# Patient Record
Sex: Male | Born: 1935
Health system: Southern US, Community
[De-identification: ages and names within clinical notes are randomized; demographics above are authoritative.]

## PROBLEM LIST (undated history)

## (undated) DIAGNOSIS — I509 Heart failure, unspecified: Secondary | ICD-10-CM

## (undated) DIAGNOSIS — J189 Pneumonia, unspecified organism: Secondary | ICD-10-CM

## (undated) DIAGNOSIS — I2510 Atherosclerotic heart disease of native coronary artery without angina pectoris: Secondary | ICD-10-CM

## (undated) DIAGNOSIS — J449 Chronic obstructive pulmonary disease, unspecified: Secondary | ICD-10-CM

## (undated) DIAGNOSIS — Z87891 Personal history of nicotine dependence: Secondary | ICD-10-CM

## (undated) DIAGNOSIS — I5032 Chronic diastolic (congestive) heart failure: Secondary | ICD-10-CM

## (undated) DIAGNOSIS — N400 Enlarged prostate without lower urinary tract symptoms: Secondary | ICD-10-CM

## (undated) DIAGNOSIS — J9611 Chronic respiratory failure with hypoxia: Secondary | ICD-10-CM

## (undated) DIAGNOSIS — R609 Edema, unspecified: Secondary | ICD-10-CM

## (undated) DIAGNOSIS — M1990 Unspecified osteoarthritis, unspecified site: Secondary | ICD-10-CM

## (undated) DIAGNOSIS — N189 Chronic kidney disease, unspecified: Secondary | ICD-10-CM

## (undated) DIAGNOSIS — F0390 Unspecified dementia without behavioral disturbance: Secondary | ICD-10-CM

## (undated) DIAGNOSIS — E782 Mixed hyperlipidemia: Secondary | ICD-10-CM

## (undated) DIAGNOSIS — G4730 Sleep apnea, unspecified: Secondary | ICD-10-CM

## (undated) DIAGNOSIS — I10 Essential (primary) hypertension: Secondary | ICD-10-CM

## (undated) HISTORY — DX: Mixed hyperlipidemia: E78.2

## (undated) HISTORY — DX: Edema, unspecified: R60.9

## (undated) HISTORY — DX: Chronic obstructive pulmonary disease, unspecified: J44.9

## (undated) HISTORY — DX: Benign prostatic hyperplasia without lower urinary tract symptoms: N40.0

## (undated) HISTORY — DX: Essential (primary) hypertension: I10

## (undated) HISTORY — DX: Personal history of nicotine dependence: Z87.891

## (undated) HISTORY — DX: Unspecified osteoarthritis, unspecified site: M19.90

## (undated) HISTORY — DX: Chronic kidney disease, unspecified: N18.9

## (undated) HISTORY — PX: ANGIOPLASTY: SHX39

## (undated) HISTORY — PX: CARDIAC CATHETERIZATION: SHX172

## (undated) HISTORY — DX: Chronic respiratory failure with hypoxia: J96.11

## (undated) HISTORY — DX: Chronic diastolic (congestive) heart failure: I50.32

## (undated) HISTORY — DX: Unspecified dementia, unspecified severity, without behavioral disturbance, psychotic disturbance, mood disturbance, and anxiety: F03.90

## (undated) HISTORY — DX: Sleep apnea, unspecified: G47.30

## (undated) HISTORY — DX: Atherosclerotic heart disease of native coronary artery without angina pectoris: I25.10

---

## 1999-11-17 ENCOUNTER — Other Ambulatory Visit: Admission: RE | Admit: 1999-11-17 | Discharge: 1999-11-17 | Payer: Self-pay | Admitting: Gastroenterology

## 1999-11-17 ENCOUNTER — Encounter (INDEPENDENT_AMBULATORY_CARE_PROVIDER_SITE_OTHER): Payer: Self-pay | Admitting: Specialist

## 2003-04-11 HISTORY — PX: ROTATOR CUFF REPAIR: SHX139

## 2003-09-22 ENCOUNTER — Observation Stay (HOSPITAL_COMMUNITY): Admission: RE | Admit: 2003-09-22 | Discharge: 2003-09-23 | Payer: Self-pay | Admitting: Orthopedic Surgery

## 2004-03-15 ENCOUNTER — Ambulatory Visit: Payer: Self-pay | Admitting: Internal Medicine

## 2005-01-30 ENCOUNTER — Ambulatory Visit: Payer: Self-pay | Admitting: Internal Medicine

## 2005-02-08 HISTORY — PX: CORONARY ARTERY BYPASS GRAFT: SHX141

## 2005-03-01 ENCOUNTER — Ambulatory Visit: Payer: Self-pay | Admitting: Internal Medicine

## 2005-03-03 ENCOUNTER — Inpatient Hospital Stay (HOSPITAL_COMMUNITY): Admission: EM | Admit: 2005-03-03 | Discharge: 2005-03-13 | Payer: Self-pay | Admitting: Emergency Medicine

## 2005-03-06 ENCOUNTER — Ambulatory Visit: Payer: Self-pay | Admitting: Cardiology

## 2005-03-29 ENCOUNTER — Ambulatory Visit: Payer: Self-pay | Admitting: Internal Medicine

## 2005-04-11 ENCOUNTER — Encounter: Payer: Self-pay | Admitting: Internal Medicine

## 2005-04-19 ENCOUNTER — Emergency Department (HOSPITAL_COMMUNITY): Admission: EM | Admit: 2005-04-19 | Discharge: 2005-04-20 | Payer: Self-pay | Admitting: Emergency Medicine

## 2005-05-04 ENCOUNTER — Encounter (HOSPITAL_COMMUNITY): Admission: RE | Admit: 2005-05-04 | Discharge: 2005-08-02 | Payer: Self-pay | Admitting: Cardiology

## 2005-05-24 ENCOUNTER — Ambulatory Visit: Payer: Self-pay | Admitting: Internal Medicine

## 2005-05-26 ENCOUNTER — Ambulatory Visit: Payer: Self-pay | Admitting: Internal Medicine

## 2005-08-08 DIAGNOSIS — J432 Centrilobular emphysema: Secondary | ICD-10-CM

## 2005-08-14 ENCOUNTER — Ambulatory Visit: Payer: Self-pay | Admitting: Internal Medicine

## 2005-08-15 ENCOUNTER — Ambulatory Visit: Payer: Self-pay | Admitting: Internal Medicine

## 2005-08-24 ENCOUNTER — Ambulatory Visit: Payer: Self-pay | Admitting: Internal Medicine

## 2005-09-05 ENCOUNTER — Ambulatory Visit: Payer: Self-pay | Admitting: Internal Medicine

## 2005-09-06 ENCOUNTER — Ambulatory Visit: Payer: Self-pay | Admitting: Emergency Medicine

## 2005-09-08 ENCOUNTER — Ambulatory Visit: Payer: Self-pay | Admitting: Cardiology

## 2005-09-15 ENCOUNTER — Ambulatory Visit: Payer: Self-pay | Admitting: Emergency Medicine

## 2005-09-26 ENCOUNTER — Encounter: Payer: Self-pay | Admitting: Internal Medicine

## 2005-10-03 ENCOUNTER — Ambulatory Visit: Payer: Self-pay | Admitting: Emergency Medicine

## 2005-10-05 ENCOUNTER — Ambulatory Visit: Payer: Self-pay | Admitting: Internal Medicine

## 2006-01-03 ENCOUNTER — Ambulatory Visit: Payer: Self-pay | Admitting: Internal Medicine

## 2006-01-08 ENCOUNTER — Ambulatory Visit: Payer: Self-pay | Admitting: Internal Medicine

## 2006-02-06 ENCOUNTER — Ambulatory Visit: Payer: Self-pay | Admitting: Internal Medicine

## 2006-05-16 ENCOUNTER — Ambulatory Visit: Payer: Self-pay | Admitting: Internal Medicine

## 2006-06-04 ENCOUNTER — Ambulatory Visit: Payer: Self-pay | Admitting: Internal Medicine

## 2006-06-12 ENCOUNTER — Ambulatory Visit: Payer: Self-pay | Admitting: Cardiology

## 2006-06-18 ENCOUNTER — Ambulatory Visit: Payer: Self-pay | Admitting: Internal Medicine

## 2006-07-06 DIAGNOSIS — E7800 Pure hypercholesterolemia, unspecified: Secondary | ICD-10-CM | POA: Insufficient documentation

## 2006-07-06 DIAGNOSIS — I10 Essential (primary) hypertension: Secondary | ICD-10-CM

## 2006-07-06 DIAGNOSIS — F068 Other specified mental disorders due to known physiological condition: Secondary | ICD-10-CM | POA: Insufficient documentation

## 2006-07-06 DIAGNOSIS — N400 Enlarged prostate without lower urinary tract symptoms: Secondary | ICD-10-CM | POA: Insufficient documentation

## 2006-07-06 DIAGNOSIS — H9190 Unspecified hearing loss, unspecified ear: Secondary | ICD-10-CM | POA: Insufficient documentation

## 2006-07-31 ENCOUNTER — Ambulatory Visit: Payer: Self-pay | Admitting: Internal Medicine

## 2006-07-31 LAB — CONVERTED CEMR LAB
ALT: 20 units/L (ref 0–40)
AST: 16 units/L (ref 0–37)
Albumin: 3.4 g/dL — ABNORMAL LOW (ref 3.5–5.2)
Alkaline Phosphatase: 75 units/L (ref 39–117)
BUN: 25 mg/dL — ABNORMAL HIGH (ref 6–23)
Basophils Absolute: 0 10*3/uL (ref 0.0–0.1)
Basophils Relative: 0.5 % (ref 0.0–1.0)
Bilirubin, Direct: 0.1 mg/dL (ref 0.0–0.3)
CO2: 32 meq/L (ref 19–32)
Calcium: 9.7 mg/dL (ref 8.4–10.5)
Chloride: 107 meq/L (ref 96–112)
Cholesterol: 167 mg/dL (ref 0–200)
Creatinine, Ser: 1.1 mg/dL (ref 0.4–1.5)
Eosinophils Absolute: 0.4 10*3/uL (ref 0.0–0.6)
Eosinophils Relative: 5.4 % — ABNORMAL HIGH (ref 0.0–5.0)
GFR calc Af Amer: 84 mL/min
GFR calc non Af Amer: 69 mL/min
Glucose, Bld: 129 mg/dL — ABNORMAL HIGH (ref 70–99)
HCT: 37 % — ABNORMAL LOW (ref 39.0–52.0)
HDL: 33.5 mg/dL — ABNORMAL LOW (ref >39.0)
Hemoglobin: 12.4 g/dL — ABNORMAL LOW (ref 13.0–17.0)
Hgb A1c MFr Bld: 8.5 % — ABNORMAL HIGH (ref 4.6–6.0)
LDL Cholesterol: 104 mg/dL — ABNORMAL HIGH (ref 0–99)
Lymphocytes Relative: 22.5 % (ref 12.0–46.0)
MCHC: 33.6 g/dL (ref 30.0–36.0)
MCV: 89.1 fL (ref 78.0–100.0)
Monocytes Absolute: 0.6 10*3/uL (ref 0.2–0.7)
Monocytes Relative: 9.1 % (ref 3.0–11.0)
Neutro Abs: 4.4 10*3/uL (ref 1.4–7.7)
Neutrophils Relative %: 62.5 % (ref 43.0–77.0)
Phosphorus: 3 mg/dL (ref 2.3–4.6)
Platelets: 235 10*3/uL (ref 150–400)
Potassium: 4.9 meq/L (ref 3.5–5.1)
RBC: 4.15 M/uL — ABNORMAL LOW (ref 4.22–5.81)
RDW: 13.2 % (ref 11.5–14.6)
Sed Rate: 87 mm/hr — ABNORMAL HIGH (ref 0–20)
Sodium: 142 meq/L (ref 135–145)
TSH: 1.17 microintl units/mL (ref 0.35–5.50)
Total Bilirubin: 0.4 mg/dL (ref 0.3–1.2)
Total CHOL/HDL Ratio: 5
Total CK: 51 units/L (ref 7–195)
Total Protein: 6.8 g/dL (ref 6.0–8.3)
Triglycerides: 148 mg/dL (ref 0–149)
VLDL: 30 mg/dL (ref 0–40)
WBC: 7 10*3/uL (ref 4.5–10.5)

## 2006-08-03 ENCOUNTER — Ambulatory Visit: Payer: Self-pay | Admitting: Internal Medicine

## 2006-08-03 LAB — CONVERTED CEMR LAB
Basophils Absolute: 0 10*3/uL (ref 0.0–0.1)
Basophils Relative: 0.5 % (ref 0.0–1.0)
Eosinophils Absolute: 0.4 10*3/uL (ref 0.0–0.6)
Eosinophils Relative: 5.1 % — ABNORMAL HIGH (ref 0.0–5.0)
Ferritin: 294.7 ng/mL (ref 22.0–322.0)
Folate: 9.6 ng/mL
HCT: 38.4 % — ABNORMAL LOW (ref 39.0–52.0)
Hemoglobin: 13 g/dL (ref 13.0–17.0)
Iron: 71 ug/dL (ref 42–165)
Lymphocytes Relative: 24.6 % (ref 12.0–46.0)
MCHC: 33.9 g/dL (ref 30.0–36.0)
MCV: 88.1 fL (ref 78.0–100.0)
Monocytes Absolute: 0.7 10*3/uL (ref 0.2–0.7)
Monocytes Relative: 9.7 % (ref 3.0–11.0)
Neutro Abs: 4.6 10*3/uL (ref 1.4–7.7)
Neutrophils Relative %: 60.1 % (ref 43.0–77.0)
Platelets: 246 10*3/uL (ref 150–400)
RBC: 4.36 M/uL (ref 4.22–5.81)
RDW: 13.8 % (ref 11.5–14.6)
Saturation Ratios: 26.1 % (ref 20.0–50.0)
Transferrin: 194.3 mg/dL — ABNORMAL LOW (ref >=212.0)
Vitamin B-12: 230 pg/mL (ref 211–911)
WBC: 7.5 10*3/uL (ref 4.5–10.5)

## 2006-08-06 ENCOUNTER — Encounter: Payer: Self-pay | Admitting: Cardiology

## 2006-08-06 ENCOUNTER — Ambulatory Visit: Payer: Self-pay

## 2006-08-30 ENCOUNTER — Ambulatory Visit: Payer: Self-pay | Admitting: Internal Medicine

## 2006-09-07 ENCOUNTER — Ambulatory Visit: Payer: Self-pay | Admitting: Emergency Medicine

## 2006-09-18 ENCOUNTER — Ambulatory Visit: Payer: Self-pay | Admitting: Pulmonary Disease

## 2006-09-25 ENCOUNTER — Ambulatory Visit: Payer: Self-pay | Admitting: Pulmonary Disease

## 2006-10-03 ENCOUNTER — Ambulatory Visit: Payer: Self-pay | Admitting: Emergency Medicine

## 2006-10-18 ENCOUNTER — Encounter (INDEPENDENT_AMBULATORY_CARE_PROVIDER_SITE_OTHER): Payer: Self-pay | Admitting: *Deleted

## 2006-11-23 ENCOUNTER — Telehealth (INDEPENDENT_AMBULATORY_CARE_PROVIDER_SITE_OTHER): Payer: Self-pay | Admitting: *Deleted

## 2006-12-24 ENCOUNTER — Ambulatory Visit: Payer: Self-pay | Admitting: Internal Medicine

## 2007-01-30 DIAGNOSIS — Z87891 Personal history of nicotine dependence: Secondary | ICD-10-CM

## 2007-01-30 DIAGNOSIS — J309 Allergic rhinitis, unspecified: Secondary | ICD-10-CM | POA: Insufficient documentation

## 2007-05-10 ENCOUNTER — Ambulatory Visit: Payer: Self-pay | Admitting: Internal Medicine

## 2007-05-10 DIAGNOSIS — I2510 Atherosclerotic heart disease of native coronary artery without angina pectoris: Secondary | ICD-10-CM | POA: Insufficient documentation

## 2007-05-14 ENCOUNTER — Telehealth: Payer: Self-pay | Admitting: Internal Medicine

## 2007-05-23 ENCOUNTER — Ambulatory Visit: Payer: Self-pay | Admitting: Emergency Medicine

## 2007-05-23 ENCOUNTER — Encounter: Payer: Self-pay | Admitting: Adult Health

## 2007-05-23 ENCOUNTER — Ambulatory Visit: Payer: Self-pay | Admitting: Pulmonary Disease

## 2007-07-02 ENCOUNTER — Ambulatory Visit: Payer: Self-pay | Admitting: Cardiology

## 2007-07-05 ENCOUNTER — Ambulatory Visit: Payer: Self-pay | Admitting: Emergency Medicine

## 2007-07-10 ENCOUNTER — Ambulatory Visit: Payer: Self-pay | Admitting: Internal Medicine

## 2007-07-10 ENCOUNTER — Ambulatory Visit: Payer: Self-pay | Admitting: Cardiology

## 2007-07-10 LAB — CONVERTED CEMR LAB
BUN: 21 mg/dL (ref 6–23)
CO2: 28 meq/L (ref 19–32)
Calcium: 9.2 mg/dL (ref 8.4–10.5)
Chloride: 106 meq/L (ref 96–112)
Creatinine, Ser: 1.5 mg/dL (ref 0.4–1.5)
GFR calc Af Amer: 58 mL/min
GFR calc non Af Amer: 48 mL/min
Glucose, Bld: 173 mg/dL — ABNORMAL HIGH (ref 70–99)
Hgb A1c MFr Bld: 7.3 % — ABNORMAL HIGH (ref 4.6–6.0)
Potassium: 4.4 meq/L (ref 3.5–5.1)
Sodium: 139 meq/L (ref 135–145)

## 2007-09-04 ENCOUNTER — Ambulatory Visit: Payer: Self-pay | Admitting: Internal Medicine

## 2007-09-04 DIAGNOSIS — R42 Dizziness and giddiness: Secondary | ICD-10-CM

## 2007-09-04 DIAGNOSIS — E1121 Type 2 diabetes mellitus with diabetic nephropathy: Secondary | ICD-10-CM | POA: Insufficient documentation

## 2007-12-13 ENCOUNTER — Telehealth (INDEPENDENT_AMBULATORY_CARE_PROVIDER_SITE_OTHER): Payer: Self-pay | Admitting: *Deleted

## 2007-12-17 ENCOUNTER — Ambulatory Visit: Payer: Self-pay | Admitting: Family Medicine

## 2007-12-17 DIAGNOSIS — R5383 Other fatigue: Secondary | ICD-10-CM

## 2007-12-17 DIAGNOSIS — R5381 Other malaise: Secondary | ICD-10-CM

## 2007-12-25 ENCOUNTER — Ambulatory Visit: Payer: Self-pay | Admitting: Family Medicine

## 2007-12-25 LAB — CONVERTED CEMR LAB
ALT: 17 units/L (ref 0–53)
AST: 15 units/L (ref 0–37)
Albumin: 3.8 g/dL (ref 3.5–5.2)
Alkaline Phosphatase: 49 units/L (ref 39–117)
BUN: 34 mg/dL — ABNORMAL HIGH (ref 6–23)
Basophils Absolute: 0.2 10*3/uL — ABNORMAL HIGH (ref 0.0–0.1)
Basophils Relative: 2.2 % (ref 0.0–3.0)
Bilirubin, Direct: 0.1 mg/dL (ref 0.0–0.3)
CO2: 29 meq/L (ref 19–32)
Calcium: 9.4 mg/dL (ref 8.4–10.5)
Chloride: 115 meq/L — ABNORMAL HIGH (ref 96–112)
Creatinine, Ser: 1.5 mg/dL (ref 0.4–1.5)
Eosinophils Absolute: 0.2 10*3/uL (ref 0.0–0.7)
Eosinophils Relative: 2.1 % (ref 0.0–5.0)
GFR calc Af Amer: 58 mL/min
GFR calc non Af Amer: 48 mL/min
Glucose, Bld: 108 mg/dL — ABNORMAL HIGH (ref 70–99)
HCT: 36.2 % — ABNORMAL LOW (ref 39.0–52.0)
Hemoglobin: 12.3 g/dL — ABNORMAL LOW (ref 13.0–17.0)
Hgb A1c MFr Bld: 7.1 % — ABNORMAL HIGH (ref 4.6–6.0)
Lymphocytes Relative: 15.1 % (ref 12.0–46.0)
MCHC: 33.9 g/dL (ref 30.0–36.0)
MCV: 93.5 fL (ref 78.0–100.0)
Monocytes Absolute: 0.4 10*3/uL (ref 0.1–1.0)
Monocytes Relative: 5.3 % (ref 3.0–12.0)
Neutro Abs: 5.7 10*3/uL (ref 1.4–7.7)
Neutrophils Relative %: 75.3 % (ref 43.0–77.0)
PSA: 3.33 ng/mL (ref 0.10–4.00)
Platelets: 215 10*3/uL (ref 150–400)
Potassium: 5.7 meq/L — ABNORMAL HIGH (ref 3.5–5.1)
RBC: 3.87 M/uL — ABNORMAL LOW (ref 4.22–5.81)
RDW: 13.4 % (ref 11.5–14.6)
Sodium: 143 meq/L (ref 135–145)
Total Bilirubin: 0.5 mg/dL (ref 0.3–1.2)
Total Protein: 6.5 g/dL (ref 6.0–8.3)
WBC: 7.7 10*3/uL (ref 4.5–10.5)

## 2007-12-26 LAB — CONVERTED CEMR LAB
BUN: 31 mg/dL — ABNORMAL HIGH (ref 6–23)
CO2: 26 meq/L (ref 19–32)
Calcium: 8.9 mg/dL (ref 8.4–10.5)
Chloride: 112 meq/L (ref 96–112)
Creatinine, Ser: 1.4 mg/dL (ref 0.4–1.5)
GFR calc Af Amer: 63 mL/min
GFR calc non Af Amer: 52 mL/min
Glucose, Bld: 101 mg/dL — ABNORMAL HIGH (ref 70–99)
Potassium: 4.7 meq/L (ref 3.5–5.1)
Sodium: 141 meq/L (ref 135–145)

## 2008-02-07 ENCOUNTER — Telehealth (INDEPENDENT_AMBULATORY_CARE_PROVIDER_SITE_OTHER): Payer: Self-pay | Admitting: *Deleted

## 2008-02-12 ENCOUNTER — Telehealth: Payer: Self-pay | Admitting: Emergency Medicine

## 2008-03-09 ENCOUNTER — Telehealth (INDEPENDENT_AMBULATORY_CARE_PROVIDER_SITE_OTHER): Payer: Self-pay | Admitting: *Deleted

## 2008-03-31 ENCOUNTER — Encounter: Payer: Self-pay | Admitting: Family Medicine

## 2008-04-09 ENCOUNTER — Encounter
Admission: RE | Admit: 2008-04-09 | Discharge: 2008-04-09 | Payer: Self-pay | Admitting: Physical Medicine and Rehabilitation

## 2008-04-17 ENCOUNTER — Telehealth (INDEPENDENT_AMBULATORY_CARE_PROVIDER_SITE_OTHER): Payer: Self-pay | Admitting: *Deleted

## 2008-04-23 ENCOUNTER — Encounter: Payer: Self-pay | Admitting: Family Medicine

## 2008-05-01 ENCOUNTER — Ambulatory Visit: Payer: Self-pay | Admitting: Cardiovascular Disease

## 2008-05-01 LAB — CONVERTED CEMR LAB
BUN: 30 mg/dL — ABNORMAL HIGH (ref 6–23)
Basophils Absolute: 0 10*3/uL (ref 0.0–0.1)
Basophils Relative: 0.2 % (ref 0.0–3.0)
CO2: 25 meq/L (ref 19–32)
Calcium: 9.2 mg/dL (ref 8.4–10.5)
Chloride: 105 meq/L (ref 96–112)
Creatinine, Ser: 1.3 mg/dL (ref 0.4–1.5)
Eosinophils Absolute: 0.2 10*3/uL (ref 0.0–0.7)
Eosinophils Relative: 3.8 % (ref 0.0–5.0)
GFR calc Af Amer: 69 mL/min
GFR calc non Af Amer: 57 mL/min
Glucose, Bld: 148 mg/dL — ABNORMAL HIGH (ref 70–99)
HCT: 40.2 % (ref 39.0–52.0)
Hemoglobin: 13.4 g/dL (ref 13.0–17.0)
INR: 1 (ref 0.8–1.0)
Lymphocytes Relative: 13.2 % (ref 12.0–46.0)
MCHC: 33.3 g/dL (ref 30.0–36.0)
MCV: 93.9 fL (ref 78.0–100.0)
Monocytes Absolute: 0.7 10*3/uL (ref 0.1–1.0)
Monocytes Relative: 10.9 % (ref 3.0–12.0)
Neutro Abs: 4.3 10*3/uL (ref 1.4–7.7)
Neutrophils Relative %: 71.9 % (ref 43.0–77.0)
Platelets: 176 10*3/uL (ref 150–400)
Potassium: 5.4 meq/L — ABNORMAL HIGH (ref 3.5–5.1)
Pro B Natriuretic peptide (BNP): 103 pg/mL — ABNORMAL HIGH (ref 0.0–100.0)
Prothrombin Time: 11.3 s (ref 10.9–13.3)
RBC: 4.29 M/uL (ref 4.22–5.81)
RDW: 12.6 % (ref 11.5–14.6)
Sodium: 136 meq/L (ref 135–145)
WBC: 6 10*3/uL (ref 4.5–10.5)

## 2008-05-07 ENCOUNTER — Ambulatory Visit: Payer: Self-pay | Admitting: Family Medicine

## 2008-05-15 ENCOUNTER — Ambulatory Visit: Payer: Self-pay | Admitting: Cardiology

## 2008-05-15 ENCOUNTER — Inpatient Hospital Stay (HOSPITAL_BASED_OUTPATIENT_CLINIC_OR_DEPARTMENT_OTHER): Admission: RE | Admit: 2008-05-15 | Discharge: 2008-05-15 | Payer: Self-pay | Admitting: Cardiology

## 2008-05-20 ENCOUNTER — Ambulatory Visit: Payer: Self-pay | Admitting: Cardiology

## 2008-05-20 LAB — CONVERTED CEMR LAB
BUN: 26 mg/dL — ABNORMAL HIGH (ref 6–23)
CO2: 27 meq/L (ref 19–32)
Calcium: 9.4 mg/dL (ref 8.4–10.5)
Chloride: 109 meq/L (ref 96–112)
Creatinine, Ser: 1.2 mg/dL (ref 0.4–1.5)
GFR calc Af Amer: 75 mL/min
GFR calc non Af Amer: 62 mL/min
Glucose, Bld: 211 mg/dL — ABNORMAL HIGH (ref 70–99)
Potassium: 4.8 meq/L (ref 3.5–5.1)
Sodium: 139 meq/L (ref 135–145)

## 2008-06-07 DIAGNOSIS — I25810 Atherosclerosis of coronary artery bypass graft(s) without angina pectoris: Secondary | ICD-10-CM

## 2008-06-08 ENCOUNTER — Encounter: Payer: Self-pay | Admitting: Cardiology

## 2008-06-08 ENCOUNTER — Ambulatory Visit: Payer: Self-pay | Admitting: Cardiology

## 2008-06-08 DIAGNOSIS — R0602 Shortness of breath: Secondary | ICD-10-CM | POA: Insufficient documentation

## 2008-06-09 ENCOUNTER — Ambulatory Visit: Payer: Self-pay | Admitting: Emergency Medicine

## 2008-06-11 ENCOUNTER — Ambulatory Visit: Payer: Self-pay | Admitting: Family Medicine

## 2008-06-11 DIAGNOSIS — E782 Mixed hyperlipidemia: Secondary | ICD-10-CM

## 2008-06-11 LAB — CONVERTED CEMR LAB
ALT: 40 units/L (ref 0–53)
AST: 20 units/L (ref 0–37)
Albumin: 3 g/dL — ABNORMAL LOW (ref 3.5–5.2)
Alkaline Phosphatase: 56 units/L (ref 39–117)
Bilirubin, Direct: 0.1 mg/dL (ref 0.0–0.3)
Cholesterol: 124 mg/dL (ref 0–200)
HDL: 25 mg/dL — ABNORMAL LOW (ref >39.0)
LDL Cholesterol: 77 mg/dL (ref 0–99)
Total Bilirubin: 0.5 mg/dL (ref 0.3–1.2)
Total CHOL/HDL Ratio: 5
Total Protein: 7 g/dL (ref 6.0–8.3)
Triglycerides: 110 mg/dL (ref 0–149)
VLDL: 22 mg/dL (ref 0–40)

## 2008-06-22 ENCOUNTER — Telehealth (INDEPENDENT_AMBULATORY_CARE_PROVIDER_SITE_OTHER): Payer: Self-pay | Admitting: *Deleted

## 2008-06-24 ENCOUNTER — Telehealth: Payer: Self-pay | Admitting: Family Medicine

## 2008-08-18 ENCOUNTER — Telehealth: Payer: Self-pay | Admitting: Internal Medicine

## 2008-08-25 ENCOUNTER — Ambulatory Visit: Payer: Self-pay | Admitting: Family Medicine

## 2008-08-25 DIAGNOSIS — R609 Edema, unspecified: Secondary | ICD-10-CM

## 2008-08-26 LAB — CONVERTED CEMR LAB
BUN: 29 mg/dL — ABNORMAL HIGH (ref 6–23)
CO2: 31 meq/L (ref 19–32)
Calcium: 9.7 mg/dL (ref 8.4–10.5)
Chloride: 109 meq/L (ref 96–112)
Creatinine, Ser: 1.3 mg/dL (ref 0.4–1.5)
GFR calc non Af Amer: 56.7 mL/min (ref >60)
Glucose, Bld: 166 mg/dL — ABNORMAL HIGH (ref 70–99)
Hgb A1c MFr Bld: 7.4 % — ABNORMAL HIGH (ref 4.6–6.5)
Potassium: 5.4 meq/L — ABNORMAL HIGH (ref 3.5–5.1)
Sodium: 142 meq/L (ref 135–145)

## 2008-08-28 ENCOUNTER — Telehealth: Payer: Self-pay | Admitting: Family Medicine

## 2008-09-14 ENCOUNTER — Telehealth (INDEPENDENT_AMBULATORY_CARE_PROVIDER_SITE_OTHER): Payer: Self-pay | Admitting: *Deleted

## 2008-11-10 ENCOUNTER — Telehealth: Payer: Self-pay | Admitting: Cardiology

## 2008-11-11 ENCOUNTER — Ambulatory Visit: Payer: Self-pay | Admitting: Family Medicine

## 2008-11-12 LAB — CONVERTED CEMR LAB
ALT: 16 units/L (ref 0–53)
AST: 16 units/L (ref 0–37)
Albumin: 3.8 g/dL (ref 3.5–5.2)
Alkaline Phosphatase: 57 units/L (ref 39–117)
BUN: 23 mg/dL (ref 6–23)
Basophils Absolute: 0.1 10*3/uL (ref 0.0–0.1)
Basophils Relative: 0.8 % (ref 0.0–3.0)
Bilirubin, Direct: 0.1 mg/dL (ref 0.0–0.3)
CO2: 31 meq/L (ref 19–32)
Calcium: 9.3 mg/dL (ref 8.4–10.5)
Chloride: 106 meq/L (ref 96–112)
Creatinine, Ser: 1.4 mg/dL (ref 0.4–1.5)
Eosinophils Absolute: 0.2 10*3/uL (ref 0.0–0.7)
Eosinophils Relative: 3.1 % (ref 0.0–5.0)
GFR calc non Af Amer: 52.02 mL/min (ref >60)
Glucose, Bld: 94 mg/dL (ref 70–99)
HCT: 35.8 % — ABNORMAL LOW (ref 39.0–52.0)
Hemoglobin: 12.1 g/dL — ABNORMAL LOW (ref 13.0–17.0)
Hgb A1c MFr Bld: 7.9 % — ABNORMAL HIGH (ref 4.6–6.5)
Lymphocytes Relative: 20.4 % (ref 12.0–46.0)
Lymphs Abs: 1.4 10*3/uL (ref 0.7–4.0)
MCHC: 33.9 g/dL (ref 30.0–36.0)
MCV: 91.8 fL (ref 78.0–100.0)
Monocytes Absolute: 0.7 10*3/uL (ref 0.1–1.0)
Monocytes Relative: 10.1 % (ref 3.0–12.0)
Neutro Abs: 4.6 10*3/uL (ref 1.4–7.7)
Neutrophils Relative %: 65.6 % (ref 43.0–77.0)
Platelets: 171 10*3/uL (ref 150.0–400.0)
Potassium: 4.2 meq/L (ref 3.5–5.1)
Pro B Natriuretic peptide (BNP): 74 pg/mL (ref 0.0–100.0)
RBC: 3.9 M/uL — ABNORMAL LOW (ref 4.22–5.81)
RDW: 12.8 % (ref 11.5–14.6)
Sodium: 142 meq/L (ref 135–145)
Total Bilirubin: 0.7 mg/dL (ref 0.3–1.2)
Total Protein: 6.9 g/dL (ref 6.0–8.3)
WBC: 7 10*3/uL (ref 4.5–10.5)

## 2008-11-17 ENCOUNTER — Encounter: Payer: Self-pay | Admitting: Cardiovascular Disease

## 2008-11-17 ENCOUNTER — Ambulatory Visit: Payer: Self-pay

## 2008-11-18 ENCOUNTER — Ambulatory Visit: Payer: Self-pay

## 2008-11-18 ENCOUNTER — Encounter: Payer: Self-pay | Admitting: Family Medicine

## 2008-12-10 ENCOUNTER — Telehealth: Payer: Self-pay | Admitting: Emergency Medicine

## 2008-12-10 ENCOUNTER — Telehealth: Payer: Self-pay | Admitting: Family Medicine

## 2008-12-21 ENCOUNTER — Ambulatory Visit: Payer: Self-pay | Admitting: Family Medicine

## 2008-12-22 ENCOUNTER — Telehealth: Payer: Self-pay | Admitting: Family Medicine

## 2009-01-12 ENCOUNTER — Telehealth: Payer: Self-pay | Admitting: Emergency Medicine

## 2009-02-16 ENCOUNTER — Telehealth: Payer: Self-pay | Admitting: Emergency Medicine

## 2009-03-09 ENCOUNTER — Telehealth: Payer: Self-pay | Admitting: Adult Health

## 2009-04-19 ENCOUNTER — Ambulatory Visit: Payer: Self-pay | Admitting: Emergency Medicine

## 2009-08-12 ENCOUNTER — Ambulatory Visit: Payer: Self-pay | Admitting: Family Medicine

## 2009-08-12 ENCOUNTER — Encounter: Payer: Self-pay | Admitting: Emergency Medicine

## 2009-08-16 ENCOUNTER — Encounter: Payer: Self-pay | Admitting: Emergency Medicine

## 2009-08-16 LAB — CONVERTED CEMR LAB
ALT: 17 units/L (ref 0–53)
AST: 15 units/L (ref 0–37)
Albumin: 4 g/dL (ref 3.5–5.2)
Alkaline Phosphatase: 57 units/L (ref 39–117)
BUN: 49 mg/dL — ABNORMAL HIGH (ref 6–23)
Basophils Absolute: 0 10*3/uL (ref 0.0–0.1)
Basophils Relative: 0.1 % (ref 0.0–3.0)
Bilirubin, Direct: 0 mg/dL (ref 0.0–0.3)
CO2: 29 meq/L (ref 19–32)
Calcium: 9.7 mg/dL (ref 8.4–10.5)
Chloride: 102 meq/L (ref 96–112)
Creatinine, Ser: 2 mg/dL — ABNORMAL HIGH (ref 0.4–1.5)
Creatinine,U: 64.1 mg/dL
Eosinophils Absolute: 0.2 10*3/uL (ref 0.0–0.7)
Eosinophils Relative: 2.2 % (ref 0.0–5.0)
GFR calc non Af Amer: 34.01 mL/min (ref >60)
Glucose, Bld: 108 mg/dL — ABNORMAL HIGH (ref 70–99)
HCT: 35.9 % — ABNORMAL LOW (ref 39.0–52.0)
Hemoglobin: 12.1 g/dL — ABNORMAL LOW (ref 13.0–17.0)
Hgb A1c MFr Bld: 7.3 % — ABNORMAL HIGH (ref 4.6–6.5)
Lymphocytes Relative: 16.3 % (ref 12.0–46.0)
Lymphs Abs: 1.3 10*3/uL (ref 0.7–4.0)
MCHC: 33.8 g/dL (ref 30.0–36.0)
MCV: 92.3 fL (ref 78.0–100.0)
Microalb Creat Ratio: 0.6 mg/g (ref 0.0–30.0)
Microalb, Ur: 0.4 mg/dL (ref 0.0–1.9)
Monocytes Absolute: 0.7 10*3/uL (ref 0.1–1.0)
Monocytes Relative: 8.6 % (ref 3.0–12.0)
Neutro Abs: 5.8 10*3/uL (ref 1.4–7.7)
Neutrophils Relative %: 72.8 % (ref 43.0–77.0)
Platelets: 193 10*3/uL (ref 150.0–400.0)
Potassium: 5.2 meq/L — ABNORMAL HIGH (ref 3.5–5.1)
RBC: 3.89 M/uL — ABNORMAL LOW (ref 4.22–5.81)
RDW: 13.5 % (ref 11.5–14.6)
Sodium: 140 meq/L (ref 135–145)
Total Bilirubin: 0.5 mg/dL (ref 0.3–1.2)
Total Protein: 6.8 g/dL (ref 6.0–8.3)
WBC: 7.9 10*3/uL (ref 4.5–10.5)

## 2009-08-18 ENCOUNTER — Ambulatory Visit: Payer: Self-pay | Admitting: Cardiology

## 2009-08-18 DIAGNOSIS — I5032 Chronic diastolic (congestive) heart failure: Secondary | ICD-10-CM

## 2009-08-19 ENCOUNTER — Ambulatory Visit: Payer: Self-pay | Admitting: Family Medicine

## 2009-08-20 ENCOUNTER — Telehealth (INDEPENDENT_AMBULATORY_CARE_PROVIDER_SITE_OTHER): Payer: Self-pay | Admitting: *Deleted

## 2009-08-20 LAB — CONVERTED CEMR LAB
BUN: 29 mg/dL — ABNORMAL HIGH (ref 6–23)
CO2: 27 meq/L (ref 19–32)
Calcium: 9.5 mg/dL (ref 8.4–10.5)
Chloride: 107 meq/L (ref 96–112)
Cholesterol: 130 mg/dL (ref 0–200)
Creatinine, Ser: 1.4 mg/dL (ref 0.4–1.5)
GFR calc non Af Amer: 53.23 mL/min (ref >60)
Glucose, Bld: 88 mg/dL (ref 70–99)
HDL: 35.8 mg/dL — ABNORMAL LOW (ref >39.00)
LDL Cholesterol: 73 mg/dL (ref 0–99)
Potassium: 5 meq/L (ref 3.5–5.1)
Sodium: 142 meq/L (ref 135–145)
Total CHOL/HDL Ratio: 4
Triglycerides: 108 mg/dL (ref 0.0–149.0)
VLDL: 21.6 mg/dL (ref 0.0–40.0)

## 2009-12-01 ENCOUNTER — Telehealth: Payer: Self-pay | Admitting: Cardiology

## 2009-12-06 ENCOUNTER — Ambulatory Visit: Payer: Self-pay | Admitting: Emergency Medicine

## 2009-12-06 DIAGNOSIS — M1990 Unspecified osteoarthritis, unspecified site: Secondary | ICD-10-CM | POA: Insufficient documentation

## 2009-12-07 ENCOUNTER — Ambulatory Visit: Payer: Self-pay | Admitting: Family Medicine

## 2009-12-10 ENCOUNTER — Ambulatory Visit: Payer: Self-pay | Admitting: Emergency Medicine

## 2010-01-06 ENCOUNTER — Telehealth: Payer: Self-pay | Admitting: Emergency Medicine

## 2010-05-01 ENCOUNTER — Encounter: Payer: Self-pay | Admitting: Thoracic Surgery (Cardiothoracic Vascular Surgery)

## 2010-05-04 ENCOUNTER — Telehealth (INDEPENDENT_AMBULATORY_CARE_PROVIDER_SITE_OTHER): Payer: Self-pay | Admitting: *Deleted

## 2010-05-04 ENCOUNTER — Ambulatory Visit
Admission: RE | Admit: 2010-05-04 | Discharge: 2010-05-04 | Payer: Self-pay | Source: Home / Self Care | Attending: Family Medicine | Admitting: Family Medicine

## 2010-05-04 DIAGNOSIS — J441 Chronic obstructive pulmonary disease with (acute) exacerbation: Secondary | ICD-10-CM | POA: Insufficient documentation

## 2010-05-05 ENCOUNTER — Encounter: Payer: Self-pay | Admitting: Family Medicine

## 2010-05-06 ENCOUNTER — Ambulatory Visit
Admission: RE | Admit: 2010-05-06 | Discharge: 2010-05-06 | Payer: Self-pay | Source: Home / Self Care | Attending: Cardiology | Admitting: Cardiology

## 2010-05-06 ENCOUNTER — Encounter: Payer: Self-pay | Admitting: Cardiology

## 2010-05-08 LAB — CONVERTED CEMR LAB
ALT: 13 units/L (ref 0–53)
AST: 10 units/L (ref 0–37)
Albumin: 3 g/dL — ABNORMAL LOW (ref 3.5–5.2)
Alkaline Phosphatase: 50 units/L (ref 39–117)
BUN: 20 mg/dL (ref 6–23)
Basophils Absolute: 0 10*3/uL (ref 0.0–0.1)
Basophils Relative: 0.1 % (ref 0.0–1.0)
Bilirubin, Direct: 0.1 mg/dL (ref 0.0–0.3)
CO2: 27 meq/L (ref 19–32)
Calcium: 9 mg/dL (ref 8.4–10.5)
Chloride: 102 meq/L (ref 96–112)
Cholesterol: 121 mg/dL (ref 0–200)
Creatinine, Ser: 1.4 mg/dL (ref 0.4–1.5)
Eosinophils Absolute: 0.2 10*3/uL (ref 0.0–0.6)
Eosinophils Relative: 3.1 % (ref 0.0–5.0)
GFR calc Af Amer: 63 mL/min
GFR calc non Af Amer: 52 mL/min
Glucose, Bld: 254 mg/dL — ABNORMAL HIGH (ref 70–99)
HCT: 34.3 % — ABNORMAL LOW (ref 39.0–52.0)
HDL: 28.4 mg/dL — ABNORMAL LOW (ref >39.0)
Hemoglobin: 11.3 g/dL — ABNORMAL LOW (ref 13.0–17.0)
Hgb A1c MFr Bld: 9.7 % — ABNORMAL HIGH (ref 4.6–6.0)
LDL Cholesterol: 78 mg/dL (ref 0–99)
Lymphocytes Relative: 12.7 % (ref 12.0–46.0)
MCHC: 33 g/dL (ref 30.0–36.0)
MCV: 93.1 fL (ref 78.0–100.0)
Monocytes Absolute: 1 10*3/uL — ABNORMAL HIGH (ref 0.2–0.7)
Monocytes Relative: 12.9 % — ABNORMAL HIGH (ref 3.0–11.0)
Neutro Abs: 5.3 10*3/uL (ref 1.4–7.7)
Neutrophils Relative %: 71.2 % (ref 43.0–77.0)
Phosphorus: 2.2 mg/dL — ABNORMAL LOW (ref 2.3–4.6)
Platelets: 243 10*3/uL (ref 150–400)
Potassium: 5 meq/L (ref 3.5–5.1)
RBC: 3.69 M/uL — ABNORMAL LOW (ref 4.22–5.81)
RDW: 11.9 % (ref 11.5–14.6)
Sodium: 136 meq/L (ref 135–145)
TSH: 1.1 microintl units/mL (ref 0.35–5.50)
Total Bilirubin: 0.6 mg/dL (ref 0.3–1.2)
Total CHOL/HDL Ratio: 4.3
Total Protein: 6.8 g/dL (ref 6.0–8.3)
Triglycerides: 74 mg/dL (ref 0–149)
VLDL: 15 mg/dL (ref 0–40)
WBC: 7.4 10*3/uL (ref 4.5–10.5)

## 2010-05-09 LAB — CONVERTED CEMR LAB
BUN: 35 mg/dL — ABNORMAL HIGH (ref 6–23)
CO2: 24 meq/L (ref 19–32)
Calcium: 9.7 mg/dL (ref 8.4–10.5)
Chloride: 106 meq/L (ref 96–112)
Creatinine, Ser: 1.34 mg/dL (ref 0.40–1.50)
Glucose, Bld: 164 mg/dL — ABNORMAL HIGH (ref 70–99)
Potassium: 5.4 meq/L — ABNORMAL HIGH (ref 3.5–5.3)
Pro B Natriuretic peptide (BNP): 134.9 pg/mL — ABNORMAL HIGH (ref 0.0–100.0)
Sodium: 140 meq/L (ref 135–145)

## 2010-05-12 NOTE — Progress Notes (Signed)
Summary: clarification needed on prednisone  Phone Note From Pharmacy   Caller: [facility]* Summary of Call: Pharmacist is calling to clarify the script for prednisone, regarding daily dosing and quantity.  Please advise on whether 2 twice daily is correct and adjust quantity. Initial call taken by: Lowella Petties CMA, AAMA,  May 04, 2010 4:13 PM  Follow-up for Phone Call        as below  thank them for catching that error. call  Hannah Beat MD  May 04, 2010 4:21 PM   Additional Follow-up for Phone Call Additional follow up Details #1::        Pharmacist called Additional Follow-up by: Benny Lennert CMA Duncan Dull),  May 04, 2010 4:44 PM    New/Updated Medications: PREDNISONE 20 MG TABS (PREDNISONE) 2 tabs by mouth one times a day for 5 days, then 1 tab by mouth daily for 5 days

## 2010-05-12 NOTE — Medication Information (Signed)
Summary: [facility] Symbicort[facility]  [facility] Symb[facility]   Imported By: Sherian Rein 08/16/2009 14:25:36  _____________________________________________________________________  External Attachment:    Type:   Image     Comment:   External Document

## 2010-05-12 NOTE — Assessment & Plan Note (Signed)
Summary: KNOT ON LEG/DLO   Vital Signs:  Patient profile:   77 year old male Height:      67.5 inches Weight:      197.6 pounds BMI:     30.60 Temp:     97.3 degrees F oral Pulse rate:   76 / minute Pulse rhythm:   regular BP sitting:   110 / 60  (left arm) Cuff size:   regular  Vitals Entered By: Benny Lennert CMA Duncan Dull) (Aug 12, 2009 3:18 PM)  History of Present Illness: Chief complaint knot on leg   77 year old:  DM: sugar running around 90- 114. Tol meds well  tired  having some dizzy spells. usually happens about mid-day -- after he is taking his lasix.  Swelling is dramatically improved  BP OK then, compliant  04/2009 eye exam  Allergies: 1)  ! Coumadin 2)  ! Codeine 3)  ! Ace Inhibitors 4)  Lipitor (Atorvastatin Calcium) 5)  Ticlid 6)  Morphine Sulfate (Morphine Sulfate)  Past History:  Past medical, surgical, family and social histories (including risk factors) reviewed, and no changes noted (except as noted below).  Past Medical History: Reviewed history from 06/07/2008 and no changes required.  Benign prostatic hypertrophy  Hypertension Dementia  Allergic rhinitis  Hyperlipidemia  Coronary artery disease----------------------------------[name]         S/P CABG 2006 with patent grafts 04/2008  COPD--------------------------------------------------------Dr Byrum  Dementia--3/08  Hearing loss L>>R Diabetes mellitus, type II  Ortho= [facility], Dr. Darrelyn Hillock  Past Surgical History: Reviewed history from 05/10/2007 and no changes required. Angioplasty  1991 & 1996 Rotator cuff (L) Gioffre 2005 CABG 11/06 Echo 4/08---EF-55%, mild LAE  Family History: Reviewed history from 07/06/2006 and no changes required. Dad died@46 --accident Mom died @54 --MI 3 brothers--1 suicide 6 sisters--1 died lung cancer No HTN,DM No prostate or colon cancer  Social History: Reviewed history from 07/06/2006 and no changes required. Retired--rubber  fabrication Married--1 son Former Smoker--long time then quit 2006 Alcohol use-no Enjoys golf and yard work  Review of Systems  The patient denies fever and syncope.         dizzy, fatigued, no polyuria.   Physical Exam  Additional Exam:  GEN: WDWN, NAD, Non-toxic, A & O x 3 HEENT: Atraumatic, Normocephalic. Neck supple. No masses, No LAD. Ears and Nose: No external deformity. CV: RRR, No M/G/R. No JVD. No thrill. No extra heart sounds. PULM: CTA B, scattered wheezes and decreased BS. No resp. distress. No accessory muscle use. ABD: S, NT, ND, +BS. No rebound tenderness. No HSM.  EXTR: No c/c/e NEURO: Normal gait.  PSYCH: Normally interactive. Conversant. Not depressed or anxious appearing.  Calm demeanor.     Impression & Recommendations:  Problem # 1:  DIZZINESS (ICD-780.4) Assessment New Suspicious for overdiuresis. Decrease Lasix dosing to 20 mg a day  Problem # 2:  EDEMA- LOCALIZED (ICD-782.3) Assessment: Improved  His updated medication list for this problem includes:    Furosemide 40 Mg Tabs (Furosemide) .[name]... 1 a day  Problem # 3:  DIABETES MELLITUS, TYPE II (ICD-250.00)  His updated medication list for this problem includes:    Glipizide 10 Mg Tb24 (Glipizide) .[name]... Take 1 tablet by mouth once a day    Adult Aspirin Low Strength 81 Mg Tbdp (Aspirin) .[name]... Take 1 tablet by mouth once a day    Metformin Hcl 1000 Mg Tabs (Metformin hcl) .[name]... 1 two times a day    Cozaar 50 Mg Tabs (Losartan potassium) .Marland KitchenMarland KitchenMarland KitchenMarland Kitchen  Take one tablet daily  Orders: Venipuncture (16109) TLB-BMP (Basic Metabolic Panel-BMET) (80048-METABOL) TLB-CBC Platelet - w/Differential (85025-CBCD) TLB-Hepatic/Liver Function Pnl (80076-HEPATIC) TLB-A1C / Hgb A1C (Glycohemoglobin) (83036-A1C) TLB-Microalbumin/Creat Ratio, Urine (82043-MALB)  Problem # 4:  HYPERTENSION (ICD-401.9)  His updated medication list for this problem includes:    Metoprolol Tartrate 25 Mg Tabs (Metoprolol tartrate) .[name]...  Take 1 tablet by mouth twice a day    Furosemide 40 Mg Tabs (Furosemide) .[name]... 1 a day    Doxazosin Mesylate 4 Mg Tabs (Doxazosin mesylate) .[name]... 1-2 tabs daily as directed    Cozaar 50 Mg Tabs (Losartan potassium) .[name]... Take one tablet daily  Complete Medication List: 1)  Glipizide 10 Mg Tb24 (Glipizide) .... Take 1 tablet by mouth once a day 2)  Metoprolol Tartrate 25 Mg Tabs (Metoprolol tartrate) .... Take 1 tablet by mouth twice a day 3)  Adult Aspirin Low Strength 81 Mg Tbdp (Aspirin) .... Take 1 tablet by mouth once a day 4)  Simvastatin 40 Mg Tabs (Simvastatin) .... Take 1 tablet by mouth once a day 5)  Symbicort 160-4.5 Mcg/act Aero (Budesonide-formoterol fumarate) .... Inhale 2 puffs two times a day 6)  Metformin Hcl 1000 Mg Tabs (Metformin hcl) .[name].. 1 two times a day 7)  Spiriva Handihaler 18 Mcg Caps (Tiotropium bromide monohydrate) .... Two puffs in handihaler daily 8)  Hydrocodone-acetaminophen 10-325 Mg Tabs (Hydrocodone-acetaminophen) .... Take as directed for pain needed 9)  Furosemide 40 Mg Tabs (Furosemide) .[name].. 1 a day 10)  Doxazosin Mesylate 4 Mg Tabs (Doxazosin mesylate) .[name].. 1-2 tabs daily as directed 11)  Cozaar 50 Mg Tabs (Losartan potassium) .... Take one tablet daily  Patient Instructions: 1)  CUT LASIX IN HALF TO 20 MG A DAY 2)  IT IS OK IF HAVING A LOT OF SWELLING ONE DAY TO TAKE 40 MG THEN TOTAL, BUT STICK TO 20 MG A DAY AS A RULE. 3)  f/u 6 months  Current Allergies (reviewed today): ! COUMADIN ! CODEINE ! ACE INHIBITORS LIPITOR (ATORVASTATIN CALCIUM) TICLID MORPHINE SULFATE (MORPHINE SULFATE)

## 2010-05-12 NOTE — Assessment & Plan Note (Signed)
Summary: dyspnea, COPD   Visit Type:  Follow-up Primary Provider/Referring Provider:  Hannah Beat MD  CC:  COPD.  Dyspnea.  No breathing changes per patient...here to discuss cxr at last office visit.  History of Present Illness: Timothy Cordova is a 77 year old man with a history of moderate COPD, allergic rhinitis, coronary artery disease (patent grafts on last cath, [name])  [name] 04/19/09 -- returns for regular f/u. Now on cozaar and tolerating well. Breathing well, no flares or hospitalizations. Remains active, does get some sob with prolonged activity. Doing stationary bike. Hasn't restarted tobacco.   ROV 12/06/09 -- follows up for acute-on-chronic dyspnea in setting COPD, CAD. Timothy Cordova saw [name] in 04/2009 for less severe symptoms, vein grafts were patent and PAOP was 18. Timothy Cordova now is having SOB with simply walking to mailbox. Was previously doing exercise bike. Timothy Cordova has had improvement in breathing in past with lasix (Timothy Cordova uses as needed for LE edema). Remains on Spiriva and Symbicort + Proventil. No wheeze, no cough, no mucous, no CP. Timothy Cordova has had panting and pre-syncope.  ROV 12/10/09 -- returns for f/u of his progressive SOB. His cards eval has been reassuring. CXR shows emphysema, some kyphosis, no infiltrates or pulm edema. Of note, Timothy Cordova has gained 30 lbs since beginning of the yr!.   Preventive Screening-Counseling & Management  Alcohol-Tobacco     Smoking Status: quit     Packs/Day: 3.0     Year Started: 1944     Year Quit: 2007     Pack years: 183  Current Medications (verified): 1)  Glipizide 10 Mg Tb24 (Glipizide) .... Take 1 Tablet By Mouth Once A Day 2)  Metoprolol Tartrate 25 Mg Tabs (Metoprolol Tartrate) .... Take 1 Tablet By Mouth Twice A Day 3)  Adult Aspirin Low Strength 81 Mg  Tbdp (Aspirin) .... Take 1 Tablet By Mouth Once A Day 4)  Simvastatin 40 Mg  Tabs (Simvastatin) .... Take 1 Tablet By Mouth Once A Day 5)  Symbicort 160-4.5 Mcg/act  Aero (Budesonide-Formoterol  Fumarate) .... Inhale 2 Puffs Two Times A Day 6)  Metformin Hcl 1000 Mg  Tabs (Metformin Hcl) .[name].. 1 Two Times A Day 7)  Spiriva Handihaler 18 Mcg  Caps (Tiotropium Bromide Monohydrate) .... Two Puffs in Handihaler Daily 8)  Hydrocodone-Acetaminophen 10-325 Mg Tabs (Hydrocodone-Acetaminophen) .... Take As Directed For Pain Needed 9)  Furosemide 40 Mg Tabs (Furosemide) .... 1/2 To 1 By Mouth Daily 10)  Doxazosin Mesylate 4 Mg  Tabs (Doxazosin Mesylate) .[name].. 1-2 Tabs Daily As Directed 11)  Cozaar 50 Mg Tabs (Losartan Potassium) .... Take One Tablet Daily  Allergies (verified): 1)  ! Coumadin 2)  ! Codeine 3)  ! Ace Inhibitors 4)  Lipitor (Atorvastatin Calcium) 5)  Ticlid 6)  Morphine Sulfate (Morphine Sulfate)  Social History: Packs/Day:  3.0 Pack years:  183  Vital Signs:  Patient profile:   77 year old male Height:      67 inches (170.18 cm) Weight:      204 pounds (92.73 kg) BMI:     32.07 O2 Sat:      96 % on Room air Temp:     97.7 degrees F (36.50 degrees C) oral Pulse rate:   81 / minute BP sitting:   114 / 70  (left arm) Cuff size:   regular  Vitals Entered By: Michel Bickers CMA (December 10, 2009 4:26 PM)  O2 Sat at Rest %:  96 O2 Flow:  Room air CC: COPD.  Dyspnea.  No breathing changes per patient...here to discuss cxr at last office visit Comments Medications reviewed with the patient. Michel Bickers [facility]  December 10, 2009 4:27 PM   Physical Exam  General:  well developed, well nourished, in no acute distress Head:  normocephalic and atraumatic.  no abnormalities observed.   Ears:  no external deformities.   Nose:  no external deformity.   Mouth:  no deformity or lesions Neck:  No deformities, masses, or tenderness noted. Lungs:  distant, clear Heart:  normal rate, regular rhythm, no murmur, and no gallop.   Abdomen:  not examined Msk:  no deformity or scoliosis noted with normal posture Extremities:  no clubbing, cyanosis, edema, or deformity  noted Neurologic:  non-focal Skin:  no rashes, no suspicious lesions, and no ulcerations.   Psych:  alert and cooperative; normal mood and affect; normal attention span and concentration   Impression & Recommendations:  Problem # 1:  DYSPNEA (ICD-786.05) CXR reassuring, without evidence ILD. I continue to suspect that wt gain and deconditioning are makor contributors as well as his underlying COPD. Coronary disease has been stable, last cath with clean grafts in 1/11.  Problem # 2:  COPD (ICD-496)  Other Orders: Est. Patient Level IV (04540)  Patient Instructions: 1)  Please stay on your inhaled medications as you are taking them.  2)  We need to work on exercise and weight loss. This will significantly help your breathing. 3)  Continue your workouts with Silver Sneakers. We could refer you to Pulmonary Rehab at some point if you believe this would be helpful.  4)  Follow up in 1 year or as needed.

## 2010-05-12 NOTE — Assessment & Plan Note (Signed)
Summary: f1y   Medications Added FUROSEMIDE 40 MG TABS (FUROSEMIDE) pt has it on hold at this time(05.11.2010)      Allergies Added:   Visit Type:  Follow-up Primary Provider:  Hannah Beat MD  CC:  pt lasix was placed on hold by his primary care Dr. Patsy Lager.  Pt has had increased sob.-weakness-and fatigue.  History of Present Illness: Patient is 77 years old and return for followup management of CABG and shortness of breath. He had bypass surgery in 2006. He was evaluated for shortness of breath with an angiogram in January 2010. All his grafts were patent. His pulmonary wedge pressure was 18.  He also has COPD. He is on inhalers for this.  He has been on Lasix for peripheral edema which is probably related to venous insufficiency with possibly some mild diastolic heart failure. Recently his creatinine rose to 2.0 and his potassium rose to 5.0 and his Lasix is being held with plans for followup laboratory studies at Dr. Durel Salts office tomorrow.  His other problems include hypertension and hyperlipidemia.  Current Medications (verified): 1)  Glipizide 10 Mg Tb24 (Glipizide) .... Take 1 Tablet By Mouth Once A Day 2)  Metoprolol Tartrate 25 Mg Tabs (Metoprolol Tartrate) .... Take 1 Tablet By Mouth Twice A Day 3)  Adult Aspirin Low Strength 81 Mg  Tbdp (Aspirin) .... Take 1 Tablet By Mouth Once A Day 4)  Simvastatin 40 Mg  Tabs (Simvastatin) .... Take 1 Tablet By Mouth Once A Day 5)  Symbicort 160-4.5 Mcg/act  Aero (Budesonide-Formoterol Fumarate) .... Inhale 2 Puffs Two Times A Day 6)  Metformin Hcl 1000 Mg  Tabs (Metformin Hcl) .[name].. 1 Two Times A Day 7)  Spiriva Handihaler 18 Mcg  Caps (Tiotropium Bromide Monohydrate) .... Two Puffs in Handihaler Daily 8)  Hydrocodone-Acetaminophen 10-325 Mg Tabs (Hydrocodone-Acetaminophen) .... Take As Directed For Pain Needed 9)  Furosemide 40 Mg Tabs (Furosemide) .... Pt Has It On Hold At This Time(05.11.2010) 10)  Doxazosin Mesylate 4 Mg   Tabs (Doxazosin Mesylate) .[name].. 1-2 Tabs Daily As Directed 11)  Cozaar 50 Mg Tabs (Losartan Potassium) .... Take One Tablet Daily  Allergies (verified): 1)  ! Coumadin 2)  ! Codeine 3)  ! Ace Inhibitors 4)  Lipitor (Atorvastatin Calcium) 5)  Ticlid 6)  Morphine Sulfate (Morphine Sulfate)  Past History:  Past Medical History: Reviewed history from 06/07/2008 and no changes required.  Benign prostatic hypertrophy  Hypertension Dementia  Allergic rhinitis  Hyperlipidemia  Coronary artery disease----------------------------------[name]         S/P CABG 2006 with patent grafts 04/2008  COPD--------------------------------------------------------Dr Byrum  Dementia--3/08  Hearing loss L>>R Diabetes mellitus, type II  Ortho= [facility], Dr. Darrelyn Hillock  Review of Systems       ROS is negative except as outlined in HPI.   Vital Signs:  Patient profile:   77 year old male Height:      67 inches Weight:      200 pounds Pulse rate:   69 / minute BP sitting:   108 / 65  (left arm) Cuff size:   regular  Vitals Entered By: Burnett Kanaris, CNA (Aug 18, 2009 3:48 PM)  Physical Exam  Additional Exam:  Gen. Well-nourished, in no distress   Neck: No JVD, thyroid not enlarged, no carotid bruits Lungs: No tachypnea, clear without rales, rhonchi or wheezes Cardiovascular: Rhythm regular, PMI not displaced,  heart sounds  normal, no murmurs or gallops, 1-2+ bilateral peripheral edema, pulses normal in all 4  extremities. Abdomen: BS normal, abdomen soft and non-tender without masses or organomegaly, no hepatosplenomegaly. MS: No deformities, no cyanosis or clubbing   Neuro:  No focal sns   Skin:  no lesions    Impression & Recommendations:  Problem # 1:  DYSPNEA (ICD-786.05) I think his dyspnea is probably multifactorial related to his COPD, probably some element of diastolic CHF, and deconditioning. I've encouraged him to get into some regular exercise to help with the  latter. His updated medication list for this problem includes:    Metoprolol Tartrate 25 Mg Tabs (Metoprolol tartrate) .[name]... Take 1 tablet by mouth twice a day    Adult Aspirin Low Strength 81 Mg Tbdp (Aspirin) .[name]... Take 1 tablet by mouth once a day    Furosemide 40 Mg Tabs (Furosemide) .[name]... Pt has it on hold at this time(05.11.2010)    Cozaar 50 Mg Tabs (Losartan potassium) .[name]... Take one tablet daily  Problem # 2:  CORONARY ARTERY DISEASE (ICD-414.00) He had bypass surgery in 2006 and patent grafts in January 2010. He's had no chest pain and this prompted stable. His updated medication list for this problem includes:    Metoprolol Tartrate 25 Mg Tabs (Metoprolol tartrate) .[name]... Take 1 tablet by mouth twice a day    Adult Aspirin Low Strength 81 Mg Tbdp (Aspirin) .[name]... Take 1 tablet by mouth once a day  Problem # 3:  DIASTOLIC HEART FAILURE, CHRONIC (ICD-428.32) He probably has some element of diastolic heart failure judging from the fact that his pulmonary wedge was 18 at catheterization in January 2010. He has no JVD today and I think most of his lower extremity edema is related to venous insufficiency. We will defer his diuretic management to Dr. Dallas Schimke. I suggested that he get support hose for his venous insufficiency. His updated medication list for this problem includes:    Metoprolol Tartrate 25 Mg Tabs (Metoprolol tartrate) .[name]... Take 1 tablet by mouth twice a day    Adult Aspirin Low Strength 81 Mg Tbdp (Aspirin) .[name]... Take 1 tablet by mouth once a day    Furosemide 40 Mg Tabs (Furosemide) .[name]... Pt has it on hold at this time(05.11.2010)    Cozaar 50 Mg Tabs (Losartan potassium) .[name]... Take one tablet daily  Problem # 4:  MIXED HYPERLIPIDEMIA (ICD-272.2) He has hyperlipidemia with a low HDL. His last lipid profile was a year ago so we'll plan to get a lipid profile with his labs tomorrow. With his low HDL and encourage him to lose weight, try to exercise more, and avoid the back  carbohydrates. His updated medication list for this problem includes:    Simvastatin 40 Mg Tabs (Simvastatin) .[name]... Take 1 tablet by mouth once a day  Other Orders: EKG w/ Interpretation (93000)  Patient Instructions: 1)  Your physician recommends that you have a FASTING lipid profile on 5/12 when you go to Dr. Durel Salts office: 272.2  2)  Your physician recommends that you continue on your current medications as directed. Please refer to the Current Medication list given to you today. 3)  Your physician wants you to follow-up in: 1 year with Dr. Daleen Squibb.  You will receive a reminder letter in the mail two months in advance. If you don't receive a letter, please call our office to schedule the follow-up appointment. 4)  You have been given an order for support stockings.

## 2010-05-12 NOTE — Progress Notes (Signed)
Summary: c/o breathing problem   Phone Note Call from Patient Call back at Home Phone [phone] Call back at [phone]   Caller: Spouse Reason for Call: Talk to Nurse Complaint: Breathing Problems Details of Complaint: c/o no chestpain. pt feel like he not getting any oxygen.  Initial call taken by: Lorne Skeens,  December 01, 2009 8:28 AM  Follow-up for Phone Call        I spoke with [name] ( who is a patient of Dr. [name]) He will  check his blood pressure and take his furosemide.   He says he has had a 30lb weight gain in the past 6 months. He will make sure to drink fluids and eat today.  He has called his Pulmonary doctor and states " he could not get an appt. until September"  I will call him back this afternoon.  I encouraged him to call his Pulmonary back and tell them about his breathing again. Mylo Red RN     Appended Document: c/o breathing problem I called [name] back today.  His b/p is 118/60.  His wife states he is still dyspneic on exertion.  Short distance, carrying groceries in from the car, "wear him out".  He is taking the furosemide and urinating.  He does not have any chest discomfort.  "He just seems frustrated". He does not smoke.  He refuses EMS/ED.  His wife is very concerned about him. I have made an appt. with DOD 12/07/09 at 1:45pm Dr. Eden Emms per Dr. [name] nurse.  Wife is aware of appt. and agreeable.   Mylo Red  RN  Appended Document: c/o breathing problem Dulcy Fanny   This patient just had a cath and his SOB is not cardiac.  He has COPD and sees Byrum.  Have him see the DOD in PULMONARY Monday or Tuesday.  He does not need to see me He can F/U with Bruce when appt available.  His complaint is SOB!!!!!  Have him see pulmonary  Appended Document: c/o breathing problem See my previous note.  Needs to see pulmonary not cards.  His complaint is SOB and he just had a cath with normal grafts and PCWP 18  Appended Document: c/o breathing  problem spoke with pt, he will see pulmonary today at 4:30pm

## 2010-05-12 NOTE — Assessment & Plan Note (Signed)
Summary: COPD   Visit Type:  Follow-up  CC:  COPD.  Patient states his breathing is doing well.[name]  History of Present Illness: Timothy Cordova is a 77 year old man with a history of moderate COPD, allergic rhinitis, coronary artery disease (patent grafts on last cath, [name])  Timothy Cordova 04/19/09 -- returns for regular f/u. Now on cozaar and tolerating well. Breathing well, no flares or hospitalizations. Remains active, does get some sob with prolonged activity. Doing stationary bike. Hasn't restarted tobacco.   Current Medications (verified): 1)  Glipizide 10 Mg Tb24 (Glipizide) .... Take 1 Tablet By Mouth Once A Day 2)  Metoprolol Tartrate 25 Mg Tabs (Metoprolol Tartrate) .... Take 1 Tablet By Mouth Twice A Day 3)  Adult Aspirin Low Strength 81 Mg  Tbdp (Aspirin) .... Take 1 Tablet By Mouth Once A Day 4)  Simvastatin 40 Mg  Tabs (Simvastatin) .... Take 1 Tablet By Mouth Once A Day 5)  Symbicort 160-4.5 Mcg/act  Aero (Budesonide-Formoterol Fumarate) .... Inhale 2 Puffs Two Times A Day 6)  Metformin Hcl 1000 Mg  Tabs (Metformin Hcl) .[name].. 1 Two Times A Day 7)  Spiriva Handihaler 18 Mcg  Caps (Tiotropium Bromide Monohydrate) .... Two Puffs in Handihaler Daily 8)  Hydrocodone-Acetaminophen 10-325 Mg Tabs (Hydrocodone-Acetaminophen) .... Take As Directed For Pain Needed 9)  Furosemide 40 Mg Tabs (Furosemide) .[name].. 1 A Day 10)  Doxazosin Mesylate 4 Mg  Tabs (Doxazosin Mesylate) .[name].. 1-2 Tabs Daily As Directed 11)  Cozaar 50 Mg Tabs (Losartan Potassium) .... Take One Tablet Daily  Allergies (verified): 1)  ! Coumadin 2)  ! Codeine 3)  ! Ace Inhibitors 4)  Lipitor (Atorvastatin Calcium) 5)  Ticlid 6)  Morphine Sulfate (Morphine Sulfate)  Vital Signs:  Patient profile:   77 year old male Height:      67.5 inches (171.45 cm) Weight:      203 pounds (92.27 kg) BMI:     31.44 O2 Sat:      93 % on Room air Temp:     97.8 degrees F (36.56 degrees C) oral Pulse rate:   76 / minute BP sitting:    118 / 72  (left arm) Cuff size:   regular  Vitals Entered By: Timothy Cordova CMA (April 19, 2009 10:21 AM)  O2 Flow:  Room air  Physical Exam  General:  well developed, well nourished, in no acute distress Head:  normocephalic and atraumatic.  no abnormalities observed.   Ears:  no external deformities.   Nose:  no external deformity.   Mouth:  no deformity or lesions Neck:  No deformities, masses, or tenderness noted. Lungs:  some coarse low-pitched insp sounds B Heart:  normal rate, regular rhythm, no murmur, and no gallop.   Abdomen:  not examined Msk:  no deformity or scoliosis noted with normal posture Pulses:  decreased DP and PT pulses B Extremities:  No clubbing, cyanosis, edema, or deformity noted with normal full range of motion of all joints.   Neurologic:  alert & oriented X3 and gait normal.   Skin:  no rashes, no suspicious lesions, and no ulcerations.   Cervical Nodes:  No lymphadenopathy noted Psych:  Cognition and judgment appear intact. Alert and cooperative with normal attention span and concentration. No apparent delusions, illusions, hallucinations   Impression & Recommendations:  Problem # 1:  COPD (ICD-496) Continue your Symbicort and Spiriva as ordered for now. Call Lawson Fiscal in our office with the insurance information regarding generic medications.  Follow up with  Dr Delton Coombes in 6 -9 months or if you have any problems.   Other Orders: Est. Patient Level III (91478)  Patient Instructions: 1)  Continue your Symbicort and Spiriva as ordered for now. Call Lawson Fiscal in our office with the insurance information regarding generic medications.  2)  Follow up with Dr Delton Coombes in 6 -9 months or if you have any problems.

## 2010-05-12 NOTE — Assessment & Plan Note (Signed)
Summary: dyspnea, COPD, CAD   Visit Type:  Follow-up Copy to:  Dr. Charlton Haws Primary Timothy Cordova/Referring Timothy Cordova:  Timothy Beat MD  CC:  COPD.  the patient c/o increased sob with exertion for 6 months or longer.[name]  History of Present Illness: Timothy Cordova is a 77 year old man with a history of moderate COPD, allergic rhinitis, coronary artery disease (patent grafts on last cath, [name])  Timothy Cordova 04/19/09 -- returns for regular f/u. Now on cozaar and tolerating well. Breathing well, no flares or hospitalizations. Remains active, does get some sob with prolonged activity. Doing stationary bike. Hasn't restarted tobacco.   ROV 12/06/09 -- follows up for acute-on-chronic dyspnea in setting COPD, CAD. He saw [name] in 04/2009 for less severe symptoms, vein grafts were patent and PAOP was 18. He now is having SOB with simply walking to mailbox. Was previously doing exercise bike. He has had improvement in breathing in past with lasix (he uses as needed for LE edema). Remains on Spiriva and Symbicort + Proventil. No wheeze, no cough, no mucous, no CP. He has had panting and pre-syncope.   Current Medications (verified): 1)  Glipizide 10 Mg Tb24 (Glipizide) .... Take 1 Tablet By Mouth Once A Day 2)  Metoprolol Tartrate 25 Mg Tabs (Metoprolol Tartrate) .... Take 1 Tablet By Mouth Twice A Day 3)  Adult Aspirin Low Strength 81 Mg  Tbdp (Aspirin) .... Take 1 Tablet By Mouth Once A Day 4)  Simvastatin 40 Mg  Tabs (Simvastatin) .... Take 1 Tablet By Mouth Once A Day 5)  Symbicort 160-4.5 Mcg/act  Aero (Budesonide-Formoterol Fumarate) .... Inhale 2 Puffs Two Times A Day 6)  Metformin Hcl 1000 Mg  Tabs (Metformin Hcl) .[name].. 1 Two Times A Day 7)  Spiriva Handihaler 18 Mcg  Caps (Tiotropium Bromide Monohydrate) .... Two Puffs in Handihaler Daily 8)  Hydrocodone-Acetaminophen 10-325 Mg Tabs (Hydrocodone-Acetaminophen) .... Take As Directed For Pain Needed 9)  Furosemide 40 Mg Tabs (Furosemide) .... 1/2 To  1 By Mouth Daily 10)  Doxazosin Mesylate 4 Mg  Tabs (Doxazosin Mesylate) .[name].. 1-2 Tabs Daily As Directed 11)  Cozaar 50 Mg Tabs (Losartan Potassium) .... Take One Tablet Daily  Allergies (verified): 1)  ! Coumadin 2)  ! Codeine 3)  ! Ace Inhibitors 4)  Lipitor (Atorvastatin Calcium) 5)  Ticlid 6)  Morphine Sulfate (Morphine Sulfate)  Vital Signs:  Patient profile:   77 year old male Height:      67 inches (170.18 cm) Weight:      203.50 pounds (92.50 kg) BMI:     31.99 O2 Sat:      96 % on Room air Temp:     97.9 degrees F (36.61 degrees C) oral Pulse rate:   67 / minute BP sitting:   140 / 60  (left arm) Cuff size:   regular  Vitals Entered By: Michel Bickers CMA (December 06, 2009 4:15 PM)  O2 Sat at Rest %:  96 O2 Flow:  Room air  Serial Vital Signs/Assessments:  Comments: 5:40 PM Ambulatory Pulse Oximetry  Resting; HR__69___    02 Sat__96% on room air___  Lap1 (185 feet)   HR__80___   02 Sat___94% on room air__ Lap2 (185 feet)   HR__84___   02 Sat__93% on room air___    Lap3 (185 feet)   HR__82___   02 Sat__91% on room air___  _x__Test Completed without Difficulty ___Test Stopped due to:  By: Michel Bickers CMA   CC: COPD.  the patient c/o increased  sob with exertion for 6 months or longer. Is Patient Diabetic? Yes Comments Medications reviewed with the patient. Daytime phone verified. Michel Bickers CMA  December 06, 2009 4:24 PM   Physical Exam  General:  well developed, well nourished, in no acute distress Head:  normocephalic and atraumatic Mouth:  no deformity or lesions Neck:  No deformities, masses, or tenderness noted. Lungs:  mild bibasilar crackles. No wheeze on normal resp but he does wheeze at end of forced exp Heart:  normal rate, regular rhythm, no murmur, and no gallop.   Abdomen:  not examined Msk:  no deformity or scoliosis noted with normal posture Extremities:  trace R pretibial edema Neurologic:  alert & oriented X3 and gait normal.     Skin:  intact without lesions or rashes Psych:  alert and cooperative; normal mood and affect; normal attention span and concentration   Impression & Recommendations:  Problem # 1:  DYSPNEA (ICD-786.05) I think this is multifactorial, but suspect his progressive symptoms are due to his COPD and deconditioning. Cath in 1/11 showed patent vein grafts. Will check CXR to r/o pulm edema component, ILD. He is on good BD's. r/o hypoxemia and consider pulm rehab.   Problem # 2:  COPD (ICD-496)  Medications Added to Medication List This Visit: 1)  Furosemide 40 Mg Tabs (Furosemide) .... 1/2 to 1 by mouth daily  Other Orders: Est. Patient Level IV (09811) T-2 View CXR (71020TC)  Patient Instructions: 1)  Continue your Spiriva and Symbicort as ordered.  2)  Exertional oximetry today showed that you do not need oxygen while walking.  3)  We will perform a CXR 4)  Follow up with Dr Delton Coombes next available appointment to review your symptoms and studies.

## 2010-05-12 NOTE — Progress Notes (Signed)
Summary: regarding lasix  Phone Note Call from Patient Call back at [facility] Phone [phone]   Caller: Patient Summary of Call: Advised pt of lab results.  He is asking if he should re start lasix. He's retaining fluids. Initial call taken by: Lowella Petties CMA,  Aug 20, 2009 9:22 AM  Follow-up for Phone Call        He never was supposed to have stopped lasix (see my last note) - I had wanted it decreased.  Make sure he is taking 20 mg a day. His kidney function got better.   Like we discussed 20 mg a day, with an occaisional extra if retaining a lot more fluid.  Follow-up by: Hannah Beat MD,  Aug 22, 2009 10:17 AM  Additional Follow-up for Phone Call Additional follow up Details #1::        [facility] for pt to call back.          Lowella Petties CMA  Aug 23, 2009 10:21 AM     Additional Follow-up for Phone Call Additional follow up Details #2::    patient advised.Consuello Masse CMA  Follow-up by: Benny Lennert CMA Duncan Dull),  Aug 24, 2009 8:32 AM

## 2010-05-12 NOTE — Assessment & Plan Note (Signed)
Summary: CHEST CONGESTION,RUNNY NOSE,COUGH/   Vital Signs:  Patient profile:   77 year old male Height:      67 inches Weight:      200.50 pounds BMI:     31.52 Temp:     97.5 degrees F oral Pulse rate:   84 / minute Pulse rhythm:   regular BP sitting:   120 / 74  (left arm) Cuff size:   large  Vitals Entered By: Benny Lennert CMA Duncan Dull) (May 04, 2010 8:39 AM)  Primary Provider:  Hannah Beat MD   History of Present Illness: . 1. COPD: exacerbation, bronchitis / pneumonia  2. Bronchitis:  pleasant 77 year old old medical problems and I know well including coronary artery disease and COPD, who had been relatively well controlled on Spiriva and Symbicort who presents now with a lingering illness since middle of December.  He is short of breath, does have some sputum production, though he has remained afebrile. He does have some more dyspnea on exertion compared to previously. He feels if he is subjectively wheezing and not moving air well.  This is a first-time he has sought medical care or medical attention.  3. dizziness: The patient also feels occasionally dizzy and lightheaded, and sometimes been feeling weak and nauseous. He wonders if this could go along with his metoprolol.  Actually is well overdue for a cardiology checkup as well, and he does know that Dr. [name] has retired.  Allergies: 1)  ! Coumadin 2)  ! Codeine 3)  ! Ace Inhibitors 4)  Lipitor (Atorvastatin Calcium) 5)  Ticlid 6)  Morphine Sulfate (Morphine Sulfate)  Past History:  Past medical, surgical, family and social histories (including risk factors) reviewed, and no changes noted (except as noted below).  Past Medical History: CORONARY ARTERY DISEASE---------------------------------[name] S/P CABG 2006 with patent grafts 04/2008 CAD, AUTOLOGOUS BYPASS GRAFT  HYPERTENSION  COPD--------------------------------------------------------Dr Byrum DIASTOLIC HEART FAILURE, CHRONIC EDEMA-  LOCALIZED MIXED HYPERLIPIDEMIA ? of SLEEP APNEA DIABETES MELLITUS, TYPE II  TOBACCO ABUSE, HX OF  ALLERGIC RHINITIS  HYPERCHOLESTEROLEMIA  BENIGN PROSTATIC HYPERTROPHY  OSTEOARTHRITIS  Dementia--3/08 Hearing loss L>>R Ortho= [facility], Dr. Darrelyn Hillock  Past Surgical History: Reviewed history from 05/10/2007 and no changes required. Angioplasty  1991 & 1996 Rotator cuff (L) Gioffre 2005 CABG 11/06 Echo 4/08---EF-55%, mild LAE  Family History: Reviewed history from 07/06/2006 and no changes required. Dad died@46 --accident Mom died @54 --MI 3 brothers--1 suicide 6 sisters--1 died lung cancer No HTN,DM No prostate or colon cancer  Social History: Reviewed history from 07/06/2006 and no changes required. Retired--rub[employer] Married--1 son Former Smoker--long time then quit 2006 Alcohol use-no Enjoys golf and yard work  Review of Systems General:  Complains of fatigue; denies chills and fever. Neuro:  Complains of shortness of breath with exertion; denies chest pain or discomfort, swelling of feet, and swelling of hands. Endo:  Complains of cough, shortness of breath, sputum productive, and wheezing; denies coughing up blood.  Physical Exam  General:  Well-developed,well-nourished,in no acute distress; alert,appropriate and cooperative throughout examination Head:  Normocephalic and atraumatic without obvious abnormalities. No apparent alopecia or balding. Ears:  no external deformities.   Nose:  no external deformity.   Mouth:  Oral mucosa and oropharynx without lesions or exudates.  Teeth in good repair. Lungs:  normal respiratory effort.  does have some decreased bs and occ. scattered wheezes very rarely - worse than baseline. no focal crackles. Heart:  normal rate, regular rhythm, no murmur, and no gallop.   Extremities:  No clubbing, cyanosis, edema, or deformity noted with normal full range of motion of all joints.   Cervical Nodes:  No lymphadenopathy  noted Psych:  Cognition and judgment appear intact. Alert and cooperative with normal attention span and concentration. No apparent delusions, illusions, hallucinations   Impression & Recommendations:  Problem # 1:  CHRONIC OBSTRUCTIVE PULMONARY DISEASE, ACUTE EXACERBATION (ICD-491.21) Assessment New  decompensated COPD, place on prednisone and cover with antibiotics for now.  I think we need to clear him up from a pulmonary standpoint prior to altering any BP meds.  I also think in this patient it is very important to help him get established with a Cardiologist, since Dr. [name] has retired, so I have referred him to Dr. Shirlee Latch.  His updated medication list for this problem includes:    Symbicort 160-4.5 Mcg/act Aero (Budesonide-formoterol fumarate) ..... Inhale 2 puffs two times a day    Spiriva Handihaler 18 Mcg Caps (Tiotropium bromide monohydrate) .[name]..[name] Two puffs in handihaler daily    Prednisone 20 Mg Tabs (Prednisone) .[name]... 2 tabs by mouth two times a day for 5 days, then 1 tab by mouth daily for 5 days    Doxycycline Hyclate 100 Mg Caps (Doxycycline hyclate) .[name]... Take 1 tab twice a day  Problem # 2:  BRONCHITIS- ACUTE (ICD-466.0) Assessment: New  His updated medication list for this problem includes:    Symbicort 160-4.5 Mcg/act Aero (Budesonide-formoterol fumarate) ..... Inhale 2 puffs two times a day    Spiriva Handihaler 18 Mcg Caps (Tiotropium bromide monohydrate) .[name]..[name] Two puffs in handihaler daily    Prednisone 20 Mg Tabs (Prednisone) .[name]... 2 tabs by mouth two times a day for 5 days, then 1 tab by mouth daily for 5 days    Doxycycline Hyclate 100 Mg Caps (Doxycycline hyclate) .[name]... Take 1 tab twice a day  Problem # 3:  CAD, AUTOLOGOUS BYPASS GRAFT (ICD-414.02)  His updated medication list for this problem includes:    Metoprolol Tartrate 25 Mg Tabs (Metoprolol tartrate) .[name]... Take 1 tablet by mouth twice a day    Adult Aspirin Low Strength 81 Mg Tbdp (Aspirin) .[name]...  Take 1 tablet by mouth once a day  Orders: Cardiology Referral (Cardiology)  Problem # 4:  DIASTOLIC HEART FAILURE, CHRONIC (ICD-428.32)  His updated medication list for this problem includes:    Metoprolol Tartrate 25 Mg Tabs (Metoprolol tartrate) .[name]... Take 1 tablet by mouth twice a day    Adult Aspirin Low Strength 81 Mg Tbdp (Aspirin) .[name]... Take 1 tablet by mouth once a day    Furosemide 40 Mg Tabs (Furosemide) .[name]... 1/2 to 1 by mouth daily    Cozaar 50 Mg Tabs (Losartan potassium) .[name]... Take one tablet daily  Complete Medication List: 1)  Glipizide 10 Mg Tb24 (Glipizide) .... Take 1 tablet by mouth once a day 2)  Metoprolol Tartrate 25 Mg Tabs (Metoprolol tartrate) .... Take 1 tablet by mouth twice a day 3)  Adult Aspirin Low Strength 81 Mg Tbdp (Aspirin) .... Take 1 tablet by mouth once a day 4)  Simvastatin 40 Mg Tabs (Simvastatin) .... Take 1 tablet by mouth once a day 5)  Symbicort 160-4.5 Mcg/act Aero (Budesonide-formoterol fumarate) .... Inhale 2 puffs two times a day 6)  Metformin Hcl 1000 Mg Tabs (Metformin hcl) .[name].. 1 two times a day 7)  Spiriva Handihaler 18 Mcg Caps (Tiotropium bromide monohydrate) .... Two puffs in handihaler daily 8)  Hydrocodone-acetaminophen 10-325 Mg Tabs (Hydrocodone-acetaminophen) .... Take as directed for pain  needed 9)  Furosemide 40 Mg Tabs (Furosemide) .... 1/2 to 1 by mouth daily 10)  Doxazosin Mesylate 4 Mg Tabs (Doxazosin mesylate) .[name].. 1-2 tabs daily as directed 11)  Cozaar 50 Mg Tabs (Losartan potassium) .... Take one tablet daily 12)  Prednisone 20 Mg Tabs (Prednisone) .... 2 tabs by mouth two times a day for 5 days, then 1 tab by mouth daily for 5 days 13)  Doxycycline Hyclate 100 Mg Caps (Doxycycline hyclate) .... Take 1 tab twice a day  Other Orders: Est. Patient Level IV (14782)  Patient Instructions: 1)  Referral Appointment Information 2)  Day/Date: 3)  Time: 4)  Place/MD: 5)  Address: 6)  Phone/Fax: 7)  Patient given  appointment information. Information/Orders faxed/mailed.  Prescriptions: DOXYCYCLINE HYCLATE 100 MG CAPS (DOXYCYCLINE HYCLATE) Take 1 tab twice a day  #20 x 0   Entered and Authorized by:   Hannah Beat MD   Signed by:   Hannah Beat MD on 05/04/2010   Method used:   Electronically to        [facility]* (retail)       [address]       [place], [place]  [address]       Ph: [phone]       Fax: [phone]   RxID:   [id number] PREDNISONE 20 MG TABS (PREDNISONE) 2 tabs by mouth two times a day for 5 days, then 1 tab by mouth daily for 5 days  #15 x 0   Entered and Authorized by:   Hannah Beat MD   Signed by:   Hannah Beat MD on 05/04/2010   Method used:   Electronically to        [facility]* (retail)       [address]       [place], [place]  [address]       Ph: [phone]       Fax: [phone]   RxID:   [id number]    Orders Added: 1)  Cardiology Referral [Cardiology] 2)  Est. Patient Level IV [73220]

## 2010-05-12 NOTE — Progress Notes (Signed)
Summary: donut hole-needs samples  Phone Note Call from Patient Call back at Home Phone [phone]   Caller: [facility] Call For: byrum Reason for Call: Talk to Nurse Summary of Call: Wife calling for husband.  She says he has fallen into the donut hole and is calling to see if we could do anything to help.  Maybe his meds could be switched to something cheaper. Initial call taken by: Lehman Prom,  January 06, 2010 12:52 PM  Follow-up for Phone Call        Spoke with pt wife and she states that pt is in the donut hole and cannot afford inhalers.  They have applied for patient assistance for both inhalers and were denied because their combined income exceeded the limit by $25. The pt is doing well on spiriva and symbicort and doesn't want to change meds. I have provided a month of samples for the pt to pickup at the front desk. Carron Curie CMA  January 06, 2010 4:42 PM

## 2010-05-12 NOTE — Medication Information (Signed)
Summary: Tax adviser   Imported By: Valinda Hoar 08/16/2009 10:05:23  _____________________________________________________________________  External Attachment:    Type:   Image     Comment:   External Document

## 2010-05-18 NOTE — Assessment & Plan Note (Signed)
Summary: ec6/sob  Medications Added BISOPROLOL FUMARATE 5 MG TABS (BISOPROLOL FUMARATE) Take one tablet by mouth daily FUROSEMIDE 40 MG TABS (FUROSEMIDE) 1 tablet by mouth daily      Allergies Added:   Visit Type:  Initial Consult Primary Provider:  Hannah Beat MD  CC:  Former Dr. [name] patient. c/o muscle weakness in legs, right leg swelling, tiredness, and shortness of breath and dizziness.Timothy Cordova  History of Present Illness: 77 yo former patient of Dr. [name] with history of CAD s/p CABG, diastolic CHF, COPD and CKD presents for cardiology evaluation. Patient has been congested and coughing recently.  He saw Dr. Patsy Lager and was given antibiotics and a steroid course.  He is feeling better with meds, cough has diminished.  No chest pain.  Chronic significant exertional dyspnea after walking 200-300 feet.  He is short of breath with steps.  He is not taking any Lasix (has Lasix for as needed use but not using).  He has significant chronic lower extremity edema.  He also wants to know if he can stop metoprolol as he feels like starting this also started his increased shortness of breath.   ECG: NSR, RBBB  Labs (5/11): K 5, creatinine 1.4, LDL 73, HDL 36  Current Medications (verified): 1)  Glipizide 10 Mg Tb24 (Glipizide) .... Take 1 Tablet By Mouth Once A Day 2)  Metoprolol Tartrate 25 Mg Tabs (Metoprolol Tartrate) .... Take 1 Tablet By Mouth Twice A Day 3)  Adult Aspirin Low Strength 81 Mg  Tbdp (Aspirin) .... Take 1 Tablet By Mouth Once A Day 4)  Simvastatin 40 Mg  Tabs (Simvastatin) .... Take 1 Tablet By Mouth Once A Day 5)  Symbicort 160-4.5 Mcg/act  Aero (Budesonide-Formoterol Fumarate) .... Inhale 2 Puffs Two Times A Day 6)  Metformin Hcl 1000 Mg  Tabs (Metformin Hcl) .Timothy Cordova.. 1 Two Times A Day 7)  Spiriva Handihaler 18 Mcg  Caps (Tiotropium Bromide Monohydrate) .... Two Puffs in Handihaler Daily 8)  Hydrocodone-Acetaminophen 10-325 Mg Tabs (Hydrocodone-Acetaminophen) .... Take As  Directed For Pain Needed 9)  Furosemide 40 Mg Tabs (Furosemide) .... 1/2 To 1 By Mouth Daily 10)  Doxazosin Mesylate 4 Mg  Tabs (Doxazosin Mesylate) .Timothy Cordova.. 1-2 Tabs Daily As Directed 11)  Cozaar 50 Mg Tabs (Losartan Potassium) .... Take One Tablet Daily 12)  Prednisone 20 Mg Tabs (Prednisone) .... 2 Tabs By Mouth One Times A Day For 5 Days, Then 1 Tab By Mouth Daily For 5 Days 13)  Doxycycline Hyclate 100 Mg Caps (Doxycycline Hyclate) .... Take 1 Tab Twice A Day  Allergies (verified): 1)  ! Coumadin 2)  ! Codeine 3)  ! 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 CORONARY ARTERY DISEASE: s/p CABG 2006.  LHC (1/10): SVG-RCA, SVG-OM and PLOM, SVG-D, and LIMA-LAD were all patent.  2. HYPERTENSION  3. COPD: Moderate.  Followed by Dr. Delton Coombes.  4. DIASTOLIC HEART FAILURE, CHRONIC: 8/08  echo with EF 55%.  5. EDEMA- LOCALIZED. Suspect diastolic CHF + venous insufficiency.  6. MIXED HYPERLIPIDEMIA 7. ? of SLEEP APNEA 8. DIABETES MELLITUS, TYPE II  9. TOBACCO ABUSE, HX OF  10. ALLERGIC RHINITIS  11. HYPERCHOLESTEROLEMIA  12. BENIGN PROSTATIC HYPERTROPHY  13. OSTEOARTHRITIS  14. Dementia (mild)--3/08 15. Hearing loss L>>R 16. CKD Ortho= [facility], Dr. Darrelyn Hillock  Family History: Reviewed history from  07/06/2006 and no changes required. Dad died@46 --accident Mom died @54 --MI 3 brothers--1 suicide 6 sisters--1 died lung cancer No HTN,DM No prostate or colon cancer  Social History: Retired--rubber Data processing manager Lives in [place] son Former Smoker--long time then quit 2006 Alcohol use-no Enjoys golf and yard work  Review of Systems       All systems reviewed and negative except as per HPI.   Vital Signs:  Patient profile:   77 year old male Height:      67 inches Weight:      201 pounds BMI:     31.59 Pulse rate:   74 / minute BP sitting:   129 / 72  (left arm) Cuff size:   large  Vitals Entered By: Bishop Dublin, CMA (May 06, 2010 11:43 AM)  Physical Exam  General:  Elderly man in no distress.  Neck:  Neck supple, JVP 8 cm. No masses, thyromegaly or abnormal cervical nodes. Lungs:  Slightly diminished breath sounds bilaterally.   Heart:  Non-displaced PMI, chest non-tender; regular rate and rhythm, S1, S2 without murmurs, rubs or gallops. Carotid upstroke normal, no bruit. Pedals normal pulses. 1+ edema to knees bilaterally.   Abdomen:  Bowel sounds positive; abdomen soft and non-tender without masses, organomegaly, or hernias noted. No hepatosplenomegaly. Extremities:  No clubbing or cyanosis. Neurologic:  Alert and oriented x 3. Psych:  Normal affect.   Impression & Recommendations:  Problem # 1:  CAD, AUTOLOGOUS BYPASS GRAFT (ICD-414.02) Stable with no ischemic symptoms.  Continue ASA, beta blocker, statin, ARB.   Problem # 2:  DIASTOLIC HEART FAILURE, CHRONIC (ICD-428.32) Dyspnea is multifactorial with COPD certainly playing a role.  However, patient does appear a bit volume overloaded to me.  I think that it would be reasonable to start back on Lasix 40 mg daily with BMET/BNP in 2 wks.   Problem # 3:  CHRONIC OBSTRUCTIVE PULMONARY DISEASE, ACUTE EXACERBATION (ICD-491.21) Patient is being treated with steroids and antibiotics for possible COPD  flare.  He does feel like metoprolol makes his breathing worse.  This is certainly possible.  I am going to have him stop metoprolol and go on the more beta-1 selective beta blocker bisoprolol.    Other Orders: T-Basic Metabolic Panel [id number]) T-BNP  (B Natriuretic Peptide) [phone])  Patient Instructions: 1)  Your physician recommends that you schedule a follow-up appointment in: 2 months 2)  TO BE SCHEDULED IN 2 WEEKS:  Your physician recommends that you return for lab work in:(BMP, BNP) 3)  Your physician has recommended you make the following change in your medication: INCREASE Lasix 40mg  once daily. STOP Metoprolol. START Bisprolol 5mg . Prescriptions: BISOPROLOL FUMARATE 5 MG TABS (BISOPROLOL FUMARATE) Take one tablet by mouth daily  #30 x 6   Entered by:   Lanny Hurst RN   Authorized by:   Marca Ancona, MD   Signed by:   Lanny Hurst RN on 05/06/2010   Method used:   Electronically to        [facility]* (retail)       [address]  [place], [place]  [address]       Ph: [phone]       Fax: [phone]   RxID:   [id number] FUROSEMIDE 40 MG TABS (FUROSEMIDE) 1 tablet by mouth daily  #30 x 6   Entered by:   Lanny Hurst RN   Authorized by:   Marca Ancona, MD   Signed by:   Lanny Hurst RN on 05/06/2010   Method used:   Electronically to        [facility]* (retail)       [address]       [place], [place]  [address]       Ph: [phone]       Fax: [phone]   RxID:   [id number]

## 2010-05-20 ENCOUNTER — Telehealth: Payer: Self-pay | Admitting: Family Medicine

## 2010-05-20 ENCOUNTER — Other Ambulatory Visit: Payer: Self-pay | Admitting: Family Medicine

## 2010-05-20 ENCOUNTER — Other Ambulatory Visit (INDEPENDENT_AMBULATORY_CARE_PROVIDER_SITE_OTHER): Payer: MEDICARE

## 2010-05-20 ENCOUNTER — Ambulatory Visit (INDEPENDENT_AMBULATORY_CARE_PROVIDER_SITE_OTHER)
Admission: RE | Admit: 2010-05-20 | Discharge: 2010-05-20 | Disposition: A | Discharge status: Home / Self Care | Payer: MEDICARE | Source: Ambulatory Visit | Attending: Family Medicine | Admitting: Family Medicine

## 2010-05-20 ENCOUNTER — Ambulatory Visit (INDEPENDENT_AMBULATORY_CARE_PROVIDER_SITE_OTHER): Payer: MEDICARE | Admitting: Family Medicine

## 2010-05-20 ENCOUNTER — Encounter: Payer: Self-pay | Admitting: Cardiology

## 2010-05-20 ENCOUNTER — Encounter: Payer: Self-pay | Admitting: Family Medicine

## 2010-05-20 DIAGNOSIS — R05 Cough: Secondary | ICD-10-CM

## 2010-05-20 DIAGNOSIS — R059 Cough, unspecified: Secondary | ICD-10-CM

## 2010-05-20 DIAGNOSIS — Z79899 Other long term (current) drug therapy: Secondary | ICD-10-CM

## 2010-05-20 DIAGNOSIS — R0602 Shortness of breath: Secondary | ICD-10-CM

## 2010-05-24 LAB — CONVERTED CEMR LAB
BUN: 33 mg/dL — ABNORMAL HIGH (ref 6–23)
Creatinine, Ser: 1.34 mg/dL (ref 0.40–1.50)
Pro B Natriuretic peptide (BNP): 120.8 pg/mL — ABNORMAL HIGH (ref 0.0–100.0)

## 2010-05-26 NOTE — Assessment & Plan Note (Addendum)
Summary: ? sinus infection   Vital Signs:  Patient profile:   77 year old male Weight:      201 pounds O2 Sat:      99 % on Room air Temp:     97.5 degrees F oral Pulse rate:   66 / minute Pulse rhythm:   regular BP sitting:   116 / 70  (left arm) Cuff size:   large  Vitals Entered By: Selena Batten Dance CMA Duncan Dull) (May 20, 2010 2:01 PM)  O2 Flow:  Room air CC: ? Sinus infection/PNA Comments Has been sick since before Christmas. He gets better when on meds and then gets sick again as soon as he completes Rx course.     History of Present Illness: CC: cold sxs  h/o COPD on spiriva, symbicort.  h/o dCHF, DM, CAD  >1 mo h/o cold sxs.  treated and improves, then returns.  latest treatment included prednisone and doxycycline for presumed COPD exac.  + weight changes (trying to lose weight).    sxs include productive cough of yellow flegm, hoarseness, ST, NS.  weakness, some dizziness at times.  more cough, phlegm production, SOB than baseline.  No fever.  No nasal congestion.  No abd pain, n/v/d, rashes.  No smoking, quit 6 years ago.  Current Medications (verified): 1)  Glipizide 10 Mg Tb24 (Glipizide) .... Take 1 Tablet By Mouth Once A Day 2)  Bisoprolol Fumarate 5 Mg Tabs (Bisoprolol Fumarate) .... Take One Tablet By Mouth Daily 3)  Adult Aspirin Low Strength 81 Mg  Tbdp (Aspirin) .... Take 1 Tablet By Mouth Once A Day 4)  Simvastatin 40 Mg  Tabs (Simvastatin) .... Take 1 Tablet By Mouth Once A Day 5)  Symbicort 160-4.5 Mcg/act  Aero (Budesonide-Formoterol Fumarate) .... Inhale 2 Puffs Two Times A Day 6)  Metformin Hcl 1000 Mg  Tabs (Metformin Hcl) .[name].. 1 Two Times A Day 7)  Spiriva Handihaler 18 Mcg  Caps (Tiotropium Bromide Monohydrate) .... Two Puffs in Handihaler Daily 8)  Hydrocodone-Acetaminophen 10-325 Mg Tabs (Hydrocodone-Acetaminophen) .... Take As Directed For Pain Needed 9)  Furosemide 40 Mg Tabs (Furosemide) .[name].. 1 Tablet By Mouth Daily 10)  Doxazosin Mesylate 4 Mg   Tabs (Doxazosin Mesylate) .[name].. 1-2 Tabs Daily As Directed 11)  Cozaar 50 Mg Tabs (Losartan Potassium) .... Take One Tablet Daily  Allergies: 1)  ! Coumadin 2)  ! Codeine 3)  ! Ace Inhibitors 4)  Lipitor (Atorvastatin Calcium) 5)  Ticlid 6)  Morphine Sulfate (Morphine Sulfate)  Past History:  Past Medical History: Last updated: 05/06/2010 1. CORONARY ARTERY DISEASE: s/p CABG 2006.  LHC (1/10): SVG-RCA, SVG-OM and PLOM, SVG-D, and LIMA-LAD were all patent.  2. HYPERTENSION  3. COPD: Moderate.  Followed by Dr. Delton Coombes.  4. DIASTOLIC HEART FAILURE, CHRONIC: 8/08 echo with EF 55%.  5. EDEMA- LOCALIZED. Suspect diastolic CHF + venous insufficiency.  6. MIXED HYPERLIPIDEMIA 7. ? of SLEEP APNEA 8. DIABETES MELLITUS, TYPE II  9. TOBACCO ABUSE, HX OF  10. ALLERGIC RHINITIS  11. HYPERCHOLESTEROLEMIA  12. BENIGN PROSTATIC HYPERTROPHY  13. OSTEOARTHRITIS  14. Dementia (mild)--3/08 15. Hearing loss L>>R 16. CKD Ortho=  [facility], Dr. 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  tired appearing Head:  Normocephalic and atraumatic without obvious abnormalities. No apparent alopecia or balding.  no sinus tenderness Eyes:  PERRLA, EOMI Ears:  TMs pearly grey bilaterally Nose:  no external deformity.   Mouth:  Oral mucosa and oropharynx without lesions or exudates.  MMM Neck:  No deformities, masses, or tenderness noted.  no LAD, no bruits, no JVD Lungs:  does have some decreased bs and occ. scattered wheezes no focal crackles. coarse Heart:  normal rate, regular rhythm, no murmur, and no gallop.   Pulses:  2+ rad  pulses Extremities:  No clubbing, cyanosis, edema, or deformity noted with normal full range of motion of all joints.   Skin:  Intact without suspicious lesions or rashes   Impression & Recommendations:  Problem # 1:  COUGH (ICD-786.2) prolonged with NS in 77 yo with h/o COPD, recently treated 05/04/2010 with steroids, doxy course.  weight stable.  has continued increased cough, dyspnea and sputum production.  place on another course of steroids as well as avelox.  if not improved, may consider referral back to pulm for further management.  no evidence of CHF exac today.  for COPD, already on ICS, LABA, spiriva.    cxr today - nothing acute to my read.  advised if change in plan based on rad read, will call and update  Orders: T-2 View CXR ([id number])  Complete Medication List: 1)  Glipizide 10 Mg Tb24 (Glipizide) .... Take 1 tablet by mouth once a day 2)  Bisoprolol Fumarate 5 Mg Tabs (Bisoprolol fumarate) .... Take one tablet by mouth daily 3)  Adult Aspirin Low Strength 81 Mg Tbdp (Aspirin) .... Take 1 tablet by mouth once a day 4)  Simvastatin 40 Mg Tabs (Simvastatin) .... Take 1 tablet by mouth once a day 5)  Symbicort 160-4.5 Mcg/act Aero (Budesonide-formoterol fumarate) .... Inhale 2 puffs two times a day 6)  Metformin Hcl 1000 Mg Tabs (Metformin hcl) .[name].. 1 two times a day 7)  Spiriva Handihaler 18 Mcg Caps (Tiotropium bromide monohydrate) .... Two puffs in handihaler daily 8)  Hydrocodone-acetaminophen 10-325 Mg Tabs (Hydrocodone-acetaminophen) .... Take as directed for pain needed 9)  Furosemide 40 Mg Tabs (Furosemide) .[name].. 1 tablet by mouth daily 10)  Doxazosin Mesylate 4 Mg Tabs (Doxazosin mesylate) .[name].. 1-2 tabs daily as directed 11)  Cozaar 50 Mg Tabs (Losartan potassium) .... Take one tablet daily 12)  Avelox 400 Mg Tabs (Moxifloxacin hcl) .... One daily x 5 days 13)  Prednisone 20 Mg Tabs (Prednisone) .... 2 daily x 10 days  Patient Instructions: 1)  Sounds like continued  COPD flare in setting of bronchitis.   2)  Treat with avelox for 5 days as well as prednisone. 3)  If worsening, let us know. 4)  If fevers, or trouble with more cough or breathing, return to be evaluated again. 5)  Good to see you today, I hope you feel better. Prescriptions: PREDNISONE 20 MG TABS (PREDNISONE) 2 daily x 10 days  #20 x 0   Entered and Authorized by:   Eustaquio Boyden  MD   Signed by:   Eustaquio Boyden  MD on 05/20/2010   Method used:   Electronically to        [facility]* (retail)       [address]       [place], [place]  [address]       Ph: [phone]       Fax: [phone]   RxID:   [id number] AVELOX 400 MG  TABS (MOXIFLOXACIN HCL) one daily x 5 days  #5 x 0   Entered and Authorized by:   Eustaquio Boyden  MD   Signed by:   Eustaquio Boyden  MD on 05/20/2010   Method used:   Electronically to        [facility]* (retail)       [address]       [place], [place]  [address]       Ph: [phone]       Fax: [phone]   RxID:   [id number]    Orders Added: 1)  T-2 View CXR [[id number]] 2)  Est. Patient Level IV [32951]    Current Allergies (reviewed today): ! COUMADIN ! CODEINE ! ACE INHIBITORS LIPITOR (ATORVASTATIN CALCIUM) TICLID MORPHINE SULFATE (MORPHINE SULFATE)  Appended Document: ? sinus infection  please call with change in dosing instructions for prednsione (was 2 daily x 10 days).  now I'd like to taper, have him take 2 daily x 7 days then 1 daily x 4 days then 1/2 daily x 4 days.  let me know if questions.  Eustaquio Boyden  MD  May 23, 2010 11:04 PM  Patient notified and wrote down new instructions. Kim Dance CMA Duncan Dull)  May 24, 2010 9:58 AM   Clinical Lists Changes

## 2010-05-26 NOTE — Progress Notes (Signed)
Summary: feeling sick again   Phone Note Call from Patient Call back at Home Phone [phone]   Caller: Patient Call For: Hannah Beat MD Summary of Call: Patient saw Dr. Patsy Lager on 1-27. He says that he is still feeling sick, symptoms were starting to clear up until he finished the med. He says he now feels exactly the way he did before, has alot of congestion, coughing. He is asking if he could get an antibiotic called in to [facility].  Initial call taken by: Melody Comas,  May 20, 2010 8:51 AM  Follow-up for Phone Call        I think given age and COPD and heart history..needs to be seen before simply reeating antibiotics.  Follow-up by: Kerby Nora MD,  May 20, 2010 9:12 AM  Additional Follow-up for Phone Call Additional follow up Details #1::        appt made with dr g for 200pm today Additional Follow-up by: Benny Lennert CMA Duncan Dull),  May 20, 2010 9:26 AM

## 2010-06-01 ENCOUNTER — Encounter: Payer: Self-pay | Admitting: Cardiovascular Disease

## 2010-06-01 ENCOUNTER — Other Ambulatory Visit (INDEPENDENT_AMBULATORY_CARE_PROVIDER_SITE_OTHER): Payer: MEDICARE

## 2010-06-01 DIAGNOSIS — Z79899 Other long term (current) drug therapy: Secondary | ICD-10-CM

## 2010-06-01 DIAGNOSIS — I2510 Atherosclerotic heart disease of native coronary artery without angina pectoris: Secondary | ICD-10-CM

## 2010-06-06 ENCOUNTER — Telehealth: Payer: Self-pay | Admitting: Cardiology

## 2010-06-06 LAB — CONVERTED CEMR LAB
Calcium: 9.7 mg/dL (ref 8.4–10.5)
Sodium: 136 meq/L (ref 135–145)

## 2010-06-07 ENCOUNTER — Encounter: Payer: Self-pay | Admitting: Family Medicine

## 2010-06-07 ENCOUNTER — Ambulatory Visit (INDEPENDENT_AMBULATORY_CARE_PROVIDER_SITE_OTHER): Payer: MEDICARE | Admitting: Family Medicine

## 2010-06-07 DIAGNOSIS — K1370 Unspecified lesions of oral mucosa: Secondary | ICD-10-CM

## 2010-06-16 NOTE — Assessment & Plan Note (Signed)
Summary: MOUTH PAIN/CLE   AARP MEDICARE COMPLETE   Vital Signs:  Patient profile:   77 year old male Weight:      196 pounds Temp:     98.1 degrees F oral Pulse rate:   80 / minute Pulse rhythm:   regular BP sitting:   110 / 70  (left arm) Cuff size:   large  Vitals Entered By: Selena Batten Dance CMA (AAMA) (June 07, 2010 11:15 AM) CC: Mouth pain x1 week (lower gum)   History of Present Illness: CC: mouth pain  1wk h/o lower gum with ulcers on it, thinks maybe reaction to prednisone (recently started for bronchitis in smoker concern for continued COPD exac - actually better).  today felt actually worse, not going away.    No fevers/chills, oral swelling or drooling.  Does wear dentures, now taking them out at night 2/2 discomfort.  Has had chicken pox, no shingles.  has had shingles shot.  Has had fever blisters in past  Pain with chewing.  Never had anything like this before.  Current Medications (verified): 1)  Glipizide 10 Mg Tb24 (Glipizide) .... Take 1 Tablet By Mouth Once A Day 2)  Bisoprolol Fumarate 5 Mg Tabs (Bisoprolol Fumarate) .... Take One Tablet By Mouth Daily 3)  Adult Aspirin Low Strength 81 Mg  Tbdp (Aspirin) .... Take 1 Tablet By Mouth Once A Day 4)  Simvastatin 40 Mg  Tabs (Simvastatin) .... Take 1 Tablet By Mouth Once A Day 5)  Symbicort 160-4.5 Mcg/act  Aero (Budesonide-Formoterol Fumarate) .... Inhale 2 Puffs Two Times A Day 6)  Metformin Hcl 1000 Mg  Tabs (Metformin Hcl) .[name].. 1 Two Times A Day 7)  Spiriva Handihaler 18 Mcg  Caps (Tiotropium Bromide Monohydrate) .... Two Puffs in Handihaler Daily 8)  Hydrocodone-Acetaminophen 10-325 Mg Tabs (Hydrocodone-Acetaminophen) .... Take As Directed For Pain Needed 9)  Furosemide 40 Mg Tabs (Furosemide) .[name].. 1 Tablet By Mouth Daily 10)  Doxazosin Mesylate 4 Mg  Tabs (Doxazosin Mesylate) .[name].. 1-2 Tabs Daily As Directed 11)  Cozaar 50 Mg Tabs (Losartan Potassium) .... Take One Tablet Daily  Allergies: 1)  ! Coumadin 2)   ! Codeine 3)  ! Ace Inhibitors 4)  Lipitor (Atorvastatin Calcium) 5)  Ticlid 6)  Morphine Sulfate (Morphine Sulfate)  Past History:  Past Medical History: Last updated: 05/06/2010 1. CORONARY ARTERY DISEASE: s/p CABG 2006.  LHC (1/10): SVG-RCA, SVG-OM and PLOM, SVG-D, and LIMA-LAD were all patent.  2. HYPERTENSION  3. COPD: Moderate.  Followed by Dr. Delton Coombes.  4. DIASTOLIC HEART FAILURE, CHRONIC: 8/08 echo with EF 55%.  5. EDEMA- LOCALIZED. Suspect diastolic CHF + venous insufficiency.  6. MIXED HYPERLIPIDEMIA 7. ? of SLEEP APNEA 8. DIABETES MELLITUS, TYPE II  9. TOBACCO ABUSE, HX OF  10. ALLERGIC RHINITIS  11. HYPERCHOLESTEROLEMIA  12. BENIGN PROSTATIC HYPERTROPHY  13. OSTEOARTHRITIS  14. Dementia (mild)--3/08 15. Hearing loss L>>R 16. CKD Ortho= [facility], Dr. Darrelyn Hillock  Review of Systems       per HPI  Physical Exam  General:  Well-developed,well-nourished,in no acute distress; alert,appropriate and cooperative throughout examination.  tired appearing Head:  Normocephalic and atraumatic without obvious abnormalities. No apparent alopecia or balding.  no sinus tenderness Eyes:  PERRLA, EOMI Mouth:  MMM, dentures upper and lower.  with lower removed, evident shallow ulcer midline anterior to dental ridge   Impression & Recommendations:  Problem # 1:  ORAL ULCER (ICD-528.9) right at denture ridge, likely chronically irritated.  recommend minimize denture use over next  1-2 wks, orajel for discomfort.  return if not resolved or if enalrging, bleeding, worsening.  pt agrees.  Complete Medication List: 1)  Glipizide 10 Mg Tb24 (Glipizide) .... Take 1 tablet by mouth once a day 2)  Bisoprolol Fumarate 5 Mg Tabs (Bisoprolol fumarate) .... Take one tablet by mouth daily 3)  Adult Aspirin Low Strength 81 Mg Tbdp (Aspirin) .... Take 1 tablet by mouth once a day 4)  Simvastatin 40 Mg Tabs (Simvastatin) .... Take 1 tablet by mouth once a day 5)  Symbicort 160-4.5 Mcg/act  Aero (Budesonide-formoterol fumarate) .... Inhale 2 puffs two times a day 6)  Metformin Hcl 1000 Mg Tabs (Metformin hcl) .[name].. 1 two times a day 7)  Spiriva Handihaler 18 Mcg Caps (Tiotropium bromide monohydrate) .... Two puffs in handihaler daily 8)  Hydrocodone-acetaminophen 10-325 Mg Tabs (Hydrocodone-acetaminophen) .... Take as directed for pain needed 9)  Furosemide 40 Mg Tabs (Furosemide) .[name].. 1 tablet by mouth daily 10)  Doxazosin Mesylate 4 Mg Tabs (Doxazosin mesylate) .[name].. 1-2 tabs daily as directed 11)  Cozaar 50 Mg Tabs (Losartan potassium) .... Take one tablet daily 12)  Orajel Mouth-aid 0.02-20-0.1 % Gel (Benzocaine) .... Apply to affected area twice daily  Patient Instructions: 1)  you have a small ulcer right where your lower dentures go in, likely irritating it and not letting it heal well. 2)  try to back off dentures as much as you can 3)  orajel for discomfort as needed. 4)  Return if getting larger, or worsening or other concerns. Prescriptions: ORAJEL MOUTH-AID 0.02-20-0.1 % GEL (BENZOCAINE) apply to affected area twice daily  #1 x 0   Entered and Authorized by:   Eustaquio Boyden  MD   Signed by:   Eustaquio Boyden  MD on 06/07/2010   Method used:   Electronically to        [facility]* (retail)       [address]       [place], [place]  [address]       Ph: [phone]       Fax: [phone]   RxID:   [id number]    Orders Added: 1)  Est. Patient Level III [35009]    Current Allergies (reviewed today): ! COUMADIN ! CODEINE ! ACE INHIBITORS LIPITOR (ATORVASTATIN CALCIUM) TICLID MORPHINE SULFATE (MORPHINE SULFATE)  Appended Document: MOUTH PAIN/CLE   AARP MEDICARE COMPLETE correction to HPI - quit smoking 2006.

## 2010-06-16 NOTE — Letter (Signed)
Summary: Bosie Clos Spaeth,O.D.,[facility] Avel Peace Spaeth,O.D.,[facility],Note   Imported By: Beau Fanny 06/07/2010 08:37:31  _____________________________________________________________________  External Attachment:    Type:   Image     Comment:   External Document

## 2010-06-16 NOTE — Progress Notes (Signed)
Summary: Drug Rxn?   Phone Note Call from Patient   Caller: Patient Summary of Call: Pt called c/o "soreness" in mouth/gums/tongue and states it is difficult to eat/swallow. Pt denies any swelling around mouth/tongue. He states its difficult to eat because it is painful and just sore and it is worse in the AM. Pt is wondering if this could be a result of any of his recent med changes. Pt d/c'd Avelox 2 weeks ago. Pt d/c'd Prednisone 4 days ago. Pt changed metoprolol to bisoprolol 5mg  once daily the end of January 2012. Pt states it has not gotten any better after stopping Prednisone. Please advise.     Appended Document: Drug Rxn? Unlikely to be related to bisoprolol.  May be a steroid-related problem such as candidal infection.  Needs to see PCP soon for evaluation and exam.   Appended Document: Drug Rxn? notified patient unlikely the bisoprolol causing the above problem.  Told may be a steroid-related problem such as candidal infection and told him should contact his PCP for an appt.  The patient will contact his PCP.

## 2010-06-27 ENCOUNTER — Ambulatory Visit: Payer: Self-pay | Admitting: Cardiology

## 2010-07-22 ENCOUNTER — Ambulatory Visit: Payer: Self-pay | Admitting: Cardiology

## 2010-07-26 LAB — POCT I-STAT 3, VENOUS BLOOD GAS (G3P V)
Bicarbonate: 21 mEq/L (ref 20.0–24.0)
pCO2, Ven: 40.6 mmHg — ABNORMAL LOW (ref 45.0–50.0)
pH, Ven: 7.323 — ABNORMAL HIGH (ref 7.250–7.300)
pO2, Ven: 37 mmHg (ref 30.0–45.0)

## 2010-07-26 LAB — POCT I-STAT 3, ART BLOOD GAS (G3+)
Bicarbonate: 23.1 mEq/L (ref 20.0–24.0)
O2 Saturation: 94 %
TCO2: 24 mmol/L (ref 0–100)
pCO2 arterial: 38.5 mmHg (ref 35.0–45.0)
pO2, Arterial: 71 mmHg — ABNORMAL LOW (ref 80.0–100.0)

## 2010-07-26 LAB — POCT I-STAT GLUCOSE: Glucose, Bld: 82 mg/dL (ref 70–99)

## 2010-08-18 ENCOUNTER — Ambulatory Visit (INDEPENDENT_AMBULATORY_CARE_PROVIDER_SITE_OTHER): Payer: MEDICARE | Admitting: Cardiology

## 2010-08-18 ENCOUNTER — Encounter: Payer: Self-pay | Admitting: Cardiology

## 2010-08-18 DIAGNOSIS — I2510 Atherosclerotic heart disease of native coronary artery without angina pectoris: Secondary | ICD-10-CM

## 2010-08-18 DIAGNOSIS — J441 Chronic obstructive pulmonary disease with (acute) exacerbation: Secondary | ICD-10-CM

## 2010-08-18 DIAGNOSIS — I509 Heart failure, unspecified: Secondary | ICD-10-CM

## 2010-08-18 DIAGNOSIS — N189 Chronic kidney disease, unspecified: Secondary | ICD-10-CM

## 2010-08-18 DIAGNOSIS — E785 Hyperlipidemia, unspecified: Secondary | ICD-10-CM

## 2010-08-18 DIAGNOSIS — I25810 Atherosclerosis of coronary artery bypass graft(s) without angina pectoris: Secondary | ICD-10-CM

## 2010-08-18 DIAGNOSIS — Z79899 Other long term (current) drug therapy: Secondary | ICD-10-CM

## 2010-08-18 DIAGNOSIS — I5032 Chronic diastolic (congestive) heart failure: Secondary | ICD-10-CM

## 2010-08-18 DIAGNOSIS — E7800 Pure hypercholesterolemia, unspecified: Secondary | ICD-10-CM

## 2010-08-18 NOTE — Assessment & Plan Note (Signed)
Patient appears less volume overloaded today.  Lower extremity edema essentially resolved and weight is down.  He is breathing better.  He has multifactorial dyspnea, likely due to a combination of diastolic CHF and COPD.  Continue current Lasix dose.  Will get BMET and BNP.

## 2010-08-18 NOTE — Progress Notes (Signed)
PCP: Dr. Sharen Hones  77 yo with history of CAD s/p CABG, diastolic CHF, COPD and CKD presents for cardiology evaluation.  At last appointment, patient reported increased dyspnea.  I restarted his Lasix and changed metoprolol to bisoprolol for more beta-1 selective beta blocker.  Since then, he thinks his breathing has improved.  He is able to walk 1000 feet to the mailbox and back without dyspnea.  He is doing more yardwork (was cutting bushes yesterday). No chest pain.  Back pain much improved since steroid injection about 1 month ago.  No claudication-type pain in his legs. He has lost about 14 lbs since I last saw him.  He has been working a lot on his diet.   ECG: NSR, RBBB, LAFB  Labs (5/11): K 5, creatinine 1.4, LDL 73, HDL 36 Labs (1/12): BNP 135 Labs (2/12): BNP 121, K 5.6 => 5.2, creatinine 1.34 => 1.38  Allergies (verified):  1)  ! Coumadin 2)  ! Codeine 3)  ! Ace Inhibitors 4)  Lipitor (Atorvastatin Calcium) 5)  Ticlid 6)  Morphine Sulfate (Morphine Sulfate)  Family History: Dad died@46 --accident Mom died @54 --MI 3 brothers--1 suicide 6 sisters--1 died lung cancer No HTN,DM No prostate or colon cancer  Social History: Retired--rubber Data processing manager Lives in [place] son Former Smoker--long time then quit 2006 Alcohol use-no Enjoys golf and yard work  Past Medical History: 1. CORONARY ARTERY DISEASE: s/p CABG 2006.  LHC (1/10): SVG-RCA, SVG-OM and PLOM, SVG-D, and LIMA-LAD were all patent.  2. HYPERTENSION  3. COPD: Moderate.  Followed by Dr. Delton Coombes.  4. DIASTOLIC HEART FAILURE, CHRONIC: 8/08 echo with EF 55%.  5. EDEMA- LOCALIZED. Suspect diastolic CHF + venous insufficiency.  6. MIXED HYPERLIPIDEMIA 7. ? of SLEEP APNEA 8. DIABETES MELLITUS, TYPE II  9. TOBACCO ABUSE, HX OF  10. ALLERGIC RHINITIS  11. HYPERCHOLESTEROLEMIA  12. BENIGN PROSTATIC HYPERTROPHY  13. OSTEOARTHRITIS  14. Dementia (mild)--3/08 15. Hearing loss L>>R 16. CKD with mild  hyperkalemia 17. Ruptured disc L-spine with low back pain.   Current Outpatient Prescriptions  Medication Sig Dispense Refill  . aspirin 81 MG EC tablet Take 81 mg by mouth daily.        . benzocaine (ORAJEL) 10 % mucosal gel Use as directed 1 application in the mouth or throat as needed.        . bisoprolol (ZEBETA) 5 MG tablet Take 5 mg by mouth daily.        . budesonide-formoterol (SYMBICORT) 160-4.5 MCG/ACT inhaler Inhale 2 puffs into the lungs 2 (two) times daily.        [name] doxazosin (CARDURA) 4 MG tablet Take 4 mg by mouth at bedtime. 1-2 tablets daily as directed       . furosemide (LASIX) 40 MG tablet Take 40 mg by mouth daily.        [name] glipiZIDE (GLUCOTROL) 10 MG 24 hr tablet Take 10 mg by mouth daily.        [name] HYDROcodone-acetaminophen (NORCO) 10-325 MG per tablet Take 1 tablet by mouth every 6 (six) hours as needed.        [name] losartan (COZAAR) 50 MG tablet Take 25 mg by mouth daily.       . metFORMIN (GLUCOPHAGE) 1000 MG tablet Take 1,000 mg by mouth 2 (two) times daily with a meal.        . simvastatin (ZOCOR) 40 MG tablet Take 40 mg by mouth at bedtime.        [name] tiotropium (SPIRIVA) 18  MCG inhalation capsule Place 18 mcg into inhaler and inhale daily.          BP 124/74  Pulse 65  Ht 5' 7.5" (1.715 m)  Wt 187 lb 12.8 oz (85.186 kg)  BMI 28.98 kg/m2 General:  Elderly man in no distress.  Neck:  Neck supple, JVP 7-8 cm. No masses, thyromegaly or abnormal cervical nodes. Lungs:  Slightly diminished breath sounds bilaterally.   Heart:  Non-displaced PMI, chest non-tender; regular rate and rhythm, S1, S2 without murmurs, rubs or gallops. Carotid upstroke normal, no bruit. 1+ left PT pulse, trace right PT pulse. Trace ankle edema on right.  Abdomen:  Bowel sounds positive; abdomen soft and non-tender without masses, organomegaly, or hernias noted. No hepatosplenomegaly. Extremities:  No clubbing or cyanosis. Neurologic:  Alert and oriented x 3. Psych:  Normal affect.

## 2010-08-18 NOTE — Assessment & Plan Note (Signed)
Stable CKD.  K was elevated but better with decreased losartan.  Repeating BMET.

## 2010-08-18 NOTE — Patient Instructions (Signed)
Continue with you current medications. Follow up in 6 months. Fasting Lipid/LFT & B MET next week.

## 2010-08-18 NOTE — Assessment & Plan Note (Signed)
Stable with no ischemic symptoms.  Continue ASA, beta blocker, statin, ARB.

## 2010-08-18 NOTE — Assessment & Plan Note (Signed)
Now on more beta-1 selective beta blocker.

## 2010-08-18 NOTE — Assessment & Plan Note (Signed)
Check lipids/LFTs.  Goal LDL < 70.  

## 2010-08-23 NOTE — Assessment & Plan Note (Signed)
[facility]                             PULMONARY OFFICE NOTE   Timothy Timothy Cordova, Timothy Cordova                   MRN:          [id number]  DATE:09/25/2006                            DOB:          04-Oct-1928    HISTORY OF PRESENT ILLNESS:  The patient is a 77 year old white male  patient of Dr. Kavin Leech who has a history of COPD, who returns today for  an acute office visit.  The patient was seen 1 week ago for a followup  for a recent COPD exacerbation that had resolved.  The patient reports  that he tapered off of his prednisone as recommended after finishing a 7-  day course of Avelox.  The patient was returning back to baseline.  However, over the last 2 days, he feels his symptoms are starting to  return, and has had some increased cough and congestion with kind of  thick whitish sputum, nasal congestion, and some fever and chills at  night.  A recent chest x-ray without any acute changes.  The patient  complains that he has been wheezing quite a bit this morning.  He denies  any chest pain, orthopnea, PND, or increased leg swelling.   PAST MEDICAL HISTORY:  Reviewed.   CURRENT MEDICATIONS:  Reviewed.   PHYSICAL EXAM:  The patient is an elderly male in no acute distress.  He is afebrile with stable vital signs.  O2 saturation is 97% on room  air.  HEENT:  Unremarkable.  NECK:  Supple without cervical adenopathy.  No JVD.  LUNGS:  Sounds reveal some coarse breath sounds bilaterally with a few  expiratory wheezes.  CARDIAC:  Regular rate and rhythm.  ABDOMEN:  Soft and nontender.  EXTREMITIES:  Warm without any calf tenderness, cyanosis, or clubbing.  There is trace edema.   IMPRESSION AND PLAN:  Recurrent chronic obstructive pulmonary disease  exacerbation.  The patient is recommended to add in Mucinex DM twice  daily.  We will re-challenge with a steroid taper over the next week.  The patient is to monitor blood sugars closely because he has a  history  of diabetes.  He is to call the office if blood sugars are above 200.  At this time, we will  hold on antibiotics.  The patient was given a Xopenex nebulizer  treatment in the office.  We will recheck here in 1 week with Dr. Delton Coombes  or sooner if needed.     Rubye Oaks, NP  Electronically Signed      [name], MD  Electronically Signed   TP/MedQ  DD: 09/26/2006  DT: 09/26/2006  Job #: [id number]

## 2010-08-23 NOTE — Assessment & Plan Note (Signed)
[facility]                             PULMONARY OFFICE NOTE   JAMASON, PECKHAM                   MRN:          [id number]  DATE:09/18/2006                            DOB:          June 05, 1928    HISTORY OF PRESENT ILLNESS:  Patient is a 77 year old white male patient  of Dr. Delton Coombes, who has a history of COPD, presents today for a two-week  followup.  Last visit, patient had a COPD exacerbation, was treated with  a seven-day course of Avelox and a prednisone taper.  Patient was also  started on Symbicort 160/4.5 two puffs twice daily.  Patient returns  today reporting that he is much improved with decreased cough,  congestion and shortness of breath.  He has four additional days of  prednisone left.  He has now finished his antibiotic course.  The  patient denies any purulent sputum, fever, chest pain, orthopnea, PND or  leg-swelling.   PAST MEDICAL HISTORY:  Reviewed.   CURRENT MEDICATIONS:  Reviewed.   PHYSICAL EXAM:  Patient is an elderly male, in no acute distress.  He is  afebrile with stable vital signs.  His O2 saturation is 96% on room air.  HEENT:  Unremarkable.  NECK:  Supple without adenopathy.  No JVD.  LUNG SOUNDS:  Diminished breath sounds in bases, otherwise clear.  CARDIAC:  Regular rate.  ABDOMEN:  Soft and nontender.  EXTREMITIES:  Warm without any edema.   IMPRESSION AND PLAN:  Recent COPD exacerbation, now resolving.  Patient  is to continue on Symbicort 160/4.5 two puffs twice daily, and taper off  prednisone as recommended.  Patient will follow back up with Dr. Delton Coombes  in four to six weeks, or sooner, if needed.      Rubye Oaks, NP  Electronically Signed      [name], MD  Electronically Signed   TP/MedQ  DD: 09/19/2006  DT: 09/19/2006  Job #: [id number]

## 2010-08-23 NOTE — Assessment & Plan Note (Signed)
[facility]                             PULMONARY OFFICE NOTE   IZAK, ANDING                   MRN:          [id number]  DATE:10/03/2006                            DOB:          Feb 16, 1929    PULMONARY FOLLOW-UP VISIT:   SUBJECTIVE:  Mr. Shinsato is a 77 year old man with a history of  moderate COPD, allergic rhinitis, coronary artery disease.  He has been  treated on three distinct occasions since the end of May for possible  COPD exacerbation and acute bronchitis with steroids and antibiotics.  I  have also changed him from Spiriva, which he used briefly but from which  he has not derived any benefit, over to Symbicort 160/4.5 mcg two puffs  b.i.d.  He returns today having just finished a prednisone taper.  He  tells me that his breathing is better.  He is still having severe  postnasal drip.  He is coughing every day but the sputum is not as  yellow as it was before he was treated.  Unfortunately, he has started  to experience some worsening urinary hesitancy and also a painful area  on his foreskin on his penis that sounds consistent with a yeast  infection, probably due to the prednisone.   Medications were reviewed and are correct on his clinic chart.   PHYSICAL EXAMINATION:  GENERAL:  This is a pleasant, elderly man in no  distress.  VITAL SIGNS:  His weight is 187 pounds, temperature 97.8, blood pressure  120/66, heart rate 66, SPO2 95% on room air.  HEENT:  His posterior pharynx is erythematous.  He has some evidence of  postnasal drip.  NECK:  Supple without lymphadenopathy or stridor.  LUNGS:  Distant but clear to auscultation bilaterally.  He has no  wheezing.  HEART:  Regular without murmur.  ABDOMEN:  Benign.  GENITOURINARY:  He does have evidence of significant erythema on his  penis and on his foreskin with some white areas consistent with Candida.  EXTREMITIES:  No cyanosis, clubbing or edema.   IMPRESSION:  1.  Chronic obstructive pulmonary disease with recent exacerbations      that may be more driven by his allergic rhinitis than by true      airflow limitation as he has not responded significantly to      antibiotics or steroids.  I will keep him on his Symbicort and we      will decide at a later date whether he may benefit from addition of      Spiriva.  2. Allergic rhinitis with poor control.  I would like to start him on      nasal saline washes once daily, a nasal steroid, which will be      Nasacort two sprays each nostril daily, and also loratadine once      daily.  3. Yeast infection.  I will treat him with fluconazole for 3 days.  If      he does not improve, then I      have asked him to follow up with Dr. Alphonsus Sias for further evaluation.  He may need to go to see urology if this does not resolve.     [name], MD  Electronically Signed    RSB/MedQ  DD: 10/03/2006  DT: 10/04/2006  Job #: [id number]   cc:   Karie Schwalbe, MD

## 2010-08-23 NOTE — Assessment & Plan Note (Signed)
[facility]                            CARDIOLOGY OFFICE NOTE   Timothy Cordova, Timothy Cordova                   MRN:          [id number]  DATE:07/02/2007                            DOB:          09/05/1928    PRIMARY CARE PHYSICIAN:  Tillman Abide, MD.   CLINICAL HISTORY:  [name] is 77 years old and returns for follow-  up management of his coronary heart disease.  He had bypass surgery in  2006 following a non-ST-elevation myocardial infarction.  He also had a  left subclavian stenosis stented following bypass surgery by Dr. Samule Ohm.  He has done quite well from a cardiac standpoint since that time. He has  had no recent chest pain or palpitations.  He does have shortness of  breath which he relates to his chronic obstructive pulmonary disease for  which he is seeing Dr. Solon Augusta.   PAST MEDICAL HISTORY:  Significant for diabetes, hypertension and  hyperlipidemia.  He also was a previous smoker prior to his bypass  surgery.  His last hemoglobin A1c was 9.7 but he was not on his current  medications which now consist of metformin and glipizide.   CURRENT MEDICATIONS:  1. Spiriva.  2. Glipizide.  3. Lopressor.  4. Zocor.  5. Lopressor 25 mg b.i.d.  6. Zocor 40 mg daily.  7. Glucotrol XL 10 mg daily.  8. Aspirin.  9. Doxazosin.  10.Metformin.  11.Symbicort.   PHYSICAL EXAMINATION:  On examination today, the blood pressure was  142/64 and the pulse 66 and regular.  He says his blood pressure at home  runs in the 140 to 145 range. There was no venous tension.  The carotid  pulses were full without bruits.  CHEST:  Clear.  CARDIAC:  Rhythm was regular.  I could hear no murmurs or gallops.  ABDOMEN:  Soft without organomegaly. Peripheral pulses were full and  there was no peripheral edema.   Electrocardiogram showed right bundle branch block and PVCs   IMPRESSION:  1. Coronary artery disease status post coronary bypass graft surgery  2006 now stable.  2. Good left ventricular function.  3. Hypertension not quite under optimal control.  4. Hyperlipidemia with consistent low HDL.  5. Diabetes with elevated hemoglobin A1c.  6. Status post left subclavian stenting following bypass surgery.   RECOMMENDATIONS:  I think [name] is doing well from the  standpoint of his heart, but his secondary prevention is not ideal.  His  blood pressure is still not at target and since he has diabetes, I think  the best course would be to add an ACE inhibitor and we started him on  Altace 5 mg a day.  We will get a BMP and a repeat blood pressure in a  month. Since his hemoglobin A1c was quite elevated before, we will get  that when he comes back and refer him sooner rather than later to Dr.  Alphonsus Sias if he is markedly abnormal.  I will plan to see him back in  follow-up in a year.     [name] [name], MD, [facility]  Electronically Signed  BRB/MedQ  DD: 07/02/2007  DT: 07/02/2007  Job #: [id number]

## 2010-08-23 NOTE — Assessment & Plan Note (Signed)
[facility]                            CARDIOLOGY OFFICE NOTE   ATLEE, KLUTH                   MRN:          [id number]  DATE:05/01/2008                            DOB:          1928-05-27    Timothy Cordova was added onto my schedule today.  He is a patient of Dr.  [name].  He was sent by Dr. Dallas Schimke for increasing shortness of breath  and chest pressure.  The patient has a history of coronary artery  disease.  He had distant history of atherectomy of the LAD and stenting  of the right coronary artery done by Dr. [name] subsequently in November  2006.  He was cathed by Dr. [name] and found have severe 3-vessel  disease.  He was referred for coronary artery bypass surgery by Dr.  [name].   Prior to this, he had stenting of the left subclavian by Dr. Samule Ohm, so  that the LIMA graft could be used.  The patient had left internal  mammary artery graft to the LAD, vein graft to first diagonal,  sequential vein graft to the obtuse marginal 1 and 2, and a vein graft  to the distal right coronary artery with endoscopic vein harvesting.   His LV function has been normal.  He is a previous smoker who carries a  diagnosis of moderate COPD and is followed by Dr. Delton Coombes.   The patient has had increasing shortness of breath over the last 3-4  months.  It is quite debilitating.  He has been treated with steroids  and antibiotics on 3 different occasions last year without much relief.  He is convinced there is a problem with his heart.  Lately over the last  3 months, he has also been having some pressure in his chest.  He has  not taken nitroglycerin for it.  It is a squeezing sensation.  It does  remind him of his coronary artery disease prior to his bypass and  symptoms are progressive.   In regards to his dyspnea, he has no history of pulmonary hypertension,  no PE, no DVT, no increased lower extremity edema.  He has not had a  cough or sputum  production recently.   However, he now says he can barely walk few blocks without getting very  dyspneic.   He has not had any significant palpitations or syncope.   His review of systems otherwise remarkable for significant lower back  pain.  He has been seeing Dr. Ethelene Hal for possible injections and is now  on generic Darvocet.   His review of systems otherwise negative.   PAST MEDICAL HISTORY:  Remarkable for coronary artery disease, coronary  artery bypass surgery in 2006, lumbar osteoarthritis, COPD, moderate  followed by Dr. Delton Coombes, diabetes, borderline hypertension with ramipril  recently started.   ALLERGIES:  To TICLID, MORPHINE, and COUMADIN.   The patient is married.  His wife is sick.  She is a cancer survivor, I  believe, with Hodgkin disease.  The patient quit smoking with his bypass  surgery.  He otherwise gets around fairly well and is only limited by  his back pain.  His son and daughter-in-law are with him today.   CURRENT MEDICATIONS:  1. Spiriva.  2. Glipizide 10 mg a day.  3. Metformin 1 g b.i.d.  4. Ramipril 2.5 a day.  5. Nasacort.   PHYSICAL EXAMINATION:  GENERAL:  Remarkable for an overweight male in no  distress.  VITAL SIGNS:  His weight is 200, blood pressure is 147/64, pulse 77 and  regular, respiratory 14, afebrile.  HEENT:  Unremarkable.  NECK:  Carotids are normal without bruit.  No lymphadenopathy,  thyromegaly, or JVP elevation.  LUNGS:  Clear with good diaphragmatic motion.  No wheezing.  CARDIAC:  S1 and S2.  Normal heart sounds.  PMI normal status post  tenotomy.  ABDOMEN:  Benign.  Bowel sounds positive.  No AAA, no tenderness, no  bruit, no hepatosplenomegaly, no hepatojugular reflux, or tenderness.  EXTREMITIES:  Distal pulses are intact with no edema.  He has some  decreased range of motion in the left shoulder and appears to have had a  previous left rotator cuff surgery.  NEURO:  Nonfocal.  SKIN:  Warm and dry.   IMPRESSION:   1. Chest pressure in the setting of previous bypass surgery.  Continue      aspirin therapy.  The patient will be referred for a heart      catheterization to assess graft patency.  Risk of catheterization      was discussed with the patient and he is willing to proceed.  He      will have preop labs and a chest x-ray  2. Dyspnea.  I do not think that this is related to his heart;      however, the patient is convinced that it is.  He carries a      diagnosis of moderate chronic obstructive pulmonary disease.  We      will check his chest x-ray today.  There are no signs of decreased      left ventricular function or elevated jugular venous pressure on      exam.  He will have a right heart catheterization at the time of      his cath to rule out pulmonary hypertension and assess his filling      pressures.  He will need closer followup with Dr. Delton Coombes should his      cath not show any significant coronary disease or elevated left      heart pressures.  3. Diabetes.  The patient checks his sugar probably once or twice a      week.  He says his fasting sugars have been under 110.  Encouraged      him to follow up with Dr. Dallas Schimke for hemoglobin A1c's quarterly.      We will have to hold his Glucophage prior to the cath.  4. Hypertension, borderline continue ramipril.  This was just started.      I suspect his dose can be escalated for renal protective effects as      well.  5. L4-L5 lumbar disk problem.  Follow up with Dr. Ethelene Hal.  Continue      p.r.n. Darvocet.  The patient is cleared to have injections if      needed.   We will try to arrange the patient's heart cath with Dr. [name] since  this is his primary cardiologist.  However, Dr. [name] is on vacation  next week and we may need to have a different physician performed the  catheterization.  Noralyn Pick. Eden Emms, MD, [facility]  Electronically Signed    PCN/MedQ  DD: 05/01/2008  DT: 05/01/2008  Job #: [id number]

## 2010-08-23 NOTE — Cardiovascular Report (Signed)
Timothy Cordova, Timothy Cordova            ACCOUNT NO.:  [id number]   MEDICAL RECORD NO.:  [id number]          PATIENT TYPE:  OIB   LOCATION:  1963                         FACILITY:  [facility]   PHYSICIAN:  Everardo Beals. [name], MD, FACCDATE OF BIRTH:  05-Aug-1928   DATE OF PROCEDURE:  DATE OF DISCHARGE:  05/15/2008                            CARDIAC CATHETERIZATION   CLINICAL HISTORY:  Mr. Pattison is 77 years old and had a coronary  bypass surgery in 2006.  Recently, he has had increased symptoms of  shortness of breath and chest pain and was seen by Dr. Eden Emms in the  office and scheduled for evaluation with right and left heart  catheterization.  He also has chronic obstructive pulmonary disease and  is followed by Dr. Delton Coombes for this.  He said he was told to stop his  ramipril prior to the catheterization, and he said his symptoms of  shortness breath improved rather dramatically when he did this.   PROCEDURE:  Left heart catheterization was performed percutaneously via  the right femoral artery using an arterial sheath and a 4-French  preformed coronary catheters.  A front wall arterial puncture was  performed, and Omnipaque contrast was used.  All the coronary injections  were able to be done with 2 catheters.  The [facility] was used for injection  of the LIMA catheter, as well as all the vein graft.  Right heart  catheterization was performed percutaneously via right femoral vein  using venous sheath and Swan-Ganz thermodilution catheter.  The patient  tolerated the procedure well and left the laboratory in satisfactory  condition.   RESULTS:  The left main coronary artery:  The left main coronary artery  is free of significant disease.   Left anterior descending artery:  Left anterior descending artery gave  rise to 2 diagonal branches, a septal perforator and a third diagonal  branch and then with competing flow distally.  There was a 90% stenosis  in the proximal LAD after the first diagonal  branch.  There was  competing flow in the second diagonal branch, which was grafted.   The circumflex artery:  The circumflex artery gave rise to a marginal  branch and was completely occluded.  There was 80% narrowing in the  proximal circumflex artery and 80% narrowing in the marginal branch.   The right coronary artery:  The right coronary artery was completely  occluded at its midportion.   The saphenous vein graft to the marginal and posterolateral branch of  the circumflex artery was patent and functioned well.  However, there  was only faint filling of the marginal branch.   The saphenous vein graft to the diagonal branch of the LAD was patent  and functioned normally.   The saphenous vein graft to the distal right coronary artery was patent  and functioned normally.   The LIMA graft to the LAD was patent and functioned normally.  This LAD  supplied a collateral to a inferior wall vessel, which may have been  distal portion of a posterior descending and posterolateral branch.   The subclavian stent was widely patent with less than  10% narrowing.  We  went through the stent for accessing the LIMA.   The left ventriculogram:  The left ventriculogram was performed in the  RAO projection showed good wall motion with no areas of hypokinesis.  The estimated ejection fraction was 60%.   HEMODYNAMIC DATA:  The right pressure was 12 mean.  Pulmonary artery  pressure was 47/19 with a mean of 32.  Pulmonary wedge pressure was 18  mean.  Left ventricular pressure was 169/27.  The aortic pressure was  169/62 with a mean of 108.  Cardiac output/cardiac index was 4.9/2.5  liters/minute/meter squared by Fick.   CONCLUSION:  1. Coronary artery disease status post coronary artery bypass graft      surgery in 2006.  2. Severe native vessel disease with 90% stenosis in the proximal left      anterior descending, 80% stenosis in the proximal circumflex artery      with total occlusion of  the mid circumflex artery, and 80%      narrowing in the first marginal branch, and total occlusion of the      mid right coronary artery.  Patent vein graft to the distal right coronary artery, patent sequential  vein graft to marginal and posterolateral branch of the circumflex  artery, patent vein graft to diagonal branch of the left anterior  descending, and patent left internal mammary artery graft to the left  anterior descending.  1. Patent subclavian stent with less than 10% narrowing.  2. Normal left ventricular function with an ejection fraction of 60%.  3. Mildly elevated pulmonary wedge pressure of 18 and moderately      elevated pulmonary artery pressure (47/19 with a mean of 32) and      moderately elevated pulmonary vascular resistance.   RECOMMENDATIONS:  There does not appear to be any source of ischemia.  The patient does have mild elevation in his filling pressures, and he  was hypertensive during the procedure, so we may consider a diuretic and  adjusting his blood pressure medicines.  His shortness of breath is much  better after going off the ramipril, so we will plan to leave him off  that for now.  If his symptoms of shortness of breath persist, we will  have him follow up begin with Dr. Delton Coombes.  I plan to see him back in 2-3  weeks.      [name] [name], MD, [facility]  Electronically Signed     BRB/MEDQ  D:  05/15/2008  T:  05/16/2008  Job:  [id number]   cc:   Noralyn Pick. Eden Emms, MD, [facility]  Ulyses Amor, MD

## 2010-08-26 NOTE — Discharge Summary (Signed)
Timothy Cordova, Timothy Cordova            ACCOUNT NO.:  [id number]   MEDICAL RECORD NO.:  [id number]          PATIENT TYPE:  INP   LOCATION:  2021                         FACILITY:  [facility]   PHYSICIAN:  Salvatore Decent. [name], M.D.DATE OF BIRTH:  08-06-1928   DATE OF ADMISSION:  03/03/2005  DATE OF DISCHARGE:  03/13/2005                                 DISCHARGE SUMMARY   HISTORY OF PRESENT ILLNESS:  This is a 77 year old male with a past medical  history of hypertension, diabetes, hyperlipidemia, tobacco abuse, and  coronary artery disease, who was admitted with chest pain.  He is a patient  of Dr. Regino Schultze.  He has a history of multiple previous PCIs but none in the  past ten years.  Over the past several days prior to this admission, he  described substernal chest pain as a tightness type sensation which radiated  to the left upper extremity.  It was not associated with shortness of  breath, nausea and vomiting, or diaphoresis.  It was not pleuritic or  positional and there was no associated water brash.  The pain became with  both exertion and at rest.  It was similar to his symptoms prior to a  previous PCI.  He had an episode for approximately one hour on the day of  admission at which time he presented to the emergency room.  He was felt to  require admission for further cardiology evaluation and treatment.   FAMILY HISTORY:  Positive for coronary artery disease.   SOCIAL HISTORY:  The patient does smoke but does not consume alcohol.   PAST MEDICAL HISTORY:  Significant for hyperlipidemia, diabetes mellitus,  hypertension, coronary artery disease, previous left shoulder surgery.   For review of systems and physical exam, please see the history and physical  done at the time of admission.   HOSPITAL COURSE:  The patient was admitted to the hospital.  EKG showed  normal sinus rhythm with right bundle branch block and no ischemic changes.  The patient was placed on a course of  medications including aspirin,  Lovenox, Lopressor, nitroglycerin, and statins and his Plavix was held due  to the likelihood of three vessel disease.  He was scheduled for cyclic  enzyme check to rule out myocardial infarction and scheduled for probable  cardiac catheterization.  The patient did rule in for non-ST segment  myocardial infarction.  The patient's peak troponin I was 1.83 and his peak  CK MB was 12.4.  Catheterization was scheduled and performed on March 06, 2005, by Dr. [name].  Findings included significant multivessel coronary  artery disease including 90% proximal LAD, 40% proximal and 80% stenosis in  the second diagonal, the circumflex had a 90% proximal, 90% obtuse marginal,  and 50% distal lesion, the right coronary artery had an 80% proximal lesion,  40% lesion at the stent, and a 95% distal lesion, the subclavian artery had  an 80% stenosis and subsequently he was scheduled for a stenting of this  which was done successfully by Dr. Randa Evens on March 07, 2005.  Due  to the significant findings on catheterization, surgical  consultation was  obtained with Dr. Charlett Lango who evaluated the patient, studies, and  agreed with recommentions to proceed with surgical revascularization.  On  March 08, 2005, the patient was taken to the operating room where he  underwent coronary artery bypass grafting x 5 with the following grafts  placed:  1.  Left internal mammary artery to the LAD.  2.  Saphenous vein graft sequentially to the first obtuse marginal and      second obtuse marginal.  3.  Saphenous vein graft to the diagonal.  4.  Saphenous vein graft to the distal right coronary artery.  Findings interoperatively showed diffusely diseased targets and an  intramyocardial LAD.  The patient tolerated the procedure well and was taken  to the surgical intensive care unit in stable condition.   Postoperative hospital course, the patient did well.  He was  extubated from  the ventilator without difficulty.  He did have a postoperative anemia that  required transfusion. His hemoglobin and hematocrit have stabilized and he  has been placed on both iron and folic acid.  He additionally had a  postoperative thrombocytopenia that also improved with time.  He did not  receive heparin in his flushes.  All routine lines, drains, monitors, were  discontinued in the standard fashion.  He has had no significant cardiac  ectopy or dysrhythmias.  He has required aggressive pulmonary toilet but  responded well and oxygen has been weaned.  He additionally has required  moderate diuresis for volume overload for which he is responding.  He will  require further postoperatively.  His diabetes has been under adequate  control and he has been started on an oral sulfonylurea medication.  His  glycosylated hemoglobin A1C on March 04, 2005, was measured at 7.8.  All  incisions were healing well without evidence of infection.  He is tolerating  routine activities commensurate to the level of postoperative convalescence  using post cardiac rehabilitation phase I modalities.  His overall status is  felt to be improving and stable for discharge today, March 13, 2005.   CONDITION ON DISCHARGE:  Stable and improving.   MEDICATIONS ON DISCHARGE:  1.  Aspirin 325 mg daily.  2.  Lopressor 25 mg b.i.d.  3.  Zocor 40 mg daily.  4.  Tylox 1-2 q.4-6h. p.r.n. pain.  5.  Glucotrol XL 10 mg daily.  6.  Folic acid 1 mg daily.  7.  Niferex 150 mg daily.  8.  Lasix 40 mg daily for seven days.  9.  K-Dur 20 mEq daily for seven days.   DISCHARGE INSTRUCTIONS:  The patient received written instructions regarding  medications, activity, diet, wound care, and follow up.  Follow up included  Dr. [name] in two weeks, Dr. [name] with the appointment to be called  to the patient.   FINAL DIAGNOSIS: 1.  Severe multivessel coronary artery disease with non-ST segment  elevation      myocardial infarction on admission now status post surgical      revascularization as well as left subclavian artery stenting.  2.  Postoperative anemia, stable.  3.  Postoperative thrombocytopenia, improved.  4.  Diabetes mellitus type 2.  5.  Hypertension.  6.  Hyperlipidemia.      Rowe Clack, P.A.-C.    ______________________________  Salvatore Decent [name], M.D.    Sherryll Burger  D:  03/13/2005  T:  03/13/2005  Job:  [id number]   cc:   Charlies Constable, M.D. [facility]  [address]  [address]  [address]  [place]  [place] [address]

## 2010-08-26 NOTE — Cardiovascular Report (Signed)
NAME:  Timothy Cordova, Timothy Cordova NO.:  [id number]   MEDICAL RECORD NO.:  [id number]          PATIENT TYPE:  INP   LOCATION:  4709                         FACILITY:  [facility]   PHYSICIAN:  Charlies Constable, M.D. [facility] DATE OF BIRTH:  08-15-1928   DATE OF PROCEDURE:  03/06/2005  DATE OF DISCHARGE:                              CARDIAC CATHETERIZATION   CLINICAL HISTORY:  Timothy Cordova is 77 years old and has had remote  percutaneous coronary interventions greater than 10 years ago including  stenting of the mid right coronary artery and rotational atherectomy of the  LAD.  We have not seen him in a number of years.  He was admitted to the  hospital with prolonged chest pain and positive enzymes consistent with a  non-ST segment elevation myocardial infarction.   PROCEDURE:  The procedure was performed via the right femoral arteries and  arterial sheath and 6 French preformed coronary catheters.  A frontal wall  arterial puncture was performed and Omnipaque contrast was used.  A  subclavian injection was performed to assess the internal mammary artery for  its suitable for bypass grafting.  A distal aortogram was performed to rule  out an abdominal aortic aneurysm.  The right femoral artery was closed with  AngioSeal at the end of the procedure.  The patient tolerated the procedure  well and left the laboratory in satisfactory condition.   RESULTS:  Aortic pressure was 146/68 with mean of 99.  Left ventricular pressure was 146/13.   Left main coronary artery:  The left main coronary artery was free of  significant disease.   Left anterior descending:  The left anterior descending coronary artery gave  rise to four diagonal branches and three septal perforators.  There was  moderately heavy calcification in the proximal and mid LAD.  There was a 90%  proximal stenosis and a 40% stenosis in the proximal to mid vessel.  Second  diagonal branch, which was the largest diagonal branch had  an 80% mid  stenosis.   Circumflex coronary artery:  The circumflex artery gave rise to a large  marginal branch and a posterolateral branch.  There was 90% proximal  stenosis in the circumflex artery.  There was 90% stenosis in the large  marginal branch which is a long segmental lesion.  There were multiple 50%  stenoses in the mid and distal circumflex artery.   Right coronary artery:  The right coronary artery is a moderate size vessel  that gave rise to two right ventricular branches, a posterior descending  branch and three small posterolateral branches.  There was a stent in the  mid vessel which had a diffuse 40% stenosis.  There was 80% stenosis just at  the proximal edge of the stent.  There was 95% stenosis in the distal  vessel.  The distal vessels were somewhat small.   Subclavian injection:  A subclavian injection showed an 80% stenosis at the  ostium of the subclavian artery with a patent internal mammary artery.  There was a 20 mm gradient across this lesion.   Left ventriculogram:  The left ventriculogram performed  in the RAO  projection showed good wall motion with no areas of hypokinesis.   Distal aortogram:  Distal aortogram was performed which showed patent renal  arteries bilaterally with no significant stenosis.  There were  irregularities in the aorta and iliacs but no major obstruction.   CONCLUSION:  1.  Severe three vessel coronary artery disease with 90% stenosis in the      proximal left anterior descending and 80% stenosis in the second large      diagonal branch, 90% stenosis in the proximal circumflex artery with 90%      stenosis in the first large marginal branch, 80% stenosis in the      proximal right coronary artery with 95% stenosis in the distal right      coronary artery and normal left ventricular function.  2.  An 80% left subclavian artery stenosis.   RECOMMENDATIONS:  Patient has severe three vessel disease which I think can  best be  treated by coronary bypass surgery.  He also has an 80% lesion in  the subclavian artery which threatens the internal mammary artery.  I  discussed the situation with Dr. Samule Ohm and we plan to obtain surgical  consultation with plans to stent the subclavian artery followed by bypass  surgery.           ______________________________  Charlies Constable, M.D. [facility]     BB/MEDQ  D:  03/06/2005  T:  03/06/2005  Job:  [id number]   cc:   Dr. Orlie Dakin   Cardiopulmonary Lab

## 2010-08-26 NOTE — H&P (Signed)
NAME:  Timothy Cordova, Timothy Cordova NO.:  [id number]   MEDICAL RECORD NO.:  [id number]          PATIENT TYPE:  INP   LOCATION:  4703                         FACILITY:  [facility]   PHYSICIAN:  Olga Millers, M.D. LHCDATE OF BIRTH:  01-14-1929   DATE OF ADMISSION:  03/03/2005  DATE OF DISCHARGE:                                HISTORY & PHYSICAL   IDENTIFYING DATA AND CHIEF COMPLAINT:  The patient is a 77 year old male  with a past medical history of hypertension, diabetes mellitus,  hyperlipidemia, tobacco abuse, and coronary artery disease  who is admitted  with chest pain.   HISTORY OF THE PRESENT ILLNESS:  The patient is a patient of Dr. Regino Schultze.  He has a history of multiple previous PCIs, but none in the past 10 years.  I do not have records available concerning this previous cardiac history.  Over the past several days he has noticed substernal chest pain.  It is  described as a tightness.  The pain radiates to his left upper extremity.  It is not associated with shortness of breath,  nausea, vomiting or  diaphoresis.  It is not pleuritic or positional, nor is there an associated  water brash.  It can occur with exertion and at rest.  It is similar to his  symptoms prior to his previous PCI.  He has had this episode for  approximately one hour today and presented to the emergency room.  He is  presently pain-free.   ALLERGIES:  The patient has an allergy to COUMADIN, TICLID and CODEINE.   MEDICATIONS:  The patient's medications at home include aspirin and Zocor 40  mg p.o. at bedtime.  According to his son does have a history of  hypertension and diabetes mellitus, and was on medications for these; but,  he discontinued these on his own.   FAMILY HISTORY:  The patient's family history is positive for coronary  artery disease.   SOCIAL HISTORY:  The patient does smoke, but does not consume alcohol.   PAST MEDICAL HISTORY:  The patient's past medical history is  significant for  hyperlipidemia.  There is also a history of diabetes mellitus and  hypertension per his son's report, but the patient denies this.  He has a  history of coronary artery disease as outlined in the HPI.  He has had prior  left shoulder surgery.  There is no other significant past medical history  noted.   REVIEW OF SYSTEMS:  The patient denies any headaches, fevers or chills.  There is no productive cough or hemoptysis.  There is no dysphagia,  odynophagia, melena or hematochezia.  There is no dysuria or hematuria.  There is no history of amaurosis fugax.  There is no orthopnea, PND or pedal  edema.  There is no claudication noted.  The remainder of the systems are  negative.   PHYSICAL EXAMINATION:  VITAL SIGNS:  The patient's physical exam today shows  a blood pressure of 173/76 an his pulse is 71.  He is afebrile.  He is 98%  on room air.  GENERAL APPEARANCE:  The patient is well-developed and  well-nourished, and  in no acute distress.  SKIN:  The skin is warm and dry.  PSYCHIATRIC:  The patient is not _________ depressed.  HEENT:  The patient's head, eyes, ears, nose and throat is unremarkable with  no __________ .  NECK:  The patient's neck is supple with normal motion bilaterally.  I  cannot appreciate bruits.  There is no jugular venous distention and I  cannot appreciate thyromegaly.  CHEST:  The patient's chest is clear to auscultation other than a mild  expiratory wheeze.  Expansion is normal.  HEART:  The patient's cardiovascular exam reveals regular rate and rhythm.  Normal S1 and S2. There are no murmurs, rubs or gallops noted.  ABDOMEN:  The abdominal exam is nontender and nondistended.  Positive bowel  sounds.  No hepatosplenomegaly.  No masses appreciated.  There is no  abdominal bruit.  VASCULAR: The patient has 2+ femoral pulses bilaterally and no bruits.  EXTREMITIES:  The patient's extremities show no edema and I can palpate no  cords.  There are  mild varicosities noted.  He has 2+ posterior tibial  pulses bilaterally.  NEUROLOGIC:  The patient's neurological exam is grossly intact.   LABORATORY DATA:  The patient's electrocardiogram showed a normal sinus  rhythm with a rate of 69.  The axis is normal. There is a right bundle  branch block.  There are no acute ST-T changes.   DIAGNOSES:  1.  Unstable angina.  2.  History of coronary artery disease status post status post previous      percutaneous coronary intervention (no records available).  3.  Tobacco abuse.  4.  Hypertension.  5.  Hyperlipidemia.  6.  History of diabetes mellitus.   PLAN:  Mr. Oriordan presents with chest pain that is extremely concerning  given his cardiac history and multiple risk factors.  We will plan to admit  and rule out myocardial infarction with serial enzymes.  He will be laced on  telemetry.  We will treat him with aspirin, Lovenox, Lopressor,  nitroglycerin, and a Statin.  I will hold Plavix as I feel there is a high  likelihood of three-vessel disease.  We will plan to proceed with cardiac  catheterization (the risks and benefits have been discussed with the patient  and he agrees to proceed).  We will check a hemoglobin A-1-C as the  patient's son does state that he h.s. a history of diabetes mellitus, but  the patient stopped both his medications __________  and his blood pressure  n his own.  He also discontinued his __________ .           ______________________________  Olga Millers, M.D. [facility]     BC/MEDQ  D:  03/03/2005  T:  03/04/2005  Job:  [id number]

## 2010-08-26 NOTE — Consult Note (Signed)
NAMEDANNER, PAULDING            ACCOUNT NO.:  [id number]   MEDICAL RECORD NO.:  [id number]          PATIENT TYPE:  INP   LOCATION:  4709                         FACILITY:  [facility]   PHYSICIAN:  Salvatore Decent. [name], M.D.DATE OF BIRTH:  1928-05-13   DATE OF CONSULTATION:  03/07/2005  DATE OF DISCHARGE:                                   CONSULTATION   REASON FOR CONSULTATION:  Three vessel coronary disease status post non-Q  wave myocardial infarction.   HISTORY OF PRESENT ILLNESS:  Mr. Breit is a 77 year old gentleman with a  history of coronary disease with multiple previous percutaneous  interventions, the last one approximately 10 years ago. He presented on  March 03, 2005 with a 3 to 4 day history of chest tightness. He said he  thought this initially was indigestion as it was in the center of his chest  and radiated up into his neck. There was a slight burning sensation and  feeling of tightness, but no true shortness of breath, nausea, vomiting, or  diaphoresis. He then began having some of the pain radiate into his left  arm. It was exacerbated by exertion but he also had several episodes at  rest. He had an episode that lasted approximately 1 hour on the day of  admission and presented to the emergency room. He was admitted for rule out  MI and did rule in with positive enzymes. He remained stable and had no  further pain over the weekend and then underwent cardiac catheterization  yesterday, which showed severe 3 vessel coronary disease but preserved left  ventricular function. He did also have an approximately 80% proximal left  subclavian stenosis. The patient is currently pain free.   PAST MEDICAL HISTORY:  Significant for coronary artery disease with previous  percutaneous interventions, adult onset type 2 diabetes, for which he had  been on medications which he stopped prior to admission, hypertension,  dyslipidemia, and previous left shoulder surgery.   ADMISSION MEDICATIONS:  1.  Aspirin 325 mg p.o. daily.  2.  Zocor 40 mg p.o. q.h.s.  3.  He had been on Actos but discontinued it secondary to cost.   ALLERGIES:  COUMADIN, TICLID, MORPHINE, which causes an upset stomach.   FAMILY HISTORY:  Remarkable for being strongly positive for coronary  disease. He has a younger brother who has had bypass surgery as well as a  sister who has had multiple cardiac operations.   SOCIAL HISTORY:  He is married to his wife for greater than 50 years. He is  retired. He smoked up until last Tuesday, 2 to 3 packs per day for many  years.   REVIEW OF SYSTEMS:  Occasional wheezing for which he uses an inhaler at  home. He has not had any recent severe episodes of COPD exacerbation. He has  a chronic cough but no hemoptysis. Cough has been non-productive. He has not  had any recent fevers or chills. He denies any subclavian steal type  symptoms. He does have hearing loss bilaterally and arthritis. No history of  any excessive bleeding or bruising. No claudication. All other systems are  negative.   PHYSICAL EXAMINATION:  GENERAL:  Mr. Vanpatten is a 77 year old white male  in no acute distress. On general appearance, he is well developed and well  nourished.  VITAL SIGNS:  Blood pressure 140/80, pulse 82 and regular, respiratory rate  16, 90% sat on room air. He is 5 feet 8 inches tall and 172 pounds.  HEENT:  Unremarkable.  NECK:  Supple. No thyromegaly, adenopathy, or bruits.  LUNGS:  Clear with equal breath sounds bilaterally. No wheezing at the  present time.  CARDIAC:  Regular rate and rhythm. Normal S2 and S2. No murmur, rub, or  gallop.  ABDOMEN:  Soft, nontender.  EXTREMITIES:  He has equal 2+ radial pulses bilaterally. The blood pressure  previously mentioned was measured in his left arm. He has 2+ posterior  tibial pulses bilaterally. There are no significant varicosities.  NEUROLOGIC:  Alert and oriented times three with no obvious focal  deficits.   LABORATORY DATA:  White count 7.1, hematocrit 40, platelets 185.  PT 11.7.  Cholesterol 158, HDL 39, LDL 102, triglycerides 87, sodium 136, potassium  4.2, BUN and creatinine 21 and 1.2. Glucose was 169. TSH 1.9.   Cardiac enzymes:  Peak CK was 121 with an MB of 12.8. His peak troponin was  1.83.   IMPRESSION:  Mr. Henard is a 77 year old gentleman with multiple cardiac  risk factors and a known history of coronary disease. He now presents with  non-Q wave myocardial infarction and at catheterization has severe 3 vessel  coronary disease. He also had a left subclavian artery stenosis, which was  angioplastied and stented today and has had a good result from that  procedure. Coronary artery bypass grafting is indicated for survival benefit  and relief of symptoms and is the optimal treatment for his coronary artery  disease.   PLAN:  I discussed in detail with the patient and his wife the nature and  extent of the surgery including incisions to be used, the expected length of  operation and expected hospital stay and overall recovery. They understand  that the risks of surgery include but are not limited to death, stroke,  myocardial infarction, deep vein thrombosis, pulmonary embolism, bleeding,  possible need for transfusions, infections, as well as other organ system  dysfunction, including respiratory, renal, hepatic or gastrointestinal  complications. He understands and accepts these risks and agrees to proceed.  Will plan on proceeding with surgery tomorrow morning with the first case.  All the patient's and wife's questions were answered.           ______________________________  Salvatore Decent [name], M.D.     SCH/MEDQ  D:  03/07/2005  T:  03/08/2005  Job:  [id number]   cc:   Charlies Constable, M.D. [facility]  [address]  [address]  [place]  [place] [address]

## 2010-08-26 NOTE — Op Note (Signed)
NAME:  Timothy Cordova, Timothy Cordova                      ACCOUNT NO.:  [id number]   MEDICAL RECORD NO.:  [id number]                   PATIENT TYPE:  AMB   LOCATION:  DAY                                  FACILITY:  [facility]   PHYSICIAN:  Georges Lynch. Gioffre, M.D.             DATE OF BIRTH:  [birth year]   DATE OF PROCEDURE:  09/22/2003  DATE OF DISCHARGE:                                 OPERATIVE REPORT   SURGEON:  Dr. Darrelyn Hillock.   ASSISTANT:  Terie Purser, PA   PREOPERATIVE DIAGNOSES:  Complete tear of the rotator cuff tendon on the  left secondary to severe impingement syndrome.   POSTOPERATIVE DIAGNOSIS:  Complete tear of the rotator cuff tendon on the  left secondary to severe impingement syndrome.   OPERATION:  1. Partial acromionectomy and acromioplasty on the left.  2. GraftJacket tendon graft to the rotator cuff tendon on the left.   DESCRIPTION OF PROCEDURE:  Under general anesthesia, the patient had a  routine orthopedic prep and draping of the left shoulder.  Prior to his  general anesthetic, he had an interscalene nerve block done in the holding  area.  He was given 1 g of IV Ancef.  Anterior approach to the shoulder was  carried out.  Bleeders identified and cauterized.  I stripped the deltoid  tendon from the acromion in the usual fashion.  I then went on and  identified a severe impingement syndrome from overgrowth of the acromion.  In fact, it literally shredded the rotator cuff.  The cuff appeared as  though it went through a shredder.  At this particular time, we protected  the remaining cuff with the Bennett retractor and did a partial  acromionectomy, then utilized the bur and did a acromioplasty to even out  the undersurface of the acromion.  We thoroughly irrigated out the area and  then utilized a small GraftJacket and reinforced the shredded tear.  We  utilized 3 multi-tack sutures in the proximal humerus to anchor the distal  part of the tendon down in place.  Following  this,, we reapproximated the  deltoid tendon and muscle in the usual fashion.  The subcu was closed with 0  Vicryl, skin with metal staples, and a sterile Neosporin dressing was  applied.  The patient was placed in a shoulder immobilizer.                                               Ronald A. Darrelyn Hillock, M.D.    RAG/MEDQ  D:  09/22/2003  T:  09/22/2003  Job:  [id number]

## 2010-08-26 NOTE — Op Note (Signed)
NAMEYARDLEY, LEKAS NO.:  [id number]   MEDICAL RECORD NO.:  [id number]          PATIENT TYPE:  INP   LOCATION:  4709                         FACILITY:  [facility]   PHYSICIAN:  Salvadore Farber, M.D. LHCDATE OF BIRTH:  03/03/1929   DATE OF PROCEDURE:  03/07/2005  DATE OF DISCHARGE:                                 OPERATIVE REPORT   PROCEDURE:  Stent to the left subclavian artery, Starclose closure of the  right common femoral arteriotomy site.   INDICATION:  Mr. Mozer is a 77 year old gentleman who presents with  small non-ST-segment elevation myocardial infarction. He underwent cardiac  catheterization by Dr. [name] yesterday. This demonstrated severe 3-vessel  coronary disease. Drs. [name] and Crenshaw referred him for coronary artery  bypass grafting. Noted at the time of catheterization was a 70% stenosis of  the proximal portion of the left subclavian artery. There was a 20 mmHg  translational gradient as assessed by catheter pullback. I was asked to  perform a percutaneous revascularization of this left subclavian artery to  optimize inflow to the LIMA.   PROCEDURAL TECHNIQUE:  Informed consent was obtained. Under 1% lidocaine  local anesthesia, a 6-French shell sheath was placed in the left common  femoral arteries using the modified Seldinger technique. A pigtail catheter  was advanced to the ascending aorta. Arch aortography was performed by power  injection. The JR-4 catheter was then used to selectively engage the left  subclavian. A Wholey wire was advanced beyond the stenosis without  difficulty. Catheter was then advanced over the wire, beyond the stenosis,  and used to exchange for a long J wire. The patient had received Lovenox  subcutaneously 7-1/2 hours prior to the procedure. Initially no additional  anticoagulation was given. However, during the procedure, I noticed some  clot formation on the wires. I, therefore, gave an additional 0.3  mg/kg of  IV enoxaparin. I then directly stented the lesion using a 7 x 39 genesis  stent deployed at 8 atmospheres. I then postdilated all but the most distal  3 mm of the stent using a 9 x 30 mm POWERSAIL at 10 atmospheres. Final  angiography demonstrated no residual stenosis, no dissection, and normal  flow to the distal vasculature.   The arteriotomy was then closed using a size StarClose device. Complete  hemostasis was obtained. He was then transferred to the holding room in  stable condition having tolerated the procedure well.   COMPLICATIONS:  None.   IMPRESSION/PLAN:  Successful stenting of the left subclavian artery.  Will  plan on beginning Plavix, when safe, after his coronary artery bypass  grafting operation which is currently scheduled for tomorrow. Lovenox will  be continued in the interim.      Salvadore Farber, M.D. [facility]  Electronically Signed     WED/MEDQ  D:  03/07/2005  T:  03/07/2005  Job:  [id number]   cc:   Charlies Constable, M.D. [facility]  [address]  [address]  [place]  [place] [address]

## 2010-08-26 NOTE — Assessment & Plan Note (Signed)
[facility]                            CARDIOLOGY OFFICE NOTE   EWAN, GRAU                   MRN:          [id number]  DATE:06/12/2006                            DOB:          June 04, 1928    PRIMARY CARE PHYSICIAN:  Karie Schwalbe, M.D.   CLINICAL HISTORY:  Mr. Jent is 77 years old and returned for a  follow up cardiac check.  He had been lost to follow up from cardiology  since his bypass surgery in 2006, but I did a recent intervention on his  son and we got him back in the system.  He had a non-ST elevation  infarction in November of 2006 and was found to have 3 vessel disease  and underwent bypass surgery.  He had a left subclavian stenosis, and we  stented the subclavian artery following bypass surgery.  Dr. Samule Ohm did  this.   He has done well from the standpoint of his heart.  There were no recent  chest pains or palpitations.  He had a recent viral infection that has  been treated by Dr. Alphonsus Sias, and he has had shortness of breath  associated with this.   PAST MEDICAL HISTORY:  1. Hypertension.  2. Hyperlipidemia.  3. Diabetes.  4. He is a previous smoker, but quit just before his bypass surgery.   CURRENT MEDICATIONS:  1. Lopressor.  2. Zocor.  3. Glucotrol.  4. Aspirin.  5. He has recently been on amoxicillin.  6. He has recently been on prednisone.   PHYSICAL EXAMINATION:  VITAL SIGNS:  Blood pressure is 110/46, pulse 65  and regular.  NECK:  There was no venous distention.  The carotid pulses were full  without bruits.  CHEST:  Clear.  HEART:  Rhythm was regular.  I could hear no murmurs or gallops.  ABDOMEN:  Soft with normal bowel sounds.  Peripheral pulses were full.  There was no peripheral edema.   ELECTROCARDIOGRAM:  Right bundle branch block, and was otherwise normal.   IMPRESSION:  1. Coronary artery disease, status post non-ST elevation infarction      and coronary bypass graft surgery in  November of 2006.  2. Good left ventricular function.  3. Hypertension.  4. Hyperlipidemia.  5. Diabetes.  6. Status post left subclavian stenting following bypass surgery.  7. Recent viral infection.   RECOMMENDATIONS:  I think Mr. Djordjevic is doing well from the  standpoint of his heart.  We gave him a prescription for nitroglycerin  spray to carry with him.  Dr. Alphonsus Sias has been managing his lipids.  He  was not in target for either his HDL or LDL last September, but he has  had some problem with tolerating Lipitor, and he is currently on  simvastatin.  I will defer that to Dr. Alphonsus Sias.  I encouraged him as soon  as he gets through this flu-like illness to work on regular exercise and  watch his weight.  Will plan to see him back for cardiac follow up in a  year.     [name] [name], MD, [facility]  Electronically Signed  BRB/MedQ  DD: 06/12/2006  DT: 06/13/2006  Job #: [id number]

## 2010-08-26 NOTE — Op Note (Signed)
NAMEKEMONTE, ULLMAN            ACCOUNT NO.:  [id number]   MEDICAL RECORD NO.:  [id number]          PATIENT TYPE:  INP   LOCATION:  2301                         FACILITY:  [facility]   PHYSICIAN:  Salvatore Decent. [name], M.D.DATE OF BIRTH:  13-Apr-1928   DATE OF PROCEDURE:  03/08/2005  DATE OF DISCHARGE:                                 OPERATIVE REPORT   PREOPERATIVE DIAGNOSIS:  Three-vessel coronary disease status post non-Q-  wave myocardial infarction.   POSTOPERATIVE DIAGNOSIS:  Three-vessel coronary disease status post non-Q-  wave myocardial infarction.   PROCEDURE:  Median sternotomy, , extracorporeal circulation, coronary bypass  grafting x5 (left internal mammary artery to LAD, saphenous vein graft to  first diagonal, sequential saphenous vein graft to obtuse marginal 1 and 2,  saphenous vein graft to distal right coronary), endoscopic saphenous vein  harvest from right leg.   SURGEON:  Salvatore Decent. [name], M.D.   ASSISTANT:  Stephanie Acre Dominick, PA.   ANESTHESIA:  General.   FINDINGS:  Diffusely diseased fair to poor quality target vessels, LAD  intramyocardial and poor candidate for redo grafting.   CLINICAL NOTE:  Mr. Bocock is a 77 year old diabetic gentleman with  multiple cardiac risk factors. He presented with a non-Q-wave myocardial  infarction and at catheterization had severe three-vessel coronary disease  and also had a left subclavian artery stenosis. He underwent angioplasty and  stenting of the left subclavian artery the day prior to surgery.  The  patient was advised to undergo coronary bypass grafting for survival benefit  and relief of symptoms.  The indications, risks, benefits and alternatives  were discussed in detail with the patient.  He  understood, accepted risks  and agreed to proceed.   OPERATIVE NOTE:  Mr. Conigliaro was brought to the preop holding area on  March 08, 2005.  There, anesthesia placed lines to monitor arterial  central  venous and pulmonary arterial pressure. EKG leads were placed for  continuous telemetry. Intravenous antibiotics were administered.  He was  taken to the operating room, anesthetized and intubated. A Foley catheter  was placed. The chest, abdomen and legs were prepped and draped in the usual  fashion.   A median sternotomy was performed and the left internal mammary artery was  harvested using standard technique.  The patient was fully heparinized prior  to dividing the distal end of the mammary artery. There was good flow  through the cut end of the vessel.   Simultaneously, incision was made in the medial aspect of the right leg and  the greater saphenous vein was harvested from the groin to the mid calf  endoscopically. The vein was of good quality.   The pericardium was opened. The ascending aorta was inspect and palpated.  There was no palpable atherosclerotic disease. The aorta was cannulated via  concentric 2-0 Ethibond pledgeted pursestring sutures. A dual stage venous  cannula placed via pursestring suture in the rectal appendage.  Cardiopulmonary bypass was instituted and the patient's cooled to 32 degrees  Celsius. The coronary arteries were inspected and anastomotic sites were  chosen.  Of note, the coronaries were diffusely diseased  poor quality  vessels. The LAD was intramyocardial. The conduits were inspected and cut to  length.  The foam pad was placed in the pericardium to stabilize phrenic  nerve. Temperature probe was placed in the myocardial septum and a  cardioplegic cannula placed in the ascending aorta.   The aorta was crossclamped, the left ventricle was emptied via the aortic  root bank and cardiac arrest then was achieved with a combination of cold  antegrade blood cardioplegia and topical iced saline.  The following distal  anastomoses were performed.   First, a reverse saphenous vein graft was placed end-to-side to the distal  right coronary artery. The  distal right coronary gave rise to four to five  small branches, none of which were large enough to graft individually,  therefore the graft was placed into the distal right coronary. This was a  large caliber, but only fair quality, vessel as it had significant plaque.  One probe did pass into the posterior descending branch without resistance.  The vein graft was anastomosed end-to-side with running 7-0 Prolene suture.  There was good flow through the graft. Cardioplegia was administered. There  was good hemostasis.   Next, a reverse saphenous vein graft was placed sequentially to obtuse  marginals one and two.  These were both diffusely diseased, small quality,  poor quality targets.  The vessels with only accept a 1 mm probe even though  they were both approximately 1.5 mm in diameter. There were both diffusely  diseased.  Side-to-side anastomosis was performed to OM1 and end-to-side to  OM2.  Both were performed with running 7-0 Prolene sutures. Both anastomoses  were probed proximally and distally to ensure patency.  The graft flushed  easily. Cardioplegia was administered. There was good hemostasis.   Next, a reverse saphenous vein graft was placed end-to-side to the first  diagonal branch of the LAD. This was a small 1 mm poor quality target. The  anastomosis was performed with a running 7-0 Prolene suture.  There was  satisfactory flow and good hemostasis with cardioplegia administration.   Next, the left internal mammary artery was brought through a window in the  pericardium. The distal end was spatulated and prepared for grafting. The  LAD was dissected out.  As noted, it was an intramyocardial vessel. A end-to-  side anastomosis was performed with a running 8-0 Prolene suture.  At the  completion of the anastomosis the bulldog clamp was removed from the mammary  artery. Septal rewarming was noted. The bulldog clamp was replaced and the mammary pedicle was tacked to the  epicardial surface of the heart with 6-0  Prolene sutures.   Additional cardioplegia was administered. The vein grafts were cut to  length. The proximal vein graft anastomoses were performed to 4.5 mm punch  aortotomies with running 6-0 Prolene sutures.  After completion of the final  proximal anastomosis, lidocaine was administered. The patient was placed in  Trendelenburg position. The aortic root was de-aired.  The bulldog clamp was  again removed from the left mammary artery and the aortic crossclamp was  removed. Total crossclamp time was 90 minutes.   All proximal and distal anastomoses were inspected for hemostasis.  Epicardial pacing wires were placed on the right ventricle and right atrium.  When the patient had rewarmed to a core temperature 37 degrees Celsius, he  was weaned from cardiopulmonary bypass without difficulty. The total bypass  time was 127 minutes. Initial cardiac index was greater than 2 liters per  minute per sq m and the patient remained hemodynamically stable throughout  post bypass period.   A test dose protamine was administered and was well tolerated.  The atrial  and aortic cannulae were removed. The remainder of the protamine was  administered without incident. The chest was irrigated with 1 liter of warm  normal saline containing 1 g of vancomycin. Hemostasis was achieved.  The  left pleural and two mediastinal chest tubes were placed through separate  subcostal incisions. The pericardium was reapproximated with interrupted 3-0  silk sutures that came easily without tension.  The sternum then was closed  with a combination of simple and figure-of-eight heavy gauge stainless steel  wires. The pectoralis fascia was closed with running #1 Vicryl suture.  The  subcutaneous tissue was closed running 2-0 Vicryl suture and the skin was  closed with 3-0 Vicryl subcuticular suture. All sponge, needle and  instrument counts were correct at the end of the  procedure. The patient was  taken from the operating room to the surgical intensive care unit in  critical but stable condition. The patient is a poor candidate for redo  grafting.           ______________________________  Salvatore Decent. [name], M.D.     SCH/MEDQ  D:  03/08/2005  T:  03/09/2005  Job:  [id number]   cc:   Charlies Constable, M.D. [facility]  [address]  [address]  [place]  [place] [address]

## 2010-09-08 ENCOUNTER — Telehealth: Payer: Self-pay | Admitting: Emergency Medicine

## 2010-09-08 MED ORDER — TIOTROPIUM BROMIDE MONOHYDRATE 18 MCG IN CAPS: 18.0000 ug | ORAL_CAPSULE | Freq: Every day | RESPIRATORY_TRACT | Status: DC

## 2010-09-08 NOTE — Telephone Encounter (Signed)
Called 547 number provided and this is the wrong number. Called pt's home and spoke with pt and notified that we refilled his spiriva.

## 2010-10-14 ENCOUNTER — Other Ambulatory Visit: Payer: Self-pay | Admitting: *Deleted

## 2010-10-14 MED ORDER — BUDESONIDE-FORMOTEROL FUMARATE 160-4.5 MCG/ACT IN AERO: 2.0000 | INHALATION_SPRAY | Freq: Two times a day (BID) | RESPIRATORY_TRACT | Status: DC

## 2010-11-14 ENCOUNTER — Telehealth: Payer: Self-pay | Admitting: *Deleted

## 2010-11-14 MED ORDER — LEVALBUTEROL TARTRATE 45 MCG/ACT IN AERO: 2.0000 | INHALATION_SPRAY | RESPIRATORY_TRACT | Status: DC | PRN

## 2010-11-14 NOTE — Telephone Encounter (Signed)
Yes, #1

## 2010-11-14 NOTE — Telephone Encounter (Signed)
Pt requesting refill for Xopenex HFA, I didn't see record of it in chart so I called pharmacy. It was last filled on 05/07/08 , written by Dr Patsy Lager. Directions were 2 puffs q 4 hours prn wheeze and sob.  Is it ok for pt to have this rx again?

## 2010-11-14 NOTE — Telephone Encounter (Signed)
rx sent to [facility] as directed

## 2010-11-17 ENCOUNTER — Telehealth: Payer: Self-pay | Admitting: *Deleted

## 2010-11-17 NOTE — Telephone Encounter (Signed)
completed

## 2010-11-17 NOTE — Telephone Encounter (Signed)
Prior auth needed for xopenex, form is on your desk.

## 2010-11-18 NOTE — Telephone Encounter (Signed)
Prior auth given for xopenex, advised pharmacy.  Approval letter placed on doctor's desk for signature and scanning. 

## 2010-11-18 NOTE — Telephone Encounter (Signed)
noted 

## 2010-12-07 ENCOUNTER — Other Ambulatory Visit: Payer: Self-pay | Admitting: *Deleted

## 2010-12-07 MED ORDER — BISOPROLOL FUMARATE 5 MG PO TABS: 5.0000 mg | ORAL_TABLET | Freq: Every day | ORAL | Status: DC

## 2010-12-21 ENCOUNTER — Encounter: Payer: Self-pay | Admitting: Emergency Medicine

## 2010-12-21 ENCOUNTER — Encounter: Payer: Self-pay | Admitting: Family Medicine

## 2010-12-21 ENCOUNTER — Ambulatory Visit (INDEPENDENT_AMBULATORY_CARE_PROVIDER_SITE_OTHER): Payer: Medicare Other | Admitting: Family Medicine

## 2010-12-21 VITALS — BP 120/72 | HR 89 | Temp 97.7°F | Ht 67.5 in | Wt 192.4 lb

## 2010-12-21 DIAGNOSIS — M5416 Radiculopathy, lumbar region: Secondary | ICD-10-CM

## 2010-12-21 DIAGNOSIS — IMO0002 Reserved for concepts with insufficient information to code with codable children: Secondary | ICD-10-CM

## 2010-12-21 DIAGNOSIS — L255 Unspecified contact dermatitis due to plants, except food: Secondary | ICD-10-CM

## 2010-12-21 DIAGNOSIS — I25810 Atherosclerosis of coronary artery bypass graft(s) without angina pectoris: Secondary | ICD-10-CM

## 2010-12-21 DIAGNOSIS — L237 Allergic contact dermatitis due to plants, except food: Secondary | ICD-10-CM

## 2010-12-21 MED ORDER — NEBIVOLOL HCL 2.5 MG PO TABS: 2.5000 mg | ORAL_TABLET | Freq: Every day | ORAL | Status: DC

## 2010-12-21 MED ORDER — CLOBETASOL PROPIONATE 0.05 % EX OINT: TOPICAL_OINTMENT | CUTANEOUS | Status: DC

## 2010-12-21 NOTE — Progress Notes (Addendum)
  Subjective:    Patient ID: Timothy Cordova, male    DOB: 09-23-1928, 77 y.o.   MRN: [id number]  HPI  [name], a 77 y.o. male presents today in the office for the following:    Dermatitis: Patient complains of a rash. Symptoms began 10 days ago. Patient describes the rash as red. Characteristics of rash and associated history: Is rash pruritic?  yes, Skin exposure to potential irritants at home or work or from hobbies?yes. Patient's previous dermatologic history includes neg. Family history of derm problems: n/a. Medications currently using: otc itch medication. Environmental exposures or allergies: none Known poison ivy exposure  CAD , s/p CABG. Pt self d/c bisoprolol. ? If making him feel bad and if making COPD worse.   Review of Systems As above No recent significant SOB No signficant edema    Objective:   Physical Exam   Physical Exam  Blood pressure 120/72, pulse 89, temperature 97.7 F (36.5 C), temperature source Oral, height 5' 7.5" (1.715 m), weight 192 lb 6.4 oz (87.272 kg), SpO2 98.00%.  GEN: WDWN, NAD, Non-toxic, A & O x 3 HEENT: Atraumatic, Normocephalic. Neck supple. No masses, No LAD. Ears and Nose: No external deformity. PULM: CTA B, no wheezes, crackles, rhonchi. No retractions. No resp. distress. No accessory muscle use. ABD: S, NT, ND, +BS. No rebound tenderness. No HSM.  EXTR: No c/c/e Skin: R arm with diffuse vesicles, extensive excoriation  NEURO Normal gait.  PSYCH: Normally interactive. Conversant. Not depressed or anxious appearing.  Calm demeanor.        Assessment & Plan:   1. Poison ivy   2. CAD, AUTOLOGOUS BYPASS GRAFT     Clobetasol topical  CAD, will need to be on b-blocker. D/c bisoprolol and trial of low dose Bystolic, which should have minimal COPD effect  Low back pain with radiculopathy, R: severe R back pain and lumbar radiculopathy radiating down leg. Failure to improve despite multiple years of conservative care,  steroids, epidural steroid injections, rehab. Obtain an MRI of the lumbar spine without contrast to evaluate for potential cord involvement or foraminal encroachment prior to neurosurgical consultation.

## 2010-12-22 ENCOUNTER — Ambulatory Visit (INDEPENDENT_AMBULATORY_CARE_PROVIDER_SITE_OTHER): Payer: Medicare Other | Admitting: Emergency Medicine

## 2010-12-22 ENCOUNTER — Encounter: Payer: Self-pay | Admitting: Emergency Medicine

## 2010-12-22 DIAGNOSIS — J449 Chronic obstructive pulmonary disease, unspecified: Secondary | ICD-10-CM

## 2010-12-22 NOTE — Progress Notes (Signed)
[name] is a 77 year old man with a history of moderate COPD, allergic rhinitis, coronary artery disease (patent grafts on last cath, [name])   [name] 04/19/09 -- returns for regular f/u. Now on cozaar and tolerating well. Breathing well, no flares or hospitalizations. Remains active, does get some sob with prolonged activity. Doing stationary bike. Hasn't restarted tobacco.   ROV 12/06/09 -- follows up for acute-on-chronic dyspnea in setting COPD, CAD. He saw [name] in 04/2009 for less severe symptoms, vein grafts were patent and PAOP was 18. He now is having SOB with simply walking to mailbox. Was previously doing exercise bike. He has had improvement in breathing in past with lasix (he uses as needed for LE edema). Remains on Spiriva and Symbicort + Proventil. No wheeze, no cough, no mucous, no CP. He has had panting and pre-syncope.   ROV 12/10/09 -- returns for f/u of his progressive SOB. His cards eval has been reassuring. CXR shows emphysema, some kyphosis, no infiltrates or pulm edema. Of note, he has gained 30 lbs since beginning of the yr![name]   ROV 12/22/10 -- COPD, Hx CAD, exertional dyspnea. Returns for annual f/u. He has been doing fairly well, no exacerbations since last visit. Still with some exertional SOB. He has been having problems with voiding at night, made better by stopping his night dose of Symbicort. He uses albuterol rarely, about 1x a week.   Gen: Pleasant, well-nourished, in no distress,  normal affect  ENT: No lesions,  mouth clear,  oropharynx clear, no postnasal drip  Neck: No JVD, no TMG, no carotid bruits  Lungs: Distant, no wheezing  Cardiovascular: RRR, heart sounds normal, no murmur or gallops, no peripheral edema  Musculoskeletal: No deformities, no cyanosis or clubbing  Neuro: alert, non focal  Skin: Red rash on RUE (poison ivy)  COPD No exacerbations since last visit. He isn't sure that the Symbicort is helping his breathing. Will stop symbicort  altogether to see if he misses and see if urinary retention resolves. (Spiriva would be more likely to cause these sx than symbicort). He needs  SABA sample, only one we have is Xopenex. I will give him this with understanding that he would do fine on normal albuterol. ROV 1 yr or prn

## 2010-12-22 NOTE — Patient Instructions (Signed)
Continue Spiriva daily Stop Symbicort altogether. Call our office if you feel you miss this medication Use your rescue inhaler 2 puffs as needed (sample of Xopenex given today; you would do fine with regular albuterol) Follow with Dr Delton Coombes in 1 year or sooner if you have any problems.

## 2010-12-22 NOTE — Assessment & Plan Note (Signed)
No exacerbations since last visit. He isn't sure that the Symbicort is helping his breathing. Will stop symbicort altogether to see if he misses and see if urinary retention resolves. (Spiriva would be more likely to cause these sx than symbicort). He needs  SABA sample, only one we have is Xopenex. I will give him this with understanding that he would do fine on normal albuterol. ROV 1 yr or prn

## 2011-01-24 ENCOUNTER — Other Ambulatory Visit: Payer: Self-pay | Admitting: *Deleted

## 2011-01-24 MED ORDER — LOSARTAN POTASSIUM 50 MG PO TABS: 25.0000 mg | ORAL_TABLET | Freq: Every day | ORAL | Status: DC

## 2011-01-30 ENCOUNTER — Other Ambulatory Visit: Payer: Self-pay | Admitting: *Deleted

## 2011-01-30 MED ORDER — METOPROLOL TARTRATE 25 MG PO TABS: 25.0000 mg | ORAL_TABLET | Freq: Two times a day (BID) | ORAL | Status: DC

## 2011-02-08 ENCOUNTER — Telehealth: Payer: Self-pay | Admitting: Emergency Medicine

## 2011-02-08 NOTE — Telephone Encounter (Signed)
Ok for him to stop the Spiriva to see if he misses it - have him call us in a couple weeks to report how he is doing off of it. If he declines off of it then we will look for a substitute. He has had trouble tolerating Symbicort in the past as well. OK to call in an albuterol HFA, but will still likely be expensive.

## 2011-02-08 NOTE — Telephone Encounter (Signed)
I spoke with pt and he states he needs a low cost albuterol inhaler. Pt states Dr. Delton Coombes gave him a xopenex inhaler at last ov but he states this is to expensive. Pt also states he stopped taking his spiriva bc it caused him to have low bladder pressure. Pt states his urologists advised him the spiriva is what caused that for him. Please advise Dr. Delton Coombes, thanks  Allergies  Allergen Reactions  . Ace Inhibitors     REACTION: cough  . Atorvastatin     REACTION: weakness  . Codeine   . Morphine Sulfate     REACTION: unspecified  . Ticlopidine Hcl     REACTION: unspecified  . Warfarin Sodium      Carver Fila, CMA

## 2011-02-09 MED ORDER — ALBUTEROL SULFATE HFA 108 (90 BASE) MCG/ACT IN AERS: 2.0000 | INHALATION_SPRAY | Freq: Four times a day (QID) | RESPIRATORY_TRACT | Status: DC | PRN

## 2011-02-09 NOTE — Telephone Encounter (Signed)
The patient says that the information relayed to RB was incorrect. He has stopped Symbicort but has continued on the Spiriva. His trouble with urination has improved since stopping the Symbicort. He is aware we will send a new rx for Proair HFA to [facility]. I will forward this msg to RB so he is aware pt stopped Symbicort, not Spiriva.

## 2011-02-09 NOTE — Telephone Encounter (Signed)
Continue the Spiriva and Stop Symbicort.

## 2011-02-10 NOTE — Telephone Encounter (Signed)
Spoke with pt and notified of recs per RB. Pt verbalized understanding.

## 2011-02-20 ENCOUNTER — Other Ambulatory Visit: Payer: Self-pay | Admitting: *Deleted

## 2011-02-20 NOTE — Telephone Encounter (Signed)
30 min medicare CPX in the next few months

## 2011-02-20 NOTE — Telephone Encounter (Signed)
Received faxed refill request from pharmacy. Form shows Glipizide 10mg , take one by mouth daily. This does not match the med sheet which shows the 24 hr tab. Called and spoke to Goodwin and she pulled the hard copy and realized that this was a mistake on their part and that she will make sure that patient gets the correct medication when this refill comes thru. Is it okay to refill medication and advise when patient is due to be seen?

## 2011-02-20 NOTE — Telephone Encounter (Signed)
Please advise when patient needs to be seen for follow-up or physical?

## 2011-02-20 NOTE — Telephone Encounter (Signed)
Yes. And thank-you - great pick-up.

## 2011-02-21 NOTE — Telephone Encounter (Signed)
Help make sure this is done and complete encounter please

## 2011-02-21 NOTE — Telephone Encounter (Signed)
Tried to reach patient but, only number we have is cell and it is cut off

## 2011-02-22 ENCOUNTER — Encounter: Payer: Self-pay | Admitting: *Deleted

## 2011-02-22 MED ORDER — GLIPIZIDE ER 10 MG PO TB24: 10.0000 mg | ORAL_TABLET | Freq: Every day | ORAL | Status: DC

## 2011-02-22 MED ORDER — METFORMIN HCL 1000 MG PO TABS: 1000.0000 mg | ORAL_TABLET | Freq: Two times a day (BID) | ORAL | Status: DC

## 2011-02-22 NOTE — Telephone Encounter (Signed)
Mailed letter to patient asking him to call our office and schedule appt

## 2011-02-28 ENCOUNTER — Telehealth: Payer: Self-pay

## 2011-02-28 MED ORDER — FUROSEMIDE 40 MG PO TABS: 40.0000 mg | ORAL_TABLET | Freq: Every day | ORAL | Status: DC

## 2011-02-28 NOTE — Telephone Encounter (Signed)
Needs a refill sent for furosemide 40 mg take one tablet daily.

## 2011-03-03 ENCOUNTER — Telehealth: Payer: Self-pay | Admitting: *Deleted

## 2011-03-03 NOTE — Telephone Encounter (Signed)
Patient calling says he is switching from an orthopaedic doctor to neurosurgeon b/c of reptured disc . He is asking for hydrocodone to be called in b/c he is in pain and their office is not open. He says if you cant do that can you call something in for pain    Patient uses [facility]

## 2011-03-03 NOTE — Telephone Encounter (Signed)
RX CALLED TO PHARMACY AND PATIENT ADVISED

## 2011-03-03 NOTE — Telephone Encounter (Signed)
i know him well, and i am ok with that for now  vicodin 5-500, 1 po q 4-6 hours prn pain, #40, 0 refills  Please have them ask the neurosurgeon to send me his consult note.

## 2011-03-07 ENCOUNTER — Other Ambulatory Visit: Payer: Self-pay | Admitting: *Deleted

## 2011-03-07 MED ORDER — SIMVASTATIN 40 MG PO TABS: 40.0000 mg | ORAL_TABLET | Freq: Every day | ORAL | Status: DC

## 2011-03-15 ENCOUNTER — Other Ambulatory Visit (INDEPENDENT_AMBULATORY_CARE_PROVIDER_SITE_OTHER): Payer: Medicare Other

## 2011-03-15 ENCOUNTER — Other Ambulatory Visit: Payer: Self-pay | Admitting: *Deleted

## 2011-03-15 ENCOUNTER — Other Ambulatory Visit: Payer: Self-pay | Admitting: Family Medicine

## 2011-03-15 DIAGNOSIS — I509 Heart failure, unspecified: Secondary | ICD-10-CM

## 2011-03-15 DIAGNOSIS — E785 Hyperlipidemia, unspecified: Secondary | ICD-10-CM

## 2011-03-15 DIAGNOSIS — I25810 Atherosclerosis of coronary artery bypass graft(s) without angina pectoris: Secondary | ICD-10-CM

## 2011-03-15 DIAGNOSIS — I5032 Chronic diastolic (congestive) heart failure: Secondary | ICD-10-CM

## 2011-03-15 DIAGNOSIS — Z79899 Other long term (current) drug therapy: Secondary | ICD-10-CM

## 2011-03-15 LAB — LIPID PANEL
HDL: 36.7 mg/dL — ABNORMAL LOW (ref >39.00)
LDL Cholesterol: 42 mg/dL (ref 0–99)
Total CHOL/HDL Ratio: 3
VLDL: 28.6 mg/dL (ref 0.0–40.0)

## 2011-03-15 LAB — HEPATIC FUNCTION PANEL
ALT: 20 U/L (ref 0–53)
Total Bilirubin: 0.3 mg/dL (ref 0.3–1.2)

## 2011-03-15 LAB — BASIC METABOLIC PANEL
BUN: 44 mg/dL — ABNORMAL HIGH (ref 6–23)
CO2: 28 mEq/L (ref 19–32)
Calcium: 9.4 mg/dL (ref 8.4–10.5)
Chloride: 103 mEq/L (ref 96–112)
Creatinine, Ser: 1.7 mg/dL — ABNORMAL HIGH (ref 0.4–1.5)
GFR: 40.51 mL/min — ABNORMAL LOW (ref >60.00)
Glucose, Bld: 72 mg/dL (ref 70–99)
Potassium: 4.6 mEq/L (ref 3.5–5.1)
Sodium: 139 mEq/L (ref 135–145)

## 2011-03-15 MED ORDER — DOXAZOSIN MESYLATE 4 MG PO TABS: 4.0000 mg | ORAL_TABLET | Freq: Every day | ORAL | Status: DC

## 2011-03-15 MED ORDER — HYDROCODONE-ACETAMINOPHEN 5-500 MG PO TABS: 1.0000 | ORAL_TABLET | Freq: Four times a day (QID) | ORAL | Status: AC | PRN

## 2011-03-15 NOTE — Progress Notes (Signed)
Addended by: Alvina Chou on: 03/15/2011 10:52 AM   Modules accepted: Orders

## 2011-03-15 NOTE — Progress Notes (Signed)
Addended by: Hannah Beat on: 03/15/2011 03:20 PM   Modules accepted: Orders

## 2011-03-21 ENCOUNTER — Encounter: Payer: Self-pay | Admitting: Family Medicine

## 2011-03-21 ENCOUNTER — Ambulatory Visit (INDEPENDENT_AMBULATORY_CARE_PROVIDER_SITE_OTHER): Payer: Medicare Other | Admitting: Family Medicine

## 2011-03-21 DIAGNOSIS — IMO0002 Reserved for concepts with insufficient information to code with codable children: Secondary | ICD-10-CM

## 2011-03-21 DIAGNOSIS — I25810 Atherosclerosis of coronary artery bypass graft(s) without angina pectoris: Secondary | ICD-10-CM

## 2011-03-21 DIAGNOSIS — J4489 Other specified chronic obstructive pulmonary disease: Secondary | ICD-10-CM

## 2011-03-21 DIAGNOSIS — M5416 Radiculopathy, lumbar region: Secondary | ICD-10-CM

## 2011-03-21 DIAGNOSIS — N189 Chronic kidney disease, unspecified: Secondary | ICD-10-CM

## 2011-03-21 DIAGNOSIS — E782 Mixed hyperlipidemia: Secondary | ICD-10-CM

## 2011-03-21 DIAGNOSIS — I10 Essential (primary) hypertension: Secondary | ICD-10-CM

## 2011-03-21 DIAGNOSIS — I509 Heart failure, unspecified: Secondary | ICD-10-CM

## 2011-03-21 DIAGNOSIS — Z23 Encounter for immunization: Secondary | ICD-10-CM

## 2011-03-21 DIAGNOSIS — J449 Chronic obstructive pulmonary disease, unspecified: Secondary | ICD-10-CM

## 2011-03-21 DIAGNOSIS — I5032 Chronic diastolic (congestive) heart failure: Secondary | ICD-10-CM

## 2011-03-21 MED ORDER — TRAMADOL HCL 50 MG PO TABS: 50.0000 mg | ORAL_TABLET | Freq: Four times a day (QID) | ORAL | Status: AC | PRN

## 2011-03-21 NOTE — Progress Notes (Signed)
Patient Name: Timothy Cordova Date of Birth: 1928/07/28 Age: 77 y.o. Medical Record Number: [id number] Gender: male  History of Present Illness:  Timothy Cordova is a 77 y.o. very pleasant male patient who presents with the following:  Right-sided lumbar radiculopathy. The patient has known disc pathology, and he is seen at [facility] orthopedics in the past. He previously had an MRI done in their office. Several years ago, about four years ago, fell and hurt his back. Had some bulging disks, and he has been seeing [facility], and has had some ESI. Pain is much more severe than it. He has constant right-sided radiculopathy down his right leg and pain in his buttocks. He is requiring narcotics at this time. He has had multiple years of this history. Epidural steroid injections have been tried with decreasing efficacy.  No change --- wife can hear wheezing more per report. Patient does not think that new. The patient recently stopped his Spiriva, and he and his wife have some differences of opinion about whether or not his breathing has been altered. His wife believes that he is wheezing more, but he denies this subjectively.  COPD as above. He does have some shortness of breath with relatively mild exertion.  No chest pain. Minimal lower extremity edema. History is significant for coronary artery bypass grafting.  Chronic kidney disease: Creatinine is slightly up from most recent, with a mildly impaired GFR.  Comprehensive Metabolic Panel:    Component Value Date/Time   NA 139 03/15/2011 1051   K 4.6 03/15/2011 1051   CL 103 03/15/2011 1051   CO2 28 03/15/2011 1051   BUN 44* 03/15/2011 1051   CREATININE 1.7* 03/15/2011 1051   GLUCOSE 72 03/15/2011 1051   CALCIUM 9.4 03/15/2011 1051   AST 18 03/15/2011 1051   ALT 20 03/15/2011 1051   ALKPHOS 51 03/15/2011 1051   BILITOT 0.3 03/15/2011 1051   PROT 6.9 03/15/2011 1051   ALBUMIN 3.8 03/15/2011 1051   Lipids: Doing well, stable. Tolerating  meds fine with no SE. Panel reviewed with patient.  Lipids:    Component Value Date/Time   CHOL 107 03/15/2011 1051   TRIG 143.0 03/15/2011 1051   HDL 36.70* 03/15/2011 1051   VLDL 28.6 03/15/2011 1051   CHOLHDL 3 03/15/2011 1051    Lab Results  Component Value Date   ALT 20 03/15/2011   AST 18 03/15/2011   ALKPHOS 51 03/15/2011   BILITOT 0.3 03/15/2011     Past Medical History, Surgical History, Social History, Family History, and Problem List have been reviewed in EHR and updated if relevant.  Review of Systems: As above. Significant back pain. Radiculopathy. Shortness of breath with exercise. No chest pain. No nausea or vomiting. Otherwise, the pertinent positives and negatives are listed above and in the HPI, otherwise a full review of systems has been reviewed and is negative unless noted positive.   Physical Examination: Filed Vitals:   03/21/11 0802  BP: 110/64  Pulse: 67  Temp: 97.6 F (36.4 C)  TempSrc: Oral  Height: 5' 7.5" (1.715 m)  Weight: 190 lb 6.4 oz (86.365 kg)  SpO2: 97%    Body mass index is 29.38 kg/(m^2).   GEN: WDWN, NAD, Non-toxic, A & O x 3 HEENT: Atraumatic, Normocephalic. Neck supple. No masses, No LAD. Ears and Nose: No external deformity. CV: RRR, No M/G/R. No JVD. No thrill. No extra heart sounds. PULM: CTA B, no wheezes, crackles, rhonchi. No retractions. No resp. distress. No accessory  muscle use. EXTR: No c/c/e NEURO Normal gait.  PSYCH: Normally interactive. Conversant. Not depressed or anxious appearing.  Calm demeanor.  MSK: No appreciated strength deficit in the lower extremities. Full range of motion and no pain at the hips. Deep tendon reflexes are 2+ and symmetric. No clonus. Sensation is intact. Seated straight leg raise is positive on the right. Negative on the left.  Assessment and Plan:  1. CKD (chronic kidney disease)    2. COPD    3. CAD, AUTOLOGOUS BYPASS GRAFT    4. HYPERTENSION    5. Mixed hyperlipidemia    6. Lumbar  radiculopathy  Ambulatory referral to Neurosurgery, traMADol (ULTRAM) 50 MG tablet  7. Flu vaccine need  Flu vaccine greater than or equal to 3yo preservative free IM  8. DIASTOLIC HEART FAILURE, CHRONIC     Obtain an MRI of the lumbar spine without contrast, open MRI, to evaluate for foraminal impingement, spinal cord impingement, spinal stenosis. The patient has recalcitrant pain, decreased functionality, and he has already discussed this case per his report with Dr. Channing Mutters from neurosurgery, and would like to get his input.  Consult neurosurgery. Prior to any intervention, cardiac clearance and pulmonary clearance would need to be obtained given the significant history of coronary artery disease, status post bypass grafting, and COPD.  Blood pressure and lipids are stable.  Orders Placed This Encounter  Procedures  . Flu vaccine greater than or equal to 3yo preservative free IM  . Ambulatory referral to Neurosurgery    Referral Priority:  Routine    Referral Type:  Surgical    Referral Reason:  Specialty Services Required    Requested Specialty:  Neurosurgery    Number of Visits Requested:  1    Medications Discontinued During This Encounter  Medication Reason  . metoprolol tartrate (LOPRESSOR) 25 MG tablet   . budesonide-formoterol (SYMBICORT) 160-4.5 MCG/ACT inhaler

## 2011-03-21 NOTE — Patient Instructions (Signed)
REFERRAL: GO THE THE FRONT ROOM AT THE ENTRANCE OF OUR CLINIC, NEAR CHECK IN. ASK FOR MARION. SHE WILL HELP YOU SET UP YOUR REFERRAL. DATE: TIME:   F/u 6 months 

## 2011-03-23 ENCOUNTER — Ambulatory Visit
Admission: RE | Admit: 2011-03-23 | Discharge: 2011-03-23 | Disposition: A | Discharge status: Home / Self Care | Payer: Medicare Other | Source: Ambulatory Visit | Attending: Family Medicine | Admitting: Family Medicine

## 2011-03-23 ENCOUNTER — Other Ambulatory Visit: Payer: Medicare Other

## 2011-03-23 DIAGNOSIS — M5416 Radiculopathy, lumbar region: Secondary | ICD-10-CM

## 2011-03-25 ENCOUNTER — Other Ambulatory Visit: Payer: Medicare Other

## 2011-03-27 ENCOUNTER — Encounter: Payer: Self-pay | Admitting: Family Medicine

## 2011-03-27 ENCOUNTER — Telehealth: Payer: Self-pay | Admitting: *Deleted

## 2011-03-27 ENCOUNTER — Other Ambulatory Visit: Payer: Self-pay | Admitting: Family Medicine

## 2011-03-27 DIAGNOSIS — N289 Disorder of kidney and ureter, unspecified: Secondary | ICD-10-CM | POA: Insufficient documentation

## 2011-03-27 DIAGNOSIS — N189 Chronic kidney disease, unspecified: Secondary | ICD-10-CM

## 2011-03-27 NOTE — Telephone Encounter (Signed)
i just spoke to him about it.

## 2011-03-27 NOTE — Telephone Encounter (Signed)
Spouse requesting results of MRI.

## 2011-03-27 NOTE — Telephone Encounter (Signed)
Patient says that he has already spoken to you

## 2011-04-14 ENCOUNTER — Telehealth: Payer: Self-pay | Admitting: Family Medicine

## 2011-04-14 NOTE — Telephone Encounter (Signed)
Wife calling stating that pt has been falling for the last couple of days and want to know what's going on with him, per wife pt was seen by Dr.Roy neurosurgeon for spinal and nerve problems, pt has a MRI which was discussed with Dr. Channing Mutters. Per wife everything was normal except from the waist down, he's having problems with his nerves. I advised wife to call Dr.Roy while I ask another physician here for advice and she stated she did and he told her to take pt to the ER, per wife she doesn't want to go to the ER because she doesn't know those Dr's. Wife also states that Dr.Roy changed some medications, gave pt gabapentin 300 mg tid and hydromorphone 2ml 1tab q6h, she's asking if that would have anything to do with him falling? I stated that she would probably need to see Dr.Copland and that I would ask another physician if they could add him on.

## 2011-04-14 NOTE — Telephone Encounter (Signed)
Spoke with Dewayne Hatch, patients spouse via telephone and scheduled [facility] to see Dr. Ermalene Searing on 04/18/2011 at 11:45.

## 2011-04-14 NOTE — Telephone Encounter (Signed)
Attempting call now

## 2011-04-14 NOTE — Telephone Encounter (Signed)
Dee: Can you help set up an early follow-up Monday or Tuesday for this patient? I am gone all next week, so ideally with Dr. B or other MD  I spoke to both the patient and his wife at length. He was taking tramadol up until Wednesday evening. At the same time he started taking hydromorphone 2 mg. Additionally, he started taking gabapentin 300 mg 3 times daily. He took one gabapentin on Wednesday and then t.i.d. Dosing starting on Thursday.  His cognitive completely clear and normal per his wife. He had 2 episodes where he gave out and felt as if his legs gave out from underneath him, and he had some extra movements when he was taking the tramadol notably at night. They also noticed a tremor this morning.  Given potential risks vs benefits, and the patient's multiple comorbidities and risk of influenza, I think it is okay for him to have early outpatient followup next week. Pt and wife agree.  Discontinue tramadol. I am placing this on his allergy list. It can cause some degree of seizure activity, and he is elderly with decreased GFR. He may have been on a higher than desired gabapentin initial titration, and I am decreasing this to BID dosing for 5 days, then go up to TID dosing.  Use a walker for now. Strict precautions, and if he worsens in any way go to the emergency room for evaluation including any change in neurological signs. Per the patient and his wife, he has actually had a very good day, and has been up and fully functional and even assisted in cleaning the house.

## 2011-04-18 ENCOUNTER — Ambulatory Visit (INDEPENDENT_AMBULATORY_CARE_PROVIDER_SITE_OTHER): Payer: Medicare Other | Admitting: Family Medicine

## 2011-04-18 ENCOUNTER — Encounter: Payer: Self-pay | Admitting: Family Medicine

## 2011-04-18 DIAGNOSIS — W19XXXA Unspecified fall, initial encounter: Secondary | ICD-10-CM

## 2011-04-18 DIAGNOSIS — M545 Low back pain, unspecified: Secondary | ICD-10-CM

## 2011-04-18 DIAGNOSIS — G8929 Other chronic pain: Secondary | ICD-10-CM | POA: Insufficient documentation

## 2011-04-18 MED ORDER — HYDROMORPHONE HCL 2 MG PO TABS: 2.0000 mg | ORAL_TABLET | Freq: Four times a day (QID) | ORAL | Status: DC | PRN

## 2011-04-18 MED ORDER — MELOXICAM 15 MG PO TABS: 15.0000 mg | ORAL_TABLET | Freq: Every day | ORAL | Status: DC

## 2011-04-18 NOTE — Assessment & Plan Note (Signed)
Acute ? Small annular tear and post. ligament injury  ontop of chronic  Sciatic nerve injury from distant fall. Appears per Dr. Channing Mutters no surgical indications. Strongly recommend referral to PT for treatment eval.. Sounds like Dr. Channing Mutters may be setting this up, but pt to call if he does not hear from them. Will add trial of meloxicam as well (temporary given CAD issues) for inflammation to try to reduce frequency of hydromorphone use. Pt to call to set follow up with Dr. Patsy Lager if pain not improving.

## 2011-04-18 NOTE — Progress Notes (Signed)
Subjective:    Patient ID: Timothy Cordova, male    DOB: January 05, 1929, 78 y.o.   MRN: [id number]  HPI  78 year old male pt of Dr. Brayton Layman with complicated medical history presents after recent increase in falling. Started after pain med change to hydromorphone ? Dose (2 mg daily every 4 hours) and decrease gabapentin for chronic back pain   The pt was referred to Dr. Channing Mutters for fairly severe and constant right buttock pain radiating to right foot. MRI was reviewed. At consult with Dr. Channing Mutters per note reviewed in detail: Dr. Channing Mutters felt MRI demonstrated small annual tear at L5-S1 and some involvement of posterior longitudinal ligament. [place] felt falling on sciatic nerve several years ago may have injured it.  He saw no indication for surgical intervention. He decrease gabapentin and , started hydromorphone as pt ha SE to vicodin and tramadol in past.  Pt and wifecalled last week, spoke to Dr. Salena Saner as below: Had episode where legs weak, and fell to floor. No LOC. Occurred after first dose of hydromorphone. No proceeding symptoms, no CP, no SOB. His cognitive completely clear and normal per his wife.   He had 2 previous  episodes where he gave out and felt as if his legs gave out from underneath him, and he had some extra movements when he was taking the tramadol notably at night.   TODAY: Since then he has not had any more falls, no further issues with hydromorphone at same. Feeling well overall other than pain issues which remain severe for him. Needs refill of hydromorphone  Taking every 4 hours EVEN THOUGH IT WAS WRITTEN FOR EVERY  6 HOURS as needed) . Dr. Channing Mutters gave him 20 pills.. Almost up. He is continuing to have severe pain in right buttock, occ radiates to right foot. Pt's wife does say when he takes the hydromoprphone, it causes him to sleep.  Per Dr. Renaye Rakers last note: The patient has known disc pathology, and he is seen at [facility] orthopedics in the past. He previously had an MRI done in  their office.  Several years ago, about four years ago, fell and hurt his back. Had some bulging disks, and he has been seeing [facility], and has had some ESI. Pain is much more severe than it. He has constant right-sided radiculopathy down his right leg and pain in his buttocks. He is requiring narcotics at this time. He has had multiple years of this history. Epidural steroid injections have been tried with decreasing efficacy.   Review of Systems  Constitutional: Negative for fever and fatigue.  HENT: Negative for ear pain.   Eyes: Negative for pain.  Respiratory: Negative for cough, shortness of breath and wheezing.   Cardiovascular: Negative for chest pain and palpitations.  Gastrointestinal: Negative for abdominal pain and blood in stool.       Objective:   Physical Exam  Constitutional: He is oriented to person, place, and time. Vital signs are normal. He appears well-developed and well-nourished.  HENT:  Head: Normocephalic.  Right Ear: Hearing normal.  Left Ear: Hearing normal.  Nose: Nose normal.  Mouth/Throat: Oropharynx is clear and moist and mucous membranes are normal.  Neck: Trachea normal. Carotid bruit is not present. No mass and no thyromegaly present.  Cardiovascular: Normal rate, regular rhythm and normal pulses.  Exam reveals no gallop, no distant heart sounds and no friction rub.   No murmur heard.      No peripheral edema  Pulmonary/Chest: Effort normal  and breath sounds normal. No respiratory distress.  Musculoskeletal:       Right hip: He exhibits decreased range of motion.       Left hip: He exhibits decreased range of motion.       Lumbar back: He exhibits decreased range of motion and tenderness. He exhibits no bony tenderness, no swelling and no deformity.       ttp in right sciatic notch, positive faber's on right and decreased  ROM of B hips Neg SLR. No focal vertebral pain  Neurological: He is alert and oriented to person, place, and time. He has  normal reflexes. He displays normal reflexes. No cranial nerve deficit. He exhibits normal muscle tone. Coordination normal.  Skin: Skin is warm, dry and intact. No rash noted.  Psychiatric: He has a normal mood and affect. His speech is normal and behavior is normal. Thought content normal.          Assessment & Plan:

## 2011-04-18 NOTE — Patient Instructions (Signed)
Start meloxicam for pain . Try to limit hydromorphone. We will call you with further recommendations in next few days.

## 2011-04-18 NOTE — Assessment & Plan Note (Signed)
No proceeding symptoms, no cadicac symptoms, no LOC, no change in mental status.. Just balance off after first dose of hydromorphone. No further issues. I feel this was medicaiton induced. I am concerned because this elderly male is take hydromorphone and is taking now more frequently than recommended. Counsled pt to use only prn and to limit to severe pain.

## 2011-05-17 ENCOUNTER — Ambulatory Visit (INDEPENDENT_AMBULATORY_CARE_PROVIDER_SITE_OTHER): Payer: Medicare Other | Admitting: Family Medicine

## 2011-05-17 ENCOUNTER — Encounter: Payer: Self-pay | Admitting: Family Medicine

## 2011-05-17 VITALS — BP 102/64 | HR 75 | Temp 97.5°F | Ht 67.5 in | Wt 186.0 lb

## 2011-05-17 DIAGNOSIS — G8929 Other chronic pain: Secondary | ICD-10-CM

## 2011-05-17 DIAGNOSIS — M545 Low back pain: Secondary | ICD-10-CM

## 2011-05-17 DIAGNOSIS — N289 Disorder of kidney and ureter, unspecified: Secondary | ICD-10-CM

## 2011-05-17 DIAGNOSIS — M549 Dorsalgia, unspecified: Secondary | ICD-10-CM

## 2011-05-17 MED ORDER — FENTANYL 12 MCG/HR TD PT72: 1.0000 | MEDICATED_PATCH | TRANSDERMAL | Status: DC

## 2011-05-17 MED ORDER — HYDROCODONE-ACETAMINOPHEN 5-500 MG PO TABS: 1.0000 | ORAL_TABLET | Freq: Four times a day (QID) | ORAL | Status: AC | PRN

## 2011-05-19 NOTE — Progress Notes (Signed)
Patient Name: Timothy Cordova Date of Birth: 31-May-1928 Medical Record Number: [id number] Gender: male Date of Encounter: 05/17/2011  History of Present Illness:  THUNDER BRIDGEWATER is a 78 y.o. very pleasant male patient who presents with the following:  Pleasant patient who I know well is here in followup regarding his severe low back pain. He recently saw Dr. Channing Mutters, the neurosurgeon. Dr. Channing Mutters felt as if the patient had no pathology that was significant from a surgical standpoint, and that he had nothing to offer him from an operative standpoint.  He has been taking some Dilauded, which did make him relatively drowsy. Today, they're here today borders discuss long-term management. In the past, he has done relatively well and has had multiple epidural steroid injections by Dr. Terese Door. It is no longer are affected.  Also, incidentally while he was obtaining an MRI, the patient did and was found to have a renal lesion. Since that time, he has seen neurology, and they are going to be following up with serial examinations and serial imaging.  MRI LUMBAR SPINE WITHOUT CONTRAST   Technique:  Multiplanar and multiecho pulse sequences of the lumbar spine were obtained without intravenous contrast.   Comparison: 04/09/2008   Findings: As on the prior exam, the lowest full intervertebral disk space is labeled L5-S1.  If procedural intervention is to be performed, careful correlation with this numbering strategy is recommended.   The conus medullaris appears unremarkable.  Conus level:  L1.   There are multiple bilateral renal cysts.  However, a left mid kidney lesion measuring 2.0 x 2.1 cm on image 12 of series 7 has high T1 signal characteristics and previously measured 1.4 x 1.2 cm.  Given the increase in size and complexity of this lesion, further workup is likely warranted to exclude a small renal cell carcinoma.   There is 3 mm of degenerative anterior subluxation of L5 on S1.  No pars  defects are observed. Inversion recovery weighted images demonstrate no significant abnormal vertebral or periligamentous edema.   Intervertebral disc desiccation is noted at L5-S1.   Additional findings at individual levels are as follows:   L1-2:  Unremarkable.   L2-3:  Mild facet arthropathy.  No impingement.   L3-4:  Mild facet arthropathy.  Mild diffuse disc bulge.  No significant impingement.   L4-5:  Bilateral facet arthropathy and diffuse disc bulge noted with borderline left foraminal stenosis and mild bilateral subarticular lateral recess stenosis.   L5-S1:  Left greater than right facet arthropathy noted with disc uncovering and mild disc bulge.  There is moderate left and mild right foraminal stenosis along with mild left subarticular lateral recess stenosis.   IMPRESSION:   1.  Lower lumbar spondylosis and degenerative disc disease with mild to moderate levels of impingement at L4-5 and L5-S1. 2.  A complex left mid kidney lesion has enlarged compared the prior exam from 2009.  Given its homogeneity this is probably simply an enlarging complex cyst, but due to the underlying complexity the possibility of a small renal mass cannot be excluded.  I note that Timothy Cordova has renal insufficiency with a GFR 40 on recent labs.  Options for imaging followup include sonographic assessment with Doppler characterization of this lesion, or renal protocol MRI with half dose of contrast medium.   Original Report Authenticated By: Dellia Cloud, M.D.   Patient Active Problem List  Diagnoses  . DIABETES MELLITUS, TYPE II  . MIXED HYPERLIPIDEMIA  . DEMENTIA  . HEARING  LOSS  . HYPERTENSION  . CAD, AUTOLOGOUS BYPASS GRAFT  . DIASTOLIC HEART FAILURE, CHRONIC  . ALLERGIC RHINITIS  . COPD  . BENIGN PROSTATIC HYPERTROPHY  . OSTEOARTHRITIS  . DIZZINESS  . FATIGUE  . EDEMA- LOCALIZED  . TOBACCO ABUSE, HX OF  . CKD (chronic kidney disease)  . Renal lesion  .  Chronic low back pain   Past Medical History  Diagnosis Date  . Coronary artery disease     s/p CABG 2006. LHC (1/10): SVG-OM and PLOM, SVG-D, and LIMA-LAD were all patent  . Hypertension   . COPD (chronic obstructive pulmonary disease)     moderate. followed by Dr.Byrum  . Chronic diastolic heart failure     8/08 ECHO with EF 55%  . Edema     localized. suspect diastolic CHF + venous insufficiency  . Mixed hyperlipidemia   . Sleep apnea     ?  . Diabetes mellitus   . History of tobacco abuse   . Allergic rhinitis   . Hypercholesterolemia   . Benign prostatic hypertrophy   . Osteoarthritis   . Dementia     (mild)--06/2006  . Hearing loss     left >>right  . CKD (chronic kidney disease)   . Echocardiogram abnormal 07/2006    EF 55%, mild LAE   Past Surgical History  Procedure Date  . Angioplasty [id number]  . Rotator cuff repair 2005    left, Gioffre  . Coronary artery bypass graft 02/2005   History  Substance Use Topics  . Smoking status: Former Smoker -- 3.0 packs/day for 60 years    Types: Cigarettes    Quit date: 07/12/2005  . Smokeless tobacco: Never Used  . Alcohol Use: No   Family History  Problem Relation Age of Onset  . Heart attack Mother   . Lung cancer Sister    Allergies  Allergen Reactions  . Tramadol Hcl     Tremor vs seizure activity  . Ace Inhibitors     REACTION: cough  . Atorvastatin     REACTION: weakness  . Codeine   . Morphine Sulfate     REACTION: unspecified  . Ticlopidine Hcl     REACTION: unspecified  . Warfarin Sodium     Medication list has been reviewed and updated.  Review of Systems: Significant pain. Feeling pretty well otherwise  Physical Examination: Filed Vitals:   05/17/11 1019  BP: 102/64  Pulse: 75  Temp: 97.5 F (36.4 C)  TempSrc: Oral  Height: 5' 7.5" (1.715 m)  Weight: 186 lb (84.369 kg)  SpO2: 98%    Body mass index is 28.70 kg/(m^2).   GEN: WDWN, NAD, Non-toxic, Alert & Oriented x 3 HEENT:  Atraumatic, Normocephalic.  Ears and Nose: No external deformity. EXTR: No clubbing/cyanosis/edema NEURO: Normal gait.  PSYCH: Normally interactive. Conversant. Not depressed or anxious appearing.  Calm demeanor.    Assessment and Plan: 1. Chronic back pain  fentaNYL (DURAGESIC - DOSED MCG/HR) 12 MCG/HR, HYDROcodone-acetaminophen (VICODIN) 5-500 MG per tablet  2. Chronic low back pain    3. Renal lesion      Reviewed our pain policy and pain contract.  Start the patient on low dose Duragesic patch, and then with some p.r.n. Vicodin on an as-needed basis. Followup in one month, and titrate up to affect her functionality.  Renal lesion: Discussed and reviewed findings, the patient will continue to have long-term followup with urology regarding this.

## 2011-05-30 ENCOUNTER — Other Ambulatory Visit: Payer: Self-pay | Admitting: *Deleted

## 2011-05-30 MED ORDER — GLIPIZIDE ER 10 MG PO TB24: 10.0000 mg | ORAL_TABLET | Freq: Every day | ORAL | Status: DC

## 2011-06-08 ENCOUNTER — Other Ambulatory Visit: Payer: Self-pay | Admitting: *Deleted

## 2011-06-08 MED ORDER — METFORMIN HCL 1000 MG PO TABS: 1000.0000 mg | ORAL_TABLET | Freq: Two times a day (BID) | ORAL | Status: DC

## 2011-06-15 ENCOUNTER — Encounter: Payer: Self-pay | Admitting: Family Medicine

## 2011-06-15 ENCOUNTER — Ambulatory Visit (INDEPENDENT_AMBULATORY_CARE_PROVIDER_SITE_OTHER): Payer: Medicare Other | Admitting: Family Medicine

## 2011-06-15 DIAGNOSIS — M545 Low back pain: Secondary | ICD-10-CM

## 2011-06-15 DIAGNOSIS — G8929 Other chronic pain: Secondary | ICD-10-CM

## 2011-06-15 DIAGNOSIS — B029 Zoster without complications: Secondary | ICD-10-CM

## 2011-06-15 DIAGNOSIS — M549 Dorsalgia, unspecified: Secondary | ICD-10-CM

## 2011-06-15 MED ORDER — VALACYCLOVIR HCL 1 G PO TABS: 1000.0000 mg | ORAL_TABLET | Freq: Three times a day (TID) | ORAL | Status: DC

## 2011-06-15 MED ORDER — FENTANYL 12 MCG/HR TD PT72: 1.0000 | MEDICATED_PATCH | TRANSDERMAL | Status: DC

## 2011-06-15 MED ORDER — OXYCODONE-ACETAMINOPHEN 5-325 MG PO TABS: 1.0000 | ORAL_TABLET | ORAL | Status: AC | PRN

## 2011-06-15 NOTE — Progress Notes (Signed)
  Patient Name: Timothy Cordova Date of Birth: June 09, 1928 Age: 78 y.o. Medical Record Number: [id number] Gender: male Date of Encounter: 06/15/2011  History of Present Illness:  Timothy Cordova is a 78 y.o. very pleasant male patient who presents with the following:  When taking hydrocodone -- when waking up and will have trouble seeing.  Back pain: He is doing much better and is much more stable for 48 hours while he is taking his low dose Fentanyl patch. In the third day towards the second half of the day, he will have some significant pain. At nighttime he is having trouble sleeping on the third day, wakeup and pain.  Rash and left-sided anterior thorax pain. This began 48 hours ago. He does have a vesicular purplish rash  Past Medical History, Surgical History, Social History, Family History, Problem List, Medications, and Allergies have been reviewed and updated if relevant.  Review of Systems: ROS: GEN: Acute illness details above GI: Tolerating PO intake GU: maintaining adequate hydration and urination Pulm: No SOB Interactive and getting along well at home.  Otherwise, ROS is as per the HPI.   Physical Examination: Filed Vitals:   06/15/11 1023  BP: 130/72  Pulse: 82  Temp: 97.9 F (36.6 C)  TempSrc: Oral  Height: 5' 7.5" (1.715 m)  Weight: 183 lb 12.8 oz (83.371 kg)  SpO2: 98%    Body mass index is 28.36 kg/(m^2).   GEN: WDWN, NAD, Non-toxic, A & O x 3 HEENT: Atraumatic, Normocephalic. Neck supple. No masses, No LAD. Ears and Nose: No external deformity. CV: RRR, No M/G/R. No JVD. No thrill. No extra heart sounds. SKIN: Left anterior thorax, 4-5 vesicles that are purplish in coloration EXTR: No c/c/e NEURO Normal gait.  PSYCH: Normally interactive. Conversant. Not depressed or anxious appearing.  Calm demeanor.    Assessment and Plan: 1. Chronic low back pain  oxyCODONE-acetaminophen (PERCOCET) 5-325 MG per tablet  2. Chronic back pain  fentaNYL  (DURAGESIC - DOSED MCG/HR) 12 MCG/HR, oxyCODONE-acetaminophen (PERCOCET) 5-325 MG per tablet  3. Shingles  valACYclovir (VALTREX) 1000 MG tablet    Classic shingles. Within window to use Valtrex.  Back pain. Improving. Change fentanyl dosing to every 48 hours. Change from hydrocodone to Percocet when necessary pain

## 2011-07-10 ENCOUNTER — Other Ambulatory Visit: Payer: Self-pay | Admitting: Family Medicine

## 2011-07-10 DIAGNOSIS — G8929 Other chronic pain: Secondary | ICD-10-CM

## 2011-07-10 MED ORDER — FENTANYL 12 MCG/HR TD PT72: 1.0000 | MEDICATED_PATCH | TRANSDERMAL | Status: DC

## 2011-08-14 ENCOUNTER — Other Ambulatory Visit: Payer: Self-pay | Admitting: Family Medicine

## 2011-08-14 ENCOUNTER — Other Ambulatory Visit: Payer: Self-pay | Admitting: *Deleted

## 2011-08-14 DIAGNOSIS — M549 Dorsalgia, unspecified: Secondary | ICD-10-CM

## 2011-08-14 DIAGNOSIS — G8929 Other chronic pain: Secondary | ICD-10-CM

## 2011-08-14 MED ORDER — FENTANYL 12 MCG/HR TD PT72: 1.0000 | MEDICATED_PATCH | TRANSDERMAL | Status: DC

## 2011-08-14 NOTE — Progress Notes (Signed)
Filled by hand.

## 2011-08-14 NOTE — Telephone Encounter (Signed)
Patient request refill on fentanyl patch. Patient will need this on Friday

## 2011-08-15 MED ORDER — FENTANYL 12 MCG/HR TD PT72: 1.0000 | MEDICATED_PATCH | TRANSDERMAL | Status: AC

## 2011-08-15 NOTE — Telephone Encounter (Signed)
[facility] WILL SHREAD THIS RX

## 2011-08-15 NOTE — Telephone Encounter (Signed)
Printed multiple times b/c printer not working. 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

## 2011-08-15 NOTE — Telephone Encounter (Signed)
I handwrote a presciption and gave to patient yesterday.

## 2011-09-11 ENCOUNTER — Other Ambulatory Visit: Payer: Self-pay

## 2011-09-11 MED ORDER — LEVALBUTEROL TARTRATE 45 MCG/ACT IN AERO: 1.0000 | INHALATION_SPRAY | RESPIRATORY_TRACT | Status: DC | PRN

## 2011-09-11 NOTE — Telephone Encounter (Signed)
[facility] faxed request xopenex HFA 45 mcg/inh #15. Last filled 04/07/11. Not on med list.Please advise.

## 2011-09-11 NOTE — Telephone Encounter (Signed)
Ok to refill #30, 2 refills  Add to list thanks

## 2011-09-11 NOTE — Telephone Encounter (Signed)
rx sent to pharmacy

## 2011-09-20 ENCOUNTER — Encounter: Payer: Self-pay | Admitting: *Deleted

## 2011-09-20 ENCOUNTER — Ambulatory Visit (INDEPENDENT_AMBULATORY_CARE_PROVIDER_SITE_OTHER): Payer: Medicare Other | Admitting: Family Medicine

## 2011-09-20 ENCOUNTER — Encounter: Payer: Self-pay | Admitting: Family Medicine

## 2011-09-20 VITALS — BP 130/70 | HR 60 | Temp 97.5°F | Ht 67.5 in | Wt 181.2 lb

## 2011-09-20 DIAGNOSIS — M545 Low back pain, unspecified: Secondary | ICD-10-CM

## 2011-09-20 DIAGNOSIS — G8929 Other chronic pain: Secondary | ICD-10-CM

## 2011-09-20 DIAGNOSIS — E785 Hyperlipidemia, unspecified: Secondary | ICD-10-CM

## 2011-09-20 DIAGNOSIS — J4489 Other specified chronic obstructive pulmonary disease: Secondary | ICD-10-CM

## 2011-09-20 DIAGNOSIS — N189 Chronic kidney disease, unspecified: Secondary | ICD-10-CM

## 2011-09-20 DIAGNOSIS — Z79899 Other long term (current) drug therapy: Secondary | ICD-10-CM

## 2011-09-20 DIAGNOSIS — E119 Type 2 diabetes mellitus without complications: Secondary | ICD-10-CM

## 2011-09-20 DIAGNOSIS — J449 Chronic obstructive pulmonary disease, unspecified: Secondary | ICD-10-CM

## 2011-09-20 LAB — HEPATIC FUNCTION PANEL
ALT: 13 U/L (ref 0–53)
AST: 13 U/L (ref 0–37)
Alkaline Phosphatase: 60 U/L (ref 39–117)
Total Bilirubin: 0.2 mg/dL — ABNORMAL LOW (ref 0.3–1.2)

## 2011-09-20 LAB — HEMOGLOBIN A1C: Hgb A1c MFr Bld: 6.6 % — ABNORMAL HIGH (ref 4.6–6.5)

## 2011-09-20 LAB — BASIC METABOLIC PANEL
BUN: 38 mg/dL — ABNORMAL HIGH (ref 6–23)
Chloride: 102 mEq/L (ref 96–112)
Glucose, Bld: 65 mg/dL — ABNORMAL LOW (ref 70–99)
Potassium: 4.7 mEq/L (ref 3.5–5.1)
Sodium: 138 mEq/L (ref 135–145)

## 2011-09-20 MED ORDER — FENTANYL 12 MCG/HR TD PT72: 1.0000 | MEDICATED_PATCH | TRANSDERMAL | Status: DC

## 2011-09-20 MED ORDER — METFORMIN HCL 850 MG PO TABS: 850.0000 mg | ORAL_TABLET | Freq: Two times a day (BID) | ORAL | Status: DC

## 2011-09-20 NOTE — Progress Notes (Signed)
Nature conservation officer at [facility] [address] [place] [address] Phone: [phone] Fax: [phone]   Patient Name: Timothy Cordova Date of Birth: [birth year] Age: 78 y.o. Medical Record Number: [id number] Gender: male Date of Encounter: 09/20/2011  History of Present Illness:  Timothy Cordova is a 78 y.o. very pleasant male patient who presents with the following:  Doing well with the pain patch - doing much, much better control of back pain with q 48 changes  Diabetes Mellitus: Tolerating Medications: some nausea Compliance with diet: good Exercise: none Avg blood sugars at home: sometimes a little low Foot problems: none Hypoglycemia: none + nausea, no vomitting, blurred vision, polyuria.  Lab Results  Component Value Date   HGBA1C 7.3* 08/12/2009    Wt Readings from Last 3 Encounters:  09/20/11 181 lb 4 oz (82.214 kg)  06/15/11 183 lb 12.8 oz (83.371 kg)  05/17/11 186 lb (84.369 kg)    Body mass index is 27.97 kg/(m^2).   Lipids: Doing well, stable. Tolerating meds fine with no SE. Panel reviewed with patient.  Lipids:    Component Value Date/Time   CHOL 107 03/15/2011 1051   TRIG 143.0 03/15/2011 1051   HDL 36.70* 03/15/2011 1051   VLDL 28.6 03/15/2011 1051   CHOLHDL 3 03/15/2011 1051    Lab Results  Component Value Date   ALT 20 03/15/2011   AST 18 03/15/2011   ALKPHOS 51 03/15/2011   BILITOT 0.3 03/15/2011    CKD:  Basic Metabolic Panel:    Component Value Date/Time   NA 139 03/15/2011 1051   K 4.6 03/15/2011 1051   CL 103 03/15/2011 1051   CO2 28 03/15/2011 1051   BUN 44* 03/15/2011 1051   CREATININE 1.7* 03/15/2011 1051   GLUCOSE 72 03/15/2011 1051   CALCIUM 9.4 03/15/2011 1051   Has been stable  No SOB  Past Medical History, Surgical History, Social History, Family History, Problem List, Medications, and Allergies have been reviewed and updated if relevant.  Prior to Admission medications   Medication Sig Start Date End Date  Taking? Authorizing Provider  albuterol (PROAIR HFA) 108 (90 BASE) MCG/ACT inhaler Inhale 2 puffs into the lungs every 6 (six) hours as needed for wheezing. 02/09/11 02/09/12 Yes [name], MD  aspirin 81 MG EC tablet Take 81 mg by mouth daily.     Yes Historical Provider, MD  doxazosin (CARDURA) 4 MG tablet Take 1 tablet (4 mg total) by mouth at bedtime. 1-2 tablets daily as directed 03/15/11  Yes Hannah Beat, MD  fentaNYL (DURAGESIC - DOSED MCG/HR) 12 MCG/HR  08/14/11  Yes Historical Provider, MD  furosemide (LASIX) 40 MG tablet Take 1 tablet (40 mg total) by mouth daily. 02/28/11  Yes Laurey Morale, MD  glipiZIDE (GLUCOTROL XL) 10 MG 24 hr tablet Take 1 tablet (10 mg total) by mouth daily. 05/30/11  Yes Hannah Beat, MD  levalbuterol (XOPENEX HFA) 45 MCG/ACT inhaler Inhale 1-2 puffs into the lungs every 4 (four) hours as needed. 09/11/11  Yes Naseem Adler, MD  losartan (COZAAR) 50 MG tablet Take 0.5 tablets (25 mg total) by mouth daily. 01/24/11  Yes Amy Michelle Nasuti, MD  metFORMIN (GLUCOPHAGE) 1000 MG tablet Take 1 tablet (1,000 mg total) by mouth 2 (two) times daily with a meal. 06/08/11  Yes Hannah Beat, MD  Multiple Vitamin (MULTIVITAMIN) tablet Take 1 tablet by mouth daily.     Yes Historical Provider, MD  nebivolol (BYSTOLIC) 2.5 MG tablet Take  1 tablet (2.5 mg total) by mouth daily. 12/21/10 12/21/11 Yes Hannah Beat, MD  oxyCODONE-acetaminophen (PERCOCET) 5-325 MG per tablet  06/15/11  Yes Historical Provider, MD  simvastatin (ZOCOR) 40 MG tablet Take 1 tablet (40 mg total) by mouth at bedtime. 03/07/11  Yes Jasminne Mealy, MD  valACYclovir (VALTREX) 1000 MG tablet Take 1 tablet (1,000 mg total) by mouth 3 (three) times daily. 06/15/11 06/14/12 Yes Hannah Beat, MD    Review of Systems:  GEN: No acute illnesses, no fevers, chills. GI: No n/v/d, eating normally Pulm: No SOB Interactive and getting along well at home.  Otherwise, ROS is as per the HPI.   Physical  Examination: Filed Vitals:   09/20/11 0749  BP: 130/70  Pulse: 60  Temp: 97.5 F (36.4 C)   Filed Vitals:   09/20/11 0749  Height: 5' 7.5" (1.715 m)  Weight: 181 lb 4 oz (82.214 kg)   Body mass index is 27.97 kg/(m^2).   GEN: WDWN, NAD, Non-toxic, A & O x 3 HEENT: Atraumatic, Normocephalic. Neck supple. No masses, No LAD. Ears and Nose: No external deformity. CV: RRR, No M/G/R. No JVD. No thrill. No extra heart sounds. PULM: CTA B, no wheezes, crackles, rhonchi. No retractions. No resp. distress. No accessory muscle use. EXTR: No c/c/e NEURO Normal gait.  PSYCH: Normally interactive. Conversant. Not depressed or anxious appearing.  Calm demeanor.    Assessment and Plan: 1. Other and unspecified hyperlipidemia    2. Type II or unspecified type diabetes mellitus without mention of complication, not stated as uncontrolled  Hemoglobin A1c, Microalbumin / creatinine urine ratio  3. Encounter for long-term (current) use of other medications  Basic metabolic panel, Hepatic function panel  4. COPD    5. CKD (chronic kidney disease)    6. Chronic low back pain      Decrease metformin to 850 mg bid, call if still getting nausea  Refill fentanyl  Hannah Beat, MD

## 2011-10-09 ENCOUNTER — Other Ambulatory Visit: Payer: Self-pay | Admitting: *Deleted

## 2011-10-09 MED ORDER — SIMVASTATIN 40 MG PO TABS: 40.0000 mg | ORAL_TABLET | Freq: Every day | ORAL | Status: DC

## 2011-10-23 ENCOUNTER — Other Ambulatory Visit: Payer: Self-pay | Admitting: *Deleted

## 2011-10-23 NOTE — Telephone Encounter (Signed)
Please call  Dr. Delton Coombes stopped this last year when he was having some urinary issues  Why is he requesting a refill now? Any trouble breathing or worsened problems?

## 2011-10-23 NOTE — Telephone Encounter (Signed)
Patient request refill on symbicort 160 not on medication list okay to refill?

## 2011-10-24 NOTE — Telephone Encounter (Signed)
Patient couldn't remember why he stopped. Once i advised him why de decided he didn't want it.

## 2011-10-25 ENCOUNTER — Other Ambulatory Visit: Payer: Self-pay | Admitting: *Deleted

## 2011-10-25 MED ORDER — FUROSEMIDE 40 MG PO TABS: 40.0000 mg | ORAL_TABLET | Freq: Every day | ORAL | Status: DC

## 2011-10-25 NOTE — Telephone Encounter (Signed)
Refilled furosemide

## 2011-11-15 ENCOUNTER — Ambulatory Visit (INDEPENDENT_AMBULATORY_CARE_PROVIDER_SITE_OTHER): Payer: Medicare Other | Admitting: Family Medicine

## 2011-11-15 ENCOUNTER — Encounter: Payer: Self-pay | Admitting: Family Medicine

## 2011-11-15 VITALS — BP 120/62 | HR 84 | Temp 97.5°F | Ht 67.5 in | Wt 184.0 lb

## 2011-11-15 DIAGNOSIS — R0989 Other specified symptoms and signs involving the circulatory and respiratory systems: Secondary | ICD-10-CM

## 2011-11-15 DIAGNOSIS — R0600 Dyspnea, unspecified: Secondary | ICD-10-CM

## 2011-11-15 DIAGNOSIS — R21 Rash and other nonspecific skin eruption: Secondary | ICD-10-CM

## 2011-11-15 MED ORDER — PREDNISONE 20 MG PO TABS: ORAL_TABLET | ORAL | Status: AC

## 2011-11-15 MED ORDER — METHYLPREDNISOLONE ACETATE 40 MG/ML IJ SUSP
80.0000 mg | Freq: Once | INTRAMUSCULAR | Status: AC
  Administered 2011-11-15: 80 mg via INTRAMUSCULAR

## 2011-11-15 NOTE — Progress Notes (Signed)
Nature conservation officer at [facility] [address] [place] [address] Phone: [phone] Fax: [phone]  Date:  11/15/2011   Name:  BLAIR LUNDEEN   DOB:  10-08-1928   MRN:  [id number]  PCP:  Hannah Beat, MD    Chief Complaint: Allergic Reaction   History of Present Illness:  Timothy Cordova is a 78 y.o. very pleasant male patient who presents with the following:  Acute systemic rash throughout body, back, arms, legs, thorax, without any known ingestion or new meds. No herbals. No new topicals, soap, detergents. Yesterday he did do a lot of weed eating. Some SOB, COPD at baseline without any facial or lip swelling.  Past Medical History, Surgical History, Social History, Family History, Problem List, Medications, and Allergies have been reviewed and updated if relevant.  Current Outpatient Prescriptions on File Prior to Visit  Medication Sig Dispense Refill  . albuterol (PROAIR HFA) 108 (90 BASE) MCG/ACT inhaler Inhale 2 puffs into the lungs every 6 (six) hours as needed for wheezing.  1 Inhaler  5  . aspirin 81 MG EC tablet Take 81 mg by mouth daily.        [name] doxazosin (CARDURA) 4 MG tablet Take 1 tablet (4 mg total) by mouth at bedtime. 1-2 tablets daily as directed  180 tablet  2  . fentaNYL (DURAGESIC - DOSED MCG/HR) 12 MCG/HR Place 1 patch (12.5 mcg total) onto the skin every other day.  15 patch  0  . furosemide (LASIX) 40 MG tablet Take 1 tablet (40 mg total) by mouth daily.  30 tablet  2  . glipiZIDE (GLUCOTROL XL) 10 MG 24 hr tablet Take 1 tablet (10 mg total) by mouth daily.  90 tablet  3  . levalbuterol (XOPENEX HFA) 45 MCG/ACT inhaler Inhale 1-2 puffs into the lungs every 4 (four) hours as needed.  1 Inhaler  2  . losartan (COZAAR) 50 MG tablet Take 0.5 tablets (25 mg total) by mouth daily.  30 tablet  6  . metFORMIN (GLUCOPHAGE) 850 MG tablet Take 1 tablet (850 mg total) by mouth 2 (two) times daily with a meal.  60 tablet  11  . Multiple Vitamin  (MULTIVITAMIN) tablet Take 1 tablet by mouth daily.        . nebivolol (BYSTOLIC) 2.5 MG tablet Take 1 tablet (2.5 mg total) by mouth daily.  30 tablet  11  . oxyCODONE-acetaminophen (PERCOCET) 5-325 MG per tablet       . simvastatin (ZOCOR) 40 MG tablet Take 1 tablet (40 mg total) by mouth at bedtime.  90 tablet  3  . valACYclovir (VALTREX) 1000 MG tablet Take 1 tablet (1,000 mg total) by mouth 3 (three) times daily.  21 tablet  0  . DISCONTD: gabapentin (NEURONTIN) 300 MG capsule Take 300 mg by mouth 2 (two) times daily.        [name] DISCONTD: tiotropium (SPIRIVA) 18 MCG inhalation capsule Place 1 capsule (18 mcg total) into inhaler and inhale daily.  30 capsule  5    Review of Systems: ROS: GEN: Acute illness details above GI: Tolerating PO intake GU: maintaining adequate hydration and urination Pulm: No SOB Interactive and getting along well at home.  Otherwise, ROS is as per the HPI.   Physical Examination: Filed Vitals:   11/15/11 0816  BP: 120/62  Pulse: 84  Temp: 97.5 F (36.4 C)   Filed Vitals:   11/15/11 0816  Height: 5' 7.5" (1.715 m)  Weight: 184  lb (83.462 kg)   Body mass index is 28.39 kg/(m^2). Ideal Body Weight: Weight in (lb) to have BMI = 25: 161.7    GEN: WDWN, NAD, Non-toxic, Alert & Oriented x 3 HEENT: Atraumatic, Normocephalic.  Ears and Nose: No external deformity. EXTR: No clubbing/cyanosis/edema NEURO: Normal gait.  PSYCH: Normally interactive. Conversant. Not depressed or anxious appearing.  Calm demeanor.  SKIN: scattered hives, arms, legs, chest, back, legs  Assessment and Plan: 1. Rash  methylPREDNISolone acetate (DEPO-MEDROL) injection 80 mg  2. Dyspnea  methylPREDNISolone acetate (DEPO-MEDROL) injection 80 mg    Diffuse urticaria --- unclear etiology  Depo-Medrol, pred, benadryl  Reviewed red flags, to ED if facial swelling  Hannah Beat, MD

## 2011-11-22 ENCOUNTER — Telehealth: Payer: Self-pay

## 2011-11-22 MED ORDER — METFORMIN HCL 500 MG PO TABS: 500.0000 mg | ORAL_TABLET | Freq: Two times a day (BID) | ORAL | Status: DC

## 2011-11-22 NOTE — Telephone Encounter (Signed)
Pt said Dr Patsy Lager advised pt due to metallic taste in pts mouth could cut Metformin tab and take Metformin 850 mg taking 1/2 tab twice a day.  Pt said pharmacist advised not to cut Metformin in half due to being slow release tab. Pt request lower dosage of Metformin sent to [facility].Please advise.

## 2011-11-22 NOTE — Telephone Encounter (Signed)
Decrease to metformin 500 mg po bid  Electronically prescribe to the pharmacy of their choice. (May call in if pharmacy does not participate in electronic prescriptions) Call in #30, 11 refills. OR if they prefer a 90 day supply, #90 with 3 refills is OK, too Prescription instructions above

## 2011-11-22 NOTE — Telephone Encounter (Signed)
rx sent to pharmacy

## 2011-12-25 ENCOUNTER — Encounter: Payer: Self-pay | Admitting: Emergency Medicine

## 2011-12-25 ENCOUNTER — Ambulatory Visit (INDEPENDENT_AMBULATORY_CARE_PROVIDER_SITE_OTHER): Payer: Medicare Other | Admitting: Emergency Medicine

## 2011-12-25 VITALS — BP 128/50 | HR 70 | Temp 98.2°F | Ht 67.5 in | Wt 185.2 lb

## 2011-12-25 DIAGNOSIS — J449 Chronic obstructive pulmonary disease, unspecified: Secondary | ICD-10-CM

## 2011-12-25 NOTE — Assessment & Plan Note (Signed)
Doing well - no changes in regimen

## 2011-12-25 NOTE — Progress Notes (Signed)
Timothy Cordova is a 78 year old man with a history of moderate COPD, allergic rhinitis, coronary artery disease (patent grafts on last cath, [name])   [name] 04/19/09 -- returns for regular f/u. Now on cozaar and tolerating well. Breathing well, no flares or hospitalizations. Remains active, does get some sob with prolonged activity. Doing stationary bike. Hasn't restarted tobacco.   ROV 12/06/09 -- follows up for acute-on-chronic dyspnea in setting COPD, CAD. He saw [name] in 04/2009 for less severe symptoms, vein grafts were patent and PAOP was 18. He now is having SOB with simply walking to mailbox. Was previously doing exercise bike. He has had improvement in breathing in past with lasix (he uses as needed for LE edema). Remains on Spiriva and Symbicort + Proventil. No wheeze, no cough, no mucous, no CP. He has had panting and pre-syncope.   ROV 12/10/09 -- returns for f/u of his progressive SOB. His cards eval has been reassuring. CXR shows emphysema, some kyphosis, no infiltrates or pulm edema. Of note, he has gained 30 lbs since beginning of the yr![name]   ROV 12/22/10 -- COPD, Hx CAD, exertional dyspnea. Returns for annual f/u. He has been doing fairly well, no exacerbations since last visit. Still with some exertional SOB. He has been having problems with voiding at night, made better by stopping his night dose of Symbicort. He uses albuterol rarely, about 1x a week.   ROV 12/25/11 -- COPD, Hx CAD, exertional dyspnea. Returns for annual f/u. He has lost some wt, about 20 lbs.  He is off Spiriva and Symbicort and does not miss them. He uses albuterol almost every day. He was on pred once in last week, it was for a rxn to metformin.   Filed Vitals:   12/25/11 1512  BP: 128/50  Pulse: 70  Temp: 98.2 F (36.8 C)   Gen: Pleasant, well-nourished, in no distress,  normal affect  ENT: No lesions,  mouth clear,  oropharynx clear, no postnasal drip  Neck: No JVD, no TMG, no carotid bruits  Lungs:  Distant, very slight wheeze  Cardiovascular: RRR, heart sounds normal, no murmur or gallops, no peripheral edema  Musculoskeletal: No deformities, no cyanosis or clubbing  Neuro: alert, non focal   COPD Doing well - no changes in regimen

## 2011-12-25 NOTE — Patient Instructions (Addendum)
Please continue to use your albuterol as needed Follow with Dr Delton Coombes in 1 year or sooner if needed.

## 2012-01-05 ENCOUNTER — Other Ambulatory Visit: Payer: Self-pay | Admitting: *Deleted

## 2012-01-05 NOTE — Telephone Encounter (Signed)
Bystolic 

## 2012-01-05 NOTE — Telephone Encounter (Signed)
Received faxed rx request for Bystolic 5 mg take one tablet by mouth every day, Our records show last rx was for 2.5 mg daily. I called pt and he verified that he has been taking 5 mg one tab daily. Is it ok to change rx and refill the Bystolic 5 mg qd?

## 2012-01-05 NOTE — Telephone Encounter (Signed)
Okay to change prescription and refill.

## 2012-01-11 ENCOUNTER — Other Ambulatory Visit: Payer: Self-pay

## 2012-01-11 MED ORDER — NEBIVOLOL HCL 5 MG PO TABS: 5.0000 mg | ORAL_TABLET | Freq: Every day | ORAL | Status: DC

## 2012-01-11 NOTE — Telephone Encounter (Signed)
Spoke to Rob at [employer] [facility], Bystolic was called in to 5 mg with 11 R. Will change in patient medication list.

## 2012-02-19 ENCOUNTER — Ambulatory Visit (INDEPENDENT_AMBULATORY_CARE_PROVIDER_SITE_OTHER): Payer: Medicare Other | Admitting: Family Medicine

## 2012-02-19 ENCOUNTER — Telehealth: Payer: Self-pay | Admitting: Family Medicine

## 2012-02-19 ENCOUNTER — Encounter: Payer: Self-pay | Admitting: Family Medicine

## 2012-02-19 VITALS — BP 100/60 | HR 105 | Temp 97.6°F | Ht 67.5 in | Wt 184.0 lb

## 2012-02-19 DIAGNOSIS — J441 Chronic obstructive pulmonary disease with (acute) exacerbation: Secondary | ICD-10-CM

## 2012-02-19 DIAGNOSIS — J209 Acute bronchitis, unspecified: Secondary | ICD-10-CM

## 2012-02-19 MED ORDER — PREDNISONE 20 MG PO TABS: ORAL_TABLET | ORAL | Status: DC

## 2012-02-19 MED ORDER — DOXYCYCLINE HYCLATE 100 MG PO TABS: 100.0000 mg | ORAL_TABLET | Freq: Two times a day (BID) | ORAL | Status: DC

## 2012-02-19 NOTE — Progress Notes (Signed)
Nature conservation officer at [facility] [address] [place] [address] Phone: [phone] Fax: [phone]  Date:  02/19/2012   Name:  Timothy Cordova   DOB:  September 13, 1928   MRN:  [id number] Gender: male Age: 78 y.o.  PCP:  Hannah Beat, MD  Evaluating MD: Hannah Beat, MD   Chief Complaint: Cough   History of Present Illness:  Timothy Cordova is a 78 y.o. pleasant patient who presents with the following:  96% on recheck -- started off with a sore throat on Thursday. Has been coughing up some clear and bubbling stuff -- hart to get up. Some URI sx. Some SOB, no CP. Fever about 99-100 over the weekend. Decreased PO intake. Urinating normally.     Patient Active Problem List  Diagnosis  . DIABETES MELLITUS, TYPE II  . MIXED HYPERLIPIDEMIA  . DEMENTIA  . HEARING LOSS  . HYPERTENSION  . CAD, AUTOLOGOUS BYPASS GRAFT  . DIASTOLIC HEART FAILURE, CHRONIC  . ALLERGIC RHINITIS  . COPD  . BENIGN PROSTATIC HYPERTROPHY  . OSTEOARTHRITIS  . DIZZINESS  . FATIGUE  . TOBACCO ABUSE, HX OF  . CKD (chronic kidney disease)  . Renal lesion  . Chronic low back pain    Past Medical History  Diagnosis Date  . Coronary artery disease     s/p CABG 2006. LHC (1/10): SVG-OM and PLOM, SVG-D, and LIMA-LAD were all patent  . Hypertension   . COPD (chronic obstructive pulmonary disease)     moderate. followed by Dr.Byrum  . Chronic diastolic heart failure     8/08 ECHO with EF 55%  . Edema     localized. suspect diastolic CHF + venous insufficiency  . Mixed hyperlipidemia   . Sleep apnea     ?  . Diabetes mellitus   . History of tobacco abuse   . Allergic rhinitis   . Hypercholesterolemia   . Benign prostatic hypertrophy   . Osteoarthritis   . Dementia     (mild)--06/2006  . Hearing loss     left >>right  . CKD (chronic kidney disease)   . Echocardiogram abnormal 07/2006    EF 55%, mild LAE    Past Surgical History  Procedure Date  . Angioplasty [id number]    . Rotator cuff repair 2005    left, Gioffre  . Coronary artery bypass graft 02/2005    History  Substance Use Topics  . Smoking status: Former Smoker -- 3.0 packs/day for 60 years    Types: Cigarettes    Quit date: 07/12/2005  . Smokeless tobacco: Never Used  . Alcohol Use: No    Family History  Problem Relation Age of Onset  . Heart attack Mother   . Lung cancer Sister     Allergies  Allergen Reactions  . Tramadol Hcl     Tremor vs seizure activity  . Ace Inhibitors     REACTION: cough  . Atorvastatin     REACTION: weakness  . Codeine   . Morphine Sulfate     REACTION: unspecified  . Ticlopidine Hcl     REACTION: unspecified  . Warfarin Sodium     Medication list has been reviewed and updated.  Outpatient Prescriptions Prior to Visit  Medication Sig Dispense Refill  . aspirin 81 MG EC tablet Take 81 mg by mouth daily.        [name] doxazosin (CARDURA) 4 MG tablet Take 1 tablet (4 mg total) by mouth at bedtime. 1-2 tablets daily as  directed  180 tablet  2  . furosemide (LASIX) 40 MG tablet Take 1 tablet (40 mg total) by mouth daily.  30 tablet  2  . glipiZIDE (GLUCOTROL XL) 10 MG 24 hr tablet Take 1 tablet (10 mg total) by mouth daily.  90 tablet  3  . losartan (COZAAR) 50 MG tablet Take 0.5 tablets (25 mg total) by mouth daily.  30 tablet  6  . metFORMIN (GLUCOPHAGE) 500 MG tablet Take 1 tablet (500 mg total) by mouth 2 (two) times daily with a meal.  60 tablet  11  . Multiple Vitamin (MULTIVITAMIN) tablet Take 1 tablet by mouth daily.        . nebivolol (BYSTOLIC) 5 MG tablet Take 1 tablet (5 mg total) by mouth daily.  30 tablet  11  . simvastatin (ZOCOR) 40 MG tablet Take 1 tablet (40 mg total) by mouth at bedtime.  90 tablet  3  . albuterol (PROAIR HFA) 108 (90 BASE) MCG/ACT inhaler Inhale 2 puffs into the lungs every 6 (six) hours as needed for wheezing.  1 Inhaler  5  . fentaNYL (DURAGESIC - DOSED MCG/HR) 12 MCG/HR Place 1 patch (12.5 mcg total) onto the skin  every other day.  15 patch  0  . levalbuterol (XOPENEX HFA) 45 MCG/ACT inhaler Inhale 1-2 puffs into the lungs every 4 (four) hours as needed.  1 Inhaler  2  . oxyCODONE-acetaminophen (PERCOCET) 5-325 MG per tablet       . valACYclovir (VALTREX) 1000 MG tablet Take 1 tablet (1,000 mg total) by mouth 3 (three) times daily.  21 tablet  0   Last reviewed on 02/19/2012 10:09 AM by Consuello Masse, CMA  Review of Systems:  ROS: GEN: Acute illness details above GI: Tolerating PO intake GU: maintaining adequate hydration and urination Pulm: No SOB Interactive and getting along well at home.  Otherwise, ROS is as per the HPI.   Physical Examination: Filed Vitals:   02/19/12 1006 02/19/12 1039  BP: 100/60   Pulse: 105   Temp: 97.6 F (36.4 C)   TempSrc: Oral   Height: 5' 7.5" (1.715 m)   Weight: 184 lb (83.462 kg)   SpO2: 92% 96%     Body mass index is 28.39 kg/(m^2). Ideal Body Weight: Weight in (lb) to have BMI = 25: 161.7    GEN: A and O x 3. WDWN. NAD.    ENT: Nose clear, ext NML.  No LAD.  No JVD.  TM's clear. Oropharynx clear.  PULM: Normal WOB, no distress. Diffuse intermittent wheezing with some rhonchi, no crackles CV: RRR, no M/G/R, No rubs, No JVD.   EXT: warm and well-perfused, No c/c/e. PSYCH: Pleasant and conversant.   Assessment and Plan:  1. COPD exacerbation   2. Bronchitis, acute, with bronchospasm    Steroids, ABX  F/u if not improving or worsens  Orders Today:  No orders of the defined types were placed in this encounter.    Updated Medication List: (Includes new medications, updates to list, dose adjustments) Meds ordered this encounter  Medications  . doxycycline (VIBRA-TABS) 100 MG tablet    Sig: Take 1 tablet (100 mg total) by mouth 2 (two) times daily.    Dispense:  20 tablet    Refill:  0  . predniSONE (DELTASONE) 20 MG tablet    Sig: 2 tabs po daily for 5 days, then 1 po for 3 days    Dispense:  13 tablet    Refill:  0     Medications Discontinued: There are no discontinued medications.   Hannah Beat, MD

## 2012-02-19 NOTE — Telephone Encounter (Signed)
Caller: [name]/Patient; Patient Name: Timothy Cordova; PCP: Hannah Beat [facility]); Best Callback Phone Number: [phone] Raspy breathing and persistant cough.  Taking OTC Alka-Seltzer Cold formula and using Albuterol Inhaler 1 puff every 4 hours. Sx started on 02/15/12 and low grade fever on 02/17/12. Afebrile today.  Coughing is worse at night and he is having to sleep sitting up in recyliner. He has some facial pain, chest feels full and he is coughing up frothy pink tinged sputum. Triage per Diabetes: Respiratory Problems Protocol and appointment advised within 4 hours for "Productive cough with colored sputum". Appointment scheduled with Dr. Patsy Lager at 1015- 02/19/12.

## 2012-03-04 ENCOUNTER — Other Ambulatory Visit: Payer: Self-pay

## 2012-03-04 MED ORDER — LEVALBUTEROL TARTRATE 45 MCG/ACT IN AERO: 1.0000 | INHALATION_SPRAY | RESPIRATORY_TRACT | Status: DC | PRN

## 2012-03-04 NOTE — Telephone Encounter (Signed)
[facility] faxed refill Xopenex. Last filled 03/04/12.Please advise.

## 2012-03-04 NOTE — Telephone Encounter (Signed)
Confusing message, please call pharmacy and see if they need a refill if they do, refill it with 5 refills

## 2012-03-05 NOTE — Telephone Encounter (Signed)
It appears this was done?

## 2012-03-22 ENCOUNTER — Other Ambulatory Visit: Payer: Self-pay | Admitting: Urology

## 2012-03-22 DIAGNOSIS — N281 Cyst of kidney, acquired: Secondary | ICD-10-CM

## 2012-03-29 ENCOUNTER — Ambulatory Visit
Admission: RE | Admit: 2012-03-29 | Discharge: 2012-03-29 | Disposition: A | Discharge status: Home / Self Care | Payer: Medicare Other | Source: Ambulatory Visit | Attending: Urology | Admitting: Urology

## 2012-03-29 DIAGNOSIS — N281 Cyst of kidney, acquired: Secondary | ICD-10-CM

## 2012-05-14 ENCOUNTER — Ambulatory Visit (INDEPENDENT_AMBULATORY_CARE_PROVIDER_SITE_OTHER): Payer: Medicare Other | Admitting: Family Medicine

## 2012-05-14 ENCOUNTER — Other Ambulatory Visit: Payer: Self-pay | Admitting: *Deleted

## 2012-05-14 ENCOUNTER — Encounter: Payer: Self-pay | Admitting: Family Medicine

## 2012-05-14 ENCOUNTER — Telehealth: Payer: Self-pay | Admitting: Family Medicine

## 2012-05-14 VITALS — BP 120/74 | HR 66 | Temp 97.5°F | Ht 67.5 in | Wt 189.8 lb

## 2012-05-14 DIAGNOSIS — E119 Type 2 diabetes mellitus without complications: Secondary | ICD-10-CM

## 2012-05-14 DIAGNOSIS — J441 Chronic obstructive pulmonary disease with (acute) exacerbation: Secondary | ICD-10-CM

## 2012-05-14 MED ORDER — AZITHROMYCIN 250 MG PO TABS: ORAL_TABLET | ORAL | Status: DC

## 2012-05-14 MED ORDER — PREDNISONE 20 MG PO TABS: ORAL_TABLET | ORAL | Status: DC

## 2012-05-14 MED ORDER — DOXAZOSIN MESYLATE 4 MG PO TABS: 4.0000 mg | ORAL_TABLET | Freq: Every day | ORAL | Status: DC

## 2012-05-14 NOTE — Progress Notes (Signed)
  Subjective:    Patient ID: Timothy Cordova, male    DOB: 01-10-1929, 79 y.o.   MRN: [id number]  Cough This is a new problem. The current episode started 1 to 4 weeks ago (2 weeks). The problem has been gradually worsening. The problem occurs constantly. The cough is productive of sputum (change in sputum). Associated symptoms include chills, a fever, nasal congestion, a sore throat, shortness of breath and wheezing. Pertinent negatives include no chest pain, ear congestion, ear pain, headaches or postnasal drip. Associated symptoms comments: Sinus pressure. Exacerbated by: worse later in day. Risk factors for lung disease include smoking/tobacco exposure (former smoker quit 7 years). Treatments tried: using alkaseltzer plus. using 2 times a day. His past medical history is significant for COPD and environmental allergies. got flu shot in 2013.  Has DM. Has had blood sugar increase in past with prednsione.   Lab Results  Component Value Date   HGBA1C 6.6* 09/20/2011       Review of Systems  Constitutional: Positive for fever and chills.  HENT: Positive for sore throat. Negative for ear pain and postnasal drip.   Respiratory: Positive for cough, shortness of breath and wheezing.   Cardiovascular: Negative for chest pain.  Neurological: Negative for headaches.  Hematological: Positive for environmental allergies.       Objective:   Physical Exam  Constitutional: Vital signs are normal. He appears well-developed and well-nourished.  Non-toxic appearance. He does not appear ill. No distress.  HENT:  Head: Normocephalic and atraumatic.  Right Ear: Hearing, tympanic membrane, external ear and ear canal normal. No tenderness. No foreign bodies. Tympanic membrane is not retracted and not bulging.  Left Ear: Hearing, tympanic membrane, external ear and ear canal normal. No tenderness. No foreign bodies. Tympanic membrane is not retracted and not bulging.  Nose: Nose normal. No mucosal  edema or rhinorrhea. Right sinus exhibits no maxillary sinus tenderness and no frontal sinus tenderness. Left sinus exhibits no maxillary sinus tenderness and no frontal sinus tenderness.  Mouth/Throat: Uvula is midline, oropharynx is clear and moist and mucous membranes are normal. Normal dentition. No dental caries. No oropharyngeal exudate or tonsillar abscesses.  Eyes: Conjunctivae normal, EOM and lids are normal. Pupils are equal, round, and reactive to light. No foreign bodies found.  Neck: Trachea normal, normal range of motion and phonation normal. Neck supple. Carotid bruit is not present. No mass and no thyromegaly present.  Cardiovascular: Normal rate, regular rhythm, S1 normal, S2 normal, normal heart sounds, intact distal pulses and normal pulses.  Exam reveals no gallop.   No murmur heard. Pulmonary/Chest: Effort normal. No respiratory distress. He has decreased breath sounds. He has wheezes. He has no rhonchi. He has no rales.  Abdominal: Soft. Normal appearance and bowel sounds are normal. There is no hepatosplenomegaly. There is no tenderness. There is no rebound, no guarding and no CVA tenderness. No hernia.  Neurological: He is alert. He has normal reflexes.  Skin: Skin is warm, dry and intact. No rash noted.  Psychiatric: He has a normal mood and affect. His speech is normal and behavior is normal. Judgment normal.          Assessment & Plan:

## 2012-05-14 NOTE — Assessment & Plan Note (Signed)
Treat with prednisone, antibiotics given sputum change. Close follow up if not improving in 48-72 hours.

## 2012-05-14 NOTE — Assessment & Plan Note (Signed)
Follow blood sugar at home closely on prednisone. Call if FBS >200 for med adjustment.

## 2012-05-14 NOTE — Patient Instructions (Addendum)
Start azithromycin for COPD exacerbation. Complete a steroid taper x 6 days. Use xopenex for wheezing as needed. Follow blood sugar closely at home... Call if blood sugars running over 200 fasting. If it does run high we will likely increase metformin temporarily. Follow up if not improving before weekend.

## 2012-05-14 NOTE — Telephone Encounter (Signed)
Patient Information:  Caller Name: [name]  Phone: [phone]  Patient: Timothy Cordova, Timothy Cordova  Gender: Male  DOB: Jul 23, 1928  Age: 79 Years  PCP: Hannah Beat (Family Practice)  Office Follow Up:  Does the office need to follow up with this patient?: No  Instructions For The Office: N/A   Symptoms  Reason For Call & Symptoms: Pt has had a cough x 1 1/2 weeks. Spouse states the pt is hard-headed and wont' come at the beginning of his illness which he needs to do. He waits until it gets bad and then has her call for appt. . Pt has C.O.P.D. Pt has temp of 101 x 2 days. Cough is worsening in severity and frequency.   Reviewed Health History In EMR: Yes  Reviewed Medications In EMR: Yes  Reviewed Allergies In EMR: Yes  Reviewed Surgeries / Procedures: Yes  Date of Onset of Symptoms: 05/05/2012  Treatments Tried: alka seltzer plus  Treatments Tried Worked: No  Any Fever: Yes  Fever Taken: Oral  Fever Time Of Reading: 00:00:00  Fever Last Reading: 101  Guideline(s) Used:  Cough  Disposition Per Guideline:   Go to ED Now  Reason For Disposition Reached:   Difficulty breathing  Advice Given:  N/A  Patient Refused Recommendation:  Patient Refused Care Advice  wants to come to office. Has C.O.P.D and so is used to "having difficutly breathing " per his spouse. Pt scheduled at next available with Dr. Ermalene Searing at 3:30 (Dr. Patsy Lager not available). Spouse will call back if pt worsens in the meantime.

## 2012-05-20 ENCOUNTER — Other Ambulatory Visit: Payer: Self-pay | Admitting: *Deleted

## 2012-05-27 ENCOUNTER — Ambulatory Visit (INDEPENDENT_AMBULATORY_CARE_PROVIDER_SITE_OTHER): Payer: Medicare Other | Admitting: Family Medicine

## 2012-05-27 ENCOUNTER — Telehealth: Payer: Self-pay | Admitting: Family Medicine

## 2012-05-27 ENCOUNTER — Ambulatory Visit (INDEPENDENT_AMBULATORY_CARE_PROVIDER_SITE_OTHER)
Admission: RE | Admit: 2012-05-27 | Discharge: 2012-05-27 | Disposition: A | Discharge status: Home / Self Care | Payer: Medicare Other | Source: Ambulatory Visit | Attending: Family Medicine | Admitting: Family Medicine

## 2012-05-27 ENCOUNTER — Encounter: Payer: Self-pay | Admitting: Family Medicine

## 2012-05-27 VITALS — BP 120/70 | HR 105 | Temp 98.2°F | Ht 67.5 in | Wt 183.5 lb

## 2012-05-27 DIAGNOSIS — R05 Cough: Secondary | ICD-10-CM

## 2012-05-27 DIAGNOSIS — J441 Chronic obstructive pulmonary disease with (acute) exacerbation: Secondary | ICD-10-CM

## 2012-05-27 DIAGNOSIS — J189 Pneumonia, unspecified organism: Secondary | ICD-10-CM

## 2012-05-27 MED ORDER — PREDNISONE 20 MG PO TABS: ORAL_TABLET | ORAL | Status: DC

## 2012-05-27 MED ORDER — LEVOFLOXACIN 500 MG PO TABS: 500.0000 mg | ORAL_TABLET | Freq: Every day | ORAL | Status: DC

## 2012-05-27 NOTE — Telephone Encounter (Signed)
Patient has appt later today

## 2012-05-27 NOTE — Telephone Encounter (Signed)
No answer when RN did the call back.  Caller had spoken with CSR and call was dispatched for cough./congestion.  RN did not get to speak with pt or caller (no answer).  RN left message on voicemail

## 2012-05-27 NOTE — Progress Notes (Signed)
Nature conservation officer at [facility] [address] [place] [address] Phone: [phone] Fax: [phone]  Date:  05/27/2012   Name:  Timothy Cordova   DOB:  [birth year]   MRN:  [id number] Gender: male Age: 79 y.o.  Primary Physician:  Hannah Beat, MD  Evaluating MD: Hannah Beat, MD   Chief Complaint: Cough, Nasal Congestion and Fever   History of Present Illness:  Timothy Cordova is a 79 y.o. pleasant patient who presents with the following:  Elderly gentleman with multiple medical problems significant chronic obstructive pulmonary disease, history of coronary disease, status post bypass graft:  Having a lot of cough, tightening in his chest, and laying on side. Took Vicodin, got started taking 2 weeks ago to sleep. Yellow phlegn. Some brown, no blood. Pulse ox is 93% on RA today. Resp 16 by my count.   He has been doing very poorly and worsening over the last week. He has been sick for approximately 2 weeks, and 2 weeks ago he got some steroids as well as a azithromycin. He doesn't think he improved at all from this. His wife thinks that he did improve somewhat, but he certainly is not better.  Patient Active Problem List  Diagnosis  . DIABETES MELLITUS, TYPE II  . MIXED HYPERLIPIDEMIA  . DEMENTIA  . HEARING LOSS  . HYPERTENSION  . CAD, AUTOLOGOUS BYPASS GRAFT  . DIASTOLIC HEART FAILURE, CHRONIC  . ALLERGIC RHINITIS  . COPD  . BENIGN PROSTATIC HYPERTROPHY  . OSTEOARTHRITIS  . DIZZINESS  . FATIGUE  . TOBACCO ABUSE, HX OF  . CKD (chronic kidney disease)  . Renal lesion  . Chronic low back pain  . COPD with acute exacerbation    Past Medical History  Diagnosis Date  . Coronary artery disease     s/p CABG 2006. LHC (1/10): SVG-OM and PLOM, SVG-D, and LIMA-LAD were all patent  . Hypertension   . COPD (chronic obstructive pulmonary disease)     moderate. followed by Dr.Byrum  . Chronic diastolic heart failure     8/08 ECHO with EF 55%  . Edema      localized. suspect diastolic CHF + venous insufficiency  . Mixed hyperlipidemia   . Sleep apnea     ?  . Diabetes mellitus   . History of tobacco abuse   . Allergic rhinitis   . Hypercholesterolemia   . Benign prostatic hypertrophy   . Osteoarthritis   . Dementia     (mild)--06/2006  . Hearing loss     left >>right  . CKD (chronic kidney disease)   . Echocardiogram abnormal 07/2006    EF 55%, mild LAE    Past Surgical History  Procedure Laterality Date  . Angioplasty  [id number]  . Rotator cuff repair  2005    left, Gioffre  . Coronary artery bypass graft  02/2005    History  Substance Use Topics  . Smoking status: Former Smoker -- 3.00 packs/day for 60 years    Types: Cigarettes    Quit date: 07/12/2005  . Smokeless tobacco: Never Used  . Alcohol Use: No    Family History  Problem Relation Age of Onset  . Heart attack Mother   . Lung cancer Sister     Allergies  Allergen Reactions  . Tramadol Hcl     Tremor vs seizure activity  . Ace Inhibitors     REACTION: cough  . Atorvastatin     REACTION: weakness  . Codeine   .  Morphine Sulfate     REACTION: unspecified  . Ticlopidine Hcl     REACTION: unspecified  . Warfarin Sodium     Medication list has been reviewed and updated.  Outpatient Prescriptions Prior to Visit  Medication Sig Dispense Refill  . aspirin 81 MG EC tablet Take 81 mg by mouth daily.        [name] doxazosin (CARDURA) 4 MG tablet Take 1 tablet (4 mg total) by mouth at bedtime. 1-2 tablets daily as directed  180 tablet  1  . furosemide (LASIX) 40 MG tablet Take 1 tablet (40 mg total) by mouth daily.  30 tablet  2  . glipiZIDE (GLUCOTROL XL) 10 MG 24 hr tablet Take 1 tablet (10 mg total) by mouth daily.  90 tablet  3  . glucose blood test strip 1 each by Other route 2 (two) times daily.      [name] levalbuterol (XOPENEX HFA) 45 MCG/ACT inhaler Inhale 1-2 puffs into the lungs every 4 (four) hours as needed.  1 Inhaler  5  . losartan (COZAAR) 50 MG  tablet Take 0.5 tablets (25 mg total) by mouth daily.  30 tablet  6  . metFORMIN (GLUCOPHAGE) 500 MG tablet Take 1 tablet (500 mg total) by mouth 2 (two) times daily with a meal.  60 tablet  11  . Multiple Vitamin (MULTIVITAMIN) tablet Take 1 tablet by mouth daily.        . nebivolol (BYSTOLIC) 5 MG tablet Take 1 tablet (5 mg total) by mouth daily.  30 tablet  11  . simvastatin (ZOCOR) 40 MG tablet Take 1 tablet (40 mg total) by mouth at bedtime.  90 tablet  3  . albuterol (PROAIR HFA) 108 (90 BASE) MCG/ACT inhaler Inhale 2 puffs into the lungs every 6 (six) hours as needed for wheezing.  1 Inhaler  5  . azithromycin (ZITHROMAX) 250 MG tablet 2 tab po x 1 day then 1 tab po daily  6 each  0  . predniSONE (DELTASONE) 20 MG tablet 3 tabs by mouth daily x 3 days, then 2 tabs by mouth daily x 2 days then 1 tab by mouth daily x 2 days  15 tablet  0   No facility-administered medications prior to visit.    Review of Systems:  Significant shortness of breath. No chest pain. Blood sugars elevated to 300 with prior prednisone. Currently afebrile. Otherwise as above  Physical Examination: BP 120/70  Pulse 105  Temp(Src) 98.2 F (36.8 C) (Oral)  Ht 5' 7.5" (1.715 m)  Wt 183 lb 8 oz (83.235 kg)  BMI 28.3 kg/m2  SpO2 93%  Ideal Body Weight: Weight in (lb) to have BMI = 25: 161.7   GEN: A and O x 3. WDWN. NAD.    ENT: Nose clear, ext NML.  No LAD.  No JVD.  TM's clear. Oropharynx clear.  PULM: Normal WOB, no distress. Shallow breath sounds. Intermittent rhonchi, more on the R side without crackles. Minimal wheezing. CV: RRR, no M/G/R, No rubs, No JVD.   EXT: warm and well-perfused, No c/c/e. PSYCH: Pleasant and conversant.   Dg Chest 2 View  05/27/2012  *RADIOLOGY REPORT*  Clinical Data: Cough  CHEST - 2 VIEW  Comparison: 05/20/2010  Findings: Cardiomediastinal silhouette is stable.  There is streaky right base atelectasis or early infiltrate.  Hyperinflation again noted.  Nodular density  right base laterally.  Repeat frontal view of the chest with nipple markers is recommended.  IMPRESSION: There is streaky right  base atelectasis or early infiltrate. Hyperinflation again noted.  Nodular density right base laterally. Repeat frontal view of the chest with nipple markers is recommended.   Original Report Authenticated By: Natasha Mead, M.D.     Assessment and Plan:  COPD exacerbation - Plan: DG Chest 2 View  Cough - Plan: DG Chest 2 View  CAP (community acquired pneumonia)  Consistent with significant chronic obstructive pulmonary disease exacerbation and probable right-sided pneumonia. Will treat with Levaquin a longer prednisone taper. If he does poorly and does not improve with this, I will ask for pulmonary assistance  Orders Today:  Orders Placed This Encounter  Procedures  . DG Chest 2 View    Standing Status: Future     Number of Occurrences: 1     Standing Expiration Date: 07/27/2013    Order Specific Question:  Preferred imaging location?    Answer:  [facility]    Order Specific Question:  Reason for exam:    Answer:  cough    Updated Medication List: (Includes new medications, updates to list, dose adjustments) Meds ordered this encounter  Medications  . levofloxacin (LEVAQUIN) 500 MG tablet    Sig: Take 1 tablet (500 mg total) by mouth daily.    Dispense:  10 tablet    Refill:  0  . predniSONE (DELTASONE) 20 MG tablet    Sig: 3 tabs po daily for 3 days, then 2 tabs po for 5 days, then 1 tab po for 5 days    Dispense:  24 tablet    Refill:  0    Medications Discontinued: Medications Discontinued During This Encounter  Medication Reason  . predniSONE (DELTASONE) 20 MG tablet Error  . azithromycin (ZITHROMAX) 250 MG tablet Error      Signed, Undine Nealis T. Lajarvis Italiano, MD 05/27/2012 12:00 PM

## 2012-07-02 ENCOUNTER — Other Ambulatory Visit: Payer: Self-pay | Admitting: *Deleted

## 2012-07-02 MED ORDER — GLIPIZIDE ER 10 MG PO TB24: 10.0000 mg | ORAL_TABLET | Freq: Every day | ORAL | Status: DC

## 2012-07-03 ENCOUNTER — Telehealth: Payer: Self-pay | Admitting: *Deleted

## 2012-07-03 ENCOUNTER — Other Ambulatory Visit: Payer: Self-pay | Admitting: *Deleted

## 2012-07-03 NOTE — Telephone Encounter (Signed)
Called patient to see if he would like to schedule an appointment with Dr Shirlee Latch. He has not been seen in 2 yrs.  I informed him that in order for [place] to refill his medication he will need to make an appointment. Patient stated he does not know when he will be able to make an appointment but has enough furosemide to last until he decides to come in.  Micki Riley, CMA

## 2012-07-03 NOTE — Telephone Encounter (Signed)
Patient was getting this from dr Shirlee Latch and was told needed a follow up appt before further refills. [facility] sent the request to [place] for additional refills is this ok?

## 2012-07-03 NOTE — Telephone Encounter (Signed)
Denied - overdue for cards f/u.

## 2012-07-11 ENCOUNTER — Ambulatory Visit (INDEPENDENT_AMBULATORY_CARE_PROVIDER_SITE_OTHER): Payer: Medicare Other | Admitting: Emergency Medicine

## 2012-07-11 ENCOUNTER — Encounter: Payer: Self-pay | Admitting: Emergency Medicine

## 2012-07-11 ENCOUNTER — Other Ambulatory Visit: Payer: Self-pay | Admitting: *Deleted

## 2012-07-11 VITALS — BP 136/62 | HR 71 | Ht 67.0 in | Wt 187.4 lb

## 2012-07-11 DIAGNOSIS — J449 Chronic obstructive pulmonary disease, unspecified: Secondary | ICD-10-CM

## 2012-07-11 MED ORDER — MOMETASONE FURO-FORMOTEROL FUM 200-5 MCG/ACT IN AERO: 2.0000 | INHALATION_SPRAY | Freq: Two times a day (BID) | RESPIRATORY_TRACT | Status: DC

## 2012-07-11 NOTE — Progress Notes (Signed)
[name] is a 79 year old man with a history of moderate COPD, allergic rhinitis, coronary artery disease (patent grafts on last cath, [name])   [name] 04/19/09 -- returns for regular f/u. Now on cozaar and tolerating well. Breathing well, no flares or hospitalizations. Remains active, does get some sob with prolonged activity. Doing stationary bike. Hasn't restarted tobacco.   ROV 12/06/09 -- follows up for acute-on-chronic dyspnea in setting COPD, CAD. He saw [name] in 04/2009 for less severe symptoms, vein grafts were patent and PAOP was 18. He now is having SOB with simply walking to mailbox. Was previously doing exercise bike. He has had improvement in breathing in past with lasix (he uses as needed for LE edema). Remains on Spiriva and Symbicort + Proventil. No wheeze, no cough, no mucous, no CP. He has had panting and pre-syncope.   ROV 12/10/09 -- returns for f/u of his progressive SOB. His cards eval has been reassuring. CXR shows emphysema, some kyphosis, no infiltrates or pulm edema. Of note, he has gained 30 lbs since beginning of the yr![name]   ROV 12/22/10 -- COPD, Hx CAD, exertional dyspnea. Returns for annual f/u. He has been doing fairly well, no exacerbations since last visit. Still with some exertional SOB. He has been having problems with voiding at night, made better by stopping his night dose of Symbicort. He uses albuterol rarely, about 1x a week.   ROV 12/25/11 -- COPD, Hx CAD, exertional dyspnea. Returns for annual f/u. He has lost some wt, about 20 lbs.  He is off Spiriva and Symbicort and does not miss them. He uses albuterol almost every day. He was on pred once in last week, it was for a rxn to metformin.  ROV 07/11/12 --  COPD, Hx CAD, exertional dyspnea. He tells me today that he is more SOB, difficulty getting to the mailbox. He is not currently on BD's - had problems with urinary retention.     Filed Vitals:   07/11/12 1531  BP: 136/62  Pulse: 71   Gen: Pleasant,  well-nourished, in no distress,  normal affect  ENT: No lesions,  mouth clear,  oropharynx clear, no postnasal drip  Neck: No JVD, no TMG, no carotid bruits  Lungs: Distant, very slight wheeze  Cardiovascular: RRR, heart sounds normal, no murmur or gallops, no peripheral edema  Musculoskeletal: No deformities, no cyanosis or clubbing  Neuro: alert, non focal   COPD - will restart BD's > try Dulera since he had side effects with symbicort and Spiriva.

## 2012-07-11 NOTE — Patient Instructions (Addendum)
We will start Dulera 2 puffs twice a day Rinse your mouth out after you take this medication.  Follow with Dr Delton Coombes in 6 weeks or sooner if you have any problems

## 2012-07-11 NOTE — Assessment & Plan Note (Signed)
-   will restart BD's > try Dulera since he had side effects with symbicort and Spiriva.

## 2012-07-17 ENCOUNTER — Other Ambulatory Visit: Payer: Self-pay | Admitting: *Deleted

## 2012-07-17 MED ORDER — FUROSEMIDE 40 MG PO TABS: 40.0000 mg | ORAL_TABLET | Freq: Every day | ORAL | Status: DC

## 2012-08-03 ENCOUNTER — Emergency Department (HOSPITAL_COMMUNITY)
Admission: EM | Admit: 2012-08-03 | Discharge: 2012-08-03 | Disposition: A | Discharge status: Home / Self Care | Payer: Medicare Other | Attending: Emergency Medicine | Admitting: Emergency Medicine

## 2012-08-03 ENCOUNTER — Emergency Department (HOSPITAL_COMMUNITY): Payer: Medicare Other

## 2012-08-03 ENCOUNTER — Encounter (HOSPITAL_COMMUNITY): Payer: Self-pay | Admitting: Emergency Medicine

## 2012-08-03 DIAGNOSIS — E782 Mixed hyperlipidemia: Secondary | ICD-10-CM | POA: Insufficient documentation

## 2012-08-03 DIAGNOSIS — M1990 Unspecified osteoarthritis, unspecified site: Secondary | ICD-10-CM | POA: Insufficient documentation

## 2012-08-03 DIAGNOSIS — Z7982 Long term (current) use of aspirin: Secondary | ICD-10-CM | POA: Insufficient documentation

## 2012-08-03 DIAGNOSIS — IMO0002 Reserved for concepts with insufficient information to code with codable children: Secondary | ICD-10-CM | POA: Insufficient documentation

## 2012-08-03 DIAGNOSIS — J441 Chronic obstructive pulmonary disease with (acute) exacerbation: Secondary | ICD-10-CM | POA: Insufficient documentation

## 2012-08-03 DIAGNOSIS — N189 Chronic kidney disease, unspecified: Secondary | ICD-10-CM | POA: Insufficient documentation

## 2012-08-03 DIAGNOSIS — Z87448 Personal history of other diseases of urinary system: Secondary | ICD-10-CM | POA: Insufficient documentation

## 2012-08-03 DIAGNOSIS — E7800 Pure hypercholesterolemia, unspecified: Secondary | ICD-10-CM | POA: Insufficient documentation

## 2012-08-03 DIAGNOSIS — Z8739 Personal history of other diseases of the musculoskeletal system and connective tissue: Secondary | ICD-10-CM | POA: Insufficient documentation

## 2012-08-03 DIAGNOSIS — F0390 Unspecified dementia without behavioral disturbance: Secondary | ICD-10-CM | POA: Insufficient documentation

## 2012-08-03 DIAGNOSIS — Z87891 Personal history of nicotine dependence: Secondary | ICD-10-CM | POA: Insufficient documentation

## 2012-08-03 DIAGNOSIS — E119 Type 2 diabetes mellitus without complications: Secondary | ICD-10-CM | POA: Insufficient documentation

## 2012-08-03 DIAGNOSIS — I5032 Chronic diastolic (congestive) heart failure: Secondary | ICD-10-CM | POA: Insufficient documentation

## 2012-08-03 DIAGNOSIS — H9190 Unspecified hearing loss, unspecified ear: Secondary | ICD-10-CM | POA: Insufficient documentation

## 2012-08-03 DIAGNOSIS — I2510 Atherosclerotic heart disease of native coronary artery without angina pectoris: Secondary | ICD-10-CM | POA: Insufficient documentation

## 2012-08-03 DIAGNOSIS — R079 Chest pain, unspecified: Secondary | ICD-10-CM | POA: Insufficient documentation

## 2012-08-03 DIAGNOSIS — G4730 Sleep apnea, unspecified: Secondary | ICD-10-CM | POA: Insufficient documentation

## 2012-08-03 DIAGNOSIS — R091 Pleurisy: Secondary | ICD-10-CM | POA: Insufficient documentation

## 2012-08-03 DIAGNOSIS — I129 Hypertensive chronic kidney disease with stage 1 through stage 4 chronic kidney disease, or unspecified chronic kidney disease: Secondary | ICD-10-CM | POA: Insufficient documentation

## 2012-08-03 DIAGNOSIS — Z79899 Other long term (current) drug therapy: Secondary | ICD-10-CM | POA: Insufficient documentation

## 2012-08-03 LAB — CBC WITH DIFFERENTIAL/PLATELET
Basophils Relative: 0 % (ref 0–1)
Eosinophils Absolute: 0.2 10*3/uL (ref 0.0–0.7)
HCT: 38.3 % — ABNORMAL LOW (ref 39.0–52.0)
Hemoglobin: 12.9 g/dL — ABNORMAL LOW (ref 13.0–17.0)
Lymphs Abs: 1.7 10*3/uL (ref 0.7–4.0)
MCH: 31 pg (ref 26.0–34.0)
MCHC: 33.7 g/dL (ref 30.0–36.0)
Monocytes Absolute: 0.8 10*3/uL (ref 0.1–1.0)
Monocytes Relative: 9 % (ref 3–12)
Neutro Abs: 6.6 10*3/uL (ref 1.7–7.7)
RBC: 4.16 MIL/uL — ABNORMAL LOW (ref 4.22–5.81)

## 2012-08-03 LAB — BASIC METABOLIC PANEL
BUN: 43 mg/dL — ABNORMAL HIGH (ref 6–23)
Chloride: 101 mEq/L (ref 96–112)
Creatinine, Ser: 1.44 mg/dL — ABNORMAL HIGH (ref 0.50–1.35)
GFR calc Af Amer: 51 mL/min — ABNORMAL LOW (ref >90)
Glucose, Bld: 213 mg/dL — ABNORMAL HIGH (ref 70–99)

## 2012-08-03 LAB — POCT I-STAT TROPONIN I: Troponin i, poc: 0 ng/mL (ref 0.00–0.08)

## 2012-08-03 MED ORDER — NAPROXEN 500 MG PO TABS: 500.0000 mg | ORAL_TABLET | Freq: Two times a day (BID) | ORAL | Status: DC

## 2012-08-03 MED ORDER — NAPROXEN 250 MG PO TABS
500.0000 mg | ORAL_TABLET | Freq: Once | ORAL | Status: AC
  Administered 2012-08-03: 500 mg via ORAL
  Filled 2012-08-03: qty 2

## 2012-08-03 NOTE — ED Notes (Signed)
Pt. Stated,I started having chest pain yesterday, and when I take a deep breath its worse.

## 2012-08-03 NOTE — ED Provider Notes (Signed)
History     CSN: [id number]  Arrival date & time 08/03/12  8295   First MD Initiated Contact with Patient 08/03/12 1107      Chief Complaint  Patient presents with  . Chest Pain    (Consider location/radiation/quality/duration/timing/severity/associated sxs/prior treatment) HPI Comments: 79 year old male with a history of diabetes, hypertension, coronary disease status post stenting, angioplasty and coronary bypass grafting in the past who presents with approximately 18 hours of pain in the left chest laterally and inferiorly described as a sharp and stabbing pain, worse with taking a deep breath and overall persistent though it waxes and wanes in intensity. This came on after exerting himself in the garden yesterday but is not similar to the pain that he had with his prior obstructions. This does not seem to be positional, it does get worse with taking a deep breath, it is not associated with fevers or coughing though he does have COPD and has a chronic cough. He admits to persistent right lower extremity edema which is chronic and related to the grafting of the veins from his right leg for his coronary graft.   Cardiologist is Dr. Shirlee Latch  The patient takes a baby aspirin daily, Lasix daily  Patient is a 79 y.o. male presenting with chest pain. The history is provided by the patient, the spouse and medical records.  Chest Pain   Past Medical History  Diagnosis Date  . Coronary artery disease     s/p CABG 2006. LHC (1/10): SVG-OM and PLOM, SVG-D, and LIMA-LAD were all patent  . Hypertension   . COPD (chronic obstructive pulmonary disease)     moderate. followed by Dr.Byrum  . Chronic diastolic heart failure     8/08 ECHO with EF 55%  . Edema     localized. suspect diastolic CHF + venous insufficiency  . Mixed hyperlipidemia   . Sleep apnea     ?  . Diabetes mellitus   . History of tobacco abuse   . Allergic rhinitis   . Hypercholesterolemia   . Benign prostatic hypertrophy    . Osteoarthritis   . Dementia     (mild)--06/2006  . Hearing loss     left >>right  . CKD (chronic kidney disease)   . Echocardiogram abnormal 07/2006    EF 55%, mild LAE    Past Surgical History  Procedure Laterality Date  . Angioplasty  [id number]  . Rotator cuff repair  2005    left, Gio[place]  . Coronary artery bypass graft  02/2005    Family History  Problem Relation Age of Onset  . Heart attack Mother   . Lung cancer Sister     History  Substance Use Topics  . Smoking status: Former Smoker -- 3.00 packs/day for 60 years    Types: Cigarettes    Quit date: 07/12/2005  . Smokeless tobacco: Never Used  . Alcohol Use: No      Review of Systems  Cardiovascular: Positive for chest pain.  All other systems reviewed and are negative.    Allergies  Tramadol hcl; Ace inhibitors; Atorvastatin; Codeine; Morphine sulfate; Ticlopidine hcl; and Warfarin sodium  Home Medications   Current Outpatient Rx  Name  Route  Sig  Dispense  Refill  . aspirin 81 MG EC tablet   Oral   Take 81 mg by mouth daily.           [name] doxazosin (CARDURA) 4 MG tablet   Oral   Take 1 tablet (4 mg total)  by mouth at bedtime. 1-2 tablets daily as directed   180 tablet   1   . furosemide (LASIX) 40 MG tablet   Oral   Take 1 tablet (40 mg total) by mouth daily.   30 tablet   2     Patient needs to call for appointment to see Cardi ...   . furosemide (LASIX) 40 MG tablet   Oral   Take 20-40 mg by mouth daily. 40 mg if a lot of swelling 20 mg if no swelling         . glipiZIDE (GLUCOTROL XL) 10 MG 24 hr tablet   Oral   Take 1 tablet (10 mg total) by mouth daily.   90 tablet   3   . levalbuterol (XOPENEX HFA) 45 MCG/ACT inhaler   Inhalation   Inhale 1-2 puffs into the lungs every 4 (four) hours as needed.   1 Inhaler   5   . losartan (COZAAR) 50 MG tablet   Oral   Take 0.5 tablets (25 mg total) by mouth daily.   30 tablet   6   . metFORMIN (GLUCOPHAGE) 500 MG tablet    Oral   Take 1 tablet (500 mg total) by mouth 2 (two) times daily with a meal.   60 tablet   11   . mometasone-formoterol (DULERA) 200-5 MCG/ACT AERO   Inhalation   Inhale 2 puffs into the lungs 2 (two) times daily.   1 Inhaler   0   . nebivolol (BYSTOLIC) 5 MG tablet   Oral   Take 1 tablet (5 mg total) by mouth daily.   30 tablet   11   . simvastatin (ZOCOR) 40 MG tablet   Oral   Take 1 tablet (40 mg total) by mouth at bedtime.   90 tablet   3   . glucose blood test strip   Other   1 each by Other route 2 (two) times daily.         . naproxen (NAPROSYN) 500 MG tablet   Oral   Take 1 tablet (500 mg total) by mouth 2 (two) times daily with a meal.   30 tablet   0     BP 138/97  Pulse 63  Temp(Src) 97.7 F (36.5 C) (Oral)  Resp 16  SpO2 97%  Physical Exam  Nursing note and vitals reviewed. Constitutional: He appears well-developed and well-nourished. No distress.  HENT:  Head: Normocephalic and atraumatic.  Mouth/Throat: Oropharynx is clear and moist. No oropharyngeal exudate.  Eyes: Conjunctivae and EOM are normal. Pupils are equal, round, and reactive to light. Right eye exhibits no discharge. Left eye exhibits no discharge. No scleral icterus.  Neck: Normal range of motion. Neck supple. No JVD present. No thyromegaly present.  Cardiovascular: Normal rate, regular rhythm, normal heart sounds and intact distal pulses.  Exam reveals no gallop and no friction rub.   No murmur heard. Pulmonary/Chest: Effort normal. No respiratory distress. He has wheezes. He has no rales.  No increased work of breathing, speaks in full sentences, mild end expiratory wheezing  Abdominal: Soft. Bowel sounds are normal. He exhibits no distension and no mass. There is no tenderness.  Musculoskeletal: Normal range of motion. He exhibits edema ( Unilateral mild asymmetry to the left lower extremity greater than the right lower extremity with mild edema). He exhibits no tenderness.   Lymphadenopathy:    He has no cervical adenopathy.  Neurological: He is alert. Coordination normal.  Skin: Skin is  warm and dry. No rash noted. No erythema.  Psychiatric: He has a normal mood and affect. His behavior is normal.    ED Course  Procedures (including critical care time)  Labs Reviewed  CBC WITH DIFFERENTIAL - Abnormal; Notable for the following:    RBC 4.16 (*)    Hemoglobin 12.9 (*)    HCT 38.3 (*)    All other components within normal limits  BASIC METABOLIC PANEL - Abnormal; Notable for the following:    Glucose, Bld 213 (*)    BUN 43 (*)    Creatinine, Ser 1.44 (*)    GFR calc non Af Amer 44 (*)    GFR calc Af Amer 51 (*)    All other components within normal limits  TROPONIN I  POCT I-STAT TROPONIN I   Dg Chest 2 View  08/03/2012  *RADIOLOGY REPORT*  Clinical Data: Left-sided chest pain, shortness of breath, pain with deep inspiration, history of COPD  CHEST - 2 VIEW  Comparison: 05/27/2002; 05/20/2010  Findings: Grossly unchanged cardiac silhouette and mediastinal contours with atherosclerotic calcifications within the thoracic aorta.  Post median sternotomy and CABG.  The lungs remain hyperinflated with flattening of bilateral hemidiaphragms and mild diffuse thickening of the pulmonary interstitium.  Previously questioned nodular opacity overlying the right costophrenic angle is not appreciated on today's examination.  No focal airspace opacities.  No pleural effusion or pneumothorax.  Unchanged bones.  IMPRESSION: Hyperexpanded lungs and bronchitic change without acute cardiopulmonary disease.   Original Report Authenticated By: Tacey Ruiz, MD      1. Chest pain   2. Pleurisy       MDM   patient has chest pain which is new and different than prior chest pain, appears to be somewhat pleuritic. According to laboratory data his creatinine is at baseline, mild hyperglycemic, normal white blood cell count and no significant anemia.  PA and lateral views of  the chest were obtained by digital radiography. I have personally interpreted these x-rays and find her to be no signs of pulmonary infiltrate, cardiomegaly, subdiaphragmatic free air, soft tissue abnormality, no obvious bony abnormalities or fractures.  There is some hyperexpansion   ED ECG REPORT  I personally interpreted this EKG   Date: 08/03/2012   Rate: 73  Rhythm: normal sinus rhythm  QRS Axis: left  Intervals: normal  ST/T Wave abnormalities: nonspecific ST/T changes  Conduction Disutrbances:right bundle branch block and left anterior fascicular block  Narrative Interpretation:   Old EKG Reviewed: Compared with 08/18/2010, no significant changes are seen   11:40 AM Dr. Daleen Squibb with cardiology aware of pt - suggests NSAIDs and 2nd set with f/u in office.    Non anginal pains, pleuritic, not same as prior.    PT has repeat trop which is normal at 4 hours and the ECG is unchanged.  Stable for d/c.  ED ECG REPORT  I personally interpreted this EKG   Date: 08/03/2012   Rate: 62  Rhythm: normal sinus rhythm  QRS Axis: left  Intervals: PR prolonged  ST/T Wave abnormalities: nonspecific ST/T changes  Conduction Disutrbances:right bundle branch block and left anterior fascicular block  Narrative Interpretation:   Old EKG Reviewed: unchanged     Vida Roller, MD 08/03/12 1506

## 2012-08-03 NOTE — ED Notes (Signed)
MD at bedside. 

## 2012-08-13 ENCOUNTER — Encounter: Payer: Medicare Other | Admitting: Physician Assistant

## 2012-08-22 ENCOUNTER — Ambulatory Visit (INDEPENDENT_AMBULATORY_CARE_PROVIDER_SITE_OTHER): Payer: Medicare Other | Admitting: Emergency Medicine

## 2012-08-22 ENCOUNTER — Encounter: Payer: Self-pay | Admitting: Emergency Medicine

## 2012-08-22 VITALS — BP 120/60 | HR 71 | Temp 97.4°F | Ht 67.0 in | Wt 192.8 lb

## 2012-08-22 DIAGNOSIS — J449 Chronic obstructive pulmonary disease, unspecified: Secondary | ICD-10-CM

## 2012-08-22 NOTE — Assessment & Plan Note (Addendum)
He did not benefit from [facility], wants to go back to Symbicort. He likely needs an a-agent as well. Weiil try to add back symbicort and spiriva in sequence so we can watch for side effects.  Will need repeat spirometry. Walking oximetry has been reassuring. Suspect at ;least a component of his dyspnea is wt gain - will consider pulm rehab

## 2012-08-22 NOTE — Patient Instructions (Addendum)
Please stop dulera Go back to Symbicort 2 puffs twice a day. Rinse your mouth after using Call if you develop side effects Follow with Dr Delton Coombes in 6 weeks or sooner if you have any problems

## 2012-08-22 NOTE — Progress Notes (Signed)
Timothy Cordova is a 79 year old man with a history of moderate COPD, allergic rhinitis, coronary artery disease (patent grafts on last cath, [name])   Timothy Cordova 04/19/09 -- returns for regular f/u. Now on cozaar and tolerating well. Breathing well, no flares or hospitalizations. Remains active, does get some sob with prolonged activity. Doing stationary bike. Hasn't restarted tobacco.   ROV 12/06/09 -- follows up for acute-on-chronic dyspnea in setting COPD, CAD. He saw [name] in 04/2009 for less severe symptoms, vein grafts were patent and PAOP was 18. He now is having SOB with simply walking to mailbox. Was previously doing exercise bike. He has had improvement in breathing in past with lasix (he uses as needed for LE edema). Remains on Spiriva and Symbicort + Proventil. No wheeze, no cough, no mucous, no CP. He has had panting and pre-syncope.   ROV 12/10/09 -- returns for f/u of his progressive SOB. His cards eval has been reassuring. CXR shows emphysema, some kyphosis, no infiltrates or pulm edema. Of note, he has gained 30 lbs since beginning of the yr![name]   ROV 12/22/10 -- COPD, Hx CAD, exertional dyspnea. Returns for annual f/u. He has been doing fairly well, no exacerbations since last visit. Still with some exertional SOB. He has been having problems with voiding at night, made better by stopping his night dose of Symbicort. He uses albuterol rarely, about 1x a week.   ROV 12/25/11 -- COPD, Hx CAD, exertional dyspnea. Returns for annual f/u. He has lost some wt, about 20 lbs.  He is off Spiriva and Symbicort and does not miss them. He uses albuterol almost every day. He was on pred once in last week, it was for a rxn to metformin.  ROV 07/11/12 --  COPD, Hx CAD, exertional dyspnea. He tells me today that he is more SOB, difficulty getting to the mailbox. He is not currently on BD's - had problems with urinary retention.    ROV 08/22/12 -- moderate COPD (severe AFL), allergic rhinitis. He has been off and  on BD's because at times his breathing has not been limiting and also due to side effects of urinary retention. Last time we tried [facility] because he couldn't tolerate Symbicort or Spiriva  >> he doesn't believe it has helped. He went to the ED 2 weeks ago with inspiratory L CP > better after naprosyn.  He has put back on the 20 lbs that he lost back in 2012. He has mucous, throat noise, throat clearing - a relatively new issue since pollen season.   FEV1 1.08 (55%) in 05/2007.      Filed Vitals:   08/22/12 1354  BP: 120/60  Pulse: 71  Temp: 97.4 F (36.3 C)   Gen: Pleasant, well-nourished, in no distress,  normal affect  ENT: No lesions,  mouth clear,  oropharynx clear, no postnasal drip  Neck: No JVD, no TMG, no carotid bruits  Lungs: Distant, very slight wheeze  Cardiovascular: RRR, heart sounds normal, no murmur or gallops, no peripheral edema  Musculoskeletal: No deformities, no cyanosis or clubbing  Neuro: alert, non focal   COPD He did not benefit from [facility], wants to go back to Symbicort. He likely needs an a-agent as well. Weiil try to add back symbicort and spiriva in sequence so we can watch for side effects.  Will need repeat spirometry. Walking oximetry has been reassuring. Suspect at ;least a component of his dyspnea is wt gain - will consider pulm rehab

## 2012-08-30 ENCOUNTER — Encounter: Payer: Medicare Other | Admitting: Physician Assistant

## 2012-09-19 ENCOUNTER — Encounter: Payer: Self-pay | Admitting: Emergency Medicine

## 2012-09-19 ENCOUNTER — Ambulatory Visit (INDEPENDENT_AMBULATORY_CARE_PROVIDER_SITE_OTHER): Payer: Medicare Other | Admitting: Emergency Medicine

## 2012-09-19 VITALS — BP 112/58 | HR 60 | Temp 97.5°F | Ht 67.0 in | Wt 190.4 lb

## 2012-09-19 DIAGNOSIS — J4489 Other specified chronic obstructive pulmonary disease: Secondary | ICD-10-CM

## 2012-09-19 DIAGNOSIS — J449 Chronic obstructive pulmonary disease, unspecified: Secondary | ICD-10-CM

## 2012-09-19 MED ORDER — BUDESONIDE-FORMOTEROL FUMARATE 160-4.5 MCG/ACT IN AERO: 2.0000 | INHALATION_SPRAY | Freq: Two times a day (BID) | RESPIRATORY_TRACT | Status: DC

## 2012-09-19 MED ORDER — ALBUTEROL SULFATE HFA 108 (90 BASE) MCG/ACT IN AERS: 2.0000 | INHALATION_SPRAY | Freq: Four times a day (QID) | RESPIRATORY_TRACT | Status: DC | PRN

## 2012-09-19 NOTE — Patient Instructions (Addendum)
Please continue your Symbicort twice a day Rinse your mouth out after using. Use albuterol 2 puffs if needed for shortness of breath (or about 15 minutes before your exercise routine).  Follow with Dr Delton Coombes in 6 months or sooner if you have any problems

## 2012-09-19 NOTE — Assessment & Plan Note (Signed)
-   continue Symbicort - defer ambulatory oximetry at this time at his request - rov 6 mon

## 2012-09-19 NOTE — Addendum Note (Signed)
Addended by: Orma Flaming D on: 09/19/2012 02:44 PM   Modules accepted: Orders

## 2012-09-19 NOTE — Progress Notes (Signed)
Timothy Cordova is a 79 year old man with a history of moderate COPD, allergic rhinitis, coronary artery disease (patent grafts on last cath, Timothy Cordova)   Timothy Cordova 04/19/09 -- returns for regular f/u. Now on cozaar and tolerating well. Breathing well, no flares or hospitalizations. Remains active, does get some sob with prolonged activity. Doing stationary bike. Hasn't restarted tobacco.   ROV 12/06/09 -- follows up for acute-on-chronic dyspnea in setting COPD, CAD. He saw Timothy Cordova in 04/2009 for less severe symptoms, vein grafts were patent and PAOP was 18. He now is having SOB with simply walking to mailbox. Was previously doing exercise bike. He has had improvement in breathing in past with lasix (he uses as needed for LE edema). Remains on Spiriva and Symbicort + Proventil. No wheeze, no cough, no mucous, no CP. He has had panting and pre-syncope.   ROV 12/10/09 -- returns for f/u of his progressive SOB. His cards eval has been reassuring. CXR shows emphysema, some kyphosis, no infiltrates or pulm edema. Of note, he has gained 30 lbs since beginning of the yr![name]   ROV 12/22/10 -- COPD, Hx CAD, exertional dyspnea. Returns for annual f/u. He has been doing fairly well, no exacerbations since last visit. Still with some exertional SOB. He has been having problems with voiding at night, made better by stopping his night dose of Symbicort. He uses albuterol rarely, about 1x a week.   ROV 12/25/11 -- COPD, Hx CAD, exertional dyspnea. Returns for annual f/u. He has lost some wt, about 20 lbs.  He is off Spiriva and Symbicort and does not miss them. He uses albuterol almost every day. He was on pred once in last week, it was for a rxn to metformin.  ROV 07/11/12 --  COPD, Hx CAD, exertional dyspnea. He tells me today that he is more SOB, difficulty getting to the mailbox. He is not currently on BD's - had problems with urinary retention.    ROV 08/22/12 -- moderate COPD (severe AFL), allergic rhinitis. He has been off and  on BD's because at times his breathing has not been limiting and also due to side effects of urinary retention. Last time we tried [facility] because he couldn't tolerate Symbicort or Spiriva  >> he doesn't believe it has helped. He went to the ED 2 weeks ago with inspiratory L CP > better after naprosyn.  He has put back on the 20 lbs that he lost back in 2012. He has mucous, throat noise, throat clearing - a relatively new issue since pollen season.   FEV1 1.08 (55%) in 05/2007.   ROV 09/19/12 -- moderate COPD (severe AFL), allergic rhinitis. I saw him 1 mo ago, tried going back to Symbicort [facility] didn';t seem to help him). Returns today and reports that he is tolerating better, hasn';t had any urinary retention so far. He doesn't seem to think that it has changed his breathing very much.      Filed Vitals:   09/19/12 1408  BP: 112/58  Pulse: 60  Temp: 97.5 F (36.4 C)   Gen: Pleasant, well-nourished, in no distress,  normal affect  ENT: No lesions,  mouth clear,  oropharynx clear, no postnasal drip  Neck: No JVD, no TMG, no carotid bruits  Lungs: Distant, focal R exp basilar wheeze  Cardiovascular: RRR, heart sounds normal, no murmur or gallops, no peripheral edema  Musculoskeletal: No deformities, no cyanosis or clubbing  Neuro: alert, non focal   COPD - continue Symbicort - defer ambulatory oximetry  at this time at his request - rov 6 mon

## 2012-10-10 ENCOUNTER — Other Ambulatory Visit: Payer: Medicare Other

## 2012-10-14 ENCOUNTER — Other Ambulatory Visit: Payer: Self-pay | Admitting: Family Medicine

## 2012-10-14 DIAGNOSIS — E785 Hyperlipidemia, unspecified: Secondary | ICD-10-CM

## 2012-10-14 DIAGNOSIS — E1059 Type 1 diabetes mellitus with other circulatory complications: Secondary | ICD-10-CM

## 2012-10-14 DIAGNOSIS — Z79899 Other long term (current) drug therapy: Secondary | ICD-10-CM

## 2012-10-17 ENCOUNTER — Other Ambulatory Visit: Payer: Self-pay

## 2012-10-18 ENCOUNTER — Encounter: Payer: Self-pay | Admitting: Family Medicine

## 2012-10-18 ENCOUNTER — Ambulatory Visit (INDEPENDENT_AMBULATORY_CARE_PROVIDER_SITE_OTHER): Payer: Medicare Other | Admitting: Family Medicine

## 2012-10-18 VITALS — BP 130/60 | HR 75 | Temp 97.6°F | Ht 67.0 in | Wt 189.5 lb

## 2012-10-18 DIAGNOSIS — I25810 Atherosclerosis of coronary artery bypass graft(s) without angina pectoris: Secondary | ICD-10-CM

## 2012-10-18 DIAGNOSIS — E119 Type 2 diabetes mellitus without complications: Secondary | ICD-10-CM

## 2012-10-18 DIAGNOSIS — E782 Mixed hyperlipidemia: Secondary | ICD-10-CM

## 2012-10-18 DIAGNOSIS — I5032 Chronic diastolic (congestive) heart failure: Secondary | ICD-10-CM

## 2012-10-18 DIAGNOSIS — J449 Chronic obstructive pulmonary disease, unspecified: Secondary | ICD-10-CM

## 2012-10-18 NOTE — Progress Notes (Signed)
Nature conservation officer at [facility] [address] [place] [address] Phone: [phone] Fax: [phone]  Date:  10/18/2012   Name:  Timothy Cordova   DOB:  08-01-1928   MRN:  [id number] Gender: male Age: 79 y.o.  Primary Physician:  Timothy Beat, MD  Evaluating MD: Timothy Beat, MD   Chief Complaint: Annual Exam   History of Present Illness:  Timothy Cordova is a 79 y.o. pleasant patient who presents with the following:  Follow-up: In early June, thought was having a heart attack, and that was so dehydrated, and took some naproxen. Question to follow-up and see about chronic dehydration.   CAD / CABG: Known coronary disease and status post CABG. He had been a long-time patient of Dr. Juanda Cordova. Since he retired, he has had some difficulty getting in the cardiology office in [place], and he expresses some frustration about that. It is actually been almost 2 years since his last cardiology appointment. He denies any chest pain or shortness of breath. He did have some chest pain earlier where he went to the hospital several months ago. Ultimately this was felt to be chest wall pain. He took some Naprosyn and this improved.  Naproxen.  DM: He reports his blood sugar is normal at home. We have not done an A1c in more than a year.  HTN: Stable and at goal currently. Tolerating all his medications.  COPD has been more stable after his calm tolerant to the Symbicort.  His back is improved. He overall feels well.     Patient Active Problem List   Diagnosis Date Noted  . Chronic low back pain 04/18/2011  . Renal lesion 03/27/2011  . CKD (chronic kidney disease) 08/18/2010  . OSTEOARTHRITIS 12/06/2009  . DIASTOLIC HEART FAILURE, CHRONIC 08/18/2009  . MIXED HYPERLIPIDEMIA 06/11/2008  . CAD, AUTOLOGOUS BYPASS GRAFT 06/07/2008  . FATIGUE 12/17/2007  . DIABETES MELLITUS, TYPE II 09/04/2007  . DIZZINESS 09/04/2007  . ALLERGIC RHINITIS 01/30/2007  . TOBACCO  ABUSE, HX OF 01/30/2007  . DEMENTIA 07/06/2006  . HEARING LOSS 07/06/2006  . HYPERTENSION 07/06/2006  . BENIGN PROSTATIC HYPERTROPHY 07/06/2006  . COPD 08/08/2005    Past Medical History  Diagnosis Date  . Coronary artery disease     s/p CABG 2006. LHC (1/10): SVG-OM and PLOM, SVG-D, and LIMA-LAD were all patent  . Hypertension   . COPD (chronic obstructive pulmonary disease)     moderate. followed by Dr.Byrum  . Chronic diastolic heart failure     8/08 ECHO with EF 55%  . Edema     localized. suspect diastolic CHF + venous insufficiency  . Mixed hyperlipidemia   . Sleep apnea     ?  . Diabetes mellitus   . History of tobacco abuse   . Allergic rhinitis   . Hypercholesterolemia   . Benign prostatic hypertrophy   . Osteoarthritis   . Dementia     (mild)--06/2006  . Hearing loss     left >>right  . CKD (chronic kidney disease)   . Echocardiogram abnormal 07/2006    EF 55%, mild LAE    Past Surgical History  Procedure Laterality Date  . Angioplasty  [id number]  . Rotator cuff repair  2005    left, Gioffre  . Coronary artery bypass graft  02/2005    History   Social History  . Marital Status: Married    Spouse Name: N/A    Number of Children: N/A  . Years of Education: N/A  Occupational History  . rubber fabrication     RETIRED   Social History Main Topics  . Smoking status: Former Smoker -- 3.00 packs/day for 60 years    Types: Cigarettes    Quit date: 07/12/2005  . Smokeless tobacco: Never Used  . Alcohol Use: No  . Drug Use: No  . Sexually Active: Not on file   Other Topics Concern  . Not on file   Social History Narrative  . No narrative on file    Family History  Problem Relation Age of Onset  . Heart attack Mother   . Lung cancer Sister     Allergies  Allergen Reactions  . Tramadol Hcl     Tremor vs seizure activity  . Ace Inhibitors     REACTION: cough  . Atorvastatin     REACTION: weakness  . Codeine Nausea And Vomiting  .  Morphine Sulfate     REACTION: unspecified  . Ticlopidine Hcl     REACTION: unspecified  . Warfarin Sodium Other (See Comments)    Nausea and vomiting     Medication list has been reviewed and updated.  Outpatient Prescriptions Prior to Visit  Medication Sig Dispense Refill  . albuterol (PROVENTIL HFA;VENTOLIN HFA) 108 (90 BASE) MCG/ACT inhaler Inhale 2 puffs into the lungs every 6 (six) hours as needed for wheezing.  1 Inhaler  6  . aspirin 81 MG EC tablet Take 81 mg by mouth daily.        . budesonide-formoterol (SYMBICORT) 160-4.5 MCG/ACT inhaler Inhale 2 puffs into the lungs 2 (two) times daily.  1 Inhaler  6  . doxazosin (CARDURA) 4 MG tablet Take 1 tablet (4 mg total) by mouth at bedtime. 1-2 tablets daily as directed  180 tablet  1  . furosemide (LASIX) 40 MG tablet Take 20-40 mg by mouth daily. 40 mg if a lot of swelling 20 mg if no swelling      . glipiZIDE (GLUCOTROL XL) 10 MG 24 hr tablet Take 1 tablet (10 mg total) by mouth daily.  90 tablet  3  . losartan (COZAAR) 50 MG tablet Take 0.5 tablets (25 mg total) by mouth daily.  30 tablet  6  . metFORMIN (GLUCOPHAGE) 500 MG tablet Take 1 tablet (500 mg total) by mouth 2 (two) times daily with a meal.  60 tablet  11  . nebivolol (BYSTOLIC) 5 MG tablet Take 1 tablet (5 mg total) by mouth daily.  30 tablet  11  . simvastatin (ZOCOR) 40 MG tablet Take 1 tablet (40 mg total) by mouth at bedtime.  90 tablet  3  . levalbuterol (XOPENEX HFA) 45 MCG/ACT inhaler Inhale 1-2 puffs into the lungs every 4 (four) hours as needed.  1 Inhaler  5   No facility-administered medications prior to visit.    Review of Systems:  No chest pain. Overall he feels pretty well. No nausea or vomiting. He is eating well.  Physical Examination: BP 130/60  Pulse 75  Temp(Src) 97.6 F (36.4 C) (Oral)  Ht 5\' 7"  (1.702 m)  Wt 189 lb 8 oz (85.957 kg)  BMI 29.67 kg/m2  SpO2 95%  Ideal Body Weight: Weight in (lb) to have BMI = 25: 159.3   GEN: WDWN,  NAD, Non-toxic, A & O x 3 HEENT: Atraumatic, Normocephalic. Neck supple. No masses, No LAD. Ears and Nose: No external deformity. CV: RRR, No M/G/R. No JVD. No thrill. No extra heart sounds. PULM: CTA B, no wheezes, crackles, rhonchi.  No retractions. No resp. distress. No accessory muscle use. EXTR: No c/c/e NEURO Normal gait.  PSYCH: Normally interactive. Conversant. Not depressed or anxious appearing.  Calm demeanor.    Assessment and Plan:  CAD, AUTOLOGOUS BYPASS GRAFT - Plan: Ambulatory referral to Cardiology  DIASTOLIC HEART FAILURE, CHRONIC - Plan: Ambulatory referral to Cardiology  DIABETES MELLITUS, TYPE II  Mixed hyperlipidemia  COPD  Overall doing well. Check laboratories today.  No alteration of the medications.  I did try to get him set up into the cardiology clinic in [place].  Followup 6 months.  Orders Today:  Orders Placed This Encounter  Procedures  . Ambulatory referral to Cardiology    Referral Priority:  Routine    Referral Type:  Consultation    Referral Reason:  Specialty Services Required    Requested Specialty:  Cardiology    Number of Visits Requested:  1    Updated Medication List: (Includes new medications, updates to list, dose adjustments) No orders of the defined types were placed in this encounter.    Medications Discontinued: Medications Discontinued During This Encounter  Medication Reason  . levalbuterol [facility] HFA) 45 MCG/ACT inhaler Error      Signed, Ladarien Beeks T. Anokhi Shannon, MD 10/18/2012 3:09 PM

## 2012-10-18 NOTE — Patient Instructions (Addendum)
REFERRAL: GO THE THE FRONT ROOM AT THE ENTRANCE OF OUR CLINIC, NEAR CHECK IN. ASK FOR Timothy Cordova. SHE WILL HELP YOU SET UP YOUR REFERRAL. DATE: TIME:  

## 2012-11-05 ENCOUNTER — Ambulatory Visit (INDEPENDENT_AMBULATORY_CARE_PROVIDER_SITE_OTHER): Payer: Medicare Other | Admitting: Cardiovascular Disease

## 2012-11-05 ENCOUNTER — Encounter: Payer: Self-pay | Admitting: Cardiovascular Disease

## 2012-11-05 VITALS — BP 122/68 | HR 72 | Ht 67.5 in | Wt 189.8 lb

## 2012-11-05 DIAGNOSIS — I25810 Atherosclerosis of coronary artery bypass graft(s) without angina pectoris: Secondary | ICD-10-CM

## 2012-11-05 DIAGNOSIS — J449 Chronic obstructive pulmonary disease, unspecified: Secondary | ICD-10-CM

## 2012-11-05 DIAGNOSIS — E782 Mixed hyperlipidemia: Secondary | ICD-10-CM

## 2012-11-05 DIAGNOSIS — I10 Essential (primary) hypertension: Secondary | ICD-10-CM

## 2012-11-05 DIAGNOSIS — E119 Type 2 diabetes mellitus without complications: Secondary | ICD-10-CM

## 2012-11-05 DIAGNOSIS — I5032 Chronic diastolic (congestive) heart failure: Secondary | ICD-10-CM

## 2012-11-05 DIAGNOSIS — Z87891 Personal history of nicotine dependence: Secondary | ICD-10-CM

## 2012-11-05 DIAGNOSIS — N189 Chronic kidney disease, unspecified: Secondary | ICD-10-CM

## 2012-11-05 NOTE — Assessment & Plan Note (Signed)
Currently with no symptoms of angina. No further workup at this time. Continue current medication regimen. Encouraged him to call us if shortness of breath gets worse, or if he has additional episodes of chest pain

## 2012-11-05 NOTE — Assessment & Plan Note (Signed)
Cholesterol is at goal on the current lipid regimen. No changes to the medications were made.  

## 2012-11-05 NOTE — Assessment & Plan Note (Signed)
Significant shortness of breath with exertion. Started discussing with him whether he might benefit from oxygen at nighttime and possibly with exertion. He would like to think about this.

## 2012-11-05 NOTE — Assessment & Plan Note (Signed)
Very encouraging that his hemoglobin A1c has improved over the past 5 years. Last check in 2013, hemoglobin A1c 6.6

## 2012-11-05 NOTE — Assessment & Plan Note (Signed)
Suspect part of his underlying renal dysfunction is from overdiuresis. He has been on Lasix for symptom relief for shortness of breath. Most of his shortness of breath is likely from COPD

## 2012-11-05 NOTE — Assessment & Plan Note (Signed)
Blood pressure is well controlled on today's visit. No changes made to the medications. 

## 2012-11-05 NOTE — Patient Instructions (Addendum)
You are doing well. Continue lasix 1/2 pill daily  Please call us if you have new issues that need to be addressed before your next appt.  Your physician wants you to follow-up in: 6 months.  You will receive a reminder letter in the mail two months in advance. If you don't receive a letter, please call our office to schedule the follow-up appointment.

## 2012-11-05 NOTE — Progress Notes (Signed)
Patient ID: Timothy Cordova, male    DOB: 1928/06/06, 79 y.o.   MRN: [id number]  HPI Comments: 79 yo with history of CAD s/p CABG in 2006, history of diastolic CHF, severe COPD with 50+ year smoking history , diabetes ,  presenting for routine cardiology evaluation.  CORONARY ARTERY DISEASE: s/p CABG 2006.  LHC (1/10): SVG-RCA, SVG-OM and PLOM, SVG-D, and LIMA-LAD were all patent.    Previous records and cardiac catheterization were reviewed   He was last seen in 2012. At that time Lasix was restarted for shortness of breath. He reports that he is unable to walk short distances without stopping. He has chronic low-grade cough. No significant edema of the left lower extremity, trace edema of the right lower extremity  (vein donor leg ).   He denies any significant change to his shortness of breath in the past several years, no chest pain.  He gardens frequently during the week, tries to ride his bike 15-20 minutes daily. No resistance on the bike. Lab work from April 2014 shows creatinine 1.4, BUN 43  He reports having evaluation in the hospital April 2014 for chest pain. negative cardiac enzymes No further chest pain since that time    No claudication-type pain in his legs. Weight has been relatively stable around 285-290 pounds  ECG: Shows normal sinus rhythm with rate 72 beats per minute, bigeminal pattern, right bundle branch block  Allergies (verified):  1)  ! Coumadin 2)  ! Codeine 3)  ! Ace Inhibitors 4)  Lipitor (Atorvastatin Calcium) 5)  Ticlid 6)  Morphine Sulfate (Morphine Sulfate)   Family History: Dad died@46 --accident Mom died @54 --MI 3 brothers--1 suicide 6 sisters--1 died lung cancer No HTN,DM No prostate or colon cancer   Social History: Retired--rubber Data processing manager Lives in [place] son Former Smoker--long time then quit 2006 Alcohol use-no Enjoys golf and yard work   Past Medical History: 1. CORONARY ARTERY DISEASE: s/p CABG 2006.  LHC  (1/10): SVG-RCA, SVG-OM and PLOM, SVG-D, and LIMA-LAD were all patent.   2. HYPERTENSION   3. COPD: Moderate.  Followed by Dr. Delton Coombes.   4. DIASTOLIC HEART FAILURE, CHRONIC: 8/08 echo with EF 55%.   5. EDEMA- LOCALIZED. Suspect diastolic CHF + venous insufficiency.   6. MIXED HYPERLIPIDEMIA 7. ? of SLEEP APNEA 8. DIABETES MELLITUS, TYPE II   9. TOBACCO ABUSE, HX OF   10. ALLERGIC RHINITIS   11. HYPERCHOLESTEROLEMIA   12. BENIGN PROSTATIC HYPERTROPHY   13. OSTEOARTHRITIS   14. Dementia (mild)--3/08 15. Hearing loss L>>R 16. CKD with mild hyperkalemia 17. Ruptured disc L-spine with low back pain.    Outpatient Encounter Prescriptions as of 11/05/2012  Medication Sig Dispense Refill  . albuterol (PROVENTIL HFA;VENTOLIN HFA) 108 (90 BASE) MCG/ACT inhaler Inhale 2 puffs into the lungs every 6 (six) hours as needed for wheezing.  1 Inhaler  6  . aspirin 81 MG EC tablet Take 81 mg by mouth daily.        . budesonide-formoterol (SYMBICORT) 160-4.5 MCG/ACT inhaler Inhale 2 puffs into the lungs 2 (two) times daily.  1 Inhaler  6  . doxazosin (CARDURA) 4 MG tablet Take 1 tablet (4 mg total) by mouth at bedtime. 1-2 tablets daily as directed  180 tablet  1  . furosemide (LASIX) 40 MG tablet Take 20-40 mg by mouth daily. 40 mg if a lot of swelling 20 mg if no swelling      . glipiZIDE (GLUCOTROL XL) 10 MG 24 hr tablet  Take 1 tablet (10 mg total) by mouth daily.  90 tablet  3  . losartan (COZAAR) 50 MG tablet Take 0.5 tablets (25 mg total) by mouth daily.  30 tablet  6  . metFORMIN (GLUCOPHAGE) 500 MG tablet Take 1 tablet (500 mg total) by mouth 2 (two) times daily with a meal.  60 tablet  11  . nebivolol (BYSTOLIC) 5 MG tablet Take 1 tablet (5 mg total) by mouth daily.  30 tablet  11  . simvastatin (ZOCOR) 40 MG tablet Take 1 tablet (40 mg total) by mouth at bedtime.  90 tablet  3    Review of Systems  Constitutional: Negative.   HENT: Negative.   Eyes: Negative.   Respiratory: Positive for  cough and shortness of breath.   Cardiovascular: Negative.   Gastrointestinal: Negative.   Musculoskeletal: Negative.   Skin: Negative.   Neurological: Negative.   Psychiatric/Behavioral: Negative.   All other systems reviewed and are negative.    BP 122/68  Pulse 72  Ht 5' 7.5" (1.715 m)  Wt 189 lb 12 oz (86.07 kg)  BMI 29.26 kg/m2  Physical Exam  Nursing note and vitals reviewed. Constitutional: He is oriented to person, place, and time. He appears well-developed and well-nourished.  HENT:  Head: Normocephalic.  Nose: Nose normal.  Mouth/Throat: Oropharynx is clear and moist.  Eyes: Conjunctivae are normal. Pupils are equal, round, and reactive to light.  Neck: Normal range of motion. Neck supple. No JVD present.  Cardiovascular: Normal rate, regular rhythm, S1 normal, S2 normal, normal heart sounds and intact distal pulses.  Exam reveals no gallop and no friction rub.   No murmur heard. Pulmonary/Chest: Effort normal. No respiratory distress. He has decreased breath sounds. He has no wheezes. He has no rales. He exhibits no tenderness.  Abdominal: Soft. Bowel sounds are normal. He exhibits no distension. There is no tenderness.  Musculoskeletal: Normal range of motion. He exhibits no edema and no tenderness.  Lymphadenopathy:    He has no cervical adenopathy.  Neurological: He is alert and oriented to person, place, and time. Coordination normal.  Skin: Skin is warm and dry. No rash noted. No erythema.  Psychiatric: He has a normal mood and affect. His behavior is normal. Judgment and thought content normal.      Assessment and Plan

## 2012-11-05 NOTE — Assessment & Plan Note (Signed)
Suggested he stay on Lasix 20 mrem daily. We did discuss taking Lasix every other day. He can try this given his renal dysfunction and dehydration on lab work in April 2014. If shortness of breath gets worse, he may have to increase Lasix back to daily for symptom relief.

## 2012-11-05 NOTE — Assessment & Plan Note (Signed)
50+ years of smoking. Stopped 8 years ago

## 2012-11-25 ENCOUNTER — Other Ambulatory Visit: Payer: Self-pay | Admitting: Family Medicine

## 2012-11-25 NOTE — Telephone Encounter (Signed)
Received refill request electronically. See allergy/contraindiction. Is it okay to refill medication? 

## 2012-12-18 ENCOUNTER — Ambulatory Visit (INDEPENDENT_AMBULATORY_CARE_PROVIDER_SITE_OTHER): Payer: Medicare Other | Admitting: Family Medicine

## 2012-12-18 ENCOUNTER — Ambulatory Visit (INDEPENDENT_AMBULATORY_CARE_PROVIDER_SITE_OTHER)
Admission: RE | Admit: 2012-12-18 | Discharge: 2012-12-18 | Disposition: A | Discharge status: Home / Self Care | Payer: Medicare Other | Source: Ambulatory Visit | Attending: Family Medicine | Admitting: Family Medicine

## 2012-12-18 ENCOUNTER — Encounter: Payer: Self-pay | Admitting: Family Medicine

## 2012-12-18 VITALS — BP 130/80 | HR 66 | Temp 97.9°F | Ht 67.0 in | Wt 191.5 lb

## 2012-12-18 DIAGNOSIS — I25810 Atherosclerosis of coronary artery bypass graft(s) without angina pectoris: Secondary | ICD-10-CM

## 2012-12-18 DIAGNOSIS — I5032 Chronic diastolic (congestive) heart failure: Secondary | ICD-10-CM

## 2012-12-18 DIAGNOSIS — R0602 Shortness of breath: Secondary | ICD-10-CM

## 2012-12-18 DIAGNOSIS — R079 Chest pain, unspecified: Secondary | ICD-10-CM

## 2012-12-18 DIAGNOSIS — J449 Chronic obstructive pulmonary disease, unspecified: Secondary | ICD-10-CM

## 2012-12-18 DIAGNOSIS — E119 Type 2 diabetes mellitus without complications: Secondary | ICD-10-CM

## 2012-12-18 MED ORDER — SAXAGLIPTIN HCL 2.5 MG PO TABS: 2.5000 mg | ORAL_TABLET | Freq: Every day | ORAL | Status: DC

## 2012-12-18 NOTE — Progress Notes (Signed)
Nature conservation officer at [facility] [address] [place] [address] Phone: [phone] Fax: [phone]  Date:  12/18/2012   Name:  Timothy Cordova   DOB:  1929-04-03   MRN:  [id number] Gender: male Age: 79 y.o.  Primary Physician:  Hannah Beat, MD  Evaluating MD: Hannah Beat, MD   Chief Complaint: Reaction to Metformin   History of Present Illness:  Timothy Cordova is a 79 y.o. pleasant patient who presents with the following:  Patient known very well with history of CAD s/p CABG, significant COPD followed by  Dr. Delton Coombes, well-controlled DM, diastolic heart failure some baseline CKD who was admitted to hospital in 09/2012 with negative enzymes with chest pain and felt to have chest wall pain.   Over the last week, he developed some chest pain and d/c his lasix on his own. Now, he is having more shortness of breath, also. Pulse ox is 95%. Wife notes he is getting around much worse. No cough, no fever.   His primary reason for coming to office is presumed medication reaction on metformin, but wife is notably concerned about above - rash started about a week and a half ago, last Thursday, was working outside and that aftrenoon. Put some chigger go on and then took a shower. Then it did not help. New ones did start to spread up and get most all over the place. When he stopped his metformin it resolved.  Patient Active Problem List   Diagnosis Date Noted  . Chronic low back pain 04/18/2011  . Renal lesion 03/27/2011  . CKD (chronic kidney disease) 08/18/2010  . OSTEOARTHRITIS 12/06/2009  . DIASTOLIC HEART FAILURE, CHRONIC 08/18/2009  . MIXED HYPERLIPIDEMIA 06/11/2008  . CAD, AUTOLOGOUS BYPASS GRAFT 06/07/2008  . FATIGUE 12/17/2007  . DIABETES MELLITUS, TYPE II 09/04/2007  . DIZZINESS 09/04/2007  . ALLERGIC RHINITIS 01/30/2007  . TOBACCO ABUSE, HX OF 01/30/2007  . DEMENTIA 07/06/2006  . HEARING LOSS 07/06/2006  . HYPERTENSION 07/06/2006  . BENIGN PROSTATIC  HYPERTROPHY 07/06/2006  . COPD 08/08/2005    Past Medical History  Diagnosis Date  . Coronary artery disease     s/p CABG 2006. LHC (1/10): SVG-OM and PLOM, SVG-D, and LIMA-LAD were all patent  . Hypertension   . COPD (chronic obstructive pulmonary disease)     moderate. followed by Dr.Byrum  . Chronic diastolic heart failure     8/08 ECHO with EF 55%  . Edema     localized. suspect diastolic CHF + venous insufficiency  . Mixed hyperlipidemia   . Sleep apnea     ?  . Diabetes mellitus   . History of tobacco abuse   . Allergic rhinitis   . Hypercholesterolemia   . Benign prostatic hypertrophy   . Osteoarthritis   . Dementia     (mild)--06/2006  . Hearing loss     left >>right  . CKD (chronic kidney disease)   . Echocardiogram abnormal 07/2006    EF 55%, mild LAE    Past Surgical History  Procedure Laterality Date  . Angioplasty  [id number]  . Rotator cuff repair  2005    left, Gioffre  . Coronary artery bypass graft  02/2005  . Cardiac catheterization  90s    [place] x1stent  . Cardiac catheterization      [facility];x2 stents    History   Social History  . Marital Status: Married    Spouse Name: N/A    Number of Children: N/A  .  Years of Education: N/A   Occupational History  . rubber fabrication     RETIRED   Social History Main Topics  . Smoking status: Former Smoker -- 3.00 packs/day for 60 years    Types: Cigarettes    Quit date: 07/12/2005  . Smokeless tobacco: Never Used  . Alcohol Use: No  . Drug Use: No  . Sexual Activity: Not on file   Other Topics Concern  . Not on file   Social History Narrative  . No narrative on file    Family History  Problem Relation Age of Onset  . Heart attack Mother   . Lung cancer Sister     Allergies  Allergen Reactions  . Tramadol Hcl     Tremor vs seizure activity  . Ace Inhibitors     REACTION: cough  . Atorvastatin     REACTION: weakness  . Codeine Nausea And Vomiting  . Morphine Sulfate      REACTION: unspecified  . Ticlopidine Hcl     REACTION: unspecified  . Warfarin Sodium Other (See Comments)    Nausea and vomiting     Medication list has been reviewed and updated.  Outpatient Prescriptions Prior to Visit  Medication Sig Dispense Refill  . albuterol (PROVENTIL HFA;VENTOLIN HFA) 108 (90 BASE) MCG/ACT inhaler Inhale 2 puffs into the lungs every 6 (six) hours as needed for wheezing.  1 Inhaler  6  . aspirin 81 MG EC tablet Take 81 mg by mouth daily.        . budesonide-formoterol (SYMBICORT) 160-4.5 MCG/ACT inhaler Inhale 2 puffs into the lungs 2 (two) times daily.  1 Inhaler  6  . glipiZIDE (GLUCOTROL XL) 10 MG 24 hr tablet Take 1 tablet (10 mg total) by mouth daily.  90 tablet  3  . losartan (COZAAR) 50 MG tablet Take 0.5 tablets (25 mg total) by mouth daily.  30 tablet  6  . nebivolol (BYSTOLIC) 5 MG tablet Take 1 tablet (5 mg total) by mouth daily.  30 tablet  11  . simvastatin (ZOCOR) 40 MG tablet TAKE ONE TABLET BY MOUTH EVERY NIGHT AT BEDTIME  30 tablet  2  . doxazosin (CARDURA) 4 MG tablet Take 1 tablet (4 mg total) by mouth at bedtime. 1-2 tablets daily as directed  180 tablet  1  . furosemide (LASIX) 40 MG tablet Take 20-40 mg by mouth daily. 40 mg if a lot of swelling 20 mg if no swelling      . metFORMIN (GLUCOPHAGE) 500 MG tablet Take 1 tablet (500 mg total) by mouth 2 (two) times daily with a meal.  60 tablet  11   No facility-administered medications prior to visit.    Review of Systems:  No fever. + dyspnea. + chest pain. No cough above baseline. SOB with minimal exertion.   Physical Examination: BP 130/80  Pulse 66  Temp(Src) 97.9 F (36.6 C) (Oral)  Ht 5\' 7"  (1.702 m)  Wt 191 lb 8 oz (86.864 kg)  BMI 29.99 kg/m2  SpO2 95%  Ideal Body Weight: Weight in (lb) to have BMI = 25: 159.3   GEN: WDWN, NAD, Non-toxic, A & O x 3 HEENT: Atraumatic, Normocephalic. Neck supple. No masses, No LAD. Ears and Nose: No external deformity. CV: RRR, No M/G/R.    PULM: scant wheezes. Crackles in bilateral lung bases. EXTR: 1+ le edema NEURO Normal gait.  PSYCH: Normally interactive. Conversant. Not depressed or anxious appearing.  Calm demeanor.    Dg Chest  2 View  12/18/2012   CLINICAL DATA:  Shortness of breath for months, COPD, ex-smoker  EXAM: CHEST  2 VIEW  COMPARISON:  None.  FINDINGS: Cardiomediastinal silhouette is stable. No acute infiltrate or pulmonary edema. Hyperinflation again noted. Status post CABG. Osteopenia and mild degenerative changes thoracic spine.  IMPRESSION: No active cardiopulmonary disease. Hyperinflation again noted. Status post CABG.   Electronically Signed   By: Natasha Mead   On: 12/18/2012 13:40    Assessment and Plan:  Shortness of breath - Plan: DG Chest 2 View  CAD, AUTOLOGOUS BYPASS GRAFT  DIASTOLIC HEART FAILURE, CHRONIC  COPD  DIABETES MELLITUS, TYPE II  Chest pain  >40 minutes spent in face to face time with patient, >50% spent in counselling or coordination of care: Currently I think he is volume overloaded from stopping his lasix, which is contributing to his sob and crackles on exam. CXR is reassuring. No obvious neoplasm or pneumonia.   Altering his DM regiment and stopping metformin. Start onglyza - now limiting both glipizide and onglyza based on gfr.   I am concerned about his chest pain in a complex cardiac patient s/p CABG - normally he does not have chest pain, and he has had 2 episodes now in the last few months and worsening shortness of breath. I called and discussed with Dr. Mariah Milling who has kindly agreed to see the patient.   Orders Today:  Orders Placed This Encounter  Procedures  . DG Chest 2 View    Standing Status: Future     Number of Occurrences: 1     Standing Expiration Date: 02/17/2014    Order Specific Question:  Preferred imaging location?    Answer:  [facility]    Order Specific Question:  Reason for exam:    Answer:  sob, pulm edema?    Updated Medication  List: (Includes new medications, updates to list, dose adjustments) Meds ordered this encounter  Medications  . doxazosin (CARDURA) 4 MG tablet    Sig: Take 4 mg by mouth at bedtime.  . saxagliptin HCl (ONGLYZA) 2.5 MG TABS tablet    Sig: Take 1 tablet (2.5 mg total) by mouth daily.    Dispense:  30 tablet    Refill:  11    Medications Discontinued: Medications Discontinued During This Encounter  Medication Reason  . doxazosin (CARDURA) 4 MG tablet   . metFORMIN (GLUCOPHAGE) 500 MG tablet       Signed, Delisha Peaden T. Braylei Totino, MD 12/18/2012 11:59 AM

## 2012-12-23 ENCOUNTER — Ambulatory Visit (INDEPENDENT_AMBULATORY_CARE_PROVIDER_SITE_OTHER): Payer: Medicare Other | Admitting: Cardiovascular Disease

## 2012-12-23 ENCOUNTER — Encounter: Payer: Self-pay | Admitting: Cardiovascular Disease

## 2012-12-23 VITALS — BP 140/70 | HR 76 | Ht 67.0 in | Wt 190.0 lb

## 2012-12-23 DIAGNOSIS — I10 Essential (primary) hypertension: Secondary | ICD-10-CM

## 2012-12-23 DIAGNOSIS — I5032 Chronic diastolic (congestive) heart failure: Secondary | ICD-10-CM

## 2012-12-23 DIAGNOSIS — I25810 Atherosclerosis of coronary artery bypass graft(s) without angina pectoris: Secondary | ICD-10-CM

## 2012-12-23 DIAGNOSIS — R079 Chest pain, unspecified: Secondary | ICD-10-CM

## 2012-12-23 DIAGNOSIS — R0602 Shortness of breath: Secondary | ICD-10-CM

## 2012-12-23 DIAGNOSIS — E119 Type 2 diabetes mellitus without complications: Secondary | ICD-10-CM

## 2012-12-23 DIAGNOSIS — E782 Mixed hyperlipidemia: Secondary | ICD-10-CM

## 2012-12-23 NOTE — Patient Instructions (Addendum)
You are doing well. No medication changes were made.  Please call the office for worsening shortness of breath or chest pain We would do a cardiac cath (or stress)  Please call us if you have new issues that need to be addressed before your next appt.  Your physician wants you to follow-up in: 6 months.  You will receive a reminder letter in the mail two months in advance. If you don't receive a letter, please call our office to schedule the follow-up appointment.

## 2012-12-23 NOTE — Assessment & Plan Note (Signed)
Blood pressure is well controlled on today's visit. No changes made to the medications. 

## 2012-12-23 NOTE — Assessment & Plan Note (Signed)
Cholesterol is at goal on the current lipid regimen. No changes to the medications were made.  

## 2012-12-23 NOTE — Progress Notes (Signed)
Patient ID: Timothy Cordova, male    DOB: 1928/08/14, 79 y.o.   MRN: [id number]  HPI Comments: 80 yo with history of CAD s/p CABG in 2006, history of diastolic CHF, severe COPD with 50+ year smoking history , diabetes ,  presenting for routine cardiology evaluation.  CORONARY ARTERY DISEASE: s/p CABG 2006.  LHC (1/10): SVG-RCA, SVG-OM and PLOM, SVG-D, and LIMA-LAD were all patent.    He reports that he has chronic shortness of breath. Episodes of chest pain over the past several months when he takes a deep breath. He describes this as a stabbing sensation in his left flank, left pectoral area. Symptoms of chest pain in June of 2014 (?  Suspect he means April)  that took him to the hospital. He was told at that time that he might have a "rubbing" and was told to take naproxen. This pill was held in followup, likely secondary to underlying renal dysfunction.  Previous anginal symptoms and 2006 included chest pain in his sternal area with profound weakness. He feels that recent chest pain is different and not his heart. He is not interested in a stress test at this time He continues on Lasix daily. Denies any lower extremity edema Creatinine has been running 1.4 with elevated BUN likely from taking Lasix  He gardens frequently during the week   No claudication-type pain in his legs. Weight has been relatively stable   ECG: Shows normal sinus rhythm with rate 76 beats per minute, bigeminal pattern, right bundle branch block, left anterior fascicular block  Social History: Retired--rubber Data processing manager Lives in [place] son Former Smoker--long time then quit 2006 Alcohol use-no Enjoys golf and yard work   Past Medical History: 1. CORONARY ARTERY DISEASE: s/p CABG 2006.  LHC (1/10): SVG-RCA, SVG-OM and PLOM, SVG-D, and LIMA-LAD were all patent.   2. HYPERTENSION   3. COPD: Moderate.  Followed by Dr. Delton Coombes.   4. DIASTOLIC HEART FAILURE, CHRONIC: 8/08 echo with EF 55%.   5. EDEMA-  LOCALIZED. Suspect diastolic CHF + venous insufficiency.   6. MIXED HYPERLIPIDEMIA 7. ? of SLEEP APNEA 8. DIABETES MELLITUS, TYPE II   9. TOBACCO ABUSE, HX OF   10. ALLERGIC RHINITIS   11. HYPERCHOLESTEROLEMIA   12. BENIGN PROSTATIC HYPERTROPHY   13. OSTEOARTHRITIS   14. Dementia (mild)--3/08 15. Hearing loss L>>R 16. CKD with mild hyperkalemia 17. Ruptured disc L-spine with low back pain.    Outpatient Encounter Prescriptions as of 12/23/2012  Medication Sig Dispense Refill  . albuterol (PROVENTIL HFA;VENTOLIN HFA) 108 (90 BASE) MCG/ACT inhaler Inhale 2 puffs into the lungs every 6 (six) hours as needed for wheezing.  1 Inhaler  6  . aspirin 81 MG EC tablet Take 81 mg by mouth daily.        . budesonide-formoterol (SYMBICORT) 160-4.5 MCG/ACT inhaler Inhale 2 puffs into the lungs 2 (two) times daily.  1 Inhaler  6  . doxazosin (CARDURA) 4 MG tablet Take 4 mg by mouth at bedtime.      . furosemide (LASIX) 40 MG tablet Take 20-40 mg by mouth daily. 40 mg if a lot of swelling 20 mg if no swelling      . glipiZIDE (GLUCOTROL XL) 10 MG 24 hr tablet Take 1 tablet (10 mg total) by mouth daily.  90 tablet  3  . losartan (COZAAR) 50 MG tablet Take 0.5 tablets (25 mg total) by mouth daily.  30 tablet  6  . nebivolol (BYSTOLIC) 5 MG tablet  Take 1 tablet (5 mg total) by mouth daily.  30 tablet  11  . saxagliptin HCl (ONGLYZA) 2.5 MG TABS tablet Take 1 tablet (2.5 mg total) by mouth daily.  30 tablet  11  . simvastatin (ZOCOR) 40 MG tablet TAKE ONE TABLET BY MOUTH EVERY NIGHT AT BEDTIME  30 tablet  2   No facility-administered encounter medications on file as of 12/23/2012.     Review of Systems  Constitutional: Negative.   HENT: Negative.   Eyes: Negative.   Respiratory: Positive for shortness of breath.   Cardiovascular: Positive for chest pain.  Gastrointestinal: Negative.   Endocrine: Negative.   Musculoskeletal: Negative.   Skin: Negative.   Allergic/Immunologic: Negative.    Neurological: Negative.   Hematological: Negative.   Psychiatric/Behavioral: Negative.   All other systems reviewed and are negative.    BP 140/70  Pulse 76  Ht 5\' 7"  (1.702 m)  Wt 190 lb (86.183 kg)  BMI 29.75 kg/m2  Physical Exam  Nursing note and vitals reviewed. Constitutional: He is oriented to person, place, and time. He appears well-developed and well-nourished.  HENT:  Head: Normocephalic.  Nose: Nose normal.  Mouth/Throat: Oropharynx is clear and moist.  Eyes: Conjunctivae are normal. Pupils are equal, round, and reactive to light.  Neck: Normal range of motion. Neck supple. No JVD present.  Cardiovascular: Normal rate, S1 normal, S2 normal, normal heart sounds and intact distal pulses.  A regularly irregular rhythm present. Exam reveals no gallop and no friction rub.   No murmur heard. Pulmonary/Chest: Effort normal. No respiratory distress. He has decreased breath sounds. He has no wheezes. He has no rales. He exhibits no tenderness.  Abdominal: Soft. Bowel sounds are normal. He exhibits no distension. There is no tenderness.  Musculoskeletal: Normal range of motion. He exhibits no edema and no tenderness.  Lymphadenopathy:    He has no cervical adenopathy.  Neurological: He is alert and oriented to person, place, and time. Coordination normal.  Skin: Skin is warm and dry. No rash noted. No erythema.  Psychiatric: He has a normal mood and affect. His behavior is normal. Judgment and thought content normal.      Assessment and Plan

## 2012-12-23 NOTE — Assessment & Plan Note (Signed)
Chronic shortness of breath and rare episodes of sharp chest pain. He is refusing stress test. He does not feel that it is his heart. There is an atypical component to his chest pain symptoms, more pleuritic or musculoskeletal. We spent a long time talking about his symptoms and what to look for. He does not want testing at this time the we have encouraged him to call us for worsening chest pain symptoms more like his previous anginal symptoms, or shortness of breath. He'll continue on Lasix daily.

## 2012-12-23 NOTE — Assessment & Plan Note (Signed)
Given the extent of his underlying lung disease, his breathing will be better if he is slightly dry. We'll continue Lasix daily.  if creatinine rises well above his baseline creatinine 1.4, Lasix dose could be decreased.

## 2012-12-23 NOTE — Assessment & Plan Note (Signed)
We have encouraged continued exercise, careful diet management in an effort to lose weight. 

## 2012-12-25 ENCOUNTER — Ambulatory Visit: Payer: Medicare Other | Admitting: Cardiovascular Disease

## 2013-01-23 ENCOUNTER — Encounter (INDEPENDENT_AMBULATORY_CARE_PROVIDER_SITE_OTHER): Payer: Medicare Other | Admitting: Ophthalmology

## 2013-01-23 DIAGNOSIS — H3530 Unspecified macular degeneration: Secondary | ICD-10-CM

## 2013-01-23 DIAGNOSIS — E11319 Type 2 diabetes mellitus with unspecified diabetic retinopathy without macular edema: Secondary | ICD-10-CM

## 2013-01-23 DIAGNOSIS — E1139 Type 2 diabetes mellitus with other diabetic ophthalmic complication: Secondary | ICD-10-CM

## 2013-01-23 DIAGNOSIS — E11311 Type 2 diabetes mellitus with unspecified diabetic retinopathy with macular edema: Secondary | ICD-10-CM

## 2013-01-23 DIAGNOSIS — H43819 Vitreous degeneration, unspecified eye: Secondary | ICD-10-CM

## 2013-01-23 DIAGNOSIS — H35039 Hypertensive retinopathy, unspecified eye: Secondary | ICD-10-CM

## 2013-01-23 DIAGNOSIS — I10 Essential (primary) hypertension: Secondary | ICD-10-CM

## 2013-02-06 ENCOUNTER — Other Ambulatory Visit: Payer: Self-pay | Admitting: Family Medicine

## 2013-02-14 ENCOUNTER — Telehealth: Payer: Self-pay | Admitting: Emergency Medicine

## 2013-02-14 MED ORDER — BUDESONIDE-FORMOTEROL FUMARATE 160-4.5 MCG/ACT IN AERO: 2.0000 | INHALATION_SPRAY | Freq: Two times a day (BID) | RESPIRATORY_TRACT | Status: DC

## 2013-02-14 NOTE — Telephone Encounter (Signed)
Samples left at front. Pt spouse aware. Carron Curie, CMA

## 2013-03-10 ENCOUNTER — Other Ambulatory Visit: Payer: Self-pay | Admitting: Family Medicine

## 2013-03-10 NOTE — Telephone Encounter (Signed)
refilled 

## 2013-03-10 NOTE — Telephone Encounter (Signed)
Last office visit 12/18/2012.  Last Lipid Profile 03/15/2011.  Ok to refill?

## 2013-03-13 ENCOUNTER — Other Ambulatory Visit: Payer: Self-pay | Admitting: *Deleted

## 2013-03-13 MED ORDER — FUROSEMIDE 40 MG PO TABS: 20.0000 mg | ORAL_TABLET | Freq: Every day | ORAL | Status: DC

## 2013-03-13 NOTE — Telephone Encounter (Signed)
Requested Prescriptions   Signed Prescriptions Disp Refills  . furosemide (LASIX) 40 MG tablet 30 tablet 3    Sig: Take 0.5-1 tablets (20-40 mg total) by mouth daily. 40 mg if a lot of swelling 20 mg if no swelling    Authorizing Provider: Antonieta Iba    Ordering User: Kendrick Fries

## 2013-04-01 ENCOUNTER — Ambulatory Visit (INDEPENDENT_AMBULATORY_CARE_PROVIDER_SITE_OTHER): Payer: Medicare Other | Admitting: Family Medicine

## 2013-04-01 ENCOUNTER — Encounter: Payer: Self-pay | Admitting: Family Medicine

## 2013-04-01 VITALS — BP 132/82 | HR 95 | Temp 97.6°F | Ht 67.0 in | Wt 189.5 lb

## 2013-04-01 DIAGNOSIS — J449 Chronic obstructive pulmonary disease, unspecified: Secondary | ICD-10-CM

## 2013-04-01 DIAGNOSIS — R509 Fever, unspecified: Secondary | ICD-10-CM

## 2013-04-01 DIAGNOSIS — I700 Atherosclerosis of aorta: Secondary | ICD-10-CM

## 2013-04-01 DIAGNOSIS — G4730 Sleep apnea, unspecified: Secondary | ICD-10-CM

## 2013-04-01 DIAGNOSIS — J111 Influenza due to unidentified influenza virus with other respiratory manifestations: Secondary | ICD-10-CM

## 2013-04-01 DIAGNOSIS — Z87891 Personal history of nicotine dependence: Secondary | ICD-10-CM

## 2013-04-01 MED ORDER — PREDNISONE 20 MG PO TABS: ORAL_TABLET | ORAL | Status: DC

## 2013-04-01 NOTE — Progress Notes (Signed)
Pre-visit discussion using our clinic review tool. No additional management support is needed unless otherwise documented below in the visit note.  

## 2013-04-01 NOTE — Progress Notes (Signed)
Patient Name: Timothy Cordova Date of Birth: 28-Jun-1928 Medical Record Number: [id number] Gender: male  History of Present Illness:  Timothy Cordova presents with runny nose, sneezing, cough, sore throat, malaise, myalgias, arthralgia, chills, and fever. General he is not feeling well, aching all over, had some difficulty breathing, some coughing and congestion. He is here with his son today. His wife is also ill. He is coughing a great deal producing some colored sputum. Maximum temperature is unknown, but they do think that he was running a low fever a few days ago. He is now about 5 or 6 days into his illness. + recent exposure to others with similar symptoms.   The patent denies sore throat as the primary complaint. Denies sthortness of breath/wheezing, otalgia, facial pain, abdominal pain, changes in bowel or bladder.  Generally feels terrible  PMH, PHS, Allergies, Problem List, Medications, Family History, and Social History have all been reviewed.  Review of Systems: as above, eating and drinking - tolerating PO. Urinating normally. No excessive vomitting or diarrhea. O/w as above.  Physical Exam:  Filed Vitals:   04/01/13 1031  BP: 132/82  Pulse: 95  Temp: 97.6 F (36.4 C)  TempSrc: Oral  Height: 5\' 7"  (1.702 m)  Weight: 189 lb 8 oz (85.957 kg)  SpO2: 94%    Gen: WDWN, NAD; A & O x3, cooperative. Pleasant.Globally Non-toxic HEENT: Normocephalic and atraumatic. Throat clear, w/o exudate, R TM clear, L TM - good landmarks, No fluid present. rhinnorhea. No frontal or maxillary sinus T. MMM NECK: Anterior cervical  LAD is absent CV: RRR, No M/G/R, cap refill <2 sec PULM: Breathing comfortably in no respiratory distress. Intermittent wheezing and rhonchi ABD: S,NT,ND,+BS. No HSM. No rebound. EXT: No c/c/e PSYCH: Friendly, good eye contact MSK: Nml gait  Assessment and Plan: 1. Influenza: The patient's clinical exam and history is consistent with a diagnosis of  influenza. In the setting of significant chronic obstructive pulmonary disease, I'm also going to place him on some oral prednisone. I tried to encourage him to take some Tamiflu given high risk, but he is in a doughnut hole now, and he does not have any money and he could spare to pay for it.  Supportive care. Fluids. Cough medicines as needed  Anti-pyretics.  Infection control emphasized, including OOW or school until AF 24 hours.  Meds ordered this encounter  Medications  . ONETOUCH DELICA LANCETS 33G MISC    Sig:   . predniSONE (DELTASONE) 20 MG tablet    Sig: 2 tabs po daily for 5 days, then 1 tabs po daily    Dispense:  15 tablet    Refill:  0    Patient Instructions: INFLUENZA Viral illness: High fever, headache, bad cough, body aches, sore throat, sometimes nausea, vomitting, diarrhea.  Usually quick onset  TREATMENT 1. Decongestant: Sudafed (NOT IF HIGH BLOOD PRESSURE) 2. Nose Sprays: Saline nasal spray, Can use Afrin or Neosynephrine, but no more than 3 days 3. Cough Suppressants: DM portion of cough med, or Strong prescription 4. Expectorant: Liquify Secretions (Guaifenesin) 5. Example: Mucinex (expectorant) or Mucinex-D (expectorant and decongestant) 6. Take all prescribed meds 7. Anti-virals may be used if caught early or in high risk people. (Do NOT if pregnant, breast feeding, seizure disorder) 8. Rest helpful, but move some during day 9. Breathe moist air: Humidifier, Vaporizer, or steam from shower 10. No work or school until no fever for 24 hrs WITH NO Tylenol or Ibuprofen. 11. Pneumonia on top  of Flu is possible, if you are doing poorly particularly if a smoker or have COPD, please let us know. 12. Wash hands and cover mouth with cough   Signed,  Dujuan Stankowski T. Symon Norwood, MD, CAQ Sports Medicine  [facility] [address] [place] [address] Phone: [phone] Fax: [phone]

## 2013-04-03 ENCOUNTER — Encounter: Payer: Self-pay | Admitting: Family Medicine

## 2013-04-03 DIAGNOSIS — I700 Atherosclerosis of aorta: Secondary | ICD-10-CM | POA: Insufficient documentation

## 2013-04-03 DIAGNOSIS — G4730 Sleep apnea, unspecified: Secondary | ICD-10-CM | POA: Insufficient documentation

## 2013-04-03 DIAGNOSIS — Z720 Tobacco use: Secondary | ICD-10-CM | POA: Insufficient documentation

## 2013-04-04 ENCOUNTER — Ambulatory Visit (INDEPENDENT_AMBULATORY_CARE_PROVIDER_SITE_OTHER): Payer: Medicare Other | Admitting: Family Medicine

## 2013-04-04 ENCOUNTER — Encounter: Payer: Self-pay | Admitting: Family Medicine

## 2013-04-04 ENCOUNTER — Telehealth: Payer: Self-pay | Admitting: Family Medicine

## 2013-04-04 VITALS — BP 136/78 | HR 59 | Temp 97.5°F | Ht 66.0 in | Wt 189.5 lb

## 2013-04-04 DIAGNOSIS — R05 Cough: Secondary | ICD-10-CM

## 2013-04-04 DIAGNOSIS — J09X2 Influenza due to identified novel influenza A virus with other respiratory manifestations: Secondary | ICD-10-CM

## 2013-04-04 DIAGNOSIS — J189 Pneumonia, unspecified organism: Secondary | ICD-10-CM

## 2013-04-04 DIAGNOSIS — J449 Chronic obstructive pulmonary disease, unspecified: Secondary | ICD-10-CM

## 2013-04-04 MED ORDER — LEVOFLOXACIN 500 MG PO TABS: 500.0000 mg | ORAL_TABLET | Freq: Every day | ORAL | Status: DC

## 2013-04-04 MED ORDER — METHYLPREDNISOLONE ACETATE 40 MG/ML IJ SUSP
60.0000 mg | Freq: Once | INTRAMUSCULAR | Status: AC
  Administered 2013-04-04: 60 mg via INTRAMUSCULAR

## 2013-04-04 NOTE — Progress Notes (Signed)
Pre-visit discussion using our clinic review tool. No additional management support is needed unless otherwise documented below in the visit note.  

## 2013-04-04 NOTE — Telephone Encounter (Signed)
Instructed the patient to go to the ER or call 911 if he got any worse or felt like he needed to before his 11am appt

## 2013-04-04 NOTE — Telephone Encounter (Signed)
Pt started taking the medicine prescribed on Tuesday.  He is no better.  He wants to know if there is something else that can be prescribed.  Also, he wants to know if he can take Alka-Seltzer.  As we are talking he said he is having trouble getting a good breath.  I told him he should come in.  Making appt. For him now.

## 2013-04-04 NOTE — Telephone Encounter (Signed)
Will eval

## 2013-04-04 NOTE — Progress Notes (Signed)
Patient Name: Timothy Cordova Date of Birth: 10-Sep-1928 Medical Record Number: [id number] Gender: male  History of Present Illness:  The patient is here again with his son, after doing poorly over the last few days. He was diagnosed with influenza, and he does have significant COPD as well as multiple other comorbidities including coronary disease, status post cardiac bypass grafting. I did give initial oral prednisone at the time, thought that he was having some COPD flare as well. He declined Tamiflu at the time of his office visit secondary to financial reasons. He is having more shortness of breath and having some difficulty getting around his house. Pulse ox today is 94% on room air on my check.  Prior OV: General he is not feeling well, aching all over, had some difficulty breathing, some coughing and congestion. He is here with his son today. His wife is also ill. He is coughing a great deal producing some colored sputum. Maximum temperature is unknown, but they do think that he was running a low fever a few days ago. He is now about 5 or 6 days into his illness. + recent exposure to others with similar symptoms.   The patent denies sore throat as the primary complaint. Denies sthortness of breath/wheezing, otalgia, facial pain, abdominal pain, changes in bowel or bladder.  Generally feels terrible  PMH, PHS, Allergies, Problem List, Medications, Family History, and Social History have all been reviewed.  Review of Systems: as above, eating and drinking - tolerating PO. Urinating normally. No excessive vomitting or diarrhea. O/w as above.  Physical Exam:  Filed Vitals:   04/04/13 1103  BP: 136/78  Pulse: 59  Temp: 97.5 F (36.4 C)  TempSrc: Oral  Height: 5\' 6"  (1.676 m)  Weight: 189 lb 8 oz (85.957 kg)  SpO2: 94%    Gen: WDWN, NAD; A & O x3, cooperative. Pleasant.Globally Non-toxic HEENT: Normocephalic and atraumatic. Throat clear, w/o exudate, R TM clear, L TM -  good landmarks, No fluid present. rhinnorhea. No frontal or maxillary sinus T. MMM NECK: Anterior cervical  LAD is absent CV: RRR, No M/G/R, cap refill <2 sec PULM: scattered wheezing and rhonchi throughout ABD: S,NT,ND,+BS. No HSM. No rebound. EXT: No c/c/e PSYCH: Friendly, good eye contact MSK: Nml gait  Assessment and Plan: Influenza due to identified novel influenza A virus with other respiratory manifestations  Cough - Plan: methylPREDNISolone acetate (DEPO-MEDROL) injection 60 mg  Chronic airway obstruction, not elsewhere classified  CAP (community acquired pneumonia)  COPD   I also gave a written script for Tamiflu given risk for him and other family members. IM Depo-medrol 60 mg now Add LVQ to cover superimposed PNA potentially  Son will check BID for now. If worsens, to the ER.  Meds ordered this encounter  Medications  . methylPREDNISolone acetate (DEPO-MEDROL) injection 60 mg    Sig:   . levofloxacin (LEVAQUIN) 500 MG tablet    Sig: Take 1 tablet (500 mg total) by mouth daily.    Dispense:  10 tablet    Refill:  0    Patient Instructions: INFLUENZA Viral illness: High fever, headache, bad cough, body aches, sore throat, sometimes nausea, vomitting, diarrhea.  Usually quick onset  TREATMENT 1. Decongestant: Sudafed (NOT IF HIGH BLOOD PRESSURE) 2. Nose Sprays: Saline nasal spray, Can use Afrin or Neosynephrine, but no more than 3 days 3. Cough Suppressants: DM portion of cough med, or Strong prescription 4. Expectorant: Liquify Secretions (Guaifenesin) 5. Example: Mucinex (expectorant) or Mucinex-D (  expectorant and decongestant) 6. Take all prescribed meds 7. Anti-virals may be used if caught early or in high risk people. (Do NOT if pregnant, breast feeding, seizure disorder) 8. Rest helpful, but move some during day 9. Breathe moist air: Humidifier, Vaporizer, or steam from shower 10. No work or school until no fever for 24 hrs WITH NO Tylenol or  Ibuprofen. 11. Pneumonia on top of Flu is possible, if you are doing poorly particularly if a smoker or have COPD, please let us know. 12. Wash hands and cover mouth with cough   Signed,  Alize Acy T. Yuri Flener, MD, CAQ Sports Medicine  [facility] [address] [place] [address] Phone: [phone] Fax: [phone]

## 2013-04-09 ENCOUNTER — Telehealth: Payer: Self-pay

## 2013-04-09 NOTE — Telephone Encounter (Signed)
Spoke w/ pt's wife.  She reports that pt is recovering from the flu and she feels that his sob is worsening to the point that she is afraid to sleep at night b/c she is afraid he is going to stop breathing.  Reviewed chart w/ Alinda Money, PA who advised that pt is being appropriately treated by primary care and that symptoms seem to be r/t flu and not cardiac.  Advised wife that pt should continue on his meds and rest, as getting him out of the house to come here will expend needed energy. Advised her that if symptoms worsen, do not hesitate to proceed to ED.  She is agreeable to this and will keep scheduled appt w/ Dr. Mariah Milling on 04/14/13.

## 2013-04-09 NOTE — Telephone Encounter (Signed)
Pt wife called and states pt has just gotten over the flu and is having breathing problems. Pt is scheduled for 04/14/2012 to see [name]. Please call

## 2013-04-14 ENCOUNTER — Encounter: Payer: Self-pay | Admitting: Cardiovascular Disease

## 2013-04-14 ENCOUNTER — Ambulatory Visit (INDEPENDENT_AMBULATORY_CARE_PROVIDER_SITE_OTHER): Payer: Medicare HMO | Admitting: Cardiovascular Disease

## 2013-04-14 VITALS — BP 112/60 | HR 74 | Ht 67.5 in | Wt 185.8 lb

## 2013-04-14 DIAGNOSIS — N183 Chronic kidney disease, stage 3 unspecified: Secondary | ICD-10-CM

## 2013-04-14 DIAGNOSIS — N058 Unspecified nephritic syndrome with other morphologic changes: Secondary | ICD-10-CM

## 2013-04-14 DIAGNOSIS — I10 Essential (primary) hypertension: Secondary | ICD-10-CM

## 2013-04-14 DIAGNOSIS — R0602 Shortness of breath: Secondary | ICD-10-CM

## 2013-04-14 DIAGNOSIS — I25810 Atherosclerosis of coronary artery bypass graft(s) without angina pectoris: Secondary | ICD-10-CM

## 2013-04-14 DIAGNOSIS — E1129 Type 2 diabetes mellitus with other diabetic kidney complication: Secondary | ICD-10-CM

## 2013-04-14 DIAGNOSIS — N189 Chronic kidney disease, unspecified: Secondary | ICD-10-CM

## 2013-04-14 DIAGNOSIS — I5032 Chronic diastolic (congestive) heart failure: Secondary | ICD-10-CM

## 2013-04-14 NOTE — Patient Instructions (Signed)
You are doing well. No medication changes were made. We will check your kidney function today  Please call us if you have new issues that need to be addressed before your next appt.  Your physician wants you to follow-up in: 1 month.

## 2013-04-14 NOTE — Assessment & Plan Note (Signed)
We'll check basic metabolic panel today. If creatinine acceptable, proceed with catheterization

## 2013-04-14 NOTE — Assessment & Plan Note (Signed)
Appears relatively euvolemic on today's visit, possibly mildly dehydrated. No edema

## 2013-04-14 NOTE — Assessment & Plan Note (Addendum)
Reports worsening shortness of breath which could be his anginal equivalent. We will check his renal function today to determine if he is a candidate for cardiac catheterization. He has been off his Lasix for several days though still feels dehydrated after recovering from the flu. If renal function continues to be elevated, would encourage several days of aggressive hydration with recheck of his renal function. He does not drink much fluids at baseline.  Risks and benefits of a catheterization were discussed with him.  Percentage of stenosis in each vessel if known: Quadruple bypass, severe three-vessel disease  Ejection fraction from stress test or echo within the past 6 months: Not known  Result of any study/stress testing in the prior 6 months: No recent stress test  Anginal status Including stable, unstable, non-STEMI, or anginal equivalent symptoms: Shortness of breath, likely anginal equivalent  Anginal CCS of 1 through 4: 2  ACS, yes or no: No  NYHA classification if CHF history or in CHF: Class III, chronic diastolic CHF

## 2013-04-14 NOTE — Assessment & Plan Note (Signed)
We have encouraged continued exercise, careful diet management in an effort to lose weight. 

## 2013-04-14 NOTE — Assessment & Plan Note (Signed)
Blood pressure is well controlled on today's visit. No changes made to the medications. 

## 2013-04-14 NOTE — Progress Notes (Signed)
Patient ID: Timothy Cordova, male    DOB: Apr 19, 1928, 80 y.o.   MRN: [id number]  HPI Comments: 80 yo with history of CAD s/p CABG in 2006, history of diastolic CHF, severe COPD with 50+ year smoking history , diabetes ,  presenting for routine cardiology evaluation.  CORONARY ARTERY DISEASE: s/p CABG 2006.  LHC (1/10): SVG-RCA, SVG-OM and PLOM, SVG-D, and LIMA-LAD were all patent.   Mild chronic renal dysfunction likely from overdiuresis  He reports that he has chronic shortness of breath.  Shortness of breath has been even worse over the past 6 months. He reports that if the trend continues, he would not be here in 6 months. He denies any significant chest pain, only extreme exhaustion with any exertion even around the house. Recently diagnosed with the flu and continues to have a cough.  Reports that he was previously active over the summer, gardening, since then has not been very active secondary to shortness of breath. He would prefer a cardiac catheterization over a stress test.  Previous anginal symptoms and 2006 included chest pain in his sternal area with profound weakness.    No claudication-type pain in his legs. 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 CORONARY ARTERY DISEASE: s/p CABG 2006.  LHC (1/10): SVG-RCA, SVG-OM and PLOM, SVG-D, and LIMA-LAD were all patent.   2. HYPERTENSION   3. COPD: Moderate.  Followed by Dr. Delton CoombesByrum.   4. DIASTOLIC HEART FAILURE, CHRONIC: 8/08 echo with EF 55%.   5. EDEMA- LOCALIZED. Suspect diastolic CHF + venous insufficiency.   6. MIXED HYPERLIPIDEMIA 7. ? of SLEEP APNEA 8. DIABETES MELLITUS, TYPE II   9. TOBACCO ABUSE, HX  OF   10. ALLERGIC RHINITIS   11. HYPERCHOLESTEROLEMIA   12. BENIGN PROSTATIC HYPERTROPHY   13. OSTEOARTHRITIS   14. Dementia (mild)--3/08 15. Hearing loss L>>R 16. CKD with mild hyperkalemia 17. Ruptured disc L-spine with low back pain.    Outpatient Encounter Prescriptions as of 04/14/2013  Medication Sig  . albuterol (PROVENTIL HFA;VENTOLIN HFA) 108 (90 BASE) MCG/ACT inhaler Inhale 2 puffs into the lungs every 6 (six) hours as needed for wheezing.  [name]. aspirin 81 MG EC tablet Take 81 mg by mouth daily.    . budesonide-formoterol (SYMBICORT) 160-4.5 MCG/ACT inhaler Inhale 2 puffs into the lungs 2 (two) times daily.  [name]. doxazosin (CARDURA) 4 MG tablet Take 4 mg by mouth at bedtime.  . furosemide (LASIX) 40 MG tablet Take 0.5-1 tablets (20-40 mg total) by mouth daily. 40 mg if a lot of swelling 20 mg if no swelling  . glipiZIDE (GLUCOTROL XL) 10 MG 24 hr tablet Take 1 tablet (10 mg total) by mouth daily.  [name]. losartan (COZAAR) 50 MG tablet Take 0.5 tablets (25 mg total) by mouth daily.  Letta Pate. ONETOUCH DELICA LANCETS 33G MISC   . simvastatin (ZOCOR) 40 MG tablet TAKE ONE TABLET BY MOUTH EVERY NIGHT AT BEDTIME  . [DISCONTINUED] BYSTOLIC 5 MG tablet TAKE ONE (1) TABLET BY MOUTH EVERY DAY  . [DISCONTINUED] levofloxacin (LEVAQUIN) 500 MG tablet Take 1 tablet (500 mg total) by mouth daily.  . [DISCONTINUED] predniSONE (DELTASONE) 20 MG tablet 2 tabs po daily for 5 days, then 1 tabs  po daily  . [DISCONTINUED] saxagliptin HCl (ONGLYZA) 2.5 MG TABS tablet Take 1 tablet (2.5 mg total) by mouth daily.     Review of Systems  Constitutional: Positive for fatigue.  HENT: Negative.   Eyes: Negative.   Respiratory: Positive for cough and shortness of breath.   Gastrointestinal: Negative.   Endocrine: Negative.   Musculoskeletal: Negative.   Skin: Negative.   Allergic/Immunologic: Negative.   Neurological: Positive for weakness.  Hematological: Negative.   Psychiatric/Behavioral: Negative.   All other  systems reviewed and are negative.   BP 112/60  Pulse 74  Ht 5' 7.5" (1.715 m)  Wt 185 lb 12 oz (84.256 kg)  BMI 28.65 kg/m2  Physical Exam  Nursing note and vitals reviewed. Constitutional: He is oriented to person, place, and time. He appears well-developed and well-nourished.  HENT:  Head: Normocephalic.  Nose: Nose normal.  Mouth/Throat: Oropharynx is clear and moist.  Eyes: Conjunctivae are normal. Pupils are equal, round, and reactive to light.  Neck: Normal range of motion. Neck supple. No JVD present.  Cardiovascular: Normal rate, S1 normal, S2 normal, normal heart sounds and intact distal pulses.  A regularly irregular rhythm present. Exam reveals no gallop and no friction rub.   No murmur heard. Pulmonary/Chest: Effort normal. No respiratory distress. He has decreased breath sounds. He has no wheezes. He has no rales. He exhibits no tenderness.  Abdominal: Soft. Bowel sounds are normal. He exhibits no distension. There is no tenderness.  Musculoskeletal: Normal range of motion. He exhibits no edema and no tenderness.  Lymphadenopathy:    He has no cervical adenopathy.  Neurological: He is alert and oriented to person, place, and time. Coordination normal.  Skin: Skin is warm and dry. No rash noted. No erythema.  Psychiatric: He has a normal mood and affect. His behavior is normal. Judgment and thought content normal.      Assessment and Plan

## 2013-04-15 ENCOUNTER — Encounter: Payer: Self-pay | Admitting: Emergency Medicine

## 2013-04-15 ENCOUNTER — Ambulatory Visit (INDEPENDENT_AMBULATORY_CARE_PROVIDER_SITE_OTHER): Payer: Medicare HMO | Admitting: Emergency Medicine

## 2013-04-15 VITALS — BP 130/84 | HR 67 | Ht 67.5 in | Wt 189.0 lb

## 2013-04-15 DIAGNOSIS — J449 Chronic obstructive pulmonary disease, unspecified: Secondary | ICD-10-CM

## 2013-04-15 DIAGNOSIS — R0989 Other specified symptoms and signs involving the circulatory and respiratory systems: Secondary | ICD-10-CM

## 2013-04-15 DIAGNOSIS — R0609 Other forms of dyspnea: Secondary | ICD-10-CM

## 2013-04-15 DIAGNOSIS — R0600 Dyspnea, unspecified: Secondary | ICD-10-CM

## 2013-04-15 LAB — BASIC METABOLIC PANEL
BUN / CREAT RATIO: 18 (ref 10–22)
BUN: 29 mg/dL — AB (ref 8–27)
CALCIUM: 9.1 mg/dL (ref 8.6–10.2)
CHLORIDE: 101 mmol/L (ref 97–108)
CO2: 23 mmol/L (ref 18–29)
CREATININE: 1.62 mg/dL — AB (ref 0.76–1.27)
GFR calc Af Amer: 45 mL/min/{1.73_m2} — ABNORMAL LOW (ref >59)
GFR calc non Af Amer: 39 mL/min/{1.73_m2} — ABNORMAL LOW (ref >59)
GLUCOSE: 94 mg/dL (ref 65–99)
Potassium: 5.7 mmol/L — ABNORMAL HIGH (ref 3.5–5.2)
Sodium: 139 mmol/L (ref 134–144)

## 2013-04-15 NOTE — Assessment & Plan Note (Signed)
Appears to be stable although he is having more exertional SOB without cough, wheeze.  - will continue his symbicort - plan for repeat PFT after his cardiology eval w Dr Mariah MillingGollan - rov 3

## 2013-04-15 NOTE — Progress Notes (Signed)
[name] is a 80 year old man with a history of moderate COPD, allergic rhinitis, coronary artery disease (patent grafts on last cath, [name])   Timothy Cordova 04/19/09 -- returns for regular f/u. Now on cozaar and tolerating well. Breathing well, no flares or hospitalizations. Remains active, does get some sob with prolonged activity. Doing stationary bike. Hasn't restarted tobacco.   ROV 12/06/09 -- follows up for acute-on-chronic dyspnea in setting COPD, CAD. He saw [name] in 04/2009 for less severe symptoms, vein grafts were patent and PAOP was 18. He now is having SOB with simply walking to mailbox. Was previously doing exercise bike. He has had improvement in breathing in past with lasix (he uses as needed for LE edema). Remains on Spiriva and Symbicort + Proventil. No wheeze, no cough, no mucous, no CP. He has had panting and pre-syncope.   ROV 12/10/09 -- returns for f/u of his progressive SOB. His cards eval has been reassuring. CXR shows emphysema, some kyphosis, no infiltrates or pulm edema. Of note, he has gained 30 lbs since beginning of the yr![name]   ROV 12/22/10 -- COPD, Hx CAD, exertional dyspnea. Returns for annual f/u. He has been doing fairly well, no exacerbations since last visit. Still with some exertional SOB. He has been having problems with voiding at night, made better by stopping his night dose of Symbicort. He uses albuterol rarely, about 1x a week.   ROV 12/25/11 -- COPD, Hx CAD, exertional dyspnea. Returns for annual f/u. He has lost some wt, about 20 lbs.  He is off Spiriva and Symbicort and does not miss them. He uses albuterol almost every day. He was on pred once in last week, it was for a rxn to metformin.  ROV 07/11/12 --  COPD, Hx CAD, exertional dyspnea. He tells me today that he is more SOB, difficulty getting to the mailbox. He is not currently on BD's - had problems with urinary retention.    ROV 08/22/12 -- moderate COPD (severe AFL), allergic rhinitis. He has been off and  on BD's because at times his breathing has not been limiting and also due to side effects of urinary retention. Last time we tried [facility] because he couldn't tolerate Symbicort or Spiriva  >> he doesn't believe it has helped. He went to the ED 2 weeks ago with inspiratory L CP > better after naprosyn.  He has put back on the 20 lbs that he lost back in 2012. He has mucous, throat noise, throat clearing - a relatively new issue since pollen season.   FEV1 1.08 (55%) in 05/2007.   ROV 09/19/12 -- moderate COPD (severe AFL), allergic rhinitis. I saw him 1 mo ago, tried going back to Symbicort [facility] didn';t seem to help him). Returns today and reports that he is tolerating better, hasn';t had any urinary retention so far. He doesn't seem to think that it has changed his breathing very much.   ROV 04/16/13 -- moderate COPD (severe AFL), allergic rhinitis. He unfortunately had the flu this fall, was sick for about 3 weeks. He may have been having some decreased exercise tolerance even before this illness. He has a hx of CAD, and is rightly concerned that this could be his anginal equivalent. He sees [name] and is planning for f/u soon. He is tolerating Symbicort, isn't sure that is helps him significantly.     Filed Vitals:   04/15/13 1621  BP: 130/84  Pulse: 67  Height: 5' 7.5" (1.715 m)  Weight: 189  lb (85.73 kg)  SpO2: 96%   Gen: Pleasant, well-nourished, in no distress,  normal affect  ENT: No lesions,  mouth clear,  oropharynx clear, no postnasal drip  Neck: No JVD, no TMG, no carotid bruits  Lungs: Distant, focal R exp basilar wheeze  Cardiovascular: RRR, heart sounds normal, no murmur or gallops, no peripheral edema  Musculoskeletal: No deformities, no cyanosis or clubbing  Neuro: alert, non focal   COPD Appears to be stable although he is having more exertional SOB without cough, wheeze.  - will continue his symbicort - plan for repeat PFT after his cardiology eval w Dr  Mariah MillingGollan - rov 3  Dyspnea Progressive dyspnea. Not clear to me that his COPD is driving the sx in absence cough, wheeze. He has never been sure that he benefited from Symbicort.  - continue current BD's for now - cardiac w/u as planned by Dr Mariah MillingGollan - rov 3

## 2013-04-15 NOTE — Patient Instructions (Signed)
Get youcardiac evaluation as planned by Dr Mariah MillingGollan Continue your symbicort Follow with Dr Delton CoombesByrum in 3 months or sooner if you have any problems. We will discuss possibly repeating your pulmonary function testing depending on your cardiac testing

## 2013-04-15 NOTE — Assessment & Plan Note (Signed)
Progressive dyspnea. Not clear to me that his COPD is driving the sx in absence cough, wheeze. He has never been sure that he benefited from Symbicort.  - continue current BD's for now - cardiac w/u as planned by Dr Mariah MillingGollan - rov 3

## 2013-04-16 ENCOUNTER — Telehealth: Payer: Self-pay | Admitting: Family Medicine

## 2013-04-16 NOTE — Telephone Encounter (Signed)
This is ok

## 2013-04-16 NOTE — Telephone Encounter (Signed)
Patient Information:  Caller Name: Thurston Holenne  Phone: [phone]  Patient: Timothy Cordova, Timothy Cordova  Gender: Male  DOB: 1929/02/19  Age: 80 Years  PCP: Hannah Beatopland, [facility](Family Practice)  Office Follow Up:  Does the office need to follow up with this patient?: Yes  Instructions For The Office: Disposition is 'see today' , no appts available, but pt already has an appt scheduled tomorrow 04/17/13 with Dr Patsy Lageropland per wife.   Wife is fine with waiting till tomorrow but wants to know if that's ok with Dr Patsy Lageropland or  if he has any further suggestions.   Symptoms  Reason For Call & Symptoms: Approximately 3 weeks ago pt was diagnosed with influenza.   Was treated with Tamiflu, two rounds of Steroids, and an antibiotic.  04/15/13 seen by pulmonologist and doing well.   During night had a bad coughing spell x 1-2 hours straight, sounded wet and 'girgly' but did not cough up anything.  Able to sleep after spell, but coughed a lot while sleeping.  04/16/13 coughing continues but not bad, tired from last night, Afebrile.   Decreased appetite/drinking.  Voided this am.   Advised wife of need for appt today, she states pt has an appt tomorrow with Dr Patsy Lageropland and no sooner appt available.  Reviewed Health History In EMR: Yes  Reviewed Medications In EMR: Yes  Reviewed Allergies In EMR: Yes  Reviewed Surgeries / Procedures: Yes  Date of Onset of Symptoms: 03/26/2013  Guideline(s) Used:  Cough  Disposition Per Guideline:   See Today in Office  Reason For Disposition Reached:   Severe coughing spells (e.g., whooping sound after coughing, vomiting after coughing)  Advice Given:  Cough Medicines:  OTC Cough Drops: Cough drops can help a lot, especially for mild coughs. They reduce coughing by soothing your irritated throat and removing that tickle sensation in the back of the throat. Cough drops also have the advantage of portability - you can carry them with you.  Home Remedy - Hard Candy: Hard candy works just as  well as medicine-flavored OTC cough drops. Diabetics should use sugar-free candy.  Home Remedy - Honey: This old home remedy has been shown to help decrease coughing at night. The adult dosage is 2 teaspoons (10 ml) at bedtime. Honey should not be given to infants under one year of age.  Coughing Spasms:  Drink warm fluids. Inhale warm mist (Reason: both relax the airway and loosen up the phlegm).  Suck on cough drops or hard candy to coat the irritated throat.  Prevent Dehydration:  Drink adequate liquids.  This will help soothe an irritated or dry throat and loosen up the phlegm.  Call Back If:  Difficulty breathing  You become worse.  Patient Will Follow Care Advice:  YES

## 2013-04-16 NOTE — Telephone Encounter (Signed)
Timothy Cordova notified it is ok to wait and see Dr. Patsy Lageropland tomorrow at scheduled appointment.

## 2013-04-17 ENCOUNTER — Encounter: Payer: Self-pay | Admitting: Family Medicine

## 2013-04-17 ENCOUNTER — Ambulatory Visit (INDEPENDENT_AMBULATORY_CARE_PROVIDER_SITE_OTHER)
Admission: RE | Admit: 2013-04-17 | Discharge: 2013-04-17 | Disposition: A | Discharge status: Home / Self Care | Payer: Medicare HMO | Source: Ambulatory Visit | Attending: Family Medicine | Admitting: Family Medicine

## 2013-04-17 ENCOUNTER — Ambulatory Visit (INDEPENDENT_AMBULATORY_CARE_PROVIDER_SITE_OTHER): Payer: Medicare HMO | Admitting: Family Medicine

## 2013-04-17 ENCOUNTER — Telehealth: Payer: Self-pay

## 2013-04-17 VITALS — BP 110/80 | HR 82 | Temp 97.5°F | Ht 67.5 in | Wt 193.2 lb

## 2013-04-17 DIAGNOSIS — R0600 Dyspnea, unspecified: Secondary | ICD-10-CM

## 2013-04-17 DIAGNOSIS — J111 Influenza due to unidentified influenza virus with other respiratory manifestations: Secondary | ICD-10-CM

## 2013-04-17 DIAGNOSIS — R0609 Other forms of dyspnea: Secondary | ICD-10-CM

## 2013-04-17 DIAGNOSIS — R0989 Other specified symptoms and signs involving the circulatory and respiratory systems: Secondary | ICD-10-CM

## 2013-04-17 DIAGNOSIS — R0602 Shortness of breath: Secondary | ICD-10-CM

## 2013-04-17 DIAGNOSIS — Z01812 Encounter for preprocedural laboratory examination: Secondary | ICD-10-CM

## 2013-04-17 DIAGNOSIS — J441 Chronic obstructive pulmonary disease with (acute) exacerbation: Secondary | ICD-10-CM

## 2013-04-17 MED ORDER — PREDNISONE 20 MG PO TABS: ORAL_TABLET | ORAL | Status: DC

## 2013-04-17 NOTE — Telephone Encounter (Signed)
Spoke w/ pt's wife.  She reports that pt is currently in Dr. Cyndie Chimeopland's office who recommends that pt wait on cath until after he recovers from the flu. Dr. Mariah MillingGollan spoke w/ Dr. Dallas Schimkeopeland and they concluded that we will tentatively schedule pt for cath on 1/16 w/ the option to reschedule if pt is not feeling better at that time. Will call pt in am to discuss further.

## 2013-04-17 NOTE — Progress Notes (Signed)
Pre-visit discussion using our clinic review tool. No additional management support is needed unless otherwise documented below in the visit note.  

## 2013-04-17 NOTE — Telephone Encounter (Signed)
Renal function borderline Ok to proceed with cath Would aim for next friday the 16th No lasix until then Would cut out citrus as potassium is high, no potassium pills Will need to schedule to come in at 7 am for 8:30 case, start IVF for kidneys on arrival and IVF after case.

## 2013-04-17 NOTE — Telephone Encounter (Signed)
Pt wife called and would like lab results. Please call.

## 2013-04-18 NOTE — Progress Notes (Signed)
Date:  04/17/2013   Name:  Timothy Postinhomas O Mehlhaff   DOB:  1928/07/10   MRN:  [id number] Gender: male Age: 80 y.o.  Primary Physician:  Hannah BeatSpencer Leanda Padmore, MD   Chief Complaint: Cough   Subjective:   History of Present Illness:  Timothy Cordova is a 80 y.o. very pleasant male patient who presents with the following:  Very well known patient with a history of chronic obstructive pulmonary disease, coronary disease, status post coronary artery bypass graft, chronic kidney disease stage III, who initially presented on April 01, 2013 with confirmed influenza. I tried to place him on Tamiflu at that point, but he resisted due to finances, and I gave him some prednisone to take.[name]. Ultimately saw him on April 04, 2013 again, and he was doing more poorly at that point. His lung exam was worsened, but he retained adequate pulse oximetry. That point I started him on oral Levaquin for concern of a secondary pneumonia, gave him a shot of steroids in the office, and extended his prednisone. We also were able to supply him with some Tamiflu.  He and his wife report that he did do a little bit better, but 2 days ago he started to feel worse again and more short of breath. Intermittently, he also has seen cardiology, and there is some concern for potential he worsening ischemia as the source of his worsened shortness of breath over the last 6 months. They hope to do cardiac catheterization soon.  Past Medical History, Surgical History, Social History, Family History, Problem List, Medications, and Allergies have been reviewed and updated if relevant.  Review of Systems:  ROS: GEN: Acute illness details above GI: Tolerating PO intake GU: maintaining adequate hydration and urination Pulm: SOB, Cough Interactive and getting along well at home.  Otherwise, ROS is as per the HPI.    Objective:   Physical Examination: BP 110/80  Pulse 82  Temp(Src) 97.5 F (36.4 C) (Oral)  Ht 5' 7.5" (1.715 m)   Wt 193 lb 4 oz (87.658 kg)  BMI 29.80 kg/m2  SpO2 95%   GEN: A and O x 3. WDWN. NAD.    ENT: Nose clear, ext NML.  No LAD.  No JVD.  TM's clear. Oropharynx clear.  PULM: Normal WOB, no distress. Audible wheezing at rest and to auscultation. Rhonchorous sounds diffusely in the B upper lobes. No focal crackles.  CV: RRR, no M/G/R, No rubs, No JVD.   EXT: warm and well-perfused, No c/c/e. PSYCH: Pleasant and conversant.   Laboratory and Imaging Data: Dg Chest 2 View  04/17/2013   CLINICAL DATA:  Shortness of breath, cough, hypertension, COPD, diabetes, history smoking  EXAM: CHEST  2 VIEW  COMPARISON:  12/18/2012  FINDINGS: Upper normal heart size post CABG.  Atherosclerotic calcification aorta.  Mediastinal contours and pulmonary vascularity normal.  Peribronchial thickening and slight hyperinflation consistent with COPD.  No acute infiltrate, pleural effusion or pneumothorax.  Bones demineralized.  IMPRESSION: Post CABG with suspected COPD changes.  No acute abnormalities.   Electronically Signed   By: Ulyses SouthwardMark  Boles M.D.   On: 04/17/2013 17:20    Results for orders placed in visit on 04/14/13  BASIC METABOLIC PANEL      Result Value Range   Glucose 94  65 - 99 mg/dL   BUN 29 (*) 8 - 27 mg/dL   Creatinine, Ser 4.091.62 (*) 0.76 - 1.27 mg/dL   GFR calc non Af Amer 39 (*) >59 mL/min/1.73   GFR  calc Af Amer 45 (*) >59 mL/min/1.73   BUN/Creatinine Ratio 18  10 - 22   Sodium 139  134 - 144 mmol/L   Potassium 5.7 (*) 3.5 - 5.2 mmol/L   Chloride 101  97 - 108 mmol/L   CO2 23  18 - 29 mmol/L   Calcium 9.1  8.6 - 10.2 mg/dL     Assessment & Plan:      COPD exacerbation  Dyspnea - Plan: DG Chest 2 View  Influenza with respiratory manifestations  I still suspect that this is from his influenza, with associated chronic obstructive pulmonary disease exacerbation. His chest x-ray appears clear, so I'm doubtful for any additional bacterial pneumonia. He did recently finish 10 days of  Levaquin.  Suspect this is more his steroids wearing off with underlying lung disease and acute influenza. I'm going to prolong his prednisone.  His creatinine is 1.6. I spoke on the phone with Dr. Mariah Milling to discuss what was going on with the patient. He likely would try to admit the patient and hydrated him prior to cardiac catheterization, and either he or Dr. Kirke Corin will be fully assessing him to make sure that he is stable prior to elective cath.  Orders Placed This Encounter  Procedures  . DG Chest 2 View    New medications, updates to list, dose adjustments: Meds ordered this encounter  Medications  . predniSONE (DELTASONE) 20 MG tablet    Sig: 2 po daily for 5 days, then 1 po daily for 5 days    Dispense:  15 tablet    Refill:  0    Signed,  Arnez Stoneking T. Ofilia Rayon, MD, CAQ Sports Medicine  [facility] [address] [place] [address] Phone: [phone] Fax: [phone]    Medication List       This list is accurate as of: 04/17/13 11:59 PM.  Always use your most recent med list.               albuterol 108 (90 BASE) MCG/ACT inhaler  Commonly known as:  PROVENTIL HFA;VENTOLIN HFA  Inhale 2 puffs into the lungs every 6 (six) hours as needed for wheezing.     aspirin 81 MG EC tablet  Take 81 mg by mouth daily.     budesonide-formoterol 160-4.5 MCG/ACT inhaler  Commonly known as:  SYMBICORT  Inhale 2 puffs into the lungs 2 (two) times daily.     doxazosin 4 MG tablet  Commonly known as:  CARDURA  Take 4 mg by mouth at bedtime.     furosemide 40 MG tablet  Commonly known as:  LASIX  Take 0.5-1 tablets (20-40 mg total) by mouth daily. 40 mg if a lot of swelling 20 mg if no swelling     glipiZIDE 10 MG 24 hr tablet  Commonly known as:  GLUCOTROL XL  Take 1 tablet (10 mg total) by mouth daily.     losartan 50 MG tablet  Commonly known as:  COZAAR  Take 0.5 tablets (25 mg total) by mouth daily.     ONETOUCH DELICA LANCETS 33G Misc      predniSONE 20 MG tablet  Commonly known as:  DELTASONE  2 po daily for 5 days, then 1 po daily for 5 days     simvastatin 40 MG tablet  Commonly known as:  ZOCOR  TAKE ONE TABLET BY MOUTH EVERY NIGHT AT BEDTIME

## 2013-04-18 NOTE — Telephone Encounter (Signed)
Left message to pt or wife to call back.

## 2013-04-18 NOTE — Telephone Encounter (Signed)
Spoke w/ pt's wife.  Cath sched for 04/25/13 @ 9:30, [facility] at 8:30. Will call pt early next week to see how he is feeling and sched time to come in for preprocedure labs. Pt had cxr yesterday w/ Dr. Patsy Lageropland.

## 2013-04-20 ENCOUNTER — Other Ambulatory Visit: Payer: Self-pay | Admitting: Family Medicine

## 2013-04-21 NOTE — Telephone Encounter (Signed)
Attempted to call pt to see how he is doing and if he would like to proceed w/ cath sched for 1/16. Left message for pt or his wife to call back.

## 2013-04-21 NOTE — Telephone Encounter (Signed)
Spoke w/ Dewayne HatchAnn.  She reports that pt is feeling much better and would like to proceed w/ cath on 1/16.  Ann reports that Dr. Patsy Lageropland wanted to make sure that pt's kidneys were okay before cath. Advised pt that the labs that Dr.[name] will draw before the procedure will check his kidneys. Asked her to bring pt here or to the hospital for lab draw before Thursday. She reports that she will call for an appt for labs, but would like to wait for the weather to clear up before doing so.

## 2013-04-23 ENCOUNTER — Ambulatory Visit (INDEPENDENT_AMBULATORY_CARE_PROVIDER_SITE_OTHER): Payer: Medicare HMO | Admitting: *Deleted

## 2013-04-23 DIAGNOSIS — Z01812 Encounter for preprocedural laboratory examination: Secondary | ICD-10-CM

## 2013-04-23 DIAGNOSIS — E785 Hyperlipidemia, unspecified: Secondary | ICD-10-CM

## 2013-04-23 DIAGNOSIS — R0602 Shortness of breath: Secondary | ICD-10-CM

## 2013-04-23 NOTE — Telephone Encounter (Signed)
Pt came in for labs today.  Advised pt that Dr. Mariah MillingGollan is out of the office w/ his new baby and that I will call him if we need to reschedule this.  Pt states that he would like to proceed w/ procedure on Friday, with Dr. Kirke CorinArida if possible, as he states "I hope I have a blockage, because everything else has been ruled out." Pt asked for me to pray that he has a blockage and that it can be fixed. Reports that he has COPD but feels that his DOE is r/t his heart and that he "dreads the walk downstairs". Advised pt that I will call him if his cath needs to be rescheduled.

## 2013-04-23 NOTE — Telephone Encounter (Signed)
I can cath him if ok with TG.

## 2013-04-24 ENCOUNTER — Telehealth: Payer: Self-pay

## 2013-04-24 LAB — CBC WITH DIFFERENTIAL
BASOS: 1 %
Basophils Absolute: 0.1 10*3/uL (ref 0.0–0.2)
EOS: 1 %
Eosinophils Absolute: 0.1 10*3/uL (ref 0.0–0.4)
HCT: 39.9 % (ref 37.5–51.0)
HEMOGLOBIN: 12.6 g/dL (ref 12.6–17.7)
IMMATURE GRANS (ABS): 0.4 10*3/uL — AB (ref 0.0–0.1)
Immature Granulocytes: 4 %
LYMPHS ABS: 1 10*3/uL (ref 0.7–3.1)
Lymphs: 7 %
MCH: 29.4 pg (ref 26.6–33.0)
MCHC: 31.6 g/dL (ref 31.5–35.7)
MCV: 93 fL (ref 79–97)
MONOCYTES: 6 %
Monocytes Absolute: 0.7 10*3/uL (ref 0.1–0.9)
NEUTROS PCT: 81 %
Neutrophils Absolute: 9.6 10*3/uL — ABNORMAL HIGH (ref 1.4–7.0)
PLATELETS: 188 10*3/uL (ref 150–379)
RBC: 4.29 x10E6/uL (ref 4.14–5.80)
RDW: 13.8 % (ref 12.3–15.4)
WBC: 11.8 10*3/uL — AB (ref 3.4–10.8)

## 2013-04-24 LAB — PROTIME-INR
INR: 1 (ref 0.8–1.2)
PROTHROMBIN TIME: 9.9 s (ref 9.1–12.0)

## 2013-04-24 LAB — BASIC METABOLIC PANEL
BUN/Creatinine Ratio: 22 (ref 10–22)
BUN/Creatinine Ratio: 20 (ref 10–22)
BUN: 25 mg/dL (ref 8–27)
BUN: 24 mg/dL (ref 8–27)
CALCIUM: 9.1 mg/dL (ref 8.6–10.2)
CALCIUM: 9.3 mg/dL (ref 8.6–10.2)
CHLORIDE: 103 mmol/L (ref 97–108)
CO2: 21 mmol/L (ref 18–29)
CO2: 22 mmol/L (ref 18–29)
CREATININE: 1.13 mg/dL (ref 0.76–1.27)
Chloride: 102 mmol/L (ref 97–108)
Creatinine, Ser: 1.2 mg/dL (ref 0.76–1.27)
GFR calc Af Amer: 70 mL/min/{1.73_m2} (ref >59)
GFR calc Af Amer: 65 mL/min/{1.73_m2} (ref >59)
GFR calc non Af Amer: 60 mL/min/{1.73_m2} (ref >59)
GFR calc non Af Amer: 56 mL/min/{1.73_m2} — ABNORMAL LOW (ref >59)
GLUCOSE: 195 mg/dL — AB (ref 65–99)
Glucose: 191 mg/dL — ABNORMAL HIGH (ref 65–99)
POTASSIUM: 5.6 mmol/L — AB (ref 3.5–5.2)
Potassium: 5.5 mmol/L — ABNORMAL HIGH (ref 3.5–5.2)
Sodium: 141 mmol/L (ref 134–144)
Sodium: 140 mmol/L (ref 134–144)

## 2013-04-24 LAB — SPECIMEN STATUS REPORT

## 2013-04-24 NOTE — Telephone Encounter (Signed)
Spoke w/ pt.  He reports that he is having left sided chest pain and does not feel that he can wait until next week for his cath.  Reports "extreme difficulty" in catching his breath. Describes the pain as short lived that shoots thru his chest then goes away. Reports that he has nitro but it is 558-78 years old. He is interested in getting a new rx for this. Reports that he has done very little activity today but is very SOB, to the point that it is difficult for him to speak to me.  Advised pt to take another aspirin and if symptoms worsen or continue he is to call 911 immediately. He verbalizes understanding and repeats the instructions to me.

## 2013-04-24 NOTE — Telephone Encounter (Signed)
Spoke w/ pt and advised him that I had spoken w/ Dr. Mariah MillingGollan regarding cath sched for tomorrow.  Pt is agreeable to moving cath to next Friday, Jan 23 @ 8:30, pt to [facility] at 7:30am. Pt reports that this works out, as his wife has the gout, fell this am, and he needs to be with her tomorrow.  Pt to call the office w/ further questions or concerns.

## 2013-04-25 NOTE — Telephone Encounter (Signed)
Left message for pt to call me back and let me know how he is feeling today.

## 2013-04-25 NOTE — Telephone Encounter (Signed)
Spoke w/ pt.  He reports that he is feeling better today, feels that his symptoms were mostly r/t anxiety yesterday. Reports that wife has gout & weakness and fell yesterday; had difficulty getting her up.   States that he had to call his son and granddaughter to get her up off the bathroom floor. Reports that he still experiences sob w/ exertion so he is taking it easy today. [facility], so he is thankful that he did not have his cath today in order to take care of her. Pt to call the office if he has further questions or concerns.

## 2013-04-26 ENCOUNTER — Inpatient Hospital Stay: Payer: Self-pay | Admitting: Internal Medicine

## 2013-04-26 LAB — COMPREHENSIVE METABOLIC PANEL
ALBUMIN: 2.6 g/dL — AB (ref 3.4–5.0)
ALK PHOS: 64 U/L
Anion Gap: 3 — ABNORMAL LOW (ref 7–16)
BILIRUBIN TOTAL: 0.5 mg/dL (ref 0.2–1.0)
BUN: 32 mg/dL — AB (ref 7–18)
CHLORIDE: 105 mmol/L (ref 98–107)
Calcium, Total: 8.8 mg/dL (ref 8.5–10.1)
Co2: 26 mmol/L (ref 21–32)
Creatinine: 1.5 mg/dL — ABNORMAL HIGH (ref 0.60–1.30)
EGFR (African American): 50 — ABNORMAL LOW
EGFR (Non-African Amer.): 43 — ABNORMAL LOW
Glucose: 110 mg/dL — ABNORMAL HIGH (ref 65–99)
Osmolality: 276 (ref 275–301)
POTASSIUM: 4.7 mmol/L (ref 3.5–5.1)
SGOT(AST): 17 U/L (ref 15–37)
SGPT (ALT): 20 U/L (ref 12–78)
Sodium: 134 mmol/L — ABNORMAL LOW (ref 136–145)
Total Protein: 5.9 g/dL — ABNORMAL LOW (ref 6.4–8.2)

## 2013-04-26 LAB — PRO B NATRIURETIC PEPTIDE: B-TYPE NATIURETIC PEPTID: 4155 pg/mL — AB (ref 0–450)

## 2013-04-26 LAB — CBC
HCT: 37.7 % — ABNORMAL LOW (ref 40.0–52.0)
HGB: 12.3 g/dL — ABNORMAL LOW (ref 13.0–18.0)
MCH: 29.7 pg (ref 26.0–34.0)
MCHC: 32.7 g/dL (ref 32.0–36.0)
MCV: 91 fL (ref 80–100)
Platelet: 139 10*3/uL — ABNORMAL LOW (ref 150–440)
RBC: 4.15 10*6/uL — AB (ref 4.40–5.90)
RDW: 14 % (ref 11.5–14.5)
WBC: 14.2 10*3/uL — AB (ref 3.8–10.6)

## 2013-04-26 LAB — TROPONIN I
Troponin-I: <0.02 ng/mL
Troponin-I: <0.02 ng/mL
Troponin-I: <0.02 ng/mL

## 2013-04-27 DIAGNOSIS — I517 Cardiomegaly: Secondary | ICD-10-CM

## 2013-04-27 DIAGNOSIS — I5033 Acute on chronic diastolic (congestive) heart failure: Secondary | ICD-10-CM

## 2013-04-27 LAB — BASIC METABOLIC PANEL
Anion Gap: 6 — ABNORMAL LOW (ref 7–16)
Anion Gap: 5 — ABNORMAL LOW (ref 7–16)
BUN: 36 mg/dL — AB (ref 7–18)
BUN: 43 mg/dL — AB (ref 7–18)
CALCIUM: 9.2 mg/dL (ref 8.5–10.1)
CO2: 28 mmol/L (ref 21–32)
CREATININE: 1.41 mg/dL — AB (ref 0.60–1.30)
CREATININE: 2.18 mg/dL — AB (ref 0.60–1.30)
Calcium, Total: 8.6 mg/dL (ref 8.5–10.1)
Chloride: 102 mmol/L (ref 98–107)
Chloride: 98 mmol/L (ref 98–107)
Co2: 31 mmol/L (ref 21–32)
EGFR (African American): 53 — ABNORMAL LOW
EGFR (Non-African Amer.): 46 — ABNORMAL LOW
EGFR (Non-African Amer.): 27 — ABNORMAL LOW
GFR CALC AF AMER: 32 — AB
GLUCOSE: 48 mg/dL — AB (ref 65–99)
GLUCOSE: 318 mg/dL — AB (ref 65–99)
Osmolality: 283 (ref 275–301)
Osmolality: 286 (ref 275–301)
POTASSIUM: 4.6 mmol/L (ref 3.5–5.1)
Potassium: 3.9 mmol/L (ref 3.5–5.1)
SODIUM: 139 mmol/L (ref 136–145)
SODIUM: 131 mmol/L — AB (ref 136–145)

## 2013-04-27 LAB — LIPID PANEL
Cholesterol: 155 mg/dL (ref 0–200)
HDL Cholesterol: 69 mg/dL — ABNORMAL HIGH (ref 40–60)
Ldl Cholesterol, Calc: 62 mg/dL (ref 0–100)
Triglycerides: 120 mg/dL (ref 0–200)
VLDL Cholesterol, Calc: 24 mg/dL (ref 5–40)

## 2013-04-27 LAB — MAGNESIUM: Magnesium: 1.8 mg/dL

## 2013-04-27 LAB — TSH: Thyroid Stimulating Horm: 0.846 u[IU]/mL

## 2013-04-27 LAB — HEMOGLOBIN A1C: Hemoglobin A1C: 8 % — ABNORMAL HIGH (ref 4.2–6.3)

## 2013-04-28 LAB — LIPID PANEL WITH LDL/HDL RATIO
Cholesterol, Total: 190 mg/dL (ref 100–199)
HDL: 63 mg/dL
LDL Calculated: 76 mg/dL (ref 0–99)
LDl/HDL Ratio: 1.2 ratio (ref 0.0–3.6)
Triglycerides: 253 mg/dL — ABNORMAL HIGH (ref 0–149)

## 2013-04-28 LAB — CBC WITH DIFFERENTIAL/PLATELET
BANDS NEUTROPHIL: 3 %
COMMENT - H1-COM1: NORMAL
EOS PCT: 1 %
HCT: 35.3 % — AB (ref 40.0–52.0)
HGB: 11.7 g/dL — AB (ref 13.0–18.0)
Lymphocytes: 10 %
MCH: 30.4 pg (ref 26.0–34.0)
MCHC: 33.1 g/dL (ref 32.0–36.0)
MCV: 92 fL (ref 80–100)
MYELOCYTE: 2 %
Metamyelocyte: 3 %
Monocytes: 10 %
Platelet: 132 10*3/uL — ABNORMAL LOW (ref 150–440)
RBC: 3.84 10*6/uL — ABNORMAL LOW (ref 4.40–5.90)
RDW: 14.7 % — AB (ref 11.5–14.5)
Segmented Neutrophils: 71 %
WBC: 9 10*3/uL (ref 3.8–10.6)

## 2013-04-28 LAB — BASIC METABOLIC PANEL
ANION GAP: 5 — AB (ref 7–16)
BUN: 51 mg/dL — AB (ref 7–18)
Calcium, Total: 8.8 mg/dL (ref 8.5–10.1)
Chloride: 99 mmol/L (ref 98–107)
Co2: 31 mmol/L (ref 21–32)
Creatinine: 2.19 mg/dL — ABNORMAL HIGH (ref 0.60–1.30)
EGFR (Non-African Amer.): 27 — ABNORMAL LOW
GFR CALC AF AMER: 31 — AB
Glucose: 76 mg/dL (ref 65–99)
Osmolality: 283 (ref 275–301)
Potassium: 4.1 mmol/L (ref 3.5–5.1)
Sodium: 135 mmol/L — ABNORMAL LOW (ref 136–145)

## 2013-04-28 LAB — SPECIMEN STATUS REPORT

## 2013-04-29 ENCOUNTER — Encounter: Payer: Self-pay | Admitting: Cardiovascular Disease

## 2013-04-29 DIAGNOSIS — I2510 Atherosclerotic heart disease of native coronary artery without angina pectoris: Secondary | ICD-10-CM

## 2013-04-29 DIAGNOSIS — N289 Disorder of kidney and ureter, unspecified: Secondary | ICD-10-CM

## 2013-04-29 HISTORY — PX: CARDIAC CATHETERIZATION: SHX172

## 2013-04-29 LAB — BASIC METABOLIC PANEL
ANION GAP: 5 — AB (ref 7–16)
Anion Gap: 4 — ABNORMAL LOW (ref 7–16)
BUN: 47 mg/dL — ABNORMAL HIGH (ref 7–18)
BUN: 47 mg/dL — AB (ref 7–18)
Calcium, Total: 8.6 mg/dL (ref 8.5–10.1)
Calcium, Total: 8.4 mg/dL — ABNORMAL LOW (ref 8.5–10.1)
Chloride: 100 mmol/L (ref 98–107)
Chloride: 102 mmol/L (ref 98–107)
Co2: 28 mmol/L (ref 21–32)
Co2: 29 mmol/L (ref 21–32)
Creatinine: 1.91 mg/dL — ABNORMAL HIGH (ref 0.60–1.30)
Creatinine: 1.82 mg/dL — ABNORMAL HIGH (ref 0.60–1.30)
EGFR (African American): 37 — ABNORMAL LOW
EGFR (Non-African Amer.): 32 — ABNORMAL LOW
GFR CALC AF AMER: 39 — AB
GFR CALC NON AF AMER: 34 — AB
GLUCOSE: 142 mg/dL — AB (ref 65–99)
Glucose: 136 mg/dL — ABNORMAL HIGH (ref 65–99)
Osmolality: 279 (ref 275–301)
Osmolality: 287 (ref 275–301)
POTASSIUM: 4.6 mmol/L (ref 3.5–5.1)
Potassium: 4.5 mmol/L (ref 3.5–5.1)
SODIUM: 132 mmol/L — AB (ref 136–145)
Sodium: 136 mmol/L (ref 136–145)

## 2013-04-30 LAB — BASIC METABOLIC PANEL
Anion Gap: 2 — ABNORMAL LOW (ref 7–16)
BUN: 43 mg/dL — AB (ref 7–18)
CREATININE: 1.56 mg/dL — AB (ref 0.60–1.30)
Calcium, Total: 8.8 mg/dL (ref 8.5–10.1)
Chloride: 101 mmol/L (ref 98–107)
Co2: 30 mmol/L (ref 21–32)
EGFR (African American): 47 — ABNORMAL LOW
EGFR (Non-African Amer.): 41 — ABNORMAL LOW
Glucose: 134 mg/dL — ABNORMAL HIGH (ref 65–99)
Osmolality: 279 (ref 275–301)
Potassium: 4.6 mmol/L (ref 3.5–5.1)
Sodium: 133 mmol/L — ABNORMAL LOW (ref 136–145)

## 2013-05-01 ENCOUNTER — Telehealth: Payer: Self-pay | Admitting: Family Medicine

## 2013-05-01 LAB — BASIC METABOLIC PANEL
Anion Gap: 3 — ABNORMAL LOW (ref 7–16)
BUN: 41 mg/dL — AB (ref 7–18)
CALCIUM: 8.9 mg/dL (ref 8.5–10.1)
Chloride: 101 mmol/L (ref 98–107)
Co2: 29 mmol/L (ref 21–32)
Creatinine: 1.57 mg/dL — ABNORMAL HIGH (ref 0.60–1.30)
EGFR (Non-African Amer.): 40 — ABNORMAL LOW
GFR CALC AF AMER: 47 — AB
Glucose: 274 mg/dL — ABNORMAL HIGH (ref 65–99)
OSMOLALITY: 286 (ref 275–301)
POTASSIUM: 4.7 mmol/L (ref 3.5–5.1)
Sodium: 133 mmol/L — ABNORMAL LOW (ref 136–145)

## 2013-05-01 NOTE — Telephone Encounter (Signed)
Relevant patient education assigned to patient using Emmi. ° °

## 2013-05-02 DIAGNOSIS — I5033 Acute on chronic diastolic (congestive) heart failure: Secondary | ICD-10-CM

## 2013-05-07 ENCOUNTER — Ambulatory Visit (INDEPENDENT_AMBULATORY_CARE_PROVIDER_SITE_OTHER): Payer: Medicare HMO | Admitting: Family Medicine

## 2013-05-07 ENCOUNTER — Encounter: Payer: Self-pay | Admitting: Family Medicine

## 2013-05-07 VITALS — BP 167/59 | HR 45 | Temp 97.7°F | Ht 67.5 in | Wt 187.0 lb

## 2013-05-07 DIAGNOSIS — N183 Chronic kidney disease, stage 3 unspecified: Secondary | ICD-10-CM

## 2013-05-07 DIAGNOSIS — J449 Chronic obstructive pulmonary disease, unspecified: Secondary | ICD-10-CM

## 2013-05-07 DIAGNOSIS — I5032 Chronic diastolic (congestive) heart failure: Secondary | ICD-10-CM

## 2013-05-07 DIAGNOSIS — I25810 Atherosclerosis of coronary artery bypass graft(s) without angina pectoris: Secondary | ICD-10-CM

## 2013-05-07 MED ORDER — ISOSORBIDE MONONITRATE ER 30 MG PO TB24: 30.0000 mg | ORAL_TABLET | Freq: Every day | ORAL | Status: DC

## 2013-05-07 NOTE — Progress Notes (Signed)
Date:  05/07/2013   Name:  Timothy Cordova   DOB:  Oct 01, 1928   MRN:  [id number] Gender: male Age: 80 y.o.  Primary Physician:  Hannah Beat, MD   Chief Complaint: Hospitalization Follow-up   Subjective:   History of Present Illness:  Timothy Cordova is a 80 y.o. pleasant patient who presents with the following:  Is a patient who is been seen recently with influenza, that was diagnosed around Christmas time, and he subsequently developed some significant chronic obstructive pulmonary disease decompensation. I have placed him on 2 rounds of prednisone, Tamiflu, and antibiotics, but he continued to do poorly.  He had actually been scheduled to have an outpatient elective cardiac catheterization, and then he became more short of breath and presented to the emergency room. He was admitted at [facility], and he ultimately had a cardiac catheterization. That result is available under cardiac procedures and scanned media. Cardiology felt as if his bypass grafts were patent, and his symptoms were likely pulmonary. Pulmonology was consulted while he was in the hospital, and some of his medication management was optimized. Now he is compliant with doing his Spiriva, Symbicort, and he is intermittently been doing due to nebs. He is also been on a 12 day prednisone taper. He feels as if his breathing is better than it has been in many years. He now has a new pulmonologist, and he will be seeing Dr. Meredeth Ide in [place].  F/u COPD Diastolic heart failure  Refill isosorbide.  Now seeing Dr. Meredeth Ide, Pulm   Patient Active Problem List   Diagnosis Date Noted  . Aortic calcification, 04/20/2005 CT Chest 04/03/2013  . Sleep apnea   . History of tobacco abuse   . Chronic low back pain 04/18/2011  . Renal lesion 03/27/2011  . CKD (chronic kidney disease) stage 3, GFR 30-59 ml/min 08/18/2010  . OSTEOARTHRITIS 12/06/2009  . DIASTOLIC HEART FAILURE, CHRONIC 08/18/2009  . Mixed hyperlipidemia  06/11/2008  . CAD, AUTOLOGOUS BYPASS GRAFT 06/07/2008  . FATIGUE 12/17/2007  . DM (diabetes mellitus), type 2 with renal complications 09/04/2007  . DIZZINESS 09/04/2007  . ALLERGIC RHINITIS 01/30/2007  . DEMENTIA 07/06/2006  . HEARING LOSS 07/06/2006  . HYPERTENSION 07/06/2006  . BENIGN PROSTATIC HYPERTROPHY 07/06/2006  . COPD 08/08/2005    Past Medical History  Diagnosis Date  . Coronary artery disease     s/p CABG 2006. LHC (1/10): SVG-OM and PLOM, SVG-D, and LIMA-LAD were all patent  . Hypertension   . COPD (chronic obstructive pulmonary disease)     moderate. followed by Dr.Byrum  . Chronic diastolic heart failure     8/08 ECHO with EF 55%  . Edema     localized. suspect diastolic CHF + venous insufficiency  . Mixed hyperlipidemia   . Sleep apnea     ?  . Diabetes mellitus   . History of tobacco abuse   . Allergic rhinitis   . Hypercholesterolemia   . Benign prostatic hypertrophy   . Osteoarthritis   . Dementia     (mild)--06/2006  . Hearing loss     left >>right  . CKD (chronic kidney disease)     Past Surgical History  Procedure Laterality Date  . Angioplasty  [id number]  . Rotator cuff repair  2005    left, [place]  . Coronary artery bypass graft  02/2005  . Cardiac catheterization  90s    [place] x1stent  . Cardiac catheterization      [facility];x2 stents  History   Social History  . Marital Status: Married    Spouse Name: N/A    Number of Children: N/A  . Years of Education: N/A   Occupational History  . rubber fabrication     RETIRED   Social History Main Topics  . Smoking status: Former Smoker -- 3.00 packs/day for 60 years    Types: Cigarettes    Quit date: 07/12/2005  . Smokeless tobacco: Never Used  . Alcohol Use: No  . Drug Use: No  . Sexual Activity: Not on file   Other Topics Concern  . Not on file   Social History Narrative  . No narrative on file    Family History  Problem Relation Age of Onset  . Heart attack Mother    . Lung cancer Sister     Allergies  Allergen Reactions  . Metformin And Related   . Tramadol Hcl     Tremor vs seizure activity  . Ace Inhibitors     REACTION: cough  . Atorvastatin     REACTION: weakness  . Codeine Nausea And Vomiting  . Morphine Sulfate     REACTION: unspecified  . Ticlopidine Hcl     REACTION: unspecified  . Warfarin Sodium Other (See Comments)    Nausea and vomiting     Medication list has been reviewed and updated.  Review of Systems:   GEN: recent flu, hosp, COPD exac. GI: No n/v/d, eating normally Pulm: No SOB - much improved. Interactive and getting along well at home.  Otherwise, ROS is as per the HPI.  Objective:   Physical Examination: BP 167/59  Pulse 45  Temp(Src) 97.7 F (36.5 C) (Oral)  Ht 5' 7.5" (1.715 m)  Wt 187 lb (84.823 kg)  BMI 28.84 kg/m2  SpO2 96%  Ideal Body Weight: Weight in (lb) to have BMI = 25: 161.7   GEN: WDWN, NAD, Non-toxic, A & O x 3 HEENT: Atraumatic, Normocephalic. Neck supple. No masses, No LAD. Ears and Nose: No external deformity. CV: RRR, No M/G/R. No JVD. No thrill. No extra heart sounds. PULM: CTA B, minimal wheezing. No retractions. No resp. distress. No accessory muscle use. EXTR: No c/c/e NEURO Normal gait.  PSYCH: Normally interactive. Conversant. Not depressed or anxious appearing.  Calm demeanor.   Laboratory and Imaging Data:  Assessment & Plan:    COPD  CAD, AUTOLOGOUS BYPASS GRAFT  DIASTOLIC HEART FAILURE, CHRONIC  CKD (chronic kidney disease) stage 3, GFR 30-59 ml/min  He is doing much better than any time I've seen him recently. I encouraged him to continue doing all of his pulmonary medications.  We reviewed that he should generally only be taking 20 mg of Lasix daily, about his kidneys with a higher dose. I am also going to continue the extended release nitroglycerin and he was given in the hospital, and I think it does make some good sense with significant coronary  disease and diastolic heart failure.  F/u with me at least in the spring if not sooner.  New medications, updates to list, dose adjustments: Meds ordered this encounter  Medications  . nebivolol (BYSTOLIC) 5 MG tablet    Sig: Take 5 mg by mouth daily.  [name]. tiotropium (SPIRIVA) 18 MCG inhalation capsule    Sig: Place 18 mcg into inhaler and inhale daily.  [name]. DISCONTD: isosorbide mononitrate (IMDUR) 30 MG 24 hr tablet    Sig: Take 30 mg by mouth daily.  [name]. levofloxacin (LEVAQUIN) 250 MG tablet  Sig: Take 250 mg by mouth daily.  [name] ipratropium-albuterol (DUONEB) 0.5-2.5 (3) MG/3ML SOLN    Sig: Take 3 mLs by nebulization 4 (four) times daily as needed.  . furosemide (LASIX) 40 MG tablet    Sig: Take 40 mg by mouth daily. 40 mg if a lot of swelling 20 mg if no swelling  . losartan (COZAAR) 50 MG tablet    Sig: Take 50 mg by mouth daily.  . isosorbide mononitrate (IMDUR) 30 MG 24 hr tablet    Sig: Take 1 tablet (30 mg total) by mouth daily.    Dispense:  90 tablet    Refill:  3    Signed,  Amarylis Rovito T. Larua Collier, MD, CAQ Sports Medicine  [facility] [address] [place] [address] Phone: [phone] Fax: [phone]    Medication List       This list is accurate as of: 05/07/13 11:59 PM.  Always use your most recent med list.               albuterol 108 (90 BASE) MCG/ACT inhaler  Commonly known as:  PROVENTIL HFA;VENTOLIN HFA  Inhale 2 puffs into the lungs every 6 (six) hours as needed for wheezing.     aspirin 81 MG EC tablet  Take 81 mg by mouth daily.     budesonide-formoterol 160-4.5 MCG/ACT inhaler  Commonly known as:  SYMBICORT  Inhale 2 puffs into the lungs 2 (two) times daily.     doxazosin 4 MG tablet  Commonly known as:  CARDURA  Take 4 mg by mouth at bedtime.     DUONEB 0.5-2.5 (3) MG/3ML Soln  Generic drug:  ipratropium-albuterol  Take 3 mLs by nebulization 4 (four) times daily as needed.     furosemide 40 MG tablet    Commonly known as:  LASIX  Take 40 mg by mouth daily. 40 mg if a lot of swelling 20 mg if no swelling     glipiZIDE 10 MG 24 hr tablet  Commonly known as:  GLUCOTROL XL  Take 1 tablet (10 mg total) by mouth daily.     isosorbide mononitrate 30 MG 24 hr tablet  Commonly known as:  IMDUR  Take 1 tablet (30 mg total) by mouth daily.     levofloxacin 250 MG tablet  Commonly known as:  LEVAQUIN  Take 250 mg by mouth daily.     losartan 50 MG tablet  Commonly known as:  COZAAR  Take 50 mg by mouth daily.     nebivolol 5 MG tablet  Commonly known as:  BYSTOLIC  Take 5 mg by mouth daily.     ONE TOUCH ULTRA TEST test strip  Generic drug:  glucose blood  CHECK BLOOD SUGAR TWICE DAILY AS DIRECTED     ONETOUCH DELICA LANCETS 33G Misc     predniSONE 20 MG tablet  Commonly known as:  DELTASONE  2 po daily for 5 days, then 1 po daily for 5 days     simvastatin 40 MG tablet  Commonly known as:  ZOCOR  TAKE ONE TABLET BY MOUTH EVERY NIGHT AT BEDTIME     tiotropium 18 MCG inhalation capsule  Commonly known as:  SPIRIVA  Place 18 mcg into inhaler and inhale daily.

## 2013-05-07 NOTE — Progress Notes (Signed)
Pre-visit discussion using our clinic review tool. No additional management support is needed unless otherwise documented below in the visit note.  

## 2013-05-21 ENCOUNTER — Encounter: Payer: Self-pay | Admitting: Cardiovascular Disease

## 2013-05-21 ENCOUNTER — Ambulatory Visit (INDEPENDENT_AMBULATORY_CARE_PROVIDER_SITE_OTHER): Payer: Medicare HMO | Admitting: Cardiovascular Disease

## 2013-05-21 VITALS — BP 110/68 | HR 96 | Ht 67.5 in | Wt 184.5 lb

## 2013-05-21 DIAGNOSIS — N183 Chronic kidney disease, stage 3 unspecified: Secondary | ICD-10-CM

## 2013-05-21 DIAGNOSIS — E1129 Type 2 diabetes mellitus with other diabetic kidney complication: Secondary | ICD-10-CM

## 2013-05-21 DIAGNOSIS — E782 Mixed hyperlipidemia: Secondary | ICD-10-CM

## 2013-05-21 DIAGNOSIS — R0602 Shortness of breath: Secondary | ICD-10-CM

## 2013-05-21 DIAGNOSIS — I25810 Atherosclerosis of coronary artery bypass graft(s) without angina pectoris: Secondary | ICD-10-CM

## 2013-05-21 DIAGNOSIS — J441 Chronic obstructive pulmonary disease with (acute) exacerbation: Secondary | ICD-10-CM | POA: Insufficient documentation

## 2013-05-21 MED ORDER — PREDNISONE 20 MG PO TABS: 20.0000 mg | ORAL_TABLET | Freq: Every day | ORAL | Status: DC

## 2013-05-21 NOTE — Patient Instructions (Signed)
Please restart prednisone : 40 mg once a day for 5 days     Then down to 30 mg for 5 days     Then down to 20 mg for 14 days  Then 10 mg for at least 14 days   Please call us if you have new issues that need to be addressed before your next appt.  Your physician wants you to follow-up in: 1 month.  You will receive a reminder letter in the mail two months in advance. If you don't receive a letter, please call our office to schedule the follow-up appointment.

## 2013-05-21 NOTE — Assessment & Plan Note (Signed)
We have encouraged continued exercise, careful diet management in an effort to lose weight. 

## 2013-05-21 NOTE — Progress Notes (Signed)
Patient ID: Timothy Cordova, male    DOB: April 10, 1929, 80 y.o.   MRN: [id number]  HPI Comments: 80 yo with history of CAD s/p CABG in 2006, history of diastolic CHF, severe COPD with 50+ year smoking history , diabetes ,  presenting for routine cardiology evaluation.  CORONARY ARTERY DISEASE: s/p CABG 2006.  LHC (1/10): SVG-RCA, SVG-OM and PLOM, SVG-D, and LIMA-LAD were all patent.   Mild chronic renal dysfunction likely from overdiuresis He reports that he has chronic shortness of breath.   he had recent hospital admission from January 18 to 04/29/2013 for shortness of breath. Initially was felt he had diastolic CHF. He had worsening renal function and diuretics were held. He was started on prednisone, antibiotics with significant improvement of his symptoms.. He has felt well since discharge until his prednisone ran out. Now very short of breath as he was doing previously. Echocardiogram and cardiac catheterization in the hospital suggested normal right ventricular systolic pressures. Catheterization showing stable patent bypass grafts including LIMA to the LAD, vein graft to the PDA, vein graft to the OM and SVG to diagonal. Patent left subclavian artery stent. He is requesting additional prednisone for his symptoms  Previous anginal symptoms and 2006 included chest pain in his sternal area with profound weakness.    No claudication-type pain in his legs. Weight has been relatively stable   ECG: Shows normal sinus rhythm with rate 96 beats per minute, right bundle branch block, left anterior fascicular block  Social History: Retired--rubber Data processing managerfabrication Lives in [place] Married--1 son Former Smoker--long time then quit 2006 Alcohol use-no Enjoys golf and yard work   Past Medical History: 1. CORONARY ARTERY DISEASE: s/p CABG 2006.  LHC (1/10): SVG-RCA, SVG-OM and PLOM, SVG-D, and LIMA-LAD were all patent.   2. HYPERTENSION   3. COPD: Moderate.  Followed by Dr. Delton CoombesByrum.   4.  DIASTOLIC HEART FAILURE, CHRONIC: 8/08 echo with EF 55%.   5. EDEMA- LOCALIZED. Suspect diastolic CHF + venous insufficiency.   6. MIXED HYPERLIPIDEMIA 7. ? of SLEEP APNEA 8. DIABETES MELLITUS, TYPE II   9. TOBACCO ABUSE, HX OF   10. ALLERGIC RHINITIS   11. HYPERCHOLESTEROLEMIA   12. BENIGN PROSTATIC HYPERTROPHY   13. OSTEOARTHRITIS   14. Dementia (mild)--3/08 15. Hearing loss L>>R 16. CKD with mild hyperkalemia 17. Ruptured disc L-spine with low back pain.    Outpatient Encounter Prescriptions as of 05/21/2013  Medication Sig  . albuterol (PROVENTIL HFA;VENTOLIN HFA) 108 (90 BASE) MCG/ACT inhaler Inhale 2 puffs into the lungs every 6 (six) hours as needed for wheezing.  [name]. aspirin 81 MG EC tablet Take 81 mg by mouth daily.    . budesonide-formoterol (SYMBICORT) 160-4.5 MCG/ACT inhaler Inhale 2 puffs into the lungs 2 (two) times daily.  [name]. doxazosin (CARDURA) 4 MG tablet Take 4 mg by mouth at bedtime.  . furosemide (LASIX) 40 MG tablet Take 40 mg by mouth daily. 40 mg if a lot of swelling 20 mg if no swelling  . glipiZIDE (GLUCOTROL XL) 10 MG 24 hr tablet Take 1 tablet (10 mg total) by mouth daily.  [name]. ipratropium-albuterol (DUONEB) 0.5-2.5 (3) MG/3ML SOLN Take 3 mLs by nebulization 4 (four) times daily as needed.  . isosorbide mononitrate (IMDUR) 30 MG 24 hr tablet Take 1 tablet (30 mg total) by mouth daily.  [name]. losartan (COZAAR) 50 MG tablet Take 50 mg by mouth daily.  . nebivolol (BYSTOLIC) 5 MG tablet Take 5 mg by mouth daily.  . ONE TOUCH  ULTRA TEST test strip CHECK BLOOD SUGAR TWICE DAILY AS DIRECTED  . ONETOUCH DELICA LANCETS 33G MISC   . simvastatin (ZOCOR) 40 MG tablet TAKE ONE TABLET BY MOUTH EVERY NIGHT AT BEDTIME  . tiotropium (SPIRIVA) 18 MCG inhalation capsule Place 18 mcg into inhaler and inhale daily.    Review of Systems  HENT: Negative.   Eyes: Negative.   Respiratory: Positive for chest tightness and shortness of breath.   Cardiovascular: Negative.    Gastrointestinal: Negative.   Endocrine: Negative.   Musculoskeletal: Negative.   Skin: Negative.   Allergic/Immunologic: Negative.   Neurological: Positive for weakness.  Hematological: Negative.   Psychiatric/Behavioral: Negative.   All other systems reviewed and are negative.   BP 110/68  Pulse 96  Ht 5' 7.5" (1.715 m)  Wt 184 lb 8 oz (83.689 kg)  BMI 28.45 kg/m2  Physical Exam  Nursing note and vitals reviewed. Constitutional: He is oriented to person, place, and time. He appears well-developed and well-nourished.  HENT:  Head: Normocephalic.  Nose: Nose normal.  Mouth/Throat: Oropharynx is clear and moist.  Eyes: Conjunctivae are normal. Pupils are equal, round, and reactive to light.  Neck: Normal range of motion. Neck supple. No JVD present.  Cardiovascular: Normal rate, S1 normal, S2 normal, normal heart sounds and intact distal pulses.  A regularly irregular rhythm present. Exam reveals no gallop and no friction rub.   No murmur heard. Pulmonary/Chest: Effort normal. No respiratory distress. He has decreased breath sounds. He has no wheezes. He has no rales. He exhibits no tenderness.  Abdominal: Soft. Bowel sounds are normal. He exhibits no distension. There is no tenderness.  Musculoskeletal: Normal range of motion. He exhibits no edema and no tenderness.  Lymphadenopathy:    He has no cervical adenopathy.  Neurological: He is alert and oriented to person, place, and time. Coordination normal.  Skin: Skin is warm and dry. No rash noted. No erythema.  Psychiatric: He has a normal mood and affect. His behavior is normal. Judgment and thought content normal.      Assessment and Plan

## 2013-05-21 NOTE — Assessment & Plan Note (Signed)
Recommended he stay on his simvastatin

## 2013-05-21 NOTE — Assessment & Plan Note (Signed)
Recent cardiac catheterization showing patent grafts. No further testing needed at this time

## 2013-05-21 NOTE — Assessment & Plan Note (Signed)
Recent worsening of his renal function with overdiuresis. Encouraged him to stay on his current diuretic regimen. May need repeat basic metabolic panel in followup

## 2013-05-21 NOTE — Assessment & Plan Note (Addendum)
He has worsening shortness of breath off prednisone. We will restart his prednisone taper given the severity of his symptoms. We have recommended he take: 40 mg once a day for 5 days , Then down to 30 mg for 5 days , Then down to 20 mg for 14 days , Then 10 mg for at least 14 days

## 2013-05-26 ENCOUNTER — Encounter: Payer: Self-pay | Admitting: Family Medicine

## 2013-05-26 ENCOUNTER — Ambulatory Visit (INDEPENDENT_AMBULATORY_CARE_PROVIDER_SITE_OTHER): Payer: Medicare HMO | Admitting: Family Medicine

## 2013-05-26 ENCOUNTER — Telehealth: Payer: Self-pay | Admitting: Family Medicine

## 2013-05-26 VITALS — BP 87/51 | HR 85 | Temp 97.4°F | Ht 67.5 in | Wt 182.2 lb

## 2013-05-26 DIAGNOSIS — J441 Chronic obstructive pulmonary disease with (acute) exacerbation: Secondary | ICD-10-CM

## 2013-05-26 DIAGNOSIS — J440 Chronic obstructive pulmonary disease with acute lower respiratory infection: Secondary | ICD-10-CM

## 2013-05-26 MED ORDER — DOXYCYCLINE HYCLATE 100 MG PO TABS: 100.0000 mg | ORAL_TABLET | Freq: Two times a day (BID) | ORAL | Status: DC

## 2013-05-26 NOTE — Progress Notes (Signed)
Pre-visit discussion using our clinic review tool. No additional management support is needed unless otherwise documented below in the visit note.  

## 2013-05-26 NOTE — Telephone Encounter (Signed)
Confidential Office Message [address] [place] [address] p. [phone] f. [phone] To: [facility] (After Hours Triage) Fax: [phone] From: [facility] Date/ Time: 05/24/2013 8:19 PM Taken By: Ezra SitesKathy Smith, RN Caller: Thurston HoleAnne Facility: Not Collected Patient: Timothy HeritageHarrelson, Pinchas DOB: 05/15/1928 Phone: [phone] Reason for Call: Caller was unable to be reached on callback - Left Message Regarding Appointment: No Appt Date: Appt Time: Unknown Provider: Reason: Details: Outcome: Confidential

## 2013-05-26 NOTE — Progress Notes (Signed)
Date:  05/26/2013   Name:  Timothy Cordova   DOB:  Mar 03, 1929   MRN:  [id number] Gender: male Age: 80 y.o.  Primary Physician:  Hannah BeatSpencer Jorden Minchey, MD   Chief Complaint: Follow-up   Subjective:   History of Present Illness:  Timothy Cordova is a 80 y.o. pleasant patient who presents with the following:  He is here today with worsening shortness of breath. He was seen 5 days ago by Dr. Mariah MillingGollan, and he felt his symptoms were mostly due to COPD exacerbation. He put him on a long prednisone taper, but the patient still has persistent dyspnea with minimal exertion. He has had an extensive coronary evaluation including coronary catheterization which was grossly normal. He also started to see a new pulmonologist.  He is compliant with all his pulmonary medications including Spiriva and Symbicort. He also is doing his duo nebs approximately 3 times daily and using his albuterol inhaler in addition to this every 4-6 hours.  When I had him walk around the office, his pulse ox dropped to 90%. This is on room air.   05/07/2013 Last OV with Hannah BeatSpencer Klare Criss, MD  Is a patient who is been seen recently with influenza, that was diagnosed around Christmas time, and he subsequently developed some significant chronic obstructive pulmonary disease decompensation. I have placed him on 2 rounds of prednisone, Tamiflu, and antibiotics, but he continued to do poorly.  He had actually been scheduled to have an outpatient elective cardiac catheterization, and then he became more short of breath and presented to the emergency room. He was admitted at [facility], and he ultimately had a cardiac catheterization. That result is available under cardiac procedures and scanned media. Cardiology felt as if his bypass grafts were patent, and his symptoms were likely pulmonary. Pulmonology was consulted while he was in the hospital, and some of his medication management was optimized. Now he is compliant with doing his Spiriva,  Symbicort, and he is intermittently been doing due to nebs. He is also been on a 12 day prednisone taper. He feels as if his breathing is better than it has been in many years. He now has a new pulmonologist, and he will be seeing Dr. M[place]eth [place]Fleming in [place].  F/u COPD Diastolic heart failure  Refill isosorbide.  Now seeing Dr. Meredeth IdeFleming, Pulm   Patient Active Problem List   Diagnosis Date Noted  . COPD with acute exacerbation 05/21/2013  . Aortic calcification, 04/20/2005 CT Chest 04/03/2013  . Sleep apnea   . History of tobacco abuse   . Chronic low back pain 04/18/2011  . Renal lesion 03/27/2011  . CKD (chronic kidney disease) stage 3, GFR 30-59 ml/min 08/18/2010  . OSTEOARTHRITIS 12/06/2009  . DIASTOLIC HEART FAILURE, CHRONIC 08/18/2009  . Mixed hyperlipidemia 06/11/2008  . CAD, AUTOLOGOUS BYPASS GRAFT 06/07/2008  . FATIGUE 12/17/2007  . DM (diabetes mellitus), type 2 with renal complications 09/04/2007  . DIZZINESS 09/04/2007  . ALLERGIC RHINITIS 01/30/2007  . DEMENTIA 07/06/2006  . HEARING LOSS 07/06/2006  . HYPERTENSION 07/06/2006  . BENIGN PROSTATIC HYPERTROPHY 07/06/2006  . COPD 08/08/2005    Past Medical History  Diagnosis Date  . Coronary artery disease     s/p CABG 2006. LHC (1/10): SVG-OM and PLOM, SVG-D, and LIMA-LAD were all patent  . Hypertension   . COPD (chronic obstructive pulmonary disease)     moderate. followed by Dr.Byrum  . Chronic diastolic heart failure     8/08 ECHO with EF 55%  . Edema  localized. suspect diastolic CHF + venous insufficiency  . Mixed hyperlipidemia   . Sleep apnea     ?  . Diabetes mellitus   . History of tobacco abuse   . Allergic rhinitis   . Hypercholesterolemia   . Benign prostatic hypertrophy   . Osteoarthritis   . Dementia     (mild)--06/2006  . Hearing loss     left >>right  . CKD (chronic kidney disease)     Past Surgical History  Procedure Laterality Date  . Angioplasty  [id number]  . Rotator cuff repair   2005    left, [place]  . Coronary artery bypass graft  02/2005  . Cardiac catheterization  90s    [place] x1stent  . Cardiac catheterization      [facility];x2 stents  . Cardiac catheterization  04/29/2013    History   Social History  . Marital Status: Married    Spouse Name: N/A    Number of Children: N/A  . Years of Education: N/A   Occupational History  . rubber fabrication     RETIRED   Social History Main Topics  . Smoking status: Former Smoker -- 3.00 packs/day for 60 years    Types: Cigarettes    Quit date: 07/12/2005  . Smokeless tobacco: Never Used  . Alcohol Use: No  . Drug Use: No  . Sexual Activity: Not on file   Other Topics Concern  . Not on file   Social History Narrative  . No narrative on file    Family History  Problem Relation Age of Onset  . Heart attack Mother   . Lung cancer Sister     Allergies  Allergen Reactions  . Metformin And Related   . Tramadol Hcl     Tremor vs seizure activity  . Ace Inhibitors     REACTION: cough  . Atorvastatin     REACTION: weakness  . Codeine Nausea And Vomiting  . Morphine Sulfate     REACTION: unspecified  . Ticlopidine Hcl     REACTION: unspecified  . Warfarin Sodium Other (See Comments)    Nausea and vomiting     Medication list has been reviewed and updated.  Review of Systems:   GEN: recent flu, hosp, COPD exac. GI: No n/v/d, eating normally Pulm: SOB with minimal exertion Interactive and getting along well at home.  Otherwise, ROS is as per the HPI.  Objective:   Physical Examination: BP 87/51  Pulse 85  Temp(Src) 97.4 F (36.3 C) (Oral)  Ht 5' 7.5" (1.715 m)  Wt 182 lb 4 oz (82.668 kg)  BMI 28.11 kg/m2  SpO2 90%  Ideal Body Weight: Weight in (lb) to have BMI = 25: 161.7   GEN: WDWN, NAD, Non-toxic, A & O x 3 HEENT: Atraumatic, Normocephalic. Neck supple. No masses, No LAD. Ears and Nose: No external deformity. CV: RRR, No M/G/R. No JVD. No thrill. No extra heart  sounds. PULM: CTA B, minimal wheezing. No retractions. No resp. distress. Fait R crackles EXTR: No c/c/e NEURO Normal gait.  PSYCH: Normally interactive. Conversant. Not depressed or anxious appearing.  Calm demeanor.   Laboratory and Imaging Data:  Assessment & Plan:    COPD with acute exacerbation  Bronchitis, chronic obstructive w acute bronchitis  Decreased pulm function compared to last OV Suspect COPD with potential early PNA  I encouraged oxygen use at home and at nighttime. If he continues to do poorly, he may need additional pulmonary recommendations, because at this  point I am not sure what else to offer him.  New medications, updates to list, dose adjustments: Meds ordered this encounter  Medications  . predniSONE (DELTASONE) 20 MG tablet    Sig: 40 mg once a day for 5 days Then down to 30 mg for 5 days Then down to 20 mg for 14 days Then 10 mg for at least 14 days  . doxycycline (VIBRA-TABS) 100 MG tablet    Sig: Take 1 tablet (100 mg total) by mouth 2 (two) times daily.    Dispense:  20 tablet    Refill:  0    Signed,  Susy Placzek T. Adama Ivins, MD, CAQ Sports Medicine  [facility] [address] [place] [address] Phone: [phone] Fax: [phone]    Medication List       This list is accurate as of: 05/26/13  1:41 PM.  Always use your most recent med list.               albuterol 108 (90 BASE) MCG/ACT inhaler  Commonly known as:  PROVENTIL HFA;VENTOLIN HFA  Inhale 2 puffs into the lungs every 6 (six) hours as needed for wheezing.     aspirin 81 MG EC tablet  Take 81 mg by mouth daily.     budesonide-formoterol 160-4.5 MCG/ACT inhaler  Commonly known as:  SYMBICORT  Inhale 2 puffs into the lungs 2 (two) times daily.     doxazosin 4 MG tablet  Commonly known as:  CARDURA  Take 4 mg by mouth at bedtime.     doxycycline 100 MG tablet  Commonly known as:  VIBRA-TABS  Take 1 tablet (100 mg total) by mouth 2 (two)  times daily.     DUONEB 0.5-2.5 (3) MG/3ML Soln  Generic drug:  ipratropium-albuterol  Take 3 mLs by nebulization 4 (four) times daily as needed.     furosemide 40 MG tablet  Commonly known as:  LASIX  Take 40 mg by mouth daily. 40 mg if a lot of swelling 20 mg if no swelling     glipiZIDE 10 MG 24 hr tablet  Commonly known as:  GLUCOTROL XL  Take 1 tablet (10 mg total) by mouth daily.     isosorbide mononitrate 30 MG 24 hr tablet  Commonly known as:  IMDUR  Take 1 tablet (30 mg total) by mouth daily.     losartan 50 MG tablet  Commonly known as:  COZAAR  Take 50 mg by mouth daily.     nebivolol 5 MG tablet  Commonly known as:  BYSTOLIC  Take 5 mg by mouth daily.     ONE TOUCH ULTRA TEST test strip  Generic drug:  glucose blood  CHECK BLOOD SUGAR TWICE DAILY AS DIRECTED     ONETOUCH DELICA LANCETS 33G Misc     predniSONE 20 MG tablet  Commonly known as:  DELTASONE  - 40 mg once a day for 5 days  - Then down to 30 mg for 5 days  - Then down to 20 mg for 14 days  - Then 10 mg for at least 14 days     simvastatin 40 MG tablet  Commonly known as:  ZOCOR  TAKE ONE TABLET BY MOUTH EVERY NIGHT AT BEDTIME     tiotropium 18 MCG inhalation capsule  Commonly known as:  SPIRIVA  Place 18 mcg into inhaler and inhale daily.

## 2013-06-11 ENCOUNTER — Other Ambulatory Visit: Payer: Self-pay | Admitting: Family Medicine

## 2013-06-18 ENCOUNTER — Ambulatory Visit (INDEPENDENT_AMBULATORY_CARE_PROVIDER_SITE_OTHER): Payer: Medicare HMO | Admitting: Cardiovascular Disease

## 2013-06-18 ENCOUNTER — Encounter: Payer: Self-pay | Admitting: Cardiovascular Disease

## 2013-06-18 VITALS — BP 122/62 | HR 76 | Ht 67.5 in | Wt 183.5 lb

## 2013-06-18 DIAGNOSIS — E1129 Type 2 diabetes mellitus with other diabetic kidney complication: Secondary | ICD-10-CM

## 2013-06-18 DIAGNOSIS — I5032 Chronic diastolic (congestive) heart failure: Secondary | ICD-10-CM

## 2013-06-18 DIAGNOSIS — I10 Essential (primary) hypertension: Secondary | ICD-10-CM

## 2013-06-18 DIAGNOSIS — J441 Chronic obstructive pulmonary disease with (acute) exacerbation: Secondary | ICD-10-CM

## 2013-06-18 DIAGNOSIS — E782 Mixed hyperlipidemia: Secondary | ICD-10-CM

## 2013-06-18 DIAGNOSIS — I25810 Atherosclerosis of coronary artery bypass graft(s) without angina pectoris: Secondary | ICD-10-CM

## 2013-06-18 NOTE — Assessment & Plan Note (Signed)
Significantly improved symptoms with prednisone taper and antibiotics. Suggested he followup with pulmonary and stay on prednisone 10 mg until that time

## 2013-06-18 NOTE — Assessment & Plan Note (Signed)
Recent leg swelling likely from prednisone. We started Lasix with improved symptoms. Minimal edema on today's visit

## 2013-06-18 NOTE — Assessment & Plan Note (Signed)
Currently with no symptoms of angina. No further workup at this time. Continue current medication regimen. 

## 2013-06-18 NOTE — Assessment & Plan Note (Signed)
Cholesterol is at goal on the current lipid regimen. No changes to the medications were made.  

## 2013-06-18 NOTE — Progress Notes (Signed)
Patient ID: Timothy Cordova, male    DOB: 1928/06/16, 80 y.o.   MRN: [id number]  HPI Comments: 80 yo with history of CAD s/p CABG in 2006, history of diastolic CHF, severe COPD with 50+ year smoking history , diabetes ,  presenting for routine cardiology evaluation.  CORONARY ARTERY DISEASE: s/p CABG 2006.  LHC (1/10): SVG-RCA, SVG-OM and PLOM, SVG-D, and LIMA-LAD were all patent.   Mild chronic renal dysfunction likely from overdiuresis He reports that he has chronic shortness of breath.   On his most recent visit, he had severe shortness of breath, cough, COPD exacerbation symptoms. We started prednisone and antibiotic. Prednisone taper was long and he remains on 10 mg daily. He reports symptoms have significantly improved. Now 80% back to baseline. He is walking, hiking 15 minutes per day, going to get the mail with no symptoms apart from his mild chronic shortness of breath.   hospital admission from January 18 to 04/29/2013 for shortness of breath. renal function and diuretics were held.  started on prednisone, antibiotics with significant improvement of his symptoms.[name] symptoms recurred  Echocardiogram and cardiac catheterization in the hospital suggested normal right ventricular systolic pressures. Catheterization showing stable patent bypass grafts including LIMA to the LAD, vein graft to the PDA, vein graft to the OM and SVG to diagonal. Patent left subclavian artery stent. He is requesting additional prednisone for his symptoms  Previous anginal symptoms and 2006 included chest pain in his sternal area with profound weakness.    No claudication-type pain in his legs. Weight has been relatively stable   ECG: Shows normal sinus rhythm with rate 96 beats per minute, right bundle branch block, left anterior fascicular block  Social History: Retired--rubber Data processing manager Lives in [place] son Former Smoker--long time then quit 2006 Alcohol use-no Enjoys golf and yard  work   Past Medical History: 1. CORONARY ARTERY DISEASE: s/p CABG 2006.  LHC (1/10): SVG-RCA, SVG-OM and PLOM, SVG-D, and LIMA-LAD were all patent.   2. HYPERTENSION   3. COPD: Moderate.  Followed by Dr. Delton Coombes.   4. DIASTOLIC HEART FAILURE, CHRONIC: 8/08 echo with EF 55%.   5. EDEMA- LOCALIZED. Suspect diastolic CHF + venous insufficiency.   6. MIXED HYPERLIPIDEMIA 7. ? of SLEEP APNEA 8. DIABETES MELLITUS, TYPE II   9. TOBACCO ABUSE, HX OF   10. ALLERGIC RHINITIS   11. HYPERCHOLESTEROLEMIA   12. BENIGN PROSTATIC HYPERTROPHY   13. OSTEOARTHRITIS   14. Dementia (mild)--3/08 15. Hearing loss L>>R 16. CKD with mild hyperkalemia 17. Ruptured disc L-spine with low back pain.    Outpatient Encounter Prescriptions as of 05/21/2013  Medication Sig  . albuterol (PROVENTIL HFA;VENTOLIN HFA) 108 (90 BASE) MCG/ACT inhaler Inhale 2 puffs into the lungs every 6 (six) hours as needed for wheezing.  [name] aspirin 81 MG EC tablet Take 81 mg by mouth daily.    . budesonide-formoterol (SYMBICORT) 160-4.5 MCG/ACT inhaler Inhale 2 puffs into the lungs 2 (two) times daily.  [name] doxazosin (CARDURA) 4 MG tablet Take 4 mg by mouth at bedtime.  . furosemide (LASIX) 40 MG tablet Take 40 mg by mouth daily. 40 mg if a lot of swelling 20 mg if no swelling  . glipiZIDE (GLUCOTROL XL) 10 MG 24 hr tablet Take 1 tablet (10 mg total) by mouth daily.  [name] ipratropium-albuterol (DUONEB) 0.5-2.5 (3) MG/3ML SOLN Take 3 mLs by nebulization 4 (four) times daily as needed.  . isosorbide mononitrate (IMDUR) 30 MG 24 hr tablet Take 1 tablet (  30 mg total) by mouth daily.  [name]. losartan (COZAAR) 50 MG tablet Take 50 mg by mouth daily.  . nebivolol (BYSTOLIC) 5 MG tablet Take 5 mg by mouth daily.  . ONE TOUCH ULTRA TEST test strip CHECK BLOOD SUGAR TWICE DAILY AS DIRECTED  . ONETOUCH DELICA LANCETS 33G MISC   . simvastatin (ZOCOR) 40 MG tablet TAKE ONE TABLET BY MOUTH EVERY NIGHT AT BEDTIME  . tiotropium (SPIRIVA) 18 MCG inhalation  capsule Place 18 mcg into inhaler and inhale daily.  Prednisone 10 mg daily   Review of Systems  HENT: Negative.   Eyes: Negative.   Respiratory: Positive for shortness of breath.   Cardiovascular: Negative.   Gastrointestinal: Negative.   Endocrine: Negative.   Musculoskeletal: Negative.   Skin: Negative.   Allergic/Immunologic: Negative.   Hematological: Negative.   Psychiatric/Behavioral: Negative.   All other systems reviewed and are negative.   BP 122/62  Pulse 76  Ht 5' 7.5" (1.715 m)  Wt 183 lb 8 oz (83.235 kg)  BMI 28.30 kg/m2  Physical Exam  Nursing note and vitals reviewed. Constitutional: He is oriented to person, place, and time. He appears well-developed and well-nourished.  HENT:  Head: Normocephalic.  Nose: Nose normal.  Mouth/Throat: Oropharynx is clear and moist.  Eyes: Conjunctivae are normal. Pupils are equal, round, and reactive to light.  Neck: Normal range of motion. Neck supple. No JVD present.  Cardiovascular: Normal rate, S1 normal, S2 normal, normal heart sounds and intact distal pulses.  A regularly irregular rhythm present. Exam reveals no gallop and no friction rub.   No murmur heard. Pulmonary/Chest: Effort normal. No respiratory distress. He has decreased breath sounds. He has no wheezes. He has no rales. He exhibits no tenderness.  Abdominal: Soft. Bowel sounds are normal. He exhibits no distension. There is no tenderness.  Musculoskeletal: Normal range of motion. He exhibits no edema and no tenderness.  Lymphadenopathy:    He has no cervical adenopathy.  Neurological: He is alert and oriented to person, place, and time. Coordination normal.  Skin: Skin is warm and dry. No rash noted. No erythema.  Psychiatric: He has a normal mood and affect. His behavior is normal. Judgment and thought content normal.      Assessment and Plan

## 2013-06-18 NOTE — Assessment & Plan Note (Signed)
Blood pressure is well controlled on today's visit. No changes made to the medications. 

## 2013-06-18 NOTE — Assessment & Plan Note (Signed)
We have encouraged continued exercise, careful diet management in an effort to lose weight. 

## 2013-06-18 NOTE — Patient Instructions (Signed)
You are doing well.a Please continue bystolic 5 mg daily as samples permit Continue prednisone 10 mg a day  Please call us if you have new issues that need to be addressed before your next appt.  Your physician wants you to follow-up in: 6 months.  You will receive a reminder letter in the mail two months in advance. If you don't receive a letter, please call our office to schedule the follow-up appointment.

## 2013-07-07 ENCOUNTER — Encounter: Payer: Self-pay | Admitting: Internal Medicine

## 2013-07-07 ENCOUNTER — Ambulatory Visit (INDEPENDENT_AMBULATORY_CARE_PROVIDER_SITE_OTHER): Payer: Medicare HMO | Admitting: Internal Medicine

## 2013-07-07 VITALS — BP 134/60 | HR 76 | Temp 97.9°F | Wt 183.0 lb

## 2013-07-07 DIAGNOSIS — J441 Chronic obstructive pulmonary disease with (acute) exacerbation: Secondary | ICD-10-CM

## 2013-07-07 MED ORDER — LEVOFLOXACIN 500 MG PO TABS: 500.0000 mg | ORAL_TABLET | Freq: Every day | ORAL | Status: DC

## 2013-07-07 NOTE — Progress Notes (Signed)
HPI  Pt presents to the clinic today with c/o cough and chest congestion. This started 4 months ago. The cough is productive of green/brown phlegm. He also feel short of breath at time. He does have a history of COPD. He reports he will take the antibiotic and steroids, feel better, then symptoms will return in 1 week. He reports that he is on spiriva, symbicort, albuterol. He also has a duoneb that he is using. He did have a normal chest xray 04/17/2013.  He is currently on a prednisone taper prescribed  by Dr. Mariah MillingGollan. He does have a pulmonologist, [facility].   Review of Systems      Past Medical History  Diagnosis Date  . Coronary artery disease     s/p CABG 2006. LHC (1/10): SVG-OM and PLOM, SVG-D, and LIMA-LAD were all patent  . Hypertension   . COPD (chronic obstructive pulmonary disease)     moderate. followed by Dr.Byrum  . Chronic diastolic heart failure     8/08 ECHO with EF 55%  . Edema     localized. suspect diastolic CHF + venous insufficiency  . Mixed hyperlipidemia   . Sleep apnea     ?  . Diabetes mellitus   . History of tobacco abuse   . Allergic rhinitis   . Hypercholesterolemia   . Benign prostatic hypertrophy   . Osteoarthritis   . Dementia     (mild)--06/2006  . Hearing loss     left >>right  . CKD (chronic kidney disease)     Family History  Problem Relation Age of Onset  . Heart attack Mother   . Lung cancer Sister     History   Social History  . Marital Status: Married    Spouse Name: N/A    Number of Children: N/A  . Years of Education: N/A   Occupational History  . rubber fabrication     RETIRED   Social History Main Topics  . Smoking status: Former Smoker -- 3.00 packs/day for 60 years    Types: Cigarettes    Quit date: 07/12/2005  . Smokeless tobacco: Never Used  . Alcohol Use: No  . Drug Use: No  . Sexual Activity: Not on file   Other Topics Concern  . Not on file   Social History Narrative  . No narrative on file     Allergies  Allergen Reactions  . Metformin And Related   . Tramadol Hcl     Tremor vs seizure activity  . Ace Inhibitors     REACTION: cough  . Atorvastatin     REACTION: weakness  . Codeine Nausea And Vomiting  . Morphine Sulfate     REACTION: unspecified  . Ticlopidine Hcl     REACTION: unspecified  . Warfarin Sodium Other (See Comments)    Nausea and vomiting      Constitutional: Positive fatigue. Denies headache, fever or abrupt weight changes.  HEENT:  Denies eye redness, eye pain, pressure behind the eyes, facial pain, nasal congestion, ear pain, ringing in the ears, wax buildup, runny nose or bloody nose. Respiratory: Positive cough and shortness of breath. Denies difficulty breathing.  Cardiovascular: Denies chest pain, chest tightness, palpitations or swelling in the hands or feet.   No other specific complaints in a complete review of systems (except as listed in HPI above).  Objective:   BP 134/60  Pulse 76  Temp(Src) 97.9 F (36.6 C) (Oral)  Wt 183 lb (83.008 kg)  SpO2  97% Wt Readings from Last 3 Encounters:  07/07/13 183 lb (83.008 kg)  06/18/13 183 lb 8 oz (83.235 kg)  05/26/13 182 lb 4 oz (82.668 kg)     General: Appears his stated age, well developed, well nourished in NAD. HEENT: Head: normal shape and size; Eyes: sclera white, no icterus, conjunctiva pink, PERRLA and EOMs intact; Ears: Tm's gray and intact, normal light reflex; Nose: mucosa pink and moist, septum midline; Throat/Mouth: + PND. Teeth present, mucosa erythematous and moist, no exudate noted, no lesions or ulcerations noted.  Neck: Neck supple, trachea midline. No massses, lumps or thyromegaly present.  Cardiovascular: Normal rate and rhythm. S1,S2 noted.  No murmur, rubs or gallops noted. No JVD or BLE edema. No carotid bruits noted. Pulmonary/Chest: Increased effort and coarse with bilateral expiratory wheeze noted.     Assessment & Plan:   COPD exacerbation:  He is already on  a a pred taper- advised him to continue this Continue your inhalers as prescribed He feels like the mucous is more than what he normally produces eRx for Levaquin x 14 days Follow up with Dr. Mayo Ao He declines repeat chest xray today  RTC as needed or if symptoms persist.

## 2013-07-07 NOTE — Progress Notes (Signed)
Pre visit review using our clinic review tool, if applicable. No additional management support is needed unless otherwise documented below in the visit note. 

## 2013-07-07 NOTE — Patient Instructions (Addendum)
Chronic Obstructive Pulmonary Disease  Chronic obstructive pulmonary disease (COPD) is a common lung condition in which airflow from the lungs is limited. COPD is a general term that can be used to describe many different lung problems that limit airflow, including both chronic bronchitis and emphysema.  If you have COPD, your lung function will probably never return to normal, but there are measures you can take to improve lung function and make yourself feel better.   CAUSES   · Smoking (common).    · Exposure to secondhand smoke.    · Genetic problems.  · Chronic inflammatory lung diseases or recurrent infections.  SYMPTOMS   · Shortness of breath, especially with physical activity.    · Deep, persistent (chronic) cough with a large amount of thick mucus.    · Wheezing.    · Rapid breaths (tachypnea).    · Gray or bluish discoloration (cyanosis) of the skin, especially in fingers, toes, or lips.    · Fatigue.    · Weight loss.    · Frequent infections or episodes when breathing symptoms become much worse (exacerbations).    · Chest tightness.  DIAGNOSIS   Your healthcare provider will take a medical history and perform a physical examination to make the initial diagnosis.  Additional tests for COPD may include:   · Lung (pulmonary) function tests.  · Chest X-ray.  · CT scan.  · Blood tests.  TREATMENT   Treatment available to help you feel better when you have COPD include:   · Inhaler and nebulizer medicines. These help manage the symptoms of COPD and make your breathing more comfortable  · Supplemental oxygen. Supplemental oxygen is only helpful if you have a low oxygen level in your blood.    · Exercise and physical activity. These are beneficial for nearly all people with COPD. Some people may also benefit from a pulmonary rehabilitation program.  HOME CARE INSTRUCTIONS   · Take all medicines (inhaled or pills) as directed by your health care provider.  · Only take over-the-counter or prescription medicines  for pain, fever, or discomfort as directed by your health care provider.    · Avoid over-the-counter medicines or cough syrups that dry up your airway (such as antihistamines) and slow down the elimination of secretions unless instructed otherwise by your healthcare provider.    · If you are a smoker, the most important thing that you can do is stop smoking. Continuing to smoke will cause further lung damage and breathing trouble. Ask your health care provider for help with quitting smoking. He or she can direct you to community resources or hospitals that provide support.  · Avoid exposure to irritants such as smoke, chemicals, and fumes that aggravate your breathing.  · Use oxygen therapy and pulmonary rehabilitation if directed by your health care provider. If you require home oxygen therapy, ask your healthcare provider whether you should purchase a pulse oximeter to measure your oxygen level at home.    · Avoid contact with individuals who have a contagious illness.  · Avoid extreme temperature and humidity changes.  · Eat healthy foods. Eating smaller, more frequent meals and resting before meals may help you maintain your strength.  · Stay active, but balance activity with periods of rest. Exercise and physical activity will help you maintain your ability to do things you want to do.  · Preventing infection and hospitalization is very important when you have COPD. Make sure to receive all the vaccines your health care provider recommends, especially the pneumococcal and influenza vaccines. Ask your healthcare provider whether you   need a pneumonia vaccine.  · Learn and use relaxation techniques to manage stress.  · Learn and use controlled breathing techniques as directed by your health care provider. Controlled breathing techniques include:    · Pursed lip breathing. Start by breathing in (inhaling) through your nose for 1 second. Then, purse your lips as if you were going to whistle and breathe out (exhale)  through the pursed lips for 2 seconds.    · Diaphragmatic breathing. Start by putting one hand on your abdomen just above your waist. Inhale slowly through your nose. The hand on your abdomen should move out. Then purse your lips and exhale slowly. You should be able to feel the hand on your abdomen moving in as you exhale.    · Learn and use controlled coughing to clear mucus from your lungs. Controlled coughing is a series of short, progressive coughs. The steps of controlled coughing are:    1. Lean your head slightly forward.    2. Breathe in deeply using diaphragmatic breathing.    3. Try to hold your breath for 3 seconds.    4. Keep your mouth slightly open while coughing twice.    5. Spit any mucus out into a tissue.    6. Rest and repeat the steps once or twice as needed.  SEEK MEDICAL CARE IF:   · You are coughing up more mucus than usual.    · There is a change in the color or thickness of your mucus.    · Your breathing is more labored than usual.    · Your breathing is faster than usual.    SEEK IMMEDIATE MEDICAL CARE IF:   · You have shortness of breath while you are resting.    · You have shortness of breath that prevents you from:  · Being able to talk.    · Performing your usual physical activities.    · You have chest pain lasting longer than 5 minutes.    · Your skin color is more cyanotic than usual.  · You measure low oxygen saturations for longer than 5 minutes with a pulse oximeter.  MAKE SURE YOU:   · Understand these instructions.  · Will watch your condition.  · Will get help right away if you are not doing well or get worse.  Document Released: 01/04/2005 Document Revised: 01/15/2013 Document Reviewed: 11/21/2012  Exit[employer]® Patient Information ©2014 [employer].

## 2013-07-14 ENCOUNTER — Other Ambulatory Visit: Payer: Self-pay | Admitting: Family Medicine

## 2013-07-15 NOTE — Telephone Encounter (Signed)
Last office visit 07/07/2013 with Nicki Reaperegina Baity.  Last A1c 09/20/2011.  Ok to refill?

## 2013-07-17 ENCOUNTER — Other Ambulatory Visit: Payer: Self-pay

## 2013-08-07 ENCOUNTER — Other Ambulatory Visit: Payer: Self-pay | Admitting: *Deleted

## 2013-08-07 MED ORDER — SIMVASTATIN 40 MG PO TABS: ORAL_TABLET | ORAL | Status: DC

## 2013-08-07 NOTE — Telephone Encounter (Signed)
Received fax from [facility] stating plan covers a 90 day supply at a retail pharmacy and request that we send a 90 day supply for Simvastatin 40 mg to [facility].

## 2013-09-15 ENCOUNTER — Other Ambulatory Visit: Payer: Self-pay

## 2013-09-15 MED ORDER — PREDNISONE 20 MG PO TABS: 20.0000 mg | ORAL_TABLET | Freq: Every day | ORAL | Status: DC

## 2013-09-15 NOTE — Telephone Encounter (Signed)
Refill sent in for prednisone 20 mg take one tablet.

## 2013-09-24 ENCOUNTER — Other Ambulatory Visit: Payer: Self-pay | Admitting: *Deleted

## 2013-09-24 MED ORDER — BUDESONIDE-FORMOTEROL FUMARATE 160-4.5 MCG/ACT IN AERO: 2.0000 | INHALATION_SPRAY | Freq: Two times a day (BID) | RESPIRATORY_TRACT | Status: DC

## 2013-10-06 LAB — HM DIABETES EYE EXAM

## 2013-10-08 ENCOUNTER — Encounter: Payer: Self-pay | Admitting: Family Medicine

## 2013-11-17 ENCOUNTER — Telehealth: Payer: Self-pay

## 2013-11-17 NOTE — Telephone Encounter (Signed)
Mrs Justice BritainHarrelson said pt has decided to go to [facility]. Pt request copy of medical records to [facility]. Pt will come by office to sign record release and [facility] will send records. Mrs Justice BritainHarrelson voiced understanding.

## 2013-12-30 ENCOUNTER — Other Ambulatory Visit: Payer: Self-pay | Admitting: *Deleted

## 2013-12-30 NOTE — Telephone Encounter (Signed)
Patient wants a written rx for prednisone. Patient states he is in the donut hole. Please call when ready for p/u

## 2013-12-30 NOTE — Telephone Encounter (Signed)
Please see below.

## 2014-01-02 ENCOUNTER — Encounter: Payer: Self-pay | Admitting: Cardiovascular Disease

## 2014-01-02 ENCOUNTER — Ambulatory Visit (INDEPENDENT_AMBULATORY_CARE_PROVIDER_SITE_OTHER): Payer: Medicare HMO | Admitting: Cardiovascular Disease

## 2014-01-02 VITALS — BP 112/68 | HR 92 | Ht 67.5 in | Wt 189.8 lb

## 2014-01-02 DIAGNOSIS — E782 Mixed hyperlipidemia: Secondary | ICD-10-CM

## 2014-01-02 DIAGNOSIS — I10 Essential (primary) hypertension: Secondary | ICD-10-CM

## 2014-01-02 DIAGNOSIS — N189 Chronic kidney disease, unspecified: Secondary | ICD-10-CM

## 2014-01-02 DIAGNOSIS — I25810 Atherosclerosis of coronary artery bypass graft(s) without angina pectoris: Secondary | ICD-10-CM

## 2014-01-02 DIAGNOSIS — I5032 Chronic diastolic (congestive) heart failure: Secondary | ICD-10-CM

## 2014-01-02 DIAGNOSIS — E1122 Type 2 diabetes mellitus with diabetic chronic kidney disease: Secondary | ICD-10-CM

## 2014-01-02 DIAGNOSIS — R0602 Shortness of breath: Secondary | ICD-10-CM

## 2014-01-02 DIAGNOSIS — N183 Chronic kidney disease, stage 3 unspecified: Secondary | ICD-10-CM

## 2014-01-02 DIAGNOSIS — E1129 Type 2 diabetes mellitus with other diabetic kidney complication: Secondary | ICD-10-CM

## 2014-01-02 DIAGNOSIS — J441 Chronic obstructive pulmonary disease with (acute) exacerbation: Secondary | ICD-10-CM

## 2014-01-02 NOTE — Assessment & Plan Note (Signed)
He appears relatively euvolemic on Lasix 40 mg daily

## 2014-01-02 NOTE — Assessment & Plan Note (Signed)
Cholesterol is at goal on the current lipid regimen. No changes to the medications were made.  

## 2014-01-02 NOTE — Patient Instructions (Signed)
You are doing well. Please try to wean the prednisone down to 5 mg a day  Cut back on ice cream!  Please call us if you have new issues that need to be addressed before your next appt.  Your physician wants you to follow-up in: 6 months.  You will receive a reminder letter in the mail two months in advance. If you don't receive a letter, please call our office to schedule the follow-up appointment.

## 2014-01-02 NOTE — Assessment & Plan Note (Signed)
We have encouraged continued exercise, careful diet management in an effort to lose weight. We'll try to wean the prednisone

## 2014-01-02 NOTE — Progress Notes (Signed)
Patient ID: Timothy Cordova, male    DOB: 11-04-1928, 80 y.o.   MRN: [id number]  HPI Comments: 80 yo with history of CAD s/p CABG in 2006, history of diastolic CHF, severe COPD with 50+ year smoking history , diabetes ,  presenting for routine cardiology evaluation.  CORONARY ARTERY DISEASE: s/p CABG 2006.  LHC (1/10): SVG-RCA, SVG-OM and PLOM, SVG-D, and LIMA-LAD were all patent.   Mild chronic renal dysfunction likely from overdiuresis He reports that he has chronic shortness of breath.   In followup today, [name] reports that he is doing well. His sugars are running high, hemoglobin A1c 10.3 He is eating ice cream, also reports the prednisone is making his sugars high Is scheduled to see endocrine in 2 months at the [facility] He reports his blood pressure is well controlled, weight is stable at 184-185 pounds, heart rate 60-80. Denies any significant shortness of breath on a low-dose prednisone He does report having significant problems if he stops the prednisone most recent creatinine 1.54, BUN 38, TSH 0.76, hematocrit 39   hospital admission from January 18 to 04/29/2013 for shortness of breath. renal function and diuretics were held.  started on prednisone, antibiotics with significant improvement of his symptoms.[name] symptoms recurred  Echocardiogram and cardiac catheterization in the hospital suggested normal right ventricular systolic pressures. Catheterization showing stable patent bypass grafts including LIMA to the LAD, vein graft to the PDA, vein graft to the OM and SVG to diagonal. Patent left subclavian artery stent. He is requesting additional prednisone for his symptoms  Previous anginal symptoms and 2006 included chest pain in his sternal area with profound weakness.    No claudication-type pain in his legs. Weight has been relatively stable   ECG: Shows normal sinus rhythm with rate 92 beats per minute, right bundle branch block, left anterior fascicular block,  APCs   Social History: Retired--rubber Data processing manager Lives in [place] son Former Smoker--long time then quit 2006 Alcohol use-no Enjoys golf and yard work   Past Medical History: 1. CORONARY ARTERY DISEASE: s/p CABG 2006.  LHC (1/10): SVG-RCA, SVG-OM and PLOM, SVG-D, and LIMA-LAD were all patent.   2. HYPERTENSION   3. COPD: Moderate.  Followed by Dr. Delton Coombes.   4. DIASTOLIC HEART FAILURE, CHRONIC: 8/08 echo with EF 55%.   5. EDEMA- LOCALIZED. Suspect diastolic CHF + venous insufficiency.   6. MIXED HYPERLIPIDEMIA 7. ? of SLEEP APNEA 8. DIABETES MELLITUS, TYPE II   9. TOBACCO ABUSE, HX OF   10. ALLERGIC RHINITIS   11. HYPERCHOLESTEROLEMIA   12. BENIGN PROSTATIC HYPERTROPHY   13. OSTEOARTHRITIS   14. Dementia (mild)--3/08 15. Hearing loss L>>R 16. CKD with mild hyperkalemia 17. Ruptured disc L-spine with low back pain.    Outpatient Encounter Prescriptions as of 01/02/2014  Medication Sig  . albuterol (PROVENTIL HFA;VENTOLIN HFA) 108 (90 BASE) MCG/ACT inhaler Inhale 2 puffs into the lungs every 6 (six) hours as needed for wheezing.  [name] aspirin 81 MG EC tablet Take 81 mg by mouth daily.    [name] atorvastatin (LIPITOR) 80 MG tablet Take 40 mg by mouth daily.  . budesonide-formoterol (SYMBICORT) 160-4.5 MCG/ACT inhaler Inhale 2 puffs into the lungs 2 (two) times daily.  . Cholecalciferol (VITAMIN D-3 PO) Take by mouth daily.  [name] doxazosin (CARDURA) 4 MG tablet TAKE 1 TO 2 TABLETS BY MOUTH ONCE DAILY AS DIRECTED  . furosemide (LASIX) 40 MG tablet Take 40 mg by mouth daily. 40 mg if a lot of swelling  20 mg if no swelling  . GLIPIZIDE XL 10 MG 24 hr tablet TAKE ONE (1) TABLET BY MOUTH EVERY DAY  . ipratropium-albuterol (DUONEB) 0.5-2.5 (3) MG/3ML SOLN Take 3 mLs by nebulization 4 (four) times daily as needed.  . isosorbide mononitrate (IMDUR) 30 MG 24 hr tablet Take 1 tablet (30 mg total) by mouth daily.  . Multiple Vitamins-Minerals (PRESERVISION AREDS PO) Take by mouth daily.   . nebivolol (BYSTOLIC) 5 MG tablet Take 5 mg by mouth daily.  . ONE TOUCH ULTRA TEST test strip CHECK BLOOD SUGAR TWICE DAILY AS DIRECTED  . ONETOUCH DELICA LANCETS 33G MISC   . predniSONE (DELTASONE) 10 MG tablet Take 1 tablet (10 mg total) by mouth daily with breakfast.  . tiotropium (SPIRIVA) 18 MCG inhalation capsule Place 18 mcg into inhaler and inhale daily.     Review of Systems  Constitutional: Negative.   HENT: Negative.   Eyes: Negative.   Respiratory: Negative.   Cardiovascular: Negative.   Gastrointestinal: Negative.   Endocrine: Negative.   Musculoskeletal: Negative.   Skin: Negative.   Allergic/Immunologic: Negative.   Neurological: Negative.   Hematological: Negative.   Psychiatric/Behavioral: Negative.   All other systems reviewed and are negative.  BP 112/68  Pulse 92  Ht 5' 7.5" (1.715 m)  Wt 189 lb 12 oz (86.07 kg)  BMI 29.26 kg/m2  Physical Exam  Nursing note and vitals reviewed. Constitutional: He is oriented to person, place, and time. He appears well-developed and well-nourished.  HENT:  Head: Normocephalic.  Nose: Nose normal.  Mouth/Throat: Oropharynx is clear and moist.  Eyes: Conjunctivae are normal. Pupils are equal, round, and reactive to light.  Neck: Normal range of motion. Neck supple. No JVD present.  Cardiovascular: Normal rate, S1 normal, S2 normal, normal heart sounds and intact distal pulses.  A regularly irregular rhythm present. Exam reveals no gallop and no friction rub.   No murmur heard. Pulmonary/Chest: Effort normal. No respiratory distress. He has decreased breath sounds. He has no wheezes. He has no rales. He exhibits no tenderness.  Abdominal: Soft. Bowel sounds are normal. He exhibits no distension. There is no tenderness.  Musculoskeletal: Normal range of motion. He exhibits no edema and no tenderness.  Lymphadenopathy:    He has no cervical adenopathy.  Neurological: He is alert and oriented to person, place, and time.  Coordination normal.  Skin: Skin is warm and dry. No rash noted. No erythema.  Psychiatric: He has a normal mood and affect. His behavior is normal. Judgment and thought content normal.      Assessment and Plan

## 2014-01-02 NOTE — Assessment & Plan Note (Signed)
Stable renal function.

## 2014-01-02 NOTE — Assessment & Plan Note (Signed)
Blood pressure is well controlled on today's visit. No changes made to the medications. 

## 2014-01-02 NOTE — Assessment & Plan Note (Signed)
Currently with no symptoms of angina. No further workup at this time. Continue current medication regimen. 

## 2014-01-02 NOTE — Assessment & Plan Note (Signed)
COPD exacerbation has resolved. He would like to stay on prednisone 10 mg. We'll rewrite the prescription for him but suggest he talk with Dr. Meredeth Ide about continuing on prednisone. Recommended he try to wean down to 5 mg daily

## 2014-01-26 ENCOUNTER — Telehealth: Payer: Self-pay

## 2014-01-26 NOTE — Telephone Encounter (Signed)
Left message for pt to call back  °

## 2014-01-26 NOTE — Telephone Encounter (Signed)
Pt wife called, states she needs to discuss a "report" his pulmonary Dr. Dewayne HatchWants Dr. Mariah MillingGollan to have.

## 2014-02-26 ENCOUNTER — Other Ambulatory Visit: Payer: Self-pay | Admitting: Family Medicine

## 2014-02-26 NOTE — Telephone Encounter (Signed)
Last office visit that addressed DM 12/18/2012.  Last A1c 10/14/2012.   No future appointments scheduled.  Ok to refill?

## 2014-02-27 NOTE — Telephone Encounter (Signed)
Ok to refill 90, 1 refill  And help set up Medicare CPX (30 min) in a few months

## 2014-03-03 ENCOUNTER — Telehealth: Payer: Self-pay | Admitting: *Deleted

## 2014-03-03 NOTE — Telephone Encounter (Signed)
-----   Message from Wyatt Portelaobin B Hayes sent at 03/03/2014  1:46 PM EST ----- Spoke with pt.  He does not want to schedule at this time ----- Message -----    From: Damita Lackonna S Dorsie Sethi, CMA    Sent: 02/27/2014  10:27 AM      To: Wyatt Portelaobin B Hayes  Will you please call and schedule Medicare Wellness with Dr. Patsy Lageropland sometime in the next couple of months with fasting labs prior.  Thanks, Lupita LeashDonna

## 2014-06-22 ENCOUNTER — Encounter: Payer: Self-pay | Admitting: Cardiovascular Disease

## 2014-06-22 ENCOUNTER — Ambulatory Visit (INDEPENDENT_AMBULATORY_CARE_PROVIDER_SITE_OTHER): Payer: Medicare Other | Admitting: Cardiovascular Disease

## 2014-06-22 VITALS — BP 140/72 | HR 75 | Ht 67.5 in | Wt 190.0 lb

## 2014-06-22 DIAGNOSIS — N183 Chronic kidney disease, stage 3 unspecified: Secondary | ICD-10-CM

## 2014-06-22 DIAGNOSIS — E118 Type 2 diabetes mellitus with unspecified complications: Secondary | ICD-10-CM

## 2014-06-22 DIAGNOSIS — I10 Essential (primary) hypertension: Secondary | ICD-10-CM

## 2014-06-22 DIAGNOSIS — R0602 Shortness of breath: Secondary | ICD-10-CM

## 2014-06-22 DIAGNOSIS — E782 Mixed hyperlipidemia: Secondary | ICD-10-CM

## 2014-06-22 DIAGNOSIS — I5032 Chronic diastolic (congestive) heart failure: Secondary | ICD-10-CM

## 2014-06-22 DIAGNOSIS — I25810 Atherosclerosis of coronary artery bypass graft(s) without angina pectoris: Secondary | ICD-10-CM

## 2014-06-22 DIAGNOSIS — E1122 Type 2 diabetes mellitus with diabetic chronic kidney disease: Secondary | ICD-10-CM

## 2014-06-22 DIAGNOSIS — N189 Chronic kidney disease, unspecified: Secondary | ICD-10-CM

## 2014-06-22 NOTE — Assessment & Plan Note (Signed)
Currently with no symptoms of angina. No further workup at this time. Continue current medication regimen. Stressed importance of good diabetes control. We will check hemoglobin A1c today

## 2014-06-22 NOTE — Progress Notes (Signed)
Patient ID: Timothy Cordova, male    DOB: 1928-05-26, 81 y.o.   MRN: [id number]  HPI Comments: 81 yo with history of CAD s/p CABG in 2006, history of diastolic CHF, severe COPD with 50+ year smoking history , diabetes ,  presenting for follow-up of his coronary artery disease CORONARY ARTERY DISEASE: s/p CABG 2006.  LHC (1/10): SVG-RCA, SVG-OM and PLOM, SVG-D, and LIMA-LAD were all patent.   Mild chronic renal dysfunction likely from overdiuresis He reports that he has chronic shortness of breath.  Catheterization January 2015 showing stable patent bypass grafts including LIMA to the LAD, vein graft to the PDA, vein graft to the OM and SVG to diagonal. Patent left subclavian artery stent.  In followup today, Timothy Cordova reports that he is doing well.  He has not been checking his sugars hemoglobin A1c 10.3 at the end of 2015 No recent lab work for diabetes. Previously was eating poorly also on prednisone Was previously scheduled to see endocrine at the [facility], this did not seem to happen No regular exercise. Denies any symptoms concerning for angina Still has chronic shortness of breath He did see pulmonary at the [facility], Dr. Mick Cordova. No note available to their system, was not in the [facility] system. Was told he had "blockages" Continues to have chronic shortness of breath with exertion. No worse than last year at the time of his catheterization  EKG on today's visit shows normal sinus rhythm with rate 75 bpm, right bundle branch block, left axis deviation/left anterior fascicular block  Other past medical history  hospital admission from January 18 to 04/29/2013 for shortness of breath. renal function and diuretics were held.  started on prednisone, antibiotics with significant improvement of his symptoms.[name] symptoms recurred  Echocardiogram and cardiac catheterization in the hospital suggested normal right ventricular systolic pressures.  He is requesting additional prednisone for  his symptoms  Previous anginal symptoms and 2006 included chest pain in his sternal area with profound weakness.    No claudication-type pain in his legs. Weight has been relatively stable    Allergies  Allergen Reactions  . Metformin And Related   . Tramadol Hcl     Tremor vs seizure activity  . Ace Inhibitors     REACTION: cough  . Atorvastatin     REACTION: weakness  . Codeine Nausea And Vomiting  . Morphine Sulfate     REACTION: unspecified  . Ticlopidine Hcl     REACTION: unspecified  . Warfarin Sodium Other (See Comments)    Nausea and vomiting     Outpatient Encounter Prescriptions as of 06/22/2014  Medication Sig  . albuterol (PROVENTIL HFA;VENTOLIN HFA) 108 (90 BASE) MCG/ACT inhaler Inhale 2 puffs into the lungs every 6 (six) hours as needed for wheezing.  [name] aspirin 81 MG EC tablet Take 81 mg by mouth daily.    [name] atorvastatin (LIPITOR) 80 MG tablet Take 40 mg by mouth daily.  . budesonide-formoterol (SYMBICORT) 160-4.5 MCG/ACT inhaler Inhale 2 puffs into the lungs 2 (two) times daily.  . Cholecalciferol (VITAMIN D-3 PO) Take 1,000 Units by mouth daily.   [name] doxazosin (CARDURA) 4 MG tablet TAKE 1 TO 2 TABLETS BY MOUTH ONCE DAILY AS DIRECTED  . GLIPIZIDE XL 10 MG 24 hr tablet TAKE 1 TABLET BY MOUTH DAILY  . ipratropium-albuterol (DUONEB) 0.5-2.5 (3) MG/3ML SOLN Take 3 mLs by nebulization 4 (four) times daily as needed.  . isosorbide mononitrate (IMDUR) 30 MG 24 hr tablet Take 1 tablet (  30 mg total) by mouth daily.  . metoprolol succinate (TOPROL-XL) 50 MG 24 hr tablet Take 25 mg by mouth daily. Take with or immediately following a meal.  . Multiple Vitamins-Minerals (PRESERVISION AREDS PO) Take by mouth daily.  . ONE TOUCH ULTRA TEST test strip CHECK BLOOD SUGAR TWICE DAILY AS DIRECTED  . ONETOUCH DELICA LANCETS 33G MISC   . tiotropium (SPIRIVA) 18 MCG inhalation capsule Place 18 mcg into inhaler and inhale daily.  . [DISCONTINUED] furosemide (LASIX) 40 MG tablet Take 40  mg by mouth daily. 40 mg if a lot of swelling 20 mg if no swelling  . [DISCONTINUED] nebivolol (BYSTOLIC) 5 MG tablet Take 5 mg by mouth daily.  . [DISCONTINUED] predniSONE (DELTASONE) 20 MG tablet Take 1 tablet (20 mg total) by mouth daily with breakfast. (Patient not taking: Reported on 06/22/2014)    Past Medical History  Diagnosis Date  . Coronary artery disease     s/p CABG 2006. LHC (1/10): SVG-OM and PLOM, SVG-D, and LIMA-LAD were all patent  . Hypertension   . COPD (chronic obstructive pulmonary disease)     moderate. followed by Dr.Byrum  . Chronic diastolic heart failure     8/08 ECHO with EF 55%  . Edema     localized. suspect diastolic CHF + venous insufficiency  . Mixed hyperlipidemia   . Sleep apnea     ?  . Diabetes mellitus   . History of tobacco abuse   . Allergic rhinitis   . Hypercholesterolemia   . Benign prostatic hypertrophy   . Osteoarthritis   . Dementia     (mild)--06/2006  . Hearing loss     left >>right  . CKD (chronic kidney disease)     Past Surgical History  Procedure Laterality Date  . Angioplasty  [id number]  . Rotator cuff repair  2005    left, Timothy Cordova  . Coronary artery bypass graft  02/2005  . Cardiac catheterization  90s    Timothy Cord[place] x1stent  . Cardiac catheterization      [facility];x2 stents  . Cardiac catheterization  04/29/2013    Social History  reports that he quit smoking about 8 years ago. His smoking use included Cigarettes. He has a 180 pack-year smoking history. He has never used smokeless tobacco. He reports that he does not drink alcohol or use illicit drugs.  Family History family history includes Heart attack in his mother; Lung cancer in his sister.   Review of Systems  Constitutional: Negative.   Respiratory: Positive for shortness of breath.   Cardiovascular: Negative.   Gastrointestinal: Negative.   Musculoskeletal: Negative.   Skin: Negative.   Neurological: Negative.   Hematological: Negative.    Psychiatric/Behavioral: Negative.   All other systems reviewed and are negative.  BP 140/72 mmHg  Pulse 75  Ht 5' 7.5" (1.715 m)  Wt 190 lb (86.183 kg)  BMI 29.30 kg/m2  Physical Exam  Constitutional: He is oriented to person, place, and time. He appears well-developed and well-nourished.  HENT:  Head: Normocephalic.  Nose: Nose normal.  Mouth/Throat: Oropharynx is clear and moist.  Eyes: Conjunctivae are normal. Pupils are equal, round, and reactive to light.  Neck: Normal range of motion. Neck supple. No JVD present.  Cardiovascular: Normal rate, S1 normal, S2 normal, normal heart sounds and intact distal pulses.  A regularly irregular rhythm present. Exam reveals no gallop and no friction rub.   No murmur heard. Pulmonary/Chest: Effort normal. No respiratory distress. He has decreased breath sounds.  He has no wheezes. He has no rales. He exhibits no tenderness.  Abdominal: Soft. Bowel sounds are normal. He exhibits no distension. There is no tenderness.  Musculoskeletal: Normal range of motion. He exhibits no edema or tenderness.  Lymphadenopathy:    He has no cervical adenopathy.  Neurological: He is alert and oriented to person, place, and time. Coordination normal.  Skin: Skin is warm and dry. No rash noted. No erythema.  Psychiatric: He has a normal mood and affect. His behavior is normal. Judgment and thought content normal.      Assessment and Plan   Nursing note and vitals reviewed.

## 2014-06-22 NOTE — Patient Instructions (Signed)
You are doing well. No medication changes were made.  Please call us if you have new issues that need to be addressed before your next appt.  Your physician wants you to follow-up in: 6 months.  You will receive a reminder letter in the mail two months in advance. If you don't receive a letter, please call our office to schedule the follow-up appointment.   

## 2014-06-22 NOTE — Assessment & Plan Note (Signed)
Blood pressure is well controlled on today's visit. No changes made to the medications. 

## 2014-06-22 NOTE — Assessment & Plan Note (Signed)
Prior cholesterol above goal. Recommended that her diabetes control

## 2014-06-22 NOTE — Assessment & Plan Note (Signed)
Previous creatinine 1.54, elevated BUN in the 30s. Possibly secondary to Lasix 40 mg daily. Is currently not on Lasix. Needs follow-up with primary care for routine lab work

## 2014-06-22 NOTE — Assessment & Plan Note (Addendum)
Appears relatively euvolemic on today's visit. He is currently not taking Lasix. Appears he stopped possibly by the [facility]. Creatinine was 1.54 in September 2015

## 2014-06-22 NOTE — Assessment & Plan Note (Signed)
He is not checking his sugars. We will check a hemoglobin A1c today. Based on this number, we'll make referrals to dietary or endocrine or back to primary care

## 2014-06-23 LAB — HEMOGLOBIN A1C
ESTIMATED AVERAGE GLUCOSE: 180 mg/dL
Hgb A1c MFr Bld: 7.9 % — ABNORMAL HIGH (ref 4.8–5.6)

## 2014-07-14 ENCOUNTER — Telehealth: Payer: Self-pay | Admitting: *Deleted

## 2014-07-14 NOTE — Telephone Encounter (Signed)
-----   Message from Hannah BeatSpencer Copland, MD sent at 07/13/2014  5:04 PM EDT ----- Orvilla Fusommy is overdue for his f/u and medicare physical - can you help

## 2014-07-14 NOTE — Telephone Encounter (Signed)
Please call and schedule Medicare Wellness with Dr. Patsy Lageropland in a 30 minute slot.

## 2014-08-01 NOTE — H&P (Signed)
PATIENT NAME:  Timothy Cordova, Timothy Cordova MR#:  [id number] DATE OF BIRTH:  05/31/1928  DATE OF ADMISSION:  04/26/2013  PRIMARY CARE PHYSICIAN:  Dr. Dallas Schimke.  REFERRING PHYSICIAN:  Dr. Scotty Court.   CHIEF COMPLAINT:  Shortness of breath and chest pain on and off for 4 weeks.   HISTORY OF PRESENT ILLNESS:  This is an 81 year old Caucasian male with a history of hypertension, diabetes, CAD, COPD, presented in the ED with shortness of breath, chest pain for 4 weeks. The patient is alert, awake, oriented, in no acute distress. The patient has had shortness of breath for the past 4 weeks. The symptoms had been worsening. The patient said he is congested and unable cough. In addition, the patient has leg edema. The patient also complains of chest pain, which is on the left side, intermittent, no radiation, 6/10. The patient was planned to have cardiac cath last week, but it was postponed. The patient said that he was off Lasix for 2 weeks due to renal function. The patient denies any orthopnea, nocturnal dyspnea, but has leg edema. Denies any other symptoms. Dr. Scotty Court, ED physician, discussed with Dr. Kirke Corin. We will admit the patient for heart failure and acute CHF. The patient's BNP is more than 4000.   PAST MEDICAL HISTORY:  1.  Hypertension.  2.  Diabetes.  3.  CAD.  4.  COPD.   PAST SURGICAL HISTORY:  CABG.   SOCIAL HISTORY:  No smoking, alcohol or illicit drugs.   FAMILY HISTORY:  Mother had a heart attack, heart failure, hypertension, diabetes in his family.   ALLERGIES:  ACE INHIBITOR, ATORVASTATIN, METFORMIN, NARCOTIC, TICLOPIDINE, TRAMADOL, WARFARIN.   MEDICATIONS: 1.  Zocor 40 mg p.o. at bedtime.  2.  Symbicort 160 mcg/4.5 mcg inhalation 2 puffs b.i.d.  3.  Onglyza 2.5 mg p.o. once a day.  4.  Losartan 50 mg p.o. daily.  5.  Lasix 40 mg p.o. once a day, but off Lasix for 2 weeks.  6.  Glipizide XL 10 mg p.o. daily.  7.  Cardura 4 mg p.o. at bedtime.  8.  Bystolic 5 mg p.o. daily.  9.   Aspirin 81 mg p.o. daily.  10.  Albuterol CFC 90 mcg inhalation 2 puffs 4 times a day p.r.n.   REVIEW OF SYSTEMS:  CONSTITUTIONAL:  Denies any fever or chills. No headache or dizziness, but has weakness and lost weight for several pounds.  EYES:  No double vision or blurred vision.  ENT:  No postnasal drip, slurred speech or dysphagia.  CARDIOVASCULAR:  Positive for chest pain. No palpitations. No orthopnea, nocturnal dyspnea, but has leg edema.  PULMONARY:  Positive for cough, no sputum, no hemoptysis or wheezing. Positive for shortness of breath.  GASTROINTESTINAL:  No abdominal pain, nausea, vomiting, diarrhea.  GENITOURINARY:  No dysuria, hematuria or incontinence.  SKIN:  No rash or jaundice.  NEUROLOGY:  No syncope, loss of consciousness or seizure.  HEMATOLOGY:  No easy bruising or bleeding.  ENDOCRINE:  No polyuria, polydipsia, heat or cold intolerance.   PHYSICAL EXAMINATION:  VITAL SIGNS:  Temperature 98.1, blood pressure 164/73, pulse 73, respirations 24, O2 saturation is 96% on room air.  GENERAL:  Alert, awake, oriented, in no acute distress.  HEENT:  Pupils round, equal and reactive to light and accommodation. Moist oral mucosa. Clear oropharynx.  NECK:  Supple. No JVD or carotid bruits. No lymphadenopathy. No thyromegaly.  CARDIOVASCULAR:  S1, S2, regular rate, rhythm. No murmurs or gallops.  PULMONARY:  Bilateral air entry.  Bilateral basilar rales. No wheezing, no use of accessory muscle to breathe.  ABDOMEN:  Soft. No distention or tenderness, obese. No organomegaly. Bowel sounds present.  EXTREMITIES:  Bilateral leg edema, 1 to 2+. No clubbing or cyanosis. Mild bilateral calf tenderness.  SKIN:  No rash or jaundice.  NEUROLOGY:  A and O x 3. No focal deficit. Power 5/5. Sensation intact.   LABORATORY DATA:  CHEST X-RAY:  No significant abnormality.   Troponin less than 0.02. WBC 14.2, hemoglobin 12.3, platelets 139. Glucose 110, BUN 32, creatinine 1.5, sodium 134,  potassium 4.7, chloride 105, bicarb 26, albumin 2.6. BNP 4155.   EKG shows sinus rhythm with frequent PVCs at 89 bpm with right bundle branch block.   IMPRESSIONS:  1.  Acute congestive heart failure.  2.  Acute renal failure or chronic kidney disease. 3.  Coronary artery disease.  4.  Hypertension.  5.  Diabetes.  6.  Chronic obstructive pulmonary disease. 7. Obesity.   PLAN OF TREATMENT:  1.  The patient will be admitted to telemetry floor. We will continue O2 by nasal cannula, start congestive heart failure protocol. Start Lasix 40 mg IV b.i.d. Get echocardiogram, follow up with Dr. Kirke CorinArida. According to Dr. Scotty CourtStafford, Dr. Kirke CorinArida planned to get a cardiac cath this coming Monday.  2.  We will follow up troponin level, lipid panel, TSH,  3.  We will continue aspirin, Zocor and hypertension medication.  4.  For diabetes, we will start a sliding scale and check hemoglobin A1c, but hold p.o. medication.  5.  For chronic obstructive pulmonary disease, we will continue nebulizer treatments.  6.  I discussed the patient's condition and plan of treatment with the patient and the patient's wife.  CODE STATUS:  The patient wants FULL CODE.   TIME SPENT:  About 56 minutes.   ____________________________ Shaune PollackQing Harvest Deist, MD qc:jm D: 04/26/2013 14:22:25 ET T: 04/26/2013 15:00:07 ET JOB#: [id number]  cc: Shaune PollackQing Brayden Brodhead, MD, <Dictator> Shaune PollackQING Shoshanna Mcquitty MD ELECTRONICALLY SIGNED 04/27/2013 10:31

## 2014-08-01 NOTE — Consult Note (Signed)
General Aspect 81 yo with history of CAD s/p CABG in 2006, history of diastolic CHF, severe COPD with 50+ year smoking history , diabetes ,  presented with worsening dyspnea. .   CORONARY ARTERY DISEASE: s/p CABG 2006.  LHC (1/10): SVG-RCA, SVG-OM and PLOM, SVG-D, and LIMA-LAD were all patent.    Mild chronic renal disease.  he was seen recently by Dr. Mariah Milling for worsening dyspnea over last few months.  He denies any significant chest pain, only extreme exhaustion with any exertion even around the house. He was  diagnosed with the flu few weeks ago but recomvered from that.  Reports that he was previously active over the summer, gardening, since then has not been very active secondary to shortness of breath.  A cardiac cath was scheduled but then postponed due to schedule. The patient flet worse and came to the ED. BNP was elevated. Lasix was hold for cath. He was also evaluated by pulmonary and was told that his COPD did not worsen.   Physical Exam:  GEN no acute distress   NECK supple   RESP normal resp effort  rhonchi   CARD Regular rate and rhythm  No murmur   ABD denies tenderness   EXTR negative cyanosis/clubbing, negative edema   PSYCH alert, A+O to time, place, person   Review of Systems:  Subjective/Chief Complaint dyspnea   General: Fatigue   Skin: No Complaints   ENT: No Complaints   Eyes: No Complaints   Neck: No Complaints   Respiratory: Frequent cough  Short of breath   Cardiovascular: Dyspnea   Gastrointestinal: No Complaints   Genitourinary: No Complaints   Vascular: No Complaints   Musculoskeletal: No Complaints   Neurologic: No Complaints   Hematologic: No Complaints   Endocrine: No Complaints   Psychiatric: No Complaints   Review of Systems: All other systems were reviewed and found to be negative     CAD:    hypertension:    diabetic:    copd:    cardiac cath with stents:   Home Medications: Medication Instructions Status   Onglyza 2.5 mg oral tablet 1 tab(s) orally once a day Active  Lasix 40 mg oral tablet 1 tab(s) orally once a day Active  Zocor 40 mg oral tablet 1 tab(s) orally once a day (at bedtime) Active  Bystolic 5 mg oral tablet 1 tab(s) orally once a day Active  losartan 50 mg oral tablet 1 tab(s) orally once a day Active  GlipiZIDE XL 10 mg oral tablet, extended release 1 tab(s) orally once a day Active  Cardura 4 mg oral tablet 1 tab(s) orally once a day (at bedtime) Active  Symbicort 160 mcg-4.5 mcg/inh inhalation aerosol 2 puff(s) inhaled 2 times a day Active  Aspirin Enteric Coated 81 mg oral delayed release tablet 1 tab(s) orally once a day Active  albuterol CFC free 90 mcg/inh inhalation aerosol 2 puff(s) inhaled 4 times a day, As Needed Active   Lab Results: Thyroid:  18-Jan-15 06:01   Thyroid Stimulating Hormone 0.846 (0.45-4.50 (International Unit)  ----------------------- Pregnant patients have  different reference  ranges for TSH:  - - - - - - - - - -  Pregnant, first trimetser:  0.36 - 2.50 uIU/mL)  LabObservation:  18-Jan-15 09:50   OBSERVATION Reason for Test  Cardiology:  18-Jan-15 09:50   Echo Doppler REASON FOR EXAM:     COMMENTS:     PROCEDURE: [facility] - ECHO DOPPLER COMPLETE(TRANSTHOR)  - Apr 27 2013  9:50AM   RESULT: Echocardiogram Report  Patient Name:   Timothy Cordova Date of Exam: 04/27/2013 Medical Rec #:  [id number]           Custom1: Date of Birth:  1928-12-19        Height:       68.0 in Patient Age:    65 years         Weight:       182.0 lb Patient Gender: M                BSA:          1.96 m??  Indications: CHF Sonographer:    Sherrie Sport RDCS Referring Phys: Demetrios Loll  Summary:  1. Left ventricular ejection fraction, by visual estimation, is 50 to  55%.  2. Low normal global left ventricular systolic function.  3. Impaired relaxation pattern of LV diastolic filling.  4. Mild concentric left ventricular hypertrophy.  5. Mildly dilated left atrium.   6. Mildly dilated right atrium.  7. Mild to moderate aortic valve sclerosis/calcification without any  evidence of aortic stenosis. 2D AND M-MODE MEASUREMENTS (normal ranges within parentheses): Left Ventricle:          Normal IVSd (2D):      1.34 cm (0.7-1.1) LVPWd (2D):     1.41 cm (0.7-1.1) Aorta/LA:                  Normal LVIDd (2D):     4.30 cm (3.4-5.7) Aortic Root (2D): 3.10 cm (2.4-3.7) LVIDs (2D):     2.61 cm           Left Atrium (2D): 4.00 cm (1.9-4.0) LV FS (2D):     39.3 %   (>25%) LV EF (2D):     70.1 %   (>50%)                                   Right Ventricle:                                   RVd (2D):        0.45 cm LV DIASTOLIC FUNCTION: MV Peak E: 0.48 m/s E/e' Ratio: 10.00 MV Peak A: 0.81 m/s Decel Time: 148 msec E/A Ratio: 0.59 SPECTRAL DOPPLER ANALYSIS (where applicable): Mitral Valve: MV P1/2 Time: 42.92 msec MV Area, PHT: 5.13 cm?? Aortic Valve: AoV Max Vel: 1.04 m/s AoV Peak PG: 4.4 mmHg AoV Mean PG: LVOT Vmax: 0.74 m/s LVOT VTI:  LVOT Diameter: 2.10 cm AoV Area, Vmax: 2.47 cm?? AoV Area, VTI:  AoV Area, Vmn: Tricuspid Valve and PA/RV Systolic Pressure: TR Max Velocity: 1.97 m/s RA  Pressure: 5 mmHg RVSP/PASP: 20.5 mmHg Pulmonic Valve: PV Max Velocity: 0.77 m/s PV Max PG: 2.4 mmHg PV Mean PG:  PHYSICIAN INTERPRETATION: Left Ventricle: The left ventricular internal cavity size was normal.  Mild concentric left ventricular hypertrophy. Global LV systolic function  was low normal. Left ventricular ejection fraction, by visual estimation,  is 50 to 55%. Spectral Doppler shows impaired relaxation pattern of LV  diastolic filling. Right Ventricle: Normal right ventricular size, wall thickness, and   systolic function. Left Atrium: The left atrium is mildly dilated. Right Atrium: The right atrium is mildly dilated. Pericardium: There is no evidence of pericardial effusion. Mitral Valve: No evidence of mitral valve stenosis.  Trace mitral valve   regurgitation is seen. Tricuspid Valve: The tricuspid valve is normal. Trivial tricuspid  regurgitation is visualized. The tricuspid regurgitant velocity is 1.97  m/s, and with an assumed right atrial pressure of 5 mmHg, the estimated  right ventricular systolic pressure is normal at 20.5 mmHg. Aortic Valve: The aortic valve is normal. Mild to moderate aortic valve  sclerosis/calcification is present, without any evidence of aortic  stenosis. No evidence of aortic valve regurgitation is seen. Pulmonic Valve: The pulmonicvalve is not well seen. Trace pulmonic valve  regurgitation. Aorta: The aorta is not well seen. Venous: The inferior vena cava was not well visualized.  [id number] Lorine Bears MD Electronically signed by [id number] Lorine Bears MD Signature Date/Time: 04/27/2013/11:36:24 AM  *** Final ***  IMPRESSION: .    Verified By: Chelsea Aus. ARIDA, M.D., MD  Routine Chem:  18-Jan-15 06:01   Glucose, Serum  48  BUN  36  Creatinine (comp)  1.41  Sodium, Serum 139  Potassium, Serum 3.9  Chloride, Serum 102  CO2, Serum 31  Calcium (Total), Serum 9.2  Anion Gap  6  Osmolality (calc) 283  eGFR (African American)  53  eGFR (Non-African American)  46 (eGFR values <62mL/min/1.73 m2 may be an indication of chronic kidney disease (CKD). Calculated eGFR is useful in patients with stable renal function. The eGFR calculation will not be reliable in acutely ill patients when serum creatinine is changing rapidly. It is not useful in  patients on dialysis. The eGFR calculation may not be applicable to patients at the low and high extremes of body sizes, pregnant women, and vegetarians.)  Magnesium, Serum 1.8 (1.8-2.4 THERAPEUTIC RANGE: 4-7 mg/dL TOXIC: > 10 mg/dL  -----------------------)  Hemoglobin A1c ([facility])  8.0 (The American Diabetes Association recommends that a primary goal of therapy should be <7% and that physicians should reevaluate the treatment regimen in patients with  HbA1c values consistently >8%.)  Cholesterol, Serum 155  Triglycerides, Serum 120  HDL (INHOUSE)  69  VLDL Cholesterol Calculated 24  LDL Cholesterol Calculated 62 (Result(s) reported on 27 Apr 2013 at 07:25AM.)   EKG:  EKG NSR   Interpretation frequent PVC, RBBB   Radiology Results: XRay:    17-Jan-15 12:11, Chest Portable Single View  Chest Portable Single View   REASON FOR EXAM:    chest pain  COMMENTS:       PROCEDURE: DXR - DXR PORTABLE CHEST SINGLE VIEW  - Apr 26 2013 12:11PM     CLINICAL DATA:  Chest pain    EXAM:  PORTABLE CHEST - 1 VIEW    COMPARISON:  04/17/2013    FINDINGS:  Mild cardiac enlargement.Status post CABG. Vascular pattern normal.  Lungs clear except for mild bilateral lower lobe atelectasis.   IMPRESSION:  No significant abnormalities      Electronically Signed    By: Esperanza Heir M.D.    On: 04/26/2013 12:11         Verified By: Otilio Carpen, M.D.,  Cardiology:    18-Jan-15 09:50, Echo Doppler  Echo Doppler   REASON FOR EXAM:      COMMENTS:       PROCEDURE: [facility] - ECHO DOPPLER COMPLETE(TRANSTHOR)  - Apr 27 2013  9:50AM     RESULT: Echocardiogram Report    Patient Name:   DONDRELL LOUDERMILK Date of Exam: 04/27/2013  Medical Rec #:  [id number]           Custom1:  Date of Birth:  [birth year]  Height:       68.0 in  Patient Age:    86 years         Weight:       182.0 lb  Patient Gender: M                BSA:          1.96 m??    Indications: CHF  Sonographer:    Sherrie Sport RDCS  Referring Phys: Demetrios Loll    Summary:   1. Left ventricular ejection fraction, by visual estimation, is 50 to   55%.   2. Low normal global left ventricular systolic function.   3. Impaired relaxation pattern of LV diastolic filling.   4. Mild concentric left ventricular hypertrophy.   5. Mildly dilated left atrium.   6. Mildly dilated right atrium.   7. Mild to moderate aortic valve sclerosis/calcification without any   evidence of aortic  stenosis.  2D AND M-MODE MEASUREMENTS (normal ranges within parentheses):  Left Ventricle:          Normal  IVSd (2D):      1.34 cm (0.7-1.1)  LVPWd (2D):     1.41 cm (0.7-1.1) Aorta/LA:                  Normal  LVIDd (2D):     4.30 cm (3.4-5.7) Aortic Root (2D): 3.10 cm (2.4-3.7)  LVIDs (2D):     2.61 cm           Left Atrium (2D): 4.00 cm (1.9-4.0)  LV FS (2D):     39.3 %   (>25%)  LV EF (2D):     70.1 %   (>50%)                                    Right Ventricle:                                    RVd (2D):        4.09 cm  LV DIASTOLIC FUNCTION:  MV Peak E: 0.48 m/s E/e' Ratio: 10.00  MV Peak A: 0.81 m/s Decel Time: 148 msec  E/A Ratio: 0.59  SPECTRAL DOPPLER ANALYSIS (where applicable):  Mitral Valve:  MV P1/2 Time: 42.92 msec  MV Area, PHT: 5.13 cm??  Aortic Valve: AoV Max Vel: 1.04 m/s AoV Peak PG: 4.4 mmHg AoV Mean PG:  LVOT Vmax: 0.74 m/s LVOT VTI:  LVOT Diameter: 2.10 cm  AoV Area, Vmax: 2.47 cm?? AoV Area, VTI:  AoV Area, Vmn:  Tricuspid Valve and PA/RV Systolic Pressure: TR Max Velocity: 1.97 m/s RA   Pressure: 5 mmHg RVSP/PASP: 20.5 mmHg  Pulmonic Valve:  PV Max Velocity: 0.77 m/s PV Max PG: 2.4 mmHg PV Mean PG:    PHYSICIAN INTERPRETATION:  Left Ventricle: The left ventricular internal cavity size was normal.   Mild concentric left ventricular hypertrophy. Global LV systolic function   was low normal. Left ventricular ejection fraction, by visual estimation,   is 50 to 55%. Spectral Doppler shows impaired relaxation pattern of LV   diastolic filling.  Right Ventricle: Normal right ventricular size, wall thickness, and     systolic function.  Left Atrium: The left atrium is mildly dilated.  Right Atrium: The right atrium is mildly dilated.  Pericardium: There is no evidence of  pericardial effusion.  Mitral Valve: No evidence of mitral valve stenosis. Trace mitral valve   regurgitation is seen.  Tricuspid Valve: The tricuspid valve is normal. Trivial tricuspid    regurgitation is visualized. The tricuspid regurgitant velocity is 1.97   m/s, and with an assumed right atrial pressure of 5 mmHg, the estimated   right ventricular systolic pressure is normal at 20.5 mmHg.  Aortic Valve: The aortic valve is normal. Mild to moderate aortic valve   sclerosis/calcification is present, without any evidence of aortic   stenosis. No evidence of aortic valve regurgitation is seen.  Pulmonic Valve: The pulmonicvalve is not well seen. Trace pulmonic valve   regurgitation.  Aorta: The aorta is not well seen.  Venous: The inferior vena cava was not well visualized.    [id number] Kathlyn Sacramento MD  Electronically signed by [id number] Kathlyn Sacramento MD  Signature Date/Time: 04/27/2013/11:36:24 AM    *** Final ***    IMPRESSION: .        Verified By: Mertie Clause. ARIDA, M.D., MD    Metformin: Rash  Tramadol: Rash  Ace Inhibitors: Rash  atorvastatin: Rash  ticlopidine: Other  Warfarin: Other  Narcotic Analgesics: Other  Vital Signs/Nurse's Notes: **Vital Signs.:   18-Jan-15 11:57  Vital Signs Type Routine  Temperature Temperature (F) 98.5  Celsius 36.9  Temperature Source oral  Pulse Pulse 73  Respirations Respirations 20  Systolic BP Systolic BP 814  Diastolic BP (mmHg) Diastolic BP (mmHg) 45  Mean BP 64  Pulse Ox % Pulse Ox % 91  Pulse Ox Activity Level  At rest  Oxygen Delivery Room Air/ 21 %    Impression 1. Possible unstable angina: progressive worsening of exertional dyspnea becoming at rest could be angina equivalent. Known history of CAD with previous CABG. Cath was already planned.  Will proceed with cardiac cath tomorrow. Risks and benefits were explained.  Hold Lasix to minimize risk of contrast induced nephropathy.   2. Acute on chronic diastolic heart failure: Dyspnea might be multifactorial due to diasolic heart failure, COPD and recent flu. Check LVEDP during cath.   3. CKD: seems stable.   Electronic Signatures: Kathlyn Sacramento (MD)   (Signed 18-Jan-15 13:41)  Authored: General Aspect/Present Illness, History and Physical Exam, Review of System, Past Medical History, Home Medications, Labs, EKG , Radiology, Allergies, Vital Signs/Nurse's Notes, Impression/Plan   Last Updated: 18-Jan-15 13:41 by Kathlyn Sacramento (MD)

## 2014-08-01 NOTE — Discharge Summary (Signed)
PATIENT NAME:  Timothy Cordova, Timothy Cordova MR#:  [id number] DATE OF BIRTH:  Feb 24, 1929  DATE OF ADMISSION:  04/26/2013 DATE OF DISCHARGE:  05/02/2013  PRIMARY CARE PROVIDER:  Dr. Karleen Hampshire Copland.   DISCHARGE DIAGNOSES: 1.  Acute chronic obstructive pulmonary disease exacerbation.  2.  Acute on chronic diastolic congestive heart failure.  3.  Acute respiratory failure.  4.  Acute renal failure over chronic kidney disease stage 3.   CONSULTANTS:  1.  Dr. Kirke Corin of cardiology.  2.  Dr. Belia Heman with pulmonary.   IMAGING STUDIES:  Include:  1.  Chest x-ray, portable, which showed no significant abnormalities.  Possibly mild vascular congestion.  2.  Echocardiogram showed EF of 50% to 55%, low-normal global left ventricular function, impaired relaxation pattern of the LV diastolic filling.   PROCEDURES:  Cardiac catheterization done by Dr. Kirke Corin showed severe three-vessel coronary artery disease with patent grafts including LIMA to LAD, SVG to RPDA, SVG to OM and SVG to diagonal.  Patent left subclavian artery stent.  Also, left ventricular end diastolic pressure was only mildly elevated.   ADMITTING HISTORY, PHYSICAL AND HOSPITAL COURSE:  Please see detailed H and P dictated previously by Dr. Imogene Burn.  In brief, an 81 year old Caucasian male patient with history of coronary artery disease, CABG, COPD presented to the hospital complaining of shortness of breath and chest pain on and off for four weeks.  The patient was admitted to the hospitalist service after discussing with Dr. Kirke Corin about getting a cardiac catheterization.  The patient was started on IV Lasix.  The patient did respond mildly to the Lasix, but his creatinine worsened over his CKD stage 3, after which Lasix was stopped.  The patient had a cardiac catheterization once the creatinine improved.  It did not show any significant stenosis.  Left ventricular end-diastolic pressure was only mildly elevated ruling out congestive heart failure.  The patient  was after this seen by Dr. Belia Heman.  He was started on IV steroids, antibiotics, Spiriva along with DuoNeb with which he improved well.  By the day of discharge the patient has improved significantly, but is needing oxygen on ambulation with sats of 86% on room air and is being discharged home on home oxygen to follow up with his pulmonologist and primary care physician.   Today on examination, the patient has minimal wheezing.  Good air entry on both sides.  Motor strength 5 by 5 in upper and lower extremities.   DISCHARGE MEDICATIONS:  Include:  1.  Albuterol 2 puffs inhaled 4 times a day as needed.  2.  Aspirin 81 mg daily.  3.  Symbicort 160/4.5 mg 2 puffs inhaled 2 times a day.  4.  Cardura 4 mg oral once a day.  5.  Glipizide XL 10 mg oral once a day.  6.  Losartan 50 mg daily.  7.  Bystolic 5 mg oral once a day.  8.  Zocor 40 mg daily.  9.  Lasix 40 mg oral once a day.  10.  Onglyza 2.5 mg oral once a day.  11.  Spiriva 18 mcg inhaled once a day.  12.  Imdur 30 mg extended release once a day.  13.  DuoNeb 3 mL inhaled 4 times a day as needed.  14.  Prednisone 60 mg tapered over 12 days.  15.  Levaquin 250 mg daily for five days.   DISCHARGE INSTRUCTIONS:  Continue home oxygen at 2 liters 24 hours.  Low-sodium, carbohydrate-controlled diet.  Activity as tolerated.  Follow  up with Dr. Meredeth IdeFleming and primary care physician in 1 to 2 weeks.   Time spent on day of discharge in discharge activity was 44 minutes.    ____________________________  srs:ea D: 05/03/2013 17:18:37 ET T: 05/04/2013 00:03:30 ET JOB#: [id number]  cc: Wardell HeathSrikar R. Nolen Lindamood, MD, <Dictator> Dr. Delila SpenceFleming Spencer Copland, MD  Orie FishermanSRIKAR R Diara Chaudhari MD ELECTRONICALLY SIGNED 05/06/2013 10:19

## 2014-08-19 ENCOUNTER — Encounter: Payer: Self-pay | Admitting: Family Medicine

## 2014-08-19 ENCOUNTER — Ambulatory Visit (INDEPENDENT_AMBULATORY_CARE_PROVIDER_SITE_OTHER): Payer: Medicare Other | Admitting: Family Medicine

## 2014-08-19 VITALS — BP 120/60 | HR 75 | Temp 97.4°F | Ht 67.5 in | Wt 188.5 lb

## 2014-08-19 DIAGNOSIS — E1122 Type 2 diabetes mellitus with diabetic chronic kidney disease: Secondary | ICD-10-CM | POA: Diagnosis not present

## 2014-08-19 DIAGNOSIS — E782 Mixed hyperlipidemia: Secondary | ICD-10-CM

## 2014-08-19 DIAGNOSIS — Z23 Encounter for immunization: Secondary | ICD-10-CM

## 2014-08-19 DIAGNOSIS — N189 Chronic kidney disease, unspecified: Secondary | ICD-10-CM

## 2014-08-19 DIAGNOSIS — N183 Chronic kidney disease, stage 3 unspecified: Secondary | ICD-10-CM

## 2014-08-19 DIAGNOSIS — Z79899 Other long term (current) drug therapy: Secondary | ICD-10-CM | POA: Diagnosis not present

## 2014-08-19 LAB — CBC WITH DIFFERENTIAL/PLATELET
Basophils Absolute: 0 10*3/uL (ref 0.0–0.1)
Basophils Relative: 0.4 % (ref 0.0–3.0)
EOS PCT: 2.2 % (ref 0.0–5.0)
Eosinophils Absolute: 0.2 10*3/uL (ref 0.0–0.7)
HCT: 37.7 % — ABNORMAL LOW (ref 39.0–52.0)
Hemoglobin: 12.6 g/dL — ABNORMAL LOW (ref 13.0–17.0)
LYMPHS PCT: 19.4 % (ref 12.0–46.0)
Lymphs Abs: 1.5 10*3/uL (ref 0.7–4.0)
MCHC: 33.5 g/dL (ref 30.0–36.0)
MCV: 89.7 fl (ref 78.0–100.0)
MONOS PCT: 8.6 % (ref 3.0–12.0)
Monocytes Absolute: 0.7 10*3/uL (ref 0.1–1.0)
NEUTROS PCT: 69.4 % (ref 43.0–77.0)
Neutro Abs: 5.3 10*3/uL (ref 1.4–7.7)
Platelets: 201 10*3/uL (ref 150.0–400.0)
RBC: 4.2 Mil/uL — AB (ref 4.22–5.81)
RDW: 13.6 % (ref 11.5–15.5)
WBC: 7.6 10*3/uL (ref 4.0–10.5)

## 2014-08-19 LAB — HEPATIC FUNCTION PANEL
ALBUMIN: 3.7 g/dL (ref 3.5–5.2)
ALT: 12 U/L (ref 0–53)
AST: 12 U/L (ref 0–37)
Alkaline Phosphatase: 102 U/L (ref 39–117)
Bilirubin, Direct: 0.1 mg/dL (ref 0.0–0.3)
Total Bilirubin: 0.4 mg/dL (ref 0.2–1.2)
Total Protein: 6.7 g/dL (ref 6.0–8.3)

## 2014-08-19 LAB — BASIC METABOLIC PANEL
BUN: 36 mg/dL — AB (ref 6–23)
CALCIUM: 9.5 mg/dL (ref 8.4–10.5)
CHLORIDE: 105 meq/L (ref 96–112)
CO2: 28 mEq/L (ref 19–32)
CREATININE: 1.46 mg/dL (ref 0.40–1.50)
GFR: 48.85 mL/min — ABNORMAL LOW (ref >60.00)
Glucose, Bld: 173 mg/dL — ABNORMAL HIGH (ref 70–99)
Potassium: 5.1 mEq/L (ref 3.5–5.1)
Sodium: 137 mEq/L (ref 135–145)

## 2014-08-19 LAB — LIPID PANEL
CHOL/HDL RATIO: 4
Cholesterol: 130 mg/dL (ref 0–200)
HDL: 31.9 mg/dL — ABNORMAL LOW (ref >39.00)
LDL CALC: 60 mg/dL (ref 0–99)
NonHDL: 98.1
TRIGLYCERIDES: 189 mg/dL — AB (ref 0.0–149.0)
VLDL: 37.8 mg/dL (ref 0.0–40.0)

## 2014-08-19 MED ORDER — GLIPIZIDE ER 10 MG PO TB24: 20.0000 mg | ORAL_TABLET | Freq: Every day | ORAL | Status: DC

## 2014-08-19 NOTE — Progress Notes (Signed)
Pre visit review using our clinic review tool, if applicable. No additional management support is needed unless otherwise documented below in the visit note. 

## 2014-08-19 NOTE — Progress Notes (Signed)
Dr. Karleen HampshireSpencer T. Leonidus Rowand, MD, CAQ Sports Medicine Primary [facility] and Sports Medicine [address] [place], [address] Phone: [phone] Fax: [phone]  08/19/2014  Patient: Timothy Cordova, MRN: [id number], DOB: June 03, 1928, 81 y.o.  Primary Physician:  Hannah BeatSpencer Anaiz Qazi, MD  Chief Complaint: Medication Refill  Subjective:   Timothy Cordova is a 81 y.o. very pleasant male patient who presents with the following:  Glipizide  Diabetes Mellitus: Tolerating Medications: yes Compliance with diet: fair Exercise: minimal / intermittent Avg blood sugars at home: not checking Foot problems: none Hypoglycemia: none No nausea, vomitting, blurred vision, polyuria.  Lab Results  Component Value Date   HGBA1C 7.9* 06/22/2014   HGBA1C 6.6* 09/20/2011   HGBA1C 7.3* 08/12/2009   Lab Results  Component Value Date   MICROALBUR 2.0* 09/20/2011   LDLCALC 60 08/19/2014   CREATININE 1.46 08/19/2014    Wt Readings from Last 3 Encounters:  08/19/14 188 lb 8 oz (85.503 kg)  06/22/14 190 lb (86.183 kg)  01/02/14 189 lb 12 oz (86.07 kg)    Body mass index is 29.07 kg/(m^2).   HTN: Tolerating all medications without side effects Stable and at goal No CP, no sob. No HA.  BP Readings from Last 3 Encounters:  08/19/14 120/60  06/22/14 140/72  01/02/14 112/68    Basic Metabolic Panel:    Component Value Date/Time   NA 137 08/19/2014 1516   NA 133* 05/01/2013 0457   NA 140 04/23/2013 1229   K 5.1 08/19/2014 1516   K 4.7 05/01/2013 0457   CL 105 08/19/2014 1516   CL 101 05/01/2013 0457   CO2 28 08/19/2014 1516   CO2 29 05/01/2013 0457   BUN 36* 08/19/2014 1516   BUN 41* 05/01/2013 0457   BUN 24 04/23/2013 1229   CREATININE 1.46 08/19/2014 1516   CREATININE 1.57* 05/01/2013 0457   GLUCOSE 173* 08/19/2014 1516   GLUCOSE 274* 05/01/2013 0457   GLUCOSE 195* 04/23/2013 1229   CALCIUM 9.5 08/19/2014 1516   CALCIUM 8.9 05/01/2013 0457   Lipids: Doing well, stable.  Tolerating meds fine with no SE. Panel reviewed with patient.  Lipids:    Component Value Date/Time   CHOL 130 08/19/2014 1516   CHOL 190 04/23/2013 1227   TRIG 189.0* 08/19/2014 1516   HDL 31.90* 08/19/2014 1516   HDL 63 04/23/2013 1227   VLDL 37.8 08/19/2014 1516   CHOLHDL 4 08/19/2014 1516    Lab Results  Component Value Date   ALT 12 08/19/2014   AST 12 08/19/2014   ALKPHOS 102 08/19/2014   BILITOT 0.4 08/19/2014     Breathing is worse.   Past Medical History, Surgical History, Social History, Family History, Problem List, Medications, and Allergies have been reviewed and updated if relevant.   GEN: No acute illnesses, no fevers, chills. GI: No n/v/d, eating normally Pulm: No SOB Interactive and getting along well at home.  Otherwise, ROS is as per the HPI.  Objective:   BP 120/60 mmHg  Pulse 75  Temp(Src) 97.4 F (36.3 C) (Oral)  Ht 5' 7.5" (1.715 m)  Wt 188 lb 8 oz (85.503 kg)  BMI 29.07 kg/m2  GEN: WDWN, NAD, Non-toxic, A & O x 3 HEENT: Atraumatic, Normocephalic. Neck supple. No masses, No LAD. Ears and Nose: No external deformity. CV: RRR, No M/G/R. No JVD. No thrill. No extra heart sounds. PULM: CTA B, no wheezes, crackles, rhonchi. No retractions. No resp. distress. No accessory muscle use. EXTR: No c/c/e  NEURO Normal gait.  PSYCH: Normally interactive. Conversant. Not depressed or anxious appearing.  Calm demeanor.   Laboratory and Imaging Data: Results for orders placed or performed in visit on 08/19/14  Lipid panel  Result Value Ref Range   Cholesterol 130 0 - 200 mg/dL   Triglycerides 657.8189.0 (H) 0.0 - 149.0 mg/dL   HDL 46.9631.90 (L) >29.52>39.00 mg/dL   VLDL 84.137.8 0.0 - 32.440.0 mg/dL   LDL Cholesterol 60 0 - 99 mg/dL   Total CHOL/HDL Ratio 4    NonHDL 98.10   Basic metabolic panel  Result Value Ref Range   Sodium 137 135 - 145 mEq/L   Potassium 5.1 3.5 - 5.1 mEq/L   Chloride 105 96 - 112 mEq/L   CO2 28 19 - 32 mEq/L   Glucose, Bld 173 (H) 70 - 99  mg/dL   BUN 36 (H) 6 - 23 mg/dL   Creatinine, Ser 4.011.46 0.40 - 1.50 mg/dL   Calcium 9.5 8.4 - 02.710.5 mg/dL   GFR 25.3648.85 (L) >64.40>60.00 mL/min  CBC with Differential/Platelet  Result Value Ref Range   WBC 7.6 4.0 - 10.5 K/uL   RBC 4.20 (L) 4.22 - 5.81 Mil/uL   Hemoglobin 12.6 (L) 13.0 - 17.0 g/dL   HCT 34.737.7 (L) 42.539.0 - 95.652.0 %   MCV 89.7 78.0 - 100.0 fl   MCHC 33.5 30.0 - 36.0 g/dL   RDW 38.713.6 56.411.5 - 33.215.5 %   Platelets 201.0 150.0 - 400.0 K/uL   Neutrophils Relative % 69.4 43.0 - 77.0 %   Lymphocytes Relative 19.4 12.0 - 46.0 %   Monocytes Relative 8.6 3.0 - 12.0 %   Eosinophils Relative 2.2 0.0 - 5.0 %   Basophils Relative 0.4 0.0 - 3.0 %   Neutro Abs 5.3 1.4 - 7.7 K/uL   Lymphs Abs 1.5 0.7 - 4.0 K/uL   Monocytes Absolute 0.7 0.1 - 1.0 K/uL   Eosinophils Absolute 0.2 0.0 - 0.7 K/uL   Basophils Absolute 0.0 0.0 - 0.1 K/uL  Hepatic function panel  Result Value Ref Range   Total Bilirubin 0.4 0.2 - 1.2 mg/dL   Bilirubin, Direct 0.1 0.0 - 0.3 mg/dL   Alkaline Phosphatase 102 39 - 117 U/L   AST 12 0 - 37 U/L   ALT 12 0 - 53 U/L   Total Protein 6.7 6.0 - 8.3 g/dL   Albumin 3.7 3.5 - 5.2 g/dL     Assessment and Plan:   Type 2 diabetes mellitus with diabetic chronic kidney disease - Plan: Basic metabolic panel  CKD (chronic kidney disease) stage 3, GFR 30-59 ml/min - Plan: Basic metabolic panel  Mixed hyperlipidemia - Plan: Lipid panel  Encounter for long-term (current) use of medications - Plan: CBC with Differential/Platelet, Hepatic function panel  Need for vaccination with 13-polyvalent pneumococcal conjugate vaccine - Plan: Pneumococcal conjugate vaccine 13-valent IM  Doing reasonably well. Check all labs.  Refill the patient's glipizide.  Follow-up: Return in about 6 months (around 02/19/2015).  New Prescriptions   No medications on file   Orders Placed This Encounter  Procedures  . Pneumococcal conjugate vaccine 13-valent IM  . Lipid panel  . Basic metabolic panel  .  CBC with Differential/Platelet  . Hepatic function panel    Signed,  Karleen HampshireSpencer T. Loise Esguerra, MD   Patient's Medications  New Prescriptions   No medications on file  Previous Medications   ACETAMINOPHEN (TYLENOL) 650 MG CR TABLET    Take 1,300 mg by mouth every 8 (eight)  hours as needed for pain.   ALBUTEROL (PROVENTIL HFA;VENTOLIN HFA) 108 (90 BASE) MCG/ACT INHALER    Inhale 2 puffs into the lungs every 6 (six) hours as needed for wheezing.   ASPIRIN 81 MG EC TABLET    Take 81 mg by mouth daily.     ATORVASTATIN (LIPITOR) 80 MG TABLET    Take 40 mg by mouth daily.   BUDESONIDE-FORMOTEROL (SYMBICORT) 160-4.5 MCG/ACT INHALER    Inhale 2 puffs into the lungs 2 (two) times daily.   CHOLECALCIFEROL (VITAMIN D-3 PO)    Take 1,000 Units by mouth daily.    DOXAZOSIN (CARDURA) 4 MG TABLET    TAKE 1 TO 2 TABLETS BY MOUTH ONCE DAILY AS DIRECTED   FUROSEMIDE (LASIX) 20 MG TABLET    Take 20 mg by mouth daily as needed.   IPRATROPIUM-ALBUTEROL (DUONEB) 0.5-2.5 (3) MG/3ML SOLN    Take 3 mLs by nebulization 4 (four) times daily as needed.   ISOSORBIDE MONONITRATE (IMDUR) 30 MG 24 HR TABLET    Take 1 tablet (30 mg total) by mouth daily.   METOPROLOL SUCCINATE (TOPROL-XL) 50 MG 24 HR TABLET    Take 25 mg by mouth daily. Take with or immediately following a meal.   MULTIPLE VITAMINS-MINERALS (PRESERVISION AREDS PO)    Take by mouth daily.   ONE TOUCH ULTRA TEST TEST STRIP    CHECK BLOOD SUGAR TWICE DAILY AS DIRECTED   ONETOUCH DELICA LANCETS 33G MISC       TIOTROPIUM (SPIRIVA) 18 MCG INHALATION CAPSULE    Place 18 mcg into inhaler and inhale daily.  Modified Medications   Modified Medication Previous Medication   GLIPIZIDE (GLIPIZIDE XL) 10 MG 24 HR TABLET GLIPIZIDE XL 10 MG 24 hr tablet      Take 2 tablets (20 mg total) by mouth daily.    TAKE 1 TABLET BY MOUTH DAILY  Discontinued Medications   No medications on file

## 2014-09-23 ENCOUNTER — Ambulatory Visit (INDEPENDENT_AMBULATORY_CARE_PROVIDER_SITE_OTHER): Payer: Medicare Other | Admitting: Family Medicine

## 2014-09-23 ENCOUNTER — Encounter: Payer: Self-pay | Admitting: Family Medicine

## 2014-09-23 VITALS — BP 122/57 | HR 86 | Temp 98.4°F | Ht 67.5 in | Wt 186.2 lb

## 2014-09-23 DIAGNOSIS — J209 Acute bronchitis, unspecified: Secondary | ICD-10-CM | POA: Diagnosis not present

## 2014-09-23 DIAGNOSIS — J441 Chronic obstructive pulmonary disease with (acute) exacerbation: Secondary | ICD-10-CM

## 2014-09-23 MED ORDER — PREDNISONE 20 MG PO TABS: ORAL_TABLET | ORAL | Status: DC

## 2014-09-23 MED ORDER — CLARITHROMYCIN 500 MG PO TABS: 500.0000 mg | ORAL_TABLET | Freq: Two times a day (BID) | ORAL | Status: DC

## 2014-09-23 MED ORDER — METHYLPREDNISOLONE ACETATE 80 MG/ML IJ SUSP
80.0000 mg | Freq: Once | INTRAMUSCULAR | Status: AC
  Administered 2014-09-23: 80 mg via INTRAMUSCULAR

## 2014-09-23 NOTE — Progress Notes (Signed)
Dr. Karleen Hampshire T. Jamariyah Johannsen, MD, CAQ Sports Medicine Primary [facility] and Sports Medicine [address] [place], [address] Phone: [phone] Fax: [phone]  09/23/2014  Patient: Timothy Cordova, MRN: [id number], DOB: 06-Sep-1928, 81 y.o.  Primary Physician:  Hannah Beat, MD  Chief Complaint: Cough; Sore Throat; and Shortness of Breath  Subjective:   Timothy Cordova is a 81 y.o. very pleasant male patient who presents with the following:  Patient known very well with a heavy smoking history who has COPD who presents with several days of significant coughing, some shortness of breath, runny nose, sore throat, and generally not feeling well.  He is currently afebrile.  Pulse ox is 95%.  Few days, SOB. Runny nose, cough. ST.  COPD exac.  Past Medical History, Surgical History, Social History, Family History, Problem List, Medications, and Allergies have been reviewed and updated if relevant.  Patient Active Problem List   Diagnosis Date Noted  . COPD with acute exacerbation 05/21/2013  . Aortic calcification, 04/20/2005 CT Chest 04/03/2013  . Sleep apnea   . History of tobacco abuse   . Chronic low back pain 04/18/2011  . Renal lesion 03/27/2011  . CKD (chronic kidney disease) stage 3, GFR 30-59 ml/min 08/18/2010  . OSTEOARTHRITIS 12/06/2009  . DIASTOLIC HEART FAILURE, CHRONIC 08/18/2009  . Mixed hyperlipidemia 06/11/2008  . CAD, AUTOLOGOUS BYPASS GRAFT 06/07/2008  . FATIGUE 12/17/2007  . DM (diabetes mellitus), type 2 with renal complications 09/04/2007  . DIZZINESS 09/04/2007  . ALLERGIC RHINITIS 01/30/2007  . DEMENTIA 07/06/2006  . HEARING LOSS 07/06/2006  . Essential hypertension 07/06/2006  . BENIGN PROSTATIC HYPERTROPHY 07/06/2006  . COPD 08/08/2005    Past Medical History  Diagnosis Date  . Coronary artery disease     s/p CABG 2006. LHC (1/10): SVG-OM and PLOM, SVG-D, and LIMA-LAD were all patent  . Hypertension   . COPD (chronic obstructive pulmonary  disease)     moderate. followed by Dr.Byrum  . Chronic diastolic heart failure     8/08 ECHO with EF 55%  . Edema     localized. suspect diastolic CHF + venous insufficiency  . Mixed hyperlipidemia   . Sleep apnea     ?  . Diabetes mellitus   . History of tobacco abuse   . Allergic rhinitis   . Hypercholesterolemia   . Benign prostatic hypertrophy   . Osteoarthritis   . Dementia     (mild)--06/2006  . Hearing loss     left >>right  . CKD (chronic kidney disease)     Past Surgical History  Procedure Laterality Date  . Angioplasty  [id number]  . Rotator cuff repair  2005    left, Gioffre  . Coronary artery bypass graft  02/2005  . Cardiac catheterization  90s    [place], [place] x1stent  . Cardiac catheterization      [facility];x2 stents  . Cardiac catheterization  04/29/2013    History   Social History  . Marital Status: Married    Spouse Name: N/A  . Number of Children: N/A  . Years of Education: N/A   Occupational History  . rubber fabrication     RETIRED   Social History Main Topics  . Smoking status: Former Smoker -- 3.00 packs/day for 60 years    Types: Cigarettes    Quit date: 07/12/2005  . Smokeless tobacco: Never Used  . Alcohol Use: No  . Drug Use: No  . Sexual Activity: Not on file   Other Topics  Concern  . Not on file   Social History Narrative    Family History  Problem Relation Age of Onset  . Heart attack Mother   . Lung cancer Sister     Allergies  Allergen Reactions  . Metformin And Related   . Tramadol Hcl     Tremor vs seizure activity  . Ace Inhibitors     REACTION: cough  . Atorvastatin     REACTION: weakness  . Codeine Nausea And Vomiting  . Morphine Sulfate     REACTION: unspecified  . Ticlopidine Hcl     REACTION: unspecified  . Warfarin Sodium Other (See Comments)    Nausea and vomiting     Medication list reviewed and updated in full in [facility].  ROS: GEN: Acute illness details above GI: Tolerating PO  intake GU: maintaining adequate hydration and urination Pulm: + SOB Interactive and getting along well at home.  Otherwise, ROS is as per the HPI.   Objective:   BP 122/57 mmHg  Pulse 86  Temp(Src) 98.4 F (36.9 C) (Oral)  Ht 5' 7.5" (1.715 m)  Wt 186 lb 4 oz (84.482 kg)  BMI 28.72 kg/m2  SpO2 95%   GEN: A and O x 3. WDWN. NAD.    ENT: Nose clear, ext NML.  No LAD.  No JVD.  TM's clear. Oropharynx clear.  PULM: Normal WOB, no distress. No focal crackles, but the patient has diminished breath sounds throughout and scattered wheezing. CV: RRR, no M/G/R, No rubs, No JVD.   EXT: warm and well-perfused, No c/c/e. PSYCH: Pleasant and conversant.    Laboratory and Imaging Data:  Assessment and Plan:   COPD exacerbation - Plan: methylPREDNISolone acetate (DEPO-MEDROL) injection 80 mg  Acute bronchitis, unspecified organism  Depo-Medrol 80 mg in the office IM. Prednisone per below.  More likely viral pathogen as instigator, but given risk and patient's COPD, I'm going to place him on some Biaxin.  Follow-up: prn  New Prescriptions   CLARITHROMYCIN (BIAXIN) 500 MG TABLET    Take 1 tablet (500 mg total) by mouth 2 (two) times daily.   PREDNISONE (DELTASONE) 20 MG TABLET    2 tabs po daily for 5 days, then 1 tab po for 5 days   No orders of the defined types were placed in this encounter.    Signed,  Elpidio Galea. Zacheriah Stumpe, MD   Patient's Medications  New Prescriptions   CLARITHROMYCIN (BIAXIN) 500 MG TABLET    Take 1 tablet (500 mg total) by mouth 2 (two) times daily.   PREDNISONE (DELTASONE) 20 MG TABLET    2 tabs po daily for 5 days, then 1 tab po for 5 days  Previous Medications   ACETAMINOPHEN (TYLENOL) 650 MG CR TABLET    Take 1,300 mg by mouth every 8 (eight) hours as needed for pain.   ALBUTEROL (PROVENTIL HFA;VENTOLIN HFA) 108 (90 BASE) MCG/ACT INHALER    Inhale 2 puffs into the lungs every 6 (six) hours as needed for wheezing.   ASPIRIN 81 MG EC TABLET    Take  81 mg by mouth daily.     ATORVASTATIN (LIPITOR) 80 MG TABLET    Take 40 mg by mouth daily.   BUDESONIDE-FORMOTEROL (SYMBICORT) 160-4.5 MCG/ACT INHALER    Inhale 2 puffs into the lungs 2 (two) times daily.   CHOLECALCIFEROL (VITAMIN D-3 PO)    Take 1,000 Units by mouth daily.    DOXAZOSIN (CARDURA) 4 MG TABLET    TAKE  1 TO 2 TABLETS BY MOUTH ONCE DAILY AS DIRECTED   FUROSEMIDE (LASIX) 20 MG TABLET    Take 20 mg by mouth daily as needed.   GLIPIZIDE (GLIPIZIDE XL) 10 MG 24 HR TABLET    Take 2 tablets (20 mg total) by mouth daily.   IPRATROPIUM-ALBUTEROL (DUONEB) 0.5-2.5 (3) MG/3ML SOLN    Take 3 mLs by nebulization 4 (four) times daily as needed.   ISOSORBIDE MONONITRATE (IMDUR) 30 MG 24 HR TABLET    Take 1 tablet (30 mg total) by mouth daily.   METOPROLOL SUCCINATE (TOPROL-XL) 50 MG 24 HR TABLET    Take 25 mg by mouth daily. Take with or immediately following a meal.   MULTIPLE VITAMINS-MINERALS (PRESERVISION AREDS PO)    Take by mouth daily.   ONE TOUCH ULTRA TEST TEST STRIP    CHECK BLOOD SUGAR TWICE DAILY AS DIRECTED   ONETOUCH DELICA LANCETS 33G MISC       TIOTROPIUM (SPIRIVA) 18 MCG INHALATION CAPSULE    Place 18 mcg into inhaler and inhale daily.  Modified Medications   No medications on file  Discontinued Medications   No medications on file

## 2014-09-23 NOTE — Progress Notes (Signed)
Pre visit review using our clinic review tool, if applicable. No additional management support is needed unless otherwise documented below in the visit note. 

## 2014-10-21 ENCOUNTER — Ambulatory Visit (INDEPENDENT_AMBULATORY_CARE_PROVIDER_SITE_OTHER): Payer: Medicare Other | Admitting: Family Medicine

## 2014-10-21 ENCOUNTER — Encounter: Payer: Self-pay | Admitting: Family Medicine

## 2014-10-21 ENCOUNTER — Telehealth: Payer: Self-pay | Admitting: Emergency Medicine

## 2014-10-21 VITALS — BP 122/60 | HR 90 | Temp 97.7°F | Ht 67.5 in | Wt 184.0 lb

## 2014-10-21 DIAGNOSIS — J449 Chronic obstructive pulmonary disease, unspecified: Secondary | ICD-10-CM | POA: Diagnosis not present

## 2014-10-21 DIAGNOSIS — J441 Chronic obstructive pulmonary disease with (acute) exacerbation: Secondary | ICD-10-CM

## 2014-10-21 DIAGNOSIS — Z87891 Personal history of nicotine dependence: Secondary | ICD-10-CM

## 2014-10-21 MED ORDER — PREDNISONE 20 MG PO TABS: ORAL_TABLET | ORAL | Status: DC

## 2014-10-21 MED ORDER — LEVOFLOXACIN 500 MG PO TABS: 500.0000 mg | ORAL_TABLET | Freq: Every day | ORAL | Status: DC

## 2014-10-21 NOTE — Telephone Encounter (Signed)
Please advise Dr. McQuaid thanks 

## 2014-10-21 NOTE — Progress Notes (Signed)
Dr. Karleen Hampshire T. Anthonio Mizzell, MD, CAQ Sports Medicine Primary [facility] and Sports Medicine [address] [place], [address] Phone: [phone] Fax: [phone]  10/21/2014  Patient: Timothy Cordova, MRN: [id number], DOB: April 28, 1928, 81 y.o.  Primary Physician:  Hannah Beat, MD  Chief Complaint: No chief complaint on file.  Subjective:   Timothy Cordova is a 81 y.o. very pleasant male patient who presents with the following:  COPD exacerbation again. Very well-known patient with a history of coronary disease, status post bypass grafting 10 years prior, and significant COPD and severe tobacco abuse distantly who presents generally with worsening pulmonary function over the last couple of years.  He is been having worsening COPD exacerbations, multiple infections with COPD exacerbations, and he had a prolonged bout with influenza and pneumonia and COPD exacerbation a year ago.  He is compliant with using his Spiriva and his Symbicort daily, and he also uses his albuterol as needed.  Nevertheless he has been on prednisone quite a bit more last 2 years compared to prior years.  He was seeing Dr. Delton Coombes, last OV 09/2012.   09/23/2014 Last OV with Hannah Beat, MD  Patient known very well with a heavy smoking history who has COPD who presents with several days of significant coughing, some shortness of breath, runny nose, sore throat, and generally not feeling well.  He is currently afebrile.  Pulse ox is 95%.  Few days, SOB. Runny nose, cough. ST.  COPD exac.  Past Medical History, Surgical History, Social History, Family History, Problem List, Medications, and Allergies have been reviewed and updated if relevant.  Patient Active Problem List   Diagnosis Date Noted  . COPD with acute exacerbation 05/21/2013  . Aortic calcification, 04/20/2005 CT Chest 04/03/2013  . Sleep apnea   . History of tobacco abuse   . Chronic low back pain 04/18/2011  . Renal lesion 03/27/2011  . CKD  (chronic kidney disease) stage 3, GFR 30-59 ml/min 08/18/2010  . OSTEOARTHRITIS 12/06/2009  . DIASTOLIC HEART FAILURE, CHRONIC 08/18/2009  . Mixed hyperlipidemia 06/11/2008  . CAD, AUTOLOGOUS BYPASS GRAFT 06/07/2008  . FATIGUE 12/17/2007  . DM (diabetes mellitus), type 2 with renal complications 09/04/2007  . DIZZINESS 09/04/2007  . ALLERGIC RHINITIS 01/30/2007  . DEMENTIA 07/06/2006  . HEARING LOSS 07/06/2006  . Essential hypertension 07/06/2006  . BENIGN PROSTATIC HYPERTROPHY 07/06/2006  . COPD 08/08/2005    Past Medical History  Diagnosis Date  . Coronary artery disease     s/p CABG 2006. LHC (1/10): SVG-OM and PLOM, SVG-D, and LIMA-LAD were all patent  . Hypertension   . COPD (chronic obstructive pulmonary disease)     moderate. followed by Dr.Byrum  . Chronic diastolic heart failure     8/08 ECHO with EF 55%  . Edema     localized. suspect diastolic CHF + venous insufficiency  . Mixed hyperlipidemia   . Sleep apnea     ?  . Diabetes mellitus   . History of tobacco abuse   . Allergic rhinitis   . Hypercholesterolemia   . Benign prostatic hypertrophy   . Osteoarthritis   . Dementia     (mild)--06/2006  . Hearing loss     left >>right  . CKD (chronic kidney disease)     Past Surgical History  Procedure Laterality Date  . Angioplasty  [id number]  . Rotator cuff repair  2005    left, Gioffre  . Coronary artery bypass graft  02/2005  . Cardiac catheterization  90s    [place], [place] x1stent  . Cardiac catheterization      [facility];x2 stents  . Cardiac catheterization  04/29/2013    History   Social History  . Marital Status: Married    Spouse Name: N/A  . Number of Children: N/A  . Years of Education: N/A   Occupational History  . rubber fabrication     RETIRED   Social History Main Topics  . Smoking status: Former Smoker -- 3.00 packs/day for 60 years    Types: Cigarettes    Quit date: 07/12/2005  . Smokeless tobacco: Never Used  . Alcohol Use: No  . Drug  Use: No  . Sexual Activity: Not on file   Other Topics Concern  . Not on file   Social History Narrative    Family History  Problem Relation Age of Onset  . Heart attack Mother   . Lung cancer Sister     Allergies  Allergen Reactions  . Metformin And Related   . Tramadol Hcl     Tremor vs seizure activity  . Ace Inhibitors     REACTION: cough  . Atorvastatin     REACTION: weakness  . Codeine Nausea And Vomiting  . Morphine Sulfate     REACTION: unspecified  . Ticlopidine Hcl     REACTION: unspecified  . Warfarin Sodium Other (See Comments)    Nausea and vomiting     Medication list reviewed and updated in full in [facility].  ROS: GEN: Acute illness details above GI: Tolerating PO intake GU: maintaining adequate hydration and urination Pulm: + SOB Interactive and getting along well at home.  Otherwise, ROS is as per the HPI.   Objective:   BP 122/60 mmHg  Pulse 90  Temp(Src) 97.7 F (36.5 C) (Oral)  Ht 5' 7.5" (1.715 m)  Wt 184 lb (83.462 kg)  BMI 28.38 kg/m2  SpO2 96%   GEN: A and O x 3. WDWN. NAD.    ENT: Nose clear, ext NML.  No LAD.  No JVD.  TM's clear. Oropharynx clear.  PULM: Normal WOB, no distress. No focal crackles, but the patient has diffuse rhonchi throughout all lung fields.  CV: RRR, no M/G/R, No rubs, No JVD.  Does not appear volume overloaded.  EXT: warm and well-perfused, No c/c/e. PSYCH: Pleasant and conversant.    Laboratory and Imaging Data:  Assessment and Plan:   COPD, moderate - Plan: Ambulatory referral to Pulmonology  COPD exacerbation  History of tobacco abuse  COPD exacerbation +/- concominant infection.  Overall, pulmonary health worsened in the last 2 years, and I think we need Pulmonology assistance with this very pleasant 81 year-old gentleman whose family is well-known to me and a pleasure to care for. They are requesting a consult with a new Pulmonologist, which I would appreciate.   New  Prescriptions   LEVOFLOXACIN (LEVAQUIN) 500 MG TABLET    Take 1 tablet (500 mg total) by mouth daily.   PREDNISONE (DELTASONE) 20 MG TABLET    2 tabs po for 1 week, then 1 tab po for 1 week   Orders Placed This Encounter  Procedures  . Ambulatory referral to Pulmonology    Signed,  Karleen HampshireSpencer T. Deniro Laymon, MD   Patient's Medications  New Prescriptions   LEVOFLOXACIN (LEVAQUIN) 500 MG TABLET    Take 1 tablet (500 mg total) by mouth daily.   PREDNISONE (DELTASONE) 20 MG TABLET    2 tabs po for 1 week, then  1 tab po for 1 week  Previous Medications   ACETAMINOPHEN (TYLENOL) 650 MG CR TABLET    Take 1,300 mg by mouth every 8 (eight) hours as needed for pain.   ALBUTEROL (PROVENTIL HFA;VENTOLIN HFA) 108 (90 BASE) MCG/ACT INHALER    Inhale 2 puffs into the lungs every 6 (six) hours as needed for wheezing.   ASPIRIN 81 MG EC TABLET    Take 81 mg by mouth daily.     ATORVASTATIN (LIPITOR) 80 MG TABLET    Take 40 mg by mouth daily.   BUDESONIDE-FORMOTEROL (SYMBICORT) 160-4.5 MCG/ACT INHALER    Inhale 2 puffs into the lungs 2 (two) times daily.   CHOLECALCIFEROL (VITAMIN D-3 PO)    Take 1,000 Units by mouth daily.    DOXAZOSIN (CARDURA) 4 MG TABLET    TAKE 1 TO 2 TABLETS BY MOUTH ONCE DAILY AS DIRECTED   FUROSEMIDE (LASIX) 20 MG TABLET    Take 20 mg by mouth daily as needed.   GLIPIZIDE (GLIPIZIDE XL) 10 MG 24 HR TABLET    Take 2 tablets (20 mg total) by mouth daily.   IPRATROPIUM-ALBUTEROL (DUONEB) 0.5-2.5 (3) MG/3ML SOLN    Take 3 mLs by nebulization 4 (four) times daily as needed.   ISOSORBIDE MONONITRATE (IMDUR) 30 MG 24 HR TABLET    Take 1 tablet (30 mg total) by mouth daily.   METOPROLOL SUCCINATE (TOPROL-XL) 50 MG 24 HR TABLET    Take 25 mg by mouth daily. Take with or immediately following a meal.   MULTIPLE VITAMINS-MINERALS (PRESERVISION AREDS PO)    Take by mouth daily.   ONE TOUCH ULTRA TEST TEST STRIP    CHECK BLOOD SUGAR TWICE DAILY AS DIRECTED   ONETOUCH DELICA LANCETS 33G MISC         TIOTROPIUM (SPIRIVA) 18 MCG INHALATION CAPSULE    Place 18 mcg into inhaler and inhale daily.  Modified Medications   No medications on file  Discontinued Medications   CLARITHROMYCIN (BIAXIN) 500 MG TABLET    Take 1 tablet (500 mg total) by mouth 2 (two) times daily.   PREDNISONE (DELTASONE) 20 MG TABLET    2 tabs po daily for 5 days, then 1 tab po for 5 days

## 2014-10-21 NOTE — Progress Notes (Signed)
Pre visit review using our clinic review tool, if applicable. No additional management support is needed unless otherwise documented below in the visit note. 

## 2014-10-21 NOTE — Telephone Encounter (Signed)
This is OK with me.

## 2014-10-21 NOTE — Patient Instructions (Signed)

## 2014-10-21 NOTE — Telephone Encounter (Signed)
Spoke with Timothy Cordova from Dr. Dallas Schimkeopeland office. Pt is wanting to switch from [facility] to Dr. Kendrick FriesMcQuaid for COPD. [facility] Schimke[facility] he did not have "confidence" in [facility]. Timothy Cordova is aware of our protocol for switching docs. Please advise [facility] thanks

## 2014-10-23 NOTE — Telephone Encounter (Signed)
This encounter was created in error - please disregard.

## 2014-10-27 NOTE — Telephone Encounter (Signed)
Called pt and appt scheduled to see BQ on 8/17 at 9:15 am. Nothing further needed

## 2014-10-27 NOTE — Telephone Encounter (Signed)
Fine by me 

## 2014-11-25 ENCOUNTER — Institutional Professional Consult (permissible substitution): Payer: Medicare Other | Admitting: Pulmonary Disease

## 2014-11-26 ENCOUNTER — Ambulatory Visit (INDEPENDENT_AMBULATORY_CARE_PROVIDER_SITE_OTHER)
Admission: RE | Admit: 2014-11-26 | Discharge: 2014-11-26 | Disposition: A | Discharge status: Home / Self Care | Payer: Medicare Other | Source: Ambulatory Visit | Attending: Pulmonary Disease | Admitting: Pulmonary Disease

## 2014-11-26 ENCOUNTER — Ambulatory Visit (INDEPENDENT_AMBULATORY_CARE_PROVIDER_SITE_OTHER): Payer: Medicare Other | Admitting: Pulmonary Disease

## 2014-11-26 ENCOUNTER — Other Ambulatory Visit (INDEPENDENT_AMBULATORY_CARE_PROVIDER_SITE_OTHER): Payer: Medicare Other

## 2014-11-26 ENCOUNTER — Telehealth: Payer: Self-pay | Admitting: Pulmonary Disease

## 2014-11-26 ENCOUNTER — Encounter: Payer: Self-pay | Admitting: Pulmonary Disease

## 2014-11-26 VITALS — BP 144/76 | HR 69 | Ht 67.5 in | Wt 189.0 lb

## 2014-11-26 DIAGNOSIS — D6489 Other specified anemias: Secondary | ICD-10-CM | POA: Diagnosis not present

## 2014-11-26 DIAGNOSIS — I5032 Chronic diastolic (congestive) heart failure: Secondary | ICD-10-CM

## 2014-11-26 DIAGNOSIS — J449 Chronic obstructive pulmonary disease, unspecified: Secondary | ICD-10-CM | POA: Diagnosis not present

## 2014-11-26 DIAGNOSIS — R0602 Shortness of breath: Secondary | ICD-10-CM

## 2014-11-26 DIAGNOSIS — D649 Anemia, unspecified: Secondary | ICD-10-CM | POA: Insufficient documentation

## 2014-11-26 DIAGNOSIS — R0600 Dyspnea, unspecified: Secondary | ICD-10-CM | POA: Diagnosis not present

## 2014-11-26 DIAGNOSIS — Z720 Tobacco use: Secondary | ICD-10-CM | POA: Diagnosis not present

## 2014-11-26 DIAGNOSIS — J432 Centrilobular emphysema: Secondary | ICD-10-CM

## 2014-11-26 LAB — CBC WITH DIFFERENTIAL/PLATELET
BASOS ABS: 0 10*3/uL (ref 0.0–0.1)
Basophils Relative: 0.4 % (ref 0.0–3.0)
EOS ABS: 0.1 10*3/uL (ref 0.0–0.7)
Eosinophils Relative: 1.6 % (ref 0.0–5.0)
HEMATOCRIT: 37.8 % — AB (ref 39.0–52.0)
HEMOGLOBIN: 12.5 g/dL — AB (ref 13.0–17.0)
LYMPHS PCT: 18.7 % (ref 12.0–46.0)
Lymphs Abs: 1.5 10*3/uL (ref 0.7–4.0)
MCHC: 33.1 g/dL (ref 30.0–36.0)
MCV: 92.1 fl (ref 78.0–100.0)
MONO ABS: 0.8 10*3/uL (ref 0.1–1.0)
Monocytes Relative: 10 % (ref 3.0–12.0)
Neutro Abs: 5.7 10*3/uL (ref 1.4–7.7)
Neutrophils Relative %: 69.3 % (ref 43.0–77.0)
PLATELETS: 197 10*3/uL (ref 150.0–400.0)
RBC: 4.11 Mil/uL — ABNORMAL LOW (ref 4.22–5.81)
RDW: 14.4 % (ref 11.5–15.5)
WBC: 8.2 10*3/uL (ref 4.0–10.5)

## 2014-11-26 LAB — BRAIN NATRIURETIC PEPTIDE: PRO B NATRI PEPTIDE: 152 pg/mL — AB (ref 0.0–100.0)

## 2014-11-26 NOTE — Patient Instructions (Signed)
Stopped taking Spiriva and Symbicort, take Anoro 1 puffs in the morning, call me to let me know how it is working We will call you with the results of the chest x-ray and the CT scan of the bloodwork We will see you back in 3-4 weeks or sooner if needed

## 2014-11-26 NOTE — Assessment & Plan Note (Signed)
He has a normocytic anemia noted on CBC in the last year without clear underlying cause.  Plan: Repeat CBC today as this can contribute to dyspnea, depending on the results he may need further workup.

## 2014-11-26 NOTE — Assessment & Plan Note (Signed)
He has had severe COPD at least since 2009 because his lung function testing at that point showed an FEV1 of 40% predicted. Interestingly, he has not had much change in his FEV1 in the last 7 years. Further, he does not complain of much dyspnea, though he did have a COPD exacerbation recently.  So it's difficult for me to say that progressive COPD is the only cause of his shortness of breath. He is interested in trying new medications. I explained to him that there are no new classes of medications for COPD at this time. However, recent studies have shown that combination therapy with long acting bronchodilators have been shown to be superior to therapy with inhaled cortical steroid for patients with COPD. For ease of use and improved cost he may be better with a drug like Anoro.  Plan: Trial of Anoro instead of Spiriva and Symbicort Exercises much as possible

## 2014-11-26 NOTE — Assessment & Plan Note (Signed)
Timothy Cordova is here to see me today primarily because of progressive and worsening dyspnea. On physical exam he has crackles in the bases of his lungs, my review of his chest imaging from a year ago shows primarily chronic bronchitis and emphysema changes. His ambulatory oximetry today was completely normal with an O2 saturation of 97%. He has some mild generalized weakness. He also has cardiac disease with a history of diastolic heart failure but today he appears euvolemic on exam. He is mildly anemic without an unclear cause.  I explained to him that the differential diagnosis of shortness of breath is broad and he has many reasons to explain this. Interestingly, his COPD has not markedly progressed since the last lung function test which was performed in 2009. Today his FEV1 is 1.06 L which is essentially identical to the last test in 2009. That said, I'm sure his COPD contributes to some degree, but I do worry that physical deconditioning is the most likely cause of his dyspnea. Also, the crackles in the bases of his lungs do raise concern for an interstitial lung disease since he appears euvolemic on exam today.  Plan: Exercise as much as possible See COPD above High-resolution CT chest to evaluate for interstitial lung disease Repeat CBC today to follow-up on anemia Check pro BNP

## 2014-11-26 NOTE — Telephone Encounter (Signed)
I called Mr. Tumminello to let them know that he still has some degree of anemia on his blood work. It is mild and normocytic. I explained that the first step would be to check for GI loss. However, I had to leave a message. I asked him to call us back.

## 2014-11-26 NOTE — Assessment & Plan Note (Signed)
He quit smoking in 2006 after smoking 3 ppd for 61 years.  He is at increased risk for lung cancer.  Based on this he qualifies for lung cancer screening.    Plan: Order Low Dose CT scan of the lungs
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
## 2014-11-26 NOTE — Assessment & Plan Note (Signed)
He appears euvolemic on exam today but continues to complain of shortness of breath as detailed above.  Plan: Check pro BNP Continue current therapy

## 2014-11-27 ENCOUNTER — Telehealth: Payer: Self-pay | Admitting: Pulmonary Disease

## 2014-11-27 NOTE — Telephone Encounter (Signed)
Patient calling Dr. Kendrick Fries back. Per Dr. Ulyses Jarred notes, he attempted to contact patient to discuss blood loss with patient. Patient says that Dr. Kendrick Fries can call him any time today at [phone]  To BQ

## 2014-11-27 NOTE — Telephone Encounter (Signed)
BQ before I try pt back, what do I need to do on my end for the patient?  Thanks!

## 2014-11-28 NOTE — Telephone Encounter (Signed)
I talked to him this afternoon he told me that he had not had a recent test for GI bleeding. I will try to contact his primary care physician's office to see if we can screen him for occult blood loss.

## 2014-11-28 NOTE — Telephone Encounter (Signed)
Hi Spencer, this is a nice gentleman that you sent me for shortness of breath. I'm in the process of working it up and I think that it's primarily a deconditioning and pulmonary problem. However, I noted some mild anemia. He says that he hasn't had any stool cards or GI workup recently. Does your office offer fecal occult blood testing? If so could they send a kit out to him?

## 2014-11-29 NOTE — Telephone Encounter (Signed)
Thanks

## 2014-11-29 NOTE — Telephone Encounter (Signed)
Ruby Cola, I agree that would be a good idea.   Terri, can you help them get an IFOB kit? I will leave it to you to decide if mail vs. Picking up would be better. Webb Silversmith or [name] would be ok with either)

## 2014-11-30 ENCOUNTER — Ambulatory Visit (INDEPENDENT_AMBULATORY_CARE_PROVIDER_SITE_OTHER)
Admission: RE | Admit: 2014-11-30 | Discharge: 2014-11-30 | Disposition: A | Discharge status: Home / Self Care | Payer: Medicare Other | Source: Ambulatory Visit | Attending: Pulmonary Disease | Admitting: Pulmonary Disease

## 2014-11-30 DIAGNOSIS — R0602 Shortness of breath: Secondary | ICD-10-CM

## 2014-11-30 DIAGNOSIS — J432 Centrilobular emphysema: Secondary | ICD-10-CM

## 2014-11-30 DIAGNOSIS — J449 Chronic obstructive pulmonary disease, unspecified: Secondary | ICD-10-CM | POA: Diagnosis not present

## 2014-12-01 ENCOUNTER — Other Ambulatory Visit: Payer: Self-pay

## 2014-12-01 MED ORDER — UMECLIDINIUM-VILANTEROL 62.5-25 MCG/INH IN AEPB: 1.0000 | INHALATION_SPRAY | Freq: Every day | RESPIRATORY_TRACT | Status: DC

## 2014-12-07 NOTE — Telephone Encounter (Signed)
ifob kit mailed today

## 2014-12-22 ENCOUNTER — Ambulatory Visit: Payer: Medicare Other | Admitting: Pulmonary Disease

## 2015-01-07 ENCOUNTER — Other Ambulatory Visit: Payer: Self-pay | Admitting: Family Medicine

## 2015-01-25 ENCOUNTER — Telehealth: Payer: Self-pay | Admitting: Cardiovascular Disease

## 2015-01-25 NOTE — Telephone Encounter (Signed)
Attempted to schedule from recall list 12 m fu .  Patient is a [employer] and the [facility] at the [facility] time.     Deleting Recall.

## 2015-03-19 ENCOUNTER — Telehealth: Payer: Self-pay | Admitting: *Deleted

## 2015-03-19 NOTE — Telephone Encounter (Signed)
Left message for [name] to call the office to schedule his flu shot or let us know if he had one done elsewhere.

## 2015-04-26 ENCOUNTER — Ambulatory Visit (INDEPENDENT_AMBULATORY_CARE_PROVIDER_SITE_OTHER)
Admission: RE | Admit: 2015-04-26 | Discharge: 2015-04-26 | Disposition: A | Discharge status: Home / Self Care | Payer: Medicare Other | Source: Ambulatory Visit | Attending: Family Medicine | Admitting: Family Medicine

## 2015-04-26 ENCOUNTER — Ambulatory Visit (INDEPENDENT_AMBULATORY_CARE_PROVIDER_SITE_OTHER): Payer: Medicare Other | Admitting: Family Medicine

## 2015-04-26 ENCOUNTER — Encounter: Payer: Self-pay | Admitting: Family Medicine

## 2015-04-26 VITALS — BP 140/76 | HR 71 | Temp 98.4°F | Ht 67.5 in | Wt 194.8 lb

## 2015-04-26 DIAGNOSIS — M79641 Pain in right hand: Secondary | ICD-10-CM | POA: Diagnosis not present

## 2015-04-26 DIAGNOSIS — Z23 Encounter for immunization: Secondary | ICD-10-CM

## 2015-04-26 NOTE — Progress Notes (Signed)
Dr. Karleen Hampshire T. Delcia Spitzley, MD, CAQ Sports [facility] Sports Medicine [address] [place], [address] Phone: [phone] Fax: [phone]  04/26/2015  Patient: Timothy Cordova, MRN: [id number], DOB: 1928/09/18, 82 y.o.  Primary Physician:  Hannah Beat, MD   Chief Complaint  Patient presents with  . Hand Injury    fell on Saturday--right hand--painful--swelling   Subjective:   Timothy Cordova is a 82 y.o. very pleasant male patient who presents with the following:  Immunization History  Administered Date(s) Administered  . Influenza Split 03/21/2011, 01/09/2012  . Influenza Whole 01/30/2005  . Pneumococcal Conjugate-13 08/19/2014  . Pneumococcal Polysaccharide-23 04/11/2003  . Td 04/11/2003  . Zoster 12/17/2007, 12/18/2007    Hand, R 5th:  He fell on Saturday and has a small laceration also has some swelling and pain in his hand and in his fourth and fifth fingers.  Past Medical History, Surgical History, Social History, Family History, Problem List, Medications, and Allergies have been reviewed and updated if relevant.  Patient Active Problem List   Diagnosis Date Noted  . CKD (chronic kidney disease) stage 3, GFR 30-59 ml/min 08/18/2010    Priority: High  . DIASTOLIC HEART FAILURE, CHRONIC 08/18/2009    Priority: High  . CAD, AUTOLOGOUS BYPASS GRAFT 06/07/2008    Priority: High  . DM (diabetes mellitus), type 2 with renal complications (HCC) 09/04/2007    Priority: High  . Centrilobular emphysema (HCC) 08/08/2005    Priority: High  . Anemia 11/26/2014  . Dyspnea 11/26/2014  . Aortic calcification, 04/20/2005 CT Chest 04/03/2013  . Sleep apnea   . Tobacco abuse (in remission)   . Chronic low back pain 04/18/2011  . Renal lesion 03/27/2011  . OSTEOARTHRITIS 12/06/2009  . Mixed hyperlipidemia 06/11/2008  . ALLERGIC RHINITIS 01/30/2007  . DEMENTIA 07/06/2006  . HEARING LOSS 07/06/2006  . Essential hypertension 07/06/2006  . BENIGN  PROSTATIC HYPERTROPHY 07/06/2006    Past Medical History  Diagnosis Date  . Coronary artery disease     s/p CABG 2006. LHC (1/10): SVG-OM and PLOM, SVG-D, and LIMA-LAD were all patent  . Hypertension   . COPD (chronic obstructive pulmonary disease) (HCC)     moderate. followed by Dr.Byrum  . Chronic diastolic heart failure (HCC)     8/08 ECHO with EF 55%  . Edema     localized. suspect diastolic CHF + venous insufficiency  . Mixed hyperlipidemia   . Sleep apnea     ?  . Diabetes mellitus   . History of tobacco abuse   . Allergic rhinitis   . Hypercholesterolemia   . Benign prostatic hypertrophy   . Osteoarthritis   . Dementia     (mild)--06/2006  . Hearing loss     left >>right  . CKD (chronic kidney disease)     Past Surgical History  Procedure Laterality Date  . Angioplasty  [id number]  . Rotator cuff repair  2005    left, Gioffre  . Coronary artery bypass graft  02/2005  . Cardiac catheterization  90s    [place] x1stent  . Cardiac catheterization      [facility];x2 stents  . Cardiac catheterization  04/29/2013    Social History   Social History  . Marital Status: Married    Spouse Name: N/A  . Number of Children: N/A  . Years of Education: N/A   Occupational History  . rubber fabrication     RETIRED   Social History Main Topics  .  Smoking status: Former Smoker -- 3.00 packs/day for 60 years    Types: Cigarettes    Quit date: 07/12/2005  . Smokeless tobacco: Never Used  . Alcohol Use: No  . Drug Use: No  . Sexual Activity: Not on file   Other Topics Concern  . Not on file   Social History Narrative    Family History  Problem Relation Age of Onset  . Heart attack Mother   . Lung cancer Sister     Allergies  Allergen Reactions  . Metformin And Related   . Tramadol Hcl     Tremor vs seizure activity  . Ace Inhibitors     REACTION: cough  . Atorvastatin     REACTION: weakness  . Codeine Nausea And Vomiting  . Morphine Sulfate      REACTION: unspecified  . Ticlopidine Hcl     REACTION: unspecified  . Warfarin Sodium Other (See Comments)    Nausea and vomiting     Medication list reviewed and updated in full in [facility].  GEN: No fevers, chills. Nontoxic. Primarily MSK c/o today. MSK: Detailed in the HPI GI: tolerating PO intake without difficulty Neuro: No numbness, parasthesias, or tingling associated. Otherwise the pertinent positives of the ROS are noted above.   Objective:   BP 140/76 mmHg  Pulse 71  Temp(Src) 98.4 F (36.9 C) (Oral)  Ht 5' 7.5" (1.715 m)  Wt 194 lb 12.8 oz (88.361 kg)  BMI 30.04 kg/m2  SpO2 98%   GEN: WDWN, NAD, Non-toxic, Alert & Oriented x 3 HEENT: Atraumatic, Normocephalic.  Ears and Nose: No external deformity. EXTR: No clubbing/cyanosis/edema NEURO: Normal gait.  PSYCH: Normally interactive. Conversant. Not depressed or anxious appearing.  Calm demeanor.     mild swelling on the dorsum of the hand as well as some bruising on the fifth digit. There is also a laceration between the fifth and fourth MCPs. No redness or warmth.   grossly all bones in the hand and carpal region are nontender.  Radiology: Dg Hand Complete Right  04/26/2015  CLINICAL DATA:  Fall.  Pain. EXAM: RIGHT HAND - COMPLETE 3+ VIEW COMPARISON:  No recent prior. FINDINGS: No acute bony or joint abnormality identified. No evidence of fracture or dislocation. Diffuse mild degenerative change. IMPRESSION: No acute abnormality. Electronically Signed   By: Maisie Fus  Register   On: 04/26/2015 16:54     Assessment and Plan:   Right hand pain - Plan: DG Hand Complete Right  Need for TD vaccine - Plan: Td vaccine greater than or equal to 7yo preservative free IM   no acute fracture. Soft tissue contusion with small laceration. Basic wound care.  Follow-up: No Follow-up on file.  New Prescriptions   No medications on file   Modified Medications   No medications on file   Orders Placed This  Encounter  Procedures  . DG Hand Complete Right  . Td vaccine greater than or equal to 7yo preservative free IM    Signed,  Matyas Baisley T. Rydge Texidor, MD   Patient's Medications  New Prescriptions   No medications on file  Previous Medications   ACETAMINOPHEN (TYLENOL) 650 MG CR TABLET    Take 1,300 mg by mouth every 8 (eight) hours as needed for pain.   ALBUTEROL (PROVENTIL HFA;VENTOLIN HFA) 108 (90 BASE) MCG/ACT INHALER    Inhale 2 puffs into the lungs every 6 (six) hours as needed for wheezing.   ASPIRIN 81 MG EC TABLET    Take  81 mg by mouth daily.     ATORVASTATIN (LIPITOR) 80 MG TABLET    Take 40 mg by mouth daily.   CHOLECALCIFEROL (VITAMIN D-3 PO)    Take 1,000 Units by mouth daily.    DOXAZOSIN (CARDURA) 4 MG TABLET    TAKE 1 TO 2 TABLETS BY MOUTH EVERY DAY AS DIRECTED.   FUROSEMIDE (LASIX) 20 MG TABLET    Take 20 mg by mouth daily as needed.   GLIPIZIDE (GLIPIZIDE XL) 10 MG 24 HR TABLET    Take 2 tablets (20 mg total) by mouth daily.   IPRATROPIUM-ALBUTEROL (DUONEB) 0.5-2.5 (3) MG/3ML SOLN    Take 3 mLs by nebulization 4 (four) times daily as needed.   ISOSORBIDE MONONITRATE (IMDUR) 30 MG 24 HR TABLET    Take 1 tablet (30 mg total) by mouth daily.   METOPROLOL SUCCINATE (TOPROL-XL) 50 MG 24 HR TABLET    Take 25 mg by mouth daily. Take with or immediately following a meal.   MULTIPLE VITAMINS-MINERALS (PRESERVISION AREDS PO)    Take by mouth daily.   ONE TOUCH ULTRA TEST TEST STRIP    CHECK BLOOD SUGAR TWICE DAILY AS DIRECTED   ONETOUCH DELICA LANCETS 33G MISC       PREDNISONE (DELTASONE) 20 MG TABLET    2 tabs po for 1 week, then 1 tab po for 1 week   UMECLIDINIUM-VILANTEROL (ANORO ELLIPTA) 62.5-25 MCG/INH AEPB    Inhale 1 puff into the lungs daily.  Modified Medications   No medications on file  Discontinued Medications   BUDESONIDE-FORMOTEROL (SYMBICORT) 160-4.5 MCG/ACT INHALER    Inhale 2 puffs into the lungs 2 (two) times daily.   TIOTROPIUM (SPIRIVA) 18 MCG INHALATION  CAPSULE    Place 18 mcg into inhaler and inhale daily.

## 2015-04-26 NOTE — Progress Notes (Signed)
Pre visit review using our clinic review tool, if applicable. No additional management support is needed unless otherwise documented below in the visit note. 

## 2015-04-28 ENCOUNTER — Ambulatory Visit: Payer: Medicare Other | Admitting: Family Medicine

## 2015-08-31 ENCOUNTER — Encounter: Payer: Self-pay | Admitting: Family Medicine

## 2015-08-31 ENCOUNTER — Ambulatory Visit (INDEPENDENT_AMBULATORY_CARE_PROVIDER_SITE_OTHER): Payer: Medicare Other | Admitting: Family Medicine

## 2015-08-31 VITALS — BP 112/60 | HR 78 | Temp 98.3°F | Wt 192.2 lb

## 2015-08-31 DIAGNOSIS — E1122 Type 2 diabetes mellitus with diabetic chronic kidney disease: Secondary | ICD-10-CM

## 2015-08-31 DIAGNOSIS — J432 Centrilobular emphysema: Secondary | ICD-10-CM | POA: Diagnosis not present

## 2015-08-31 MED ORDER — ALBUTEROL SULFATE HFA 108 (90 BASE) MCG/ACT IN AERS: 2.0000 | INHALATION_SPRAY | Freq: Four times a day (QID) | RESPIRATORY_TRACT | Status: DC | PRN

## 2015-08-31 MED ORDER — GLIPIZIDE ER 10 MG PO TB24: 10.0000 mg | ORAL_TABLET | Freq: Two times a day (BID) | ORAL | Status: DC

## 2015-08-31 MED ORDER — LEVOFLOXACIN 500 MG PO TABS: 500.0000 mg | ORAL_TABLET | Freq: Every day | ORAL | Status: DC

## 2015-08-31 MED ORDER — PREDNISONE 20 MG PO TABS: ORAL_TABLET | ORAL | Status: DC

## 2015-08-31 NOTE — Progress Notes (Signed)
Pre visit review using our clinic review tool, if applicable. No additional management support is needed unless otherwise documented below in the visit note.  Sx started about 1 week ago.  ST initially, better now.  Some rhinorrhea.  More chest congestion in the meantime.  Fever yesterday, felt feverish but didn't check temp.   Out of albuterol in the meantime, ran out months ago.  More sputum then normal.  Discolored.  More SOB than usual- when he has trouble clearing his sputum.  More cough than normal.   More wheeze.  Pulmonary disease known at baseline.    Sugar has been ~130-160 usually if fasting.  He had hyperglycemia with pred use prev not tolerated otherwise.    Meds, vitals, and allergies reviewed.   ROS: Per HPI unless specifically indicated in ROS section   nad ncat Tm w/o erythema Nasal exam with mild irritation.  Mm slightly dry- this is baseline for patient after he takes his A meds and he uses biotene spray throughout the day Neck supple, no LA rrr No inc wob and speaking in complete sentences but diffuse rhonchi in the lungs with exp wheeze w/o focal dec in BS abd soft Ext with trace BLE edema.

## 2015-08-31 NOTE — Assessment & Plan Note (Signed)
[facility] re[facility]  He agrees.

## 2015-08-31 NOTE — Assessment & Plan Note (Signed)
Med list updated.  Restart SABA prn.  pred with steroid cautions, start levaquin, has tolerated in the past.   Still okay for outpatient f/u.  Routed to PCP as FYI.   Patient to update us as needed.

## 2015-08-31 NOTE — Patient Instructions (Signed)
Restart albuterol.  Continue your other inhaler.  Start prednisone with food and start the antibiotics today.  Update us as needed.  Schedule follow up about your diabetes, either here or at [facility].  Take care.  Glad to see you.

## 2015-09-01 ENCOUNTER — Ambulatory Visit (INDEPENDENT_AMBULATORY_CARE_PROVIDER_SITE_OTHER): Payer: Medicare Other | Admitting: Family Medicine

## 2015-09-01 ENCOUNTER — Encounter: Payer: Self-pay | Admitting: Family Medicine

## 2015-09-01 VITALS — BP 110/60 | HR 64 | Temp 97.5°F | Ht 67.5 in | Wt 191.8 lb

## 2015-09-01 DIAGNOSIS — N183 Chronic kidney disease, stage 3 unspecified: Secondary | ICD-10-CM

## 2015-09-01 DIAGNOSIS — E782 Mixed hyperlipidemia: Secondary | ICD-10-CM | POA: Diagnosis not present

## 2015-09-01 DIAGNOSIS — J432 Centrilobular emphysema: Secondary | ICD-10-CM

## 2015-09-01 DIAGNOSIS — E1122 Type 2 diabetes mellitus with diabetic chronic kidney disease: Secondary | ICD-10-CM

## 2015-09-01 DIAGNOSIS — Z79899 Other long term (current) drug therapy: Secondary | ICD-10-CM

## 2015-09-01 LAB — CBC WITH DIFFERENTIAL/PLATELET
BASOS PCT: 0.2 % (ref 0.0–3.0)
Basophils Absolute: 0 10*3/uL (ref 0.0–0.1)
EOS PCT: 0.3 % (ref 0.0–5.0)
Eosinophils Absolute: 0 10*3/uL (ref 0.0–0.7)
HEMATOCRIT: 37 % — AB (ref 39.0–52.0)
HEMOGLOBIN: 12.1 g/dL — AB (ref 13.0–17.0)
Lymphocytes Relative: 12.7 % (ref 12.0–46.0)
Lymphs Abs: 1.2 10*3/uL (ref 0.7–4.0)
MCHC: 32.7 g/dL (ref 30.0–36.0)
MCV: 93.3 fl (ref 78.0–100.0)
MONO ABS: 0.7 10*3/uL (ref 0.1–1.0)
MONOS PCT: 7 % (ref 3.0–12.0)
Neutro Abs: 7.7 10*3/uL (ref 1.4–7.7)
Neutrophils Relative %: 79.8 % — ABNORMAL HIGH (ref 43.0–77.0)
Platelets: 195 10*3/uL (ref 150.0–400.0)
RBC: 3.97 Mil/uL — AB (ref 4.22–5.81)
RDW: 13.9 % (ref 11.5–15.5)
WBC: 9.6 10*3/uL (ref 4.0–10.5)

## 2015-09-01 LAB — BASIC METABOLIC PANEL
BUN: 30 mg/dL — AB (ref 6–23)
CO2: 29 meq/L (ref 19–32)
Calcium: 9.5 mg/dL (ref 8.4–10.5)
Chloride: 106 mEq/L (ref 96–112)
Creatinine, Ser: 1.36 mg/dL (ref 0.40–1.50)
GFR: 52.89 mL/min — AB (ref >60.00)
GLUCOSE: 116 mg/dL — AB (ref 70–99)
Potassium: 5.1 mEq/L (ref 3.5–5.1)
Sodium: 138 mEq/L (ref 135–145)

## 2015-09-01 LAB — HEPATIC FUNCTION PANEL
ALBUMIN: 3.9 g/dL (ref 3.5–5.2)
ALT: 12 U/L (ref 0–53)
AST: 13 U/L (ref 0–37)
Alkaline Phosphatase: 56 U/L (ref 39–117)
Bilirubin, Direct: 0 mg/dL (ref 0.0–0.3)
TOTAL PROTEIN: 6.5 g/dL (ref 6.0–8.3)
Total Bilirubin: 0.4 mg/dL (ref 0.2–1.2)

## 2015-09-01 LAB — LIPID PANEL
CHOL/HDL RATIO: 3
CHOLESTEROL: 113 mg/dL (ref 0–200)
HDL: 34.9 mg/dL — ABNORMAL LOW (ref >39.00)
LDL CALC: 64 mg/dL (ref 0–99)
NONHDL: 77.69
Triglycerides: 67 mg/dL (ref 0.0–149.0)
VLDL: 13.4 mg/dL (ref 0.0–40.0)

## 2015-09-01 LAB — MICROALBUMIN / CREATININE URINE RATIO
CREATININE, U: 160.4 mg/dL
MICROALB UR: 57.5 mg/dL — AB (ref 0.0–1.9)
Microalb Creat Ratio: 35.9 mg/g — ABNORMAL HIGH (ref 0.0–30.0)

## 2015-09-01 LAB — HEMOGLOBIN A1C: Hgb A1c MFr Bld: 7.5 % — ABNORMAL HIGH (ref 4.6–6.5)

## 2015-09-01 NOTE — Progress Notes (Signed)
Dr. Karleen Hampshire T. Lianni Kanaan, MD, CAQ Sports Medicine [facility] Sports Medicine [address] [place], [address] Phone: [phone] Fax: [phone]  09/01/2015  Patient: Timothy Cordova, MRN: [id number], DOB: 11/18/1928, 82 y.o.  Primary Physician:  Hannah Beat, MD   Chief Complaint  Patient presents with  . Diabetes   Subjective:   Timothy Cordova is a 82 y.o. very pleasant male patient who presents with the following:  F/u multiple med issues  Diabetes Mellitus: Tolerating Medications: yes Compliance with diet: fair Exercise: minimal / intermittent Avg blood sugars at home: not checking Foot problems: none Hypoglycemia: none No nausea, vomitting, blurred vision, polyuria.  Lab Results  Component Value Date   HGBA1C 7.9* 06/22/2014   HGBA1C 8.0* 04/27/2013   HGBA1C 6.6* 09/20/2011   Lab Results  Component Value Date   MICROALBUR 2.0* 09/20/2011   LDLCALC 60 08/19/2014   CREATININE 1.46 08/19/2014    Wt Readings from Last 3 Encounters:  09/01/15 191 lb 12 oz (86.977 kg)  08/31/15 192 lb 4 oz (87.204 kg)  04/26/15 194 lb 12.8 oz (88.361 kg)    Body mass index is 29.57 kg/(m^2).   Lipids: Doing well, stable. Tolerating meds fine with no SE. Panel reviewed with patient.  Lipids:    Component Value Date/Time   CHOL 130 08/19/2014 1516   CHOL 155 04/27/2013 0601   CHOL 190 04/23/2013 1227   TRIG 189.0* 08/19/2014 1516   TRIG 120 04/27/2013 0601   HDL 31.90* 08/19/2014 1516   HDL 69* 04/27/2013 0601   HDL 63 04/23/2013 1227   VLDL 37.8 08/19/2014 1516   VLDL 24 04/27/2013 0601   CHOLHDL 4 08/19/2014 1516    Lab Results  Component Value Date   ALT 12 08/19/2014   AST 12 08/19/2014   ALKPHOS 102 08/19/2014   BILITOT 0.4 08/19/2014     Recent COPD exac and on Pred and LVQ, still wheezing  Past Medical History, Surgical History, Social History, Family History, Problem List, Medications, and Allergies have been reviewed and  updated if relevant.  Patient Active Problem List   Diagnosis Date Noted  . CKD (chronic kidney disease) stage 3, GFR 30-59 ml/min 08/18/2010    Priority: High  . DIASTOLIC HEART FAILURE, CHRONIC 08/18/2009    Priority: High  . CAD, AUTOLOGOUS BYPASS GRAFT 06/07/2008    Priority: High  . DM (diabetes mellitus), type 2 with renal complications (HCC) 09/04/2007    Priority: High  . Centrilobular emphysema (HCC) 08/08/2005    Priority: High  . Anemia 11/26/2014  . Dyspnea 11/26/2014  . Aortic calcification, 04/20/2005 CT Chest 04/03/2013  . Sleep apnea   . Tobacco abuse (in remission)   . Chronic low back pain 04/18/2011  . Renal lesion 03/27/2011  . OSTEOARTHRITIS 12/06/2009  . Mixed hyperlipidemia 06/11/2008  . ALLERGIC RHINITIS 01/30/2007  . DEMENTIA 07/06/2006  . HEARING LOSS 07/06/2006  . Essential hypertension 07/06/2006  . BENIGN PROSTATIC HYPERTROPHY 07/06/2006    Past Medical History  Diagnosis Date  . Coronary artery disease     s/p CABG 2006. LHC (1/10): SVG-OM and PLOM, SVG-D, and LIMA-LAD were all patent  . Hypertension   . COPD (chronic obstructive pulmonary disease) (HCC)     moderate. followed by Dr.Byrum  . Chronic diastolic heart failure (HCC)     8/08 ECHO with EF 55%  . Edema     localized. suspect diastolic CHF + venous insufficiency  . Mixed hyperlipidemia   .  Sleep apnea     ?  . Diabetes mellitus   . History of tobacco abuse   . Allergic rhinitis   . Hypercholesterolemia   . Benign prostatic hypertrophy   . Osteoarthritis   . Dementia     (mild)--06/2006  . Hearing loss     left >>right  . CKD (chronic kidney disease)     Past Surgical History  Procedure Laterality Date  . Angioplasty  [id number]  . Rotator cuff repair  2005    left, Gioffre  . Coronary artery bypass graft  02/2005  . Cardiac catheterization  90s    [place], [place] x1stent  . Cardiac catheterization      [facility];x2 stents  . Cardiac catheterization  04/29/2013    Social  History   Social History  . Marital Status: Married    Spouse Name: N/A  . Number of Children: N/A  . Years of Education: N/A   Occupational History  . rubber fabrication     RETIRED   Social History Main Topics  . Smoking status: Former Smoker -- 3.00 packs/day for 60 years    Types: Cigarettes    Quit date: 07/12/2005  . Smokeless tobacco: Never Used  . Alcohol Use: No  . Drug Use: No  . Sexual Activity: Not on file   Other Topics Concern  . Not on file   Social History Narrative    Family History  Problem Relation Age of Onset  . Heart attack Mother   . Lung cancer Sister     Allergies  Allergen Reactions  . Metformin And Related   . Tramadol Hcl     Tremor vs seizure activity  . Ace Inhibitors     REACTION: cough  . Atorvastatin     REACTION: weakness  . Codeine Nausea And Vomiting  . Morphine Sulfate     REACTION: unspecified  . Ticlopidine Hcl     REACTION: unspecified  . Warfarin Sodium Other (See Comments)    Nausea and vomiting     Medication list reviewed and updated in full in [facility].   GEN: No acute illnesses, no fevers, chills. GI: No n/v/d, eating normally Pulm: No SOB Interactive and getting along well at home.  Otherwise, ROS is as per the HPI.  Objective:   BP 110/60 mmHg  Pulse 64  Temp(Src) 97.5 F (36.4 C) (Oral)  Ht 5' 7.5" (1.715 m)  Wt 191 lb 12 oz (86.977 kg)  BMI 29.57 kg/m2  GEN: WDWN, NAD, Non-toxic, A & O x 3 HEENT: Atraumatic, Normocephalic. Neck supple. No masses, No LAD. Ears and Nose: No external deformity. CV: RRR, No M/G/R. No JVD. No thrill. No extra heart sounds. PULM: diffuse wheezes, crackles, rhonchi. No retractions. No resp. distress. No accessory muscle use. EXTR: No c/c/e NEURO Normal gait.  PSYCH: Normally interactive. Conversant. Not depressed or anxious appearing.  Calm demeanor.   Laboratory and Imaging Data:  Assessment and Plan:   Type 2 diabetes mellitus with stage 3 chronic  kidney disease, without long-term current use of insulin (HCC) - Plan: Basic metabolic panel, Microalbumin / creatinine urine ratio, Hemoglobin A1c  Mixed hyperlipidemia - Plan: Lipid panel  CKD (chronic kidney disease) stage 3, GFR 30-59 ml/min  Encounter for long-term (current) use of medications - Plan: CBC with Differential/Platelet, Hepatic function panel  Centrilobular emphysema (HCC)  Recovering from COPD exac now  All other chronic illnesses have been stable - check labs  Follow-up: 6 mo  Orders  Placed This Encounter  Procedures  . Basic metabolic panel  . CBC with Differential/Platelet  . Hepatic function panel  . Microalbumin / creatinine urine ratio  . Hemoglobin A1c  . Lipid panel    Signed,  Karleen HampshireSpencer T. Thurman Sarver, MD   Patient's Medications  New Prescriptions   No medications on file  Previous Medications   ACETAMINOPHEN (TYLENOL) 650 MG CR TABLET    Take 1,300 mg by mouth every 8 (eight) hours as needed for pain.   ALBUTEROL (PROVENTIL HFA;VENTOLIN HFA) 108 (90 BASE) MCG/ACT INHALER    Inhale 2 puffs into the lungs every 6 (six) hours as needed for wheezing.   ASPIRIN 81 MG EC TABLET    Take 81 mg by mouth daily.     ATORVASTATIN (LIPITOR) 80 MG TABLET    Take 40 mg by mouth daily.   CHOLECALCIFEROL (VITAMIN D-3 PO)    Take 1,000 Units by mouth daily.    DOXAZOSIN (CARDURA) 4 MG TABLET    TAKE 1 TO 2 TABLETS BY MOUTH EVERY DAY AS DIRECTED.   FUROSEMIDE (LASIX) 20 MG TABLET    Take 20 mg by mouth daily as needed.   GLIPIZIDE (GLIPIZIDE XL) 10 MG 24 HR TABLET    Take 1 tablet (10 mg total) by mouth 2 (two) times daily.   ISOSORBIDE MONONITRATE (IMDUR) 30 MG 24 HR TABLET    Take 1 tablet (30 mg total) by mouth daily.   LEVOFLOXACIN (LEVAQUIN) 500 MG TABLET    Take 1 tablet (500 mg total) by mouth daily.   METOPROLOL SUCCINATE (TOPROL-XL) 50 MG 24 HR TABLET    Take 25 mg by mouth daily. Take with or immediately following a meal.   MULTIPLE VITAMINS-MINERALS  (PRESERVISION AREDS PO)    Take by mouth daily.   ONE TOUCH ULTRA TEST TEST STRIP    CHECK BLOOD SUGAR TWICE DAILY AS DIRECTED   ONETOUCH DELICA LANCETS 33G MISC       PREDNISONE (DELTASONE) 20 MG TABLET    2 tabs by mouth daily for 1 week, then 1 tab daily for 1 week with food.   TIOTROPIUM BROMIDE-OLODATEROL (STIOLTO RESPIMAT) 2.5-2.5 MCG/ACT AERS    Inhale 2 puffs into the lungs.  Modified Medications   No medications on file  Discontinued Medications   No medications on file

## 2015-09-08 ENCOUNTER — Other Ambulatory Visit: Payer: Self-pay | Admitting: Family Medicine

## 2015-09-08 ENCOUNTER — Encounter: Payer: Self-pay | Admitting: *Deleted

## 2015-09-22 ENCOUNTER — Other Ambulatory Visit: Payer: Self-pay | Admitting: Family Medicine

## 2015-10-06 ENCOUNTER — Emergency Department (HOSPITAL_COMMUNITY): Payer: Medicare Other

## 2015-10-06 ENCOUNTER — Encounter (HOSPITAL_COMMUNITY): Payer: Self-pay | Admitting: Emergency Medicine

## 2015-10-06 ENCOUNTER — Observation Stay (HOSPITAL_COMMUNITY)
Admission: EM | Admit: 2015-10-06 | Discharge: 2015-10-09 | Disposition: A | Discharge status: Home Health | Payer: Medicare Other | Attending: Internal Medicine | Admitting: Internal Medicine

## 2015-10-06 DIAGNOSIS — E782 Mixed hyperlipidemia: Secondary | ICD-10-CM | POA: Insufficient documentation

## 2015-10-06 DIAGNOSIS — R531 Weakness: Secondary | ICD-10-CM | POA: Diagnosis not present

## 2015-10-06 DIAGNOSIS — J440 Chronic obstructive pulmonary disease with acute lower respiratory infection: Secondary | ICD-10-CM | POA: Diagnosis not present

## 2015-10-06 DIAGNOSIS — R262 Difficulty in walking, not elsewhere classified: Secondary | ICD-10-CM | POA: Insufficient documentation

## 2015-10-06 DIAGNOSIS — J449 Chronic obstructive pulmonary disease, unspecified: Secondary | ICD-10-CM | POA: Insufficient documentation

## 2015-10-06 DIAGNOSIS — M1990 Unspecified osteoarthritis, unspecified site: Secondary | ICD-10-CM | POA: Diagnosis not present

## 2015-10-06 DIAGNOSIS — I5033 Acute on chronic diastolic (congestive) heart failure: Secondary | ICD-10-CM | POA: Diagnosis not present

## 2015-10-06 DIAGNOSIS — E1165 Type 2 diabetes mellitus with hyperglycemia: Secondary | ICD-10-CM | POA: Insufficient documentation

## 2015-10-06 DIAGNOSIS — J189 Pneumonia, unspecified organism: Secondary | ICD-10-CM | POA: Diagnosis not present

## 2015-10-06 DIAGNOSIS — Z87891 Personal history of nicotine dependence: Secondary | ICD-10-CM | POA: Insufficient documentation

## 2015-10-06 DIAGNOSIS — N400 Enlarged prostate without lower urinary tract symptoms: Secondary | ICD-10-CM | POA: Insufficient documentation

## 2015-10-06 DIAGNOSIS — J9621 Acute and chronic respiratory failure with hypoxia: Secondary | ICD-10-CM | POA: Diagnosis not present

## 2015-10-06 DIAGNOSIS — R0602 Shortness of breath: Secondary | ICD-10-CM | POA: Diagnosis not present

## 2015-10-06 DIAGNOSIS — I2510 Atherosclerotic heart disease of native coronary artery without angina pectoris: Secondary | ICD-10-CM | POA: Insufficient documentation

## 2015-10-06 DIAGNOSIS — E1122 Type 2 diabetes mellitus with diabetic chronic kidney disease: Secondary | ICD-10-CM | POA: Diagnosis not present

## 2015-10-06 DIAGNOSIS — E1121 Type 2 diabetes mellitus with diabetic nephropathy: Secondary | ICD-10-CM | POA: Diagnosis present

## 2015-10-06 DIAGNOSIS — I10 Essential (primary) hypertension: Secondary | ICD-10-CM | POA: Diagnosis not present

## 2015-10-06 DIAGNOSIS — I130 Hypertensive heart and chronic kidney disease with heart failure and stage 1 through stage 4 chronic kidney disease, or unspecified chronic kidney disease: Secondary | ICD-10-CM | POA: Insufficient documentation

## 2015-10-06 DIAGNOSIS — F0390 Unspecified dementia without behavioral disturbance: Secondary | ICD-10-CM | POA: Insufficient documentation

## 2015-10-06 DIAGNOSIS — J432 Centrilobular emphysema: Secondary | ICD-10-CM | POA: Diagnosis not present

## 2015-10-06 LAB — I-STAT TROPONIN, ED: TROPONIN I, POC: 0.01 ng/mL (ref 0.00–0.08)

## 2015-10-06 LAB — BASIC METABOLIC PANEL
Anion gap: 7 (ref 5–15)
BUN: 24 mg/dL — ABNORMAL HIGH (ref 6–20)
CHLORIDE: 102 mmol/L (ref 101–111)
CO2: 28 mmol/L (ref 22–32)
Calcium: 9.4 mg/dL (ref 8.9–10.3)
Creatinine, Ser: 1.35 mg/dL — ABNORMAL HIGH (ref 0.61–1.24)
GFR calc non Af Amer: 46 mL/min — ABNORMAL LOW (ref >60)
GFR, EST AFRICAN AMERICAN: 54 mL/min — AB (ref >60)
Glucose, Bld: 149 mg/dL — ABNORMAL HIGH (ref 65–99)
POTASSIUM: 5.3 mmol/L — AB (ref 3.5–5.1)
SODIUM: 137 mmol/L (ref 135–145)

## 2015-10-06 LAB — GLUCOSE, CAPILLARY: GLUCOSE-CAPILLARY: 187 mg/dL — AB (ref 65–99)

## 2015-10-06 LAB — CBC
HEMATOCRIT: 36.7 % — AB (ref 39.0–52.0)
Hemoglobin: 11.5 g/dL — ABNORMAL LOW (ref 13.0–17.0)
MCH: 29.9 pg (ref 26.0–34.0)
MCHC: 31.3 g/dL (ref 30.0–36.0)
MCV: 95.6 fL (ref 78.0–100.0)
PLATELETS: 407 10*3/uL — AB (ref 150–400)
RBC: 3.84 MIL/uL — AB (ref 4.22–5.81)
RDW: 13.5 % (ref 11.5–15.5)
WBC: 8.6 10*3/uL (ref 4.0–10.5)

## 2015-10-06 LAB — I-STAT CG4 LACTIC ACID, ED: LACTIC ACID, VENOUS: 0.71 mmol/L (ref 0.5–1.9)

## 2015-10-06 LAB — BRAIN NATRIURETIC PEPTIDE: B Natriuretic Peptide: 183.1 pg/mL — ABNORMAL HIGH (ref 0.0–100.0)

## 2015-10-06 MED ORDER — CEFTRIAXONE SODIUM 1 G IJ SOLR
1.0000 g | INTRAMUSCULAR | Status: DC
  Administered 2015-10-07 – 2015-10-08 (×2): 1 g via INTRAVENOUS
  Filled 2015-10-06 (×3): qty 10

## 2015-10-06 MED ORDER — SODIUM CHLORIDE 0.9 % IV BOLUS (SEPSIS)
1000.0000 mL | Freq: Once | INTRAVENOUS | Status: AC
  Administered 2015-10-06: 1000 mL via INTRAVENOUS

## 2015-10-06 MED ORDER — IOPAMIDOL (ISOVUE-370) INJECTION 76%
INTRAVENOUS | Status: AC
  Filled 2015-10-06: qty 100

## 2015-10-06 MED ORDER — METOPROLOL SUCCINATE ER 25 MG PO TB24
25.0000 mg | ORAL_TABLET | Freq: Every day | ORAL | Status: DC
  Administered 2015-10-07 – 2015-10-09 (×3): 25 mg via ORAL
  Filled 2015-10-06 (×3): qty 1

## 2015-10-06 MED ORDER — VITAMIN D 1000 UNITS PO TABS
1000.0000 [IU] | ORAL_TABLET | Freq: Every day | ORAL | Status: DC
  Administered 2015-10-06 – 2015-10-09 (×4): 1000 [IU] via ORAL
  Filled 2015-10-06 (×3): qty 1
  Filled 2015-10-06: qty 3
  Filled 2015-10-06: qty 1

## 2015-10-06 MED ORDER — ALBUTEROL SULFATE (2.5 MG/3ML) 0.083% IN NEBU
5.0000 mg | INHALATION_SOLUTION | Freq: Once | RESPIRATORY_TRACT | Status: AC
  Administered 2015-10-06: 5 mg via RESPIRATORY_TRACT

## 2015-10-06 MED ORDER — AZITHROMYCIN 500 MG PO TABS
500.0000 mg | ORAL_TABLET | ORAL | Status: DC
  Administered 2015-10-07 – 2015-10-08 (×2): 500 mg via ORAL
  Filled 2015-10-06 (×2): qty 1

## 2015-10-06 MED ORDER — ACETAMINOPHEN 325 MG PO TABS
650.0000 mg | ORAL_TABLET | Freq: Every day | ORAL | Status: DC
  Administered 2015-10-06 – 2015-10-08 (×3): 650 mg via ORAL
  Filled 2015-10-06 (×3): qty 2

## 2015-10-06 MED ORDER — ALBUTEROL SULFATE (2.5 MG/3ML) 0.083% IN NEBU: 2.5000 mg | INHALATION_SOLUTION | Freq: Four times a day (QID) | RESPIRATORY_TRACT | Status: DC | PRN

## 2015-10-06 MED ORDER — ARFORMOTEROL TARTRATE 15 MCG/2ML IN NEBU
15.0000 ug | INHALATION_SOLUTION | Freq: Two times a day (BID) | RESPIRATORY_TRACT | Status: DC
  Administered 2015-10-06 – 2015-10-09 (×6): 15 ug via RESPIRATORY_TRACT
  Filled 2015-10-06 (×6): qty 2

## 2015-10-06 MED ORDER — ATORVASTATIN CALCIUM 40 MG PO TABS
40.0000 mg | ORAL_TABLET | Freq: Every day | ORAL | Status: DC
  Administered 2015-10-07 – 2015-10-09 (×3): 40 mg via ORAL
  Filled 2015-10-06 (×3): qty 1

## 2015-10-06 MED ORDER — UMECLIDINIUM BROMIDE 62.5 MCG/INH IN AEPB
1.0000 | INHALATION_SPRAY | Freq: Every day | RESPIRATORY_TRACT | Status: DC
  Administered 2015-10-07 – 2015-10-09 (×3): 1 via RESPIRATORY_TRACT
  Filled 2015-10-06: qty 7

## 2015-10-06 MED ORDER — ACETAMINOPHEN 500 MG PO TABS
1000.0000 mg | ORAL_TABLET | Freq: Once | ORAL | Status: AC
  Administered 2015-10-06: 1000 mg via ORAL
  Filled 2015-10-06: qty 2

## 2015-10-06 MED ORDER — ALBUTEROL SULFATE (2.5 MG/3ML) 0.083% IN NEBU
INHALATION_SOLUTION | RESPIRATORY_TRACT | Status: AC
  Filled 2015-10-06: qty 6

## 2015-10-06 MED ORDER — ISOSORBIDE MONONITRATE ER 30 MG PO TB24
30.0000 mg | ORAL_TABLET | Freq: Every day | ORAL | Status: DC
  Administered 2015-10-06 – 2015-10-09 (×4): 30 mg via ORAL
  Filled 2015-10-06 (×4): qty 1

## 2015-10-06 MED ORDER — FUROSEMIDE 20 MG PO TABS: 20.0000 mg | ORAL_TABLET | Freq: Every day | ORAL | Status: DC | PRN

## 2015-10-06 MED ORDER — DOXAZOSIN MESYLATE 4 MG PO TABS
4.0000 mg | ORAL_TABLET | Freq: Every day | ORAL | Status: DC
  Administered 2015-10-06 – 2015-10-08 (×3): 4 mg via ORAL
  Filled 2015-10-06 (×3): qty 1

## 2015-10-06 MED ORDER — IOPAMIDOL (ISOVUE-370) INJECTION 76%
100.0000 mL | Freq: Once | INTRAVENOUS | Status: AC | PRN
  Administered 2015-10-06: 100 mL via INTRAVENOUS

## 2015-10-06 MED ORDER — ASPIRIN EC 81 MG PO TBEC
81.0000 mg | DELAYED_RELEASE_TABLET | ORAL | Status: DC
  Administered 2015-10-07 – 2015-10-09 (×3): 81 mg via ORAL
  Filled 2015-10-06 (×3): qty 1

## 2015-10-06 MED ORDER — DEXTROSE 5 % IV SOLN
1.0000 g | Freq: Once | INTRAVENOUS | Status: AC
  Administered 2015-10-06: 1 g via INTRAVENOUS
  Filled 2015-10-06: qty 10

## 2015-10-06 MED ORDER — ENOXAPARIN SODIUM 40 MG/0.4ML ~~LOC~~ SOLN
40.0000 mg | SUBCUTANEOUS | Status: DC
Start: 2015-10-06 — End: 2015-10-09
  Administered 2015-10-06 – 2015-10-08 (×3): 40 mg via SUBCUTANEOUS
  Filled 2015-10-06 (×3): qty 0.4

## 2015-10-06 MED ORDER — IPRATROPIUM-ALBUTEROL 0.5-2.5 (3) MG/3ML IN SOLN
3.0000 mL | RESPIRATORY_TRACT | Status: DC
  Administered 2015-10-06 (×2): 3 mL via RESPIRATORY_TRACT
  Filled 2015-10-06 (×2): qty 3

## 2015-10-06 MED ORDER — DEXTROSE 5 % IV SOLN
500.0000 mg | Freq: Once | INTRAVENOUS | Status: AC
  Administered 2015-10-06: 500 mg via INTRAVENOUS
  Filled 2015-10-06: qty 500

## 2015-10-06 MED ORDER — IPRATROPIUM-ALBUTEROL 0.5-2.5 (3) MG/3ML IN SOLN
3.0000 mL | Freq: Four times a day (QID) | RESPIRATORY_TRACT | Status: DC
  Administered 2015-10-07 – 2015-10-09 (×10): 3 mL via RESPIRATORY_TRACT
  Filled 2015-10-06 (×10): qty 3

## 2015-10-06 MED ORDER — INSULIN ASPART 100 UNIT/ML ~~LOC~~ SOLN
0.0000 [IU] | Freq: Three times a day (TID) | SUBCUTANEOUS | Status: DC
  Administered 2015-10-07: 3 [IU] via SUBCUTANEOUS
  Administered 2015-10-07: 2 [IU] via SUBCUTANEOUS
  Administered 2015-10-07: 3 [IU] via SUBCUTANEOUS
  Administered 2015-10-08: 11 [IU] via SUBCUTANEOUS
  Administered 2015-10-08: 8 [IU] via SUBCUTANEOUS

## 2015-10-06 NOTE — ED Notes (Signed)
Attempted report 

## 2015-10-06 NOTE — H&P (Signed)
History and Physical    Timothy Cordova RUE:[id number] DOB: 09/26/1928 DOA: 10/06/2015   PCP: Hannah BeatSpencer Copland, MD Chief Complaint:  Chief Complaint  Patient presents with  . Shortness of Breath    HPI: Timothy Cordova is a 82 y.o. male with medical history significant of COPD, patient presents to the ED with c/o SOB worsening over the past 2 weeks.  Fever, cough at home.  Symptoms are severe, onset 2 weeks ago, worsening.  4 weeks ago had COPD exacerbation treated with steroids and levaquin course, this improved but then symptoms began to worsen starting 2 weeks ago.  Associated poor appetite  ED Course: Tm 101.9, found to have R lung PNA on CTA chest.  Review of Systems: As per HPI otherwise 10 point review of systems negative.    Past Medical History  Diagnosis Date  . Coronary artery disease     s/p CABG 2006. LHC (1/10): SVG-OM and PLOM, SVG-D, and LIMA-LAD were all patent  . Hypertension   . COPD (chronic obstructive pulmonary disease) (HCC)     moderate. followed by Dr.Byrum  . Chronic diastolic heart failure (HCC)     8/08 ECHO with EF 55%  . Edema     localized. suspect diastolic CHF + venous insufficiency  . Mixed hyperlipidemia   . Sleep apnea     ?  . Diabetes mellitus   . History of tobacco abuse   . Allergic rhinitis   . Hypercholesterolemia   . Benign prostatic hypertrophy   . Osteoarthritis   . Dementia     (mild)--06/2006  . Hearing loss     left >>right  . CKD (chronic kidney disease)     Past Surgical History  Procedure Laterality Date  . Angioplasty  [id number]  . Rotator cuff repair  2005    left, [place]  . Coronary artery bypass graft  02/2005  . Cardiac catheterization  90s    [place], [place] x1stent  . Cardiac catheterization      [facility];x2 stents  . Cardiac catheterization  04/29/2013     reports that he quit smoking about 10 years ago. His smoking use included Cigarettes. He has a 180 pack-year smoking history. He has never used  smokeless tobacco. He reports that he does not drink alcohol or use illicit drugs.  Allergies  Allergen Reactions  . Metformin And Related   . Tramadol Hcl     Tremor vs seizure activity  . Ace Inhibitors     REACTION: cough  . Atorvastatin     REACTION: weakness  . Codeine Nausea And Vomiting  . Morphine Sulfate     REACTION: unspecified  . Ticlopidine Hcl     REACTION: unspecified  . Warfarin Sodium Other (See Comments)    Nausea and vomiting     Family History  Problem Relation Age of Onset  . Heart attack Mother   . Lung cancer Sister       Prior to Admission medications   Medication Sig Start Date End Date Taking? Authorizing Provider  acetaminophen (TYLENOL) 325 MG tablet Take 650 mg by mouth at bedtime.   Yes Historical Provider, MD  albuterol (PROVENTIL HFA;VENTOLIN HFA) 108 (90 Base) MCG/ACT inhaler Inhale 2 puffs into the lungs every 6 (six) hours as needed for wheezing. 08/31/15  Yes Joaquim NamGraham S Duncan, MD  aspirin 81 MG EC tablet Take 81 mg by mouth every morning.    Yes Historical Provider, MD  atorvastatin (LIPITOR) 80 MG tablet Take 40  mg by mouth daily.   Yes Historical Provider, MD  Cholecalciferol (VITAMIN D-3 PO) Take 1,000 Units by mouth daily.    Yes Historical Provider, MD  doxazosin (CARDURA) 4 MG tablet TAKE 1 TO 2 TABLETS BY MOUTH EVERY DAY AS DIRECTED. Patient taking differently: TAKE 1 TABLETS BY MOUTH EVERY evening 01/07/15  Yes Spencer Copland, MD  furosemide (LASIX) 20 MG tablet Take 20 mg by mouth daily as needed for fluid.    Yes Historical Provider, MD  GLIPIZIDE XL 10 MG 24 hr tablet TAKE TWO (2) TABLETS BY MOUTH DAILY Patient taking differently: TAKE One (1) TABLET (10 mg) BY MOUTH twice a day 09/22/15  Yes Spencer Copland, MD  isosorbide mononitrate (IMDUR) 30 MG 24 hr tablet Take 1 tablet (30 mg total) by mouth daily. 05/07/13  Yes Spencer Copland, MD  levofloxacin (LEVAQUIN) 500 MG tablet Take 1 tablet (500 mg total) by mouth daily. 08/31/15  Yes  Joaquim NamGraham S Duncan, MD  metoprolol succinate (TOPROL-XL) 50 MG 24 hr tablet Take 25 mg by mouth daily. Take with or immediately following a meal.   Yes Historical Provider, MD  Multiple Vitamins-Minerals (PRESERVISION AREDS PO) Take 1 tablet by mouth daily.    Yes Historical Provider, MD  ONE TOUCH ULTRA TEST test strip USE TO CHECK BLOOD SUGAR 2 TIMES DAILY AS DIRECTED 09/08/15  Yes Hannah BeatSpencer Copland, MD  Letta PateNETOUCH DELICA LANCETS 33G MISC  02/13/13  Yes Historical Provider, MD  Tiotropium Bromide-Olodaterol (STIOLTO RESPIMAT) 2.5-2.5 MCG/ACT AERS Inhale 2 puffs into the lungs every morning.    Yes Historical Provider, MD    Physical Exam: Filed Vitals:   10/06/15 1900 10/06/15 1938 10/06/15 2000 10/06/15 2100  BP: 158/79  127/50 131/47  Pulse: 97  89 82  Temp:  101.9 F (38.8 C)    TempSrc:  Rectal    Resp: 27  38 34  Height:      Weight:      SpO2: 96%  93% 95%      Constitutional: NAD, calm, comfortable Eyes: PERRL, lids and conjunctivae normal ENMT: Mucous membranes are moist. Posterior pharynx clear of any exudate or lesions.Normal dentition.  Neck: normal, supple, no masses, no thyromegaly Respiratory: Faint wheezes in R lung Cardiovascular: Regular rate and rhythm, no murmurs / rubs / gallops. No extremity edema. 2+ pedal pulses. No carotid bruits.  Abdomen: no tenderness, no masses palpated. No hepatosplenomegaly. Bowel sounds positive.  Musculoskeletal: no clubbing / cyanosis. No joint deformity upper and lower extremities. Good ROM, no contractures. Normal muscle tone.  Skin: no rashes, lesions, ulcers. No induration Neurologic: CN 2-12 grossly intact. Sensation intact, DTR normal. Strength 5/5 in all 4.  Psychiatric: Normal judgment and insight. Alert and oriented x 3. Normal mood.    Labs on Admission: I have personally reviewed following labs and imaging studies  CBC:  Recent Labs Lab 10/06/15 1605  WBC 8.6  HGB 11.5*  HCT 36.7*  MCV 95.6  PLT 407*   Basic  Metabolic Panel:  Recent Labs Lab 10/06/15 1605  NA 137  K 5.3*  CL 102  CO2 28  GLUCOSE 149*  BUN 24*  CREATININE 1.35*  CALCIUM 9.4   GFR: Estimated Creatinine Clearance: 40.5 mL/min (by C-G formula based on Cr of 1.35). Liver Function Tests: No results for input(s): AST, ALT, ALKPHOS, BILITOT, PROT, ALBUMIN in the last 168 hours. No results for input(s): LIPASE, AMYLASE in the last 168 hours. No results for input(s): AMMONIA in the last 168 hours. Coagulation  Profile: No results for input(s): INR, PROTIME in the last 168 hours. Cardiac Enzymes: No results for input(s): CKTOTAL, CKMB, CKMBINDEX, TROPONINI in the last 168 hours. BNP (last 3 results)  Recent Labs  11/26/14 1008  PROBNP 152.0*   HbA1C: No results for input(s): HGBA1C in the last 72 hours. CBG: No results for input(s): GLUCAP in the last 168 hours. Lipid Profile: No results for input(s): CHOL, HDL, LDLCALC, TRIG, CHOLHDL, LDLDIRECT in the last 72 hours. Thyroid Function Tests: No results for input(s): TSH, T4TOTAL, FREET4, T3FREE, THYROIDAB in the last 72 hours. Anemia Panel: No results for input(s): VITAMINB12, FOLATE, FERRITIN, TIBC, IRON, RETICCTPCT in the last 72 hours. Urine analysis: No results found for: COLORURINE, APPEARANCEUR, LABSPEC, PHURINE, GLUCOSEU, HGBUR, BILIRUBINUR, KETONESUR, PROTEINUR, UROBILINOGEN, NITRITE, LEUKOCYTESUR Sepsis Labs: (procalcitonin:4,lacticidven:4) )No results found for this or any previous visit (from the past 240 hour(s)).   Radiological Exams on Admission: Dg Chest 2 View  10/06/2015  CLINICAL DATA:  Two week history of shortness of breath. EXAM: CHEST  2 VIEW COMPARISON:  CT scan 11/30/2014.  Chest x-ray 11/26/2014. FINDINGS: Two views study shows hyperexpansion. Diffuse interstitial and alveolar opacity in the right upper lung is new in the interval. There is some nodularity in fullness in the right hilum which was not present on the previous chest  x-ray. Interstitial markings are diffusely coarsened with chronic features. The cardiopericardial silhouette is within normal limits for size. Patient is status post CABG IMPRESSION: Interval development of interstitial and alveolar opacity in the right upper lung with some fullness in the right hilum. Imaging features raise concern for central lesion with postobstructive process. CT chest with contrast recommended to further evaluate. Electronically Signed   By: Kennith Center M.D.   On: 10/06/2015 18:48   Ct Angio Chest Pe W Or Wo Contrast  10/06/2015  CLINICAL DATA:  82 year old male with shortness of breath and tachycardia EXAM: CT ANGIOGRAPHY CHEST WITH CONTRAST TECHNIQUE: Multidetector CT imaging of the chest was performed using the standard protocol during bolus administration of intravenous contrast. Multiplanar CT image reconstructions and MIPs were obtained to evaluate the vascular anatomy. CONTRAST:  70 cc Isovue 370 COMPARISON:  Chest radiograph dated 10/06/2015 and CT dated 11/30/2014 FINDINGS: There is emphysematous changes of the lungs. There is a large area of interstitial prominence and airspace density involving the right upper lobe, right middle lobe, and right lower lobe most compatible with pneumonia. A 2.0 x 3.5 cm more confluent density in the right upper lobe (series 5, image 43) also most likely an area of consolidation/infiltrate. Underlying mass is less likely but not excluded. Clinical correlation is recommended. There is a small right pleural effusion. A 1.9 x 4.8 cm right lower lobe subpleural bleb noted. The left lung is clear. There is no pneumothorax. The central airways are patent. There is atherosclerotic calcification of the thoracic aorta. There is no aneurysmal dilatation or evidence of dissection. There is a vascular stent at the origin of the left subclavian artery. Evaluation of the great vessels of the aortic arch and left subclavian artery stent is limited due to timing  of the contrast. Evaluation of the pulmonary arteries is limited due to respiratory motion artifact and suboptimal visualization of the peripheral branches. No central pulmonary artery embolus identified. There is no cardiomegaly or pericardial effusion. There is coronary vascular calcification. CABG vascular clips noted. Set right hilar adenopathy. There is no mediastinal adenopathy. The esophagus is grossly unremarkable. No thyroid nodules identified. There is no axillary adenopathy.  The chest wall soft tissues appear unremarkable. There is osteopenia with degenerative changes of the spine. No acute fracture. The visualized upper abdomen appear unremarkable. Review of the MIP images confirms the above findings. IMPRESSION: CT evidence of pulmonary embolism. Large area of interstitial and airspace opacity involving the right lung most compatible with pneumonia. Clinical correlation and follow-up resolution recommended. Small right pleural effusion. Electronically Signed   By: Elgie Collard M.D.   On: 10/06/2015 21:04    EKG: Independently reviewed.  Assessment/Plan Principal Problem:   CAP (community acquired pneumonia) Active Problems:   DM (diabetes mellitus), type 2 with renal complications (HCC)   Essential hypertension   Centrilobular emphysema (HCC)   Pneumonia involving right lung    CAP -  pna pathway  Rocephin / azithromycin  Cultures pending  COPD -  Neb treatments and adult wheeze protocol  Will hold off on steroids for now due to already improved respiratory status in ED.  HTN - continue home meds  DM2 - holding glipizide  Moderate dose SSI AC/HS  CKD stage 3 - chronic and baseline   DVT prophylaxis: Lovenox Code Status: Full Family Communication: Family at bedside Consults called: None Admission status: Admit to inpatient   Hillary Bow DO [facility] Pager [phone] from 7PM-7AM  If 7AM-7PM, please contact the day physician for the  patient [email or web address] Password [email or web address]  10/06/2015, 10:06 PM

## 2015-10-06 NOTE — ED Notes (Signed)
Patient complains of increased shortness of breath x 2 weeks, denies cough, denies chest pain. States that he thinks its his emphysema. Speaking complete sentences but congestion noted

## 2015-10-06 NOTE — ED Notes (Signed)
Brought pt back to room and he began to breath quickly with an O2 at 94% placed on 2 L via [place].

## 2015-10-06 NOTE — ED Notes (Signed)
Patient transported to CT 

## 2015-10-06 NOTE — ED Provider Notes (Signed)
CSN: [id number]     Arrival date & time 10/06/15  1550 History   First MD Initiated Contact with Patient 10/06/15 1723     Chief Complaint  Patient presents with  . Shortness of Breath      HPI  Patient presents for evaluation of shortness of breath.  His wife catches me outside of the room and states that for the last 2 weeks he's had no energy and no appetite. She is concerned that he is "given up" as he is barely able to get out of bed and is weak and won't eat.  However, as I speak with him he states that he's been feverish and chilled at home. He has not had a cough. He feels more short of breath than usual at home and is barely able to function around the house due to difficulty breathing. He does use inhalers but states they helped very little.  He's not had chest pain. No frank riders. No headache or sore throat or URI symptoms. No increase or change in quality or quantity of sputum. No PND or orthopnea no extremity swelling. Poor appetite but no nausea or vomiting.  History of centrilobular emphysema. Is not on home O2.    Past Medical History  Diagnosis Date  . Coronary artery disease     s/p CABG 2006. LHC (1/10): SVG-OM and PLOM, SVG-D, and LIMA-LAD were all patent  . Hypertension   . COPD (chronic obstructive pulmonary disease) (HCC)     moderate. followed by Dr.Byrum  . Chronic diastolic heart failure (HCC)     8/08 ECHO with EF 55%  . Edema     localized. suspect diastolic CHF + venous insufficiency  . Mixed hyperlipidemia   . Sleep apnea     ?  . Diabetes mellitus   . History of tobacco abuse   . Allergic rhinitis   . Hypercholesterolemia   . Benign prostatic hypertrophy   . Osteoarthritis   . Dementia     (mild)--06/2006  . Hearing loss     left >>right  . CKD (chronic kidney disease)    Past Surgical History  Procedure Laterality Date  . Angioplasty  [id number]  . Rotator cuff repair  2005    left, [place]  . Coronary artery bypass graft  02/2005    . Cardiac catheterization  90s    [place], [place] x1stent  . Cardiac catheterization      [facility];x2 stents  . Cardiac catheterization  04/29/2013   Family History  Problem Relation Age of Onset  . Heart attack Mother   . Lung cancer Sister    Social History  Substance Use Topics  . Smoking status: Former Smoker -- 3.00 packs/day for 60 years    Types: Cigarettes    Quit date: 07/12/2005  . Smokeless tobacco: Never Used  . Alcohol Use: No    Review of Systems  Constitutional: Positive for fever, chills, activity change, appetite change and fatigue. Negative for diaphoresis.  HENT: Negative for mouth sores, sore throat and trouble swallowing.   Eyes: Negative for visual disturbance.  Respiratory: Positive for shortness of breath. Negative for cough, chest tightness and wheezing.   Cardiovascular: Negative for chest pain.  Gastrointestinal: Negative for nausea, vomiting, abdominal pain, diarrhea and abdominal distention.  Endocrine: Negative for polydipsia, polyphagia and polyuria.  Genitourinary: Negative for dysuria, frequency and hematuria.  Musculoskeletal: Negative for gait problem.  Skin: Negative for color change, pallor and rash.  Neurological: Negative for dizziness, syncope, light-headedness  and headaches.  Hematological: Does not bruise/bleed easily.  Psychiatric/Behavioral: Negative for behavioral problems and confusion.      Allergies  Metformin and related; Tramadol hcl; Ace inhibitors; Atorvastatin; Codeine; Morphine sulfate; Ticlopidine hcl; and Warfarin sodium  Home Medications   Prior to Admission medications   Medication Sig Start Date End Date Taking? Authorizing Provider  acetaminophen (TYLENOL) 325 MG tablet Take 650 mg by mouth at bedtime.   Yes Historical Provider, MD  albuterol (PROVENTIL HFA;VENTOLIN HFA) 108 (90 Base) MCG/ACT inhaler Inhale 2 puffs into the lungs every 6 (six) hours as needed for wheezing. 08/31/15  Yes Joaquim Nam, MD  aspirin 81  MG EC tablet Take 81 mg by mouth every morning.    Yes Historical Provider, MD  atorvastatin (LIPITOR) 80 MG tablet Take 40 mg by mouth daily.   Yes Historical Provider, MD  Cholecalciferol (VITAMIN D-3 PO) Take 1,000 Units by mouth daily.    Yes Historical Provider, MD  doxazosin (CARDURA) 4 MG tablet TAKE 1 TO 2 TABLETS BY MOUTH EVERY DAY AS DIRECTED. Patient taking differently: TAKE 1 TABLETS BY MOUTH EVERY evening 01/07/15  Yes Spencer Copland, MD  furosemide (LASIX) 20 MG tablet Take 20 mg by mouth daily as needed for fluid.    Yes Historical Provider, MD  GLIPIZIDE XL 10 MG 24 hr tablet TAKE TWO (2) TABLETS BY MOUTH DAILY Patient taking differently: TAKE One (1) TABLET (10 mg) BY MOUTH twice a day 09/22/15  Yes Spencer Copland, MD  isosorbide mononitrate (IMDUR) 30 MG 24 hr tablet Take 1 tablet (30 mg total) by mouth daily. 05/07/13  Yes Spencer Copland, MD  levofloxacin (LEVAQUIN) 500 MG tablet Take 1 tablet (500 mg total) by mouth daily. 08/31/15  Yes Joaquim Nam, MD  metoprolol succinate (TOPROL-XL) 50 MG 24 hr tablet Take 25 mg by mouth daily. Take with or immediately following a meal.   Yes Historical Provider, MD  Multiple Vitamins-Minerals (PRESERVISION AREDS PO) Take 1 tablet by mouth daily.    Yes Historical Provider, MD  ONE TOUCH ULTRA TEST test strip USE TO CHECK BLOOD SUGAR 2 TIMES DAILY AS DIRECTED 09/08/15  Yes Hannah Beat, MD  Letta Pate DELICA LANCETS 33G MISC  02/13/13  Yes Historical Provider, MD  Tiotropium Bromide-Olodaterol (STIOLTO RESPIMAT) 2.5-2.5 MCG/ACT AERS Inhale 2 puffs into the lungs every morning.    Yes Historical Provider, MD   BP 131/47 mmHg  Pulse 82  Temp(Src) 101.9 F (38.8 C) (Rectal)  Resp 34  Ht  (1.702 m)  Wt 176 lb (79.833 kg)  BMI 27.56 kg/m2  SpO2 95% Physical Exam  Constitutional: He is oriented to person, place, and time. No distress.  Appears to not feel well per not frankly toxic. Mentating well.  HENT:  Head: Normocephalic.   Eyes: Conjunctivae are normal. Pupils are equal, round, and reactive to light. No scleral icterus.  Neck: Normal range of motion. Neck supple. No thyromegaly present.  Cardiovascular: Normal rate and regular rhythm.  Exam reveals no gallop and no friction rub.   No murmur heard. Pulmonary/Chest: Effort normal and breath sounds normal. No respiratory distress. He has no wheezes. He has no rales.    Abdominal: Soft. Bowel sounds are normal. He exhibits no distension. There is no tenderness. There is no rebound.  Musculoskeletal: Normal range of motion.  Neurological: He is alert and oriented to person, place, and time.  Skin: Skin is warm and dry. No rash noted.  Psychiatric: He has a normal mood  and affect. His behavior is normal.    ED Course  Procedures (including critical care time) Labs Review Labs Reviewed  BASIC METABOLIC PANEL - Abnormal; Notable for the following:    Potassium 5.3 (*)    Glucose, Bld 149 (*)    BUN 24 (*)    Creatinine, Ser 1.35 (*)    GFR calc non Af Amer 46 (*)    GFR calc Af Amer 54 (*)    All other components within normal limits  CBC - Abnormal; Notable for the following:    RBC 3.84 (*)    Hemoglobin 11.5 (*)    HCT 36.7 (*)    Platelets 407 (*)    All other components within normal limits  BRAIN NATRIURETIC PEPTIDE - Abnormal; Notable for the following:    B Natriuretic Peptide 183.1 (*)    All other components within normal limits  CULTURE, BLOOD (ROUTINE X 2)  CULTURE, BLOOD (ROUTINE X 2)  URINE CULTURE  CULTURE, EXPECTORATED SPUTUM-ASSESSMENT  GRAM STAIN  URINALYSIS, ROUTINE W REFLEX MICROSCOPIC (NOT AT [facility])  HIV ANTIBODY (ROUTINE TESTING)  STREP PNEUMONIAE URINARY ANTIGEN  I-STAT TROPOININ, ED  I-STAT CG4 LACTIC ACID, ED    Imaging Review Dg Chest 2 View  10/06/2015  CLINICAL DATA:  Two week history of shortness of breath. EXAM: CHEST  2 VIEW COMPARISON:  CT scan 11/30/2014.  Chest x-ray 11/26/2014. FINDINGS: Two views study shows  hyperexpansion. Diffuse interstitial and alveolar opacity in the right upper lung is new in the interval. There is some nodularity in fullness in the right hilum which was not present on the previous chest x-ray. Interstitial markings are diffusely coarsened with chronic features. The cardiopericardial silhouette is within normal limits for size. Patient is status post CABG IMPRESSION: Interval development of interstitial and alveolar opacity in the right upper lung with some fullness in the right hilum. Imaging features raise concern for central lesion with postobstructive process. CT chest with contrast recommended to further evaluate. Electronically Signed   By: Kennith Center M.D.   On: 10/06/2015 18:48   Ct Angio Chest Pe W Or Wo Contrast  10/06/2015  CLINICAL DATA:  82 year old male with shortness of breath and tachycardia EXAM: CT ANGIOGRAPHY CHEST WITH CONTRAST TECHNIQUE: Multidetector CT imaging of the chest was performed using the standard protocol during bolus administration of intravenous contrast. Multiplanar CT image reconstructions and MIPs were obtained to evaluate the vascular anatomy. CONTRAST:  70 cc Isovue 370 COMPARISON:  Chest radiograph dated 10/06/2015 and CT dated 11/30/2014 FINDINGS: There is emphysematous changes of the lungs. There is a large area of interstitial prominence and airspace density involving the right upper lobe, right middle lobe, and right lower lobe most compatible with pneumonia. A 2.0 x 3.5 cm more confluent density in the right upper lobe (series 5, image 43) also most likely an area of consolidation/infiltrate. Underlying mass is less likely but not excluded. Clinical correlation is recommended. There is a small right pleural effusion. A 1.9 x 4.8 cm right lower lobe subpleural bleb noted. The left lung is clear. There is no pneumothorax. The central airways are patent. There is atherosclerotic calcification of the thoracic aorta. There is no aneurysmal dilatation or  evidence of dissection. There is a vascular stent at the origin of the left subclavian artery. Evaluation of the great vessels of the aortic arch and left subclavian artery stent is limited due to timing of the contrast. Evaluation of the pulmonary arteries is limited due to respiratory motion  artifact and suboptimal visualization of the peripheral branches. No central pulmonary artery embolus identified. There is no cardiomegaly or pericardial effusion. There is coronary vascular calcification. CABG vascular clips noted. Set right hilar adenopathy. There is no mediastinal adenopathy. The esophagus is grossly unremarkable. No thyroid nodules identified. There is no axillary adenopathy. The chest wall soft tissues appear unremarkable. There is osteopenia with degenerative changes of the spine. No acute fracture. The visualized upper abdomen appear unremarkable. Review of the MIP images confirms the above findings. IMPRESSION: CT evidence of pulmonary embolism. Large area of interstitial and airspace opacity involving the right lung most compatible with pneumonia. Clinical correlation and follow-up resolution recommended. Small right pleural effusion. Electronically Signed   By: Elgie CollardArash  Radparvar M.D.   On: 10/06/2015 21:04   I have personally reviewed and evaluated these images and lab results as part of my medical decision-making.   EKG Interpretation   Date/Time:  Wednesday October 06 2015 15:54:32 EDT Ventricular Rate:  104 PR Interval:  220 QRS Duration: 132 QT Interval:  336 QTC Calculation: 441 R Axis:   -73 Text Interpretation:  Sinus tachycardia with 1st degree A-V block Left  axis deviation Right bundle branch block -old Inferior Q waves--noted 1-17  Abnormal ECG Confirmed by Fayrene FearingJAMES  MD, Monzerrath Mcburney ([id number]) on 10/06/2015 6:01:45 PM      MDM   Final diagnoses:  Community acquired pneumonia    Exam suggest pneumonia. Initial sat in temperature from triage were reassuring with temp 99.4 and pulse  ox of 95. Even transferring from wheelchair to bed he desaturates to the upper 80s. Recheck temperature at the bedside by me is 100.5 rectal temp to 101.9.  Chest x-ray suggests right upper lobe pneumonia but also suggest central mass. CT N0 shows consolidation but no obvious mass or other stigmata of malignancy. Given IV antibiotics. His lactate is not elevated. Is not hypotensive. Tachycardia improves. Discussed with Dr. Julian ReilGardner he will be admitted.    Rolland PorterMark Gurjot Brisco, MD 10/06/15 2205

## 2015-10-07 DIAGNOSIS — J189 Pneumonia, unspecified organism: Secondary | ICD-10-CM | POA: Diagnosis not present

## 2015-10-07 DIAGNOSIS — I10 Essential (primary) hypertension: Secondary | ICD-10-CM | POA: Diagnosis not present

## 2015-10-07 DIAGNOSIS — N183 Chronic kidney disease, stage 3 (moderate): Secondary | ICD-10-CM

## 2015-10-07 DIAGNOSIS — J449 Chronic obstructive pulmonary disease, unspecified: Secondary | ICD-10-CM

## 2015-10-07 DIAGNOSIS — J432 Centrilobular emphysema: Secondary | ICD-10-CM | POA: Diagnosis not present

## 2015-10-07 DIAGNOSIS — E1122 Type 2 diabetes mellitus with diabetic chronic kidney disease: Secondary | ICD-10-CM | POA: Diagnosis not present

## 2015-10-07 DIAGNOSIS — I5033 Acute on chronic diastolic (congestive) heart failure: Secondary | ICD-10-CM | POA: Diagnosis not present

## 2015-10-07 LAB — URINALYSIS, ROUTINE W REFLEX MICROSCOPIC
Bilirubin Urine: NEGATIVE
GLUCOSE, UA: NEGATIVE mg/dL
Hgb urine dipstick: NEGATIVE
Ketones, ur: NEGATIVE mg/dL
LEUKOCYTES UA: NEGATIVE
Nitrite: NEGATIVE
PH: 5.5 (ref 5.0–8.0)
PROTEIN: 100 mg/dL — AB
SPECIFIC GRAVITY, URINE: 1.037 — AB (ref 1.005–1.030)

## 2015-10-07 LAB — HIV ANTIBODY (ROUTINE TESTING W REFLEX): HIV Screen 4th Generation wRfx: NONREACTIVE

## 2015-10-07 LAB — STREP PNEUMONIAE URINARY ANTIGEN: STREP PNEUMO URINARY ANTIGEN: NEGATIVE

## 2015-10-07 LAB — GLUCOSE, CAPILLARY
GLUCOSE-CAPILLARY: 133 mg/dL — AB (ref 65–99)
Glucose-Capillary: 179 mg/dL — ABNORMAL HIGH (ref 65–99)
Glucose-Capillary: 166 mg/dL — ABNORMAL HIGH (ref 65–99)

## 2015-10-07 LAB — URINE MICROSCOPIC-ADD ON

## 2015-10-07 MED ORDER — BUDESONIDE 0.5 MG/2ML IN SUSP
0.5000 mg | Freq: Two times a day (BID) | RESPIRATORY_TRACT | Status: DC
  Administered 2015-10-07 – 2015-10-09 (×4): 0.5 mg via RESPIRATORY_TRACT
  Filled 2015-10-07 (×4): qty 2

## 2015-10-07 MED ORDER — GLUCERNA PO LIQD: 237.0000 mL | Freq: Two times a day (BID) | ORAL | Status: DC

## 2015-10-07 MED ORDER — PREDNISONE 50 MG PO TABS
50.0000 mg | ORAL_TABLET | Freq: Every day | ORAL | Status: DC
  Administered 2015-10-08 – 2015-10-09 (×2): 50 mg via ORAL
  Filled 2015-10-07 (×2): qty 1

## 2015-10-07 NOTE — Care Management Obs Status (Signed)
MEDICARE OBSERVATION STATUS NOTIFICATION   Patient Details  Name: Timothy Cordova MRN: [id number] Date of Birth: [birth year]   Medicare Observation Status Notification Given:  Yes    Kingsley PlanWile, Kentavious Michele Marie, RN 10/07/2015, 2:53 PM

## 2015-10-07 NOTE — Progress Notes (Signed)
Initial Nutrition Assessment  DOCUMENTATION CODES:   Not applicable  INTERVENTION:   -Magic Cup TID with meals each supplement provides 290 kcals and 9 grams protein  NUTRITION DIAGNOSIS:   Inadequate oral intake related to poor appetite as evidenced by per patient/family report, percent weight loss.  GOAL:   Patient will meet greater than or equal to 90% of their needs  MONITOR:   PO intake, Supplement acceptance, Labs, Weight trends, Skin, I & O's  REASON FOR ASSESSMENT:   Consult Assessment of nutrition requirement/status  ASSESSMENT:   Timothy Cordova is a 82 y.o. male with medical history significant of COPD, patient presents to the ED with c/o SOB worsening over the past 2 weeks. Fever, cough at home. Symptoms are severe, onset 2 weeks ago, worsening. 4 weeks ago had COPD exacerbation treated with steroids and levaquin course, this improved but then symptoms began to worsen starting 2 weeks ago. Associated poor appetite  Pt admitted with CAP.   Hx obtained from pt, pt wife, and family friend at bedside. Pt reports a general decline in health over the past 3 weeks, due to weakness, difficulty breathing, and poor appetite. Pt wife estimates that while he still eats 3 meals per day, he is generally consuming about 60% less of what he usually eats. Pt confirms this statement, but also reports that appetite has been improved since admission.   Pt reports UBW around 190#. Pt estimates he has lost about 20# over the past 3 weeks. While not consistent with pt report, pt has experienced a 5.7% wt loss over the past month. Pt also reports increase in CBGS, which have been ranging between 160-200 over the past few weeks.  Nutrition-Focused physical exam completed. Findings are no fat depletion, mild muscle depletion, and no edema. Pt reports decline in mobility due to illness.   Pt has access to Ensure and Glucerna supplements at home, however, pt wife reports he refuses to  consume them. He is amenable to [employer]. Discussed importance of good nutritional intake to support healing.   Labs reviewed: CBGS: 133-187.   Diet Order:  Diet Carb Modified Fluid consistency:: Thin; Room service appropriate?: Yes  Skin:  Reviewed, no issues  Last BM:  10/04/15  Height:   Ht Readings from Last 1 Encounters:  10/06/15 5\' 8"  (1.727 m)    Weight:   Wt Readings from Last 1 Encounters:  10/06/15 180 lb 8.9 oz (81.9 kg)    Ideal Body Weight:  70 kg  BMI:  Body mass index is 27.46 kg/(m^2).  Estimated Nutritional Needs:   Kcal:  1750-1950  Protein:  85-100 grams  Fluid:  1.7-1.9 L  EDUCATION NEEDS:   Education needs addressed  Delwyn Scoggin A. Mayford KnifeWilliams, RD, LDN, CDE Pager: [phone] After hours Pager: [phone]

## 2015-10-07 NOTE — Progress Notes (Signed)
PROGRESS NOTE  Timothy Cordova  WGN:[id number] DOB: [birth year] DOA: 10/06/2015 PCP: Hannah Beat, MD  Brief Narrative:   Timothy Cordova is a 82 y.o. male with medical history significant of COPD who presented to the ER withprogressively worsening SOB worsening over the past 2 weeks. Fever, cough at home. 4 weeks ago had COPD exacerbation treated with steroids and levaquin course but did not feel better and continued to feel worse. Associated poor appetite and a 20-lb weight loss in the last month.  In the ER, fever of 101.9 and CT of the chest demonstrated a right lung pneumonia.    Assessment & Plan:   Principal Problem:   CAP (community acquired pneumonia) Active Problems:   DM (diabetes mellitus), type 2 with renal complications (HCC)   Essential hypertension   Centrilobular emphysema (HCC)   Pneumonia involving right lung   Chronic obstructive pulmonary disease (HCC)  Acute hypoxic respiratory failure secondary to community-acquired pneumonia, worrisome that symptoms have been present for a month -  Continue ceftriaxone and azithromycin day 2 -  Blood cultures no growth to date -  Pulmonary consultation to reevaluate CT and determine if any further work up for malignancy should occur during this hospitalization >> repeat CXR in 2 weeks and CT chest once CXR has cleared  COPD with wheezing -  Start prednisone 50 mg daily -  Add budesonide -  Continue brovana and DuoNebs  HTN, blood pressures elevated  -  Continued doxazosin, imdur, and metoprolol  DM2 -  Continue moderate dose SSI but will likely need increased to high-dose after starting steroids or add standing insulin with meals  CKD stage 3 - chronic and baseline  20 pound weight loss over last month.  This could suggest underlying malignancy -  Nutrition consultation -  Regular diet -  Add supplements   DVT prophylaxis: Lovenox Code Status: Full Family Communication: patient alone Disposition:   Likely to home in a few days   Consultants:  Pulmonology  Procedures:  CT chest  Antimicrobials:   Ceftriaxone and azithromycin from 6/28    Subjective: States he feels a lot better than yesterday. He thought he was going to die yesterday.   Objective: Filed Vitals:   10/07/15 0938 10/07/15 1202 10/07/15 1420 10/07/15 1607  BP:   152/62   Pulse:   98   Temp:   100 F (37.8 C)   TempSrc:   Oral   Resp:   20   Height:      Weight:      SpO2: 91% 95% 94% 93%    Intake/Output Summary (Last 24 hours) at 10/07/15 1747 Last data filed at 10/07/15 1419  Gross per 24 hour  Intake      0 ml  Output    550 ml  Net   -550 ml   Filed Weights   10/06/15 1600 10/06/15 2330  Weight: 79.833 kg (176 lb) 81.9 kg (180 lb 8.9 oz)    Examination:  General exam:  Adult Male. mild SCM retractions and needed to take some breaks while talking, nasal cannula in place  HEENT:  NCAT, MMM Respiratory system: Diminished bilateral breath sounds with high-pitched expiratory wheeze and prolonged expiratory phase  Cardiovascular system: Regular rate and rhythm, normal S1/S2. No murmurs, rubs, gallops or clicks.  Warm extremities Gastrointestinal system: Normal active bowel sounds, soft, nondistended, nontender. MSK:  Normal tone and bulk, no lower extremity edema Neuro:  Grossly intact    Data Reviewed: I  have personally reviewed following labs and imaging studies  CBC:  Recent Labs Lab 10/06/15 1605  WBC 8.6  HGB 11.5*  HCT 36.7*  MCV 95.6  PLT 407*   Basic Metabolic Panel:  Recent Labs Lab 10/06/15 1605  NA 137  K 5.3*  CL 102  CO2 28  GLUCOSE 149*  BUN 24*  CREATININE 1.35*  CALCIUM 9.4   GFR: Estimated Creatinine Clearance: 38.7 mL/min (by C-G formula based on Cr of 1.35). Liver Function Tests: No results for input(s): AST, ALT, ALKPHOS, BILITOT, PROT, ALBUMIN in the last 168 hours. No results for input(s): LIPASE, AMYLASE in the last 168 hours. No results for  input(s): AMMONIA in the last 168 hours. Coagulation Profile: No results for input(s): INR, PROTIME in the last 168 hours. Cardiac Enzymes: No results for input(s): CKTOTAL, CKMB, CKMBINDEX, TROPONINI in the last 168 hours. BNP (last 3 results)  Recent Labs  11/26/14 1008  PROBNP 152.0*   HbA1C: No results for input(s): HGBA1C in the last 72 hours. CBG:  Recent Labs Lab 10/06/15 2248 10/07/15 0752 10/07/15 1218 10/07/15 1718  GLUCAP 187* 133* 179* 166*   Lipid Profile: No results for input(s): CHOL, HDL, LDLCALC, TRIG, CHOLHDL, LDLDIRECT in the last 72 hours. Thyroid Function Tests: No results for input(s): TSH, T4TOTAL, FREET4, T3FREE, THYROIDAB in the last 72 hours. Anemia Panel: No results for input(s): VITAMINB12, FOLATE, FERRITIN, TIBC, IRON, RETICCTPCT in the last 72 hours. Urine analysis:    Component Value Date/Time   COLORURINE YELLOW 10/07/2015 0105   APPEARANCEUR CLEAR 10/07/2015 0105   LABSPEC 1.037* 10/07/2015 0105   PHURINE 5.5 10/07/2015 0105   GLUCOSEU NEGATIVE 10/07/2015 0105   HGBUR NEGATIVE 10/07/2015 0105   BILIRUBINUR NEGATIVE 10/07/2015 0105   KETONESUR NEGATIVE 10/07/2015 0105   PROTEINUR 100* 10/07/2015 0105   NITRITE NEGATIVE 10/07/2015 0105   LEUKOCYTESUR NEGATIVE 10/07/2015 0105   Sepsis Labs: @LABRCNTIP (procalcitonin:4,lacticidven:4)  ) Recent Results (from the past 240 hour(s))  Culture, blood (Routine X 2) w Reflex to ID Panel     Status: None (Preliminary result)   Collection Time: 10/06/15  6:20 PM  Result Value Ref Range Status   Specimen Description BLOOD RIGHT ANTECUBITAL  Final   Special Requests BOTTLES DRAWN AEROBIC AND ANAEROBIC 5CC  Final   Culture NO GROWTH < 24 HOURS  Final   Report Status PENDING  Incomplete  Culture, blood (Routine X 2) w Reflex to ID Panel     Status: None (Preliminary result)   Collection Time: 10/06/15  7:20 PM  Result Value Ref Range Status   Specimen Description BLOOD LEFT ANTECUBITAL  Final     Special Requests BOTTLES DRAWN AEROBIC AND ANAEROBIC 5CC  Final   Culture NO GROWTH < 24 HOURS  Final   Report Status PENDING  Incomplete      Radiology Studies: Dg Chest 2 View  10/06/2015  CLINICAL DATA:  Two week history of shortness of breath. EXAM: CHEST  2 VIEW COMPARISON:  CT scan 11/30/2014.  Chest x-ray 11/26/2014. FINDINGS: Two views study shows hyperexpansion. Diffuse interstitial and alveolar opacity in the right upper lung is new in the interval. There is some nodularity in fullness in the right hilum which was not present on the previous chest x-ray. Interstitial markings are diffusely coarsened with chronic features. The cardiopericardial silhouette is within normal limits for size. Patient is status post CABG IMPRESSION: Interval development of interstitial and alveolar opacity in the right upper lung with some fullness in the right hilum.  Imaging features raise concern for central lesion with postobstructive process. CT chest with contrast recommended to further evaluate. Electronically Signed   By: Kennith CenterEric  Mansell M.D.   On: 10/06/2015 18:48   Ct Angio Chest Pe W Or Wo Contrast  10/06/2015  CLINICAL DATA:  82 year old male with shortness of breath and tachycardia EXAM: CT ANGIOGRAPHY CHEST WITH CONTRAST TECHNIQUE: Multidetector CT imaging of the chest was performed using the standard protocol during bolus administration of intravenous contrast. Multiplanar CT image reconstructions and MIPs were obtained to evaluate the vascular anatomy. CONTRAST:  70 cc Isovue 370 COMPARISON:  Chest radiograph dated 10/06/2015 and CT dated 11/30/2014 FINDINGS: There is emphysematous changes of the lungs. There is a large area of interstitial prominence and airspace density involving the right upper lobe, right middle lobe, and right lower lobe most compatible with pneumonia. A 2.0 x 3.5 cm more confluent density in the right upper lobe (series 5, image 43) also most likely an area of  consolidation/infiltrate. Underlying mass is less likely but not excluded. Clinical correlation is recommended. There is a small right pleural effusion. A 1.9 x 4.8 cm right lower lobe subpleural bleb noted. The left lung is clear. There is no pneumothorax. The central airways are patent. There is atherosclerotic calcification of the thoracic aorta. There is no aneurysmal dilatation or evidence of dissection. There is a vascular stent at the origin of the left subclavian artery. Evaluation of the great vessels of the aortic arch and left subclavian artery stent is limited due to timing of the contrast. Evaluation of the pulmonary arteries is limited due to respiratory motion artifact and suboptimal visualization of the peripheral branches. No central pulmonary artery embolus identified. There is no cardiomegaly or pericardial effusion. There is coronary vascular calcification. CABG vascular clips noted. Set right hilar adenopathy. There is no mediastinal adenopathy. The esophagus is grossly unremarkable. No thyroid nodules identified. There is no axillary adenopathy. The chest wall soft tissues appear unremarkable. There is osteopenia with degenerative changes of the spine. No acute fracture. The visualized upper abdomen appear unremarkable. Review of the MIP images confirms the above findings. IMPRESSION: CT evidence of pulmonary embolism. Large area of interstitial and airspace opacity involving the right lung most compatible with pneumonia. Clinical correlation and follow-up resolution recommended. Small right pleural effusion. Electronically Signed   By: Elgie CollardArash  Radparvar M.D.   On: 10/06/2015 21:04     Scheduled Meds: . acetaminophen  650 mg Oral QHS  . arformoterol  15 mcg Nebulization BID  . aspirin EC  81 mg Oral BH-q7a  . atorvastatin  40 mg Oral Daily  . azithromycin  500 mg Oral Q24H  . budesonide (PULMICORT) nebulizer solution  0.5 mg Nebulization BID  . cefTRIAXone (ROCEPHIN)  IV  1 g  Intravenous Q24H  . cholecalciferol  1,000 Units Oral Daily  . doxazosin  4 mg Oral QHS  . enoxaparin (LOVENOX) injection  40 mg Subcutaneous Q24H  . insulin aspart  0-15 Units Subcutaneous TID WC  . ipratropium-albuterol  3 mL Nebulization QID  . isosorbide mononitrate  30 mg Oral Daily  . metoprolol succinate  25 mg Oral Daily  . predniSONE  50 mg Oral Q breakfast  . umeclidinium bromide  1 puff Inhalation Daily   Continuous Infusions:    LOS: 1 day    Time spent: 30 min    Renae FickleSHORT, Mikaele Stecher, MD [facility] Pager [phone]  If 7PM-7AM, please contact night-coverage [email or web address] Password [email or web address] 10/07/2015, 5:47 PM

## 2015-10-07 NOTE — Consult Note (Signed)
Name: Timothy Cordova MRN: [id number] DOB: 09-30-1928    ADMISSION DATE:  10/06/2015 CONSULTATION DATE:  10/07/2015  REFERRING MD :  Dr. Malachi Bonds  CHIEF COMPLAINT:  Shortness of Breath   BRIEF PATIENT DESCRIPTION:  This is an 82 yo male who presented to [facility] ER on 6/28 with c/o shortness of breath was diagnosed with RUL pneumonia.  SIGNIFICANT EVENTS  -Admitted 6/28 with pneumonia  STUDIES:  CT Angio Chest 6/28>>CT evidence of pulmonary embolism. Large area of interstitial and airspace opacity involving the right lung most compatible with pneumonia. Small right pleural effusion. A 2.0 x 3.5 cm more confluent density in the right upper lobe (series 5, image 43) also most likely an area of consolidation/infiltrate. Underlying mass is less likely but not excluded.   HISTORY OF PRESENT ILLNESS:   This is an 82 yo male with PMH of CAD, HTN, COPD, Chronic diastolic heart failure (EF 55% in 2008), OSA, DM, previous tobacco abuse (180 pack-year history), BPH, CKD, Hypercholesterolemia, and Dementia.  He presented to [facility] ER on 6/28 with c/o worsening shortness of breath over the past 2 weeks with fever and dry cough.  4 weeks ago he was treated as an outpatient for a COPD exacerbation, and was prescribed steroids and Levaquin.  He completed the course of treatment, however states he never got completely well.  He has had poor appetite since symptoms began 4 weeks ago and over the last 2 weeks he has had chills. He does not require home O2.  PCCM consulted due to an incidental finding on CT angio chest performed 10/06/15 concerning a RUL mass and for acute on chronic respiratory failure secondary to RUL pneumonia.   PAST MEDICAL HISTORY :   has a past medical history of Coronary artery disease; Hypertension; COPD (chronic obstructive pulmonary disease) (HCC); Chronic diastolic heart failure (HCC); Edema; Mixed hyperlipidemia; Sleep apnea; Diabetes mellitus; History of tobacco abuse;  Allergic rhinitis; Hypercholesterolemia; Benign prostatic hypertrophy; Osteoarthritis; Dementia; Hearing loss; and CKD (chronic kidney disease).  has past surgical history that includes Angioplasty (7829,5621); Rotator cuff repair (2005); Coronary artery bypass graft (02/2005); Cardiac catheterization (90s); Cardiac catheterization; and Cardiac catheterization (04/29/2013). Prior to Admission medications   Medication Sig Start Date End Date Taking? Authorizing Provider  acetaminophen (TYLENOL) 325 MG tablet Take 650 mg by mouth at bedtime.   Yes Historical Provider, MD  albuterol (PROVENTIL HFA;VENTOLIN HFA) 108 (90 Base) MCG/ACT inhaler Inhale 2 puffs into the lungs every 6 (six) hours as needed for wheezing. 08/31/15  Yes Joaquim Nam, MD  aspirin 81 MG EC tablet Take 81 mg by mouth every morning.    Yes Historical Provider, MD  atorvastatin (LIPITOR) 80 MG tablet Take 40 mg by mouth daily.   Yes Historical Provider, MD  Cholecalciferol (VITAMIN D-3 PO) Take 1,000 Units by mouth daily.    Yes Historical Provider, MD  doxazosin (CARDURA) 4 MG tablet TAKE 1 TO 2 TABLETS BY MOUTH EVERY DAY AS DIRECTED. Patient taking differently: TAKE 1 TABLETS BY MOUTH EVERY evening 01/07/15  Yes Spencer Copland, MD  furosemide (LASIX) 20 MG tablet Take 20 mg by mouth daily as needed for fluid.    Yes Historical Provider, MD  GLIPIZIDE XL 10 MG 24 hr tablet TAKE TWO (2) TABLETS BY MOUTH DAILY Patient taking differently: TAKE One (1) TABLET (10 mg) BY MOUTH twice a day 09/22/15  Yes Spencer Copland, MD  isosorbide mononitrate (IMDUR) 30 MG 24 hr tablet Take 1 tablet (30 mg  total) by mouth daily. 05/07/13  Yes Spencer Copland, MD  levofloxacin (LEVAQUIN) 500 MG tablet Take 1 tablet (500 mg total) by mouth daily. 08/31/15  Yes Joaquim Nam, MD  metoprolol succinate (TOPROL-XL) 50 MG 24 hr tablet Take 25 mg by mouth daily. Take with or immediately following a meal.   Yes Historical Provider, MD  Multiple  Vitamins-Minerals (PRESERVISION AREDS PO) Take 1 tablet by mouth daily.    Yes Historical Provider, MD  ONE TOUCH ULTRA TEST test strip USE TO CHECK BLOOD SUGAR 2 TIMES DAILY AS DIRECTED 09/08/15  Yes Hannah Beat, MD  Letta Pate DELICA LANCETS 33G MISC  02/13/13  Yes Historical Provider, MD  Tiotropium Bromide-Olodaterol (STIOLTO RESPIMAT) 2.5-2.5 MCG/ACT AERS Inhale 2 puffs into the lungs every morning.    Yes Historical Provider, MD   Allergies  Allergen Reactions  . Metformin And Related   . Tramadol Hcl     Tremor vs seizure activity  . Ace Inhibitors     REACTION: cough  . Atorvastatin     REACTION: weakness  . Codeine Nausea And Vomiting  . Morphine Sulfate     REACTION: unspecified  . Ticlopidine Hcl     REACTION: unspecified  . Warfarin Sodium Other (See Comments)    Nausea and vomiting     FAMILY HISTORY:  family history includes Heart attack in his mother; Lung cancer in his sister. SOCIAL HISTORY:  reports that he quit smoking about 10 years ago. His smoking use included Cigarettes. He has a 180 pack-year smoking history. He has never used smokeless tobacco. He reports that he does not drink alcohol or use illicit drugs.  REVIEW OF SYSTEMS:  Positives in BOLD Constitutional: Negative for fever, chills, weight loss, malaise/fatigue and diaphoresis.  HENT: Negative for hearing loss, ear pain, nosebleeds, congestion, sore throat, neck pain, tinnitus and ear discharge.   Eyes: Negative for blurred vision, double vision, photophobia, pain, discharge and redness.  Respiratory: Negative for cough, hemoptysis, sputum production, shortness of breath, wheezing and stridor.   Cardiovascular: Negative for chest pain, palpitations, orthopnea, claudication, leg swelling and PND.  Gastrointestinal: Negative for heartburn, nausea, vomiting, abdominal pain, diarrhea, constipation, blood in stool and melena.  Genitourinary: Negative for dysuria, urgency, frequency, hematuria and flank  pain.  Musculoskeletal: Negative for myalgias, back pain, joint pain and falls.  Skin: Negative for itching and rash.  Neurological: Negative for dizziness, tingling, tremors, sensory change, speech change, focal weakness, seizures, loss of consciousness, weakness and headaches.  Endo/Heme/Allergies: Negative for environmental allergies and polydipsia. Does not bruise/bleed easily.  SUBJECTIVE:  Currently no c/o acute distress states he feels better and back to baseline since this admission to hospital.  Currently on 2L O2 via nasal canula.  VITAL SIGNS: Temp:  [97.6 F (36.4 C)-101.9 F (38.8 C)] 97.6 F (36.4 C) (06/29 0559) Pulse Rate:  [77-102] 86 (06/29 0849) Resp:  [18-38] 18 (06/29 0849) BP: (127-158)/(47-87) 154/65 mmHg (06/29 0849) SpO2:  [91 %-98 %] 95 % (06/29 1202) Weight:  [176 lb (79.833 kg)-180 lb 8.9 oz (81.9 kg)] 180 lb 8.9 oz (81.9 kg) (06/28 2330)  PHYSICAL EXAMINATION: General:  Chronically ill appearing male Neuro:  Alert and oriented, follows commands HEENT:  Supple, no JVD Cardiovascular:  s1s2, rrr, no M/R/G Lungs: faint crackles right lobes, diminished throughout all other lobes, even Abdomen: +BS x4, soft, non tender, non distended Musculoskeletal:  Normal bulk and tone  Skin:  intact   Recent Labs Lab 10/06/15 1605  NA 137  K 5.3*  CL 102  CO2 28  BUN 24*  CREATININE 1.35*  GLUCOSE 149*    Recent Labs Lab 10/06/15 1605  HGB 11.5*  HCT 36.7*  WBC 8.6  PLT 407*   Dg Chest 2 View  10/06/2015  CLINICAL DATA:  Two week history of shortness of breath. EXAM: CHEST  2 VIEW COMPARISON:  CT scan 11/30/2014.  Chest x-ray 11/26/2014. FINDINGS: Two views study shows hyperexpansion. Diffuse interstitial and alveolar opacity in the right upper lung is new in the interval. There is some nodularity in fullness in the right hilum which was not present on the previous chest x-ray. Interstitial markings are diffusely coarsened with chronic features. The  cardiopericardial silhouette is within normal limits for size. Patient is status post CABG IMPRESSION: Interval development of interstitial and alveolar opacity in the right upper lung with some fullness in the right hilum. Imaging features raise concern for central lesion with postobstructive process. CT chest with contrast recommended to further evaluate. Electronically Signed   By: Kennith CenterEric  Mansell M.D.   On: 10/06/2015 18:48   Ct Angio Chest Pe W Or Wo Contrast  10/06/2015  CLINICAL DATA:  82 year old male with shortness of breath and tachycardia EXAM: CT ANGIOGRAPHY CHEST WITH CONTRAST TECHNIQUE: Multidetector CT imaging of the chest was performed using the standard protocol during bolus administration of intravenous contrast. Multiplanar CT image reconstructions and MIPs were obtained to evaluate the vascular anatomy. CONTRAST:  70 cc Isovue 370 COMPARISON:  Chest radiograph dated 10/06/2015 and CT dated 11/30/2014 FINDINGS: There is emphysematous changes of the lungs. There is a large area of interstitial prominence and airspace density involving the right upper lobe, right middle lobe, and right lower lobe most compatible with pneumonia. A 2.0 x 3.5 cm more confluent density in the right upper lobe (series 5, image 43) also most likely an area of consolidation/infiltrate. Underlying mass is less likely but not excluded. Clinical correlation is recommended. There is a small right pleural effusion. A 1.9 x 4.8 cm right lower lobe subpleural bleb noted. The left lung is clear. There is no pneumothorax. The central airways are patent. There is atherosclerotic calcification of the thoracic aorta. There is no aneurysmal dilatation or evidence of dissection. There is a vascular stent at the origin of the left subclavian artery. Evaluation of the great vessels of the aortic arch and left subclavian artery stent is limited due to timing of the contrast. Evaluation of the pulmonary arteries is limited due to  respiratory motion artifact and suboptimal visualization of the peripheral branches. No central pulmonary artery embolus identified. There is no cardiomegaly or pericardial effusion. There is coronary vascular calcification. CABG vascular clips noted. Set right hilar adenopathy. There is no mediastinal adenopathy. The esophagus is grossly unremarkable. No thyroid nodules identified. There is no axillary adenopathy. The chest wall soft tissues appear unremarkable. There is osteopenia with degenerative changes of the spine. No acute fracture. The visualized upper abdomen appear unremarkable. Review of the MIP images confirms the above findings. IMPRESSION: CT evidence of pulmonary embolism. Large area of interstitial and airspace opacity involving the right lung most compatible with pneumonia. Clinical correlation and follow-up resolution recommended. Small right pleural effusion. Electronically Signed   By: Elgie CollardArash  Radparvar M.D.   On: 10/06/2015 21:04   STUDIES:  CT Angio Chest 6/28>>CT evidence of pulmonary embolism. Large area of interstitial and airspace opacity involving the right lung most compatible with pneumonia. Small right pleural effusion. A 2.0 x 3.5 cm more confluent  density in the right upper lobe (series 5, image 43) also most likely an area of consolidation/infiltrate. Underlying mass is less likely but not excluded.  CULTURES: Blood x2 6/28>>negative 6/29>> Urine 6/29>> Urine Legionella 6/29>>  ANTIBIOTICS: Azithromycin 6/28>> Rocephin 6/28>>  ASSESSMENT / PLAN: Acute on chronic respiratory failure secondary to CAP Questionable underlying mass in RUL per CT angio performed 10/06/2015  Plan: Continue iv abx as listed above during hospital course. Will likely transition to po Augmentin when discharged home Supplemental O2 to maintain O2 sats 88% to 94% Follow cultures, obtain urine legionella  Check PO2 tid Continue Brovana, Pulmicort, Incruse Ellipta, and prn Proventil Continue  Prednisone  Repeat CXR in am Unlikely RUL mass likely RUL pneumonia presentation will repeat CT angio in 3 months as outpatient once pneumonia has resolved  Follow-up in office with Dr. Hazle QuantMcQuid in 2 weeks   Sonda Rumbleana Tiera Mensinger, [facility] LcGNP  Pulmonary/Critical Care

## 2015-10-07 NOTE — Care Management Note (Signed)
Case Management Note  Patient Details  Name: Ermalene Postinhomas O Santellan MRN: [id number] Date of Birth: April 07, 1929  Subjective/Objective:                    Action/Plan: Consult : patient is primary care giver for wife at home.  Spoke with patient and family at home . Patient's [facility] and other family members are assisting.  Will follow patient for his discharge needs .  Expected Discharge Date:                  Expected Discharge Plan:  Home/Self Care  In-House Referral:     Discharge planning Services     Post Acute Care Choice:    Choice offered to:  Patient  DME Arranged:    DME Agency:     HH Arranged:    HH Agency:     Status of Service:  In process, will continue to follow  If discussed at Long Length of Stay Meetings, dates discussed:    Additional Comments:  Kingsley PlanWile, Reniyah Gootee Marie, RN 10/07/2015, 2:54 PM

## 2015-10-08 DIAGNOSIS — J432 Centrilobular emphysema: Secondary | ICD-10-CM | POA: Diagnosis not present

## 2015-10-08 DIAGNOSIS — J189 Pneumonia, unspecified organism: Secondary | ICD-10-CM | POA: Diagnosis not present

## 2015-10-08 DIAGNOSIS — I10 Essential (primary) hypertension: Secondary | ICD-10-CM | POA: Diagnosis not present

## 2015-10-08 DIAGNOSIS — I5033 Acute on chronic diastolic (congestive) heart failure: Secondary | ICD-10-CM

## 2015-10-08 DIAGNOSIS — E1122 Type 2 diabetes mellitus with diabetic chronic kidney disease: Secondary | ICD-10-CM | POA: Diagnosis not present

## 2015-10-08 LAB — CBC
HCT: 34.7 % — ABNORMAL LOW (ref 39.0–52.0)
Hemoglobin: 10.6 g/dL — ABNORMAL LOW (ref 13.0–17.0)
MCH: 29.2 pg (ref 26.0–34.0)
MCHC: 30.5 g/dL (ref 30.0–36.0)
MCV: 95.6 fL (ref 78.0–100.0)
PLATELETS: 341 10*3/uL (ref 150–400)
RBC: 3.63 MIL/uL — AB (ref 4.22–5.81)
RDW: 13.6 % (ref 11.5–15.5)
WBC: 8 10*3/uL (ref 4.0–10.5)

## 2015-10-08 LAB — BASIC METABOLIC PANEL
Anion gap: 8 (ref 5–15)
BUN: 20 mg/dL (ref 6–20)
CALCIUM: 9 mg/dL (ref 8.9–10.3)
CHLORIDE: 104 mmol/L (ref 101–111)
CO2: 24 mmol/L (ref 22–32)
CREATININE: 1.22 mg/dL (ref 0.61–1.24)
GFR calc non Af Amer: 52 mL/min — ABNORMAL LOW (ref >60)
Glucose, Bld: 120 mg/dL — ABNORMAL HIGH (ref 65–99)
Potassium: 4.5 mmol/L (ref 3.5–5.1)
SODIUM: 136 mmol/L (ref 135–145)

## 2015-10-08 LAB — GLUCOSE, CAPILLARY
GLUCOSE-CAPILLARY: 115 mg/dL — AB (ref 65–99)
GLUCOSE-CAPILLARY: 342 mg/dL — AB (ref 65–99)
GLUCOSE-CAPILLARY: 378 mg/dL — AB (ref 65–99)
Glucose-Capillary: 143 mg/dL — ABNORMAL HIGH (ref 65–99)
Glucose-Capillary: 267 mg/dL — ABNORMAL HIGH (ref 65–99)

## 2015-10-08 LAB — URINE CULTURE

## 2015-10-08 MED ORDER — FUROSEMIDE 40 MG PO TABS
40.0000 mg | ORAL_TABLET | Freq: Every day | ORAL | Status: DC
  Administered 2015-10-08 – 2015-10-09 (×2): 40 mg via ORAL
  Filled 2015-10-08 (×2): qty 1

## 2015-10-08 MED ORDER — INSULIN ASPART 100 UNIT/ML ~~LOC~~ SOLN
3.0000 [IU] | Freq: Three times a day (TID) | SUBCUTANEOUS | Status: DC
  Administered 2015-10-08 (×3): 3 [IU] via SUBCUTANEOUS

## 2015-10-08 NOTE — Progress Notes (Signed)
10/08/15 SATURATION QUALIFICATIONS: (This note is used to comply with regulatory documentation for home oxygen)  Patient Saturations on Room Air at Rest = 91%  Patient Saturations on Room Air while Ambulating = 86%  Patient Saturations on 2 Liters of oxygen while Ambulating = 88%   Please briefly explain why patient needs home oxygen:oxygen saturation drops on RA and 2 liters.  Blanchard KelchKaren Mackenzy Eisenberg PT [phone]

## 2015-10-08 NOTE — Evaluation (Signed)
Physical Therapy Evaluation Patient Details Name: Timothy Postinhomas O Corporan MRN: [id number] DOB: 07-Mar-1929 Today's Date: 10/08/2015   History of Present Illness  Timothy Postinhomas O Ghan is a 82 y.o. male with medical history significant of COPD, patient presents to the ED with c/o SOB worsening over the past 2 weeks. In ED found to have a temp and ound to have R lung PNA on CTA chest.  Clinical Impression  The patient is very pleasant. Oxygen saturation dropped on RA. See separate  Note for  Saturation qualifications. The  Patient well benefit from HHPT. Wife currently has [facility]. Pt admitted with above diagnosis. Pt currently with functional limitations due to the deficits listed below (see PT Problem List). Pt will benefit from skilled PT to increase their independence and safety with mobility to allow discharge to the venue listed below.       Follow Up Recommendations Home health PT;Supervision/Assistance - 24 hour    Equipment Recommendations  None recommended by PT    Recommendations for Other Services       Precautions / Restrictions Precautions Precautions: Fall Precaution Comments: monitor sats      Mobility  Bed Mobility Overal bed mobility: Modified Independent                Transfers Overall transfer level: Needs assistance Equipment used: Rolling walker (2 wheeled) Transfers: Sit to/from Stand Sit to Stand: Supervision            Ambulation/Gait Ambulation/Gait assistance: Min guard Ambulation Distance (Feet): 45 Feet Assistive device: Rolling walker (2 wheeled) Gait Pattern/deviations: Step-through pattern     General Gait Details: $%' on RA, sats dropped to 86, HR 115. Rested and ambualted x 120' on 2 liters. Sats  dropped to 88%, HR 120. RN notified. Gait is steady with the RW, turns  around with balance loss.   Stairs            Wheelchair Mobility    Modified Rankin (Stroke Patients Only)       Balance Overall balance assessment:  Needs assistance Sitting-balance support: Feet supported;No upper extremity supported Sitting balance-Leahy Scale: Good     Standing balance support: No upper extremity supported Standing balance-Leahy Scale: Fair                               Pertinent Vitals/Pain Pain Assessment: No/denies pain    Home Living Family/patient expects to be discharged to:: Private residence Living Arrangements: Spouse/significant other Available Help at Discharge: Family Type of Home: House Home Access: Ramped entrance     Home Layout: Two level;Able to live on main level with bedroom/bathroom Home Equipment: Dan HumphreysWalker - 2 wheels;Walker - 4 wheels;Cane - single point;Bedside commode      Prior Function Level of Independence: Independent               Hand Dominance        Extremity/Trunk Assessment   Upper Extremity Assessment: Generalized weakness           Lower Extremity Assessment: Generalized weakness      Cervical / Trunk Assessment: Kyphotic  Communication   Communication: No difficulties  Cognition Arousal/Alertness: Awake/alert Behavior During Therapy: WFL for tasks assessed/performed Overall Cognitive Status: Within Functional Limits for tasks assessed                      General Comments      Exercises  Assessment/Plan    PT Assessment Patient needs continued PT services  PT Diagnosis Difficulty walking;Generalized weakness   PT Problem List Decreased strength;Decreased activity tolerance;Decreased mobility;Cardiopulmonary status limiting activity;Decreased knowledge of precautions;Decreased safety awareness;Decreased knowledge of use of DME  PT Treatment Interventions DME instruction;Gait training;Functional mobility training;Therapeutic exercise;Therapeutic activities;Patient/family education   PT Goals (Current goals can be found in the Care Plan section) Acute Rehab PT Goals Patient Stated Goal: to  walk without  oxygen PT Goal Formulation: With patient/family Time For Goal Achievement: 10/22/15 Potential to Achieve Goals: Good    Frequency Min 3X/week   Barriers to discharge        Co-evaluation               End of Session Equipment Utilized During Treatment: Gait belt Activity Tolerance: Patient tolerated treatment well Patient left: in bed;with call bell/phone within reach;with bed alarm set Nurse Communication: Mobility status    Functional Assessment Tool Used: clinical judgement Functional Limitation: Mobility: Walking and moving around Mobility: Walking and Moving Around Current Status ([id number](G8978): At least 20 percent but less than 40 percent impaired, limited or restricted Mobility: Walking and Moving Around Goal Status [phone](G8979): 0 percent impaired, limited or restricted    Time: 1420-1500 PT Time Calculation (min) (ACUTE ONLY): 40 min   Charges:   PT Evaluation $PT Eval Moderate Complexity: 1 Procedure PT Treatments $Gait Training: 23-37 mins   PT G Codes:   PT G-Codes **NOT FOR INPATIENT CLASS** Functional Assessment Tool Used: clinical judgement Functional Limitation: Mobility: Walking and moving around Mobility: Walking and Moving Around Current Status ([id number](G8978): At least 20 percent but less than 40 percent impaired, limited or restricted Mobility: Walking and Moving Around Goal Status [phone](G8979): 0 percent impaired, limited or restricted    Rada HayHill, Harpreet Pompey Elizabeth 10/08/2015, 3:21 PM Blanchard KelchKaren Goddess Gebbia PT [phone]

## 2015-10-08 NOTE — Progress Notes (Signed)
PROGRESS NOTE  Timothy Cordova  ZOX:[id number] DOB: 1928-07-08 DOA: 10/06/2015 PCP: Hannah BeatSpencer Copland, MD  Brief Narrative:   Timothy Cordova is a 82 y.o. male with medical history significant of COPD who presented to the ER withprogressively worsening SOB worsening over the past 2 weeks. Fever, cough at home. 4 weeks ago had COPD exacerbation treated with steroids and levaquin course but did not feel better and continued to feel worse. Associated poor appetite and a 20-lb weight loss in the last month.  In the ER, fever of 101.9 and CT of the chest demonstrated a right lung pneumonia.    Assessment & Plan:   Principal Problem:   CAP (community acquired pneumonia) Active Problems:   DM (diabetes mellitus), type 2 with renal complications (HCC)   Essential hypertension   Centrilobular emphysema (HCC)   Pneumonia involving right lung   Chronic obstructive pulmonary disease (HCC)  Acute hypoxic respiratory failure secondary to community-acquired pneumonia, worrisome that symptoms have been present for a month -  Continue ceftriaxone and azithromycin day 3 -  Blood cultures no growth to date -  Pulmonary consultation to reevaluate CT and determine if any further work up for malignancy should occur during this hospitalization >> repeat CXR in 2 weeks and CT chest once CXR has cleared  Acute on chronic diastolic heart failure, lower extremity edema on exam may suggest some volume overload worsening his dyspnea -  Lasix 40mg  po daily -  Daily weights and strict I/O -  Okay to place condom catheter if necessary  COPD with wheezing -  Continue prednisone 50 mg daily -  Continue budesonide, brovana and DuoNebs  HTN, blood pressures elevated  -  Continue doxazosin, imdur, and metoprolol  DM2, with hyperglycemia due to steroids -  Add standing aspart with meals  -  Continue moderate dose SSI   CKD stage 3 - chronic and baseline  20 pound weight loss over last month.  This could  suggest underlying malignancy -  Nutrition consultation -  Regular diet -  Add supplements   DVT prophylaxis: Lovenox Code Status: Full Family Communication: patient alone Disposition:  PT evaluation.     Consultants:  Pulmonology  Procedures:  CT chest  Antimicrobials:   Ceftriaxone and azithromycin from 6/28    Subjective: Feels about the same as yesterday.  Still wheezy and Timothy Cordova of breath and coughing frequently.    Objective: Filed Vitals:   10/08/15 0634 10/08/15 0859 10/08/15 1150 10/08/15 1331  BP:    148/54  Pulse: 88   97  Temp:    98.5 F (36.9 C)  TempSrc:    Oral  Resp: 27   16  Height:      Weight:      SpO2: 98% 93% 94% 97%    Intake/Output Summary (Last 24 hours) at 10/08/15 1348 Last data filed at 10/08/15 0925  Gross per 24 hour  Intake    410 ml  Output    500 ml  Net    -90 ml   Filed Weights   10/06/15 1600 10/06/15 2330  Weight: 79.833 kg (176 lb) 81.9 kg (180 lb 8.9 oz)    Examination:  General exam:  Adult Male. mild SCM retractions and needed to take some breaks while talking, nasal cannula in place  HEENT:  NCAT, MMM Respiratory system: Diminished bilateral breath sounds with high-pitched expiratory wheeze and prolonged expiratory phase  Cardiovascular system: Regular rate and rhythm, normal S1/S2. No murmurs, rubs, gallops  or clicks.  Warm extremities Gastrointestinal system: Normal active bowel sounds, soft, nondistended, nontender. MSK:  Normal tone and bulk, no lower extremity edema Neuro:  Grossly intact    Data Reviewed: I have personally reviewed following labs and imaging studies  CBC:  Recent Labs Lab 10/06/15 1605 10/08/15 0552  WBC 8.6 8.0  HGB 11.5* 10.6*  HCT 36.7* 34.7*  MCV 95.6 95.6  PLT 407* 341   Basic Metabolic Panel:  Recent Labs Lab 10/06/15 1605 10/08/15 0552  NA 137 136  K 5.3* 4.5  CL 102 104  CO2 28 24  GLUCOSE 149* 120*  BUN 24* 20  CREATININE 1.35* 1.22  CALCIUM 9.4 9.0    GFR: Estimated Creatinine Clearance: 42.8 mL/min (by C-G formula based on Cr of 1.22). Liver Function Tests: No results for input(s): AST, ALT, ALKPHOS, BILITOT, PROT, ALBUMIN in the last 168 hours. No results for input(s): LIPASE, AMYLASE in the last 168 hours. No results for input(s): AMMONIA in the last 168 hours. Coagulation Profile: No results for input(s): INR, PROTIME in the last 168 hours. Cardiac Enzymes: No results for input(s): CKTOTAL, CKMB, CKMBINDEX, TROPONINI in the last 168 hours. BNP (last 3 results)  Recent Labs  11/26/14 1008  PROBNP 152.0*   HbA1C: No results for input(s): HGBA1C in the last 72 hours. CBG:  Recent Labs Lab 10/07/15 1218 10/07/15 1718 10/07/15 2202 10/08/15 0757 10/08/15 1145  GLUCAP 179* 166* 143* 115* 267*   Lipid Profile: No results for input(s): CHOL, HDL, LDLCALC, TRIG, CHOLHDL, LDLDIRECT in the last 72 hours. Thyroid Function Tests: No results for input(s): TSH, T4TOTAL, FREET4, T3FREE, THYROIDAB in the last 72 hours. Anemia Panel: No results for input(s): VITAMINB12, FOLATE, FERRITIN, TIBC, IRON, RETICCTPCT in the last 72 hours. Urine analysis:    Component Value Date/Time   COLORURINE YELLOW 10/07/2015 0105   APPEARANCEUR CLEAR 10/07/2015 0105   LABSPEC 1.037* 10/07/2015 0105   PHURINE 5.5 10/07/2015 0105   GLUCOSEU NEGATIVE 10/07/2015 0105   HGBUR NEGATIVE 10/07/2015 0105   BILIRUBINUR NEGATIVE 10/07/2015 0105   KETONESUR NEGATIVE 10/07/2015 0105   PROTEINUR 100* 10/07/2015 0105   NITRITE NEGATIVE 10/07/2015 0105   LEUKOCYTESUR NEGATIVE 10/07/2015 0105   Sepsis Labs: @LABRCNTIP (procalcitonin:4,lacticidven:4)  ) Recent Results (from the past 240 hour(s))  Culture, blood (Routine X 2) w Reflex to ID Panel     Status: None (Preliminary result)   Collection Time: 10/06/15  6:20 PM  Result Value Ref Range Status   Specimen Description BLOOD RIGHT ANTECUBITAL  Final   Special Requests BOTTLES DRAWN AEROBIC AND  ANAEROBIC 5CC  Final   Culture NO GROWTH < 24 HOURS  Final   Report Status PENDING  Incomplete  Culture, blood (Routine X 2) w Reflex to ID Panel     Status: None (Preliminary result)   Collection Time: 10/06/15  7:20 PM  Result Value Ref Range Status   Specimen Description BLOOD LEFT ANTECUBITAL  Final   Special Requests BOTTLES DRAWN AEROBIC AND ANAEROBIC 5CC  Final   Culture NO GROWTH < 24 HOURS  Final   Report Status PENDING  Incomplete  Urine culture     Status: Abnormal   Collection Time: 10/07/15  1:05 AM  Result Value Ref Range Status   Specimen Description URINE, RANDOM  Final   Special Requests NONE  Final   Culture MULTIPLE SPECIES PRESENT, SUGGEST RECOLLECTION (A)  Final   Report Status 10/08/2015 FINAL  Final      Radiology Studies: Dg Chest 2  View  10/06/2015  CLINICAL DATA:  Two week history of shortness of breath. EXAM: CHEST  2 VIEW COMPARISON:  CT scan 11/30/2014.  Chest x-ray 11/26/2014. FINDINGS: Two views study shows hyperexpansion. Diffuse interstitial and alveolar opacity in the right upper lung is new in the interval. There is some nodularity in fullness in the right hilum which was not present on the previous chest x-ray. Interstitial markings are diffusely coarsened with chronic features. The cardiopericardial silhouette is within normal limits for size. Patient is status post CABG IMPRESSION: Interval development of interstitial and alveolar opacity in the right upper lung with some fullness in the right hilum. Imaging features raise concern for central lesion with postobstructive process. CT chest with contrast recommended to further evaluate. Electronically Signed   By: Kennith Center M.D.   On: 10/06/2015 18:48   Ct Angio Chest Pe W Or Wo Contrast  10/07/2015  ADDENDUM REPORT: 10/07/2015 18:33 ADDENDUM: Please note there is a typographical error in the impression of the original report. The correct impression should read: No CT evidence of pulmonary embolism.  Electronically Signed   By: Elgie Collard M.D.   On: 10/07/2015 18:33  10/07/2015  CLINICAL DATA:  82 year old male with shortness of breath and tachycardia EXAM: CT ANGIOGRAPHY CHEST WITH CONTRAST TECHNIQUE: Multidetector CT imaging of the chest was performed using the standard protocol during bolus administration of intravenous contrast. Multiplanar CT image reconstructions and MIPs were obtained to evaluate the vascular anatomy. CONTRAST:  70 cc Isovue 370 COMPARISON:  Chest radiograph dated 10/06/2015 and CT dated 11/30/2014 FINDINGS: There is emphysematous changes of the lungs. There is a large area of interstitial prominence and airspace density involving the right upper lobe, right middle lobe, and right lower lobe most compatible with pneumonia. A 2.0 x 3.5 cm more confluent density in the right upper lobe (series 5, image 43) also most likely an area of consolidation/infiltrate. Underlying mass is less likely but not excluded. Clinical correlation is recommended. There is a small right pleural effusion. A 1.9 x 4.8 cm right lower lobe subpleural bleb noted. The left lung is clear. There is no pneumothorax. The central airways are patent. There is atherosclerotic calcification of the thoracic aorta. There is no aneurysmal dilatation or evidence of dissection. There is a vascular stent at the origin of the left subclavian artery. Evaluation of the great vessels of the aortic arch and left subclavian artery stent is limited due to timing of the contrast. Evaluation of the pulmonary arteries is limited due to respiratory motion artifact and suboptimal visualization of the peripheral branches. No central pulmonary artery embolus identified. There is no cardiomegaly or pericardial effusion. There is coronary vascular calcification. CABG vascular clips noted. Set right hilar adenopathy. There is no mediastinal adenopathy. The esophagus is grossly unremarkable. No thyroid nodules identified. There is no  axillary adenopathy. The chest wall soft tissues appear unremarkable. There is osteopenia with degenerative changes of the spine. No acute fracture. The visualized upper abdomen appear unremarkable. Review of the MIP images confirms the above findings. IMPRESSION: CT evidence of pulmonary embolism. Large area of interstitial and airspace opacity involving the right lung most compatible with pneumonia. Clinical correlation and follow-up resolution recommended. Small right pleural effusion. Electronically Signed: By: Elgie Collard M.D. On: 10/06/2015 21:04     Scheduled Meds: . acetaminophen  650 mg Oral QHS  . arformoterol  15 mcg Nebulization BID  . aspirin EC  81 mg Oral BH-q7a  . atorvastatin  40 mg Oral  Daily  . azithromycin  500 mg Oral Q24H  . budesonide (PULMICORT) nebulizer solution  0.5 mg Nebulization BID  . cefTRIAXone (ROCEPHIN)  IV  1 g Intravenous Q24H  . cholecalciferol  1,000 Units Oral Daily  . doxazosin  4 mg Oral QHS  . enoxaparin (LOVENOX) injection  40 mg Subcutaneous Q24H  . furosemide  40 mg Oral Daily  . insulin aspart  0-15 Units Subcutaneous TID WC  . insulin aspart  3 Units Subcutaneous TID WC  . ipratropium-albuterol  3 mL Nebulization QID  . isosorbide mononitrate  30 mg Oral Daily  . metoprolol succinate  25 mg Oral Daily  . predniSONE  50 mg Oral Q breakfast  . umeclidinium bromide  1 puff Inhalation Daily   Continuous Infusions:    LOS: 2 days    Time spent: 30 min    Renae Fickle, MD [facility] Pager [phone]  If 7PM-7AM, please contact night-coverage [email or web address] Password [email or web address] 10/08/2015, 1:48 PM

## 2015-10-09 DIAGNOSIS — J189 Pneumonia, unspecified organism: Secondary | ICD-10-CM | POA: Diagnosis not present

## 2015-10-09 DIAGNOSIS — J432 Centrilobular emphysema: Secondary | ICD-10-CM | POA: Diagnosis not present

## 2015-10-09 DIAGNOSIS — I10 Essential (primary) hypertension: Secondary | ICD-10-CM | POA: Diagnosis not present

## 2015-10-09 DIAGNOSIS — I5033 Acute on chronic diastolic (congestive) heart failure: Secondary | ICD-10-CM | POA: Diagnosis not present

## 2015-10-09 DIAGNOSIS — E1122 Type 2 diabetes mellitus with diabetic chronic kidney disease: Secondary | ICD-10-CM | POA: Diagnosis not present

## 2015-10-09 DIAGNOSIS — I5030 Unspecified diastolic (congestive) heart failure: Secondary | ICD-10-CM | POA: Diagnosis not present

## 2015-10-09 LAB — GLUCOSE, CAPILLARY
Glucose-Capillary: 319 mg/dL — ABNORMAL HIGH (ref 65–99)
Glucose-Capillary: 186 mg/dL — ABNORMAL HIGH (ref 65–99)

## 2015-10-09 LAB — BASIC METABOLIC PANEL
Anion gap: 9 (ref 5–15)
BUN: 28 mg/dL — ABNORMAL HIGH (ref 6–20)
CALCIUM: 9.4 mg/dL (ref 8.9–10.3)
CO2: 26 mmol/L (ref 22–32)
CREATININE: 1.33 mg/dL — AB (ref 0.61–1.24)
Chloride: 102 mmol/L (ref 101–111)
GFR, EST AFRICAN AMERICAN: 55 mL/min — AB (ref >60)
GFR, EST NON AFRICAN AMERICAN: 47 mL/min — AB (ref >60)
Glucose, Bld: 348 mg/dL — ABNORMAL HIGH (ref 65–99)
Potassium: 5.2 mmol/L — ABNORMAL HIGH (ref 3.5–5.1)
SODIUM: 137 mmol/L (ref 135–145)

## 2015-10-09 MED ORDER — INSULIN ASPART 100 UNIT/ML ~~LOC~~ SOLN
5.0000 [IU] | Freq: Three times a day (TID) | SUBCUTANEOUS | Status: DC
  Administered 2015-10-09 (×2): 5 [IU] via SUBCUTANEOUS

## 2015-10-09 MED ORDER — INSULIN ASPART 100 UNIT/ML ~~LOC~~ SOLN
0.0000 [IU] | Freq: Three times a day (TID) | SUBCUTANEOUS | Status: DC
  Administered 2015-10-09: 15 [IU] via SUBCUTANEOUS
  Administered 2015-10-09: 4 [IU] via SUBCUTANEOUS

## 2015-10-09 MED ORDER — PREDNISONE 20 MG PO TABS: 40.0000 mg | ORAL_TABLET | Freq: Every day | ORAL | Status: DC

## 2015-10-09 MED ORDER — AMLODIPINE BESYLATE 5 MG PO TABS
5.0000 mg | ORAL_TABLET | Freq: Every day | ORAL | Status: DC
  Administered 2015-10-09: 5 mg via ORAL
  Filled 2015-10-09: qty 1

## 2015-10-09 MED ORDER — INSULIN ASPART 100 UNIT/ML ~~LOC~~ SOLN: 0.0000 [IU] | Freq: Every day | SUBCUTANEOUS | Status: DC

## 2015-10-09 MED ORDER — ALBUTEROL SULFATE HFA 108 (90 BASE) MCG/ACT IN AERS: 2.0000 | INHALATION_SPRAY | Freq: Four times a day (QID) | RESPIRATORY_TRACT | Status: DC | PRN

## 2015-10-09 MED ORDER — AMOXICILLIN-POT CLAVULANATE 875-125 MG PO TABS: 1.0000 | ORAL_TABLET | Freq: Two times a day (BID) | ORAL | Status: DC

## 2015-10-09 NOTE — Discharge Summary (Addendum)
Physician Discharge Summary  JESE COMELLA RKY:[id number] DOB: 1928/12/06 DOA: 10/06/2015  PCP: Owens Loffler, MD  Admit date: 10/06/2015 Discharge date: 10/09/2015  Admitted From: home  Disposition:  home  Recommendations for Outpatient Follow-up:  1. Follow up with Dr. Lake Bells in 2 weeks for repeat CXR.  PCCM suggests CT chest in 3 months to exclude malignancy. 2. Please obtain BMP/CBC in one week by PCP.  Please continue to evaluate for etiology of weight loss.  Home Health:  yes  Equipment/Devices:  oxygen  Discharge Condition:  Stable, improved CODE STATUS:  full  Diet recommendation:  diabetic   Brief/Interim Summary:  Timothy Cordova is a 82 y.o. male with medical history significant of COPD who presented to the ER withprogressively worsening SOB worsening over the past 2 weeks. Fever, cough at home. 4 weeks ago had COPD exacerbation treated with steroids and levaquin course but did not feel better and continued to feel worse. Associated poor appetite and a 20-lb weight loss in the last month. In the ER, fever of 101.9 and CT of the chest demonstrated a right lung pneumonia.   Discharge Diagnoses:  Principal Problem:   CAP (community acquired pneumonia) Active Problems:   DM (diabetes mellitus), type 2 with renal complications ([place])   Essential hypertension   Centrilobular emphysema (HCC)   Pneumonia involving right lung   Chronic obstructive pulmonary disease (HCC)   Acute on chronic diastolic heart failure (HCC)   Acute hypoxic respiratory failure secondary to community-acquired pneumonia, worrisome that symptoms have been present for a month.  He was started on ceftriaxone and azithromycin. Blood cultures were obtained but remain no growth to date. Because of his 20 pound weight loss over the last month, intermittent fevers and progressive fatigue, there was concern that he may have some underlying malignancy superimposed on his pneumonia. Pulmonology was  consulted to reevaluate his chest CT to determine if any further workup for malignancy should occur during his hospitalization. They recommended continuing treatment for pneumonia for a total of 10 days. They will repeat a chest x-ray in clinic in 2 weeks and will do some additional testing if his infiltrate does not clear. He was discharged with an additional 7 days of Augmentin. His respiratory distress improved, however he continued to have acute hypoxic respiratory failure and was discharged home on oxygen 2 L to use at rest and with exertion. Home health services were arranged.  Acute on chronic diastolic heart failure, lower extremity edema on exam may suggest some volume overload worsening his dyspnea.  He was given a dose of Lasix 40 mg by mouth once. He diuresed well clinically as his lower extremity edema resolved. Ins and outs were not strictly recorded.  Doubt that this was the cause of his respiratory distress.  COPD with wheezing. He was started on prednisone taper. He was given budesonide, Ro [place], duo nebs during his hospitalization, however he was advised to resume Stiolto and albuterol prn at home.  His wheezing continued but had dramatically improved. He was able to ambulate the halls with physical therapy and deemed that he could return home to resume ADLs despite ongoing wheeze.  Wife agreed that she felt he was improved enough to return home with her assistance and home health.    HTN, blood pressures elevated - Continued doxazosin, imdur, and metoprolol  DM2, with hyperglycemia due to steroids - Add standing aspart with meals to moderate dose SSI.  Recommended some additional treatment for diabetes and hyperglycemia while on  steroids, however, patient declined and said he had tolerated steroids before without the need for additional medication.  Will defer to patient's PCP.    CKD stage 3 - chronic and baseline  Reports 20 pound weight loss over last month.  Patient met with  nutrition to discuss starting supplements.  May be due to underlying infection.    Discharge Instructions  Discharge Instructions    Call MD for:  difficulty breathing, headache or visual disturbances    Complete by:  As directed      Call MD for:  extreme fatigue    Complete by:  As directed      Call MD for:  hives    Complete by:  As directed      Call MD for:  persistant dizziness or light-headedness    Complete by:  As directed      Call MD for:  persistant nausea and vomiting    Complete by:  As directed      Call MD for:  severe uncontrolled pain    Complete by:  As directed      Call MD for:  temperature >100.4    Complete by:  As directed      Diet - low sodium heart healthy    Complete by:  As directed      Diet Carb Modified    Complete by:  As directed      Increase activity slowly    Complete by:  As directed             Medication List    STOP taking these medications        levofloxacin 500 MG tablet  Commonly known as:  LEVAQUIN      TAKE these medications        acetaminophen 325 MG tablet  Commonly known as:  TYLENOL  Take 650 mg by mouth at bedtime.     albuterol 108 (90 Base) MCG/ACT inhaler  Commonly known as:  PROVENTIL HFA;VENTOLIN HFA  Inhale 2 puffs into the lungs every 6 (six) hours as needed for wheezing or shortness of breath.     amoxicillin-clavulanate 875-125 MG tablet  Commonly known as:  AUGMENTIN  Take 1 tablet by mouth 2 (two) times daily.     aspirin 81 MG EC tablet  Take 81 mg by mouth every morning.     atorvastatin 80 MG tablet  Commonly known as:  LIPITOR  Take 40 mg by mouth daily.     doxazosin 4 MG tablet  Commonly known as:  CARDURA  TAKE 1 TO 2 TABLETS BY MOUTH EVERY DAY AS DIRECTED.     furosemide 20 MG tablet  Commonly known as:  LASIX  Take 20 mg by mouth daily as needed for fluid.     GLIPIZIDE XL 10 MG 24 hr tablet  Generic drug:  glipiZIDE  TAKE TWO (2) TABLETS BY MOUTH DAILY     isosorbide  mononitrate 30 MG 24 hr tablet  Commonly known as:  IMDUR  Take 1 tablet (30 mg total) by mouth daily.     metoprolol succinate 50 MG 24 hr tablet  Commonly known as:  TOPROL-XL  Take 25 mg by mouth daily. Take with or immediately following a meal.     ONE TOUCH ULTRA TEST test strip  Generic drug:  glucose blood  USE TO CHECK BLOOD SUGAR 2 TIMES DAILY AS DIRECTED     ONETOUCH DELICA LANCETS 72Z Misc  predniSONE 20 MG tablet  Commonly known as:  DELTASONE  Take 2 tablets (40 mg total) by mouth daily with breakfast.  Start taking on:  10/10/2015     PRESERVISION AREDS PO  Take 1 tablet by mouth daily.     STIOLTO RESPIMAT 2.5-2.5 MCG/ACT Aers  Generic drug:  Tiotropium Bromide-Olodaterol  Inhale 2 puffs into the lungs every morning.     VITAMIN D-3 PO  Take 1,000 Units by mouth daily.           Follow-up Information    Follow up with Simonne Maffucci, MD In 2 weeks.   Specialty:  Pulmonary Disease   Why:  Follow-up appointment scheduled with Dr. Waunita Schooner July 25 at 3:45   Contact information:   [address] [place] [address] [phone]       Follow up with Owens Loffler, MD. Schedule an appointment as soon as possible for a visit in 1 week.   Specialty:  Family Medicine   Why:  As needed   Contact information:   [place] [address] [place] [address] [phone]       Follow up with [facility].   Why:  home oxygen   Contact information:   [address] [place] [address] [phone]       Follow up with [facility].   Why:  home health physical therapy and nurse   Contact information:   [address] [place] [address] [phone]      Allergies  Allergen Reactions  . Metformin And Related   . Tramadol Hcl     Tremor vs seizure activity  . Ace Inhibitors     REACTION: cough  . Atorvastatin     REACTION: weakness  . Codeine Nausea And Vomiting  . Morphine  Sulfate     REACTION: unspecified  . Ticlopidine Hcl     REACTION: unspecified  . Warfarin Sodium Other (See Comments)    Nausea and vomiting     Consultations: Pulmonology    Procedures/Studies: Dg Chest 2 View  10/06/2015  CLINICAL DATA:  Two week history of shortness of breath. EXAM: CHEST  2 VIEW COMPARISON:  CT scan 11/30/2014.  Chest x-ray 11/26/2014. FINDINGS: Two views study shows hyperexpansion. Diffuse interstitial and alveolar opacity in the right upper lung is new in the interval. There is some nodularity in fullness in the right hilum which was not present on the previous chest x-ray. Interstitial markings are diffusely coarsened with chronic features. The cardiopericardial silhouette is within normal limits for size. Patient is status post CABG IMPRESSION: Interval development of interstitial and alveolar opacity in the right upper lung with some fullness in the right hilum. Imaging features raise concern for central lesion with postobstructive process. CT chest with contrast recommended to further evaluate. Electronically Signed   By: Misty Stanley M.D.   On: 10/06/2015 18:48   Ct Angio Chest Pe W Or Wo Contrast  10/07/2015  ADDENDUM REPORT: 10/07/2015 18:33 ADDENDUM: Please note there is a typographical error in the impression of the original report. The correct impression should read: No CT evidence of pulmonary embolism. Electronically Signed   By: Anner Crete M.D.   On: 10/07/2015 18:33  10/07/2015  CLINICAL DATA:  82 year old male with shortness of breath and tachycardia EXAM: CT ANGIOGRAPHY CHEST WITH CONTRAST TECHNIQUE: Multidetector CT imaging of the chest was performed using the standard protocol during bolus administration of  intravenous contrast. Multiplanar CT image reconstructions and MIPs were obtained to evaluate the vascular anatomy. CONTRAST:  70 cc Isovue 370 COMPARISON:  Chest radiograph dated 10/06/2015 and CT dated 11/30/2014 FINDINGS: There is  emphysematous changes of the lungs. There is a large area of interstitial prominence and airspace density involving the right upper lobe, right middle lobe, and right lower lobe most compatible with pneumonia. A 2.0 x 3.5 cm more confluent density in the right upper lobe (series 5, image 43) also most likely an area of consolidation/infiltrate. Underlying mass is less likely but not excluded. Clinical correlation is recommended. There is a small right pleural effusion. A 1.9 x 4.8 cm right lower lobe subpleural bleb noted. The left lung is clear. There is no pneumothorax. The central airways are patent. There is atherosclerotic calcification of the thoracic aorta. There is no aneurysmal dilatation or evidence of dissection. There is a vascular stent at the origin of the left subclavian artery. Evaluation of the great vessels of the aortic arch and left subclavian artery stent is limited due to timing of the contrast. Evaluation of the pulmonary arteries is limited due to respiratory motion artifact and suboptimal visualization of the peripheral branches. No central pulmonary artery embolus identified. There is no cardiomegaly or pericardial effusion. There is coronary vascular calcification. CABG vascular clips noted. Set right hilar adenopathy. There is no mediastinal adenopathy. The esophagus is grossly unremarkable. No thyroid nodules identified. There is no axillary adenopathy. The chest wall soft tissues appear unremarkable. There is osteopenia with degenerative changes of the spine. No acute fracture. The visualized upper abdomen appear unremarkable. Review of the MIP images confirms the above findings. IMPRESSION: CT evidence of pulmonary embolism. Large area of interstitial and airspace opacity involving the right lung most compatible with pneumonia. Clinical correlation and follow-up resolution recommended. Small right pleural effusion. Electronically Signed: By: Elgie Collard M.D. On: 10/06/2015 21:04      Subjective:  Feels much better today.  Still wheezing, but feels that he is able to go to bathroom, perform basic ADLs with his usual amount of wheeze and dyspnea.  Asking to go home.    Discharge Exam: Filed Vitals:   10/09/15 0912 10/09/15 1024  BP: 176/56 147/61  Pulse: 75 86  Temp:  98.2 F (36.8 C)  Resp:  19   Filed Vitals:   10/09/15 0912 10/09/15 0917 10/09/15 1024 10/09/15 1238  BP: 176/56  147/61   Pulse: 75  86   Temp:   98.2 F (36.8 C)   TempSrc:      Resp:   19   Height:      Weight:      SpO2:  96% 93% 93%    General exam: Adult Male.  No acute distress. nasal cannula in place  HEENT: NCAT, MMM Respiratory system: Diminished bilateral breath sounds with high-pitched expiratory wheeze and prolonged expiratory phase  Cardiovascular system: Regular rate and rhythm, normal S1/S2. No murmurs, rubs, gallops or clicks. Warm extremities Gastrointestinal system: Normal active bowel sounds, soft, nondistended, nontender. MSK: Normal tone and bulk, no lower extremity edema Neuro: Grossly intact    The results of significant diagnostics from this hospitalization (including imaging, microbiology, ancillary and laboratory) are listed below for reference.     Microbiology: Recent Results (from the past 240 hour(s))  Culture, blood (Routine X 2) w Reflex to ID Panel     Status: None (Preliminary result)   Collection Time: 10/06/15  6:20 PM  Result Value Ref Range  Status   Specimen Description BLOOD RIGHT ANTECUBITAL  Final   Special Requests BOTTLES DRAWN AEROBIC AND ANAEROBIC 5CC  Final   Culture NO GROWTH 3 DAYS  Final   Report Status PENDING  Incomplete  Culture, blood (Routine X 2) w Reflex to ID Panel     Status: None (Preliminary result)   Collection Time: 10/06/15  7:20 PM  Result Value Ref Range Status   Specimen Description BLOOD LEFT ANTECUBITAL  Final   Special Requests BOTTLES DRAWN AEROBIC AND ANAEROBIC 5CC  Final   Culture NO GROWTH 3  DAYS  Final   Report Status PENDING  Incomplete  Urine culture     Status: Abnormal   Collection Time: 10/07/15  1:05 AM  Result Value Ref Range Status   Specimen Description URINE, RANDOM  Final   Special Requests NONE  Final   Culture MULTIPLE SPECIES PRESENT, SUGGEST RECOLLECTION (A)  Final   Report Status 10/08/2015 FINAL  Final     Labs: BNP (last 3 results)  Recent Labs  10/06/15 1838  BNP 562.1*   Basic Metabolic Panel:  Recent Labs Lab 10/06/15 1605 10/08/15 0552 10/09/15 0537  NA 137 136 137  K 5.3* 4.5 5.2*  CL 102 104 102  CO2 '28 24 26  '$ GLUCOSE 149* 120* 348*  BUN 24* 20 28*  CREATININE 1.35* 1.22 1.33*  CALCIUM 9.4 9.0 9.4   Liver Function Tests: No results for input(s): AST, ALT, ALKPHOS, BILITOT, PROT, ALBUMIN in the last 168 hours. No results for input(s): LIPASE, AMYLASE in the last 168 hours. No results for input(s): AMMONIA in the last 168 hours. CBC:  Recent Labs Lab 10/06/15 1605 10/08/15 0552  WBC 8.6 8.0  HGB 11.5* 10.6*  HCT 36.7* 34.7*  MCV 95.6 95.6  PLT 407* 341   Cardiac Enzymes: No results for input(s): CKTOTAL, CKMB, CKMBINDEX, TROPONINI in the last 168 hours. BNP: Invalid input(s): POCBNP CBG:  Recent Labs Lab 10/08/15 1145 10/08/15 1757 10/08/15 2211 10/09/15 0734 10/09/15 1143  GLUCAP 267* 342* 378* 319* 186*   D-Dimer No results for input(s): DDIMER in the last 72 hours. Hgb A1c No results for input(s): HGBA1C in the last 72 hours. Lipid Profile No results for input(s): CHOL, HDL, LDLCALC, TRIG, CHOLHDL, LDLDIRECT in the last 72 hours. Thyroid function studies No results for input(s): TSH, T4TOTAL, T3FREE, THYROIDAB in the last 72 hours.  Invalid input(s): FREET3 Anemia work up No results for input(s): VITAMINB12, FOLATE, FERRITIN, TIBC, IRON, RETICCTPCT in the last 72 hours. Urinalysis    Component Value Date/Time   COLORURINE YELLOW 10/07/2015 0105   APPEARANCEUR CLEAR 10/07/2015 0105   LABSPEC  1.037* 10/07/2015 0105   PHURINE 5.5 10/07/2015 0105   GLUCOSEU NEGATIVE 10/07/2015 0105   HGBUR NEGATIVE 10/07/2015 0105   BILIRUBINUR NEGATIVE 10/07/2015 0105   KETONESUR NEGATIVE 10/07/2015 0105   PROTEINUR 100* 10/07/2015 0105   NITRITE NEGATIVE 10/07/2015 0105   LEUKOCYTESUR NEGATIVE 10/07/2015 0105   Sepsis Labs Invalid input(s): PROCALCITONIN,  WBC,  LACTICIDVEN Microbiology Recent Results (from the past 240 hour(s))  Culture, blood (Routine X 2) w Reflex to ID Panel     Status: None (Preliminary result)   Collection Time: 10/06/15  6:20 PM  Result Value Ref Range Status   Specimen Description BLOOD RIGHT ANTECUBITAL  Final   Special Requests BOTTLES DRAWN AEROBIC AND ANAEROBIC 5CC  Final   Culture NO GROWTH 3 DAYS  Final   Report Status PENDING  Incomplete  Culture, blood (Routine X  2) w Reflex to ID Panel     Status: None (Preliminary result)   Collection Time: 10/06/15  7:20 PM  Result Value Ref Range Status   Specimen Description BLOOD LEFT ANTECUBITAL  Final   Special Requests BOTTLES DRAWN AEROBIC AND ANAEROBIC 5CC  Final   Culture NO GROWTH 3 DAYS  Final   Report Status PENDING  Incomplete  Urine culture     Status: Abnormal   Collection Time: 10/07/15  1:05 AM  Result Value Ref Range Status   Specimen Description URINE, RANDOM  Final   Special Requests NONE  Final   Culture MULTIPLE SPECIES PRESENT, SUGGEST RECOLLECTION (A)  Final   Report Status 10/08/2015 FINAL  Final     Time coordinating discharge: Over 30 minutes  SIGNED:   Janece Canterbury, MD  [facility] 10/09/2015, 7:22 PM Pager   If 7PM-7AM, please contact night-coverage [email or web address] Password [email or web address]

## 2015-10-09 NOTE — Care Management Note (Signed)
Case Management Note  Patient Details  Name: Timothy Cordova MRN: [id number] Date of Birth: 08-May-1928  Subjective/Objective:                  CAP (community acquired pneumonia) Action/Plan: Discharge planning Expected Discharge Date:  10/09/15               Expected Discharge Plan:  [place] [place]  In-House Referral:     Discharge planning Services  CM Consult  Post Acute Care Choice:    Choice offered to:  Patient  DME Arranged:  Oxygen DME Agency:  [place]:  PT, RN [facility] Agency:  [facility] ([facility])  Status of Service:  Completed, signed off  If discussed at [employer] of Stay Meetings, dates discussed:    Additional Comments: CM met with pt in room to offer choice of home health agency.  Pt chooses [facility] to render HHPT/RN.  Referral called to [employer], [place].  Cm called [facility] rep, Tiffany to please authorize home O2; CM called [facility] DME rep, to please deliver the home oxygen to the room so pt can discharge.  No other CM needs were communicated. Dellie Catholic, RN 10/09/2015, 10:27 AM

## 2015-10-09 NOTE — Progress Notes (Signed)
Timothy Cordova discharged Home with wife per MD order.  Discharge instructions reviewed and discussed with the patient, all questions and concerns answered. Copy of instructions, care notes for new medications & diagnosis and scripts given to patient.    Medication List    STOP taking these medications        levofloxacin 500 MG tablet  Commonly known as:  LEVAQUIN      TAKE these medications        acetaminophen 325 MG tablet  Commonly known as:  TYLENOL  Take 650 mg by mouth at bedtime.     albuterol 108 (90 Base) MCG/ACT inhaler  Commonly known as:  PROVENTIL HFA;VENTOLIN HFA  Inhale 2 puffs into the lungs every 6 (six) hours as needed for wheezing or shortness of breath.     amoxicillin-clavulanate 875-125 MG tablet  Commonly known as:  AUGMENTIN  Take 1 tablet by mouth 2 (two) times daily.     aspirin 81 MG EC tablet  Take 81 mg by mouth every morning.     atorvastatin 80 MG tablet  Commonly known as:  LIPITOR  Take 40 mg by mouth daily.     doxazosin 4 MG tablet  Commonly known as:  CARDURA  TAKE 1 TO 2 TABLETS BY MOUTH EVERY DAY AS DIRECTED.     furosemide 20 MG tablet  Commonly known as:  LASIX  Take 20 mg by mouth daily as needed for fluid.     GLIPIZIDE XL 10 MG 24 hr tablet  Generic drug:  glipiZIDE  TAKE TWO (2) TABLETS BY MOUTH DAILY     isosorbide mononitrate 30 MG 24 hr tablet  Commonly known as:  IMDUR  Take 1 tablet (30 mg total) by mouth daily.     metoprolol succinate 50 MG 24 hr tablet  Commonly known as:  TOPROL-XL  Take 25 mg by mouth daily. Take with or immediately following a meal.     ONE TOUCH ULTRA TEST test strip  Generic drug:  glucose blood  USE TO CHECK BLOOD SUGAR 2 TIMES DAILY AS DIRECTED     ONETOUCH DELICA LANCETS 33G Misc     predniSONE 20 MG tablet  Commonly known as:  DELTASONE  Take 2 tablets (40 mg total) by mouth daily with breakfast.  Start taking on:  10/10/2015     PRESERVISION AREDS PO  Take 1 tablet by  mouth daily.     STIOLTO RESPIMAT 2.5-2.5 MCG/ACT Aers  Generic drug:  Tiotropium Bromide-Olodaterol  Inhale 2 puffs into the lungs every morning.     VITAMIN D-3 PO  Take 1,000 Units by mouth daily.        Patients skin is clean, dry and intact, no evidence of skin break down. IV site discontinued and catheter remains intact. Site without signs and symptoms of complications. Dressing and pressure applied.  Patient escorted to car by NT in a wheelchair,  no distress noted upon discharge.  [name], Timothy Cordova 10/09/2015 2:24 PM

## 2015-10-10 DIAGNOSIS — E1122 Type 2 diabetes mellitus with diabetic chronic kidney disease: Secondary | ICD-10-CM | POA: Diagnosis not present

## 2015-10-10 DIAGNOSIS — Z87891 Personal history of nicotine dependence: Secondary | ICD-10-CM | POA: Diagnosis not present

## 2015-10-10 DIAGNOSIS — I110 Hypertensive heart disease with heart failure: Secondary | ICD-10-CM | POA: Diagnosis not present

## 2015-10-10 DIAGNOSIS — N183 Chronic kidney disease, stage 3 (moderate): Secondary | ICD-10-CM | POA: Diagnosis not present

## 2015-10-10 DIAGNOSIS — J440 Chronic obstructive pulmonary disease with acute lower respiratory infection: Secondary | ICD-10-CM | POA: Diagnosis not present

## 2015-10-10 DIAGNOSIS — Z951 Presence of aortocoronary bypass graft: Secondary | ICD-10-CM | POA: Diagnosis not present

## 2015-10-10 DIAGNOSIS — Z792 Long term (current) use of antibiotics: Secondary | ICD-10-CM | POA: Diagnosis not present

## 2015-10-10 DIAGNOSIS — Z7982 Long term (current) use of aspirin: Secondary | ICD-10-CM | POA: Diagnosis not present

## 2015-10-10 DIAGNOSIS — Z7984 Long term (current) use of oral hypoglycemic drugs: Secondary | ICD-10-CM | POA: Diagnosis not present

## 2015-10-10 DIAGNOSIS — I5033 Acute on chronic diastolic (congestive) heart failure: Secondary | ICD-10-CM | POA: Diagnosis not present

## 2015-10-10 DIAGNOSIS — J189 Pneumonia, unspecified organism: Secondary | ICD-10-CM | POA: Diagnosis not present

## 2015-10-10 DIAGNOSIS — I2510 Atherosclerotic heart disease of native coronary artery without angina pectoris: Secondary | ICD-10-CM | POA: Diagnosis not present

## 2015-10-11 ENCOUNTER — Telehealth: Payer: Self-pay

## 2015-10-11 LAB — LEGIONELLA PNEUMOPHILA SEROGP 1 UR AG: L. pneumophila Serogp 1 Ur Ag: NEGATIVE

## 2015-10-11 LAB — CULTURE, BLOOD (ROUTINE X 2)
CULTURE: NO GROWTH
Culture: NO GROWTH

## 2015-10-11 NOTE — Telephone Encounter (Signed)
Please confirm this verbal order

## 2015-10-11 NOTE — Telephone Encounter (Signed)
Transition Care Management Follow-up Telephone Call    Date discharged? 10/09/2015   How have you been since you were released from the hospital? Recovering; stable   Do you understand why you were in the hospital? Yes   Do you understand the discharge instructions? Yes   Where were you discharged to? Home   Items Reviewed:  Medications reviewed: Yes  Allergies reviewed: Yes  Dietary changes reviewed: Yes  Referrals reviewed: Yes   Functional Questionnaire:   Activities of Daily Living (ADLs):   Pt is independent with ADLs   Any transportation issues/concerns?: NO   Any patient concerns? NO   Confirmed importance and date/time of follow-up visits scheduled YES  Provider Appointment booked 10/18/15 @ 0900  Confirmed with patient if condition begins to worsen call PCP or go to the ER.  Patient was given the office number and encouraged to call back with question or concerns.  : YES

## 2015-10-11 NOTE — Telephone Encounter (Signed)
Pam [facility] left v/m requesting verbal orders for home health nursing 2 x a 2 week for weeks;2 x a week for 7 weeks and 2 weeks prn for med mgt and disease mgt. Pt discharged from hospital with pneumonia; pt on 2L O2. Pam request cb.

## 2015-10-11 NOTE — Telephone Encounter (Signed)
[facility] as instructed by Dr. Patsy Lager[place].

## 2015-10-13 ENCOUNTER — Encounter: Payer: Self-pay | Admitting: Family Medicine

## 2015-10-13 ENCOUNTER — Ambulatory Visit (INDEPENDENT_AMBULATORY_CARE_PROVIDER_SITE_OTHER): Payer: Medicare Other | Admitting: Family Medicine

## 2015-10-13 VITALS — BP 96/55 | HR 88 | Temp 97.4°F | Ht 67.5 in | Wt 186.5 lb

## 2015-10-13 DIAGNOSIS — J438 Other emphysema: Secondary | ICD-10-CM

## 2015-10-13 DIAGNOSIS — N183 Chronic kidney disease, stage 3 (moderate): Secondary | ICD-10-CM | POA: Diagnosis not present

## 2015-10-13 DIAGNOSIS — Z7984 Long term (current) use of oral hypoglycemic drugs: Secondary | ICD-10-CM | POA: Diagnosis not present

## 2015-10-13 DIAGNOSIS — J440 Chronic obstructive pulmonary disease with acute lower respiratory infection: Secondary | ICD-10-CM | POA: Diagnosis not present

## 2015-10-13 DIAGNOSIS — Z7982 Long term (current) use of aspirin: Secondary | ICD-10-CM | POA: Diagnosis not present

## 2015-10-13 DIAGNOSIS — E1122 Type 2 diabetes mellitus with diabetic chronic kidney disease: Secondary | ICD-10-CM | POA: Diagnosis not present

## 2015-10-13 DIAGNOSIS — D509 Iron deficiency anemia, unspecified: Secondary | ICD-10-CM

## 2015-10-13 DIAGNOSIS — Z792 Long term (current) use of antibiotics: Secondary | ICD-10-CM | POA: Diagnosis not present

## 2015-10-13 DIAGNOSIS — J189 Pneumonia, unspecified organism: Secondary | ICD-10-CM

## 2015-10-13 DIAGNOSIS — Z951 Presence of aortocoronary bypass graft: Secondary | ICD-10-CM | POA: Diagnosis not present

## 2015-10-13 DIAGNOSIS — K922 Gastrointestinal hemorrhage, unspecified: Secondary | ICD-10-CM

## 2015-10-13 DIAGNOSIS — I110 Hypertensive heart disease with heart failure: Secondary | ICD-10-CM | POA: Diagnosis not present

## 2015-10-13 DIAGNOSIS — Z87891 Personal history of nicotine dependence: Secondary | ICD-10-CM | POA: Diagnosis not present

## 2015-10-13 DIAGNOSIS — I2510 Atherosclerotic heart disease of native coronary artery without angina pectoris: Secondary | ICD-10-CM | POA: Diagnosis not present

## 2015-10-13 DIAGNOSIS — I5033 Acute on chronic diastolic (congestive) heart failure: Secondary | ICD-10-CM | POA: Diagnosis not present

## 2015-10-13 DIAGNOSIS — J9601 Acute respiratory failure with hypoxia: Secondary | ICD-10-CM

## 2015-10-13 LAB — BASIC METABOLIC PANEL
BUN: 35 mg/dL — AB (ref 6–23)
CHLORIDE: 103 meq/L (ref 96–112)
CO2: 31 mEq/L (ref 19–32)
Calcium: 9.7 mg/dL (ref 8.4–10.5)
Creatinine, Ser: 1.27 mg/dL (ref 0.40–1.50)
GFR: 57.22 mL/min — ABNORMAL LOW (ref >60.00)
Glucose, Bld: 117 mg/dL — ABNORMAL HIGH (ref 70–99)
POTASSIUM: 4.7 meq/L (ref 3.5–5.1)
Sodium: 139 mEq/L (ref 135–145)

## 2015-10-13 MED ORDER — METHYLPREDNISOLONE ACETATE 80 MG/ML IJ SUSP
80.0000 mg | Freq: Once | INTRAMUSCULAR | Status: AC
  Administered 2015-10-13: 80 mg via INTRAMUSCULAR

## 2015-10-13 NOTE — Progress Notes (Signed)
Dr. Karleen Hampshire T. Lekeisha Arenas, MD, CAQ Sports Medicine Primary Care and Sports Medicine [address] [place], [address] Phone: [phone] Fax: [phone]  10/13/2015  Patient: Timothy Cordova, MRN: [id number], DOB: May 15, 1928, 82 y.o.  Primary Physician:  Hannah Beat, MD   Chief Complaint  Patient presents with  . Rectal Bleeding   Subjective:   Timothy Cordova is a 82 y.o. very pleasant male patient who presents with the following:  Just d/c from hospital.  Admit date: 10/06/2015 Discharge date: 10/09/2015  At that time, the patient was admitted, was part of his hospitalization in the intensive care unit, and was diagnosed with a right-sided community-acquired pneumonia along with COPD exacerbation and acute respiratory failure and hypoxia. He was discharged on oxygen. He has been on prednisone for 10 days at the end of May and early June as well as oral prednisone for 7 days at the time of his hospital discharge.  This morning, he started to develop some bleeding from his rectum, and told his wife. His only been a small amount, and some in his underwear and some when he was wiping.  Rectal bleeding. Has been on prednisone and Amoxicillin  He is here calm. By his wife. He has been noncompliant with wearing his oxygen at home.  He also has had a 20 pound weight loss in the last 2 months. He has follow-up with pulmonology who will be doing a follow-up chest film to ensure that his opacity clears.  Past Medical History, Surgical History, Social History, Family History, Problem List, Medications, and Allergies have been reviewed and updated if relevant.  Patient Active Problem List   Diagnosis Date Noted  . CKD (chronic kidney disease) stage 3, GFR 30-59 ml/min 08/18/2010    Priority: High  . DIASTOLIC HEART FAILURE, CHRONIC 08/18/2009    Priority: High  . CAD, AUTOLOGOUS BYPASS GRAFT 06/07/2008    Priority: High  . DM (diabetes mellitus), type 2 with renal  complications (HCC) 09/04/2007    Priority: High  . Centrilobular emphysema (HCC) 08/08/2005    Priority: High  . Acute on chronic diastolic heart failure (HCC) 10/08/2015  . Chronic obstructive pulmonary disease (HCC)   . CAP (community acquired pneumonia) 10/06/2015  . Pneumonia involving right lung 10/06/2015  . Anemia 11/26/2014  . Aortic calcification, 04/20/2005 CT Chest 04/03/2013  . Sleep apnea   . Tobacco abuse (in remission)   . Chronic low back pain 04/18/2011  . Renal lesion 03/27/2011  . OSTEOARTHRITIS 12/06/2009  . Mixed hyperlipidemia 06/11/2008  . ALLERGIC RHINITIS 01/30/2007  . DEMENTIA 07/06/2006  . HEARING LOSS 07/06/2006  . Essential hypertension 07/06/2006  . BENIGN PROSTATIC HYPERTROPHY 07/06/2006    Past Medical History  Diagnosis Date  . Coronary artery disease     s/p CABG 2006. LHC (1/10): SVG-OM and PLOM, SVG-D, and LIMA-LAD were all patent  . Hypertension   . COPD (chronic obstructive pulmonary disease) (HCC)     moderate. followed by Dr.Byrum  . Chronic diastolic heart failure (HCC)     8/08 ECHO with EF 55%  . Edema     localized. suspect diastolic CHF + venous insufficiency  . Mixed hyperlipidemia   . Sleep apnea     ?  . Diabetes mellitus   . History of tobacco abuse   . Allergic rhinitis   . Hypercholesterolemia   . Benign prostatic hypertrophy   . Osteoarthritis   . Dementia     (mild)--06/2006  . Hearing loss  left >>right  . CKD (chronic kidney disease)     Past Surgical History  Procedure Laterality Date  . Angioplasty  [id number],1996  . Rotator cuff repair  2005    left, [place]  . Coronary artery bypass graft  02/2005  . Cardiac catheterization  90s    [place] x1stent  . Cardiac catheterization      [facility];x2 stents  . Cardiac catheterization  04/29/2013    Social History   Social History  . Marital Status: Married    Spouse Name: N/A  . Number of Children: N/A  . Years of Education: N/A   Occupational History    . rubber fabrication     RETIRED   Social History Main Topics  . Smoking status: Former Smoker -- 3.00 packs/day for 60 years    Types: Cigarettes    Quit date: 07/12/2005  . Smokeless tobacco: Never Used  . Alcohol Use: No  . Drug Use: No  . Sexual Activity: Not on file   Other Topics Concern  . Not on file   Social History Narrative    Family History  Problem Relation Age of Onset  . Heart attack Mother   . Lung cancer Sister     Allergies  Allergen Reactions  . Metformin And Related   . Tramadol Hcl     Tremor vs seizure activity  . Ace Inhibitors     REACTION: cough  . Atorvastatin     REACTION: weakness  . Codeine Nausea And Vomiting  . Morphine Sulfate     REACTION: unspecified  . Ticlopidine Hcl     REACTION: unspecified  . Warfarin Sodium Other (See Comments)    Nausea and vomiting     Medication list reviewed and updated in full in [facility].  ROS: GEN: Acute illness details above GI: Tolerating PO intake, but decreased compared to baseline. GU: maintaining adequate hydration and urination Pulm: + SOB Interactive and getting along well at home. Otherwise, the pertinent positives and negatives are listed above and in the HPI, otherwise a full review of systems has been reviewed and is negative unless noted positive.   Objective:   BP 96/55 mmHg  Pulse 88  Temp(Src) 97.4 F (36.3 C) (Oral)  Ht 5' 7.5" (1.715 m)  Wt 186 lb 8 oz (84.596 kg)  BMI 28.76 kg/m2  SpO2 90%  GEN: WDWN, NAD, Non-toxic, A & O x 3 HEENT: Atraumatic, Normocephalic. Neck supple. No masses, No LAD. Ears and Nose: No external deformity. CV: RRR, No M/G/R. No JVD. No thrill. No extra heart sounds. PULM: CTA B, no focal wheezes or crackles, no rhonchi. No retractions. No resp. distress. No accessory muscle use. ABD: S, NT, ND, +BS. No rebound. No HSM. EXTR: No c/c/e NEURO Normal gait.  PSYCH: Normally interactive. Conversant. Not depressed or anxious appearing.   Calm demeanor.     Laboratory and Imaging Data: Dg Chest 2 View  10/06/2015  CLINICAL DATA:  Two week history of shortness of breath. EXAM: CHEST  2 VIEW COMPARISON:  CT scan 11/30/2014.  Chest x-ray 11/26/2014. FINDINGS: Two views study shows hyperexpansion. Diffuse interstitial and alveolar opacity in the right upper lung is new in the interval. There is some nodularity in fullness in the right hilum which was not present on the previous chest x-ray. Interstitial markings are diffusely coarsened with chronic features. The cardiopericardial silhouette is within normal limits for size. Patient is status post CABG IMPRESSION: Interval development of interstitial and alveolar  opacity in the right upper lung with some fullness in the right hilum. Imaging features raise concern for central lesion with postobstructive process. CT chest with contrast recommended to further evaluate. Electronically Signed   By: Kennith CenterEric  Mansell M.D.   On: 10/06/2015 18:48   Ct Angio Chest Pe W Or Wo Contrast  10/07/2015  ADDENDUM REPORT: 10/07/2015 18:33 ADDENDUM: Please note there is a typographical error in the impression of the original report. The correct impression should read: No CT evidence of pulmonary embolism. Electronically Signed   By: Elgie CollardArash  Radparvar M.D.   On: 10/07/2015 18:33  10/07/2015  CLINICAL DATA:  82 year old male with shortness of breath and tachycardia EXAM: CT ANGIOGRAPHY CHEST WITH CONTRAST TECHNIQUE: Multidetector CT imaging of the chest was performed using the standard protocol during bolus administration of intravenous contrast. Multiplanar CT image reconstructions and MIPs were obtained to evaluate the vascular anatomy. CONTRAST:  70 cc Isovue 370 COMPARISON:  Chest radiograph dated 10/06/2015 and CT dated 11/30/2014 FINDINGS: There is emphysematous changes of the lungs. There is a large area of interstitial prominence and airspace density involving the right upper lobe, right middle lobe, and right  lower lobe most compatible with pneumonia. A 2.0 x 3.5 cm more confluent density in the right upper lobe (series 5, image 43) also most likely an area of consolidation/infiltrate. Underlying mass is less likely but not excluded. Clinical correlation is recommended. There is a small right pleural effusion. A 1.9 x 4.8 cm right lower lobe subpleural bleb noted. The left lung is clear. There is no pneumothorax. The central airways are patent. There is atherosclerotic calcification of the thoracic aorta. There is no aneurysmal dilatation or evidence of dissection. There is a vascular stent at the origin of the left subclavian artery. Evaluation of the great vessels of the aortic arch and left subclavian artery stent is limited due to timing of the contrast. Evaluation of the pulmonary arteries is limited due to respiratory motion artifact and suboptimal visualization of the peripheral branches. No central pulmonary artery embolus identified. There is no cardiomegaly or pericardial effusion. There is coronary vascular calcification. CABG vascular clips noted. Set right hilar adenopathy. There is no mediastinal adenopathy. The esophagus is grossly unremarkable. No thyroid nodules identified. There is no axillary adenopathy. The chest wall soft tissues appear unremarkable. There is osteopenia with degenerative changes of the spine. No acute fracture. The visualized upper abdomen appear unremarkable. Review of the MIP images confirms the above findings. IMPRESSION: CT evidence of pulmonary embolism. Large area of interstitial and airspace opacity involving the right lung most compatible with pneumonia. Clinical correlation and follow-up resolution recommended. Small right pleural effusion. Electronically Signed: By: Elgie CollardArash  Radparvar M.D. On: 10/06/2015 21:04     Assessment and Plan:   Gastrointestinal hemorrhage, unspecified gastritis, unspecified gastrointestinal hemorrhage type  Other emphysema (HCC)  CAP  (community acquired pneumonia)  Iron deficiency anemia - Plan: CBC with Differential/Platelet  Chronic kidney disease (CKD), stage 3 (moderate) - Plan: Basic metabolic panel  Acute respiratory failure with hypoxia (HCC)  MDM Number of Diagnoses or Management Options Acute respiratory failure with hypoxia (HCC):  CAP (community acquired pneumonia):  Chronic kidney disease (CKD), stage 3 (moderate):  Gastrointestinal hemorrhage, unspecified gastritis, unspecified gastrointestinal hemorrhage type:  Iron deficiency anemia:  Other emphysema (HCC):    Level of Medical Decision-Making in this case is high  Hemoccult positive in the office. At this point, this appears to be a low-level GI bleed. For now, stop aspirin, stop prednisone.  He was given 80 milligrams of Depo-Medrol IM in the office to bypass his GI tract.  Strict precautions were reviewed with the patient and his wife.  He does still appear to be having hypoxia, particularly with walking. Strongly encouraged him to continue wearing oxygen until one of the pulmonologist told him to stop.  Check CBC. Check BMP.  He already has pulmonology follow-up set up.  I am planning to see him face-to-face in 2 weeks.  Follow-up: Return in about 2 weeks (around 10/27/2015).  Orders Placed This Encounter  Procedures  . CBC with Differential/Platelet  . Basic metabolic panel   Patient Instructions  STOP your aspirin for right now. STOP your prednisone.      Signed,  Elpidio Galea. Natiya Seelinger, MD   Patient's Medications  New Prescriptions   No medications on file  Previous Medications   ACETAMINOPHEN (TYLENOL) 325 MG TABLET    Take 650 mg by mouth at bedtime.   ALBUTEROL (PROVENTIL HFA;VENTOLIN HFA) 108 (90 BASE) MCG/ACT INHALER    Inhale 2 puffs into the lungs every 6 (six) hours as needed for wheezing or shortness of breath.   AMOXICILLIN-CLAVULANATE (AUGMENTIN) 875-125 MG TABLET    Take 1 tablet by mouth 2 (two) times daily.    ASPIRIN 81 MG EC TABLET    Take 81 mg by mouth every morning.    ATORVASTATIN (LIPITOR) 80 MG TABLET    Take 40 mg by mouth daily.   CHOLECALCIFEROL (VITAMIN D-3 PO)    Take 1,000 Units by mouth daily.    DOXAZOSIN (CARDURA) 4 MG TABLET    TAKE 1 TO 2 TABLETS BY MOUTH EVERY DAY AS DIRECTED.   FUROSEMIDE (LASIX) 20 MG TABLET    Take 20 mg by mouth daily as needed for fluid.    GLIPIZIDE XL 10 MG 24 HR TABLET    TAKE TWO (2) TABLETS BY MOUTH DAILY   ISOSORBIDE MONONITRATE (IMDUR) 30 MG 24 HR TABLET    Take 1 tablet (30 mg total) by mouth daily.   METOPROLOL SUCCINATE (TOPROL-XL) 50 MG 24 HR TABLET    Take 25 mg by mouth daily. Take with or immediately following a meal.   MULTIPLE VITAMINS-MINERALS (PRESERVISION AREDS PO)    Take 1 tablet by mouth daily.    ONE TOUCH ULTRA TEST TEST STRIP    USE TO CHECK BLOOD SUGAR 2 TIMES DAILY AS DIRECTED   ONETOUCH DELICA LANCETS 33G MISC       PREDNISONE (DELTASONE) 20 MG TABLET    Take 2 tablets (40 mg total) by mouth daily with breakfast.   TIOTROPIUM BROMIDE-OLODATEROL (STIOLTO RESPIMAT) 2.5-2.5 MCG/ACT AERS    Inhale 2 puffs into the lungs every morning.   Modified Medications   No medications on file  Discontinued Medications   No medications on file

## 2015-10-13 NOTE — Progress Notes (Signed)
Pre visit review using our clinic review tool, if applicable. No additional management support is needed unless otherwise documented below in the visit note. 

## 2015-10-13 NOTE — Patient Instructions (Signed)
STOP your aspirin for right now. STOP your prednisone.

## 2015-10-13 NOTE — Addendum Note (Signed)
Addended by: Damita LackLORING, DONNA S on: 10/13/2015 02:27 PM   Modules accepted: Orders

## 2015-10-14 ENCOUNTER — Other Ambulatory Visit: Payer: Medicare Other

## 2015-10-14 DIAGNOSIS — N183 Chronic kidney disease, stage 3 (moderate): Secondary | ICD-10-CM | POA: Diagnosis not present

## 2015-10-14 DIAGNOSIS — Z7982 Long term (current) use of aspirin: Secondary | ICD-10-CM | POA: Diagnosis not present

## 2015-10-14 DIAGNOSIS — Z87891 Personal history of nicotine dependence: Secondary | ICD-10-CM | POA: Diagnosis not present

## 2015-10-14 DIAGNOSIS — I2510 Atherosclerotic heart disease of native coronary artery without angina pectoris: Secondary | ICD-10-CM | POA: Diagnosis not present

## 2015-10-14 DIAGNOSIS — I110 Hypertensive heart disease with heart failure: Secondary | ICD-10-CM | POA: Diagnosis not present

## 2015-10-14 DIAGNOSIS — J189 Pneumonia, unspecified organism: Secondary | ICD-10-CM | POA: Diagnosis not present

## 2015-10-14 DIAGNOSIS — I5033 Acute on chronic diastolic (congestive) heart failure: Secondary | ICD-10-CM | POA: Diagnosis not present

## 2015-10-14 DIAGNOSIS — E1122 Type 2 diabetes mellitus with diabetic chronic kidney disease: Secondary | ICD-10-CM | POA: Diagnosis not present

## 2015-10-14 DIAGNOSIS — Z7984 Long term (current) use of oral hypoglycemic drugs: Secondary | ICD-10-CM | POA: Diagnosis not present

## 2015-10-14 DIAGNOSIS — D729 Disorder of white blood cells, unspecified: Secondary | ICD-10-CM | POA: Diagnosis not present

## 2015-10-14 DIAGNOSIS — Z951 Presence of aortocoronary bypass graft: Secondary | ICD-10-CM | POA: Diagnosis not present

## 2015-10-14 DIAGNOSIS — Z792 Long term (current) use of antibiotics: Secondary | ICD-10-CM | POA: Diagnosis not present

## 2015-10-14 DIAGNOSIS — J440 Chronic obstructive pulmonary disease with acute lower respiratory infection: Secondary | ICD-10-CM | POA: Diagnosis not present

## 2015-10-14 LAB — CBC WITH DIFFERENTIAL/PLATELET
BASOS ABS: 0 10*3/uL (ref 0.0–0.1)
Basophils Relative: 0 % (ref 0.0–3.0)
EOS ABS: 0.5 10*3/uL (ref 0.0–0.7)
Eosinophils Relative: 3.9 % (ref 0.0–5.0)
HEMATOCRIT: 35.3 % — AB (ref 39.0–52.0)
Hemoglobin: 11.2 g/dL — ABNORMAL LOW (ref 13.0–17.0)
LYMPHS PCT: 10 % — AB (ref 12.0–46.0)
Lymphs Abs: 1.4 10*3/uL (ref 0.7–4.0)
MCHC: 31.7 g/dL (ref 30.0–36.0)
MCV: 92.7 fl (ref 78.0–100.0)
Monocytes Absolute: 1.2 10*3/uL — ABNORMAL HIGH (ref 0.1–1.0)
Monocytes Relative: 8.5 % (ref 3.0–12.0)
NEUTROS ABS: 10.7 10*3/uL — AB (ref 1.4–7.7)
Neutrophils Relative %: 77.6 % — ABNORMAL HIGH (ref 43.0–77.0)
PLATELETS: 418 10*3/uL — AB (ref 150.0–400.0)
RBC: 3.81 Mil/uL — ABNORMAL LOW (ref 4.22–5.81)
RDW: 14.4 % (ref 11.5–15.5)
WBC: 13.8 10*3/uL — AB (ref 4.0–10.5)

## 2015-10-15 ENCOUNTER — Telehealth: Payer: Self-pay | Admitting: Family Medicine

## 2015-10-15 DIAGNOSIS — N183 Chronic kidney disease, stage 3 (moderate): Secondary | ICD-10-CM | POA: Diagnosis not present

## 2015-10-15 DIAGNOSIS — I110 Hypertensive heart disease with heart failure: Secondary | ICD-10-CM | POA: Diagnosis not present

## 2015-10-15 DIAGNOSIS — Z87891 Personal history of nicotine dependence: Secondary | ICD-10-CM | POA: Diagnosis not present

## 2015-10-15 DIAGNOSIS — E1122 Type 2 diabetes mellitus with diabetic chronic kidney disease: Secondary | ICD-10-CM | POA: Diagnosis not present

## 2015-10-15 DIAGNOSIS — Z7984 Long term (current) use of oral hypoglycemic drugs: Secondary | ICD-10-CM | POA: Diagnosis not present

## 2015-10-15 DIAGNOSIS — I2510 Atherosclerotic heart disease of native coronary artery without angina pectoris: Secondary | ICD-10-CM | POA: Diagnosis not present

## 2015-10-15 DIAGNOSIS — Z951 Presence of aortocoronary bypass graft: Secondary | ICD-10-CM | POA: Diagnosis not present

## 2015-10-15 DIAGNOSIS — I5033 Acute on chronic diastolic (congestive) heart failure: Secondary | ICD-10-CM | POA: Diagnosis not present

## 2015-10-15 DIAGNOSIS — J440 Chronic obstructive pulmonary disease with acute lower respiratory infection: Secondary | ICD-10-CM | POA: Diagnosis not present

## 2015-10-15 DIAGNOSIS — Z7982 Long term (current) use of aspirin: Secondary | ICD-10-CM | POA: Diagnosis not present

## 2015-10-15 DIAGNOSIS — Z792 Long term (current) use of antibiotics: Secondary | ICD-10-CM | POA: Diagnosis not present

## 2015-10-15 DIAGNOSIS — J189 Pneumonia, unspecified organism: Secondary | ICD-10-CM | POA: Diagnosis not present

## 2015-10-15 LAB — PATHOLOGIST SMEAR REVIEW

## 2015-10-15 NOTE — Telephone Encounter (Signed)
Verbal order given to Cala [facility]Kimberly at [facility] for Home PT 2x week for 8 weeks.

## 2015-10-15 NOTE — Telephone Encounter (Signed)
Kimberly @ [facility]  Needs order for home health PT 2x week for 8 weeks

## 2015-10-15 NOTE — Telephone Encounter (Signed)
agree

## 2015-10-18 ENCOUNTER — Ambulatory Visit: Payer: Medicare Other | Admitting: Family Medicine

## 2015-10-18 DIAGNOSIS — Z951 Presence of aortocoronary bypass graft: Secondary | ICD-10-CM | POA: Diagnosis not present

## 2015-10-18 DIAGNOSIS — J440 Chronic obstructive pulmonary disease with acute lower respiratory infection: Secondary | ICD-10-CM | POA: Diagnosis not present

## 2015-10-18 DIAGNOSIS — N183 Chronic kidney disease, stage 3 (moderate): Secondary | ICD-10-CM | POA: Diagnosis not present

## 2015-10-18 DIAGNOSIS — I5033 Acute on chronic diastolic (congestive) heart failure: Secondary | ICD-10-CM | POA: Diagnosis not present

## 2015-10-18 DIAGNOSIS — Z87891 Personal history of nicotine dependence: Secondary | ICD-10-CM | POA: Diagnosis not present

## 2015-10-18 DIAGNOSIS — I110 Hypertensive heart disease with heart failure: Secondary | ICD-10-CM | POA: Diagnosis not present

## 2015-10-18 DIAGNOSIS — E1122 Type 2 diabetes mellitus with diabetic chronic kidney disease: Secondary | ICD-10-CM | POA: Diagnosis not present

## 2015-10-18 DIAGNOSIS — J189 Pneumonia, unspecified organism: Secondary | ICD-10-CM | POA: Diagnosis not present

## 2015-10-18 DIAGNOSIS — Z792 Long term (current) use of antibiotics: Secondary | ICD-10-CM | POA: Diagnosis not present

## 2015-10-18 DIAGNOSIS — Z7982 Long term (current) use of aspirin: Secondary | ICD-10-CM | POA: Diagnosis not present

## 2015-10-18 DIAGNOSIS — I2510 Atherosclerotic heart disease of native coronary artery without angina pectoris: Secondary | ICD-10-CM | POA: Diagnosis not present

## 2015-10-18 DIAGNOSIS — Z7984 Long term (current) use of oral hypoglycemic drugs: Secondary | ICD-10-CM | POA: Diagnosis not present

## 2015-10-19 DIAGNOSIS — E1122 Type 2 diabetes mellitus with diabetic chronic kidney disease: Secondary | ICD-10-CM | POA: Diagnosis not present

## 2015-10-19 DIAGNOSIS — Z7984 Long term (current) use of oral hypoglycemic drugs: Secondary | ICD-10-CM | POA: Diagnosis not present

## 2015-10-19 DIAGNOSIS — J440 Chronic obstructive pulmonary disease with acute lower respiratory infection: Secondary | ICD-10-CM | POA: Diagnosis not present

## 2015-10-19 DIAGNOSIS — I2510 Atherosclerotic heart disease of native coronary artery without angina pectoris: Secondary | ICD-10-CM | POA: Diagnosis not present

## 2015-10-19 DIAGNOSIS — Z792 Long term (current) use of antibiotics: Secondary | ICD-10-CM | POA: Diagnosis not present

## 2015-10-19 DIAGNOSIS — Z87891 Personal history of nicotine dependence: Secondary | ICD-10-CM | POA: Diagnosis not present

## 2015-10-19 DIAGNOSIS — N183 Chronic kidney disease, stage 3 (moderate): Secondary | ICD-10-CM | POA: Diagnosis not present

## 2015-10-19 DIAGNOSIS — Z951 Presence of aortocoronary bypass graft: Secondary | ICD-10-CM | POA: Diagnosis not present

## 2015-10-19 DIAGNOSIS — I5033 Acute on chronic diastolic (congestive) heart failure: Secondary | ICD-10-CM | POA: Diagnosis not present

## 2015-10-19 DIAGNOSIS — J189 Pneumonia, unspecified organism: Secondary | ICD-10-CM | POA: Diagnosis not present

## 2015-10-19 DIAGNOSIS — I110 Hypertensive heart disease with heart failure: Secondary | ICD-10-CM | POA: Diagnosis not present

## 2015-10-19 DIAGNOSIS — Z7982 Long term (current) use of aspirin: Secondary | ICD-10-CM | POA: Diagnosis not present

## 2015-10-20 DIAGNOSIS — I2510 Atherosclerotic heart disease of native coronary artery without angina pectoris: Secondary | ICD-10-CM | POA: Diagnosis not present

## 2015-10-20 DIAGNOSIS — N183 Chronic kidney disease, stage 3 (moderate): Secondary | ICD-10-CM | POA: Diagnosis not present

## 2015-10-20 DIAGNOSIS — Z87891 Personal history of nicotine dependence: Secondary | ICD-10-CM | POA: Diagnosis not present

## 2015-10-20 DIAGNOSIS — Z951 Presence of aortocoronary bypass graft: Secondary | ICD-10-CM | POA: Diagnosis not present

## 2015-10-20 DIAGNOSIS — Z7984 Long term (current) use of oral hypoglycemic drugs: Secondary | ICD-10-CM | POA: Diagnosis not present

## 2015-10-20 DIAGNOSIS — I110 Hypertensive heart disease with heart failure: Secondary | ICD-10-CM | POA: Diagnosis not present

## 2015-10-20 DIAGNOSIS — J440 Chronic obstructive pulmonary disease with acute lower respiratory infection: Secondary | ICD-10-CM | POA: Diagnosis not present

## 2015-10-20 DIAGNOSIS — I5033 Acute on chronic diastolic (congestive) heart failure: Secondary | ICD-10-CM | POA: Diagnosis not present

## 2015-10-20 DIAGNOSIS — J189 Pneumonia, unspecified organism: Secondary | ICD-10-CM | POA: Diagnosis not present

## 2015-10-20 DIAGNOSIS — E1122 Type 2 diabetes mellitus with diabetic chronic kidney disease: Secondary | ICD-10-CM | POA: Diagnosis not present

## 2015-10-20 DIAGNOSIS — Z792 Long term (current) use of antibiotics: Secondary | ICD-10-CM | POA: Diagnosis not present

## 2015-10-20 DIAGNOSIS — Z7982 Long term (current) use of aspirin: Secondary | ICD-10-CM | POA: Diagnosis not present

## 2015-10-21 DIAGNOSIS — J440 Chronic obstructive pulmonary disease with acute lower respiratory infection: Secondary | ICD-10-CM | POA: Diagnosis not present

## 2015-10-21 DIAGNOSIS — Z7984 Long term (current) use of oral hypoglycemic drugs: Secondary | ICD-10-CM | POA: Diagnosis not present

## 2015-10-21 DIAGNOSIS — N183 Chronic kidney disease, stage 3 (moderate): Secondary | ICD-10-CM | POA: Diagnosis not present

## 2015-10-21 DIAGNOSIS — I5033 Acute on chronic diastolic (congestive) heart failure: Secondary | ICD-10-CM | POA: Diagnosis not present

## 2015-10-21 DIAGNOSIS — Z951 Presence of aortocoronary bypass graft: Secondary | ICD-10-CM | POA: Diagnosis not present

## 2015-10-21 DIAGNOSIS — Z87891 Personal history of nicotine dependence: Secondary | ICD-10-CM | POA: Diagnosis not present

## 2015-10-21 DIAGNOSIS — E1122 Type 2 diabetes mellitus with diabetic chronic kidney disease: Secondary | ICD-10-CM | POA: Diagnosis not present

## 2015-10-21 DIAGNOSIS — I110 Hypertensive heart disease with heart failure: Secondary | ICD-10-CM | POA: Diagnosis not present

## 2015-10-21 DIAGNOSIS — J189 Pneumonia, unspecified organism: Secondary | ICD-10-CM | POA: Diagnosis not present

## 2015-10-21 DIAGNOSIS — Z792 Long term (current) use of antibiotics: Secondary | ICD-10-CM | POA: Diagnosis not present

## 2015-10-21 DIAGNOSIS — I2510 Atherosclerotic heart disease of native coronary artery without angina pectoris: Secondary | ICD-10-CM | POA: Diagnosis not present

## 2015-10-21 DIAGNOSIS — Z7982 Long term (current) use of aspirin: Secondary | ICD-10-CM | POA: Diagnosis not present

## 2015-10-22 ENCOUNTER — Telehealth: Payer: Self-pay

## 2015-10-22 DIAGNOSIS — Z792 Long term (current) use of antibiotics: Secondary | ICD-10-CM | POA: Diagnosis not present

## 2015-10-22 DIAGNOSIS — N183 Chronic kidney disease, stage 3 (moderate): Secondary | ICD-10-CM | POA: Diagnosis not present

## 2015-10-22 DIAGNOSIS — I5033 Acute on chronic diastolic (congestive) heart failure: Secondary | ICD-10-CM | POA: Diagnosis not present

## 2015-10-22 DIAGNOSIS — E1122 Type 2 diabetes mellitus with diabetic chronic kidney disease: Secondary | ICD-10-CM | POA: Diagnosis not present

## 2015-10-22 DIAGNOSIS — Z7984 Long term (current) use of oral hypoglycemic drugs: Secondary | ICD-10-CM | POA: Diagnosis not present

## 2015-10-22 DIAGNOSIS — Z87891 Personal history of nicotine dependence: Secondary | ICD-10-CM | POA: Diagnosis not present

## 2015-10-22 DIAGNOSIS — Z951 Presence of aortocoronary bypass graft: Secondary | ICD-10-CM | POA: Diagnosis not present

## 2015-10-22 DIAGNOSIS — J440 Chronic obstructive pulmonary disease with acute lower respiratory infection: Secondary | ICD-10-CM | POA: Diagnosis not present

## 2015-10-22 DIAGNOSIS — I110 Hypertensive heart disease with heart failure: Secondary | ICD-10-CM | POA: Diagnosis not present

## 2015-10-22 DIAGNOSIS — I2510 Atherosclerotic heart disease of native coronary artery without angina pectoris: Secondary | ICD-10-CM | POA: Diagnosis not present

## 2015-10-22 DIAGNOSIS — Z7982 Long term (current) use of aspirin: Secondary | ICD-10-CM | POA: Diagnosis not present

## 2015-10-22 DIAGNOSIS — J189 Pneumonia, unspecified organism: Secondary | ICD-10-CM | POA: Diagnosis not present

## 2015-10-22 NOTE — Telephone Encounter (Signed)
Timothy FolksAmanda with [facility] left v/m requesting verbal order for home health OT for eval and treat.Please advise.

## 2015-10-25 NOTE — Telephone Encounter (Signed)
Verbal order given to [name] at [facility] for OT to evaluate and treat per Dr. Patsy Lageropland.

## 2015-10-25 NOTE — Telephone Encounter (Signed)
Agree, please help

## 2015-10-26 ENCOUNTER — Telehealth: Payer: Self-pay | Admitting: Family Medicine

## 2015-10-26 DIAGNOSIS — J189 Pneumonia, unspecified organism: Secondary | ICD-10-CM | POA: Diagnosis not present

## 2015-10-26 DIAGNOSIS — N183 Chronic kidney disease, stage 3 (moderate): Secondary | ICD-10-CM | POA: Diagnosis not present

## 2015-10-26 DIAGNOSIS — Z951 Presence of aortocoronary bypass graft: Secondary | ICD-10-CM | POA: Diagnosis not present

## 2015-10-26 DIAGNOSIS — I5033 Acute on chronic diastolic (congestive) heart failure: Secondary | ICD-10-CM | POA: Diagnosis not present

## 2015-10-26 DIAGNOSIS — Z7984 Long term (current) use of oral hypoglycemic drugs: Secondary | ICD-10-CM | POA: Diagnosis not present

## 2015-10-26 DIAGNOSIS — I110 Hypertensive heart disease with heart failure: Secondary | ICD-10-CM | POA: Diagnosis not present

## 2015-10-26 DIAGNOSIS — J440 Chronic obstructive pulmonary disease with acute lower respiratory infection: Secondary | ICD-10-CM | POA: Diagnosis not present

## 2015-10-26 DIAGNOSIS — Z87891 Personal history of nicotine dependence: Secondary | ICD-10-CM | POA: Diagnosis not present

## 2015-10-26 DIAGNOSIS — I2510 Atherosclerotic heart disease of native coronary artery without angina pectoris: Secondary | ICD-10-CM | POA: Diagnosis not present

## 2015-10-26 DIAGNOSIS — Z792 Long term (current) use of antibiotics: Secondary | ICD-10-CM | POA: Diagnosis not present

## 2015-10-26 DIAGNOSIS — Z7982 Long term (current) use of aspirin: Secondary | ICD-10-CM | POA: Diagnosis not present

## 2015-10-26 DIAGNOSIS — E1122 Type 2 diabetes mellitus with diabetic chronic kidney disease: Secondary | ICD-10-CM | POA: Diagnosis not present

## 2015-10-26 NOTE — Telephone Encounter (Signed)
Marchelle Folksmanda @ [facility] Pt will be discharged from nursing

## 2015-10-27 ENCOUNTER — Ambulatory Visit (INDEPENDENT_AMBULATORY_CARE_PROVIDER_SITE_OTHER)
Admission: RE | Admit: 2015-10-27 | Discharge: 2015-10-27 | Disposition: A | Discharge status: Home / Self Care | Payer: Medicare Other | Source: Ambulatory Visit | Attending: Family Medicine | Admitting: Family Medicine

## 2015-10-27 ENCOUNTER — Telehealth: Payer: Self-pay | Admitting: *Deleted

## 2015-10-27 ENCOUNTER — Ambulatory Visit (INDEPENDENT_AMBULATORY_CARE_PROVIDER_SITE_OTHER): Payer: Medicare Other | Admitting: Family Medicine

## 2015-10-27 ENCOUNTER — Encounter: Payer: Self-pay | Admitting: Family Medicine

## 2015-10-27 VITALS — BP 110/52 | HR 67 | Temp 97.7°F | Ht 67.5 in | Wt 182.0 lb

## 2015-10-27 DIAGNOSIS — J189 Pneumonia, unspecified organism: Secondary | ICD-10-CM | POA: Diagnosis not present

## 2015-10-27 DIAGNOSIS — R05 Cough: Secondary | ICD-10-CM | POA: Diagnosis not present

## 2015-10-27 DIAGNOSIS — R059 Cough, unspecified: Secondary | ICD-10-CM

## 2015-10-27 MED ORDER — AMOXICILLIN-POT CLAVULANATE 875-125 MG PO TABS: 1.0000 | ORAL_TABLET | Freq: Two times a day (BID) | ORAL | Status: DC

## 2015-10-27 NOTE — Telephone Encounter (Signed)
Mrs. Timothy Cordova notified as instructed by telephone.  Rx for Augmentin sent into [facility] as instructed by Dr. Patsy Lageropland.

## 2015-10-27 NOTE — Telephone Encounter (Signed)
-----   Message from Hannah BeatSpencer Copland, MD sent at 10/27/2015  1:05 PM EDT ----- Call  Xray is looking better but possible persistent pneumonia on R lung - I think we should place on antibiotics with recent hospitalization and overall picture.   Be sure to follow-up with pulmonary next week.  Augmentin 875 mg, 1 po bid, #20, 0 ref

## 2015-10-27 NOTE — Progress Notes (Signed)
Pre visit review using our clinic review tool, if applicable. No additional management support is needed unless otherwise documented below in the visit note. 

## 2015-10-27 NOTE — Progress Notes (Signed)
Dr. Karleen Cordova T. Timothy Mcgruder, MD, CAQ Sports Medicine Primary Care and Sports Medicine [address] [place], [address] Phone: [phone] Fax: [phone]  10/27/2015  Patient: Timothy Cordova, MRN: [id number], DOB: 04/24/1928, 82 y.o.  Primary Physician:  Timothy Beat, MD   Chief Complaint  Patient presents with  . Follow-up   Subjective:   Timothy Cordova is a 82 y.o. very pleasant male patient who presents with the following:  Concerned about his R lung - getting some yellow phlegm.  Now walking 10-15 minutes at a time.   Wt Readings from Last 3 Encounters:  10/27/15 182 lb (82.555 kg)  10/13/15 186 lb 8 oz (84.596 kg)  10/06/15 180 lb 8.9 oz (81.9 kg)     Ideal body weight: 67.249 kg (148 lb 4.1 oz) Adjusted ideal body weight: 73.372 kg (161 lb 12.1 oz)   Well-known gentleman with emphysema, coronary disease who recently was hospitalized with community-acquired pneumonia, respiratory failure, and he generally is doing better compared to around the time of this hospitalization.  I last saw him 2 weeks ago, now he has increasing cough with production of yellowish sputum.  His shortness of breath is actually better than it has been in the last year.  Past Medical History, Surgical History, Social History, Family History, Problem List, Medications, and Allergies have been reviewed and updated if relevant.  Patient Active Problem List   Diagnosis Date Noted  . CKD (chronic kidney disease) stage 3, GFR 30-59 ml/min 08/18/2010    Priority: High  . DIASTOLIC HEART FAILURE, CHRONIC 08/18/2009    Priority: High  . CAD, AUTOLOGOUS BYPASS GRAFT 06/07/2008    Priority: High  . DM (diabetes mellitus), type 2 with renal complications (HCC) 09/04/2007    Priority: High  . Centrilobular emphysema (HCC) 08/08/2005    Priority: High  . Acute on chronic diastolic heart failure (HCC) 10/08/2015  . Chronic obstructive pulmonary disease (HCC)   . CAP (community acquired  pneumonia) 10/06/2015  . Pneumonia involving right lung 10/06/2015  . Anemia 11/26/2014  . Aortic calcification, 04/20/2005 CT Chest 04/03/2013  . Sleep apnea   . Tobacco abuse (in remission)   . Chronic low back pain 04/18/2011  . Renal lesion 03/27/2011  . OSTEOARTHRITIS 12/06/2009  . Mixed hyperlipidemia 06/11/2008  . ALLERGIC RHINITIS 01/30/2007  . DEMENTIA 07/06/2006  . HEARING LOSS 07/06/2006  . Essential hypertension 07/06/2006  . BENIGN PROSTATIC HYPERTROPHY 07/06/2006    Past Medical History  Diagnosis Date  . Coronary artery disease     s/p CABG 2006. LHC (1/10): SVG-OM and PLOM, SVG-D, and LIMA-LAD were all patent  . Hypertension   . COPD (chronic obstructive pulmonary disease) (HCC)     moderate. followed by Dr.Byrum  . Chronic diastolic heart failure (HCC)     8/08 ECHO with EF 55%  . Edema     localized. suspect diastolic CHF + venous insufficiency  . Mixed hyperlipidemia   . Sleep apnea     ?  . Diabetes mellitus   . History of tobacco abuse   . Allergic rhinitis   . Hypercholesterolemia   . Benign prostatic hypertrophy   . Osteoarthritis   . Dementia     (mild)--06/2006  . Hearing loss     left >>right  . CKD (chronic kidney disease)     Past Surgical History  Procedure Laterality Date  . Angioplasty  [id number]  . Rotator cuff repair  2005    left, Timothy Cordova  .  Coronary artery bypass graft  02/2005  . Cardiac catheterization  90s    [place] x1stent  . Cardiac catheterization      [facility];x2 stents  . Cardiac catheterization  04/29/2013    Social History   Social History  . Marital Status: Married    Spouse Name: N/A  . Number of Children: N/A  . Years of Education: N/A   Occupational History  . rubber fabrication     RETIRED   Social History Main Topics  . Smoking status: Former Smoker -- 3.00 packs/day for 60 years    Types: Cigarettes    Quit date: 07/12/2005  . Smokeless tobacco: Never Used  . Alcohol Use: No  . Drug Use: No  .  Sexual Activity: Not on file   Other Topics Concern  . Not on file   Social History Narrative    Family History  Problem Relation Age of Onset  . Heart attack Mother   . Lung cancer Sister     Allergies  Allergen Reactions  . Metformin And Related   . Tramadol Hcl     Tremor vs seizure activity  . Ace Inhibitors     REACTION: cough  . Atorvastatin     REACTION: weakness  . Codeine Nausea And Vomiting  . Morphine Sulfate     REACTION: unspecified  . Ticlopidine Hcl     REACTION: unspecified  . Warfarin Sodium Other (See Comments)    Nausea and vomiting     Medication list reviewed and updated in full in [facility].   GEN: No acute illnesses, no fevers, chills. GI: No n/v/d, eating normally Pulm: No SOB Interactive and getting along well at home.  Otherwise, ROS is as per the HPI.  Objective:   BP 110/52 mmHg  Pulse 67  Temp(Src) 97.7 F (36.5 C) (Oral)  Ht 5' 7.5" (1.715 m)  Wt 182 lb (82.555 kg)  BMI 28.07 kg/m2  SpO2 92%  GEN: WDWN, NAD, Non-toxic, A & O x 3 HEENT: Atraumatic, Normocephalic. Neck supple. No masses, No LAD. Ears and Nose: No external deformity. CV: RRR, No M/G/R. No JVD. No thrill. No extra heart sounds. PULM: intermittent rhonchorous sounds throughout, worse on the right.  Intermittent wheezing.  This is improved compared to prior examinations. EXTR: No c/c/e NEURO Normal gait.  PSYCH: Normally interactive. Conversant. Not depressed or anxious appearing.  Calm demeanor.   Laboratory and Imaging Data: Dg Chest 2 View  10/27/2015  CLINICAL DATA:  82 year old male with a history of ongoing cough. Prior right pneumonia 10/06/2015 on chest x-ray and CT EXAM: CHEST  2 VIEW COMPARISON:  10/06/2015 FINDINGS: Cardiomediastinal silhouette unchanged in size and contour. Surgical changes of prior median sternotomy and CABG. Improving aeration on the right, with persistent reticular nodular opacity in the right suprahilar lung. No pleural  effusion. No pneumothorax. Calcifications of the aortic arch. Degenerative changes of the spine. IMPRESSION: Improving right-sided lung aeration, compatible with resolving pneumonia. There is bowel persistence in the right suprahilar region. Repeat plain film at an interval of 4 - 6 weeks may be useful to assure complete resolution. Aortic atherosclerosis Signed, Yvone NeuJaime S. Loreta AveWagner, DO [facility] Electronically Signed   By: Gilmer MorJaime  Wagner D.O.   On: 10/27/2015 10:29    Assessment and Plan:   Cough - Plan: DG Chest 2 View  CAP (community acquired pneumonia) - Plan: DG Chest 2 View  Given overall picture, question of resolving infiltrate on  x-ray and fragility of patient within the last couple of months, think that it is prudent to place him on antibiotics with concern for potentially incompletely resolved pneumonia.  Compliant with inhalers, overall, COPD seems to be improved compared to recent status.  Follow-up: 3 mo  New Prescriptions   AMOXICILLIN-CLAVULANATE (AUGMENTIN) 875-125 MG TABLET    Take 1 tablet by mouth 2 (two) times daily.   Orders Placed This Encounter  Procedures  . DG Chest 2 View    Signed,  Timothy Cordova T. Bashir Marchetti, MD   Patient's Medications  New Prescriptions   AMOXICILLIN-CLAVULANATE (AUGMENTIN) 875-125 MG TABLET    Take 1 tablet by mouth 2 (two) times daily.  Previous Medications   ACETAMINOPHEN (TYLENOL) 325 MG TABLET    Take 650 mg by mouth at bedtime.   ALBUTEROL (PROVENTIL HFA;VENTOLIN HFA) 108 (90 BASE) MCG/ACT INHALER    Inhale 2 puffs into the lungs every 6 (six) hours as needed for wheezing or shortness of breath.   ATORVASTATIN (LIPITOR) 80 MG TABLET    Take 40 mg by mouth daily.   CHOLECALCIFEROL (VITAMIN D-3 PO)    Take 1,000 Units by mouth daily.    DOXAZOSIN (CARDURA) 4 MG TABLET    TAKE 1 TO 2 TABLETS BY MOUTH EVERY DAY AS DIRECTED.   FUROSEMIDE (LASIX) 20 MG TABLET    Take 20 mg by mouth daily  as needed for fluid.    GLIPIZIDE XL 10 MG 24 HR TABLET    TAKE TWO (2) TABLETS BY MOUTH DAILY   ISOSORBIDE MONONITRATE (IMDUR) 30 MG 24 HR TABLET    Take 1 tablet (30 mg total) by mouth daily.   METOPROLOL SUCCINATE (TOPROL-XL) 50 MG 24 HR TABLET    Take 25 mg by mouth daily. Take with or immediately following a meal.   MULTIPLE VITAMINS-MINERALS (PRESERVISION AREDS PO)    Take 1 tablet by mouth daily.    ONE TOUCH ULTRA TEST TEST STRIP    USE TO CHECK BLOOD SUGAR 2 TIMES DAILY AS DIRECTED   ONETOUCH DELICA LANCETS 33G MISC       TIOTROPIUM BROMIDE-OLODATEROL (STIOLTO RESPIMAT) 2.5-2.5 MCG/ACT AERS    Inhale 2 puffs into the lungs every morning.   Modified Medications   No medications on file  Discontinued Medications   AMOXICILLIN-CLAVULANATE (AUGMENTIN) 875-125 MG TABLET    Take 1 tablet by mouth 2 (two) times daily.   ASPIRIN 81 MG EC TABLET    Take 81 mg by mouth every morning.    PREDNISONE (DELTASONE) 20 MG TABLET    Take 2 tablets (40 mg total) by mouth daily with breakfast.

## 2015-10-28 ENCOUNTER — Telehealth: Payer: Self-pay

## 2015-10-28 NOTE — Telephone Encounter (Signed)
agree

## 2015-10-28 NOTE — Telephone Encounter (Signed)
[name] with [facility] left v/m requesting verbal order to discharge pt from home health next week.Please advise.

## 2015-10-29 ENCOUNTER — Telehealth: Payer: Self-pay

## 2015-10-29 DIAGNOSIS — J440 Chronic obstructive pulmonary disease with acute lower respiratory infection: Secondary | ICD-10-CM | POA: Diagnosis not present

## 2015-10-29 DIAGNOSIS — I2510 Atherosclerotic heart disease of native coronary artery without angina pectoris: Secondary | ICD-10-CM | POA: Diagnosis not present

## 2015-10-29 DIAGNOSIS — I5033 Acute on chronic diastolic (congestive) heart failure: Secondary | ICD-10-CM | POA: Diagnosis not present

## 2015-10-29 DIAGNOSIS — I110 Hypertensive heart disease with heart failure: Secondary | ICD-10-CM | POA: Diagnosis not present

## 2015-10-29 DIAGNOSIS — J189 Pneumonia, unspecified organism: Secondary | ICD-10-CM | POA: Diagnosis not present

## 2015-10-29 DIAGNOSIS — E1122 Type 2 diabetes mellitus with diabetic chronic kidney disease: Secondary | ICD-10-CM | POA: Diagnosis not present

## 2015-10-29 DIAGNOSIS — N183 Chronic kidney disease, stage 3 (moderate): Secondary | ICD-10-CM | POA: Diagnosis not present

## 2015-10-29 DIAGNOSIS — Z7982 Long term (current) use of aspirin: Secondary | ICD-10-CM | POA: Diagnosis not present

## 2015-10-29 DIAGNOSIS — Z87891 Personal history of nicotine dependence: Secondary | ICD-10-CM | POA: Diagnosis not present

## 2015-10-29 DIAGNOSIS — Z792 Long term (current) use of antibiotics: Secondary | ICD-10-CM | POA: Diagnosis not present

## 2015-10-29 DIAGNOSIS — Z7984 Long term (current) use of oral hypoglycemic drugs: Secondary | ICD-10-CM | POA: Diagnosis not present

## 2015-10-29 DIAGNOSIS — Z951 Presence of aortocoronary bypass graft: Secondary | ICD-10-CM | POA: Diagnosis not present

## 2015-10-29 NOTE — Telephone Encounter (Signed)
Junious Dresseronnie OT with [facility] left v/m requesting verbal orders for home health OT 2 x a week for 4 weeks.

## 2015-10-29 NOTE — Telephone Encounter (Signed)
Left message for Junious DresserConnie with [facility] giving verbal order for home health OT 2 x a week for 4 weeks per Dr. Patsy Lageropland.

## 2015-10-29 NOTE — Telephone Encounter (Signed)
Spoke to [place] and provided verbal order

## 2015-10-29 NOTE — Telephone Encounter (Signed)
agree

## 2015-11-02 ENCOUNTER — Ambulatory Visit (INDEPENDENT_AMBULATORY_CARE_PROVIDER_SITE_OTHER): Payer: Medicare Other | Admitting: Pulmonary Disease

## 2015-11-02 ENCOUNTER — Encounter: Payer: Self-pay | Admitting: Pulmonary Disease

## 2015-11-02 VITALS — BP 134/68 | HR 64 | Ht 67.5 in | Wt 184.6 lb

## 2015-11-02 DIAGNOSIS — E1122 Type 2 diabetes mellitus with diabetic chronic kidney disease: Secondary | ICD-10-CM | POA: Diagnosis not present

## 2015-11-02 DIAGNOSIS — I5033 Acute on chronic diastolic (congestive) heart failure: Secondary | ICD-10-CM | POA: Diagnosis not present

## 2015-11-02 DIAGNOSIS — J440 Chronic obstructive pulmonary disease with acute lower respiratory infection: Secondary | ICD-10-CM | POA: Diagnosis not present

## 2015-11-02 DIAGNOSIS — J189 Pneumonia, unspecified organism: Secondary | ICD-10-CM | POA: Diagnosis not present

## 2015-11-02 DIAGNOSIS — J181 Lobar pneumonia, unspecified organism: Principal | ICD-10-CM

## 2015-11-02 DIAGNOSIS — Z7984 Long term (current) use of oral hypoglycemic drugs: Secondary | ICD-10-CM | POA: Diagnosis not present

## 2015-11-02 DIAGNOSIS — I110 Hypertensive heart disease with heart failure: Secondary | ICD-10-CM | POA: Diagnosis not present

## 2015-11-02 DIAGNOSIS — J432 Centrilobular emphysema: Secondary | ICD-10-CM

## 2015-11-02 DIAGNOSIS — Z7982 Long term (current) use of aspirin: Secondary | ICD-10-CM | POA: Diagnosis not present

## 2015-11-02 DIAGNOSIS — I2510 Atherosclerotic heart disease of native coronary artery without angina pectoris: Secondary | ICD-10-CM | POA: Diagnosis not present

## 2015-11-02 DIAGNOSIS — Z792 Long term (current) use of antibiotics: Secondary | ICD-10-CM | POA: Diagnosis not present

## 2015-11-02 DIAGNOSIS — Z87891 Personal history of nicotine dependence: Secondary | ICD-10-CM | POA: Diagnosis not present

## 2015-11-02 DIAGNOSIS — Z951 Presence of aortocoronary bypass graft: Secondary | ICD-10-CM | POA: Diagnosis not present

## 2015-11-02 DIAGNOSIS — N183 Chronic kidney disease, stage 3 (moderate): Secondary | ICD-10-CM | POA: Diagnosis not present

## 2015-11-02 NOTE — Patient Instructions (Signed)
1.  Continue taking Stiolto as prescribed 2.  A repeat CT scan has been ordered to ensure resolution of right-sided pneumonia and no underlying mass. 3.  Follow up with Dr. Kendrick Fries post CT scan for review 4.  Please call for new or worsening symptoms 5.  Add ensure to assist with nutrition and weight gain given recent weight loss

## 2015-11-02 NOTE — Progress Notes (Signed)
[facility]   Chief Complaint  Patient presents with  . Follow-up    pt recently hospitalized for CAP.  Pt c/o sob with exertion.  Denies mucus production, cough.       Current Outpatient Prescriptions on File Prior to Visit  Medication Sig  . acetaminophen (TYLENOL) 325 MG tablet Take 650 mg by mouth at bedtime.  [name] albuterol (PROVENTIL HFA;VENTOLIN HFA) 108 (90 Base) MCG/ACT inhaler Inhale 2 puffs into the lungs every 6 (six) hours as needed for wheezing or shortness of breath.  [name] amoxicillin-clavulanate (AUGMENTIN) 875-125 MG tablet Take 1 tablet by mouth 2 (two) times daily.  [name] atorvastatin (LIPITOR) 80 MG tablet Take 40 mg by mouth daily.  . Cholecalciferol (VITAMIN D-3 PO) Take 1,000 Units by mouth daily.   [name] doxazosin (CARDURA) 4 MG tablet TAKE 1 TO 2 TABLETS BY MOUTH EVERY DAY AS DIRECTED. (Patient taking differently: TAKE 1 TABLETS BY MOUTH EVERY evening)  . furosemide (LASIX) 20 MG tablet Take 20 mg by mouth daily as needed for fluid.   [name] GLIPIZIDE XL 10 MG 24 hr tablet TAKE TWO (2) TABLETS BY MOUTH DAILY (Patient taking differently: TAKE One (1) TABLET (10 mg) BY MOUTH twice a day)  . isosorbide mononitrate (IMDUR) 30 MG 24 hr tablet Take 1 tablet (30 mg total) by mouth daily.  . metoprolol succinate (TOPROL-XL) 50 MG 24 hr tablet Take 25 mg by mouth daily. Take with or immediately following a meal.  . Multiple Vitamins-Minerals (PRESERVISION AREDS PO) Take 1 tablet by mouth daily.   . ONE TOUCH ULTRA TEST test strip USE TO CHECK BLOOD SUGAR 2 TIMES DAILY AS DIRECTED  . ONETOUCH DELICA LANCETS 33G MISC   . Tiotropium Bromide-Olodaterol (STIOLTO RESPIMAT) 2.5-2.5 MCG/ACT AERS Inhale 2 puffs into the lungs every morning.   . [DISCONTINUED] gabapentin (NEURONTIN) 300 MG capsule Take 300 mg by mouth 2 (two) times daily.     No current facility-administered medications on file prior to visit.      Studies: 7/19  CXR >> improved right-sided aeration, persistent to  reticulo-nodular opacity in the right suprahilar region  Past Medical Hx:  has a past medical history of Allergic rhinitis; Benign prostatic hypertrophy; Chronic diastolic heart failure (HCC); CKD (chronic kidney disease); COPD (chronic obstructive pulmonary disease) (HCC); Coronary artery disease; Dementia; Diabetes mellitus; Edema; Hearing loss; History of tobacco abuse; Hypercholesterolemia; Hypertension; Mixed hyperlipidemia; Osteoarthritis; and Sleep apnea.   Past Surgical hx, Allergies, Family hx, Social hx all reviewed.  Vital Signs BP 134/68 (BP Location: Left Arm, Cuff Size: Normal)   Pulse 64   Ht 5' 7.5" (1.715 m)   Wt 184 lb 9.6 oz (83.7 kg)   SpO2 96%   BMI 28.49 kg/m   History of Present Illness Timothy Cordova is a 82 y.o. male, former smoker (smoked 65 years, 3ppd, quit Feb 26, 2005) with a history of COPD followed by Dr. Kendrick Fries, recent hospitalization from 6/28-7/14 w for right pneumonia who presented to the pulmonary office for hospital follow-up.    Hospital course notable for acute hypoxic respiratory failure in the setting of continued acquired pneumonia. He was treated with ceftriaxone and azithromycin. The patient was also noted to have approximately 20 pound weight loss in the last month prior to admission. He also had been experiencing intermittent fevers and progressive fatigue. PCCM evaluated the patient while in the hospital and recommended 10 days therapy of antibiotics and follow-up CT of the chest in 3 months to review concerns for  possible underlying mass.  The course of admission he also was treated for acute on chronic diastolic heart failure. Blood cultures were negative.  The patient warts he feels much better now than he has in the past 2-3 years. He reports that over the last 2-3 years he feels as though his breathing has declined. After hospitalization he reports his appetite has returned and he is slowly gaining weight again. He reports he has chronic  lower extremity swelling for which he wears compression stockings and takes as needed Lasix and this is unchanged. At baseline he reports he can walk approximately half mile on a good day. Prior to previous admission, he could not walk 500 feet to the mailbox. He reports he had to put a chair in the woods to take a rest break. He has been checking his oxygen saturations at home and has been running 90% and above.  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  He reports he didn't use known cancer causing agents.  He also worked around asbestos 30 years.  Physical Exam  General - well developed, well dressed adult M in no acute distress ENT - No sinus tenderness, no oral exudate, no LAN Cardiac - s1s2 regular, no murmur Chest - even/non-labored, lungs bilaterally clear. No wheeze/rales Back - No focal tenderness Abd - Soft, non-tender Ext - No edema Neuro - Normal strength Skin - No rashes Psych - normal mood, and behavior   Assessment/Plan  Resolving Right Community Acquired PNA - 7/19 repeat chest x-ray with improved aeration and residual right hilar reticulo-nodular opacity  Plan: Repeat CT of the chest at the beginning of September Follow up with Dr. Kendrick Fries after CT has been completed  COPD with Centrolobular Empysema - not on oxygen at baseline, spirometry 11/2014 FVC 1.87 / 49% predicted, FEV1 / FVC 57%  Plan: Continue Stiolto Continue PRN albuterol for wheezing / SOB   Former Tobacco Abuse - quit in 2006, prior chemical exposure as well as asbestosis (see above)  Plan: Repeat CT imagine as above to rule out underlying malignancy    Weight Loss - improved per pt, rule out malignancy related weight loss  Plan: Add ensure to promote weight gain   Patient Instructions  1.  Continue taking Stiolto as prescribed 2.  A repeat CT scan has been ordered to ensure resolution of right-sided pneumonia and no underlying mass. 3.   Follow up with Dr. Kendrick Fries post CT scan for review 4.  Please call for new or worsening symptoms 5.  Add ensure to assist with nutrition and weight gain given recent weight loss     Canary Brim, NP-C [facility]  [phone] 11/02/2015, 4:21 PM  Attending:  I have seen and examined the patient with Canary Brim and I agree with the findings from her note  Mr. Jaggi was hospitalized for community-acquired pneumonia in June of this year. He said that he had the subacute onset of symptoms prior to this with a steady increase in dyspnea and cough over several weeks to months. He feels that he had "pneumonia 4 most of the year". After treatment with IV antibiotics during his hospitalization he has had complete resolution and he now feels back to baseline.  On exam: Lungs clear to auscultation bilaterally Cardiovascular no murmurs gallops or rubs  belly soft Good spirits   Chest x-ray images personally reviewed showing no evidence of pulmonary infiltrate  CT from June  2017 images personally reviewed showing a dense infiltrate in the right lung with questionable masslike consolidation.  COPD: Continue Stiolto Obstructive sleep apnea: Continue CPAP Abnormal chest x-ray/community aquired pneumonia: This seems to have resolved clinically but he needs to have a repeat CT chest to ensure that the masslike consolidation has resolved. We will arrange for this in September of this year.  Heber [place], MD [facility] Pager: [phone] Cell: [phone] After 3pm or if no response, call [phone]

## 2015-11-03 DIAGNOSIS — I2510 Atherosclerotic heart disease of native coronary artery without angina pectoris: Secondary | ICD-10-CM | POA: Diagnosis not present

## 2015-11-03 DIAGNOSIS — J189 Pneumonia, unspecified organism: Secondary | ICD-10-CM | POA: Diagnosis not present

## 2015-11-03 DIAGNOSIS — E1122 Type 2 diabetes mellitus with diabetic chronic kidney disease: Secondary | ICD-10-CM | POA: Diagnosis not present

## 2015-11-03 DIAGNOSIS — Z87891 Personal history of nicotine dependence: Secondary | ICD-10-CM | POA: Diagnosis not present

## 2015-11-03 DIAGNOSIS — I5033 Acute on chronic diastolic (congestive) heart failure: Secondary | ICD-10-CM | POA: Diagnosis not present

## 2015-11-03 DIAGNOSIS — Z792 Long term (current) use of antibiotics: Secondary | ICD-10-CM | POA: Diagnosis not present

## 2015-11-03 DIAGNOSIS — Z7982 Long term (current) use of aspirin: Secondary | ICD-10-CM | POA: Diagnosis not present

## 2015-11-03 DIAGNOSIS — J440 Chronic obstructive pulmonary disease with acute lower respiratory infection: Secondary | ICD-10-CM | POA: Diagnosis not present

## 2015-11-03 DIAGNOSIS — Z951 Presence of aortocoronary bypass graft: Secondary | ICD-10-CM | POA: Diagnosis not present

## 2015-11-03 DIAGNOSIS — N183 Chronic kidney disease, stage 3 (moderate): Secondary | ICD-10-CM | POA: Diagnosis not present

## 2015-11-03 DIAGNOSIS — Z7984 Long term (current) use of oral hypoglycemic drugs: Secondary | ICD-10-CM | POA: Diagnosis not present

## 2015-11-03 DIAGNOSIS — I110 Hypertensive heart disease with heart failure: Secondary | ICD-10-CM | POA: Diagnosis not present

## 2015-11-04 DIAGNOSIS — Z87891 Personal history of nicotine dependence: Secondary | ICD-10-CM | POA: Diagnosis not present

## 2015-11-04 DIAGNOSIS — I5033 Acute on chronic diastolic (congestive) heart failure: Secondary | ICD-10-CM | POA: Diagnosis not present

## 2015-11-04 DIAGNOSIS — Z7982 Long term (current) use of aspirin: Secondary | ICD-10-CM | POA: Diagnosis not present

## 2015-11-04 DIAGNOSIS — I110 Hypertensive heart disease with heart failure: Secondary | ICD-10-CM | POA: Diagnosis not present

## 2015-11-04 DIAGNOSIS — J189 Pneumonia, unspecified organism: Secondary | ICD-10-CM | POA: Diagnosis not present

## 2015-11-04 DIAGNOSIS — E1122 Type 2 diabetes mellitus with diabetic chronic kidney disease: Secondary | ICD-10-CM | POA: Diagnosis not present

## 2015-11-04 DIAGNOSIS — I2510 Atherosclerotic heart disease of native coronary artery without angina pectoris: Secondary | ICD-10-CM | POA: Diagnosis not present

## 2015-11-04 DIAGNOSIS — Z7984 Long term (current) use of oral hypoglycemic drugs: Secondary | ICD-10-CM | POA: Diagnosis not present

## 2015-11-04 DIAGNOSIS — Z951 Presence of aortocoronary bypass graft: Secondary | ICD-10-CM | POA: Diagnosis not present

## 2015-11-04 DIAGNOSIS — Z792 Long term (current) use of antibiotics: Secondary | ICD-10-CM | POA: Diagnosis not present

## 2015-11-04 DIAGNOSIS — N183 Chronic kidney disease, stage 3 (moderate): Secondary | ICD-10-CM | POA: Diagnosis not present

## 2015-11-04 DIAGNOSIS — J440 Chronic obstructive pulmonary disease with acute lower respiratory infection: Secondary | ICD-10-CM | POA: Diagnosis not present

## 2015-11-05 DIAGNOSIS — Z7982 Long term (current) use of aspirin: Secondary | ICD-10-CM | POA: Diagnosis not present

## 2015-11-05 DIAGNOSIS — Z87891 Personal history of nicotine dependence: Secondary | ICD-10-CM | POA: Diagnosis not present

## 2015-11-05 DIAGNOSIS — Z792 Long term (current) use of antibiotics: Secondary | ICD-10-CM | POA: Diagnosis not present

## 2015-11-05 DIAGNOSIS — J440 Chronic obstructive pulmonary disease with acute lower respiratory infection: Secondary | ICD-10-CM | POA: Diagnosis not present

## 2015-11-05 DIAGNOSIS — N183 Chronic kidney disease, stage 3 (moderate): Secondary | ICD-10-CM | POA: Diagnosis not present

## 2015-11-05 DIAGNOSIS — I110 Hypertensive heart disease with heart failure: Secondary | ICD-10-CM | POA: Diagnosis not present

## 2015-11-05 DIAGNOSIS — Z7984 Long term (current) use of oral hypoglycemic drugs: Secondary | ICD-10-CM | POA: Diagnosis not present

## 2015-11-05 DIAGNOSIS — I2510 Atherosclerotic heart disease of native coronary artery without angina pectoris: Secondary | ICD-10-CM | POA: Diagnosis not present

## 2015-11-05 DIAGNOSIS — I5033 Acute on chronic diastolic (congestive) heart failure: Secondary | ICD-10-CM | POA: Diagnosis not present

## 2015-11-05 DIAGNOSIS — Z951 Presence of aortocoronary bypass graft: Secondary | ICD-10-CM | POA: Diagnosis not present

## 2015-11-05 DIAGNOSIS — E1122 Type 2 diabetes mellitus with diabetic chronic kidney disease: Secondary | ICD-10-CM | POA: Diagnosis not present

## 2015-11-05 DIAGNOSIS — J189 Pneumonia, unspecified organism: Secondary | ICD-10-CM | POA: Diagnosis not present

## 2015-11-09 DIAGNOSIS — Z951 Presence of aortocoronary bypass graft: Secondary | ICD-10-CM | POA: Diagnosis not present

## 2015-11-09 DIAGNOSIS — I110 Hypertensive heart disease with heart failure: Secondary | ICD-10-CM | POA: Diagnosis not present

## 2015-11-09 DIAGNOSIS — I2510 Atherosclerotic heart disease of native coronary artery without angina pectoris: Secondary | ICD-10-CM | POA: Diagnosis not present

## 2015-11-09 DIAGNOSIS — Z87891 Personal history of nicotine dependence: Secondary | ICD-10-CM | POA: Diagnosis not present

## 2015-11-09 DIAGNOSIS — J440 Chronic obstructive pulmonary disease with acute lower respiratory infection: Secondary | ICD-10-CM | POA: Diagnosis not present

## 2015-11-09 DIAGNOSIS — J189 Pneumonia, unspecified organism: Secondary | ICD-10-CM | POA: Diagnosis not present

## 2015-11-09 DIAGNOSIS — Z7984 Long term (current) use of oral hypoglycemic drugs: Secondary | ICD-10-CM | POA: Diagnosis not present

## 2015-11-09 DIAGNOSIS — I5033 Acute on chronic diastolic (congestive) heart failure: Secondary | ICD-10-CM | POA: Diagnosis not present

## 2015-11-09 DIAGNOSIS — E1122 Type 2 diabetes mellitus with diabetic chronic kidney disease: Secondary | ICD-10-CM | POA: Diagnosis not present

## 2015-11-09 DIAGNOSIS — Z792 Long term (current) use of antibiotics: Secondary | ICD-10-CM | POA: Diagnosis not present

## 2015-11-09 DIAGNOSIS — Z7982 Long term (current) use of aspirin: Secondary | ICD-10-CM | POA: Diagnosis not present

## 2015-11-09 DIAGNOSIS — N183 Chronic kidney disease, stage 3 (moderate): Secondary | ICD-10-CM | POA: Diagnosis not present

## 2015-11-10 DIAGNOSIS — J440 Chronic obstructive pulmonary disease with acute lower respiratory infection: Secondary | ICD-10-CM | POA: Diagnosis not present

## 2015-11-10 DIAGNOSIS — N183 Chronic kidney disease, stage 3 (moderate): Secondary | ICD-10-CM | POA: Diagnosis not present

## 2015-11-10 DIAGNOSIS — I5033 Acute on chronic diastolic (congestive) heart failure: Secondary | ICD-10-CM | POA: Diagnosis not present

## 2015-11-10 DIAGNOSIS — Z7982 Long term (current) use of aspirin: Secondary | ICD-10-CM | POA: Diagnosis not present

## 2015-11-10 DIAGNOSIS — I110 Hypertensive heart disease with heart failure: Secondary | ICD-10-CM | POA: Diagnosis not present

## 2015-11-10 DIAGNOSIS — Z951 Presence of aortocoronary bypass graft: Secondary | ICD-10-CM | POA: Diagnosis not present

## 2015-11-10 DIAGNOSIS — Z87891 Personal history of nicotine dependence: Secondary | ICD-10-CM | POA: Diagnosis not present

## 2015-11-10 DIAGNOSIS — E1122 Type 2 diabetes mellitus with diabetic chronic kidney disease: Secondary | ICD-10-CM | POA: Diagnosis not present

## 2015-11-10 DIAGNOSIS — J189 Pneumonia, unspecified organism: Secondary | ICD-10-CM | POA: Diagnosis not present

## 2015-11-10 DIAGNOSIS — Z792 Long term (current) use of antibiotics: Secondary | ICD-10-CM | POA: Diagnosis not present

## 2015-11-10 DIAGNOSIS — I2510 Atherosclerotic heart disease of native coronary artery without angina pectoris: Secondary | ICD-10-CM | POA: Diagnosis not present

## 2015-11-10 DIAGNOSIS — Z7984 Long term (current) use of oral hypoglycemic drugs: Secondary | ICD-10-CM | POA: Diagnosis not present

## 2015-11-12 DIAGNOSIS — Z951 Presence of aortocoronary bypass graft: Secondary | ICD-10-CM | POA: Diagnosis not present

## 2015-11-12 DIAGNOSIS — Z87891 Personal history of nicotine dependence: Secondary | ICD-10-CM | POA: Diagnosis not present

## 2015-11-12 DIAGNOSIS — N183 Chronic kidney disease, stage 3 (moderate): Secondary | ICD-10-CM | POA: Diagnosis not present

## 2015-11-12 DIAGNOSIS — Z792 Long term (current) use of antibiotics: Secondary | ICD-10-CM | POA: Diagnosis not present

## 2015-11-12 DIAGNOSIS — I5033 Acute on chronic diastolic (congestive) heart failure: Secondary | ICD-10-CM | POA: Diagnosis not present

## 2015-11-12 DIAGNOSIS — I110 Hypertensive heart disease with heart failure: Secondary | ICD-10-CM | POA: Diagnosis not present

## 2015-11-12 DIAGNOSIS — E1122 Type 2 diabetes mellitus with diabetic chronic kidney disease: Secondary | ICD-10-CM | POA: Diagnosis not present

## 2015-11-12 DIAGNOSIS — Z7982 Long term (current) use of aspirin: Secondary | ICD-10-CM | POA: Diagnosis not present

## 2015-11-12 DIAGNOSIS — J440 Chronic obstructive pulmonary disease with acute lower respiratory infection: Secondary | ICD-10-CM | POA: Diagnosis not present

## 2015-11-12 DIAGNOSIS — I2510 Atherosclerotic heart disease of native coronary artery without angina pectoris: Secondary | ICD-10-CM | POA: Diagnosis not present

## 2015-11-12 DIAGNOSIS — Z7984 Long term (current) use of oral hypoglycemic drugs: Secondary | ICD-10-CM | POA: Diagnosis not present

## 2015-11-12 DIAGNOSIS — J189 Pneumonia, unspecified organism: Secondary | ICD-10-CM | POA: Diagnosis not present

## 2015-11-16 DIAGNOSIS — Z87891 Personal history of nicotine dependence: Secondary | ICD-10-CM | POA: Diagnosis not present

## 2015-11-16 DIAGNOSIS — N183 Chronic kidney disease, stage 3 (moderate): Secondary | ICD-10-CM | POA: Diagnosis not present

## 2015-11-16 DIAGNOSIS — E1122 Type 2 diabetes mellitus with diabetic chronic kidney disease: Secondary | ICD-10-CM | POA: Diagnosis not present

## 2015-11-16 DIAGNOSIS — I5033 Acute on chronic diastolic (congestive) heart failure: Secondary | ICD-10-CM | POA: Diagnosis not present

## 2015-11-16 DIAGNOSIS — I2510 Atherosclerotic heart disease of native coronary artery without angina pectoris: Secondary | ICD-10-CM | POA: Diagnosis not present

## 2015-11-16 DIAGNOSIS — Z951 Presence of aortocoronary bypass graft: Secondary | ICD-10-CM | POA: Diagnosis not present

## 2015-11-16 DIAGNOSIS — J189 Pneumonia, unspecified organism: Secondary | ICD-10-CM | POA: Diagnosis not present

## 2015-11-16 DIAGNOSIS — Z7984 Long term (current) use of oral hypoglycemic drugs: Secondary | ICD-10-CM | POA: Diagnosis not present

## 2015-11-16 DIAGNOSIS — I110 Hypertensive heart disease with heart failure: Secondary | ICD-10-CM | POA: Diagnosis not present

## 2015-11-16 DIAGNOSIS — J440 Chronic obstructive pulmonary disease with acute lower respiratory infection: Secondary | ICD-10-CM | POA: Diagnosis not present

## 2015-11-16 DIAGNOSIS — Z7982 Long term (current) use of aspirin: Secondary | ICD-10-CM | POA: Diagnosis not present

## 2015-11-16 DIAGNOSIS — Z792 Long term (current) use of antibiotics: Secondary | ICD-10-CM | POA: Diagnosis not present

## 2015-11-17 DIAGNOSIS — E1122 Type 2 diabetes mellitus with diabetic chronic kidney disease: Secondary | ICD-10-CM | POA: Diagnosis not present

## 2015-11-17 DIAGNOSIS — I2510 Atherosclerotic heart disease of native coronary artery without angina pectoris: Secondary | ICD-10-CM | POA: Diagnosis not present

## 2015-11-17 DIAGNOSIS — I5033 Acute on chronic diastolic (congestive) heart failure: Secondary | ICD-10-CM | POA: Diagnosis not present

## 2015-11-17 DIAGNOSIS — N183 Chronic kidney disease, stage 3 (moderate): Secondary | ICD-10-CM | POA: Diagnosis not present

## 2015-11-17 DIAGNOSIS — Z7984 Long term (current) use of oral hypoglycemic drugs: Secondary | ICD-10-CM | POA: Diagnosis not present

## 2015-11-17 DIAGNOSIS — I110 Hypertensive heart disease with heart failure: Secondary | ICD-10-CM | POA: Diagnosis not present

## 2015-11-17 DIAGNOSIS — Z7982 Long term (current) use of aspirin: Secondary | ICD-10-CM | POA: Diagnosis not present

## 2015-11-17 DIAGNOSIS — Z951 Presence of aortocoronary bypass graft: Secondary | ICD-10-CM | POA: Diagnosis not present

## 2015-11-17 DIAGNOSIS — Z87891 Personal history of nicotine dependence: Secondary | ICD-10-CM | POA: Diagnosis not present

## 2015-11-17 DIAGNOSIS — Z792 Long term (current) use of antibiotics: Secondary | ICD-10-CM | POA: Diagnosis not present

## 2015-11-17 DIAGNOSIS — J440 Chronic obstructive pulmonary disease with acute lower respiratory infection: Secondary | ICD-10-CM | POA: Diagnosis not present

## 2015-11-17 DIAGNOSIS — J189 Pneumonia, unspecified organism: Secondary | ICD-10-CM | POA: Diagnosis not present

## 2015-11-18 DIAGNOSIS — J440 Chronic obstructive pulmonary disease with acute lower respiratory infection: Secondary | ICD-10-CM | POA: Diagnosis not present

## 2015-11-18 DIAGNOSIS — J189 Pneumonia, unspecified organism: Secondary | ICD-10-CM | POA: Diagnosis not present

## 2015-11-18 DIAGNOSIS — Z7984 Long term (current) use of oral hypoglycemic drugs: Secondary | ICD-10-CM | POA: Diagnosis not present

## 2015-11-18 DIAGNOSIS — I5033 Acute on chronic diastolic (congestive) heart failure: Secondary | ICD-10-CM | POA: Diagnosis not present

## 2015-11-18 DIAGNOSIS — Z7982 Long term (current) use of aspirin: Secondary | ICD-10-CM | POA: Diagnosis not present

## 2015-11-18 DIAGNOSIS — Z87891 Personal history of nicotine dependence: Secondary | ICD-10-CM | POA: Diagnosis not present

## 2015-11-18 DIAGNOSIS — Z792 Long term (current) use of antibiotics: Secondary | ICD-10-CM | POA: Diagnosis not present

## 2015-11-18 DIAGNOSIS — I110 Hypertensive heart disease with heart failure: Secondary | ICD-10-CM | POA: Diagnosis not present

## 2015-11-18 DIAGNOSIS — I2510 Atherosclerotic heart disease of native coronary artery without angina pectoris: Secondary | ICD-10-CM | POA: Diagnosis not present

## 2015-11-18 DIAGNOSIS — N183 Chronic kidney disease, stage 3 (moderate): Secondary | ICD-10-CM | POA: Diagnosis not present

## 2015-11-18 DIAGNOSIS — E1122 Type 2 diabetes mellitus with diabetic chronic kidney disease: Secondary | ICD-10-CM | POA: Diagnosis not present

## 2015-11-18 DIAGNOSIS — Z951 Presence of aortocoronary bypass graft: Secondary | ICD-10-CM | POA: Diagnosis not present

## 2015-11-19 DIAGNOSIS — Z7982 Long term (current) use of aspirin: Secondary | ICD-10-CM | POA: Diagnosis not present

## 2015-11-19 DIAGNOSIS — I2510 Atherosclerotic heart disease of native coronary artery without angina pectoris: Secondary | ICD-10-CM | POA: Diagnosis not present

## 2015-11-19 DIAGNOSIS — Z87891 Personal history of nicotine dependence: Secondary | ICD-10-CM | POA: Diagnosis not present

## 2015-11-19 DIAGNOSIS — N183 Chronic kidney disease, stage 3 (moderate): Secondary | ICD-10-CM | POA: Diagnosis not present

## 2015-11-19 DIAGNOSIS — Z951 Presence of aortocoronary bypass graft: Secondary | ICD-10-CM | POA: Diagnosis not present

## 2015-11-19 DIAGNOSIS — Z7984 Long term (current) use of oral hypoglycemic drugs: Secondary | ICD-10-CM | POA: Diagnosis not present

## 2015-11-19 DIAGNOSIS — E1122 Type 2 diabetes mellitus with diabetic chronic kidney disease: Secondary | ICD-10-CM | POA: Diagnosis not present

## 2015-11-19 DIAGNOSIS — J189 Pneumonia, unspecified organism: Secondary | ICD-10-CM | POA: Diagnosis not present

## 2015-11-19 DIAGNOSIS — I5033 Acute on chronic diastolic (congestive) heart failure: Secondary | ICD-10-CM | POA: Diagnosis not present

## 2015-11-19 DIAGNOSIS — J440 Chronic obstructive pulmonary disease with acute lower respiratory infection: Secondary | ICD-10-CM | POA: Diagnosis not present

## 2015-11-19 DIAGNOSIS — I110 Hypertensive heart disease with heart failure: Secondary | ICD-10-CM | POA: Diagnosis not present

## 2015-11-19 DIAGNOSIS — Z792 Long term (current) use of antibiotics: Secondary | ICD-10-CM | POA: Diagnosis not present

## 2015-11-24 DIAGNOSIS — I2510 Atherosclerotic heart disease of native coronary artery without angina pectoris: Secondary | ICD-10-CM | POA: Diagnosis not present

## 2015-11-24 DIAGNOSIS — Z7982 Long term (current) use of aspirin: Secondary | ICD-10-CM | POA: Diagnosis not present

## 2015-11-24 DIAGNOSIS — Z951 Presence of aortocoronary bypass graft: Secondary | ICD-10-CM | POA: Diagnosis not present

## 2015-11-24 DIAGNOSIS — Z87891 Personal history of nicotine dependence: Secondary | ICD-10-CM | POA: Diagnosis not present

## 2015-11-24 DIAGNOSIS — J189 Pneumonia, unspecified organism: Secondary | ICD-10-CM | POA: Diagnosis not present

## 2015-11-24 DIAGNOSIS — E1122 Type 2 diabetes mellitus with diabetic chronic kidney disease: Secondary | ICD-10-CM | POA: Diagnosis not present

## 2015-11-24 DIAGNOSIS — Z792 Long term (current) use of antibiotics: Secondary | ICD-10-CM | POA: Diagnosis not present

## 2015-11-24 DIAGNOSIS — I5033 Acute on chronic diastolic (congestive) heart failure: Secondary | ICD-10-CM | POA: Diagnosis not present

## 2015-11-24 DIAGNOSIS — Z7984 Long term (current) use of oral hypoglycemic drugs: Secondary | ICD-10-CM | POA: Diagnosis not present

## 2015-11-24 DIAGNOSIS — I110 Hypertensive heart disease with heart failure: Secondary | ICD-10-CM | POA: Diagnosis not present

## 2015-11-24 DIAGNOSIS — J440 Chronic obstructive pulmonary disease with acute lower respiratory infection: Secondary | ICD-10-CM | POA: Diagnosis not present

## 2015-11-24 DIAGNOSIS — N183 Chronic kidney disease, stage 3 (moderate): Secondary | ICD-10-CM | POA: Diagnosis not present

## 2015-11-25 DIAGNOSIS — Z792 Long term (current) use of antibiotics: Secondary | ICD-10-CM | POA: Diagnosis not present

## 2015-11-25 DIAGNOSIS — J440 Chronic obstructive pulmonary disease with acute lower respiratory infection: Secondary | ICD-10-CM | POA: Diagnosis not present

## 2015-11-25 DIAGNOSIS — Z87891 Personal history of nicotine dependence: Secondary | ICD-10-CM | POA: Diagnosis not present

## 2015-11-25 DIAGNOSIS — E1122 Type 2 diabetes mellitus with diabetic chronic kidney disease: Secondary | ICD-10-CM | POA: Diagnosis not present

## 2015-11-25 DIAGNOSIS — Z7984 Long term (current) use of oral hypoglycemic drugs: Secondary | ICD-10-CM | POA: Diagnosis not present

## 2015-11-25 DIAGNOSIS — I2510 Atherosclerotic heart disease of native coronary artery without angina pectoris: Secondary | ICD-10-CM | POA: Diagnosis not present

## 2015-11-25 DIAGNOSIS — I110 Hypertensive heart disease with heart failure: Secondary | ICD-10-CM | POA: Diagnosis not present

## 2015-11-25 DIAGNOSIS — N183 Chronic kidney disease, stage 3 (moderate): Secondary | ICD-10-CM | POA: Diagnosis not present

## 2015-11-25 DIAGNOSIS — Z7982 Long term (current) use of aspirin: Secondary | ICD-10-CM | POA: Diagnosis not present

## 2015-11-25 DIAGNOSIS — J189 Pneumonia, unspecified organism: Secondary | ICD-10-CM | POA: Diagnosis not present

## 2015-11-25 DIAGNOSIS — I5033 Acute on chronic diastolic (congestive) heart failure: Secondary | ICD-10-CM | POA: Diagnosis not present

## 2015-11-25 DIAGNOSIS — Z951 Presence of aortocoronary bypass graft: Secondary | ICD-10-CM | POA: Diagnosis not present

## 2015-11-26 DIAGNOSIS — I2510 Atherosclerotic heart disease of native coronary artery without angina pectoris: Secondary | ICD-10-CM | POA: Diagnosis not present

## 2015-11-26 DIAGNOSIS — J189 Pneumonia, unspecified organism: Secondary | ICD-10-CM | POA: Diagnosis not present

## 2015-11-26 DIAGNOSIS — Z951 Presence of aortocoronary bypass graft: Secondary | ICD-10-CM | POA: Diagnosis not present

## 2015-11-26 DIAGNOSIS — N183 Chronic kidney disease, stage 3 (moderate): Secondary | ICD-10-CM | POA: Diagnosis not present

## 2015-11-26 DIAGNOSIS — Z792 Long term (current) use of antibiotics: Secondary | ICD-10-CM | POA: Diagnosis not present

## 2015-11-26 DIAGNOSIS — I5033 Acute on chronic diastolic (congestive) heart failure: Secondary | ICD-10-CM | POA: Diagnosis not present

## 2015-11-26 DIAGNOSIS — E1122 Type 2 diabetes mellitus with diabetic chronic kidney disease: Secondary | ICD-10-CM | POA: Diagnosis not present

## 2015-11-26 DIAGNOSIS — Z7984 Long term (current) use of oral hypoglycemic drugs: Secondary | ICD-10-CM | POA: Diagnosis not present

## 2015-11-26 DIAGNOSIS — Z7982 Long term (current) use of aspirin: Secondary | ICD-10-CM | POA: Diagnosis not present

## 2015-11-26 DIAGNOSIS — J440 Chronic obstructive pulmonary disease with acute lower respiratory infection: Secondary | ICD-10-CM | POA: Diagnosis not present

## 2015-11-26 DIAGNOSIS — Z87891 Personal history of nicotine dependence: Secondary | ICD-10-CM | POA: Diagnosis not present

## 2015-11-26 DIAGNOSIS — I110 Hypertensive heart disease with heart failure: Secondary | ICD-10-CM | POA: Diagnosis not present

## 2015-11-29 ENCOUNTER — Telehealth: Payer: Self-pay

## 2015-11-29 NOTE — Telephone Encounter (Signed)
Timothy Cordova OT with [facility] left v/m requesting verbal orders to [facility] for 2 weeks.

## 2015-11-29 NOTE — Telephone Encounter (Signed)
Verbal order given to Junious DresserConnie to [facility] 2 x week for 2 weeks per Dr. Patsy Lageropland.

## 2015-11-29 NOTE — Telephone Encounter (Signed)
Please do

## 2015-12-01 DIAGNOSIS — I5033 Acute on chronic diastolic (congestive) heart failure: Secondary | ICD-10-CM | POA: Diagnosis not present

## 2015-12-01 DIAGNOSIS — J440 Chronic obstructive pulmonary disease with acute lower respiratory infection: Secondary | ICD-10-CM | POA: Diagnosis not present

## 2015-12-01 DIAGNOSIS — Z951 Presence of aortocoronary bypass graft: Secondary | ICD-10-CM | POA: Diagnosis not present

## 2015-12-01 DIAGNOSIS — Z792 Long term (current) use of antibiotics: Secondary | ICD-10-CM | POA: Diagnosis not present

## 2015-12-01 DIAGNOSIS — Z87891 Personal history of nicotine dependence: Secondary | ICD-10-CM | POA: Diagnosis not present

## 2015-12-01 DIAGNOSIS — Z7982 Long term (current) use of aspirin: Secondary | ICD-10-CM | POA: Diagnosis not present

## 2015-12-01 DIAGNOSIS — N183 Chronic kidney disease, stage 3 (moderate): Secondary | ICD-10-CM | POA: Diagnosis not present

## 2015-12-01 DIAGNOSIS — J189 Pneumonia, unspecified organism: Secondary | ICD-10-CM | POA: Diagnosis not present

## 2015-12-01 DIAGNOSIS — E1122 Type 2 diabetes mellitus with diabetic chronic kidney disease: Secondary | ICD-10-CM | POA: Diagnosis not present

## 2015-12-01 DIAGNOSIS — I110 Hypertensive heart disease with heart failure: Secondary | ICD-10-CM | POA: Diagnosis not present

## 2015-12-01 DIAGNOSIS — Z7984 Long term (current) use of oral hypoglycemic drugs: Secondary | ICD-10-CM | POA: Diagnosis not present

## 2015-12-01 DIAGNOSIS — I2510 Atherosclerotic heart disease of native coronary artery without angina pectoris: Secondary | ICD-10-CM | POA: Diagnosis not present

## 2015-12-03 DIAGNOSIS — Z951 Presence of aortocoronary bypass graft: Secondary | ICD-10-CM | POA: Diagnosis not present

## 2015-12-03 DIAGNOSIS — Z792 Long term (current) use of antibiotics: Secondary | ICD-10-CM | POA: Diagnosis not present

## 2015-12-03 DIAGNOSIS — I2510 Atherosclerotic heart disease of native coronary artery without angina pectoris: Secondary | ICD-10-CM | POA: Diagnosis not present

## 2015-12-03 DIAGNOSIS — J440 Chronic obstructive pulmonary disease with acute lower respiratory infection: Secondary | ICD-10-CM | POA: Diagnosis not present

## 2015-12-03 DIAGNOSIS — E1122 Type 2 diabetes mellitus with diabetic chronic kidney disease: Secondary | ICD-10-CM | POA: Diagnosis not present

## 2015-12-03 DIAGNOSIS — Z7982 Long term (current) use of aspirin: Secondary | ICD-10-CM | POA: Diagnosis not present

## 2015-12-03 DIAGNOSIS — Z87891 Personal history of nicotine dependence: Secondary | ICD-10-CM | POA: Diagnosis not present

## 2015-12-03 DIAGNOSIS — J189 Pneumonia, unspecified organism: Secondary | ICD-10-CM | POA: Diagnosis not present

## 2015-12-03 DIAGNOSIS — I110 Hypertensive heart disease with heart failure: Secondary | ICD-10-CM | POA: Diagnosis not present

## 2015-12-03 DIAGNOSIS — N183 Chronic kidney disease, stage 3 (moderate): Secondary | ICD-10-CM | POA: Diagnosis not present

## 2015-12-03 DIAGNOSIS — I5033 Acute on chronic diastolic (congestive) heart failure: Secondary | ICD-10-CM | POA: Diagnosis not present

## 2015-12-03 DIAGNOSIS — Z7984 Long term (current) use of oral hypoglycemic drugs: Secondary | ICD-10-CM | POA: Diagnosis not present

## 2015-12-06 DIAGNOSIS — J440 Chronic obstructive pulmonary disease with acute lower respiratory infection: Secondary | ICD-10-CM | POA: Diagnosis not present

## 2015-12-06 DIAGNOSIS — Z792 Long term (current) use of antibiotics: Secondary | ICD-10-CM | POA: Diagnosis not present

## 2015-12-06 DIAGNOSIS — N183 Chronic kidney disease, stage 3 (moderate): Secondary | ICD-10-CM | POA: Diagnosis not present

## 2015-12-06 DIAGNOSIS — I110 Hypertensive heart disease with heart failure: Secondary | ICD-10-CM | POA: Diagnosis not present

## 2015-12-06 DIAGNOSIS — I2510 Atherosclerotic heart disease of native coronary artery without angina pectoris: Secondary | ICD-10-CM | POA: Diagnosis not present

## 2015-12-06 DIAGNOSIS — E1122 Type 2 diabetes mellitus with diabetic chronic kidney disease: Secondary | ICD-10-CM | POA: Diagnosis not present

## 2015-12-06 DIAGNOSIS — I5033 Acute on chronic diastolic (congestive) heart failure: Secondary | ICD-10-CM | POA: Diagnosis not present

## 2015-12-06 DIAGNOSIS — Z87891 Personal history of nicotine dependence: Secondary | ICD-10-CM | POA: Diagnosis not present

## 2015-12-06 DIAGNOSIS — Z7982 Long term (current) use of aspirin: Secondary | ICD-10-CM | POA: Diagnosis not present

## 2015-12-06 DIAGNOSIS — Z7984 Long term (current) use of oral hypoglycemic drugs: Secondary | ICD-10-CM | POA: Diagnosis not present

## 2015-12-06 DIAGNOSIS — Z951 Presence of aortocoronary bypass graft: Secondary | ICD-10-CM | POA: Diagnosis not present

## 2015-12-06 DIAGNOSIS — J189 Pneumonia, unspecified organism: Secondary | ICD-10-CM | POA: Diagnosis not present

## 2015-12-08 DIAGNOSIS — J440 Chronic obstructive pulmonary disease with acute lower respiratory infection: Secondary | ICD-10-CM | POA: Diagnosis not present

## 2015-12-08 DIAGNOSIS — I110 Hypertensive heart disease with heart failure: Secondary | ICD-10-CM | POA: Diagnosis not present

## 2015-12-08 DIAGNOSIS — E1122 Type 2 diabetes mellitus with diabetic chronic kidney disease: Secondary | ICD-10-CM | POA: Diagnosis not present

## 2015-12-08 DIAGNOSIS — Z7982 Long term (current) use of aspirin: Secondary | ICD-10-CM | POA: Diagnosis not present

## 2015-12-08 DIAGNOSIS — I5033 Acute on chronic diastolic (congestive) heart failure: Secondary | ICD-10-CM | POA: Diagnosis not present

## 2015-12-08 DIAGNOSIS — N183 Chronic kidney disease, stage 3 (moderate): Secondary | ICD-10-CM | POA: Diagnosis not present

## 2015-12-08 DIAGNOSIS — Z87891 Personal history of nicotine dependence: Secondary | ICD-10-CM | POA: Diagnosis not present

## 2015-12-08 DIAGNOSIS — Z951 Presence of aortocoronary bypass graft: Secondary | ICD-10-CM | POA: Diagnosis not present

## 2015-12-08 DIAGNOSIS — I2510 Atherosclerotic heart disease of native coronary artery without angina pectoris: Secondary | ICD-10-CM | POA: Diagnosis not present

## 2015-12-08 DIAGNOSIS — Z7984 Long term (current) use of oral hypoglycemic drugs: Secondary | ICD-10-CM | POA: Diagnosis not present

## 2015-12-08 DIAGNOSIS — J189 Pneumonia, unspecified organism: Secondary | ICD-10-CM | POA: Diagnosis not present

## 2015-12-08 DIAGNOSIS — Z792 Long term (current) use of antibiotics: Secondary | ICD-10-CM | POA: Diagnosis not present

## 2015-12-10 ENCOUNTER — Ambulatory Visit (INDEPENDENT_AMBULATORY_CARE_PROVIDER_SITE_OTHER)
Admission: RE | Admit: 2015-12-10 | Discharge: 2015-12-10 | Disposition: A | Discharge status: Home / Self Care | Payer: Medicare Other | Source: Ambulatory Visit | Attending: Pulmonary Disease | Admitting: Pulmonary Disease

## 2015-12-10 ENCOUNTER — Encounter: Payer: Self-pay | Admitting: Pulmonary Disease

## 2015-12-10 DIAGNOSIS — J189 Pneumonia, unspecified organism: Secondary | ICD-10-CM | POA: Diagnosis not present

## 2015-12-10 DIAGNOSIS — J181 Lobar pneumonia, unspecified organism: Principal | ICD-10-CM

## 2015-12-10 DIAGNOSIS — R911 Solitary pulmonary nodule: Secondary | ICD-10-CM | POA: Insufficient documentation

## 2016-01-03 ENCOUNTER — Ambulatory Visit (INDEPENDENT_AMBULATORY_CARE_PROVIDER_SITE_OTHER): Payer: Medicare Other | Admitting: Pulmonary Disease

## 2016-01-03 ENCOUNTER — Encounter: Payer: Self-pay | Admitting: Pulmonary Disease

## 2016-01-03 DIAGNOSIS — J432 Centrilobular emphysema: Secondary | ICD-10-CM

## 2016-01-03 DIAGNOSIS — Z23 Encounter for immunization: Secondary | ICD-10-CM | POA: Diagnosis not present

## 2016-01-03 NOTE — Patient Instructions (Signed)
Continue taking Stiolto as you are doing Be sure to wash her hands frequently when you are out in public We will see you back in 6 months or sooner if needed

## 2016-01-03 NOTE — Progress Notes (Signed)
Subjective:    Patient ID: Timothy Cordova, male    DOB: Sep 06, 1928, 82 y.o.   MRN: [id number]  Synopsis: Referred in 2017 for COPD. August 2016 pulmonary function testing clear airflow obstruction with ratio 57%, FEV1 1.06 L (37% predicted) August 2016 high resolution CT chest: No evidence of pulmonary fibrosis, moderate to severe emphysema noted 12/2015 CT > resolved pneumonia, nodule stable, persistent emphysema   HPI Chief Complaint  Patient presents with  . Follow-up    Pt reports his breathing is doing very well. C/o slight wheezing, dry cough.    He has been taking 2 puffs in the morning and it is very helpful.  He says that his breathing is better than it has been in years.  No evidence of pneumonia. ON mucus production.   He remains active, he has been doing yard work and all the cooking and cleaning in the house.  He has been doing all the housework.  He is not limited by dyspnea. If he has dyspnea he is comfortable after resting a few minutes. He needs albuterol only 1-2 times per week.   No bronchitis or flares of COPD since the last visit, no recent antibiotics or prednisone.  Past Medical History:  Diagnosis Date  . Allergic rhinitis   . Benign prostatic hypertrophy   . Chronic diastolic heart failure (HCC)    8/08 ECHO with EF 55%  . CKD (chronic kidney disease)   . COPD (chronic obstructive pulmonary disease) (HCC)    moderate. followed by Dr.Byrum  . Coronary artery disease    s/p CABG 2006. LHC (1/10): SVG-OM and PLOM, SVG-D, and LIMA-LAD were all patent  . Dementia    (mild)--06/2006  . Diabetes mellitus   . Edema    localized. suspect diastolic CHF + venous insufficiency  . Hearing loss    left >>right  . History of tobacco abuse   . Hypercholesterolemia   . Hypertension   . Mixed hyperlipidemia   . Osteoarthritis   . Sleep apnea    ?      Review of Systems     Objective:   Physical Exam Vitals:   01/03/16 1410  BP: 120/66  Pulse: 60    SpO2: 96%  Weight: 191 lb 3.2 oz (86.7 kg)  Height: 5' 7.5" (1.715 m)    RA  Gen: well appearing HENT: OP clear, neck supple PULM: CTA B, normal percussion CV: RRR, no mgr, trace edema GI: BS+, soft, nontender Derm: no cyanosis or rash Psyche: normal mood and affect       Assessment & Plan:  Centrilobular emphysema (HCC) This has been a stable interval for him as he has not had an exacerbation since the last visit and he is feeling great on Stiolto.  Plan: Continue Stiolto Flu shot today Follow-up 6 months    Current Outpatient Prescriptions:  .  acetaminophen (TYLENOL) 325 MG tablet, Take 650 mg by mouth at bedtime., Disp: , Rfl:  .  albuterol (PROVENTIL HFA;VENTOLIN HFA) 108 (90 Base) MCG/ACT inhaler, Inhale 2 puffs into the lungs every 6 (six) hours as needed for wheezing or shortness of breath., Disp: 2 Inhaler, Rfl: 0 .  atorvastatin (LIPITOR) 80 MG tablet, Take 40 mg by mouth daily., Disp: , Rfl:  .  Cholecalciferol (VITAMIN D-3 PO), Take 1,000 Units by mouth daily. , Disp: , Rfl:  .  doxazosin (CARDURA) 4 MG tablet, TAKE 1 TO 2 TABLETS BY MOUTH EVERY DAY AS DIRECTED. (Patient taking differently:  TAKE 1 TABLETS BY MOUTH EVERY evening), Disp: 180 tablet, Rfl: 3 .  furosemide (LASIX) 20 MG tablet, Take 20 mg by mouth daily as needed for fluid. , Disp: , Rfl:  .  GLIPIZIDE XL 10 MG 24 hr tablet, TAKE TWO (2) TABLETS BY MOUTH DAILY (Patient taking differently: TAKE One (1) TABLET (10 mg) BY MOUTH twice a day), Disp: 180 tablet, Rfl: 3 .  isosorbide mononitrate (IMDUR) 30 MG 24 hr tablet, Take 1 tablet (30 mg total) by mouth daily., Disp: 90 tablet, Rfl: 3 .  metoprolol succinate (TOPROL-XL) 50 MG 24 hr tablet, Take 25 mg by mouth daily. Take with or immediately following a meal., Disp: , Rfl:  .  Multiple Vitamins-Minerals (PRESERVISION AREDS PO), Take 1 tablet by mouth daily. , Disp: , Rfl:  .  ONE TOUCH ULTRA TEST test strip, USE TO CHECK BLOOD SUGAR 2 TIMES DAILY AS  DIRECTED, Disp: 50 each, Rfl: 11 .  ONETOUCH DELICA LANCETS 33G MISC, , Disp: , Rfl:  .  Tiotropium Bromide-Olodaterol (STIOLTO RESPIMAT) 2.5-2.5 MCG/ACT AERS, Inhale 2 puffs into the lungs every morning. , Disp: , Rfl:

## 2016-01-03 NOTE — Assessment & Plan Note (Signed)
This has been a stable interval for him as he has not had an exacerbation since the last visit and he is feeling great on Stiolto.  Plan: Continue Stiolto Flu shot today Follow-up 6 months

## 2016-01-24 ENCOUNTER — Ambulatory Visit (INDEPENDENT_AMBULATORY_CARE_PROVIDER_SITE_OTHER): Payer: Medicare Other | Admitting: Family Medicine

## 2016-01-24 ENCOUNTER — Encounter: Payer: Self-pay | Admitting: Family Medicine

## 2016-01-24 ENCOUNTER — Ambulatory Visit (INDEPENDENT_AMBULATORY_CARE_PROVIDER_SITE_OTHER)
Admission: RE | Admit: 2016-01-24 | Discharge: 2016-01-24 | Disposition: A | Discharge status: Home / Self Care | Payer: Medicare Other | Source: Ambulatory Visit | Attending: Family Medicine | Admitting: Family Medicine

## 2016-01-24 VITALS — BP 120/60 | HR 62 | Temp 97.5°F | Ht 67.5 in | Wt 192.2 lb

## 2016-01-24 DIAGNOSIS — S8012XA Contusion of left lower leg, initial encounter: Secondary | ICD-10-CM | POA: Diagnosis not present

## 2016-01-24 DIAGNOSIS — L03116 Cellulitis of left lower limb: Secondary | ICD-10-CM | POA: Diagnosis not present

## 2016-01-24 DIAGNOSIS — M79605 Pain in left leg: Secondary | ICD-10-CM

## 2016-01-24 MED ORDER — CEPHALEXIN 500 MG PO CAPS: 1000.0000 mg | ORAL_CAPSULE | Freq: Two times a day (BID) | ORAL | 0 refills | Status: DC

## 2016-01-24 NOTE — Progress Notes (Signed)
Dr. Karleen Hampshire T. Gwenette Wellons, MD, CAQ [facility] [address] [place], [address] Phone: [phone] Fax: [phone]  01/24/2016  Patient: GAYLAND NICOL, MRN: [id number], DOB: 11/02/1928, 82 y.o.  Primary Physician:  Hannah Beat, MD   Chief Complaint  Patient presents with  . Hit leg on ladder    Left x 2 weeks ago   Subjective:   AHMADOU BOLZ is an 82 y.o. very pleasant male patient who presents with the following:  2 weeks ago - fell and hit his ladder in his chicken house, has been walking.  hhis wife is been checking this daily.  He did initially break the skin, but this is healed over at this point.  Over the last 2 days it has been, quite a bit more painful and it has become warm to touch.  There is an elevation to the area consistent with hematoma.  Previously there was quite a bit of bruising, but this has resolved.  Past Medical History, Surgical History, Social History, Family History, Problem List, Medications, and Allergies have been reviewed and updated if relevant.  Patient Active Problem List   Diagnosis Date Noted  . CKD (chronic kidney disease) stage 3, GFR 30-59 ml/min 08/18/2010    Priority: High  . DIASTOLIC HEART FAILURE, CHRONIC 08/18/2009    Priority: High  . CAD, AUTOLOGOUS BYPASS GRAFT 06/07/2008    Priority: High  . DM (diabetes mellitus), type 2 with renal complications (HCC) 09/04/2007    Priority: High  . Centrilobular emphysema (HCC) 08/08/2005    Priority: High  . Solitary pulmonary nodule 12/10/2015  . Acute on chronic diastolic heart failure (HCC) 10/08/2015  . Chronic obstructive pulmonary disease (HCC)   . CAP (community acquired pneumonia) 10/06/2015  . Pneumonia involving right lung 10/06/2015  . Anemia 11/26/2014  . Aortic calcification, 04/20/2005 CT Chest 04/03/2013  . Sleep apnea   . Tobacco abuse (in remission)   . Chronic low back pain 04/18/2011  . Renal lesion 03/27/2011   . OSTEOARTHRITIS 12/06/2009  . Mixed hyperlipidemia 06/11/2008  . ALLERGIC RHINITIS 01/30/2007  . DEMENTIA 07/06/2006  . HEARING LOSS 07/06/2006  . Essential hypertension 07/06/2006  . BENIGN PROSTATIC HYPERTROPHY 07/06/2006    Past Medical History:  Diagnosis Date  . Allergic rhinitis   . Benign prostatic hypertrophy   . Chronic diastolic heart failure (HCC)    8/08 ECHO with EF 55%  . CKD (chronic kidney disease)   . COPD (chronic obstructive pulmonary disease) (HCC)    moderate. followed by Dr.Byrum  . Coronary artery disease    s/p CABG 2006. LHC (1/10): SVG-OM and PLOM, SVG-D, and LIMA-LAD were all patent  . Dementia    (mild)--06/2006  . Diabetes mellitus   . Edema    localized. suspect diastolic CHF + venous insufficiency  . Hearing loss    left >>right  . History of tobacco abuse   . Hypercholesterolemia   . Hypertension   . Mixed hyperlipidemia   . Osteoarthritis   . Sleep apnea    ?    Past Surgical History:  Procedure Laterality Date  . ANGIOPLASTY  [id number]  . CARDIAC CATHETERIZATION  90s   [place], [place] x1stent  . CARDIAC CATHETERIZATION     [facility];x2 stents  . CARDIAC CATHETERIZATION  04/29/2013  . CORONARY ARTERY BYPASS GRAFT  02/2005  . ROTATOR CUFF REPAIR  2005   left, [place]    Social History   Social History  .  Marital status: Married    Spouse name: N/A  . Number of children: N/A  . Years of education: N/A   Occupational History  . rubber fabrication Retired    RETIRED   Social History Main Topics  . Smoking status: Former Smoker    Packs/day: 3.00    Years: 60.00    Types: Cigarettes    Quit date: 07/12/2005  . Smokeless tobacco: Never Used  . Alcohol use No  . Drug use: No  . Sexual activity: Not on file   Other Topics Concern  . Not on file   Social History Narrative  . No narrative on file    Family History  Problem Relation Age of Onset  . Heart attack Mother   . Lung cancer Sister     Allergies  Allergen  Reactions  . Metformin And Related   . Tramadol Hcl     Tremor vs seizure activity  . Ace Inhibitors     REACTION: cough  . Atorvastatin     REACTION: weakness  . Codeine Nausea And Vomiting  . Morphine Sulfate     REACTION: unspecified  . Ticlopidine Hcl     REACTION: unspecified  . Warfarin Sodium Other (See Comments)    Nausea and vomiting     Medication list reviewed and updated in full in [facility].  GEN: No fevers, chills. Nontoxic. Primarily MSK c/o today. MSK: Detailed in the HPI GI: tolerating PO intake without difficulty Neuro: No numbness, parasthesias, or tingling associated. Otherwise the pertinent positives of the ROS are noted above.   Objective:   BP 120/60   Pulse 62   Temp 97.5 F (36.4 C) (Oral)   Ht 5' 7.5" (1.715 m)   Wt 192 lb 4 oz (87.2 kg)   BMI 29.67 kg/m    GEN: WDWN, NAD, Non-toxic, Alert & Oriented x 3 HEENT: Atraumatic, Normocephalic.  Ears and Nose: No external deformity. EXTR: No clubbing/cyanosis/edema NEURO: Normal gait.  PSYCH: Normally interactive. Conversant. Not depressed or anxious appearing.  Calm demeanor.    lleft foot and ankle is nontender throughout the entirety and including nontender throughout all bony anatomy.  Nontender along the length of the tibia.  Fibula is difficult to palpate, and there is a goose egg-type area that is elevated on the lateral aspect with some color change.  This also has some warmth and redness associated with it.  There is no fluctuance.  Radiology: Dg Tibia/fibula Left  Result Date: 01/25/2016 CLINICAL DATA:  Lateral leg trauma 2 weeks ago, left leg pain EXAM: LEFT TIBIA AND FIBULA - 2 VIEW COMPARISON:  None. FINDINGS: Four views of the left tibia fibula submitted. No acute fracture or subluxation. No radiopaque foreign body. No periosteal reaction or bony erosion. Mild narrowing of medial joint compartment left knee joint. IMPRESSION: No acute fracture or subluxation. Electronically  Signed   By: Natasha MeadLiviu  Pop M.D.   On: 01/25/2016 08:25     Assessment and Plan:   Cellulitis of leg, left  Leg pain, left - Plan: DG Tibia/Fibula Left  Traumatic hematoma of left lower leg, initial encounter  Traumatic hematoma 2 weeks ago.  Concerned that this may have developed and converted into cellulitis also.  I'm asked him to continue to observe it, we will place him on some oral Keflex.  Follow-up: if needed  New Prescriptions   CEPHALEXIN (KEFLEX) 500 MG CAPSULE    Take 2 capsules (1,000 mg total) by mouth 2 (two) times daily.  Orders Placed This Encounter  Procedures  . DG Tibia/Fibula Left    Signed,  Link Burgeson T. Soraiya Ahner, MD   Patient's Medications  New Prescriptions   CEPHALEXIN (KEFLEX) 500 MG CAPSULE    Take 2 capsules (1,000 mg total) by mouth 2 (two) times daily.  Previous Medications   ACETAMINOPHEN (TYLENOL) 325 MG TABLET    Take 650 mg by mouth at bedtime.   ALBUTEROL (PROVENTIL HFA;VENTOLIN HFA) 108 (90 BASE) MCG/ACT INHALER    Inhale 2 puffs into the lungs every 6 (six) hours as needed for wheezing or shortness of breath.   ATORVASTATIN (LIPITOR) 80 MG TABLET    Take 40 mg by mouth daily.   CHOLECALCIFEROL (VITAMIN D-3 PO)    Take 1,000 Units by mouth daily.    DOXAZOSIN (CARDURA) 4 MG TABLET    TAKE 1 TO 2 TABLETS BY MOUTH EVERY DAY AS DIRECTED.   FUROSEMIDE (LASIX) 20 MG TABLET    Take 20 mg by mouth daily as needed for fluid.    GLIPIZIDE XL 10 MG 24 HR TABLET    TAKE TWO (2) TABLETS BY MOUTH DAILY   ISOSORBIDE MONONITRATE (IMDUR) 30 MG 24 HR TABLET    Take 1 tablet (30 mg total) by mouth daily.   METOPROLOL SUCCINATE (TOPROL-XL) 50 MG 24 HR TABLET    Take 25 mg by mouth daily. Take with or immediately following a meal.   MULTIPLE VITAMINS-MINERALS (PRESERVISION AREDS PO)    Take 1 tablet by mouth daily.    ONE TOUCH ULTRA TEST TEST STRIP    USE TO CHECK BLOOD SUGAR 2 TIMES DAILY AS DIRECTED   ONETOUCH DELICA LANCETS 33G MISC       TIOTROPIUM  BROMIDE-OLODATEROL (STIOLTO RESPIMAT) 2.5-2.5 MCG/ACT AERS    Inhale 2 puffs into the lungs every morning.   Modified Medications   No medications on file  Discontinued Medications   No medications on file

## 2016-01-24 NOTE — Progress Notes (Signed)
Pre visit review using our clinic review tool, if applicable. No additional management support is needed unless otherwise documented below in the visit note. 

## 2016-04-25 ENCOUNTER — Other Ambulatory Visit: Payer: Self-pay | Admitting: Family Medicine

## 2016-06-26 ENCOUNTER — Other Ambulatory Visit (INDEPENDENT_AMBULATORY_CARE_PROVIDER_SITE_OTHER): Payer: Medicare PPO

## 2016-06-26 ENCOUNTER — Encounter (INDEPENDENT_AMBULATORY_CARE_PROVIDER_SITE_OTHER): Payer: Self-pay

## 2016-06-26 DIAGNOSIS — Z79899 Other long term (current) drug therapy: Secondary | ICD-10-CM

## 2016-06-26 DIAGNOSIS — Z125 Encounter for screening for malignant neoplasm of prostate: Secondary | ICD-10-CM | POA: Diagnosis not present

## 2016-06-26 DIAGNOSIS — E784 Other hyperlipidemia: Secondary | ICD-10-CM | POA: Diagnosis not present

## 2016-06-26 DIAGNOSIS — E7849 Other hyperlipidemia: Secondary | ICD-10-CM

## 2016-06-26 DIAGNOSIS — E119 Type 2 diabetes mellitus without complications: Secondary | ICD-10-CM | POA: Diagnosis not present

## 2016-06-26 LAB — HEPATIC FUNCTION PANEL
ALBUMIN: 3.7 g/dL (ref 3.5–5.2)
ALT: 11 U/L (ref 0–53)
AST: 11 U/L (ref 0–37)
Alkaline Phosphatase: 58 U/L (ref 39–117)
BILIRUBIN TOTAL: 0.3 mg/dL (ref 0.2–1.2)
Bilirubin, Direct: 0.1 mg/dL (ref 0.0–0.3)
TOTAL PROTEIN: 5.9 g/dL — AB (ref 6.0–8.3)

## 2016-06-26 LAB — BASIC METABOLIC PANEL
BUN: 44 mg/dL — ABNORMAL HIGH (ref 6–23)
CO2: 29 mEq/L (ref 19–32)
Calcium: 9.6 mg/dL (ref 8.4–10.5)
Chloride: 108 mEq/L (ref 96–112)
Creatinine, Ser: 1.53 mg/dL — ABNORMAL HIGH (ref 0.40–1.50)
GFR: 46.08 mL/min — AB (ref >60.00)
GLUCOSE: 100 mg/dL — AB (ref 70–99)
POTASSIUM: 5.6 meq/L — AB (ref 3.5–5.1)
SODIUM: 142 meq/L (ref 135–145)

## 2016-06-26 LAB — LIPID PANEL
Cholesterol: 139 mg/dL (ref 0–200)
HDL: 28.1 mg/dL — AB (ref >39.00)
LDL CALC: 78 mg/dL (ref 0–99)
NONHDL: 111.07
Total CHOL/HDL Ratio: 5
Triglycerides: 166 mg/dL — ABNORMAL HIGH (ref 0.0–149.0)
VLDL: 33.2 mg/dL (ref 0.0–40.0)

## 2016-06-26 LAB — CBC WITH DIFFERENTIAL/PLATELET
Basophils Absolute: 0.1 10*3/uL (ref 0.0–0.1)
Basophils Relative: 1.4 % (ref 0.0–3.0)
EOS PCT: 3.8 % (ref 0.0–5.0)
Eosinophils Absolute: 0.3 10*3/uL (ref 0.0–0.7)
HEMATOCRIT: 37.8 % — AB (ref 39.0–52.0)
HEMOGLOBIN: 12.2 g/dL — AB (ref 13.0–17.0)
LYMPHS PCT: 18.7 % (ref 12.0–46.0)
Lymphs Abs: 1.5 10*3/uL (ref 0.7–4.0)
MCHC: 32.3 g/dL (ref 30.0–36.0)
MCV: 94.3 fl (ref 78.0–100.0)
MONOS PCT: 11.3 % (ref 3.0–12.0)
Monocytes Absolute: 0.9 10*3/uL (ref 0.1–1.0)
Neutro Abs: 5.3 10*3/uL (ref 1.4–7.7)
Neutrophils Relative %: 64.8 % (ref 43.0–77.0)
Platelets: 196 10*3/uL (ref 150.0–400.0)
RBC: 4.01 Mil/uL — AB (ref 4.22–5.81)
RDW: 14.2 % (ref 11.5–15.5)
WBC: 8.2 10*3/uL (ref 4.0–10.5)

## 2016-06-26 LAB — MICROALBUMIN / CREATININE URINE RATIO
Creatinine,U: 130.7 mg/dL
MICROALB UR: 6.8 mg/dL — AB (ref 0.0–1.9)
Microalb Creat Ratio: 5.2 mg/g (ref 0.0–30.0)

## 2016-06-26 LAB — PSA, MEDICARE: PSA: 5.52 ng/ml — ABNORMAL HIGH (ref 0.10–4.00)

## 2016-06-26 LAB — HEMOGLOBIN A1C: Hgb A1c MFr Bld: 7.6 % — ABNORMAL HIGH (ref 4.6–6.5)

## 2016-07-03 ENCOUNTER — Encounter: Payer: Self-pay | Admitting: Family Medicine

## 2016-07-03 ENCOUNTER — Ambulatory Visit (INDEPENDENT_AMBULATORY_CARE_PROVIDER_SITE_OTHER): Payer: Medicare PPO | Admitting: Family Medicine

## 2016-07-03 VITALS — BP 132/60 | HR 58 | Temp 98.5°F | Ht 66.0 in | Wt 189.5 lb

## 2016-07-03 DIAGNOSIS — Z0000 Encounter for general adult medical examination without abnormal findings: Secondary | ICD-10-CM

## 2016-07-03 MED ORDER — TRAZODONE HCL 50 MG PO TABS: 50.0000 mg | ORAL_TABLET | Freq: Every evening | ORAL | 3 refills | Status: DC | PRN

## 2016-07-03 NOTE — Progress Notes (Signed)
Dr. Frederico Hamman T. Rainy Rothman, MD, [facility] [place], [address] Phone: [phone] Fax: [phone]  07/03/2016  Patient: Timothy Cordova, MRN: [id number], DOB: [birth year], 83 y.o.  Primary Physician:  Owens Loffler, MD   Chief Complaint  Patient presents with  . Medicare Wellness   Subjective:   Timothy Cordova is a 83 y.o. pleasant patient who presents for a medicare wellness examination:  Preventative Health Maintenance Visit:  Health Maintenance Summary Reviewed and updated, unless pt declines services.  Tobacco History Reviewed. Alcohol: No concerns, no excessive use Exercise Habits: rare activity, rec at least 30 mins 5 times a week STD concerns: no risk or activity to increase risk Drug Use: None Encouraged self-testicular check  Sleep  Eye md at [facility] feeling very good, and he is breathing better than he has in years.  He generally feels better than he has over the last few years overall.  No complaints.  He is having some occasional insomnia.  Health Maintenance  Topic Date Due  . HEMOGLOBIN A1C  12/27/2016  . FOOT EXAM  07/03/2017  . OPHTHALMOLOGY EXAM  07/03/2017  . TETANUS/TDAP  04/25/2025  . INFLUENZA VACCINE  Completed  . PNA vac Low Risk Adult  Completed    Immunization History  Administered Date(s) Administered  . Influenza Split 03/21/2011, 01/09/2012  . Influenza Whole 01/30/2005  . Influenza, High Dose Seasonal PF 01/03/2016  . Pneumococcal Conjugate-13 08/19/2014  . Pneumococcal Polysaccharide-23 04/11/2003  . Td 04/11/2003, 04/26/2015  . Zoster 12/17/2007, 12/18/2007    Patient Active Problem List   Diagnosis Date Noted  . CKD (chronic kidney disease) stage 3, GFR 30-59 ml/min 08/18/2010    Priority: High  . DIASTOLIC HEART FAILURE, CHRONIC 08/18/2009    Priority: High  . CAD, AUTOLOGOUS BYPASS GRAFT 06/07/2008    Priority: High  . DM (diabetes mellitus),  type 2 with renal complications ([place]) [id number]/50/3888    Priority: High  . Centrilobular emphysema ([place]) 08/08/2005    Priority: High  . Solitary pulmonary nodule 12/10/2015  . Acute on chronic diastolic heart failure ([place]) 10/08/2015  . Chronic obstructive pulmonary disease ([place])   . CAP (community acquired pneumonia) 10/06/2015  . Pneumonia involving right lung 10/06/2015  . Anemia 11/26/2014  . Aortic calcification, 04/20/2005 CT Chest 04/03/2013  . Sleep apnea   . Tobacco abuse (in remission)   . Chronic low back pain 04/18/2011  . Renal lesion 03/27/2011  . OSTEOARTHRITIS 12/06/2009  . Mixed hyperlipidemia 06/11/2008  . ALLERGIC RHINITIS 01/30/2007  . DEMENTIA 07/06/2006  . HEARING LOSS 07/06/2006  . Essential hypertension 07/06/2006  . BENIGN PROSTATIC HYPERTROPHY 07/06/2006   Past Medical History:  Diagnosis Date  . Allergic rhinitis   . Benign prostatic hypertrophy   . Chronic diastolic heart failure ([place])    8/08 ECHO with EF 55%  . CKD (chronic kidney disease)   . COPD (chronic obstructive pulmonary disease) ([facility])    moderate. followed by Dr.Byrum  . Coronary artery disease    s/p CABG 2006. LHC (1/10): SVG-OM and PLOM, SVG-D, and LIMA-LAD were all patent  . Dementia    (mild)--06/2006  . Diabetes mellitus   . Edema    localized. suspect diastolic CHF + venous insufficiency  . Hearing loss    left >>right  . History of tobacco abuse   . Hypercholesterolemia   . Hypertension   . Mixed hyperlipidemia   . Osteoarthritis   .  Sleep apnea    ?   Past Surgical History:  Procedure Laterality Date  . ANGIOPLASTY  [id number]  . CARDIAC CATHETERIZATION  90s   [place], [place] x1stent  . CARDIAC CATHETERIZATION     [facility];x2 stents  . CARDIAC CATHETERIZATION  04/29/2013  . CORONARY ARTERY BYPASS GRAFT  02/2005  . ROTATOR CUFF REPAIR  2005   left, [place]   Social History   Social History  . Marital status: Married    Spouse name: N/A  . Number of children: N/A  . Years  of education: N/A   Occupational History  . rubber fabrication Retired    RETIRED   Social History Main Topics  . Smoking status: Former Smoker    Packs/day: 3.00    Years: 60.00    Types: Cigarettes    Quit date: 07/12/2005  . Smokeless tobacco: Never Used  . Alcohol use No  . Drug use: No  . Sexual activity: Not on file   Other Topics Concern  . Not on file   Social History Narrative  . No narrative on file   Family History  Problem Relation Age of Onset  . Heart attack Mother   . Lung cancer Sister    Allergies  Allergen Reactions  . Metformin And Related   . Tramadol Hcl     Tremor vs seizure activity  . Ace Inhibitors     REACTION: cough  . Atorvastatin     REACTION: weakness  . Codeine Nausea And Vomiting  . Morphine Sulfate     REACTION: unspecified  . Ticlopidine Hcl     REACTION: unspecified  . Warfarin Sodium Other (See Comments)    Nausea and vomiting     Medication list has been reviewed and updated.   General: Denies fever, chills, sweats. No significant weight loss. Eyes: Denies blurring,significant itching ENT: Denies earache, sore throat, and hoarseness. Cardiovascular: Denies chest pains, palpitations, dyspnea on exertion Respiratory: Denies cough, dyspnea at rest,wheeezing Breast: no concerns about lumps GI: Denies nausea, vomiting, diarrhea, constipation, change in bowel habits, abdominal pain, melena, hematochezia GU: Denies penile discharge, ED, urinary flow / outflow problems. No STD concerns. Musculoskeletal: Denies back pain, joint pain Derm: Denies rash, itching Neuro: Denies  paresthesias, frequent falls, frequent headaches Psych: Denies depression, anxiety Endocrine: Denies cold intolerance, heat intolerance, polydipsia Heme: Denies enlarged lymph nodes Allergy: No hayfever  Objective:   BP 132/60   Pulse (!) 58   Temp 98.5 F (36.9 C) (Oral)   Ht _0  (1.676 m)   Wt 189 lb 8 oz (86 kg)   SpO2 98%   BMI 30.59 kg/m    The patient completed a fall screen and PHQ-2 and PHQ-9 if necessary, which is documented in the EHR. The CMA/LPN/RN who assisted the patient verbally completed with them and documented results in [place].   Visual Acuity Screening   Right eye Left eye Both eyes  Without correction:     With correction: _1  Hearing Screening Comments: Has bilaterall hearing aides but does not wear them  GEN: well developed, well nourished, no acute distress Eyes: conjunctiva and lids normal, PERRLA, EOMI ENT: TM clear, nares clear, oral exam WNL Neck: supple, no lymphadenopathy, no thyromegaly, no JVD Pulm: clear to auscultation and percussion, respiratory effort normal CV: regular rate and rhythm, S1-S2, no murmur, rub or gallop, no bruits, peripheral pulses normal and symmetric, no cyanosis, clubbing, edema or varicosities GI: soft, non-tender; no hepatosplenomegaly, masses;  active bowel sounds all quadrants GU: no hernia, testicular mass, penile discharge Lymph: no cervical, axillary or inguinal adenopathy MSK: gait normal, muscle tone and strength WNL, no joint swelling, effusions, discoloration, crepitus  SKIN: clear, good turgor, color WNL, no rashes, lesions, or ulcerations Neuro: normal mental status, normal strength, sensation, and motion Psych: alert; oriented to person, place and time, normally interactive and not anxious or depressed in appearance.  All labs reviewed with patient.  Lipids:    Component Value Date/Time   CHOL 139 06/26/2016 1030   CHOL 155 04/27/2013 0601   TRIG 166.0 (H) 06/26/2016 1030   TRIG 120 04/27/2013 0601   HDL 28.10 (L) 06/26/2016 1030   HDL 69 (H) 04/27/2013 0601   VLDL 33.2 06/26/2016 1030   VLDL 24 04/27/2013 0601   CHOLHDL 5 06/26/2016 1030   CBC: CBC Latest Ref Rng & Units 06/26/2016 10/13/2015 10/08/2015  WBC 4.0 - 10.5 K/uL 8.2 13.8(H) 8.0  Hemoglobin 13.0 - 17.0 g/dL 12.2(L) 11.2(L) 10.6(L)  Hematocrit 39.0 - 52.0 %  37.8(L) 35.3(L) 34.7(L)  Platelets 150.0 - 400.0 K/uL 196.0 418.0(H) 888    Basic Metabolic Panel:    Component Value Date/Time   NA 142 06/26/2016 1030   NA 133 (L) 05/01/2013 0457   K 5.6 (H) 06/26/2016 1030   K 4.7 05/01/2013 0457   CL 108 06/26/2016 1030   CL 101 05/01/2013 0457   CO2 29 06/26/2016 1030   CO2 29 05/01/2013 0457   BUN 44 (H) 06/26/2016 1030   BUN 41 (H) 05/01/2013 0457   CREATININE 1.53 (H) 06/26/2016 1030   CREATININE 1.57 (H) 05/01/2013 0457   GLUCOSE 100 (H) 06/26/2016 1030   GLUCOSE 274 (H) 05/01/2013 0457   CALCIUM 9.6 06/26/2016 1030   CALCIUM 8.9 05/01/2013 0457   Hepatic Function Latest Ref Rng & Units 06/26/2016 09/01/2015 08/19/2014  Total Protein 6.0 - 8.3 g/dL 5.9(L) 6.5 6.7  Albumin 3.5 - 5.2 g/dL 3.7 3.9 3.7  AST 0 - 37 U/L _0 ALT 0 - 53 U/L _1 Alk Phosphatase 39 - 117 U/L 58 56 102  Total Bilirubin 0.2 - 1.2 mg/dL 0.3 0.4 0.4  Bilirubin, Direct 0.0 - 0.3 mg/dL 0.1 0.0 0.1    Lab Results  Component Value Date   TSH 0.846 04/27/2013    Assessment and Plan:   Healthcare maintenance  Health Maintenance Exam: The patient's preventative maintenance and recommended screening tests for an annual wellness exam were reviewed in full today. Brought up to date unless services declined.  Counselled on the importance of diet, exercise, and its role in overall health and mortality. The patient's FH and SH was reviewed, including their home life, tobacco status, and drug and alcohol status.  Follow-up in 1 year for physical exam or additional follow-up below.  I have personally reviewed the Medicare Annual Wellness questionnaire and have noted 1. The patient's medical and social history 2. Their use of alcohol, tobacco or illicit drugs 3. Their current medications and supplements 4. The patient's functional ability including ADL's, fall risks, home safety risks and hearing or visual             impairment. 5. Diet and physical  activities 6. Evidence for depression or mood disorders 7. Reviewed Updated provider list, see scanned forms and [facility] Snapshot.   The patients weight, height, BMI and visual acuity have been recorded in the chart I have made referrals, counseling and provided education to the patient  based review of the above and I have provided the pt with a written personalized care plan for preventive services.  I have provided the patient with a copy of your personalized plan for preventive services. Instructed to take the time to review along with their updated medication list.  Follow-up: No Follow-up on file. Or follow-up in 1 year if not noted.  Meds ordered this encounter  Medications  . doxazosin (CARDURA) 8 MG tablet    Sig: Take 4 mg by mouth daily.  [name] aspirin 81 MG tablet    Sig: Take 81 mg by mouth daily.  [name] atorvastatin (LIPITOR) 40 MG tablet    Sig: Take 40 mg by mouth daily.  . metoprolol (TOPROL-XL) 200 MG 24 hr tablet    Sig: Take 100 mg by mouth daily.  . Multiple Vitamins-Minerals (CENTRUM SILVER 50+MEN) TABS    Sig: Take 1 tablet by mouth daily.  [name] glipiZIDE (GLUCOTROL XL) 10 MG 24 hr tablet    Sig: Take 10 mg by mouth 2 (two) times daily.  . traZODone (DESYREL) 50 MG tablet    Sig: Take 1-2 tablets (50-100 mg total) by mouth at bedtime as needed for sleep.    Dispense:  60 tablet    Refill:  3   Medications Discontinued During This Encounter  Medication Reason  . cephALEXin (KEFLEX) 500 MG capsule Completed Course  . doxazosin (CARDURA) 4 MG tablet Change in therapy  . atorvastatin (LIPITOR) 80 MG tablet Change in therapy  . metoprolol succinate (TOPROL-XL) 50 MG 24 hr tablet Change in therapy  . GLIPIZIDE XL 10 MG 24 hr tablet Change in therapy  . Cholecalciferol (VITAMIN D-3 PO) Completed Course  . isosorbide mononitrate (IMDUR) 30 MG 24 hr tablet Discontinued by provider   Signed,  Maud Deed. Jodie Cavey, MD   Allergies as of 07/03/2016      Reactions   Metformin  And Related    Tramadol Hcl    Tremor vs seizure activity   Ace Inhibitors    REACTION: cough   Atorvastatin    REACTION: weakness   Codeine Nausea And Vomiting   Morphine Sulfate    REACTION: unspecified   Ticlopidine Hcl    REACTION: unspecified   Warfarin Sodium Other (See Comments)   Nausea and vomiting       Medication List       Accurate as of 07/03/16 11:59 PM. Always use your most recent med list.          acetaminophen 325 MG tablet Commonly known as:  TYLENOL Take 650 mg by mouth at bedtime.   albuterol 108 (90 Base) MCG/ACT inhaler Commonly known as:  PROVENTIL HFA;VENTOLIN HFA Inhale 2 puffs into the lungs every 6 (six) hours as needed for wheezing or shortness of breath.   aspirin 81 MG tablet Take 81 mg by mouth daily.   atorvastatin 40 MG tablet Commonly known as:  LIPITOR Take 40 mg by mouth daily.   doxazosin 8 MG tablet Commonly known as:  CARDURA Take 4 mg by mouth daily.   furosemide 20 MG tablet Commonly known as:  LASIX Take 20 mg by mouth daily as needed for fluid.   glipiZIDE 10 MG 24 hr tablet Commonly known as:  GLUCOTROL XL Take 10 mg by mouth 2 (two) times daily.   metoprolol 200 MG 24 hr tablet Commonly known as:  TOPROL-XL Take 100 mg by mouth daily.   ONE TOUCH ULTRA TEST test strip Generic drug:  glucose  blood USE TO CHECK BLOOD SUGAR 2 TIMES DAILY AS DIRECTED   ONETOUCH DELICA LANCETS 14H Misc   PRESERVISION AREDS PO Take 1 tablet by mouth daily.   CENTRUM SILVER 50+MEN Tabs Take 1 tablet by mouth daily.   STIOLTO RESPIMAT 2.5-2.5 MCG/ACT Aers Generic drug:  Tiotropium Bromide-Olodaterol Inhale 2 puffs into the lungs every morning.   traZODone 50 MG tablet Commonly known as:  DESYREL Take 1-2 tablets (50-100 mg total) by mouth at bedtime as needed for sleep.

## 2016-07-03 NOTE — Progress Notes (Signed)
Pre visit review using our clinic review tool, if applicable. No additional management support is needed unless otherwise documented below in the visit note. 

## 2016-07-19 ENCOUNTER — Other Ambulatory Visit: Payer: Self-pay

## 2016-07-19 MED ORDER — GLIPIZIDE 5 MG PO TABS: 5.0000 mg | ORAL_TABLET | Freq: Two times a day (BID) | ORAL | 3 refills | Status: DC

## 2016-07-19 NOTE — Telephone Encounter (Signed)
Amy from [facility] pharmacy left a VM stating that patient is currently taking glipizide XL10mg  po once a day, she said patient had a change in his insurance and his copay for a 3 month supply went from $6.00 to $45.00 she said the regular release glipizide  is cheaper she talked to the patient and he's willing to take the regular release glipizide  BID. She would like to know if it's ok to change it? Thanks.

## 2016-07-31 ENCOUNTER — Encounter: Payer: Self-pay | Admitting: Family Medicine

## 2016-07-31 ENCOUNTER — Ambulatory Visit (INDEPENDENT_AMBULATORY_CARE_PROVIDER_SITE_OTHER): Payer: Medicare PPO | Admitting: Family Medicine

## 2016-07-31 DIAGNOSIS — J432 Centrilobular emphysema: Secondary | ICD-10-CM | POA: Diagnosis not present

## 2016-07-31 MED ORDER — PREDNISONE 20 MG PO TABS: ORAL_TABLET | ORAL | 0 refills | Status: DC

## 2016-07-31 MED ORDER — DOXYCYCLINE HYCLATE 100 MG PO TABS: 100.0000 mg | ORAL_TABLET | Freq: Two times a day (BID) | ORAL | 0 refills | Status: DC

## 2016-07-31 NOTE — Patient Instructions (Signed)
Start doxy and prednisone.  Take prednisone with food.  Rest and fluids.  Keep using your inhalers.  Take care.  Glad to see you.  Update [place] as needed.

## 2016-07-31 NOTE — Progress Notes (Signed)
Sx started about 3-4 days ago.  First noted tickle in his throat.  Sputum, yellow, with cough.  Fevers noted, up to 100.  Wheezing more.  h/o PNA in the past.  More SOB recently.  He didn't feel like needed to be at the hospital, d/w pt.  Dec in appetite, feels weaker in general.    Meds, vitals, and allergies reviewed.   ROS: Per HPI unless specifically indicated in ROS section   GEN: nad, alert and oriented HEENT: mucous membranes moist,OP with cobblestoning and purulent post nasal gtt noted.  NECK: supple w/o LA CV: rrr.   PULM: no inc wob, but diffuse scant wheeze and rhonchi noted bilaterally. No focal decrease in breath sounds. Speaking in complete sentences. EXT: no edema

## 2016-07-31 NOTE — Progress Notes (Signed)
Pre visit review using our clinic review tool, if applicable. No additional management support is needed unless otherwise documented below in the visit note. 

## 2016-08-01 NOTE — Assessment & Plan Note (Signed)
With likely exacerbation. Start prednisone and doxycycline. Continue inhalers. If worsening then go to the emergency room. At this point still okay for outpatient follow-up. He agrees with plan.

## 2016-08-29 ENCOUNTER — Emergency Department (HOSPITAL_COMMUNITY): Payer: Medicare PPO

## 2016-08-29 ENCOUNTER — Other Ambulatory Visit: Payer: Self-pay

## 2016-08-29 ENCOUNTER — Telehealth: Payer: Self-pay | Admitting: Cardiovascular Disease

## 2016-08-29 ENCOUNTER — Telehealth: Payer: Self-pay | Admitting: Family Medicine

## 2016-08-29 ENCOUNTER — Inpatient Hospital Stay (HOSPITAL_COMMUNITY)
Admission: EM | Admit: 2016-08-29 | Discharge: 2016-09-01 | DRG: 194 | Disposition: A | Discharge status: Home / Self Care | Payer: Medicare PPO | Attending: Internal Medicine | Admitting: Internal Medicine

## 2016-08-29 ENCOUNTER — Encounter (HOSPITAL_COMMUNITY): Payer: Self-pay | Admitting: Emergency Medicine

## 2016-08-29 DIAGNOSIS — I5032 Chronic diastolic (congestive) heart failure: Secondary | ICD-10-CM | POA: Diagnosis present

## 2016-08-29 DIAGNOSIS — R0609 Other forms of dyspnea: Secondary | ICD-10-CM

## 2016-08-29 DIAGNOSIS — T380X5A Adverse effect of glucocorticoids and synthetic analogues, initial encounter: Secondary | ICD-10-CM | POA: Diagnosis present

## 2016-08-29 DIAGNOSIS — Z7984 Long term (current) use of oral hypoglycemic drugs: Secondary | ICD-10-CM | POA: Diagnosis not present

## 2016-08-29 DIAGNOSIS — E1122 Type 2 diabetes mellitus with diabetic chronic kidney disease: Secondary | ICD-10-CM | POA: Diagnosis not present

## 2016-08-29 DIAGNOSIS — I25810 Atherosclerosis of coronary artery bypass graft(s) without angina pectoris: Secondary | ICD-10-CM | POA: Diagnosis not present

## 2016-08-29 DIAGNOSIS — I10 Essential (primary) hypertension: Secondary | ICD-10-CM | POA: Diagnosis not present

## 2016-08-29 DIAGNOSIS — Z79899 Other long term (current) drug therapy: Secondary | ICD-10-CM

## 2016-08-29 DIAGNOSIS — Z7982 Long term (current) use of aspirin: Secondary | ICD-10-CM | POA: Diagnosis not present

## 2016-08-29 DIAGNOSIS — Z955 Presence of coronary angioplasty implant and graft: Secondary | ICD-10-CM | POA: Diagnosis not present

## 2016-08-29 DIAGNOSIS — Z951 Presence of aortocoronary bypass graft: Secondary | ICD-10-CM | POA: Diagnosis not present

## 2016-08-29 DIAGNOSIS — J189 Pneumonia, unspecified organism: Secondary | ICD-10-CM | POA: Diagnosis not present

## 2016-08-29 DIAGNOSIS — I2510 Atherosclerotic heart disease of native coronary artery without angina pectoris: Secondary | ICD-10-CM | POA: Diagnosis present

## 2016-08-29 DIAGNOSIS — R0602 Shortness of breath: Secondary | ICD-10-CM | POA: Diagnosis not present

## 2016-08-29 DIAGNOSIS — N400 Enlarged prostate without lower urinary tract symptoms: Secondary | ICD-10-CM | POA: Diagnosis present

## 2016-08-29 DIAGNOSIS — R05 Cough: Secondary | ICD-10-CM | POA: Diagnosis not present

## 2016-08-29 DIAGNOSIS — H9190 Unspecified hearing loss, unspecified ear: Secondary | ICD-10-CM | POA: Diagnosis present

## 2016-08-29 DIAGNOSIS — J441 Chronic obstructive pulmonary disease with (acute) exacerbation: Secondary | ICD-10-CM | POA: Diagnosis not present

## 2016-08-29 DIAGNOSIS — I5030 Unspecified diastolic (congestive) heart failure: Secondary | ICD-10-CM | POA: Diagnosis not present

## 2016-08-29 DIAGNOSIS — D649 Anemia, unspecified: Secondary | ICD-10-CM | POA: Diagnosis present

## 2016-08-29 DIAGNOSIS — I130 Hypertensive heart and chronic kidney disease with heart failure and stage 1 through stage 4 chronic kidney disease, or unspecified chronic kidney disease: Secondary | ICD-10-CM | POA: Diagnosis present

## 2016-08-29 DIAGNOSIS — F0390 Unspecified dementia without behavioral disturbance: Secondary | ICD-10-CM | POA: Diagnosis present

## 2016-08-29 DIAGNOSIS — J432 Centrilobular emphysema: Secondary | ICD-10-CM | POA: Diagnosis not present

## 2016-08-29 DIAGNOSIS — J440 Chronic obstructive pulmonary disease with acute lower respiratory infection: Secondary | ICD-10-CM | POA: Diagnosis present

## 2016-08-29 DIAGNOSIS — N183 Chronic kidney disease, stage 3 unspecified: Secondary | ICD-10-CM | POA: Diagnosis present

## 2016-08-29 DIAGNOSIS — E1165 Type 2 diabetes mellitus with hyperglycemia: Secondary | ICD-10-CM | POA: Diagnosis present

## 2016-08-29 DIAGNOSIS — E1121 Type 2 diabetes mellitus with diabetic nephropathy: Secondary | ICD-10-CM | POA: Diagnosis present

## 2016-08-29 DIAGNOSIS — Z87891 Personal history of nicotine dependence: Secondary | ICD-10-CM

## 2016-08-29 DIAGNOSIS — E875 Hyperkalemia: Secondary | ICD-10-CM | POA: Diagnosis not present

## 2016-08-29 DIAGNOSIS — Z9109 Other allergy status, other than to drugs and biological substances: Secondary | ICD-10-CM | POA: Diagnosis not present

## 2016-08-29 LAB — CBC
HEMATOCRIT: 38.3 % — AB (ref 39.0–52.0)
HEMOGLOBIN: 12 g/dL — AB (ref 13.0–17.0)
MCH: 29.8 pg (ref 26.0–34.0)
MCHC: 31.3 g/dL (ref 30.0–36.0)
MCV: 95 fL (ref 78.0–100.0)
Platelets: 280 10*3/uL (ref 150–400)
RBC: 4.03 MIL/uL — AB (ref 4.22–5.81)
RDW: 13.1 % (ref 11.5–15.5)
WBC: 6.5 10*3/uL (ref 4.0–10.5)

## 2016-08-29 LAB — I-STAT TROPONIN, ED
TROPONIN I, POC: 0 ng/mL (ref 0.00–0.08)
TROPONIN I, POC: 0.01 ng/mL (ref 0.00–0.08)

## 2016-08-29 LAB — HEPATIC FUNCTION PANEL
ALBUMIN: 3.1 g/dL — AB (ref 3.5–5.0)
ALK PHOS: 71 U/L (ref 38–126)
ALT: 14 U/L — ABNORMAL LOW (ref 17–63)
AST: 16 U/L (ref 15–41)
BILIRUBIN TOTAL: 0.7 mg/dL (ref 0.3–1.2)
Bilirubin, Direct: <0.1 mg/dL — ABNORMAL LOW (ref 0.1–0.5)
Total Protein: 6.8 g/dL (ref 6.5–8.1)

## 2016-08-29 LAB — BASIC METABOLIC PANEL
ANION GAP: 10 (ref 5–15)
BUN: 21 mg/dL — ABNORMAL HIGH (ref 6–20)
CALCIUM: 9.4 mg/dL (ref 8.9–10.3)
CHLORIDE: 103 mmol/L (ref 101–111)
CO2: 24 mmol/L (ref 22–32)
Creatinine, Ser: 1.49 mg/dL — ABNORMAL HIGH (ref 0.61–1.24)
GFR calc non Af Amer: 41 mL/min — ABNORMAL LOW (ref >60)
GFR, EST AFRICAN AMERICAN: 47 mL/min — AB (ref >60)
GLUCOSE: 220 mg/dL — AB (ref 65–99)
POTASSIUM: 5.6 mmol/L — AB (ref 3.5–5.1)
Sodium: 137 mmol/L (ref 135–145)

## 2016-08-29 LAB — GLUCOSE, CAPILLARY: Glucose-Capillary: 364 mg/dL — ABNORMAL HIGH (ref 65–99)

## 2016-08-29 LAB — BRAIN NATRIURETIC PEPTIDE: B Natriuretic Peptide: 299.5 pg/mL — ABNORMAL HIGH (ref 0.0–100.0)

## 2016-08-29 LAB — POTASSIUM
POTASSIUM: 5.1 mmol/L (ref 3.5–5.1)
Potassium: 5.7 mmol/L — ABNORMAL HIGH (ref 3.5–5.1)

## 2016-08-29 MED ORDER — FUROSEMIDE 10 MG/ML IJ SOLN
20.0000 mg | Freq: Once | INTRAMUSCULAR | Status: AC
  Administered 2016-08-29: 20 mg via INTRAVENOUS
  Filled 2016-08-29: qty 2

## 2016-08-29 MED ORDER — DEXTROSE 5 % IV SOLN
1.0000 g | Freq: Once | INTRAVENOUS | Status: AC
  Administered 2016-08-29: 1 g via INTRAVENOUS
  Filled 2016-08-29: qty 10

## 2016-08-29 MED ORDER — SODIUM CHLORIDE 0.9% FLUSH: 3.0000 mL | INTRAVENOUS | Status: DC | PRN

## 2016-08-29 MED ORDER — INSULIN ASPART 100 UNIT/ML ~~LOC~~ SOLN
3.0000 [IU] | Freq: Three times a day (TID) | SUBCUTANEOUS | Status: DC
  Administered 2016-08-30 – 2016-09-01 (×7): 3 [IU] via SUBCUTANEOUS

## 2016-08-29 MED ORDER — ATORVASTATIN CALCIUM 40 MG PO TABS
40.0000 mg | ORAL_TABLET | Freq: Every day | ORAL | Status: DC
  Administered 2016-08-30 – 2016-08-31 (×2): 40 mg via ORAL
  Filled 2016-08-29 (×2): qty 1

## 2016-08-29 MED ORDER — INSULIN ASPART 100 UNIT/ML ~~LOC~~ SOLN
0.0000 [IU] | Freq: Three times a day (TID) | SUBCUTANEOUS | Status: DC
  Administered 2016-08-30: 11 [IU] via SUBCUTANEOUS
  Administered 2016-08-30: 15 [IU] via SUBCUTANEOUS
  Administered 2016-08-30: 8 [IU] via SUBCUTANEOUS
  Administered 2016-08-31 – 2016-09-01 (×4): 11 [IU] via SUBCUTANEOUS

## 2016-08-29 MED ORDER — METHYLPREDNISOLONE SODIUM SUCC 125 MG IJ SOLR
60.0000 mg | Freq: Four times a day (QID) | INTRAMUSCULAR | Status: DC
  Administered 2016-08-30 – 2016-08-31 (×5): 60 mg via INTRAVENOUS
  Filled 2016-08-29 (×5): qty 2

## 2016-08-29 MED ORDER — INSULIN ASPART 100 UNIT/ML ~~LOC~~ SOLN
0.0000 [IU] | Freq: Every day | SUBCUTANEOUS | Status: DC
  Administered 2016-08-29 – 2016-08-31 (×3): 5 [IU] via SUBCUTANEOUS

## 2016-08-29 MED ORDER — DEXTROSE 5 % IV SOLN
500.0000 mg | Freq: Once | INTRAVENOUS | Status: AC
  Administered 2016-08-29: 500 mg via INTRAVENOUS
  Filled 2016-08-29: qty 500

## 2016-08-29 MED ORDER — DEXTROSE 5 % IV SOLN
1.0000 g | INTRAVENOUS | Status: DC
  Administered 2016-08-30 – 2016-08-31 (×2): 1 g via INTRAVENOUS
  Filled 2016-08-29 (×3): qty 10

## 2016-08-29 MED ORDER — IPRATROPIUM-ALBUTEROL 0.5-2.5 (3) MG/3ML IN SOLN
3.0000 mL | Freq: Four times a day (QID) | RESPIRATORY_TRACT | Status: DC
  Administered 2016-08-29: 3 mL via RESPIRATORY_TRACT
  Filled 2016-08-29: qty 3

## 2016-08-29 MED ORDER — SODIUM CHLORIDE 0.9 % IV SOLN: 250.0000 mL | INTRAVENOUS | Status: DC | PRN

## 2016-08-29 MED ORDER — IPRATROPIUM-ALBUTEROL 0.5-2.5 (3) MG/3ML IN SOLN
3.0000 mL | Freq: Three times a day (TID) | RESPIRATORY_TRACT | Status: DC
  Administered 2016-08-30 – 2016-08-31 (×3): 3 mL via RESPIRATORY_TRACT
  Filled 2016-08-29 (×4): qty 3

## 2016-08-29 MED ORDER — IPRATROPIUM-ALBUTEROL 0.5-2.5 (3) MG/3ML IN SOLN: 3.0000 mL | RESPIRATORY_TRACT | Status: DC | PRN

## 2016-08-29 MED ORDER — ASPIRIN EC 81 MG PO TBEC
81.0000 mg | DELAYED_RELEASE_TABLET | Freq: Every day | ORAL | Status: DC
  Administered 2016-08-30 – 2016-09-01 (×3): 81 mg via ORAL
  Filled 2016-08-29 (×3): qty 1

## 2016-08-29 MED ORDER — METHYLPREDNISOLONE SODIUM SUCC 125 MG IJ SOLR
125.0000 mg | Freq: Once | INTRAMUSCULAR | Status: AC
  Administered 2016-08-29: 125 mg via INTRAVENOUS
  Filled 2016-08-29: qty 2

## 2016-08-29 MED ORDER — SODIUM CHLORIDE 0.9% FLUSH
3.0000 mL | Freq: Two times a day (BID) | INTRAVENOUS | Status: DC
  Administered 2016-08-29 – 2016-08-30 (×2): 3 mL via INTRAVENOUS

## 2016-08-29 MED ORDER — SODIUM CHLORIDE 0.9 % IV BOLUS (SEPSIS)
1000.0000 mL | Freq: Once | INTRAVENOUS | Status: AC
  Administered 2016-08-29: 1000 mL via INTRAVENOUS

## 2016-08-29 MED ORDER — LABETALOL HCL 5 MG/ML IV SOLN: 5.0000 mg | INTRAVENOUS | Status: DC | PRN

## 2016-08-29 MED ORDER — ADULT MULTIVITAMIN W/MINERALS CH
1.0000 | ORAL_TABLET | Freq: Every day | ORAL | Status: DC
  Administered 2016-08-30 – 2016-09-01 (×3): 1 via ORAL
  Filled 2016-08-29 (×4): qty 1

## 2016-08-29 MED ORDER — GABAPENTIN 300 MG PO CAPS
300.0000 mg | ORAL_CAPSULE | Freq: Every day | ORAL | Status: DC
  Administered 2016-08-29 – 2016-08-31 (×3): 300 mg via ORAL
  Filled 2016-08-29 (×3): qty 1

## 2016-08-29 MED ORDER — DOXAZOSIN MESYLATE 4 MG PO TABS
4.0000 mg | ORAL_TABLET | Freq: Every day | ORAL | Status: DC
  Administered 2016-08-30 – 2016-09-01 (×3): 4 mg via ORAL
  Filled 2016-08-29 (×3): qty 1

## 2016-08-29 MED ORDER — HEPARIN SODIUM (PORCINE) 5000 UNIT/ML IJ SOLN
5000.0000 [IU] | Freq: Three times a day (TID) | INTRAMUSCULAR | Status: DC
  Administered 2016-08-30 – 2016-09-01 (×7): 5000 [IU] via SUBCUTANEOUS
  Filled 2016-08-29 (×8): qty 1

## 2016-08-29 MED ORDER — DEXTROSE 5 % IV SOLN
500.0000 mg | INTRAVENOUS | Status: DC
  Administered 2016-08-30: 500 mg via INTRAVENOUS
  Filled 2016-08-29: qty 500

## 2016-08-29 MED ORDER — METOPROLOL SUCCINATE ER 100 MG PO TB24
100.0000 mg | ORAL_TABLET | Freq: Every day | ORAL | Status: DC
  Administered 2016-08-30 – 2016-09-01 (×3): 100 mg via ORAL
  Filled 2016-08-29 (×3): qty 1

## 2016-08-29 NOTE — Telephone Encounter (Signed)
Pt [facility] calling [facility]  She is stating pt is having some really bad SOB  They have advised patient to go to ED but he refuses to go there states he is afraid of that.   Please advise.

## 2016-08-29 NOTE — Telephone Encounter (Signed)
Patient Name: Timothy Cordova  DOB: 1929/03/15    Initial Comment Caller states has COPD, having trouble breathing   Nurse Assessment  Nurse: Leveda AnnaHensel, RN, Aeriel Date/Time (Eastern Time): 08/29/2016 8:54:45 AM  Confirm and document reason for call. If symptomatic, describe symptoms. ---Caller states has COPD, having trouble breathing. Caller states, he had the same problem a few months ago and he needed some steroids. Caller states, pt has a cough and he is really short of breathe. CAller states, pt is coughing up stuff when he coughs.  Does the patient have any new or worsening symptoms? ---Yes  Will a triage be completed? ---Yes  Related visit to physician within the last 2 weeks? ---N/A  Does the PT have any chronic conditions? (i.e. diabetes, asthma, etc.) ---Yes  List chronic conditions. ---COPD and emphysema  Is this a behavioral health or substance abuse call? ---No     Guidelines    Guideline Title Affirmed Question Affirmed Notes  Cough - Acute Productive Severe difficulty breathing (e.g., struggling for each breath, speaks in single words)    Final Disposition User   Call EMS 911 Now Hensel, RN, Aeriel    Comments  Caller states, pt has already used his rescue inhaler for his COPD and has taken his medication. Nurse contacted backline and informed Reena of pt refusal to call 911.   Disagree/Comply: Disagree  Disagree/Comply Reason: Disagree with instructions

## 2016-08-29 NOTE — Telephone Encounter (Signed)
I spoke with pts wife (DPR signed) pt has been outside a lot the last few days and with the heat and allergies breathing is a lot worse; last yr this time pt had pneumonia. Mrs Timothy Cordova said pt would agree to [facility] to [facility] [place] to be evaluated and then if needed would go to ED. Going to UC now. FYI to Dr Ermalene SearingBedsole.

## 2016-08-29 NOTE — H&P (Signed)
History and Physical    Timothy Postinhomas O Kilts BJY:[id number] DOB: [birth year] DOA: 08/29/2016  PCP: Hannah Beatopland, [place], MD   Patient coming from: Home, by way of urgent care  Chief Complaint: SOB, cough  HPI: Timothy Cordova is a 83 y.o. male with medical history significant for COPD, coronary artery disease, chronic diastolic CHF, chronic kidney disease stage III, hypertension, and type 2 diabetes mellitus, now presenting to the emergency department for evaluation of progressive exertional dyspnea and cough. Patient notes that the current symptoms began over a month ago, he saw his PCP on 07/31/2016, was diagnosed with exacerbation in COPD and prescribed doxycycline and prednisone. He reports significant improvement with this treatment, however, he began to worsen again shortly after completing the course. He now reports recurrent dyspnea with minimal exertion and cough productive of thick sputum. This has steadily worsened since he completed the antibiotic and steroid 3 weeks ago. Over the past 2 days, he reports symptoms at rest and new inability to ambulate across a room due to profound dyspnea. He denies fevers or chills, denies chest pain or palpitations, and denies any significant orthopnea or leg edema. There has not been any recent long distance travel or sick contacts. He tried to get in to see his PCP for this today, but was directed to the urgent care, and from there was advised to seek further evaluation in the ED.   ED Course: Upon arrival to the ED, patient is found to be afebrile, saturating adequately on room air, tachypneic, and mildly hypertensive. EKG features a sinus rhythm with first-degree AV block, PVCs, PA-C, LAD, and RBBB. Chest x-ray features of ill-defined basilar opacities concerning for developing infiltrates. Chemistry panel reveals a potassium of 5.6, glucose of 220, and serum creatinine 1.49, consistent with his baseline creatinine. CBC is notable for a stable normocytic  anemia with hemoglobin of 12.0. Troponin was undetectable, and then within the normal limits at 0.01 more than 3 hours later. Patient was treated with Rocephin, azithromycin, and 1 L of normal saline in the ED. He remained dyspneic at rest, but hemodynamically stable, and will be admitted to the telemetry unit for ongoing evaluation and management of acute on chronic respiratory distress with acute exacerbation and COPD and community-acquired pneumonia.  Review of Systems:  All other systems reviewed and apart from HPI, are negative.  Past Medical History:  Diagnosis Date  . Allergic rhinitis   . Benign prostatic hypertrophy   . Chronic diastolic heart failure (HCC)    8/08 ECHO with EF 55%  . CKD (chronic kidney disease)   . COPD (chronic obstructive pulmonary disease) (HCC)    moderate. followed by Dr.Byrum  . Coronary artery disease    s/p CABG 2006. LHC (1/10): SVG-OM and PLOM, SVG-D, and LIMA-LAD were all patent  . Dementia    (mild)--06/2006  . Diabetes mellitus   . Edema    localized. suspect diastolic CHF + venous insufficiency  . Hearing loss    left >>right  . History of tobacco abuse   . Hypercholesterolemia   . Hypertension   . Mixed hyperlipidemia   . Osteoarthritis   . Sleep apnea    ?    Past Surgical History:  Procedure Laterality Date  . ANGIOPLASTY  [id number]  . [place]  . CARDIAC CATHETERIZATION     [facility];x2 stents  . CARDIAC CATHETERIZATION  04/29/2013  . CORONARY ARTERY BYPASS GRAFT  02/2005  . ROTATOR CUFF REPAIR  2005   left, [place]     reports that he quit smoking about 11 years ago. His smoking use included Cigarettes. He has a 180.00 pack-year smoking history. He has never used smokeless tobacco. He reports that he does not drink alcohol or use drugs.  Allergies  Allergen Reactions  . Metformin And Related Nausea And Vomiting  . Tramadol Hcl     Tremor vs seizure activity  . Ace Inhibitors      REACTION: cough  . Atorvastatin     REACTION: weakness. ( currently on this med at home)  . Codeine Nausea And Vomiting  . Morphine Sulfate Nausea And Vomiting    REACTION: unspecified  . Ticlopidine Hcl     REACTION: unspecified  . Warfarin Sodium Other (See Comments)    Nausea and vomiting     Family History  Problem Relation Age of Onset  . Heart attack Mother   . Lung cancer Sister      Prior to Admission medications   Medication Sig Start Date End Date Taking? Authorizing Provider  acetaminophen (TYLENOL) 325 MG tablet Take 650 mg by mouth at bedtime.   Yes [provider]  albuterol (PROVENTIL HFA;VENTOLIN HFA) 108 (90 Base) MCG/ACT inhaler Inhale 2 puffs into the lungs every 6 (six) hours as needed for wheezing or shortness of breath. 10/09/15  Yes Short, Thea Silversmith, MD  aspirin 81 MG tablet Take 81 mg by mouth daily.   Yes [provider]  atorvastatin (LIPITOR) 40 MG tablet Take 40 mg by mouth daily.   Yes [provider]  doxazosin (CARDURA) 8 MG tablet Take 4 mg by mouth daily.   Yes [provider]  furosemide (LASIX) 20 MG tablet Take 20 mg by mouth daily as needed for fluid.    Yes [provider]  gabapentin (NEURONTIN) 300 MG capsule Take 300 mg by mouth at bedtime.   Yes [provider]  glipiZIDE (GLUCOTROL) 5 MG tablet Take 1 tablet (5 mg total) by mouth 2 (two) times daily before a meal. 07/19/16  Yes Copland, Karleen Hampshire, MD  metoprolol (TOPROL-XL) 200 MG 24 hr tablet Take 100 mg by mouth daily.   Yes [provider]  Multiple Vitamins-Minerals (CENTRUM SILVER 50+MEN) TABS Take 1 tablet by mouth daily.   Yes [provider]  Multiple Vitamins-Minerals (PRESERVISION AREDS PO) Take 1 tablet by mouth daily.    Yes [provider]  ONE TOUCH ULTRA TEST test strip USE TO CHECK BLOOD SUGAR 2 TIMES DAILY AS DIRECTED 09/08/15  Yes Copland, Karleen Hampshire, MD  [facility] DELICA LANCETS 33G MISC  02/13/13  Yes  [provider]  Tiotropium Bromide-Olodaterol (STIOLTO RESPIMAT) 2.5-2.5 MCG/ACT AERS Inhale 2 puffs into the lungs every morning.    Yes [provider]  traZODone (DESYREL) 50 MG tablet Take 1-2 tablets (50-100 mg total) by mouth at bedtime as needed for sleep. Patient not taking: Reported on 08/29/2016 07/03/16   Hannah Beat, MD    Physical Exam: Vitals:   08/29/16 1645 08/29/16 1700 08/29/16 1730 08/29/16 1800  BP: (!) 187/59 (!) 182/64 (!) 181/78 (!) 164/77  Pulse: 79 (!) 39  83  Resp: (!) 24 (!) 22 (!) 21   Temp:      TempSrc:      SpO2: 99% 98% 100% 98%      Constitutional: Not in acute distress, but dyspneic with speech. No pallor or diaphoresis.  Eyes: PERTLA, lids and conjunctivae normal ENMT: Mucous membranes are moist. Posterior pharynx  clear of any exudate or lesions.   Neck: normal, supple, no masses, no thyromegaly Respiratory: mildly diminished breath sounds bilaterally. Coarse rales in bases b/l. Wheezes diffusely. Mild tachypnea. No accessory muscle use.  Cardiovascular: S1 & S2 heard, regular rate and rhythm. 1+ pretibial edema bilaterally. JVP 7 cmH2O. Abdomen: No distension, no tenderness, no masses palpated. Bowel sounds normal.  Musculoskeletal: no clubbing / cyanosis. No joint deformity upper and lower extremities.  Skin: no significant rashes, lesions, ulcers. Warm, dry, well-perfused. Neurologic: CN 2-12 grossly intact. Sensation intact, DTR normal. Strength 5/5 in all 4 limbs.  Psychiatric: Alert and oriented x 3. Calm and cooperative.     Labs on Admission: I have personally reviewed following labs and imaging studies  CBC:  Recent Labs Lab 08/29/16 1330  WBC 6.5  HGB 12.0*  HCT 38.3*  MCV 95.0  PLT 280   Basic Metabolic Panel:  Recent Labs Lab 08/29/16 1330 08/29/16 1745  NA 137  --   K 5.6* 5.7*  CL 103  --   CO2 24  --   GLUCOSE 220*  --   BUN 21*  --   CREATININE 1.49*  --   CALCIUM 9.4  --     GFR: CrCl cannot be calculated (Unknown ideal weight.). Liver Function Tests:  Recent Labs Lab 08/29/16 1745  AST 16  ALT 14*  ALKPHOS 71  BILITOT 0.7  PROT 6.8  ALBUMIN 3.1*   No results for input(s): LIPASE, AMYLASE in the last 168 hours. No results for input(s): AMMONIA in the last 168 hours. Coagulation Profile: No results for input(s): INR, PROTIME in the last 168 hours. Cardiac Enzymes: No results for input(s): CKTOTAL, CKMB, CKMBINDEX, TROPONINI in the last 168 hours. BNP (last 3 results) No results for input(s): PROBNP in the last 8760 hours. HbA1C: No results for input(s): HGBA1C in the last 72 hours. CBG: No results for input(s): GLUCAP in the last 168 hours. Lipid Profile: No results for input(s): CHOL, HDL, LDLCALC, TRIG, CHOLHDL, LDLDIRECT in the last 72 hours. Thyroid Function Tests: No results for input(s): TSH, T4TOTAL, FREET4, T3FREE, THYROIDAB in the last 72 hours. Anemia Panel: No results for input(s): VITAMINB12, FOLATE, FERRITIN, TIBC, IRON, RETICCTPCT in the last 72 hours. Urine analysis:    Component Value Date/Time   COLORURINE YELLOW 10/07/2015 0105   APPEARANCEUR CLEAR 10/07/2015 0105   LABSPEC 1.037 (H) 10/07/2015 0105   PHURINE 5.5 10/07/2015 0105   GLUCOSEU NEGATIVE 10/07/2015 0105   HGBUR NEGATIVE 10/07/2015 0105   BILIRUBINUR NEGATIVE 10/07/2015 0105   KETONESUR NEGATIVE 10/07/2015 0105   PROTEINUR 100 (A) 10/07/2015 0105   NITRITE NEGATIVE 10/07/2015 0105   LEUKOCYTESUR NEGATIVE 10/07/2015 0105   Sepsis Labs: @LABRCNTIP (procalcitonin:4,lacticidven:4) )No results found for this or any previous visit (from the past 240 hour(s)).   Radiological Exams on Admission: Dg Chest 2 View  Result Date: 08/29/2016 CLINICAL DATA:  Shortness of breath, cough EXAM: CHEST  2 VIEW COMPARISON:  12/10/2015, 10/27/2015 FINDINGS: Post sternotomy changes. Ill-defined bibasilar opacity. No pleural effusion stable borderline to mild cardiomegaly with  atherosclerosis. Possible tiny pleural fluid on lateral view. Degenerative changes of the spine peer postsurgical changes of the left humeral head. IMPRESSION: 1. Ill-defined bibasilar opacities may reflect mild infiltrates. Possible tiny pleural fluid on lateral image. 2. Stable borderline cardiomegaly without edema Electronically Signed   By: Jasmine Pang M.D.   On: 08/29/2016 14:48    EKG: Independently reviewed. Sinus rhythm, 1st deg AV block, PVC's, PAC, LAD, RBBB.  Assessment/Plan  1. Community-acquired PNA  - Pt presents with progressive dyspnea and productive cough, found to have infiltrates in bilateral bases on CXR  - Treated with empiric Rocephin and azithromycin in ED - Not hypoxic at rest, but desaturates with mild exertion and has significant underlying cardiopulmonary disease  - Plan to check sputum gram-stain and culture, strep pneumo antigen, and continue current abx   2. COPD with acute exacerbation  - Pt presents with progressive dyspnea and productive cough, found to have CAP with wheezing and obstructive breathing  - Check sputum culture, start systemic steroid, continue Duoneb q6h and q2h prn, continue supplemental O2, antibiotics as above    3. Hyperkalemia  - Serum potassium 5.6 on arrival, 5.7 on repeat  - EKG with frequent PVC's, PAC's  - He was given a liter on NS in ED, appears hypervolemic and will be given Lasix 20 mg IV now; continue neb treatments as above  - Monitor on telemetry, follow serial potassium levels until normalized; consider kayexalate if fails to improve with Lasix and nebs    4. CAD - No anginal complaints  - Troponin negative x2  - EKG with frequent PVC's, PAC  - Continue telemetry monitoring until potassium normalizes - Continue Toprol, statin, ASA 81   5. Chronic diastolic CHF  - Appears hypervolemic on admission  - TTE (04/27/13) with EF 50-55%, mild LAE, no significant valvular disease, impaired diastolic function  - He is managed  at home with beta-blocker and Lasix 20 mg prn edema  - He was given 1 liter of NS in ED for his hyperkalemia, but appears volume-up and is given 20 mg IV Lasix on admission  - SLIV, follow daily wts and I/O's, continue beta-blocker    6. CKD stage III  - SCr is 1.49 on admission, consistent with his apparent baseline  - He was given IV Lasix as above and chem panel will be repeated in am    7. Type II DM  - A1c was 7.8% in March 2018  - Managed at home with glipizide; held on admission  - Check CBG with meals and qHS - Start a moderate-intensity Novlog sliding-scale   8. Hypertension  - BP slightly elevated on admission  - Continue Toprol    DVT prophylaxis: sq heparin Code Status: Full  Family Communication: Discussed with patient Disposition Plan: Admit to telemetry Consults called: None Admission status: Inpatient    Briscoe Deutscher, MD [facility] Pager [phone]  If 7PM-7AM, please contact night-coverage [email or web address] Password [facility]  08/29/2016, 7:25 PM

## 2016-08-29 NOTE — Telephone Encounter (Signed)
Spoke w/ pt's wife.  She reports that pt is very SOB.  PCP did not have an opening, [facility] and advised to proceed to the ED, but he refuses to go - wants to come to our office instead. Advised her that if [facility] did not feel equipped to help him, he should proceed to the ED. Advised her that our office is a clinic and cannot treat him, only evaluate. She is agreeable to having her son who is w/ them take pt to ED now.  Asked her to call back if we can be of further assistance.

## 2016-08-29 NOTE — ED Notes (Signed)
[facility] called to inform this ED that the patient was on his way with c/o exertional dyspnea and SOB for 4 days. According to the PA at urgent care, pt checked in with SOB and left AMA because he did not want to be transferred to the [place]. He left AMA to come POV to this ED. According to Urgent Care, pt RR was 32, BP 194/78, O2 was 92% on RA and oxygen level decreased to 88% with ambulation. EKG was done and showed he was in bigeminy, PVCs, BBB and ST elevation in inferior leads. Pt was given 4 baby aspirins today while at Urgent Care.

## 2016-08-29 NOTE — ED Provider Notes (Signed)
[facility]-EMERGENCY DEPT Provider Note   CSN: [id number] Arrival date & time: 08/29/16  1317     History   Chief Complaint Chief Complaint  Patient presents with  . Shortness of Breath    HPI Timothy Cordova is a 83 y.o. male.  HPI   Timothy Cordova is a 83 y.o. male, with a history of cardiac stents, CABG, CKD, HTN, DM, and COPD, presenting to the ED with shortness of breath increasing over the past week.   Patient endorses increasing shortness of breath over the last year, but markedly worsened over the past week, especially exertional SOB. Also endorses worsening nonproductive cough over this time period.  Was seen at [facility] in [place] today, had exertional SOB with SPO2 of 84% on room air while ambulating and had bigeminy on EKG. Transfer to ED via EMS was recommended. Patient left AMA and chose to come to [facility] ED instead.  Denies fever/chills, CP, N/V/D, abdominal pain, orthopnea, or any other complaints.  Cardiology through the [place]. Denies history of PE/DVT, recent trauma, surgeries, or immobilization. No recent hospitalizations.    Past Medical History:  Diagnosis Date  . Allergic rhinitis   . Benign prostatic hypertrophy   . Chronic diastolic heart failure (HCC)    8/08 ECHO with EF 55%  . CKD (chronic kidney disease)   . COPD (chronic obstructive pulmonary disease) (HCC)    moderate. followed by Dr.Byrum  . Coronary artery disease    s/p CABG 2006. LHC (1/10): SVG-OM and PLOM, SVG-D, and LIMA-LAD were all patent  . Dementia    (mild)--06/2006  . Diabetes mellitus   . Edema    localized. suspect diastolic CHF + venous insufficiency  . Hearing loss    left >>right  . History of tobacco abuse   . Hypercholesterolemia   . Hypertension   . Mixed hyperlipidemia   . Osteoarthritis   . Sleep apnea    ?    Patient Active Problem List   Diagnosis Date Noted  . Hyperkalemia 08/29/2016  . Solitary pulmonary nodule 12/10/2015  . Acute on chronic diastolic heart  failure (HCC) 40/98/1191  . CAP (community acquired pneumonia) 10/06/2015  . Anemia 11/26/2014  . COPD with acute exacerbation (HCC) 05/21/2013  . Aortic calcification, 04/20/2005 CT Chest 04/03/2013  . Sleep apnea   . Tobacco abuse (in remission)   . Chronic low back pain 04/18/2011  . Renal lesion 03/27/2011  . CKD (chronic kidney disease) stage 3, GFR 30-59 ml/min 08/18/2010  . OSTEOARTHRITIS 12/06/2009  . DIASTOLIC HEART FAILURE, CHRONIC 08/18/2009  . Mixed hyperlipidemia 06/11/2008  . CAD, AUTOLOGOUS BYPASS GRAFT 06/07/2008  . DM (diabetes mellitus), type 2 with renal complications (HCC) 09/04/2007  . ALLERGIC RHINITIS 01/30/2007  . HEARING LOSS 07/06/2006  . Essential hypertension 07/06/2006  . BENIGN PROSTATIC HYPERTROPHY 07/06/2006  . Centrilobular emphysema (HCC) 08/08/2005    Past Surgical History:  Procedure Laterality Date  . ANGIOPLASTY  [id number]  . CARDIAC CATHETERIZATION  90s   [place], [place] x1stent  . CARDIAC CATHETERIZATION     [facility];x2 stents  . CARDIAC CATHETERIZATION  04/29/2013  . CORONARY ARTERY BYPASS GRAFT  02/2005  . ROTATOR CUFF REPAIR  2005   left, [place]re       Home Medications    Prior to Admission medications   Medication Sig Start Date End Date Taking? Authorizing Provider  acetaminophen (TYLENOL) 325 MG tablet Take 650 mg by mouth at bedtime.    [provider]  albuterol (PROVENTIL HFA;VENTOLIN  HFA) 108 (90 Base) MCG/ACT inhaler Inhale 2 puffs into the lungs every 6 (six) hours as needed for wheezing or shortness of breath. 10/09/15   Renae FickleShort, Mackenzie, MD  aspirin 81 MG tablet Take 81 mg by mouth daily.    [provider]  atorvastatin (LIPITOR) 40 MG tablet Take 40 mg by mouth daily.    [provider]  doxazosin (CARDURA) 8 MG tablet Take 4 mg by mouth daily.    [provider]  doxycycline (VIBRA-TABS) 100 MG tablet Take 1 tablet (100 mg total) by mouth 2 (two) times daily. 07/31/16   Joaquim Namuncan, Graham S, MD    furosemide (LASIX) 20 MG tablet Take 20 mg by mouth daily as needed for fluid.     [provider]  glipiZIDE (GLUCOTROL) 5 MG tablet Take 1 tablet (5 mg total) by mouth 2 (two) times daily before a meal. 07/19/16   Copland, Karleen HampshireSpencer, MD  metoprolol (TOPROL-XL) 200 MG 24 hr tablet Take 100 mg by mouth daily.    [provider]  Multiple Vitamins-Minerals (CENTRUM SILVER 50+MEN) TABS Take 1 tablet by mouth daily.    [provider]  Multiple Vitamins-Minerals (PRESERVISION AREDS PO) Take 1 tablet by mouth daily.     [provider]  ONE TOUCH ULTRA TEST test strip USE TO CHECK BLOOD SUGAR 2 TIMES DAILY AS DIRECTED 09/08/15   Copland, Karleen HampshireSpencer, MD  [facility] DEL[facility] LANCETS 33G MISC  02/13/13   [provider]  predniSONE (DELTASONE) 20 MG tablet 2 a day for 5 days, then 1 a day for 5 days.  With food. 07/31/16   Joaquim Namuncan, Graham S, MD  Tiotropium Bromide-Olodaterol (STIOLTO RESPIMAT) 2.5-2.5 MCG/ACT AERS Inhale 2 puffs into the lungs every morning.     [provider]  traZODone (DESYREL) 50 MG tablet Take 1-2 tablets (50-100 mg total) by mouth at bedtime as needed for sleep. 07/03/16   Hannah Beatopland, Spencer, MD    Family History Family History  Problem Relation Age of Onset  . Heart attack Mother   . Lung cancer Sister     Social History Social History  Substance Use Topics  . Smoking status: Former Smoker    Packs/day: 3.00    Years: 60.00    Types: Cigarettes    Quit date: 07/12/2005  . Smokeless tobacco: Never Used  . Alcohol use No     Allergies   Metformin and related; Tramadol hcl; Ace inhibitors; Atorvastatin; Codeine; Morphine sulfate; Ticlopidine hcl; and Warfarin sodium   Review of Systems Review of Systems  Constitutional: Negative for chills, diaphoresis and fever.  Respiratory: Positive for cough and shortness of breath.   Cardiovascular: Negative for chest pain and leg swelling.  Gastrointestinal: Negative for abdominal  pain, diarrhea, nausea and vomiting.  Neurological: Negative for dizziness, weakness, light-headedness and numbness.  All other systems reviewed and are negative.    Physical Exam Updated Vital Signs BP (!) 187/59 (BP Location: Right Arm)   Pulse 79   Temp 97.4 F (36.3 C) (Oral)   Resp (!) 24   SpO2 99%   Physical Exam  Constitutional: He appears well-developed and well-nourished. No distress.  HENT:  Head: Normocephalic and atraumatic.  Eyes: Conjunctivae are normal.  Neck: Neck supple.  Cardiovascular: Normal rate, regular rhythm, normal heart sounds and intact distal pulses.   Pulmonary/Chest:  Increased work of breath. Speaks in short phrases.   Abdominal: Soft. There is no tenderness. There is no guarding.  Musculoskeletal: He exhibits no edema.  Lymphadenopathy:    He has no cervical adenopathy.  Neurological: He is alert.  Skin: Skin is warm and dry. He is not diaphoretic.  Psychiatric: He has a normal mood and affect. His behavior is normal.  Nursing note and vitals reviewed.    ED Treatments / Results  Labs (all labs ordered are listed, but only abnormal results are displayed) Labs Reviewed  BASIC METABOLIC PANEL - Abnormal; Notable for the following:       Result Value   Potassium 5.6 (*)    Glucose, Bld 220 (*)    BUN 21 (*)    Creatinine, Ser 1.49 (*)    GFR calc non Af Amer 41 (*)    GFR calc Af Amer 47 (*)    All other components within normal limits  CBC - Abnormal; Notable for the following:    RBC 4.03 (*)    Hemoglobin 12.0 (*)    HCT 38.3 (*)    All other components within normal limits  HEPATIC FUNCTION PANEL - Abnormal; Notable for the following:    Albumin 3.1 (*)    ALT 14 (*)    Bilirubin, Direct <0.1 (*)    All other components within normal limits  POTASSIUM - Abnormal; Notable for the following:    Potassium 5.7 (*)    All other components within normal limits  BRAIN NATRIURETIC PEPTIDE  I-STAT TROPOININ, ED  I-STAT TROPOININ,  ED   BUN  Date Value Ref Range Status  08/29/2016 21 (H) 6 - 20 mg/dL Final  24/40/1027 44 (H) 6 - 23 mg/dL Final  25/36/6440 35 (H) 6 - 23 mg/dL Final  34/74/2595 28 (H) 6 - 20 mg/dL Final  63/87/5643 41 (H) 7 - 18 mg/dL Final  32/95/1884 43 (H) 7 - 18 mg/dL Final  16/60/6301 47 (H) 7 - 18 mg/dL Final  60/01/9322 47 (H) 7 - 18 mg/dL Final   Creatinine  Date Value Ref Range Status  05/01/2013 1.57 (H) 0.60 - 1.30 mg/dL Final  55/73/2202 5.42 (H) 0.60 - 1.30 mg/dL Final  70/62/3762 8.31 (H) 0.60 - 1.30 mg/dL Final  51/76/1607 3.71 (H) 0.60 - 1.30 mg/dL Final   Creatinine, Ser  Date Value Ref Range Status  08/29/2016 1.49 (H) 0.61 - 1.24 mg/dL Final  10/04/9483 4.62 (H) 0.40 - 1.50 mg/dL Final  70/35/0093 8.18 0.40 - 1.50 mg/dL Final  29/93/7169 6.78 (H) 0.61 - 1.24 mg/dL Final   Hemoglobin  Date Value Ref Range Status  08/29/2016 12.0 (L) 13.0 - 17.0 g/dL Final  93/81/0175 10.2 (L) 13.0 - 17.0 g/dL Final  58/52/7782 42.3 (L) 13.0 - 17.0 g/dL Final  53/61/4431 54.0 (L) 13.0 - 17.0 g/dL Final   HGB  Date Value Ref Range Status  04/28/2013 11.7 (L) 13.0 - 18.0 g/dL Final  08/67/6195 09.3 (L) 13.0 - 18.0 g/dL Final     EKG  EKG Interpretation  Date/Time:  Tuesday Aug 29 2016 13:27:15 EDT Ventricular Rate:  91 PR Interval:    QRS Duration: 134 QT Interval:  392 QTC Calculation: 482 R Axis:   -72 Text Interpretation:  Sinus rhythm with A-V dissociation and Wide QRS rhythm with Premature supraventricular complexes and with frequent Premature ventricular complexes Left axis deviation Right bundle branch block T wave abnormality, consider lateral ischemia Abnormal ECG Interpretation limited secondary to artifact Confirmed by Lincoln Brigham [id number]) on 08/29/2016 4:39:33 PM       EKG Interpretation  Date/Time:  Tuesday Aug 29 2016 13:27:15 EDT Ventricular Rate:  91 PR Interval:    QRS Duration: 134 QT Interval:  392 QTC Calculation: 482 R Axis:   -72 Text  Interpretation:  Sinus rhythm with A-V dissociation and Wide QRS rhythm with Premature supraventricular complexes and with frequent Premature ventricular complexes Left axis deviation Right bundle branch block T wave abnormality, consider lateral ischemia Abnormal ECG Interpretation limited secondary to artifact Confirmed by Lincoln Brigham [phone]) on 08/29/2016 4:39:33 PM       Radiology Dg Chest 2 View  Result Date: 08/29/2016 CLINICAL DATA:  Shortness of breath, cough EXAM: CHEST  2 VIEW COMPARISON:  12/10/2015, 10/27/2015 FINDINGS: Post sternotomy changes. Ill-defined bibasilar opacity. No pleural effusion stable borderline to mild cardiomegaly with atherosclerosis. Possible tiny pleural fluid on lateral view. Degenerative changes of the spine peer postsurgical changes of the left humeral head. IMPRESSION: 1. Ill-defined bibasilar opacities may reflect mild infiltrates. Possible tiny pleural fluid on lateral image. 2. Stable borderline cardiomegaly without edema Electronically Signed   By: Jasmine Pang M.D.   On: 08/29/2016 14:48    Procedures Procedures (including critical care time)  Medications Ordered in ED Medications  sodium chloride 0.9 % bolus 1,000 mL (1,000 mLs Intravenous New Bag/Given 08/29/16 1820)  cefTRIAXone (ROCEPHIN) 1 g in dextrose 5 % 50 mL IVPB (1 g Intravenous New Bag/Given 08/29/16 1819)  azithromycin (ZITHROMAX) 500 mg in dextrose 5 % 250 mL IVPB (not administered)     Initial Impression / Assessment and Plan / ED Course  I have reviewed the triage vital signs and the nursing notes.  Pertinent labs & imaging results that were available during my care of the patient were reviewed by me and considered in my medical decision making (see chart for details).  Clinical Course as of Aug 29 1825  Tue Aug 29, 2016  1824 Spoke with Dr. Antionette Char, hospitalist, who agreed to admit the patient.  [SJ]    Clinical Course User Index [SJ] Joy, Shawn C, PA-C     Patient presents with  exertional dyspnea. Evidence of possible pneumonia on chest x-ray. Admission for IV antibiotics and continued evaluation. Hyperkalemia confirmed with redraw, hospitalist to address.   Findings and plan of care discussed with Tilden Fossa, MD. Dr. Madilyn Hook personally evaluated and examined this patient.   Vitals:   08/29/16 1645 08/29/16 1700 08/29/16 1730 08/29/16 1800  BP: (!) 187/59 (!) 182/64 (!) 181/78 (!) 164/77  Pulse: 79 (!) 39  83  Resp: (!) 24 (!) 22 (!) 21   Temp:      TempSrc:      SpO2: 99% 98% 100% 98%   Bradycardic rate likely erroneous.  Final Clinical Impressions(s) / ED Diagnoses   Final diagnoses:  Dyspnea on exertion    New Prescriptions New Prescriptions   No medications on file     Concepcion Living 08/29/16 Shela Commons, MD 09/01/16 [phone]

## 2016-08-29 NOTE — ED Triage Notes (Signed)
Pt here for increased SOB over last several days

## 2016-08-29 NOTE — Telephone Encounter (Signed)
Noted  

## 2016-08-30 ENCOUNTER — Encounter (HOSPITAL_COMMUNITY): Payer: Self-pay | Admitting: *Deleted

## 2016-08-30 DIAGNOSIS — N183 Chronic kidney disease, stage 3 (moderate): Secondary | ICD-10-CM

## 2016-08-30 DIAGNOSIS — I5032 Chronic diastolic (congestive) heart failure: Secondary | ICD-10-CM

## 2016-08-30 DIAGNOSIS — J441 Chronic obstructive pulmonary disease with (acute) exacerbation: Secondary | ICD-10-CM

## 2016-08-30 DIAGNOSIS — J432 Centrilobular emphysema: Secondary | ICD-10-CM

## 2016-08-30 DIAGNOSIS — E1122 Type 2 diabetes mellitus with diabetic chronic kidney disease: Secondary | ICD-10-CM

## 2016-08-30 DIAGNOSIS — J189 Pneumonia, unspecified organism: Principal | ICD-10-CM

## 2016-08-30 LAB — BASIC METABOLIC PANEL
ANION GAP: 9 (ref 5–15)
BUN: 27 mg/dL — ABNORMAL HIGH (ref 6–20)
CALCIUM: 9.3 mg/dL (ref 8.9–10.3)
CO2: 24 mmol/L (ref 22–32)
Chloride: 103 mmol/L (ref 101–111)
Creatinine, Ser: 1.44 mg/dL — ABNORMAL HIGH (ref 0.61–1.24)
GFR calc Af Amer: 49 mL/min — ABNORMAL LOW (ref >60)
GFR, EST NON AFRICAN AMERICAN: 42 mL/min — AB (ref >60)
Glucose, Bld: 349 mg/dL — ABNORMAL HIGH (ref 65–99)
POTASSIUM: 5.1 mmol/L (ref 3.5–5.1)
SODIUM: 136 mmol/L (ref 135–145)

## 2016-08-30 LAB — NA AND K (SODIUM & POTASSIUM), RAND UR
POTASSIUM UR: 22 mmol/L
Sodium, Ur: 119 mmol/L

## 2016-08-30 LAB — CBC WITH DIFFERENTIAL/PLATELET
BASOS ABS: 0 10*3/uL (ref 0.0–0.1)
Basophils Relative: 0 %
Eosinophils Absolute: 0 10*3/uL (ref 0.0–0.7)
Eosinophils Relative: 0 %
HEMATOCRIT: 34.7 % — AB (ref 39.0–52.0)
Hemoglobin: 11.2 g/dL — ABNORMAL LOW (ref 13.0–17.0)
LYMPHS ABS: 0.5 10*3/uL — AB (ref 0.7–4.0)
LYMPHS PCT: 10 %
MCH: 30.3 pg (ref 26.0–34.0)
MCHC: 32.3 g/dL (ref 30.0–36.0)
MCV: 93.8 fL (ref 78.0–100.0)
MONO ABS: 0.1 10*3/uL (ref 0.1–1.0)
Monocytes Relative: 3 %
NEUTROS ABS: 4.4 10*3/uL (ref 1.7–7.7)
Neutrophils Relative %: 87 %
Platelets: 265 10*3/uL (ref 150–400)
RBC: 3.7 MIL/uL — AB (ref 4.22–5.81)
RDW: 12.8 % (ref 11.5–15.5)
WBC: 5.1 10*3/uL (ref 4.0–10.5)

## 2016-08-30 LAB — GLUCOSE, CAPILLARY
GLUCOSE-CAPILLARY: 396 mg/dL — AB (ref 65–99)
GLUCOSE-CAPILLARY: 280 mg/dL — AB (ref 65–99)
GLUCOSE-CAPILLARY: 351 mg/dL — AB (ref 65–99)
Glucose-Capillary: 313 mg/dL — ABNORMAL HIGH (ref 65–99)

## 2016-08-30 LAB — STREP PNEUMONIAE URINARY ANTIGEN: STREP PNEUMO URINARY ANTIGEN: NEGATIVE

## 2016-08-30 LAB — HIV ANTIBODY (ROUTINE TESTING W REFLEX): HIV Screen 4th Generation wRfx: NONREACTIVE

## 2016-08-30 LAB — POTASSIUM
Potassium: 4.9 mmol/L (ref 3.5–5.1)
Potassium: 5.2 mmol/L — ABNORMAL HIGH (ref 3.5–5.1)

## 2016-08-30 MED ORDER — BACITRACIN ZINC 500 UNIT/GM EX OINT
TOPICAL_OINTMENT | Freq: Two times a day (BID) | CUTANEOUS | Status: DC
  Administered 2016-08-30 (×2): via TOPICAL
  Administered 2016-08-31: 1 via TOPICAL
  Administered 2016-08-31 – 2016-09-01 (×2): via TOPICAL
  Filled 2016-08-30 (×2): qty 28.35

## 2016-08-30 MED ORDER — FUROSEMIDE 10 MG/ML IJ SOLN
40.0000 mg | Freq: Once | INTRAMUSCULAR | Status: AC
  Administered 2016-08-30: 40 mg via INTRAVENOUS
  Filled 2016-08-30: qty 4

## 2016-08-30 NOTE — Evaluation (Signed)
Occupational Therapy Evaluation and Discharge Patient Details Name: Timothy Cordova MRN: [id number] DOB: 1928-06-30 Today's Date: 08/30/2016    History of Present Illness Timothy Cordova is a 83 y.o. male with medical history significant for COPD, coronary artery disease, chronic diastolic CHF, chronic kidney disease stage III, hypertension, and type 2 diabetes mellitus.  Admitted with SOB and found to have CAP and COPD exacerbation.  EKG features a sinus rhythm with first-degree AV block, PVCs, PA-C, LAD, and RBBB   Clinical Impression   Pt reports he was independent with ADL PTA. Currently pt overall mod I for ADL and functional mobility. Pt with SOB with activity, SpO2=86% on RA. Pt well versed in energy conservation strategies and reports using them at home, with seated rest break SpO2=95% on RA. Pt planning to d/c home with 24/7 supervision from family. No further acute OT needs identified; signing off at this time. Please re-consult if needs change. Thank you for this referral.    Follow Up Recommendations  No OT follow up;Supervision - Intermittent    Equipment Recommendations  None recommended by OT    Recommendations for Other Services       Precautions / Restrictions Precautions Precautions: Fall Precaution Comments: one fall in past 6 months moving a rocking chair and arm fell off Restrictions Weight Bearing Restrictions: No      Mobility Bed Mobility               General bed mobility comments: Pt OOB in chair upon arrival  Transfers Overall transfer level: Modified independent Equipment used: None Transfers: Sit to/from Stand Sit to Stand: Modified independent (Device/Increase time)         General transfer comment: pt in recliner, but was unlocked, assist initially to stabilize due to chair moving, when sitting and standing rest of session once chair locked, no assist needed    Balance Overall balance assessment: No apparent balance deficits  (not formally assessed) Sitting-balance support: No upper extremity supported Sitting balance-Leahy Scale: Normal     Standing balance support: No upper extremity supported Standing balance-Leahy Scale: Good                             ADL either performed or assessed with clinical judgement   ADL Overall ADL's : Modified independent                                       General ADL Comments: Pt able to complete ADL and functional mobility at mod I level. Pt already aware of energy conservation strategies but reviewed with pt and family. SpO2=86% on RA with activity, with seated rest back up to 95% on RA.     Vision         Perception     Praxis      Pertinent Vitals/Pain Pain Assessment: No/denies pain     Hand Dominance     Extremity/Trunk Assessment Upper Extremity Assessment Upper Extremity Assessment: Overall WFL for tasks assessed   Lower Extremity Assessment Lower Extremity Assessment: Defer to PT evaluation RLE Deficits / Details: strength and AROM WFL, but with tremor, pt reports due to Neurontin (took higher than normal dose last pm) LLE Deficits / Details: strength and AROM WFL, but with tremor, pt reports due to Neurontin (took higher than normal dose last pm)   Cervical / Trunk  Assessment Cervical / Trunk Assessment: Kyphotic   Communication Communication Communication: No difficulties   Cognition Arousal/Alertness: Awake/alert Behavior During Therapy: WFL for tasks assessed/performed Overall Cognitive Status: Within Functional Limits for tasks assessed                                     General Comments  SpO2 with ambulation on RA 91-87%; with 1L O2 applied 91-86%, wtih 2L O2 applied 93-87%.  Pt reports previously had home O2 after another hospitalization and follow up at pulmonologist was 94% on RA so did not need.  Gave pt incentive spirometer and able to use effectively, educated in frequency and  repititions for use.     Exercises     Shoulder Instructions      Home Living Family/patient expects to be discharged to:: Private residence Living Arrangements: Spouse/significant other;Children Available Help at Discharge: Family;Available 24 hours/day Type of Home: House Home Access: Ramped entrance     Home Layout: Two level;Able to live on main level with bedroom/bathroom     Bathroom Shower/Tub: Walk-in shower   Bathroom Toilet: Handicapped height     Home Equipment: Environmental consultantWalker - 2 wheels;Walker - 4 wheels;Cane - single point;Bedside commode;Shower seat;Grab bars - toilet;Grab bars - tub/shower          Prior Functioning/Environment Level of Independence: Independent        Comments: feeds chickens, drives, does household chores        OT Problem List:        OT Treatment/Interventions:      OT Goals(Current goals can be found in the care plan section) Acute Rehab OT Goals Patient Stated Goal: To be able to feed chickens, etc with less SOB OT Goal Formulation: All assessment and education complete, DC therapy  OT Frequency:     Barriers to D/C:            Co-evaluation              AM-PAC PT "6 Clicks" Daily Activity     Outcome Measure Help from another person eating meals?: None Help from another person taking care of personal grooming?: None Help from another person toileting, which includes using toliet, bedpan, or urinal?: None Help from another person bathing (including washing, rinsing, drying)?: None Help from another person to put on and taking off regular upper body clothing?: None Help from another person to put on and taking off regular lower body clothing?: None 6 Click Score: 24   End of Session    Activity Tolerance: Patient tolerated treatment well Patient left: in chair;with call bell/phone within reach;with family/visitor present  OT Visit Diagnosis: History of falling (Z91.81)                Time: [id number] OT Time  Calculation (min): 26 min Charges:  OT General Charges $OT Visit: 1 Procedure OT Evaluation $OT Eval Moderate Complexity: 1 Procedure OT Treatments $Self Care/Home Management : 8-22 mins G-Codes:     Timothy Cordova, M.S., OTR/L Pager: [phone]  Timothy Cordova 08/30/2016, 3:48 PM

## 2016-08-30 NOTE — Evaluation (Signed)
Physical Therapy Evaluation Patient Details Name: Timothy Cordova MRN: [id number] DOB: [birth year] Today's Date: 08/30/2016   History of Present Illness  Timothy Cordova is a 83 y.o. male with medical history significant for COPD, coronary artery disease, chronic diastolic CHF, chronic kidney disease stage III, hypertension, and type 2 diabetes mellitus.  Admitted with SOB and found to have CAP and COPD exacerbation.  EKG features a sinus rhythm with first-degree AV block, PVCs, PA-C, LAD, and RBBB  Clinical Impression  Patient presents with decreased activity tolerance limiting mobility.  Feel he can benefit from skilled PT in the acute setting to maximize mobility and tolerance to activity for d/c home with family support and likely no follow up PT needs.    Follow Up Recommendations No PT follow up    Equipment Recommendations  None recommended by PT    Recommendations for Other Services       Precautions / Restrictions Precautions Precautions: Fall Precaution Comments: one fall in past 6 months moving a rocking chair and arm fell off      Mobility  Bed Mobility               General bed mobility comments: up in chair  Transfers Overall transfer level: Needs assistance   Transfers: Sit to/from Stand Sit to Stand: Modified independent (Device/Increase time)         General transfer comment: pt in recliner, but was unlocked, assist initially to stabilize due to chair moving, when sitting and standing rest of session once chair locked, no assist needed  Ambulation/Gait Ambulation/Gait assistance: Independent   Assistive device: None Gait Pattern/deviations: Step-through pattern;WFL(Within Functional Limits)     General Gait Details: mobilizing well, though with baseline postural deficits kyphotic and with forward head  Stairs            Wheelchair Mobility    Modified Rankin (Stroke Patients Only)       Balance Overall balance assessment:  Needs assistance Sitting-balance support: No upper extremity supported Sitting balance-Leahy Scale: Normal     Standing balance support: No upper extremity supported Standing balance-Leahy Scale: Good                               Pertinent Vitals/Pain Pain Assessment: No/denies pain    Home Living Family/patient expects to be discharged to:: Private residence Living Arrangements: Spouse/significant other;Children Available Help at Discharge: Family Type of Home: House Home Access: Ramped entrance     Home Layout: Two level;Able to live on main level with bedroom/bathroom Home Equipment: Dan Humphreys - 2 wheels;Walker - 4 wheels;Cane - single point;Bedside commode      Prior Function Level of Independence: Independent         Comments: feeds chickens, drives, does household chores     Hand Dominance        Extremity/Trunk Assessment   Upper Extremity Assessment Upper Extremity Assessment: Overall WFL for tasks assessed    Lower Extremity Assessment Lower Extremity Assessment: LLE deficits/detail;RLE deficits/detail RLE Deficits / Details: strength and AROM WFL, but with tremor, pt reports due to Neurontin (took higher than normal dose last pm) LLE Deficits / Details: strength and AROM WFL, but with tremor, pt reports due to Neurontin (took higher than normal dose last pm)       Communication   Communication: No difficulties  Cognition Arousal/Alertness: Awake/alert Behavior During Therapy: WFL for tasks assessed/performed Overall Cognitive Status: Within Functional Limits for  tasks assessed                                        General Comments General comments (skin integrity, edema, etc.): SpO2 with ambulation on RA 91-87%; with 1L O2 applied 91-86%, wtih 2L O2 applied 93-87%.  Pt reports previously had home O2 after another hospitalization and follow up at pulmonologist was 94% on RA so did not need.  Gave pt incentive spirometer  and able to use effectively, educated in frequency and repititions for use.     Exercises     Assessment/Plan    PT Assessment Patient needs continued PT services  PT Problem List Decreased activity tolerance;Decreased mobility       PT Treatment Interventions Gait training;Therapeutic activities;Patient/family education;Functional mobility training    PT Goals (Current goals can be found in the Care Plan section)  Acute Rehab PT Goals Patient Stated Goal: To be able to feed chickens, etc with less SOB PT Goal Formulation: With patient Time For Goal Achievement: 09/02/16 Potential to Achieve Goals: Good    Frequency Min 3X/week   Barriers to discharge        Co-evaluation               AM-PAC PT "6 Clicks" Daily Activity  Outcome Measure Difficulty turning over in bed (including adjusting bedclothes, sheets and blankets)?: None Difficulty moving from lying on back to sitting on the side of the bed? : None Difficulty sitting down on and standing up from a chair with arms (e.g., wheelchair, bedside commode, etc,.)?: None Help needed moving to and from a bed to chair (including a wheelchair)?: A Little Help needed walking in hospital room?: A Little Help needed climbing 3-5 steps with a railing? : A Little 6 Click Score: 21    End of Session Equipment Utilized During Treatment: Gait belt;Oxygen Activity Tolerance: Patient tolerated treatment well Patient left: in chair;with call bell/phone within reach   PT Visit Diagnosis: Other abnormalities of gait and mobility (R26.89)    Time: 6213-08651212-1242 PT Time Calculation (min) (ACUTE ONLY): 30 min   Charges:   PT Evaluation $PT Eval Moderate Complexity: 1 Procedure PT Treatments $Gait Training: 8-22 mins   PT G Codes:   PT G-Codes **NOT FOR INPATIENT CLASS** Functional Assessment Tool Used: AM-PAC 6 Clicks Basic Mobility Functional Limitation: Mobility: Walking and moving around Mobility: Walking and Moving Around  Current Status ([id number]([id number]): At least 20 percent but less than 40 percent impaired, limited or restricted Mobility: Walking and Moving Around Goal Status [phone](G8979): At least 1 percent but less than 20 percent impaired, limited or restricted    Alderyndi Qaadir Kent, South CarolinaPT [phone] 08/30/2016   Elray Mcgregorynthia Jessalyn Hinojosa 08/30/2016, 1:21 PM

## 2016-08-30 NOTE — Progress Notes (Signed)
Initial Nutrition Assessment  DOCUMENTATION CODES:  Not applicable  INTERVENTION:  Appetite is good. Spent entire visit discussing Low sodium diet and beneficial changes he could make to help improve breathing by decreasing swelling  NUTRITION DIAGNOSIS:  Limited adherence to nutrition-related recommendations related to devoting himself to caring for sick wife as evidenced by Report of consuming frozen meals, fast food and soups frequently.  GOAL:  Patient will meet greater than or equal to 90% of their needs  MONITOR:  PO intake, Labs, Weight trends, I & O's  REASON FOR ASSESSMENT:  Consult COPD Protocol  ASSESSMENT:  83 y/o male PMHx COPD, CHF, CAD, CKD3, HTN, DM2, HOH, HLD, HTN. Presents with progressive exertional dyspnea and cough w/ initial symptoms beginning 1 month ago. Worked up for CAP, COPD exacerbation and admitted for management  Pt reports that even though he has been sick, he has had a good appetite. He says he eats all of what he cooks at home. He says his quantity of intake is good, but knows the quality is poor. He admits to eating frozen meals and canned soups fairly frequently. 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 This takes of the vast majority of his time and does not have much time to cook or prepare meals. He will just eat something quick such as frozen meals or soup.   Pt's weight has been fairly stable. UBW appears to range in the 180s. Has been in this range for years.   RD spent the visit discussing with pt (with wife and brother who entered later) ways to reduce sodium intake. Pt's brother shared that the pt eats fast food frequently, which the pt confirmed. We discussed dangers with fast food and what items are best if he is to be eating out. Patient and wife are pretty knowledgeable in what foods are low in sodium and which arent. For example, they make sandwiches with baked meats that they slice to avoid lunch meat. They know hotdogs,  condiments and dressings are very high in salt.   RD's main recommendations were choosing easy-to-prepare foods that are low in sodium such as eggs or oatmeal. Instead of fast food burgers, they should make their own with fresh meats. He should definitely reduce the amount of soup/frozen meals these consume. He knows these are very high in salt.   It sounds that it is more about putting the knowledge into practice. Provided motivation  Physical Exam: WDL, edema  Meds: Insulin, Methylprednisolone, MVI with min, IV abx Labs: BG 300-400, K: 5.2, BUN/creat:27/1.44, Albumin: 3.1. A1C on 3/19 was 7.6   Recent Labs Lab 08/29/16 1330  08/29/16 2145 08/30/16 0217 08/30/16 0713  NA 137  --   --   --  136  K 5.6*  < > 5.1 4.9 5.2*  5.1  CL 103  --   --   --  103  CO2 24  --   --   --  24  BUN 21*  --   --   --  27*  CREATININE 1.49*  --   --   --  1.44*  CALCIUM 9.4  --   --   --  9.3  GLUCOSE 220*  --   --   --  349*  < > = values in this interval not displayed.;   Diet Order:  Diet heart healthy/carb modified Room service appropriate? Yes; Fluid consistency: Thin; Fluid restriction: 1500 mL Fluid  Skin: Has 3  wound to Left anterior distal leg- from fall, apparently healing.   Last BM:  5/22  Height:  Ht Readings from Last 1 Encounters:  08/29/16 5\' 7"  (1.702 m)   Weight:  Wt Readings from Last 1 Encounters:  08/30/16 183 lb 11.2 oz (83.3 kg)   Wt Readings from Last 10 Encounters:  08/30/16 183 lb 11.2 oz (83.3 kg)  07/31/16 189 lb (85.7 kg)  07/03/16 189 lb 8 oz (86 kg)  01/24/16 192 lb 4 oz (87.2 kg)  01/03/16 191 lb 3.2 oz (86.7 kg)  11/02/15 184 lb 9.6 oz (83.7 kg)  10/27/15 182 lb (82.6 kg)  10/13/15 186 lb 8 oz (84.6 kg)  10/06/15 180 lb 8.9 oz (81.9 kg)  09/01/15 191 lb 12 oz (87 kg)   Ideal Body Weight:  67.27 kg  BMI:  Body mass index is 28.77 kg/m.  Estimated Nutritional Needs:  Kcal:  1750-2000 kcals (21-24 kcal/kg bw) Protein:  80-95 (1.2-1.4 g/kg  bw) Fluid:  Per md <1.5 Liters  EDUCATION NEEDS:  Education needs addressed  Christophe LouisNathan Marykathleen Russi RD, LDN, CNSC Clinical Nutrition Pager: [phone] 08/30/2016 2:26 PM

## 2016-08-30 NOTE — Care Management Note (Signed)
Case Management Note  Patient Details  Name: Timothy Cordova MRN: [id number] Date of Birth: 25-Feb-1929  Subjective/Objective:                Spoke with patient at the bedside. He is from home with wife admitted for CAP describes several weeks of severe dyspnea on exertion.  Patient walks at home without assistance. Although ambulation has been limited by dyspnea recently per patient. Patient currently on RA. Patient does not use home oxygen, nebulizer or CPAP. Patient denies difficulties obtaining medications or getting to physicians. PT eval pending.   PCP[place] [facility] (Patient could not name any doctors, states he sees a different one every time he goes there.)  [place] [facility] [place] get [place].     Action/Plan:  CM will continue to follow.  Expected Discharge Date:  08/31/16               Expected Discharge Plan:  Home/Self Care  In-House Referral:     Discharge planning Services  CM Consult  Post Acute Care Choice:    Choice offered to:     DME Arranged:    DME Agency:     HH Arranged:    HH Agency:     Status of Service:  In process, will continue to follow  If discussed at Long Length of Stay Meetings, dates discussed:    Additional Comments:  Lawerance SabalDebbie Jaideep Pollack, RN 08/30/2016, 10:13 AM

## 2016-08-30 NOTE — Progress Notes (Signed)
Inpatient Diabetes Program Recommendations  AACE/ADA: New Consensus Statement on Inpatient Glycemic Control (2015)  Target Ranges:  Prepandial:   less than 140 mg/dL      Peak postprandial:   less than 180 mg/dL (1-2 hours)      Critically ill patients:  140 - 180 mg/dL   Results for Timothy Cordova, Timothy Cordova (MRN [id number]) as of 08/30/2016 09:22  Ref. Range 08/29/2016 21:56 08/30/2016 07:33  Glucose-Capillary Latest Ref Range: 65 - 99 mg/dL 409364 (H) 811313 (H)   Review of Glycemic Control  Diabetes history: DM2 Outpatient Diabetes medications: Glipizide 5 mg BID Current orders for Inpatient glycemic control: Novolog 0-15 units TID with meals, Novolog 0-5 units QHS, Novolog 3 units TID with meals  Inpatient Diabetes Program Recommendations: Insulin - Basal: If steroids are continued, please consider ordering Lantus 10 units Q24H. Insulin - Meal Coverage: Please consider increasing meal coverage to Novolog 5 units TID with meals.  Thanks, Orlando PennerMarie Sheritta Deeg, RN, MSN, CDE Diabetes Coordinator Inpatient Diabetes Program [phone] (Team Pager from 8am to 5pm)

## 2016-08-30 NOTE — Progress Notes (Signed)
PROGRESS NOTE  Timothy Cordova  ZOX:[id number] DOB: 1928/08/21 DOA: 08/29/2016 PCP: Hannah Beat, MD Outpatient Specialists:  Subjective: He is on room air, reported severe DOE. Continues to have cough and sputum production.  Brief Narrative:  Timothy Cordova is a 83 y.o. male with medical history significant for COPD, coronary artery disease, chronic diastolic CHF, chronic kidney disease stage III, hypertension, and type 2 diabetes mellitus, now presenting to the emergency department for evaluation of progressive exertional dyspnea and cough. Patient notes that the current symptoms began over a month ago, he saw his PCP on 07/31/2016, was diagnosed with exacerbation in COPD and prescribed doxycycline and prednisone. He reports significant improvement with this treatment, however, he began to worsen again shortly after completing the course. He now reports recurrent dyspnea with minimal exertion and cough productive of thick sputum. This has steadily worsened since he completed the antibiotic and steroid 3 weeks ago. Over the past 2 days, he reports symptoms at rest and new inability to ambulate across a room due to profound dyspnea. He denies fevers or chills, denies chest pain or palpitations, and denies any significant orthopnea or leg edema. There has not been any recent long distance travel or sick contacts. He tried to get in to see his PCP for this today, but was directed to the urgent care, and from there was advised to seek further evaluation in the ED.   Assessment & Plan:   Principal Problem:   CAP (community acquired pneumonia) Active Problems:   DM (diabetes mellitus), type 2 with renal complications (HCC)   Essential hypertension   CAD, AUTOLOGOUS BYPASS GRAFT   DIASTOLIC HEART FAILURE, CHRONIC   Centrilobular emphysema (HCC)   CKD (chronic kidney disease) stage 3, GFR 30-59 ml/min   COPD with acute exacerbation (HCC)   Hyperkalemia   Community-acquired PNA  - Pt  presents with progressive dyspnea and productive cough, found to have infiltrates in bilateral bases on CXR  - Treated with empiric Rocephin and azithromycin in ED - Desaturates with mild exertion, continue antibiotics, mucolytics and oxygen as needed.  COPD with acute exacerbation  - Pt presents with progressive dyspnea and productive cough, found to have CAP with wheezing and obstructive breathing  - Continue antibiotics and steroids.  Hyperkalemia  - Serum potassium 5.6 on arrival, 5.7 on repeat  - EKG with frequent PVC's, PAC's  - He has mild lower extremity edema actually I will diurese him.  CAD - No anginal complaints  - Troponin negative x2  - EKG with frequent PVC's, PAC  - Continue telemetry monitoring until potassium normalizes - Continue Toprol, statin, ASA 81   Chronic diastolic CHF  - Appears hypervolemic on admission  - TTE (04/27/13) with EF 50-55%, mild LAE, no significant valvular disease, impaired diastolic function  - He is managed at home with beta-blocker and Lasix 20 mg prn edema  - He was given 1 liter of NS in ED for his hyperkalemia, but appears volume-up and is given 20 mg IV Lasix on admission  - SLIV, follow daily wts and I/O's, continue beta-blocker    CKD stage III  - SCr is 1.49 on admission, this is around his baseline.  Type II DM  - A1c was 7.8% in March 2018  - Managed at home with glipizide; held on admission  - Check CBG with meals and qHS - Start a moderate-intensity Novlog sliding-scale   Hypertension  - BP slightly elevated on admission  - Continue Toprol  DVT prophylaxis:  Code Status: Full Code Family Communication:  Disposition Plan:  Diet: Diet heart healthy/carb modified Room service appropriate? Yes; Fluid consistency: Thin; Fluid restriction: 1500 mL Fluid  Consultants:   None  Procedures:   None  Antimicrobials:   Rocephin and Zithromax   Objective: Vitals:   08/30/16 0837 08/30/16 0908 08/30/16 1056  08/30/16 1142  BP:  105/79 (!) 151/44 (!) 155/49  Pulse:  89  67  Resp:  18  (!) 21  Temp:  97.4 F (36.3 C)  97.4 F (36.3 C)  TempSrc:  Oral  Oral  SpO2: 91% 95%  95%  Weight:      Height:        Intake/Output Summary (Last 24 hours) at 08/30/16 1147 Last data filed at 08/30/16 0838  Gross per 24 hour  Intake              530 ml  Output              900 ml  Net             -370 ml   Filed Weights   08/29/16 2011 08/30/16 0433  Weight: 84.9 kg (187 lb 1.6 oz) 83.3 kg (183 lb 11.2 oz)    Examination: General exam: Appears calm and comfortable  Respiratory system: Clear to auscultation. Respiratory effort normal. Cardiovascular system: S1 & S2 heard, RRR. No JVD, murmurs, rubs, gallops or clicks. No pedal edema. Gastrointestinal system: Abdomen is nondistended, soft and nontender. No organomegaly or masses felt. Normal bowel sounds heard. Central nervous system: Alert and oriented. No focal neurological deficits. Extremities: Symmetric 5 x 5 power. Skin: No rashes, lesions or ulcers Psychiatry: Judgement and insight appear normal. Mood & affect appropriate.   Data Reviewed: I have personally reviewed following labs and imaging studies  CBC:  Recent Labs Lab 08/29/16 1330 08/30/16 0217  WBC 6.5 5.1  NEUTROABS  --  4.4  HGB 12.0* 11.2*  HCT 38.3* 34.7*  MCV 95.0 93.8  PLT 280 265   Basic Metabolic Panel:  Recent Labs Lab 08/29/16 1330 08/29/16 1745 08/29/16 2145 08/30/16 0217 08/30/16 0713  NA 137  --   --   --  136  K 5.6* 5.7* 5.1 4.9 5.2*  5.1  CL 103  --   --   --  103  CO2 24  --   --   --  24  GLUCOSE 220*  --   --   --  349*  BUN 21*  --   --   --  27*  CREATININE 1.49*  --   --   --  1.44*  CALCIUM 9.4  --   --   --  9.3   GFR: Estimated Creatinine Clearance: 38 mL/min (A) (by C-G formula based on SCr of 1.44 mg/dL (H)). Liver Function Tests:  Recent Labs Lab 08/29/16 1745  AST 16  ALT 14*  ALKPHOS 71  BILITOT 0.7  PROT 6.8    ALBUMIN 3.1*   No results for input(s): LIPASE, AMYLASE in the last 168 hours. No results for input(s): AMMONIA in the last 168 hours. Coagulation Profile: No results for input(s): INR, PROTIME in the last 168 hours. Cardiac Enzymes: No results for input(s): CKTOTAL, CKMB, CKMBINDEX, TROPONINI in the last 168 hours. BNP (last 3 results) No results for input(s): PROBNP in the last 8760 hours. HbA1C: No results for input(s): HGBA1C in the last 72 hours. CBG:  Recent Labs Lab 08/29/16 2156 08/30/16  2956 08/30/16 1140  GLUCAP 364* 313* 396*   Lipid Profile: No results for input(s): CHOL, HDL, LDLCALC, TRIG, CHOLHDL, LDLDIRECT in the last 72 hours. Thyroid Function Tests: No results for input(s): TSH, T4TOTAL, FREET4, T3FREE, THYROIDAB in the last 72 hours. Anemia Panel: No results for input(s): VITAMINB12, FOLATE, FERRITIN, TIBC, IRON, RETICCTPCT in the last 72 hours. Urine analysis:    Component Value Date/Time   COLORURINE YELLOW 10/07/2015 0105   APPEARANCEUR CLEAR 10/07/2015 0105   LABSPEC 1.037 (H) 10/07/2015 0105   PHURINE 5.5 10/07/2015 0105   GLUCOSEU NEGATIVE 10/07/2015 0105   HGBUR NEGATIVE 10/07/2015 0105   BILIRUBINUR NEGATIVE 10/07/2015 0105   KETONESUR NEGATIVE 10/07/2015 0105   PROTEINUR 100 (A) 10/07/2015 0105   NITRITE NEGATIVE 10/07/2015 0105   LEUKOCYTESUR NEGATIVE 10/07/2015 0105   Sepsis Labs: @LABRCNTIP (procalcitonin:4,lacticidven:4)  )No results found for this or any previous visit (from the past 240 hour(s)).   Invalid input(s): PROCALCITONIN, LACTICACIDVEN   Radiology Studies: Dg Chest 2 View  Result Date: 08/29/2016 CLINICAL DATA:  Shortness of breath, cough EXAM: CHEST  2 VIEW COMPARISON:  12/10/2015, 10/27/2015 FINDINGS: Post sternotomy changes. Ill-defined bibasilar opacity. No pleural effusion stable borderline to mild cardiomegaly with atherosclerosis. Possible tiny pleural fluid on lateral view. Degenerative changes of the spine peer  postsurgical changes of the left humeral head. IMPRESSION: 1. Ill-defined bibasilar opacities may reflect mild infiltrates. Possible tiny pleural fluid on lateral image. 2. Stable borderline cardiomegaly without edema Electronically Signed   By: Jasmine Pang M.D.   On: 08/29/2016 14:48        Scheduled Meds: . aspirin EC  81 mg Oral Daily  . atorvastatin  40 mg Oral q1800  . bacitracin   Topical BID  . doxazosin  4 mg Oral Daily  . gabapentin  300 mg Oral QHS  . heparin  5,000 Units Subcutaneous Q8H  . insulin aspart  0-15 Units Subcutaneous TID WC  . insulin aspart  0-5 Units Subcutaneous QHS  . insulin aspart  3 Units Subcutaneous TID WC  . ipratropium-albuterol  3 mL Nebulization TID  . methylPREDNISolone (SOLU-MEDROL) injection  60 mg Intravenous Q6H  . metoprolol  100 mg Oral Daily  . multivitamin with minerals  1 tablet Oral Daily  . sodium chloride flush  3 mL Intravenous Q12H   Continuous Infusions: . sodium chloride    . azithromycin    . cefTRIAXone (ROCEPHIN)  IV       LOS: 1 day    Time spent: 35 minutes    Bonner Larue A, MD [facility] Pager [phone]  If 7PM-7AM, please contact night-coverage [email or web address] Password [facility] 08/30/2016, 11:47 AM

## 2016-08-30 NOTE — Consult Note (Signed)
WOC Nurse wound consult note Reason for Consult: lateral  LLE wounds Wound type: slow healing, DM, injury from 6 months ago, unstageable Pressure Injury POA: na Measurement: 0.75cm x 0.75cm, 0.5cm x 0.5cm, and 0.25cm x 0.25cm Wound bed: all wounds are scabbed over Drainage (amount, consistency, odor) none Periwound: intact Dressing procedure/placement/frequency: Pt states fell and hit leg 6 months ago.  Pt states it has been slow to heal but is healing. Pt places neosporin on it at home.  Pt states he will be going home tomorrow.  Will just order to cleanse wounds, apply Bacitracin, cover with dry gauze, BID. We will not follow, but will remain available to this patient, to nursing, and the medical and/or surgical teams.  Please re-consult if we need to assist further.    Barnett HatterMelinda Juanice Warburton, RN-C, WTA-C Wound Treatment Associate

## 2016-08-31 ENCOUNTER — Inpatient Hospital Stay (HOSPITAL_COMMUNITY): Payer: Medicare PPO

## 2016-08-31 DIAGNOSIS — I5030 Unspecified diastolic (congestive) heart failure: Secondary | ICD-10-CM

## 2016-08-31 DIAGNOSIS — E875 Hyperkalemia: Secondary | ICD-10-CM

## 2016-08-31 LAB — BASIC METABOLIC PANEL
Anion gap: 9 (ref 5–15)
BUN: 45 mg/dL — ABNORMAL HIGH (ref 6–20)
CALCIUM: 8.9 mg/dL (ref 8.9–10.3)
CO2: 24 mmol/L (ref 22–32)
CREATININE: 1.71 mg/dL — AB (ref 0.61–1.24)
Chloride: 101 mmol/L (ref 101–111)
GFR calc non Af Amer: 34 mL/min — ABNORMAL LOW (ref >60)
GFR, EST AFRICAN AMERICAN: 40 mL/min — AB (ref >60)
GLUCOSE: 395 mg/dL — AB (ref 65–99)
Potassium: 5.4 mmol/L — ABNORMAL HIGH (ref 3.5–5.1)
Sodium: 134 mmol/L — ABNORMAL LOW (ref 135–145)

## 2016-08-31 LAB — CBC
HCT: 33.5 % — ABNORMAL LOW (ref 39.0–52.0)
Hemoglobin: 10.5 g/dL — ABNORMAL LOW (ref 13.0–17.0)
MCH: 29.3 pg (ref 26.0–34.0)
MCHC: 31.3 g/dL (ref 30.0–36.0)
MCV: 93.6 fL (ref 78.0–100.0)
PLATELETS: 283 10*3/uL (ref 150–400)
RBC: 3.58 MIL/uL — ABNORMAL LOW (ref 4.22–5.81)
RDW: 12.8 % (ref 11.5–15.5)
WBC: 12.7 10*3/uL — ABNORMAL HIGH (ref 4.0–10.5)

## 2016-08-31 LAB — GLUCOSE, CAPILLARY
GLUCOSE-CAPILLARY: 305 mg/dL — AB (ref 65–99)
Glucose-Capillary: 336 mg/dL — ABNORMAL HIGH (ref 65–99)
Glucose-Capillary: 378 mg/dL — ABNORMAL HIGH (ref 65–99)

## 2016-08-31 MED ORDER — FUROSEMIDE 10 MG/ML IJ SOLN
20.0000 mg | Freq: Once | INTRAMUSCULAR | Status: AC
Start: 2016-08-31 — End: 2016-08-31
  Administered 2016-08-31: 20 mg via INTRAVENOUS
  Filled 2016-08-31: qty 2

## 2016-08-31 MED ORDER — SODIUM POLYSTYRENE SULFONATE 15 GM/60ML PO SUSP
15.0000 g | Freq: Once | ORAL | Status: AC
  Administered 2016-08-31: 15 g via ORAL
  Filled 2016-08-31: qty 60

## 2016-08-31 MED ORDER — PREDNISONE 20 MG PO TABS
60.0000 mg | ORAL_TABLET | Freq: Every day | ORAL | Status: DC
  Administered 2016-08-31 – 2016-09-01 (×2): 60 mg via ORAL
  Filled 2016-08-31 (×2): qty 3

## 2016-08-31 MED ORDER — AZITHROMYCIN 500 MG PO TABS
500.0000 mg | ORAL_TABLET | Freq: Every day | ORAL | Status: DC
  Administered 2016-08-31: 500 mg via ORAL
  Filled 2016-08-31: qty 1

## 2016-08-31 NOTE — Progress Notes (Signed)
Physical Therapy Treatment Patient Details Name: Timothy Cordova MRN: [id number] DOB: 1928-08-17 Today's Date: 08/31/2016    History of Present Illness Timothy Cordova is a 83 y.o. male with medical history significant for COPD, coronary artery disease, chronic diastolic CHF, chronic kidney disease stage III, hypertension, and type 2 diabetes mellitus.  Admitted with SOB and found to have CAP and COPD exacerbation.  EKG features a sinus rhythm with first-degree AV block, PVCs, PA-C, LAD, and RBBB    PT Comments    Pt presents with improved mobility, but continues to have a drop in O2 sats with activity which does not improved with supplemental oxygen. Pt O2 sats drops to 84% and he has to stop and rest due to noticeable hip pain and then with rest they improve to 95%. Pt knows his limits with activity and has been using incentive spirometer throughout the day. Pt continues to benefit from continued acute follow-up to monitor tolerance to activity prior to DC.    Follow Up Recommendations  No PT follow up     Equipment Recommendations  None recommended by PT    Recommendations for Other Services       Precautions / Restrictions Precautions Precautions: Fall Precaution Comments: one fall in past 6 months moving a rocking chair and arm fell off Restrictions Weight Bearing Restrictions: No    Mobility  Bed Mobility Overal bed mobility: Independent             General bed mobility comments: sitting EOB  Transfers Overall transfer level: Modified independent Equipment used: None   Sit to Stand: Modified independent (Device/Increase time)         General transfer comment: pt able to sit from EOB and recliner to access paperwork on the otherside of the room without any LOB  Ambulation/Gait Ambulation/Gait assistance: Supervision Ambulation Distance (Feet): 150 Feet (75x2) Assistive device: None Gait Pattern/deviations: Step-through pattern;Decreased step length -  right;Decreased step length - left   Gait velocity interpretation: at or above normal speed for age/gender General Gait Details: pt continues to have a kyphotic posture and decreased step length with gait. Minimal instability noted with gait this session requring supervision.    Stairs            Wheelchair Mobility    Modified Rankin (Stroke Patients Only)       Balance Overall balance assessment: No apparent balance deficits (not formally assessed) Sitting-balance support: No upper extremity supported Sitting balance-Leahy Scale: Normal     Standing balance support: No upper extremity supported Standing balance-Leahy Scale: Good                              Cognition Arousal/Alertness: Awake/alert Behavior During Therapy: WFL for tasks assessed/performed Overall Cognitive Status: Within Functional Limits for tasks assessed                                        Exercises      General Comments        Pertinent Vitals/Pain Pain Assessment: No/denies pain    Home Living                      Prior Function            PT Goals (current goals can now be found in the care plan  section) Acute Rehab PT Goals Patient Stated Goal: To be able to feed chickens, etc with less SOB Progress towards PT goals: Progressing toward goals    Frequency    Min 3X/week      PT Plan Current plan remains appropriate    Co-evaluation              AM-PAC PT "6 Clicks" Daily Activity  Outcome Measure  Difficulty turning over in bed (including adjusting bedclothes, sheets and blankets)?: None Difficulty moving from lying on back to sitting on the side of the bed? : None Difficulty sitting down on and standing up from a chair with arms (e.g., wheelchair, bedside commode, etc,.)?: None Help needed moving to and from a bed to chair (including a wheelchair)?: A Little Help needed walking in hospital room?: A Little Help needed  climbing 3-5 steps with a railing? : A Little 6 Click Score: 21    End of Session Equipment Utilized During Treatment: Gait belt Activity Tolerance: Patient tolerated treatment well Patient left: in chair;with call bell/phone within reach Nurse Communication: Mobility status PT Visit Diagnosis: Other abnormalities of gait and mobility (R26.89)     Time: [id number] PT Time Calculation (min) (ACUTE ONLY): 24 min  Charges:  $Gait Training: 23-37 mins                    G Codes:       Colin BroachSabra M. Daxon Kyne PT, DPT  [phone]    Timothy FavorsSabra Aletha HalimMarie Gaddiel Cordova 08/31/2016, 9:02 AM

## 2016-08-31 NOTE — Progress Notes (Signed)
PROGRESS NOTE  Timothy Cordova  ZOX:[id number] DOB: 01/08/1929 DOA: 08/29/2016 PCP: Hannah Beat, MD Outpatient Specialists:  Subjective: Denies any fever or chills, continue to have shortness of breath, cough or sputum production. Continues to have exertional dyspnea.  Brief Narrative:  Timothy Cordova is a 83 y.o. male with medical history significant for COPD, coronary artery disease, chronic diastolic CHF, chronic kidney disease stage III, hypertension, and type 2 diabetes mellitus, now presenting to the emergency department for evaluation of progressive exertional dyspnea and cough. Patient notes that the current symptoms began over a month ago, he saw his PCP on 07/31/2016, was diagnosed with exacerbation in COPD and prescribed doxycycline and prednisone. He reports significant improvement with this treatment, however, he began to worsen again shortly after completing the course. He now reports recurrent dyspnea with minimal exertion and cough productive of thick sputum. This has steadily worsened since he completed the antibiotic and steroid 3 weeks ago. Over the past 2 days, he reports symptoms at rest and new inability to ambulate across a room due to profound dyspnea. He denies fevers or chills, denies chest pain or palpitations, and denies any significant orthopnea or leg edema. There has not been any recent long distance travel or sick contacts. He tried to get in to see his PCP for this today, but was directed to the urgent care, and from there was advised to seek further evaluation in the ED.   Assessment & Plan:   Principal Problem:   CAP (community acquired pneumonia) Active Problems:   DM (diabetes mellitus), type 2 with renal complications (HCC)   Essential hypertension   CAD, AUTOLOGOUS BYPASS GRAFT   DIASTOLIC HEART FAILURE, CHRONIC   Centrilobular emphysema (HCC)   CKD (chronic kidney disease) stage 3, GFR 30-59 ml/min   COPD with acute exacerbation (HCC)  Hyperkalemia   Community-acquired PNA  - Pt presents with progressive dyspnea and productive cough, found to have infiltrates in bilateral bases on CXR  - Treated with empiric Rocephin and azithromycin in ED - Desaturates with mild exertion, continue antibiotics, mucolytics and oxygen as needed. - Continue current antibiotics likely to be discharged in a.m.  COPD with acute exacerbation  - Pt presents with progressive dyspnea and productive cough, found to have CAP with wheezing and obstructive breathing  - Continue antibiotics and steroids.  Hyperkalemia  - Serum potassium 5.6 on arrival, 5.7 on repeat  - EKG with frequent PVC's, PAC's  - Repeat Lasix and give Kayexalate, check BMP in a.m.  CAD - No anginal complaints  - Troponin negative x2  - EKG with frequent PVC's, PAC  - Continue telemetry monitoring until potassium normalizes - Continue Toprol, statin, ASA 81   Chronic diastolic CHF  - Appears hypervolemic on admission  - TTE (04/27/13) with EF 50-55%, mild LAE, no significant valvular disease, impaired diastolic function  - He is managed at home with beta-blocker and Lasix 20 mg prn edema  - He was given 1 liter of NS in ED for his hyperkalemia, but appears volume-up and is given 20 mg IV Lasix on admission  - SLIV, follow daily wts and I/O's, continue beta-blocker    CKD stage III  - SCr is 1.49 on admission, this is around his baseline.  Type II DM  - A1c was 7.8% in March 2018  - Managed at home with glipizide; held on admission  - Check CBG with meals and qHS - Currently suffers from steroids induced hyperglycemia, uncontrolled diabetes with hyperglycemia  Hypertension  - BP slightly elevated on admission  - Continue Toprol    DVT prophylaxis:  Code Status: Full Code Family Communication:  Disposition Plan:  Diet: Diet heart healthy/carb modified Room service appropriate? Yes; Fluid consistency: Thin; Fluid restriction: 1500 mL Fluid  Consultants:    None  Procedures:   None  Antimicrobials:   Rocephin and Zithromax   Objective: Vitals:   08/31/16 0743 08/31/16 0806 08/31/16 1007 08/31/16 1130  BP: (!) 167/51  (!) 124/44 (!) 153/55  Pulse: 68  75 64  Resp: 19     Temp: 97.9 F (36.6 C)   97.7 F (36.5 C)  TempSrc: Oral   Oral  SpO2: 98% 98%  96%  Weight:      Height:        Intake/Output Summary (Last 24 hours) at 08/31/16 1143 Last data filed at 08/31/16 0843  Gross per 24 hour  Intake              960 ml  Output              630 ml  Net              330 ml   Filed Weights   08/29/16 2011 08/30/16 0433 08/31/16 0514  Weight: 84.9 kg (187 lb 1.6 oz) 83.3 kg (183 lb 11.2 oz) 84.5 kg (186 lb 3.2 oz)    Examination: General exam: Appears calm and comfortable  Respiratory system: Clear to auscultation. Respiratory effort normal. Cardiovascular system: S1 & S2 heard, RRR. No JVD, murmurs, rubs, gallops or clicks. No pedal edema. Gastrointestinal system: Abdomen is nondistended, soft and nontender. No organomegaly or masses felt. Normal bowel sounds heard. Central nervous system: Alert and oriented. No focal neurological deficits. Extremities: Symmetric 5 x 5 power. Skin: No rashes, lesions or ulcers Psychiatry: Judgement and insight appear normal. Mood & affect appropriate.   Data Reviewed: I have personally reviewed following labs and imaging studies  CBC:  Recent Labs Lab 08/29/16 1330 08/30/16 0217 08/31/16 0354  WBC 6.5 5.1 12.7*  NEUTROABS  --  4.4  --   HGB 12.0* 11.2* 10.5*  HCT 38.3* 34.7* 33.5*  MCV 95.0 93.8 93.6  PLT 280 265 283   Basic Metabolic Panel:  Recent Labs Lab 08/29/16 1330 08/29/16 1745 08/29/16 2145 08/30/16 0217 08/30/16 0713 08/31/16 0354  NA 137  --   --   --  136 134*  K 5.6* 5.7* 5.1 4.9 5.2*  5.1 5.4*  CL 103  --   --   --  103 101  CO2 24  --   --   --  24 24  GLUCOSE 220*  --   --   --  349* 395*  BUN 21*  --   --   --  27* 45*  CREATININE 1.49*  --   --    --  1.44* 1.71*  CALCIUM 9.4  --   --   --  9.3 8.9   GFR: Estimated Creatinine Clearance: 32.2 mL/min (A) (by C-G formula based on SCr of 1.71 mg/dL (H)). Liver Function Tests:  Recent Labs Lab 08/29/16 1745  AST 16  ALT 14*  ALKPHOS 71  BILITOT 0.7  PROT 6.8  ALBUMIN 3.1*   No results for input(s): LIPASE, AMYLASE in the last 168 hours. No results for input(s): AMMONIA in the last 168 hours. Coagulation Profile: No results for input(s): INR, PROTIME in the last 168 hours. Cardiac Enzymes: No results for  input(s): CKTOTAL, CKMB, CKMBINDEX, TROPONINI in the last 168 hours. BNP (last 3 results) No results for input(s): PROBNP in the last 8760 hours. HbA1C: No results for input(s): HGBA1C in the last 72 hours. CBG:  Recent Labs Lab 08/30/16 1140 08/30/16 1700 08/30/16 2138 08/31/16 0746 08/31/16 1129  GLUCAP 396* 280* 351* 305* 336*   Lipid Profile: No results for input(s): CHOL, HDL, LDLCALC, TRIG, CHOLHDL, LDLDIRECT in the last 72 hours. Thyroid Function Tests: No results for input(s): TSH, T4TOTAL, FREET4, T3FREE, THYROIDAB in the last 72 hours. Anemia Panel: No results for input(s): VITAMINB12, FOLATE, FERRITIN, TIBC, IRON, RETICCTPCT in the last 72 hours. Urine analysis:    Component Value Date/Time   COLORURINE YELLOW 10/07/2015 0105   APPEARANCEUR CLEAR 10/07/2015 0105   LABSPEC 1.037 (H) 10/07/2015 0105   PHURINE 5.5 10/07/2015 0105   GLUCOSEU NEGATIVE 10/07/2015 0105   HGBUR NEGATIVE 10/07/2015 0105   BILIRUBINUR NEGATIVE 10/07/2015 0105   KETONESUR NEGATIVE 10/07/2015 0105   PROTEINUR 100 (A) 10/07/2015 0105   NITRITE NEGATIVE 10/07/2015 0105   LEUKOCYTESUR NEGATIVE 10/07/2015 0105   Sepsis Labs: @LABRCNTIP (procalcitonin:4,lacticidven:4)  )No results found for this or any previous visit (from the past 240 hour(s)).   Invalid input(s): PROCALCITONIN, LACTICACIDVEN   Radiology Studies: Dg Chest 2 View  Result Date: 08/29/2016 CLINICAL  DATA:  Shortness of breath, cough EXAM: CHEST  2 VIEW COMPARISON:  12/10/2015, 10/27/2015 FINDINGS: Post sternotomy changes. Ill-defined bibasilar opacity. No pleural effusion stable borderline to mild cardiomegaly with atherosclerosis. Possible tiny pleural fluid on lateral view. Degenerative changes of the spine peer postsurgical changes of the left humeral head. IMPRESSION: 1. Ill-defined bibasilar opacities may reflect mild infiltrates. Possible tiny pleural fluid on lateral image. 2. Stable borderline cardiomegaly without edema Electronically Signed   By: Jasmine PangKim  Fujinaga M.D.   On: 08/29/2016 14:48        Scheduled Meds: . aspirin EC  81 mg Oral Daily  . atorvastatin  40 mg Oral q1800  . azithromycin  500 mg Oral q1800  . bacitracin   Topical BID  . doxazosin  4 mg Oral Daily  . gabapentin  300 mg Oral QHS  . heparin  5,000 Units Subcutaneous Q8H  . insulin aspart  0-15 Units Subcutaneous TID WC  . insulin aspart  0-5 Units Subcutaneous QHS  . insulin aspart  3 Units Subcutaneous TID WC  . metoprolol  100 mg Oral Daily  . multivitamin with minerals  1 tablet Oral Daily  . predniSONE  60 mg Oral Daily   Continuous Infusions: . cefTRIAXone (ROCEPHIN)  IV Stopped (08/30/16 1851)     LOS: 2 days    Time spent: 35 minutes    Javonda Suh A, MD [facility] Pager [phone]  If 7PM-7AM, please contact night-coverage [email or web address] Password [email or web address] 08/31/2016, 11:43 AM

## 2016-08-31 NOTE — Progress Notes (Signed)
PHARMACIST - PHYSICIAN COMMUNICATION  CONCERNING: Antibiotic IV to Oral Route Change Policy  RECOMMENDATION: This patient is receiving Azithromycin by the intravenous route.  Based on criteria approved by the Pharmacy and Therapeutics Committee, the antibiotic(s) is/are being converted to the equivalent oral dose form(s).   DESCRIPTION: These criteria include:  Patient being treated for a respiratory tract infection, urinary tract infection, cellulitis or clostridium difficile associated diarrhea if on metronidazole  The patient is not neutropenic and does not exhibit a GI malabsorption state  The patient is eating (either orally or via tube) and/or has been taking other orally administered medications for a least 24 hours  The patient is improving clinically and has a Tmax < 100.5  If you have questions about this conversion, please contact the Pharmacy Department  []   [phone]( [phone] )  [facility] []   [phone]( [phone] )  [facility] [x]   [phone]( [phone] )  [facility] []   [phone]( [phone]  [facility] []   [phone]( [phone] )  [facility]   Harland Germanndrew Janina Trafton, [place] 08/31/2016 8:04 AM

## 2016-08-31 NOTE — Progress Notes (Signed)
Inpatient Diabetes Program Recommendations  AACE/ADA: New Consensus Statement on Inpatient Glycemic Control (2015)  Target Ranges:  Prepandial:   less than 140 mg/dL      Peak postprandial:   less than 180 mg/dL (1-2 hours)      Critically ill patients:  140 - 180 mg/dL   Results for Timothy Cordova, Timothy Cordova (MRN [id number]) as of 08/31/2016 08:27  Ref. Range 08/30/2016 07:33 08/30/2016 11:40 08/30/2016 17:00 08/30/2016 21:38 08/31/2016 07:46  Glucose-Capillary Latest Ref Range: 65 - 99 mg/dL 409313 (H) 811396 (H) 914280 (H) 351 (H) 305 (H)   Review of Glycemic Control  Diabetes history: DM2 Outpatient Diabetes medications: Glipizide 5 mg BID Current orders for Inpatient glycemic control: Novolog 0-15 units TID with meals, Novolog 0-5 units QHS, Novolog 3 units TID with meals  Inpatient Diabetes Program Recommendations: Insulin - Basal: Noted steroids have been changed from IV to PO and decreased dose. Fasting glucose 305 mg/dl this morning. While inpatient and ordered steroids, please consider ordering Lantus 10 units Q24H. Insulin - Meal Coverage: Please consider increasing meal coverage to Novolog 5 units TID with meals.  Thanks, Timothy PennerMarie Xavius Spadafore, RN, MSN, CDE Diabetes Coordinator Inpatient Diabetes Program [phone] (Team Pager from 8am to 5pm)

## 2016-09-01 DIAGNOSIS — I25810 Atherosclerosis of coronary artery bypass graft(s) without angina pectoris: Secondary | ICD-10-CM

## 2016-09-01 LAB — BASIC METABOLIC PANEL
Anion gap: 7 (ref 5–15)
BUN: 46 mg/dL — AB (ref 6–20)
CO2: 27 mmol/L (ref 22–32)
Calcium: 8.9 mg/dL (ref 8.9–10.3)
Chloride: 102 mmol/L (ref 101–111)
Creatinine, Ser: 1.47 mg/dL — ABNORMAL HIGH (ref 0.61–1.24)
GFR calc Af Amer: 48 mL/min — ABNORMAL LOW (ref >60)
GFR, EST NON AFRICAN AMERICAN: 41 mL/min — AB (ref >60)
Glucose, Bld: 343 mg/dL — ABNORMAL HIGH (ref 65–99)
Potassium: 4.2 mmol/L (ref 3.5–5.1)
SODIUM: 136 mmol/L (ref 135–145)

## 2016-09-01 LAB — ECHOCARDIOGRAM COMPLETE
HEIGHTINCHES: 67 in
WEIGHTICAEL: 2979.2 [oz_av]

## 2016-09-01 LAB — GLUCOSE, CAPILLARY
GLUCOSE-CAPILLARY: 283 mg/dL — AB (ref 65–99)
Glucose-Capillary: 280 mg/dL — ABNORMAL HIGH (ref 65–99)
Glucose-Capillary: 342 mg/dL — ABNORMAL HIGH (ref 65–99)

## 2016-09-01 MED ORDER — LEVOFLOXACIN 750 MG PO TABS: 750.0000 mg | ORAL_TABLET | Freq: Every day | ORAL | 0 refills | Status: DC

## 2016-09-01 MED ORDER — FUROSEMIDE 10 MG/ML IJ SOLN
40.0000 mg | Freq: Once | INTRAMUSCULAR | Status: AC
  Administered 2016-09-01: 40 mg via INTRAVENOUS
  Filled 2016-09-01: qty 4

## 2016-09-01 MED ORDER — GUAIFENESIN ER 600 MG PO TB12: 1200.0000 mg | ORAL_TABLET | Freq: Two times a day (BID) | ORAL | 0 refills | Status: DC

## 2016-09-01 MED ORDER — LEVOFLOXACIN 500 MG PO TABS: 500.0000 mg | ORAL_TABLET | Freq: Every day | ORAL | 0 refills | Status: DC

## 2016-09-01 MED ORDER — FUROSEMIDE 20 MG PO TABS: 40.0000 mg | ORAL_TABLET | Freq: Every day | ORAL | 0 refills | Status: DC

## 2016-09-01 NOTE — Progress Notes (Signed)
Patient given discharge instructions and all questions answered.  

## 2016-09-01 NOTE — Care Management Important Message (Signed)
Important Message  Patient Details  Name: Timothy Cordova MRN: [id number] Date of Birth: 31-Jul-1928   Medicare Important Message Given:       [name] 09/01/2016, 1:43 PM

## 2016-09-01 NOTE — Consult Note (Signed)
   [facility] CM Inpatient Consult   09/01/2016  NOA CONSTANTE Aug 23, 1928 [id number]  Patient was assessed for post hospitial follow up needs for COPD/PNA.  Patient was admitted with Pneumonia. Met with the patient at the bedside to assess and explain post hospital follow up needs.  Patient sees his primary care provider as needed, has no trouble obtaining or adhering to his medications, has good transportation.  Patient states that it seems that he is able to expectorate better today. Patient is short of breath but able to complete his sentence with the Probation officer.  Patient is agreeable to EMMI Pneumonia calls. Explained that if he has issues a [facility] Telephonic nurse can follow up. Patient was given a brochure, magnet with 24 hour nurse advise line to use at anytime.  Patient verbalized understanding.  For questions, please contact:  Natividad Brood, RN BSN [place] [facility] Liaison  [phone] business mobile phone Toll free office [phone]

## 2016-09-01 NOTE — Discharge Summary (Signed)
Physician Discharge Summary  Timothy Cordova ZOX:[id number] DOB: 1928-08-27 DOA: 08/29/2016  PCP: Timothy Beat, MD  Admit date: 08/29/2016 Discharge date: 09/01/2016  Admitted From: Home Disposition: Home  Recommendations for Outpatient Follow-up:  1. Follow up with PCP in 1-2 weeks 2. Please obtain BMP/CBC in one week. 3. Levofloxacin 500 mg for 5 more days, Lasix increased to 40 mg daily follow renal function closely.  Home Health: NA Equipment/Devices:NA  Discharge Condition: Stable CODE STATUS: Full Code Diet recommendation: Diet heart healthy/carb modified Room service appropriate? Yes; Fluid consistency: Thin; Fluid restriction: 1500 mL Fluid Diet - low sodium heart healthy  Brief/Interim Summary: Timothy Cordova a 83 y.o.malewith medical history significant forCOPD, coronary artery disease, chronic diastolic CHF, chronic kidney disease stage III, hypertension, and type 2 diabetes mellitus, now presenting to the emergency department for evaluation of progressive exertional dyspnea and cough. Patient notes that the current symptoms began over a month ago, he saw his PCP on 07/31/2016, was diagnosed with exacerbation in COPD and prescribed doxycycline and prednisone. He reports significant improvement with this treatment, however, he began to worsen again shortly after completing the course. He now reports recurrent dyspnea with minimal exertion and cough productive of thick sputum. This has steadily worsened since he completed the antibiotic and steroid 3 weeks ago. Over the past 2 days, he reports symptoms at rest and new inability to ambulate across a room due to profound dyspnea. He denies fevers or chills, denies chest pain or palpitations, and denies any significant orthopnea or leg edema. There has not been any recent long distance travel or sick contacts. He tried to get in to see his PCP for this today, but was directed to the urgent care, and from there was advised  to seek further evaluation in the ED  Discharge Diagnoses:  Principal Problem:   CAP (community acquired pneumonia) Active Problems:   DM (diabetes mellitus), type 2 with renal complications (HCC)   Essential hypertension   CAD, AUTOLOGOUS BYPASS GRAFT   DIASTOLIC HEART FAILURE, CHRONIC   Centrilobular emphysema (HCC)   CKD (chronic kidney disease) stage 3, GFR 30-59 ml/min   COPD with acute exacerbation (HCC)   Hyperkalemia   Community-acquired PNA  - Pt presents with progressive dyspnea and productive cough, found to have infiltrates in bilateral bases on CXR  - Treated with empiric Rocephin and azithromycin in ED - Desaturates with mild exertion, continue antibiotics, mucolytics and oxygen as needed. - On discharge ambulate on the whole saturation was above 91%. - Discharged on levofloxacin and Mucinex for 5 more days.  COPD with acute exacerbation  - Pt presents with progressive dyspnea and productive cough, found to have CAP with wheezing and obstructive breathing  - Continue antibiotics and steroids.  Hyperkalemia  - Serum potassium 5.6 on arrival, 5.7 on repeat  - Treated with Lasix and Kayexalate, potassium 4.2 on discharge.  CAD - No anginal complaints  - Troponin negative x2  - EKG with frequent PVC's, PAC  - Continue telemetry monitoring until potassium normalizes - Continue Toprol, statin, ASA 81   Chronic diastolic CHF  - Appears hypervolemic on admission  - TTE (04/27/13) with EF 50-55%, mild LAE, no significant valvular disease, impaired diastolic function . -Repeat 2-D echo done on 08/31/2016 showed EF of 55-60% with grade 1 diastolic dysfunction - He is managed at home with beta-blocker and Lasix 20 mg prn edema  - Beta blockers continued, his Lasix increased to 40 mg daily, to follow-up with PCP.  CKD stage III  - SCr is 1.49 on admission, this is around his baseline. On discharge creatinine 1.4.  Type II DM  - A1c was 7.8% in March 2018  indicates uncontrolled diabetes with hyperglycemia - Managed at home with glipizide; held on admission  - Reports it's been controlled at home. -In the hospital had very high blood sugars because of steroid-induced hyperglycemia  Hypertension  - BP slightly elevated on admission  - Continue Toprol   Discharge Instructions  Discharge Instructions    Diet - low sodium heart healthy    Complete by:  As directed    Increase activity slowly    Complete by:  As directed      Allergies as of 09/01/2016      Reactions   Metformin And Related Nausea And Vomiting   Tramadol Hcl    Tremor vs seizure activity   Ace Inhibitors    REACTION: cough   Atorvastatin    REACTION: weakness. ( currently on this med at home)   Codeine Nausea And Vomiting   Morphine Sulfate Nausea And Vomiting   REACTION: unspecified   Ticlopidine Hcl    REACTION: unspecified   Warfarin Sodium Other (See Comments)   Nausea and vomiting       Medication List    TAKE these medications   acetaminophen 325 MG tablet Commonly known as:  TYLENOL Take 650 mg by mouth at bedtime.   albuterol 108 (90 Base) MCG/ACT inhaler Commonly known as:  PROVENTIL HFA;VENTOLIN HFA Inhale 2 puffs into the lungs every 6 (six) hours as needed for wheezing or shortness of breath.   aspirin 81 MG tablet Take 81 mg by mouth daily.   atorvastatin 40 MG tablet Commonly known as:  LIPITOR Take 40 mg by mouth daily.   doxazosin 8 MG tablet Commonly known as:  CARDURA Take 4 mg by mouth daily.   furosemide 20 MG tablet Commonly known as:  LASIX Take 2 tablets (40 mg total) by mouth daily. What changed:  how much to take  when to take this  reasons to take this   gabapentin 300 MG capsule Commonly known as:  NEURONTIN Take 300 mg by mouth at bedtime.   glipiZIDE 5 MG tablet Commonly known as:  GLUCOTROL Take 1 tablet (5 mg total) by mouth 2 (two) times daily before a meal.   guaiFENesin 600 MG 12 hr  tablet Commonly known as:  MUCINEX Take 2 tablets (1,200 mg total) by mouth 2 (two) times daily.   levofloxacin 500 MG tablet Commonly known as:  LEVAQUIN Take 1 tablet (500 mg total) by mouth daily.   metoprolol 200 MG 24 hr tablet Commonly known as:  TOPROL-XL Take 100 mg by mouth daily.   ONE TOUCH ULTRA TEST test strip Generic drug:  glucose blood USE TO CHECK BLOOD SUGAR 2 TIMES DAILY AS DIRECTED   ONETOUCH DELICA LANCETS 33G Misc   PRESERVISION AREDS PO Take 1 tablet by mouth daily.   CENTRUM SILVER 50+MEN Tabs Take 1 tablet by mouth daily.   STIOLTO RESPIMAT 2.5-2.5 MCG/ACT Aers Generic drug:  Tiotropium Bromide-Olodaterol Inhale 2 puffs into the lungs every morning.   traZODone 50 MG tablet Commonly known as:  DESYREL Take 1-2 tablets (50-100 mg total) by mouth at bedtime as needed for sleep.      Follow-up Information    Timothy Beat, MD On 09/11/2016.   Specialty:  Family Medicine Why:  At 3:15pm for hospital follow up appt.  Contact  information: [address] [place] [address] [phone]          Allergies  Allergen Reactions  . Metformin And Related Nausea And Vomiting  . Tramadol Hcl     Tremor vs seizure activity  . Ace Inhibitors     REACTION: cough  . Atorvastatin     REACTION: weakness. ( currently on this med at home)  . Codeine Nausea And Vomiting  . Morphine Sulfate Nausea And Vomiting    REACTION: unspecified  . Ticlopidine Hcl     REACTION: unspecified  . Warfarin Sodium Other (See Comments)    Nausea and vomiting     Consultations:  None   Procedures (Echo, Carotid, EGD, Colonoscopy, ERCP)   Radiological studies: Dg Chest 2 View  Result Date: 08/29/2016 CLINICAL DATA:  Shortness of breath, cough EXAM: CHEST  2 VIEW COMPARISON:  12/10/2015, 10/27/2015 FINDINGS: Post sternotomy changes. Ill-defined bibasilar opacity. No pleural effusion stable borderline to mild cardiomegaly with atherosclerosis.  Possible tiny pleural fluid on lateral view. Degenerative changes of the spine peer postsurgical changes of the left humeral head. IMPRESSION: 1. Ill-defined bibasilar opacities may reflect mild infiltrates. Possible tiny pleural fluid on lateral image. 2. Stable borderline cardiomegaly without edema Electronically Signed   By: Jasmine PangKim  Fujinaga M.D.   On: 08/29/2016 14:48     Subjective:  Discharge Exam: Vitals:   08/31/16 1007 08/31/16 1130 08/31/16 2003 09/01/16 0527  BP: (!) 124/44 (!) 153/55 (!) 170/71 (!) 166/68  Pulse: 75 64 72 68  Resp:   18 18  Temp:  97.7 F (36.5 C) 97.5 F (36.4 C) 97.6 F (36.4 C)  TempSrc:  Oral Oral Oral  SpO2:  96% 96% 93%  Weight:    84.8 kg (187 lb)  Height:       General: Pt is alert, awake, not in acute distress Cardiovascular: RRR, S1/S2 +, no rubs, no gallops Respiratory: CTA bilaterally, no wheezing, no rhonchi Abdominal: Soft, NT, ND, bowel sounds + Extremities: no edema, no cyanosis   The results of significant diagnostics from this hospitalization (including imaging, microbiology, ancillary and laboratory) are listed below for reference.    Microbiology: No results found for this or any previous visit (from the past 240 hour(s)).   Labs: BNP (last 3 results)  Recent Labs  10/06/15 1838 08/29/16 1745  BNP 183.1* 299.5*   Basic Metabolic Panel:  Recent Labs Lab 08/29/16 1330  08/29/16 2145 08/30/16 0217 08/30/16 0713 08/31/16 0354 09/01/16 0405  NA 137  --   --   --  136 134* 136  K 5.6*  < > 5.1 4.9 5.2*  5.1 5.4* 4.2  CL 103  --   --   --  103 101 102  CO2 24  --   --   --  24 24 27   GLUCOSE 220*  --   --   --  349* 395* 343*  BUN 21*  --   --   --  27* 45* 46*  CREATININE 1.49*  --   --   --  1.44* 1.71* 1.47*  CALCIUM 9.4  --   --   --  9.3 8.9 8.9  < > = values in this interval not displayed. Liver Function Tests:  Recent Labs Lab 08/29/16 1745  AST 16  ALT 14*  ALKPHOS 71  BILITOT 0.7  PROT 6.8  ALBUMIN  3.1*   No results for input(s): LIPASE, AMYLASE in the last 168 hours. No results for  input(s): AMMONIA in the last 168 hours. CBC:  Recent Labs Lab 08/29/16 1330 08/30/16 0217 08/31/16 0354  WBC 6.5 5.1 12.7*  NEUTROABS  --  4.4  --   HGB 12.0* 11.2* 10.5*  HCT 38.3* 34.7* 33.5*  MCV 95.0 93.8 93.6  PLT 280 265 283   Cardiac Enzymes: No results for input(s): CKTOTAL, CKMB, CKMBINDEX, TROPONINI in the last 168 hours. BNP: Invalid input(s): POCBNP CBG:  Recent Labs Lab 08/31/16 0746 08/31/16 1129 08/31/16 2102 09/01/16 0520 09/01/16 0751  GLUCAP 305* 336* 378* 280* 342*   D-Dimer No results for input(s): DDIMER in the last 72 hours. Hgb A1c No results for input(s): HGBA1C in the last 72 hours. Lipid Profile No results for input(s): CHOL, HDL, LDLCALC, TRIG, CHOLHDL, LDLDIRECT in the last 72 hours. Thyroid function studies No results for input(s): TSH, T4TOTAL, T3FREE, THYROIDAB in the last 72 hours.  Invalid input(s): FREET3 Anemia work up No results for input(s): VITAMINB12, FOLATE, FERRITIN, TIBC, IRON, RETICCTPCT in the last 72 hours. Urinalysis    Component Value Date/Time   COLORURINE YELLOW 10/07/2015 0105   APPEARANCEUR CLEAR 10/07/2015 0105   LABSPEC 1.037 (H) 10/07/2015 0105   PHURINE 5.5 10/07/2015 0105   GLUCOSEU NEGATIVE 10/07/2015 0105   HGBUR NEGATIVE 10/07/2015 0105   BILIRUBINUR NEGATIVE 10/07/2015 0105   KETONESUR NEGATIVE 10/07/2015 0105   PROTEINUR 100 (A) 10/07/2015 0105   NITRITE NEGATIVE 10/07/2015 0105   LEUKOCYTESUR NEGATIVE 10/07/2015 0105   Sepsis Labs Invalid input(s): PROCALCITONIN,  WBC,  LACTICIDVEN Microbiology No results found for this or any previous visit (from the past 240 hour(s)).   Time coordinating discharge: Over 30 minutes  SIGNED:   Clint Lipps, MD  [facility] 09/01/2016, 10:32 AM Pager   If 7PM-7AM, please contact night-coverage [email or web address] Password [email or web address]

## 2016-09-01 NOTE — Progress Notes (Signed)
Patient ambulated the hall and oxygen saturation 91-93% on room air.  Patient does have some shortness of breath but resolves soon after resting.

## 2016-09-01 NOTE — Progress Notes (Signed)
Inpatient Diabetes Program Recommendations  AACE/ADA: New Consensus Statement on Inpatient Glycemic Control (2015)  Target Ranges:  Prepandial:   less than 140 mg/dL      Peak postprandial:   less than 180 mg/dL (1-2 hours)      Critically ill patients:  140 - 180 mg/dL   Results for Timothy Cordova, Gibson O (MRN [id number]) as of 09/01/2016 11:29  Ref. Range 08/31/2016 07:46 08/31/2016 11:29 08/31/2016 21:02 09/01/2016 05:20 09/01/2016 07:51  Glucose-Capillary Latest Ref Range: 65 - 99 mg/dL 409305 (H) 811336 (H) 914378 (H) 280 (H) 342 (H)   Review of Glycemic Control  Diabetes history:DM2 Outpatient Diabetes medications: Glipizide 5 mg BID Current orders for Inpatient glycemic control: Novolog 0-15 units TID with meals, Novolog 0-5 units QHS, Novolog 3 units TID with meals  Inpatient Diabetes Program Recommendations: Insulin - Basal:Noted steroids have been changed from IV to PO and decreased dose. Glucose in the 300's this morning. While inpatient and ordered steroids, please consider ordering Lantus 10 units Q24H. Insulin - Meal Coverage:Please consider increasing meal coverage to Novolog 5 units TID with meals.  Thanks,  Christena DeemShannon Malachi Kinzler RN, MSN, [facility] Inpatient Diabetes Coordinator Team Pager [phone] (8a-5p)

## 2016-09-01 NOTE — Progress Notes (Signed)
Patient seems to be wheezing and labored in breathing, he says that's normal for him due to his COPD. Oxygen set up and laid beside patient, he refuses to wear it.

## 2016-09-04 ENCOUNTER — Emergency Department
Admission: EM | Admit: 2016-09-04 | Discharge: 2016-09-04 | Disposition: A | Discharge status: Home / Self Care | Payer: Medicare PPO | Attending: Emergency Medicine | Admitting: Emergency Medicine

## 2016-09-04 ENCOUNTER — Encounter: Payer: Self-pay | Admitting: Emergency Medicine

## 2016-09-04 DIAGNOSIS — S61021A Laceration with foreign body of right thumb without damage to nail, initial encounter: Secondary | ICD-10-CM | POA: Diagnosis not present

## 2016-09-04 DIAGNOSIS — Z87891 Personal history of nicotine dependence: Secondary | ICD-10-CM | POA: Insufficient documentation

## 2016-09-04 DIAGNOSIS — Z7984 Long term (current) use of oral hypoglycemic drugs: Secondary | ICD-10-CM | POA: Insufficient documentation

## 2016-09-04 DIAGNOSIS — J449 Chronic obstructive pulmonary disease, unspecified: Secondary | ICD-10-CM | POA: Insufficient documentation

## 2016-09-04 DIAGNOSIS — N183 Chronic kidney disease, stage 3 (moderate): Secondary | ICD-10-CM | POA: Diagnosis not present

## 2016-09-04 DIAGNOSIS — Z79899 Other long term (current) drug therapy: Secondary | ICD-10-CM | POA: Insufficient documentation

## 2016-09-04 DIAGNOSIS — I5032 Chronic diastolic (congestive) heart failure: Secondary | ICD-10-CM | POA: Insufficient documentation

## 2016-09-04 DIAGNOSIS — I130 Hypertensive heart and chronic kidney disease with heart failure and stage 1 through stage 4 chronic kidney disease, or unspecified chronic kidney disease: Secondary | ICD-10-CM | POA: Insufficient documentation

## 2016-09-04 DIAGNOSIS — E1122 Type 2 diabetes mellitus with diabetic chronic kidney disease: Secondary | ICD-10-CM | POA: Insufficient documentation

## 2016-09-04 DIAGNOSIS — Z1810 Retained metal fragments, unspecified: Secondary | ICD-10-CM | POA: Diagnosis not present

## 2016-09-04 DIAGNOSIS — I110 Hypertensive heart disease with heart failure: Secondary | ICD-10-CM | POA: Insufficient documentation

## 2016-09-04 DIAGNOSIS — W01118A Fall on same level from slipping, tripping and stumbling with subsequent striking against other sharp object, initial encounter: Secondary | ICD-10-CM | POA: Insufficient documentation

## 2016-09-04 DIAGNOSIS — Y92009 Unspecified place in unspecified non-institutional (private) residence as the place of occurrence of the external cause: Secondary | ICD-10-CM | POA: Diagnosis not present

## 2016-09-04 DIAGNOSIS — Y999 Unspecified external cause status: Secondary | ICD-10-CM | POA: Insufficient documentation

## 2016-09-04 DIAGNOSIS — Y939 Activity, unspecified: Secondary | ICD-10-CM | POA: Insufficient documentation

## 2016-09-04 MED ORDER — BACITRACIN ZINC 500 UNIT/GM EX OINT: 1.0000 "application " | TOPICAL_OINTMENT | Freq: Two times a day (BID) | CUTANEOUS | Status: DC

## 2016-09-04 MED ORDER — BACITRACIN ZINC 500 UNIT/GM EX OINT
TOPICAL_OINTMENT | CUTANEOUS | Status: DC
Start: 2016-09-04 — End: 2016-09-04
  Filled 2016-09-04: qty 0.9

## 2016-09-04 MED ORDER — LIDOCAINE HCL (PF) 1 % IJ SOLN
INTRAMUSCULAR | Status: AC
  Administered 2016-09-04: 5 mL
  Filled 2016-09-04: qty 5

## 2016-09-04 NOTE — ED Triage Notes (Signed)
Pt reports cutting finger today on barbed wire while feeding the chickens this morning. Pt has laceration to right thumb.

## 2016-09-04 NOTE — ED Notes (Signed)
See triage note  States he tripped and cut his right thumb on wire   Bleeding controlled

## 2016-09-05 NOTE — ED Provider Notes (Signed)
[facility] Emergency Department Provider Note  ____________________________________________  Time seen: Approximately 8:58 AM  I have reviewed the triage vital signs and the nursing notes.   HISTORY  Chief Complaint Laceration   HPI Timothy DAUGHTRIDGE is a 83 y.o. male who presents to the emergency department for evaluation of a laceration on his right thumb that he sustained while out feeding his chickens this morning. He states that he tripped over a board and his hand went into a barb wire fence. He denies falling or striking his head. He denies any other injury. Tetanus shot is up-to-date. Bleeding is well controlled and he has full mobility of his thumb and right hand.  Past Medical History:  Diagnosis Date  . Allergic rhinitis   . Benign prostatic hypertrophy   . Chronic diastolic heart failure (HCC)    8/08 ECHO with EF 55%  . CKD (chronic kidney disease)   . COPD (chronic obstructive pulmonary disease) (HCC)    moderate. followed by Dr.Byrum  . Coronary artery disease    s/p CABG 2006. LHC (1/10): SVG-OM and PLOM, SVG-D, and LIMA-LAD were all patent  . Dementia    (mild)--06/2006  . Diabetes mellitus   . Edema    localized. suspect diastolic CHF + venous insufficiency  . Hearing loss    left >>right  . History of tobacco abuse   . Hypercholesterolemia   . Hypertension   . Mixed hyperlipidemia   . Osteoarthritis   . Sleep apnea    ?    Patient Active Problem List   Diagnosis Date Noted  . Hyperkalemia 08/29/2016  . Solitary pulmonary nodule 12/10/2015  . Acute on chronic diastolic heart failure (HCC) 10/08/2015  . CAP (community acquired pneumonia) 10/06/2015  . Anemia 11/26/2014  . COPD with acute exacerbation (HCC) 05/21/2013  . Aortic calcification, 04/20/2005 CT Chest 04/03/2013  . Sleep apnea   . Tobacco abuse (in remission)   . Chronic low back pain 04/18/2011  . Renal lesion 03/27/2011  . CKD (chronic kidney disease) stage  3, GFR 30-59 ml/min 08/18/2010  . OSTEOARTHRITIS 12/06/2009  . DIASTOLIC HEART FAILURE, CHRONIC 08/18/2009  . Mixed hyperlipidemia 06/11/2008  . CAD, AUTOLOGOUS BYPASS GRAFT 06/07/2008  . DM (diabetes mellitus), type 2 with renal complications (HCC) 09/04/2007  . ALLERGIC RHINITIS 01/30/2007  . HEARING LOSS 07/06/2006  . Essential hypertension 07/06/2006  . BENIGN PROSTATIC HYPERTROPHY 07/06/2006  . Centrilobular emphysema (HCC) 08/08/2005    Past Surgical History:  Procedure Laterality Date  . ANGIOPLASTY  [id number]  . CARDIAC CATHETERIZATION  90s   [place], [place] x1stent  . CARDIAC CATHETERIZATION     [facility];x2 stents  . CARDIAC CATHETERIZATION  04/29/2013  . CORONARY ARTERY BYPASS GRAFT  02/2005  . ROTATOR CUFF REPAIR  2005   left, [place]    Prior to Admission medications   Medication Sig Start Date End Date Taking? Authorizing Provider  acetaminophen (TYLENOL) 325 MG tablet Take 650 mg by mouth at bedtime.    [provider]  albuterol (PROVENTIL HFA;VENTOLIN HFA) 108 (90 Base) MCG/ACT inhaler Inhale 2 puffs into the lungs every 6 (six) hours as needed for wheezing or shortness of breath. 10/09/15   Renae Fickle, MD  aspirin 81 MG tablet Take 81 mg by mouth daily.    [provider]  atorvastatin (LIPITOR) 40 MG tablet Take 40 mg by mouth daily.    [provider]  doxazosin (CARDURA) 8 MG tablet Take 4 mg by mouth daily.  [provider]  furosemide (LASIX) 20 MG tablet Take 2 tablets (40 mg total) by mouth daily. 09/01/16   Clydia LlanoElmahi, Mutaz, MD  gabapentin (NEURONTIN) 300 MG capsule Take 300 mg by mouth at bedtime.    [provider]  glipiZIDE (GLUCOTROL) 5 MG tablet Take 1 tablet (5 mg total) by mouth 2 (two) times daily before a meal. 07/19/16   Copland, Karleen HampshireSpencer, MD  guaiFENesin (MUCINEX) 600 MG 12 hr tablet Take 2 tablets (1,200 mg total) by mouth 2 (two) times daily. 09/01/16   Clydia LlanoElmahi, Mutaz, MD  levofloxacin (LEVAQUIN) 500 MG  tablet Take 1 tablet (500 mg total) by mouth daily. 09/01/16   Clydia LlanoElmahi, Mutaz, MD  metoprolol (TOPROL-XL) 200 MG 24 hr tablet Take 100 mg by mouth daily.    [provider]  Multiple Vitamins-Minerals (CENTRUM SILVER 50+MEN) TABS Take 1 tablet by mouth daily.    [provider]  Multiple Vitamins-Minerals (PRESERVISION AREDS PO) Take 1 tablet by mouth daily.     [provider]  ONE TOUCH ULTRA TEST test strip USE TO CHECK BLOOD SUGAR 2 TIMES DAILY AS DIRECTED 09/08/15   Copland, Karleen HampshireSpencer, MD  [facility] DELICA LANCETS 33G MISC  02/13/13   [provider]  Tiotropium Bromide-Olodaterol (STIOLTO RESPIMAT) 2.5-2.5 MCG/ACT AERS Inhale 2 puffs into the lungs every morning.     [provider]  traZODone (DESYREL) 50 MG tablet Take 1-2 tablets (50-100 mg total) by mouth at bedtime as needed for sleep. Patient not taking: Reported on 08/29/2016 07/03/16   Hannah Beatopland, Spencer, MD    Allergies Metformin and related; Tramadol hcl; Ace inhibitors; Atorvastatin; Codeine; Morphine sulfate; Ticlopidine hcl; and Warfarin sodium  Family History  Problem Relation Age of Onset  . Heart attack Mother   . Lung cancer Sister     Social History Social History  Substance Use Topics  . Smoking status: Former Smoker    Packs/day: 3.00    Years: 60.00    Types: Cigarettes    Quit date: 07/12/2005  . Smokeless tobacco: Never Used  . Alcohol use No    Review of Systems  Constitutional: Well appearing  Respiratory: Negative for increased of shortness of breath from his baseline  Musculoskeletal: Negative for decreased range of motion of the right hand, specifically the thumb.  Skin: Positive for laceration to the right thumb Neurological: Negative for loss of sensation or paresthesias of the right hand, specifically the right thumb. ____________________________________________   PHYSICAL EXAM:  VITAL SIGNS: ED Triage Vitals  Enc Vitals Group     BP 09/04/16 0824 (!)  143/87     Pulse Rate 09/04/16 0824 79     Resp 09/04/16 0824 20     Temp 09/04/16 0824 98.3 F (36.8 C)     Temp Source 09/04/16 0824 Oral     SpO2 09/04/16 0824 92 %     Weight --      Height --      Head Circumference --      Peak Flow --      Pain Score 09/04/16 0818 0     Pain Loc --      Pain Edu? --      Excl. in GC? --      Constitutional: Well appearing. Mouth/Throat: Mucous membranes are moist. Airways patent. Neck: Nexus criteria is negative. Full range of motion.  Cardiovascular: No active bleeding. Respiratory: Respirations are even and unlabored.. Musculoskeletal: Full range of motion throughout, specifically of the right hand and  thumb. Neurologic: Motor and sensation is intact throughout. Skin:  4 cm laceration to the medial aspect of the base of the right thumb that extends from the webspace around the palmar aspect toward the palm.  ____________________________________________   LABS (all labs ordered are listed, but only abnormal results are displayed)  Labs Reviewed - No data to display ____________________________________________  EKG  Not indicated ____________________________________________  RADIOLOGY  Not indicated ____________________________________________   PROCEDURES  Procedure(s) performed:  LACERATION REPAIR Performed by: Kem Boroughs  Consent: Verbal consent obtained.  Consent given by: patient  Prepped and Draped in normal sterile fashion  Wound explored: Several black specks suspected be from the barb wire were removed by irrigation and sharp excision.  Laceration Location: Right thumb  Laceration Length: 4cm  Anesthesia: local  Local anesthetic: lidocaine 1% without epinephrine  Anesthetic total: 4 ml  Irrigation method: syringe  Amount of cleaning: Copious  Skin closure: 5-0 Vicryl; 5-0 Nylon  Number of sutures: Running with 4 throws subcutaneously; #8 simple interruped dermal  Technique:  Running/simple interrupted.  Patient tolerance: Patient tolerated the procedure well with no immediate complications.  ____________________________________________   INITIAL IMPRESSION / ASSESSMENT AND PLAN / ED COURSE  DAVIEL ALLEGRETTO is a 83 y.o. male who presents to the emergency department after tripping over a board and running his hand through a barb wire fence. Tetanus shot is up-to-date. Wound to the right thumb was easily reapproximatedwith sutures and the patient tolerated the procedure well. He is currently taking Keflex after recent hospitalization for pneumonia. He was instructed to continue the Keflex until finished. He was instructed to follow-up with his primary care provider for any sign or concern of infection. He is instructed to have the sutures removed in about 10 days. He was instructed to return to the emergency department for any symptoms of concern if he is unable to see his primary care provider.   Pertinent labs & imaging results that were available during my care of the patient were reviewed by me and considered in my medical decision making (see chart for details). ____________________________________________   FINAL CLINICAL IMPRESSION(S) / ED DIAGNOSES  Final diagnoses:  Laceration of right thumb with foreign body without damage to nail, initial encounter    Discharge Medication List as of 09/04/2016  9:44 AM      If controlled substance prescribed during this visit, 12 month history viewed on the [facility] prior to issuing an initial prescription for Schedule II or III opiod.   Note:  This document was prepared using Dragon voice recognition software and may include unintentional dictation errors.    Chinita Pester, FNP 09/05/16 1129    Minna Antis, MD 09/05/16 1459

## 2016-09-11 ENCOUNTER — Other Ambulatory Visit: Payer: Self-pay | Admitting: *Deleted

## 2016-09-11 NOTE — Patient Outreach (Signed)
[facility] Care Management  09/11/2016  Timothy Cordova 1929/03/30 [id number]  Referral via EMMI-Pneumonia for wheezing more-day 3 and confusion day 2:  Telephone call to patient who advised that he did not wish to receive any more automated calls because he does not wish to talk to any machines & states that he did not have pneumonia based on follow up [facility] post [facility] stay.   Patient voices that he has never had wheezing. States he was admitted to hospital because of increasing problems with shortness of breath. States he does have a history of emphysema and takes medication daily to control condition. Voices that he has albuterol to use if he has severe problem with condition. States he has not had to use since discharged to home.   Voices that he has a scheduled appointment with primary care MD and plans to discuss his condition & get a clear diagnosis. States he is driving & family member will assist to appointments as needed.   Review of symptoms that require calling 911 reviewed with patient who voices understanding. Patient voices he has family support.  [facility] services but has contact information if he needs to call for [facility] services.   Plan: Send to care management assistant to close case & discontinue EMMI- calls at request of patient.   Timothy CanLinda Aladdin Kollmann, RN BSN CCM Care Management Coordinator [facility] Care Management  [phone]

## 2016-09-18 ENCOUNTER — Encounter: Payer: Self-pay | Admitting: Family Medicine

## 2016-09-18 ENCOUNTER — Ambulatory Visit (INDEPENDENT_AMBULATORY_CARE_PROVIDER_SITE_OTHER): Payer: Medicare PPO | Admitting: Family Medicine

## 2016-09-18 VITALS — BP 165/82 | HR 70 | Temp 98.3°F | Ht 66.0 in | Wt 184.5 lb

## 2016-09-18 DIAGNOSIS — J441 Chronic obstructive pulmonary disease with (acute) exacerbation: Secondary | ICD-10-CM | POA: Diagnosis not present

## 2016-09-18 DIAGNOSIS — N183 Chronic kidney disease, stage 3 unspecified: Secondary | ICD-10-CM

## 2016-09-18 DIAGNOSIS — I5032 Chronic diastolic (congestive) heart failure: Secondary | ICD-10-CM

## 2016-09-18 DIAGNOSIS — J189 Pneumonia, unspecified organism: Secondary | ICD-10-CM | POA: Diagnosis not present

## 2016-09-18 DIAGNOSIS — Z79899 Other long term (current) drug therapy: Secondary | ICD-10-CM | POA: Diagnosis not present

## 2016-09-18 NOTE — Progress Notes (Signed)
Dr. Karleen HampshireSpencer T. Exavior Kimmons, MD, CAQ Sports Medicine Primary Care and Sports Medicine [address] [place], [address] Phone: [phone] Fax: [phone]  09/18/2016  Patient: Timothy Postinhomas O Cordova, MRN: [id number], DOB: 13-Mar-1929, 83 y.o.  Primary Physician:  Hannah Beatopland, Reighlyn Elmes, MD   Chief Complaint  Patient presents with  . Hospitalization Follow-up    Dyspnea on Exertion  . Suture / Staple Removal    Right Thumb   Subjective:   Timothy Cordova is a 83 y.o. very pleasant male patient who presents with the following:  Admit date: 08/29/2016 Discharge date: 09/01/2016  CBC BMP  Upset with [facility]. Could not really take a deep breath - [facility]  - felt double pneumonia. In hospital for three days. This seems to be some confusion and lack of communication from the inpatient team with the patient and his family.  He thinks that he was admitted and discharged with a primary diagnosis of congestive heart failure.  Per the medical record, the patient was admitted with community-acquired pneumonia, required IV Rocephin as and azithromycin, and transitioned to levofloxacin for 5 days of outpatient treatment.  He also has COPD, and acute exacerbation requiring steroids.  He does have grade 1 diastolic dysfunction with normal systolic function, previously treated with beta blocker and Lasix at 20 mg only.  Beta blockers were continued and the increase his Lasix to 40 mg.  The patient was lost 5 pounds since time of discharge.  Creatinine was 1.4 at time of discharge.  Past Medical History, Surgical History, Social History, Family History, Problem List, Medications, and Allergies have been reviewed and updated if relevant.  Patient Active Problem List   Diagnosis Date Noted  . CKD (chronic kidney disease) stage 3, GFR 30-59 ml/min 08/18/2010    Priority: High  . DIASTOLIC HEART FAILURE, CHRONIC 08/18/2009    Priority: High  . CAD, AUTOLOGOUS BYPASS  GRAFT 06/07/2008    Priority: High  . DM (diabetes mellitus), type 2 with renal complications (HCC) 09/04/2007    Priority: High  . Centrilobular emphysema (HCC) 08/08/2005    Priority: High  . Hyperkalemia 08/29/2016  . Solitary pulmonary nodule 12/10/2015  . Acute on chronic diastolic heart failure (HCC) 10/08/2015  . CAP (community acquired pneumonia) 10/06/2015  . Anemia 11/26/2014  . COPD with acute exacerbation (HCC) 05/21/2013  . Aortic calcification, 04/20/2005 CT Chest 04/03/2013  . Sleep apnea   . Tobacco abuse (in remission)   . Chronic low back pain 04/18/2011  . Renal lesion 03/27/2011  . OSTEOARTHRITIS 12/06/2009  . Mixed hyperlipidemia 06/11/2008  . ALLERGIC RHINITIS 01/30/2007  . HEARING LOSS 07/06/2006  . Essential hypertension 07/06/2006  . BENIGN PROSTATIC HYPERTROPHY 07/06/2006    Past Medical History:  Diagnosis Date  . Allergic rhinitis   . Benign prostatic hypertrophy   . Chronic diastolic heart failure (HCC)    8/08 ECHO with EF 55%  . CKD (chronic kidney disease)   . COPD (chronic obstructive pulmonary disease) (HCC)    moderate. followed by Dr.Byrum  . Coronary artery disease    s/p CABG 2006. LHC (1/10): SVG-OM and PLOM, SVG-D, and LIMA-LAD were all patent  . Dementia    (mild)--06/2006  . Diabetes mellitus   . Edema    localized. suspect diastolic CHF + venous insufficiency  . Hearing loss    left >>right  . History of tobacco abuse   . Hypercholesterolemia   . Hypertension   .  Mixed hyperlipidemia   . Osteoarthritis   . Sleep apnea    ?    Past Surgical History:  Procedure Laterality Date  . ANGIOPLASTY  [id number]  . CARDIAC CATHETERIZATION  90s   [place], [place] x1stent  . CARDIAC CATHETERIZATION     [facility];x2 stents  . CARDIAC CATHETERIZATION  04/29/2013  . CORONARY ARTERY BYPASS GRAFT  02/2005  . ROTATOR CUFF REPAIR  2005   left, Gioffre    Social History   Social History  . Marital status: Married    Spouse name: N/A  .  Number of children: N/A  . Years of education: N/A   Occupational History  . rubber fabrication Retired    RETIRED   Social History Main Topics  . Smoking status: Former Smoker    Packs/day: 3.00    Years: 60.00    Types: Cigarettes    Quit date: 07/12/2005  . Smokeless tobacco: Never Used  . Alcohol use No  . Drug use: No  . Sexual activity: Not on file   Other Topics Concern  . Not on file   Social History Narrative  . No narrative on file    Family History  Problem Relation Age of Onset  . Heart attack Mother   . Lung cancer Sister     Allergies  Allergen Reactions  . Metformin And Related Nausea And Vomiting  . Tramadol Hcl     Tremor vs seizure activity  . Ace Inhibitors     REACTION: cough  . Atorvastatin     REACTION: weakness. ( currently on this med at home)  . Codeine Nausea And Vomiting  . Morphine Sulfate Nausea And Vomiting    REACTION: unspecified  . Ticlopidine Hcl     REACTION: unspecified  . Warfarin Sodium Other (See Comments)    Nausea and vomiting     Medication list reviewed and updated in full in [facility].   GEN: No acute illnesses, no fevers, chills. GI: No n/v/d, eating normally Pulm: No SOB Interactive and getting along well at home.  Otherwise, ROS is as per the HPI.  Objective:   BP (!) 165/82   Pulse 70   Temp 98.3 F (36.8 C) (Oral)   Ht 5\' 6"  (1.676 m)   Wt 184 lb 8 oz (83.7 kg)   BMI 29.78 kg/m   GEN: WDWN, NAD, Non-toxic, A & O x 3 HEENT: Atraumatic, Normocephalic. Neck supple. No masses, No LAD. Ears and Nose: No external deformity. CV: RRR, No M/G/R. No JVD. No thrill. No extra heart sounds. PULM: CTA B, no wheezes, crackles, scattered B rhonchi. No retractions. No resp. distress. No accessory muscle use. EXTR: No c/c/e NEURO Normal gait.  PSYCH: Normally interactive. Conversant. Not depressed or anxious appearing.  Calm demeanor.   Laboratory and Imaging Data:  Assessment and Plan:   Community  acquired pneumonia, unspecified laterality  CKD (chronic kidney disease) stage 3, GFR 30-59 ml/min - Plan: Basic metabolic panel  Encounter for long-term (current) use of medications - Plan: CBC with Differential/Platelet  DIASTOLIC HEART FAILURE, CHRONIC  COPD with acute exacerbation (HCC)  Reviewed and clarified everything with the patient.  Primary issue is if his renal function can take the increase in his diuretics.  If so, then he will likely have a decreased baseline weight.  He has been weighing himself daily.  Excellent compliance with COPD medications.  Medications Discontinued During This Encounter  Medication Reason  . traZODone (DESYREL) 50 MG tablet Completed  Course  . levofloxacin (LEVAQUIN) 500 MG tablet Completed Course  . guaiFENesin (MUCINEX) 600 MG 12 hr tablet Completed Course  . gabapentin (NEURONTIN) 300 MG capsule    Orders Placed This Encounter  Procedures  . Basic metabolic panel  . CBC with Differential/Platelet    Signed,  Karleen Hampshire T. Basem Yannuzzi, MD   Allergies as of 09/18/2016      Reactions   Metformin And Related Nausea And Vomiting   Tramadol Hcl    Tremor vs seizure activity   Ace Inhibitors    REACTION: cough   Atorvastatin    REACTION: weakness. ( currently on this med at home)   Codeine Nausea And Vomiting   Morphine Sulfate Nausea And Vomiting   REACTION: unspecified   Ticlopidine Hcl    REACTION: unspecified   Warfarin Sodium Other (See Comments)   Nausea and vomiting       Medication List       Accurate as of 09/18/16 11:59 PM. Always use your most recent med list.          acetaminophen 325 MG tablet Commonly known as:  TYLENOL Take 650 mg by mouth at bedtime.   albuterol 108 (90 Base) MCG/ACT inhaler Commonly known as:  PROVENTIL HFA;VENTOLIN HFA Inhale 2 puffs into the lungs every 6 (six) hours as needed for wheezing or shortness of breath.   aspirin 81 MG tablet Take 81 mg by mouth daily.   atorvastatin 40 MG  tablet Commonly known as:  LIPITOR Take 40 mg by mouth daily.   doxazosin 8 MG tablet Commonly known as:  CARDURA Take 4 mg by mouth daily.   furosemide 20 MG tablet Commonly known as:  LASIX Take 2 tablets (40 mg total) by mouth daily.   glipiZIDE 5 MG tablet Commonly known as:  GLUCOTROL Take 1 tablet (5 mg total) by mouth 2 (two) times daily before a meal.   metoprolol 200 MG 24 hr tablet Commonly known as:  TOPROL-XL Take 100 mg by mouth daily.   ONE TOUCH ULTRA TEST test strip Generic drug:  glucose blood USE TO CHECK BLOOD SUGAR 2 TIMES DAILY AS DIRECTED   ONETOUCH DELICA LANCETS 33G Misc   PRESERVISION AREDS PO Take 1 tablet by mouth daily.   CENTRUM SILVER 50+MEN Tabs Take 1 tablet by mouth daily.   STIOLTO RESPIMAT 2.5-2.5 MCG/ACT Aers Generic drug:  Tiotropium Bromide-Olodaterol Inhale 2 puffs into the lungs every morning.

## 2016-09-18 NOTE — Progress Notes (Signed)
8 sutures removed from Right Thumb.  Patient tolerated well.

## 2016-09-19 ENCOUNTER — Other Ambulatory Visit: Payer: Self-pay | Admitting: Family Medicine

## 2016-09-19 LAB — BASIC METABOLIC PANEL
BUN: 51 mg/dL — ABNORMAL HIGH (ref 6–23)
CALCIUM: 9.7 mg/dL (ref 8.4–10.5)
CHLORIDE: 99 meq/L (ref 96–112)
CO2: 33 meq/L — AB (ref 19–32)
Creatinine, Ser: 2.02 mg/dL — ABNORMAL HIGH (ref 0.40–1.50)
GFR: 33.42 mL/min — ABNORMAL LOW (ref >60.00)
Glucose, Bld: 279 mg/dL — ABNORMAL HIGH (ref 70–99)
POTASSIUM: 5.3 meq/L — AB (ref 3.5–5.1)
SODIUM: 136 meq/L (ref 135–145)

## 2016-09-19 LAB — CBC WITH DIFFERENTIAL/PLATELET
BASOS ABS: 0 10*3/uL (ref 0.0–0.1)
BASOS PCT: 0.2 % (ref 0.0–3.0)
Eosinophils Absolute: 0.3 10*3/uL (ref 0.0–0.7)
Eosinophils Relative: 4.3 % (ref 0.0–5.0)
HEMATOCRIT: 37.4 % — AB (ref 39.0–52.0)
Hemoglobin: 12.3 g/dL — ABNORMAL LOW (ref 13.0–17.0)
LYMPHS PCT: 20.1 % (ref 12.0–46.0)
Lymphs Abs: 1.4 10*3/uL (ref 0.7–4.0)
MCHC: 32.8 g/dL (ref 30.0–36.0)
MCV: 93.8 fl (ref 78.0–100.0)
MONOS PCT: 11.1 % (ref 3.0–12.0)
Monocytes Absolute: 0.8 10*3/uL (ref 0.1–1.0)
NEUTROS ABS: 4.5 10*3/uL (ref 1.4–7.7)
Neutrophils Relative %: 64.3 % (ref 43.0–77.0)
PLATELETS: 161 10*3/uL (ref 150.0–400.0)
RBC: 3.99 Mil/uL — ABNORMAL LOW (ref 4.22–5.81)
RDW: 14.1 % (ref 11.5–15.5)
WBC: 7 10*3/uL (ref 4.0–10.5)

## 2016-09-19 MED ORDER — FUROSEMIDE 20 MG PO TABS: 20.0000 mg | ORAL_TABLET | Freq: Every day | ORAL | 0 refills | Status: DC

## 2016-09-21 ENCOUNTER — Other Ambulatory Visit: Payer: Self-pay | Admitting: Family Medicine

## 2016-09-21 DIAGNOSIS — N183 Chronic kidney disease, stage 3 unspecified: Secondary | ICD-10-CM

## 2016-09-25 ENCOUNTER — Other Ambulatory Visit (INDEPENDENT_AMBULATORY_CARE_PROVIDER_SITE_OTHER): Payer: Medicare PPO

## 2016-09-25 DIAGNOSIS — N183 Chronic kidney disease, stage 3 unspecified: Secondary | ICD-10-CM

## 2016-09-25 LAB — BASIC METABOLIC PANEL
BUN: 45 mg/dL — AB (ref 6–23)
CHLORIDE: 102 meq/L (ref 96–112)
CO2: 30 meq/L (ref 19–32)
Calcium: 9.5 mg/dL (ref 8.4–10.5)
Creatinine, Ser: 1.71 mg/dL — ABNORMAL HIGH (ref 0.40–1.50)
GFR: 40.51 mL/min — AB (ref >60.00)
GLUCOSE: 177 mg/dL — AB (ref 70–99)
POTASSIUM: 5.3 meq/L — AB (ref 3.5–5.1)
Sodium: 137 mEq/L (ref 135–145)

## 2016-09-26 ENCOUNTER — Other Ambulatory Visit: Payer: Medicare PPO

## 2016-10-14 ENCOUNTER — Inpatient Hospital Stay (HOSPITAL_COMMUNITY)
Admission: EM | Admit: 2016-10-14 | Discharge: 2016-10-17 | DRG: 190 | Disposition: A | Discharge status: Home / Self Care | Payer: Medicare PPO | Attending: Internal Medicine | Admitting: Internal Medicine

## 2016-10-14 ENCOUNTER — Encounter (HOSPITAL_COMMUNITY): Payer: Self-pay | Admitting: Emergency Medicine

## 2016-10-14 ENCOUNTER — Emergency Department (HOSPITAL_COMMUNITY): Payer: Medicare PPO

## 2016-10-14 DIAGNOSIS — R0600 Dyspnea, unspecified: Secondary | ICD-10-CM

## 2016-10-14 DIAGNOSIS — I25810 Atherosclerosis of coronary artery bypass graft(s) without angina pectoris: Secondary | ICD-10-CM

## 2016-10-14 DIAGNOSIS — Z7982 Long term (current) use of aspirin: Secondary | ICD-10-CM

## 2016-10-14 DIAGNOSIS — N281 Cyst of kidney, acquired: Secondary | ICD-10-CM | POA: Diagnosis present

## 2016-10-14 DIAGNOSIS — Z87891 Personal history of nicotine dependence: Secondary | ICD-10-CM | POA: Diagnosis not present

## 2016-10-14 DIAGNOSIS — R7989 Other specified abnormal findings of blood chemistry: Secondary | ICD-10-CM | POA: Diagnosis present

## 2016-10-14 DIAGNOSIS — I2510 Atherosclerotic heart disease of native coronary artery without angina pectoris: Secondary | ICD-10-CM | POA: Diagnosis present

## 2016-10-14 DIAGNOSIS — I130 Hypertensive heart and chronic kidney disease with heart failure and stage 1 through stage 4 chronic kidney disease, or unspecified chronic kidney disease: Secondary | ICD-10-CM | POA: Diagnosis present

## 2016-10-14 DIAGNOSIS — G4733 Obstructive sleep apnea (adult) (pediatric): Secondary | ICD-10-CM | POA: Diagnosis present

## 2016-10-14 DIAGNOSIS — J9601 Acute respiratory failure with hypoxia: Secondary | ICD-10-CM

## 2016-10-14 DIAGNOSIS — Z79899 Other long term (current) drug therapy: Secondary | ICD-10-CM

## 2016-10-14 DIAGNOSIS — H9190 Unspecified hearing loss, unspecified ear: Secondary | ICD-10-CM | POA: Diagnosis present

## 2016-10-14 DIAGNOSIS — Z951 Presence of aortocoronary bypass graft: Secondary | ICD-10-CM

## 2016-10-14 DIAGNOSIS — J432 Centrilobular emphysema: Secondary | ICD-10-CM

## 2016-10-14 DIAGNOSIS — N421 Congestion and hemorrhage of prostate: Secondary | ICD-10-CM | POA: Diagnosis present

## 2016-10-14 DIAGNOSIS — R778 Other specified abnormalities of plasma proteins: Secondary | ICD-10-CM | POA: Diagnosis present

## 2016-10-14 DIAGNOSIS — J441 Chronic obstructive pulmonary disease with (acute) exacerbation: Secondary | ICD-10-CM | POA: Diagnosis not present

## 2016-10-14 DIAGNOSIS — R0602 Shortness of breath: Secondary | ICD-10-CM | POA: Diagnosis not present

## 2016-10-14 DIAGNOSIS — I5032 Chronic diastolic (congestive) heart failure: Secondary | ICD-10-CM | POA: Diagnosis not present

## 2016-10-14 DIAGNOSIS — I248 Other forms of acute ischemic heart disease: Secondary | ICD-10-CM | POA: Diagnosis not present

## 2016-10-14 DIAGNOSIS — N183 Chronic kidney disease, stage 3 unspecified: Secondary | ICD-10-CM | POA: Diagnosis present

## 2016-10-14 DIAGNOSIS — R310 Gross hematuria: Secondary | ICD-10-CM | POA: Diagnosis not present

## 2016-10-14 DIAGNOSIS — E1122 Type 2 diabetes mellitus with diabetic chronic kidney disease: Secondary | ICD-10-CM

## 2016-10-14 DIAGNOSIS — I5033 Acute on chronic diastolic (congestive) heart failure: Secondary | ICD-10-CM

## 2016-10-14 DIAGNOSIS — I10 Essential (primary) hypertension: Secondary | ICD-10-CM | POA: Diagnosis not present

## 2016-10-14 DIAGNOSIS — M1991 Primary osteoarthritis, unspecified site: Secondary | ICD-10-CM | POA: Diagnosis present

## 2016-10-14 DIAGNOSIS — Z888 Allergy status to other drugs, medicaments and biological substances status: Secondary | ICD-10-CM | POA: Diagnosis not present

## 2016-10-14 DIAGNOSIS — R351 Nocturia: Secondary | ICD-10-CM | POA: Diagnosis not present

## 2016-10-14 DIAGNOSIS — R748 Abnormal levels of other serum enzymes: Secondary | ICD-10-CM | POA: Diagnosis not present

## 2016-10-14 DIAGNOSIS — E782 Mixed hyperlipidemia: Secondary | ICD-10-CM | POA: Diagnosis not present

## 2016-10-14 DIAGNOSIS — N401 Enlarged prostate with lower urinary tract symptoms: Secondary | ICD-10-CM | POA: Diagnosis not present

## 2016-10-14 DIAGNOSIS — Z885 Allergy status to narcotic agent status: Secondary | ICD-10-CM | POA: Diagnosis not present

## 2016-10-14 DIAGNOSIS — E1121 Type 2 diabetes mellitus with diabetic nephropathy: Secondary | ICD-10-CM

## 2016-10-14 DIAGNOSIS — J9600 Acute respiratory failure, unspecified whether with hypoxia or hypercapnia: Secondary | ICD-10-CM | POA: Diagnosis present

## 2016-10-14 LAB — I-STAT TROPONIN, ED: TROPONIN I, POC: 0.34 ng/mL — AB (ref 0.00–0.08)

## 2016-10-14 LAB — I-STAT ARTERIAL BLOOD GAS, ED
Acid-Base Excess: 2 mmol/L (ref 0.0–2.0)
Bicarbonate: 27.1 mmol/L (ref 20.0–28.0)
O2 Saturation: 94 %
PCO2 ART: 41.3 mmHg (ref 32.0–48.0)
Patient temperature: 98.7
TCO2: 28 mmol/L (ref 0–100)
pH, Arterial: 7.425 (ref 7.350–7.450)
pO2, Arterial: 68 mmHg — ABNORMAL LOW (ref 83.0–108.0)

## 2016-10-14 LAB — COMPREHENSIVE METABOLIC PANEL
ALBUMIN: 3.4 g/dL — AB (ref 3.5–5.0)
ALK PHOS: 72 U/L (ref 38–126)
ALT: 14 U/L — ABNORMAL LOW (ref 17–63)
ANION GAP: 11 (ref 5–15)
AST: 20 U/L (ref 15–41)
BILIRUBIN TOTAL: 0.9 mg/dL (ref 0.3–1.2)
BUN: 31 mg/dL — AB (ref 6–20)
CALCIUM: 9 mg/dL (ref 8.9–10.3)
CO2: 22 mmol/L (ref 22–32)
Chloride: 100 mmol/L — ABNORMAL LOW (ref 101–111)
Creatinine, Ser: 1.76 mg/dL — ABNORMAL HIGH (ref 0.61–1.24)
GFR calc Af Amer: 39 mL/min — ABNORMAL LOW (ref >60)
GFR, EST NON AFRICAN AMERICAN: 33 mL/min — AB (ref >60)
GLUCOSE: 189 mg/dL — AB (ref 65–99)
POTASSIUM: 4.9 mmol/L (ref 3.5–5.1)
Sodium: 133 mmol/L — ABNORMAL LOW (ref 135–145)
Total Protein: 6.7 g/dL (ref 6.5–8.1)

## 2016-10-14 LAB — CBC WITH DIFFERENTIAL/PLATELET
Basophils Absolute: 0 10*3/uL (ref 0.0–0.1)
Basophils Relative: 0 %
EOS ABS: 0 10*3/uL (ref 0.0–0.7)
EOS PCT: 0 %
HCT: 39.2 % (ref 39.0–52.0)
HEMOGLOBIN: 12.2 g/dL — AB (ref 13.0–17.0)
LYMPHS ABS: 1.3 10*3/uL (ref 0.7–4.0)
Lymphocytes Relative: 12 %
MCH: 30 pg (ref 26.0–34.0)
MCHC: 31.1 g/dL (ref 30.0–36.0)
MCV: 96.3 fL (ref 78.0–100.0)
Monocytes Absolute: 1.7 10*3/uL — ABNORMAL HIGH (ref 0.1–1.0)
Monocytes Relative: 15 %
Neutro Abs: 8.3 10*3/uL — ABNORMAL HIGH (ref 1.7–7.7)
Neutrophils Relative %: 73 %
Platelets: 166 10*3/uL (ref 150–400)
RBC: 4.07 MIL/uL — ABNORMAL LOW (ref 4.22–5.81)
RDW: 14 % (ref 11.5–15.5)
WBC: 11.3 10*3/uL — AB (ref 4.0–10.5)

## 2016-10-14 LAB — TROPONIN I: Troponin I: 0.39 ng/mL (ref <0.03)

## 2016-10-14 LAB — BRAIN NATRIURETIC PEPTIDE: B NATRIURETIC PEPTIDE 5: 360.9 pg/mL — AB (ref 0.0–100.0)

## 2016-10-14 LAB — D-DIMER, QUANTITATIVE: D-Dimer, Quant: 1.42 ug/mL-FEU — ABNORMAL HIGH (ref 0.00–0.50)

## 2016-10-14 MED ORDER — METHYLPREDNISOLONE SODIUM SUCC 125 MG IJ SOLR
60.0000 mg | Freq: Two times a day (BID) | INTRAMUSCULAR | Status: AC
  Administered 2016-10-14 – 2016-10-15 (×2): 60 mg via INTRAVENOUS
  Filled 2016-10-14 (×2): qty 2

## 2016-10-14 MED ORDER — ALBUTEROL SULFATE (2.5 MG/3ML) 0.083% IN NEBU: 2.5000 mg | INHALATION_SOLUTION | RESPIRATORY_TRACT | Status: DC | PRN

## 2016-10-14 MED ORDER — IPRATROPIUM-ALBUTEROL 0.5-2.5 (3) MG/3ML IN SOLN
3.0000 mL | Freq: Four times a day (QID) | RESPIRATORY_TRACT | Status: DC
  Administered 2016-10-14 – 2016-10-15 (×2): 3 mL via RESPIRATORY_TRACT
  Filled 2016-10-14 (×3): qty 3

## 2016-10-14 MED ORDER — ALBUTEROL SULFATE (2.5 MG/3ML) 0.083% IN NEBU
5.0000 mg | INHALATION_SOLUTION | Freq: Once | RESPIRATORY_TRACT | Status: AC
  Administered 2016-10-14: 5 mg via RESPIRATORY_TRACT
  Filled 2016-10-14: qty 6

## 2016-10-14 MED ORDER — IPRATROPIUM-ALBUTEROL 0.5-2.5 (3) MG/3ML IN SOLN
3.0000 mL | Freq: Four times a day (QID) | RESPIRATORY_TRACT | Status: DC
  Administered 2016-10-15 – 2016-10-16 (×7): 3 mL via RESPIRATORY_TRACT
  Filled 2016-10-14 (×7): qty 3

## 2016-10-14 MED ORDER — PREDNISONE 20 MG PO TABS: 40.0000 mg | ORAL_TABLET | Freq: Every day | ORAL | Status: DC

## 2016-10-14 MED ORDER — METHYLPREDNISOLONE SODIUM SUCC 125 MG IJ SOLR
125.0000 mg | Freq: Once | INTRAMUSCULAR | Status: AC
  Administered 2016-10-14: 125 mg via INTRAVENOUS
  Filled 2016-10-14: qty 2

## 2016-10-14 MED ORDER — DOXYCYCLINE HYCLATE 100 MG PO TABS
100.0000 mg | ORAL_TABLET | Freq: Two times a day (BID) | ORAL | Status: DC
  Administered 2016-10-14 – 2016-10-17 (×6): 100 mg via ORAL
  Filled 2016-10-14 (×6): qty 1

## 2016-10-14 MED ORDER — FUROSEMIDE 10 MG/ML IJ SOLN
80.0000 mg | Freq: Once | INTRAMUSCULAR | Status: AC
  Administered 2016-10-14: 80 mg via INTRAVENOUS
  Filled 2016-10-14: qty 8

## 2016-10-14 NOTE — ED Provider Notes (Signed)
[facility]-EMERGENCY DEPT Provider Note   CSN: [id number] Arrival date & time: 10/14/16  1501     History   Chief Complaint Chief Complaint  Patient presents with  . Shortness of Breath    HPI Timothy Cordova is a 83 y.o. male. Chief complaint is shortness of breath  HPI:  83 year old male. History of CHF, coronary artery disease status post CABG, COPD. He reports shortness of breath worsening over the past few weeks. He was admitted for community acquired pneumonia and discharged May 25 and completed by mouth antibiotics at home. Approximately 2 weeks ago his primary care physician decreased his Lasix from 20-40 because of "my kidneys".  Despite progressive shortness of breath, he states that his weight is down 15 pounds from 196 to 181. He relates is a poor appetite. He states that if he eats, he feels too full and can't breathe.  Denies progressive edema. No fever. Cough productive of thin yellow sputum. No hemoptysis. No chest pain and no neck pain back pain jaw pain. He normally sleeps on his side that has been restless with sleep the last 2 nights and more short of breath  Past Medical History:  Diagnosis Date  . Allergic rhinitis   . Benign prostatic hypertrophy   . Chronic diastolic heart failure (HCC)    8/08 ECHO with EF 55%  . CKD (chronic kidney disease)   . COPD (chronic obstructive pulmonary disease) (HCC)    moderate. followed by Dr.Byrum  . Coronary artery disease    s/p CABG 2006. LHC (1/10): SVG-OM and PLOM, SVG-D, and LIMA-LAD were all patent  . Dementia    (mild)--06/2006  . Diabetes mellitus   . Edema    localized. suspect diastolic CHF + venous insufficiency  . Hearing loss    left >>right  . History of tobacco abuse   . Hypercholesterolemia   . Hypertension   . Mixed hyperlipidemia   . Osteoarthritis   . Sleep apnea    ?    Patient Active Problem List   Diagnosis Date Noted  . Elevated troponin level 10/14/2016  . Solitary pulmonary nodule  12/10/2015  . Anemia 11/26/2014  . Dyspnea 11/26/2014  . COPD with acute exacerbation (HCC) 05/21/2013  . Aortic calcification, 04/20/2005 CT Chest 04/03/2013  . Sleep apnea   . Tobacco abuse (in remission)   . Chronic low back pain 04/18/2011  . Renal lesion 03/27/2011  . CKD (chronic kidney disease) stage 3, GFR 30-59 ml/min 08/18/2010  . OSTEOARTHRITIS 12/06/2009  . DIASTOLIC HEART FAILURE, CHRONIC 08/18/2009  . Mixed hyperlipidemia 06/11/2008  . CAD, AUTOLOGOUS BYPASS GRAFT 06/07/2008  . DM (diabetes mellitus), type 2 with renal complications (HCC) 09/04/2007  . ALLERGIC RHINITIS 01/30/2007  . HEARING LOSS 07/06/2006  . Essential hypertension 07/06/2006  . BENIGN PROSTATIC HYPERTROPHY 07/06/2006  . Centrilobular emphysema (HCC) 08/08/2005    Past Surgical History:  Procedure Laterality Date  . ANGIOPLASTY  [id number]  . CARDIAC CATHETERIZATION  90s   [place], [place] x1stent  . CARDIAC CATHETERIZATION     [facility];x2 stents  . CARDIAC CATHETERIZATION  04/29/2013  . CORONARY ARTERY BYPASS GRAFT  02/2005  . ROTATOR CUFF REPAIR  2005   left, Gioffre       Home Medications    Prior to Admission medications   Medication Sig Start Date End Date Taking? Authorizing Provider  albuterol (PROVENTIL HFA;VENTOLIN HFA) 108 (90 Base) MCG/ACT inhaler Inhale 2 puffs into the lungs every 6 (six) hours as needed for wheezing or  shortness of breath. 10/09/15  Yes Short, Thea Silversmith, MD  aspirin 81 MG tablet Take 81 mg by mouth daily.   Yes [provider]  atorvastatin (LIPITOR) 40 MG tablet Take 40 mg by mouth daily.   Yes [provider]  Carboxymethylcellulose Sod PF 0.25 % SOLN Place 1 drop into both eyes 4 (four) times daily.    Yes [provider]  diphenhydramine-acetaminophen (TYLENOL PM) 25-500 MG TABS tablet Take 1 tablet by mouth at bedtime as needed (for sleep).    Yes [provider]  doxazosin (CARDURA) 8 MG tablet Take 4 mg by mouth at bedtime.    Yes  [provider]  furosemide (LASIX) 20 MG tablet Take 1 tablet (20 mg total) by mouth daily. 09/19/16  Yes Copland, Karleen Hampshire, MD  glipiZIDE (GLUCOTROL) 5 MG tablet Take 1 tablet (5 mg total) by mouth 2 (two) times daily before a meal. 07/19/16  Yes Copland, Karleen Hampshire, MD  metoprolol (TOPROL-XL) 200 MG 24 hr tablet Take 100 mg by mouth daily.   Yes [provider]  Multiple Vitamins-Minerals (CENTRUM SILVER 50+MEN) TABS Take 1 tablet by mouth daily.   Yes [provider]  Multiple Vitamins-Minerals (PRESERVISION AREDS PO) Take 1 tablet by mouth daily.    Yes [provider]  Tiotropium Bromide-Olodaterol (STIOLTO RESPIMAT) 2.5-2.5 MCG/ACT AERS Inhale 2 puffs into the lungs every morning.    Yes [provider]  ONE TOUCH ULTRA TEST test strip USE TO CHECK BLOOD SUGAR 2 TIMES DAILY AS DIRECTED 09/08/15   Copland, Karleen Hampshire, MD  [facility] DELICA LANCETS 33G MISC  02/13/13   [provider]    Family History Family History  Problem Relation Age of Onset  . Heart attack Mother   . Lung cancer Sister     Social History Social History  Substance Use Topics  . Smoking status: Former Smoker    Packs/day: 3.00    Years: 60.00    Types: Cigarettes    Quit date: 07/12/2005  . Smokeless tobacco: Never Used  . Alcohol use No     Allergies   Metformin and related; Tramadol hcl; Ace inhibitors; Atorvastatin; Codeine; Gabapentin; Morphine sulfate; Ticlopidine hcl; and Warfarin sodium   Review of Systems Review of Systems  Constitutional: Negative for appetite change, chills, diaphoresis, fatigue and fever.  HENT: Negative for mouth sores, sore throat and trouble swallowing.   Eyes: Negative for visual disturbance.  Respiratory: Positive for shortness of breath. Negative for cough, chest tightness and wheezing.   Cardiovascular: Negative for chest pain.  Gastrointestinal: Negative for abdominal distention, abdominal pain, diarrhea, nausea and  vomiting.  Endocrine: Negative for polydipsia, polyphagia and polyuria.  Genitourinary: Negative for dysuria, frequency and hematuria.  Musculoskeletal: Negative for gait problem.  Skin: Negative for color change, pallor and rash.  Neurological: Negative for dizziness, syncope, light-headedness and headaches.  Hematological: Does not bruise/bleed easily.  Psychiatric/Behavioral: Negative for behavioral problems and confusion.     Physical Exam Updated Vital Signs BP (!) 159/51   Pulse 85   Temp 99.6 F (37.6 C) (Oral)   Resp (!) 26   Ht 5' 7.5" (1.715 m)   Wt 88.5 kg (195 lb)   SpO2 94%   BMI 30.09 kg/m   Physical Exam  Constitutional: He is oriented to person, place, and time. He appears well-developed and well-nourished. No distress.  HENT:  Head: Normocephalic.  Eyes: Conjunctivae are normal. Pupils are equal, round, and reactive to light. No scleral icterus.  Neck: Normal  range of motion. Neck supple. No thyromegaly present.  Cardiovascular: Normal rate and regular rhythm.  Exam reveals no gallop and no friction rub.   No murmur heard. Regular. S3 gallop noted.  Pulmonary/Chest: Effort normal and breath sounds normal. No respiratory distress. He has no wheezes. He has no rales.  Globally diminished breath sounds. Diffuse scattered rhonchi. Tachypnea with increased work of breathing, and forward dyspnea.  Abdominal: Soft. Bowel sounds are normal. He exhibits no distension. There is no tenderness. There is no rebound.  Musculoskeletal: Normal range of motion.  Neurological: He is alert and oriented to person, place, and time.  Skin: Skin is warm and dry. No rash noted.  Trace to 1+ symmetric lower cavity edema.  Psychiatric: He has a normal mood and affect. His behavior is normal.     ED Treatments / Results  Labs (all labs ordered are listed, but only abnormal results are displayed) Labs Reviewed  CBC WITH DIFFERENTIAL/PLATELET - Abnormal; Notable for the following:        Result Value   WBC 11.3 (*)    RBC 4.07 (*)    Hemoglobin 12.2 (*)    Neutro Abs 8.3 (*)    Monocytes Absolute 1.7 (*)    All other components within normal limits  COMPREHENSIVE METABOLIC PANEL - Abnormal; Notable for the following:    Sodium 133 (*)    Chloride 100 (*)    Glucose, Bld 189 (*)    BUN 31 (*)    Creatinine, Ser 1.76 (*)    Albumin 3.4 (*)    ALT 14 (*)    GFR calc non Af Amer 33 (*)    GFR calc Af Amer 39 (*)    All other components within normal limits  BRAIN NATRIURETIC PEPTIDE - Abnormal; Notable for the following:    B Natriuretic Peptide 360.9 (*)    All other components within normal limits  I-STAT TROPOININ, ED - Abnormal; Notable for the following:    Troponin i, poc 0.34 (*)    All other components within normal limits  D-DIMER, QUANTITATIVE (NOT AT [facility])  TROPONIN I  URINALYSIS, ROUTINE W REFLEX MICROSCOPIC    EKG  EKG Interpretation  Date/Time:  Saturday October 14 2016 15:07:11 EDT Ventricular Rate:  89 PR Interval:  236 QRS Duration: 154 QT Interval:  374 QTC Calculation: 455 R Axis:   -74 Text Interpretation:  Sinus rhythm with 1st degree A-V block Left axis deviation Right bundle branch block Abnormal ECG Otherwise no significant change Confirmed by Drema Pryardama, Pedro [phone]([id number]) on 10/14/2016 3:21:12 PM Also confirmed by Rolland PorterJames, Rhyan Radler ([id number])  on 10/14/2016 3:59:25 PM       Radiology Dg Chest 2 View  Result Date: 10/14/2016 CLINICAL DATA:  Shortness of Breath with worsening over the last 2 days. EXAM: CHEST  2 VIEW COMPARISON:  08/29/2016 FINDINGS: Lungs are hyperexpanded. Interstitial markings are diffusely coarsened with chronic features. Nodular right suprahilar density stable since 10/27/2015 and no nodule seen at this location on CT scan of 12/10/2015. No focal airspace consolidation. No pulmonary edema or pleural effusion. The cardio pericardial silhouette is enlarged. Patient is status post CABG. The visualized bony structures of the thorax  are intact. Telemetry leads overlie the chest. IMPRESSION: Cardiomegaly with emphysema.  No acute cardiopulmonary process. Electronically Signed   By: Kennith CenterEric  Mansell M.D.   On: 10/14/2016 17:03    Procedures Procedures (including critical care time)  Medications Ordered in ED Medications  albuterol (PROVENTIL) (2.5 MG/3ML) 0.083% nebulizer  solution 2.5 mg (not administered)  albuterol (PROVENTIL) (2.5 MG/3ML) 0.083% nebulizer solution 5 mg (5 mg Nebulization Given 10/14/16 1704)  furosemide (LASIX) injection 80 mg (80 mg Intravenous Given 10/14/16 1705)  methylPREDNISolone sodium succinate (SOLU-MEDROL) 125 mg/2 mL injection 125 mg (125 mg Intravenous Given 10/14/16 1844)     Initial Impression / Assessment and Plan / ED Course  I have reviewed the triage vital signs and the nursing notes.  Pertinent labs & imaging results that were available during my care of the patient were reviewed by me and considered in my medical decision making (see chart for details).   differential diagnosis includes COPD exacerbation, CHF, EKG shows no acute changes but troponin elevated consistent with N STEMI. This may be demand ischemia. However is not tachycardic or markedly hypertensive. Awaiting x-ray and renal function. We'll give IV Lasix. Reevaluation after above studies, and Lasix as above  Final Clinical Impressions(s) / ED Diagnoses   Final diagnoses:  Dyspnea, unspecified type    Patient has elevation of troponin. No EKG changes. He is not tachycardic. Possibility would include full room [place] as he was admitted and an inpatient 6 weeks ago. D-dimer is requested and pending. I discussed the case with Dr. Florene Glen. She will see the patient and planning admission.  New Prescriptions New Prescriptions   No medications on file     Rolland Porter, MD 10/14/16 2019

## 2016-10-14 NOTE — ED Notes (Signed)
Family contact Elijah Birkom (son) [phone]

## 2016-10-14 NOTE — ED Notes (Signed)
CBG 51 Reported same to RN,  And MD

## 2016-10-14 NOTE — Consult Note (Signed)
CARDIOLOGY INPATIENT CONSULTATION NOTE  Patient ID: Timothy Cordova MRN: [id number], DOB/AGE: [birth year]   Admit date: 10/14/2016   Primary Physician: Hannah Beat, MD Primary Cardiologist: Lorine Bears MD  Reason for Consult:   Elevated troponin  Requesting Physician: Therisa Doyne MD  HPI: This is a 83 y.o. male with known history of CAD s/p CABG (LIMA to LAD, SVG to Om3), HTN, HLD, OSA, DM2, solitary pulmonary nodule, diastolic heart failure and centrilobar emphysema presented with c/o dyspnea x 4 d.  Pt had progressively worsening dyspnea, which started when his lasix was recently decreased owing to his worsening Cr. He has poor appetite. No LE edema but continues to have cough with yellowish productive sputum. At home pulse ox was 85% on room air. He appears to have symptoms of COPD exacerbation. On arrival to ED, pt has low grade fevers (99.55F), tachypnea, but stable pulse ox in 90s. The initial troponin was elevated to 0.39.  Patient never had any chest pain.    CARDIOGRAPHICS #ECG 10/15/16 Normal sinus rhythm with RBBB, no PVCs present   #Echo 08/2016 Preserved LVEF 55-60%, Normal LV systolic function; mild diastolic dysfunction; mild LVH; mild LAE; trace MR and TR.  #Cath 04/29/2013 by Dr. Lorine Bears 0% LIMA->LAD, 20% rSVG to OM3 100% mLCx, 90% pLAD, 100% mRCA  Problem List: Past Medical History:  Diagnosis Date  . Allergic rhinitis   . Benign prostatic hypertrophy   . Chronic diastolic heart failure (HCC)    8/08 ECHO with EF 55%  . CKD (chronic kidney disease)   . COPD (chronic obstructive pulmonary disease) (HCC)    moderate. followed by Dr.Byrum  . Coronary artery disease    s/p CABG 2006. LHC (1/10): SVG-OM and PLOM, SVG-D, and LIMA-LAD were all patent  . Dementia    (mild)--06/2006  . Diabetes mellitus   . Edema    localized. suspect diastolic CHF + venous insufficiency  . Hearing loss    left >>right  . History of tobacco abuse   .  Hypercholesterolemia   . Hypertension   . Mixed hyperlipidemia   . Osteoarthritis   . Sleep apnea    ?    Past Surgical History:  Procedure Laterality Date  . ANGIOPLASTY  [id number]  . CARDIAC CATHETERIZATION  90s   [place] x1stent  . CARDIAC CATHETERIZATION     [facility];x2 stents  . CARDIAC CATHETERIZATION  04/29/2013  . CORONARY ARTERY BYPASS GRAFT  02/2005  . ROTATOR CUFF REPAIR  2005   left, [place]     Allergies:  Allergies  Allergen Reactions  . Metformin And Related Nausea And Vomiting  . Tramadol Hcl Other (See Comments)    Tremor vs seizure activity  . Ace Inhibitors Cough  . Atorvastatin Other (See Comments)    Makes the patient "weak"  . Codeine Nausea And Vomiting  . Gabapentin Other (See Comments)    "Messed up my nervous system"  . Morphine Sulfate Nausea And Vomiting  . Ticlopidine Hcl     Reaction unknown by patient or family  . Warfarin Sodium Nausea And Vomiting     Home Medications Current Facility-Administered Medications  Medication Dose Route Frequency Provider Last Rate Last Dose  . albuterol (PROVENTIL) (2.5 MG/3ML) 0.083% nebulizer solution 2.5 mg  2.5 mg Nebulization Q1H PRN Doutova, Anastassia, MD      . doxycycline (VIBRA-TABS) tablet 100 mg  100 mg Oral Q12H Doutova, Anastassia, MD   100 mg at 10/14/16 2252  . ipratropium-albuterol (DUONEB) 0.5-2.5 (  3) MG/3ML nebulizer solution 3 mL  3 mL Nebulization Q6H Doutova, Anastassia, MD   3 mL at 10/15/16 0148  . ipratropium-albuterol (DUONEB) 0.5-2.5 (3) MG/3ML nebulizer solution 3 mL  3 mL Nebulization QID Doutova, Anastassia, MD      . methylPREDNISolone sodium succinate (SOLU-MEDROL) 125 mg/2 mL injection 60 mg  60 mg Intravenous Q12H Doutova, Anastassia, MD   60 mg at 10/14/16 2252   Followed by  . predniSONE (DELTASONE) tablet 40 mg  40 mg Oral Q supper Therisa Doyne, MD         Family History  Problem Relation Age of Onset  . Heart attack Mother   . Lung cancer Sister       Social History   Social History  . Marital status: Married    Spouse name: N/A  . Number of children: N/A  . Years of education: N/A   Occupational History  . rubber fabrication Retired    RETIRED   Social History Main Topics  . Smoking status: Former Smoker    Packs/day: 3.00    Years: 60.00    Types: Cigarettes    Quit date: 07/12/2005  . Smokeless tobacco: Never Used  . Alcohol use No  . Drug use: No  . Sexual activity: Not on file   Other Topics Concern  . Not on file   Social History Narrative  . No narrative on file     Review of Systems: General: fatigue chills Cardiovascular: dyspnea, orthonpea Dermatological: negative for rash Respiratory: yellowish productive cough and wheezing  Urologic: negative for hematuria Abdominal: negative for nausea, vomiting, diarrhea, bright red blood per rectum, melena, or hematemesis Neurologic: negative for visual changes, syncope, or dizziness Hematology: mild anemia Psychiatry: non suicidal/homicidal  Musculoskeletal: back pain   Physical Exam: Vitals: BP (!) 186/60   Pulse (!) 45   Temp 98.8 F (37.1 C) (Oral)   Resp (!) 35   Ht 5' 7.5" (1.715 m)   Wt 81.1 kg (178 lb 12.8 oz)   SpO2 96%   BMI 27.59 kg/m  General: not in acute distress Neck: JVP flat, neck supple Heart: regular rate and rhythm, S1, S2, no murmurs  Lungs: CTAB  GI: non tender, non distended, bowel sounds present Extremities: no edema Neuro: AAO x 3  Psych: normal affect, no anxiety   Labs:   Results for orders placed or performed during the hospital encounter of 10/14/16 (from the past 24 hour(s))  CBC with Differential     Status: Abnormal   Collection Time: 10/14/16  3:10 PM  Result Value Ref Range   WBC 11.3 (H) 4.0 - 10.5 K/uL   RBC 4.07 (L) 4.22 - 5.81 MIL/uL   Hemoglobin 12.2 (L) 13.0 - 17.0 g/dL   HCT 16.1 09.6 - 04.5 %   MCV 96.3 78.0 - 100.0 fL   MCH 30.0 26.0 - 34.0 pg   MCHC 31.1 30.0 - 36.0 g/dL   RDW 40.9 81.1 -  91.4 %   Platelets 166 150 - 400 K/uL   Neutrophils Relative % 73 %   Neutro Abs 8.3 (H) 1.7 - 7.7 K/uL   Lymphocytes Relative 12 %   Lymphs Abs 1.3 0.7 - 4.0 K/uL   Monocytes Relative 15 %   Monocytes Absolute 1.7 (H) 0.1 - 1.0 K/uL   Eosinophils Relative 0 %   Eosinophils Absolute 0.0 0.0 - 0.7 K/uL   Basophils Relative 0 %   Basophils Absolute 0.0 0.0 - 0.1 K/uL  Comprehensive  metabolic panel     Status: Abnormal   Collection Time: 10/14/16  3:10 PM  Result Value Ref Range   Sodium 133 (L) 135 - 145 mmol/L   Potassium 4.9 3.5 - 5.1 mmol/L   Chloride 100 (L) 101 - 111 mmol/L   CO2 22 22 - 32 mmol/L   Glucose, Bld 189 (H) 65 - 99 mg/dL   BUN 31 (H) 6 - 20 mg/dL   Creatinine, Ser 1.61 (H) 0.61 - 1.24 mg/dL   Calcium 9.0 8.9 - 09.6 mg/dL   Total Protein 6.7 6.5 - 8.1 g/dL   Albumin 3.4 (L) 3.5 - 5.0 g/dL   AST 20 15 - 41 U/L   ALT 14 (L) 17 - 63 U/L   Alkaline Phosphatase 72 38 - 126 U/L   Total Bilirubin 0.9 0.3 - 1.2 mg/dL   GFR calc non Af Amer 33 (L) >60 mL/min   GFR calc Af Amer 39 (L) >60 mL/min   Anion gap 11 5 - 15  Brain natriuretic peptide     Status: Abnormal   Collection Time: 10/14/16  3:10 PM  Result Value Ref Range   B Natriuretic Peptide 360.9 (H) 0.0 - 100.0 pg/mL  I-Stat Troponin, ED (not at [facility])     Status: Abnormal   Collection Time: 10/14/16  3:27 PM  Result Value Ref Range   Troponin i, poc 0.34 (HH) 0.00 - 0.08 ng/mL   Comment NOTIFIED PHYSICIAN    Comment 3          D-dimer, quantitative (not at [facility])     Status: Abnormal   Collection Time: 10/14/16  8:13 PM  Result Value Ref Range   D-Dimer, Quant 1.42 (H) 0.00 - 0.50 ug/mL-FEU  Troponin I     Status: Abnormal   Collection Time: 10/14/16  8:13 PM  Result Value Ref Range   Troponin I 0.39 (HH) <0.03 ng/mL  I-Stat arterial blood gas, ED     Status: Abnormal   Collection Time: 10/14/16  9:28 PM  Result Value Ref Range   pH, Arterial 7.425 7.350 - 7.450   pCO2 arterial 41.3 32.0 - 48.0 mmHg    pO2, Arterial 68.0 (L) 83.0 - 108.0 mmHg   Bicarbonate 27.1 20.0 - 28.0 mmol/L   TCO2 28 0 - 100 mmol/L   O2 Saturation 94.0 %   Acid-Base Excess 2.0 0.0 - 2.0 mmol/L   Patient temperature 98.7 F    Collection site RADIAL, ALLEN'S TEST ACCEPTABLE    Drawn by Operator    Sample type ARTERIAL      Radiology/Studies: Dg Chest 2 View  Result Date: 10/14/2016 CLINICAL DATA:  Shortness of Breath with worsening over the last 2 days. EXAM: CHEST  2 VIEW COMPARISON:  08/29/2016 FINDINGS: Lungs are hyperexpanded. Interstitial markings are diffusely coarsened with chronic features. Nodular right suprahilar density stable since 10/27/2015 and no nodule seen at this location on CT scan of 12/10/2015. No focal airspace consolidation. No pulmonary edema or pleural effusion. The cardio pericardial silhouette is enlarged. Patient is status post CABG. The visualized bony structures of the thorax are intact. Telemetry leads overlie the chest. IMPRESSION: Cardiomegaly with emphysema.  No acute cardiopulmonary process. Electronically Signed   By: Kennith Center M.D.   On: 10/14/2016 17:03    #ECG 10/14/16 Normal sinus rhythm with RBBB, no PVCs present   #Echo 08/2016 Preserved LVEF 55-60%, Normal LV systolic function; mild diastolic dysfunction; mild LVH; mild LAE; trace MR and TR.  #Cath  04/29/2013 by Dr. Lorine BearsMuhammad Arida 0% LIMA->LAD, 20% rSVG to OM3 100% mLCx, 90% pLAD, 100% mRCA   Medical decision making:  Discussed care with the patient Discussed care with the physician on the phone Reviewed labs and imaging personally Reviewed prior records  ASSESSMENT AND PLAN:  This is a 83 y.o. male with known CAD s/p CABG, CKD stage 3, DM2, HTN, HLD, OSA COPD presented with symptoms of COPD exacerbation and is found to have elevated troponin.     Active Problems:   DM (diabetes mellitus), type 2 with renal complications (HCC)   Mixed hyperlipidemia   Essential hypertension   CAD, AUTOLOGOUS BYPASS GRAFT    DIASTOLIC HEART FAILURE, CHRONIC   Centrilobular emphysema (HCC)   CKD (chronic kidney disease) stage 3, GFR 30-59 ml/min   COPD with acute exacerbation (HCC)   Dyspnea   Elevated troponin level   Acute respiratory failure (HCC)   COPD exacerbation (HCC)   Elevated troponin 2/2 COPD exacerbation in the setting of renal failure - cycle troponin, obtain echo in AM for any new wall motion abnormalities, continue aspirin and statin  Coronary atherosclerosis in native arteries without angina Aspirin, statin  Acute on chronic diastolic heart failure NYHA class III - cautious use of diuretics, consider 1 dose of IV lasix   Signed, Joellyn RuedQURESHI, Janneth Krasner T, MD MS 10/15/2016, 3:12 AM

## 2016-10-14 NOTE — ED Notes (Signed)
Consulting MD (Cardiology) at bedside

## 2016-10-14 NOTE — ED Notes (Signed)
Consulting MD Paged to report Critical Lab value

## 2016-10-14 NOTE — H&P (Signed)
Timothy Postinhomas O Reifsteck WUJ:[id number] DOB: Dec 10, 1928 DOA: 10/14/2016     PCP: Hannah Beatopland, [place], MD   Outpatient Specialists:[facility]  Patient coming from:   home Lives   With family    Chief Complaint:  dyspena  HPI: Timothy Cordova is a 83 y.o. male with medical history significant of COPD with central lobar emphysema not on oxygen,CK D, chronic diastolic CHF, CAD, DM 2  solitary pulmonary nodule,  Sleep apnea, HLD, BPH, HTN  Presented with worsening dyspnea at rest and exertion for the past 3-4 very days no chest pain associated with that patient was in the low 90s on arrival and was started on 3 L for comfort in ER of note his Lasix was decreased back from 40 mg to 20 secondary to worsening kidney function that was about 2 weeks ago. Her weight has been actually going down he has had poor appetite. He's been having abdominal distention and early satiety. No lower extremity edema he haven't had any fevers he continues to cough with yellow sputum. Reports at home pulse ox was reading 85 % on RA He have had increased work of breathing for the past 10 days.   Denies constipation no diarrhea. Since in ER he has been feeling somewhat better but still increased work of breathing.   May 2018 patient was admitted for prolonged cough initially he was diagnosed with COPD exacerbation treated [place] cycle and a prednisone symptoms progressed and he presented to emergency department he was found to have bilateral bases infiltrates was treated with  Rocephin and azithromycin discharged to home on Levaquin and Mucinex Regarding pertinent Chronic problems:  diastolic CHF repeat echogram in May 2018 showed  55-60% with grade 1 diastolic dysfunction blockers continued, his Lasix increased to 40 mg daily Regarding history of diabetes A1c was 7.8% in March 2018 indicates was complicated by steroid-induced hyperglycemia while hospitalized  IN ER:  Temp (24hrs), Avg:99.1 F (37.3 C), Min:98.5 F (36.9  C), Max:99.6 F (37.6 C)      on arrival  ED Triage Vitals [10/14/16 1511]  Enc Vitals Group     BP 117/61     Pulse Rate 84     Resp (!) 24     Temp 98.5 F (36.9 C)     Temp Source Oral     SpO2 95 %     Weight 195 lb (88.5 kg)     Height 5' 7.5" (1.715 m)     Head Circumference      Peak Flow      Pain Score      Pain Loc      Pain Edu?      Excl. in GC?    RR 28 7095% on 2 L HR 86 BP 155/48 Reporting elevated at 0.34 WBC 11.3 hemoglobin 12.2 which is baseline Sodium 133 glucose 189 BUN 31 creatinine 1.76 which is around baseline since June BNP 360.9 up from 299.5 at the time of discharge Chest x-ray cardiomegaly with emphysema but no cardiopulmonary infiltrates  Following Medications were ordered in ER: Medications  albuterol (PROVENTIL) (2.5 MG/3ML) 0.083% nebulizer solution 2.5 mg (not administered)  albuterol (PROVENTIL) (2.5 MG/3ML) 0.083% nebulizer solution 5 mg (5 mg Nebulization Given 10/14/16 1704)  furosemide (LASIX) injection 80 mg (80 mg Intravenous Given 10/14/16 1705)  methylPREDNISolone sodium succinate (SOLU-MEDROL) 125 mg/2 mL injection 125 mg (125 mg Intravenous Given 10/14/16 1844)     ER provider discussed case with: Cardiology  Hospitalist was called for  admission elevated troponin and dyspnea  Review of Systems:    Pertinent positives include:  fatigue, weight loss Bilateral lower extremity swelling mild, shortness of breath at rest. dyspnea on exertion,  productive cough, change in color of mucus.   Constitutional:  No weight loss, night sweats, Fevers, chills  HEENT:  No headaches, Difficulty swallowing,Tooth/dental problems,Sore throat,  No sneezing, itching, ear ache, nasal congestion, post nasal drip,  Cardio-vascular:  No chest pain, Orthopnea, PND, anasarca, dizziness, palpitations.no  GI:  No heartburn, indigestion, abdominal pain, nausea, vomiting, diarrhea, change in bowel habits, loss of appetite, melena, blood in stool,  hematemesis Resp:    No excess mucus,  No non-productive cough, No coughing up of blood.  No wheezing. Skin:  no rash or lesions. No jaundice GU:  no dysuria, change in color of urine, no urgency or frequency. No straining to urinate.  No flank pain.  Musculoskeletal:  No joint pain or no joint swelling. No decreased range of motion. No back pain.  Psych:  No change in mood or affect. No depression or anxiety. No memory loss.  Neuro: no localizing neurological complaints, no tingling, no weakness, no double vision, no gait abnormality, no slurred speech, no confusion  As per HPI otherwise 10 point review of systems negative.   Past Medical History: Past Medical History:  Diagnosis Date  . Allergic rhinitis   . Benign prostatic hypertrophy   . Chronic diastolic heart failure (HCC)    8/08 ECHO with EF 55%  . CKD (chronic kidney disease)   . COPD (chronic obstructive pulmonary disease) (HCC)    moderate. followed by Dr.Byrum  . Coronary artery disease    s/p CABG 2006. LHC (1/10): SVG-OM and PLOM, SVG-D, and LIMA-LAD were all patent  . Dementia    (mild)--06/2006  . Diabetes mellitus   . Edema    localized. suspect diastolic CHF + venous insufficiency  . Hearing loss    left >>right  . History of tobacco abuse   . Hypercholesterolemia   . Hypertension   . Mixed hyperlipidemia   . Osteoarthritis   . Sleep apnea    ?   Past Surgical History:  Procedure Laterality Date  . ANGIOPLASTY  [id number]  . CARDIAC CATHETERIZATION  90s   [place], [place] x1stent  . CARDIAC CATHETERIZATION     [facility];x2 stents  . CARDIAC CATHETERIZATION  04/29/2013  . CORONARY ARTERY BYPASS GRAFT  02/2005  . ROTATOR CUFF REPAIR  2005   left, [place]     Social History:  Ambulatory   Independently    reports that he quit smoking about 11 years ago. His smoking use included Cigarettes. He has a 180.00 pack-year smoking history. He has never used smokeless tobacco. He reports that he does not drink  alcohol or use drugs.  Allergies:   Allergies  Allergen Reactions  . Metformin And Related Nausea And Vomiting  . Tramadol Hcl Other (See Comments)    Tremor vs seizure activity  . Ace Inhibitors Cough  . Atorvastatin Other (See Comments)    Makes the patient "weak"  . Codeine Nausea And Vomiting  . Gabapentin Other (See Comments)    "Messed up my nervous system"  . Morphine Sulfate Nausea And Vomiting  . Ticlopidine Hcl     Reaction unknown by patient or family  . Warfarin Sodium Nausea And Vomiting       Family History:   Family History  Problem Relation Age of Onset  . Heart attack Mother   .  Lung cancer Sister     Medications: Prior to Admission medications   Medication Sig Start Date End Date Taking? Authorizing Provider  albuterol (PROVENTIL HFA;VENTOLIN HFA) 108 (90 Base) MCG/ACT inhaler Inhale 2 puffs into the lungs every 6 (six) hours as needed for wheezing or shortness of breath. 10/09/15  Yes Short, Thea Silversmith, MD  aspirin 81 MG tablet Take 81 mg by mouth daily.   Yes [provider]  atorvastatin (LIPITOR) 40 MG tablet Take 40 mg by mouth daily.   Yes [provider]  Carboxymethylcellulose Sod PF 0.25 % SOLN Place 1 drop into both eyes 4 (four) times daily.    Yes [provider]  diphenhydramine-acetaminophen (TYLENOL PM) 25-500 MG TABS tablet Take 1 tablet by mouth at bedtime as needed (for sleep).    Yes [provider]  doxazosin (CARDURA) 8 MG tablet Take 4 mg by mouth at bedtime.    Yes [provider]  furosemide (LASIX) 20 MG tablet Take 1 tablet (20 mg total) by mouth daily. 09/19/16  Yes Copland, Karleen Hampshire, MD  glipiZIDE (GLUCOTROL) 5 MG tablet Take 1 tablet (5 mg total) by mouth 2 (two) times daily before a meal. 07/19/16  Yes Copland, Karleen Hampshire, MD  metoprolol (TOPROL-XL) 200 MG 24 hr tablet Take 100 mg by mouth daily.   Yes [provider]  Multiple Vitamins-Minerals (CENTRUM SILVER 50+MEN) TABS Take 1  tablet by mouth daily.   Yes [provider]  Multiple Vitamins-Minerals (PRESERVISION AREDS PO) Take 1 tablet by mouth daily.    Yes [provider]  Tiotropium Bromide-Olodaterol (STIOLTO RESPIMAT) 2.5-2.5 MCG/ACT AERS Inhale 2 puffs into the lungs every morning.    Yes [provider]  ONE TOUCH ULTRA TEST test strip USE TO CHECK BLOOD SUGAR 2 TIMES DAILY AS DIRECTED 09/08/15   Copland, Karleen Hampshire, MD  [facility] DELICA LANCETS 33G MISC  02/13/13   [provider]    Physical Exam: Patient Vitals for the past 24 hrs:  BP Temp Temp src Pulse Resp SpO2 Height Weight  10/14/16 1915 (!) 155/48 - - 86 (!) 28 95 % - -  10/14/16 1830 (!) 145/59 - - 83 (!) 32 100 % - -  10/14/16 1645 137/78 - - 87 19 94 % - -  10/14/16 1619 - 99.6 F (37.6 C) Oral - - - - -  10/14/16 1615 (!) 162/57 - - 85 (!) 23 96 % - -  10/14/16 1515 - - - - - - 5' 7.5" (1.715 m) 88.5 kg (195 lb)  10/14/16 1512 - - - - - 95 % - -  10/14/16 1511 117/61 98.5 F (36.9 C) Oral 84 (!) 24 95 % 5' 7.5" (1.715 m) 88.5 kg (195 lb)    1. General: increased work of breathing 2. Psychological: Alert and  Oriented 3. Head/ENT:     Dry Mucous Membranes                          Head Non traumatic, neck supple                           Poor Dentition 4. SKIN:  decreased Skin turgor,  Skin clean Dry and intact no rash 5. Heart: Regular rate and rhythm no Murmur, Rub or gallop 6. Lungs: occasional wheezes or crackles   7. Abdomen: Soft,  non-tender,  distended 8. Lower extremities: no clubbing, cyanosis, or edema 9.  Neurologically Grossly intact, moving all 4 extremities equally   10. MSK: Normal range of motion   body mass index is 30.09 kg/m.  Labs on Admission:   Labs on Admission: I have personally reviewed following labs and imaging studies  CBC:  Recent Labs Lab 10/14/16 1510  WBC 11.3*  NEUTROABS 8.3*  HGB 12.2*  HCT 39.2  MCV 96.3  PLT 166   Basic Metabolic Panel:  Recent  Labs Lab 10/14/16 1510  NA 133*  K 4.9  CL 100*  CO2 22  GLUCOSE 189*  BUN 31*  CREATININE 1.76*  CALCIUM 9.0   GFR: Estimated Creatinine Clearance: 32.3 mL/min (A) (by C-G formula based on SCr of 1.76 mg/dL (H)). Liver Function Tests:  Recent Labs Lab 10/14/16 1510  AST 20  ALT 14*  ALKPHOS 72  BILITOT 0.9  PROT 6.7  ALBUMIN 3.4*   No results for input(s): LIPASE, AMYLASE in the last 168 hours. No results for input(s): AMMONIA in the last 168 hours. Coagulation Profile: No results for input(s): INR, PROTIME in the last 168 hours. Cardiac Enzymes: No results for input(s): CKTOTAL, CKMB, CKMBINDEX, TROPONINI in the last 168 hours. BNP (last 3 results) No results for input(s): PROBNP in the last 8760 hours. HbA1C: No results for input(s): HGBA1C in the last 72 hours. CBG: No results for input(s): GLUCAP in the last 168 hours. Lipid Profile: No results for input(s): CHOL, HDL, LDLCALC, TRIG, CHOLHDL, LDLDIRECT in the last 72 hours. Thyroid Function Tests: No results for input(s): TSH, T4TOTAL, FREET4, T3FREE, THYROIDAB in the last 72 hours. Anemia Panel: No results for input(s): VITAMINB12, FOLATE, FERRITIN, TIBC, IRON, RETICCTPCT in the last 72 hours. Urine analysis:  Sepsis Labs: @LABRCNTIP (procalcitonin:4,lacticidven:4) )No results found for this or any previous visit (from the past 240 hour(s)).     UA  ordered  Lab Results  Component Value Date   HGBA1C 7.6 (H) 06/26/2016    Estimated Creatinine Clearance: 32.3 mL/min (A) (by C-G formula based on SCr of 1.76 mg/dL (H)).  BNP (last 3 results) No results for input(s): PROBNP in the last 8760 hours.   ECG REPORT  Independently reviewed Rate: 89  Rhythm: Sinus rhythm with 1st degree A-V block RBBB ST&T Change: No acute ischemic changes    QTC 455  Filed Weights   10/14/16 1511 10/14/16 1515  Weight: 88.5 kg (195 lb) 88.5 kg (195 lb)     Cultures:    Component Value Date/Time   SDES URINE,  RANDOM 10/07/2015 0105   SPECREQUEST NONE 10/07/2015 0105   CULT MULTIPLE SPECIES PRESENT, SUGGEST RECOLLECTION (A) 10/07/2015 0105   REPTSTATUS 10/08/2015 FINAL 10/07/2015 0105     Radiological Exams on Admission: Dg Chest 2 View  Result Date: 10/14/2016 CLINICAL DATA:  Shortness of Breath with worsening over the last 2 days. EXAM: CHEST  2 VIEW COMPARISON:  08/29/2016 FINDINGS: Lungs are hyperexpanded. Interstitial markings are diffusely coarsened with chronic features. Nodular right suprahilar density stable since 10/27/2015 and no nodule seen at this location on CT scan of 12/10/2015. No focal airspace consolidation. No pulmonary edema or pleural effusion. The cardio pericardial silhouette is enlarged. Patient is status post CABG. The visualized bony structures of the thorax are intact. Telemetry leads overlie the chest. IMPRESSION: Cardiomegaly with emphysema.  No acute cardiopulmonary process. Electronically Signed   By: Kennith Center M.D.   On: 10/14/2016 17:03    Chart has been reviewed    Assessment/Plan  83 y.o. male with medical history significant of  CK D, chronic diastolic CHF, CAD, DM 2 COPD with central lobar emphysema, solitary pulmonary nodule,  Sleep apnea, HLD, BPH, HTN  Admitted for elevated troponin and dyspnea  Present on Admission: . Dyspnea - Most likely COPD exacerbation,  Given elevated troponin will obtain echo. Elevated d.dimer and decreased ambulation will obtain VQ scan given CKD and elevated Cr.  COPD exacerbation -  -  - Will initiate: Steroid taper  -  Antibiotics   Doxycycline, - Albuterol  PRN, - scheduled duoneb,     -  Mucinex.  Titrate O2 to saturation >90%. Follow patients respiratory status. -  PCCM consulted for e-link monitoring,     Currently mentating well no evidence of symptomatic hypercarbia   . CAD, AUTOLOGOUS BYPASS GRAFT stable no chest pain continue home medications, repeat echo . Centrilobular emphysema (HCC) showing signs of COPD  exacerbation we'll treat accordingly . CKD (chronic kidney disease) stage 3, GFR 30-59 ml/min- creatinine is around new baseline carefully monitor fluid status . DIASTOLIC HEART FAILURE, CHRONIC currently does not appear to be fluid overloaded if anything dryish side to hold lasix for tonight . DM (diabetes mellitus), type 2 with renal complications (HCC) order sliding scale . Essential hypertension - while being evaluated for possible PE hold Cardura restart home medications if able to tolerate . Mixed hyperlipidemia stable continue home medicines . Elevated troponin level. Appreciate cardiology consult obtain echogram continue to cycle  and also likely demand ischemia and no chest pain redness of breath most likely pulmonary etiology While waiting no VQ scan tree to therapeutic Lovenox  Other plan as per orders.  DVT prophylaxis:   Lovenox   theraputic  Code Status:  Ok to resuscitate but not Intubate DNI   as per patient   Family Communication:   Family not at  Bedside    Disposition Plan:     To home once workup is complete and patient is stable                        Would benefit from PT/OT eval prior to DC  ordered                                              Consults called: email cardiology  , spoke to [facility] e-link  Admission status:    inpatient      Level of care    Step down      I have spent a total of 76 min on this admission   extra time was spent to discuss case with consultants  Betul Brisky 10/14/2016, 10:03 PM    [facility]  Pager [phone]   after 2 AM please page floor coverage PA If 7AM-7PM, please contact the day team taking care of the patient  [email or web address]  Password [email or web address]

## 2016-10-14 NOTE — ED Notes (Signed)
Patient transported to X-ray 

## 2016-10-14 NOTE — ED Triage Notes (Signed)
Pt st's his shortness of breath has gotten worse for past 3-4 days.  Pt denies any chest pain.  Pt placed on 02 in triage at 3LPM

## 2016-10-15 ENCOUNTER — Inpatient Hospital Stay (HOSPITAL_COMMUNITY): Payer: Medicare PPO

## 2016-10-15 DIAGNOSIS — I10 Essential (primary) hypertension: Secondary | ICD-10-CM

## 2016-10-15 DIAGNOSIS — J441 Chronic obstructive pulmonary disease with (acute) exacerbation: Principal | ICD-10-CM

## 2016-10-15 DIAGNOSIS — N183 Chronic kidney disease, stage 3 (moderate): Secondary | ICD-10-CM

## 2016-10-15 DIAGNOSIS — R310 Gross hematuria: Secondary | ICD-10-CM

## 2016-10-15 DIAGNOSIS — E782 Mixed hyperlipidemia: Secondary | ICD-10-CM

## 2016-10-15 DIAGNOSIS — I5032 Chronic diastolic (congestive) heart failure: Secondary | ICD-10-CM

## 2016-10-15 DIAGNOSIS — R748 Abnormal levels of other serum enzymes: Secondary | ICD-10-CM

## 2016-10-15 LAB — COMPREHENSIVE METABOLIC PANEL
ALK PHOS: 67 U/L (ref 38–126)
ALT: 14 U/L — AB (ref 17–63)
ANION GAP: 10 (ref 5–15)
AST: 16 U/L (ref 15–41)
Albumin: 3 g/dL — ABNORMAL LOW (ref 3.5–5.0)
BILIRUBIN TOTAL: 0.7 mg/dL (ref 0.3–1.2)
BUN: 39 mg/dL — ABNORMAL HIGH (ref 6–20)
CALCIUM: 9 mg/dL (ref 8.9–10.3)
CO2: 25 mmol/L (ref 22–32)
CREATININE: 1.81 mg/dL — AB (ref 0.61–1.24)
Chloride: 97 mmol/L — ABNORMAL LOW (ref 101–111)
GFR, EST AFRICAN AMERICAN: 37 mL/min — AB (ref >60)
GFR, EST NON AFRICAN AMERICAN: 32 mL/min — AB (ref >60)
Glucose, Bld: 389 mg/dL — ABNORMAL HIGH (ref 65–99)
Potassium: 4.3 mmol/L (ref 3.5–5.1)
Sodium: 132 mmol/L — ABNORMAL LOW (ref 135–145)
TOTAL PROTEIN: 6.3 g/dL — AB (ref 6.5–8.1)

## 2016-10-15 LAB — URINALYSIS, ROUTINE W REFLEX MICROSCOPIC
Bacteria, UA: NONE SEEN
Bilirubin Urine: NEGATIVE
KETONES UR: NEGATIVE mg/dL
Leukocytes, UA: NEGATIVE
NITRITE: NEGATIVE
PROTEIN: NEGATIVE mg/dL
Specific Gravity, Urine: 1.007 (ref 1.005–1.030)
pH: 5 (ref 5.0–8.0)

## 2016-10-15 LAB — RESPIRATORY PANEL BY PCR
ADENOVIRUS-RVPPCR: NOT DETECTED
BORDETELLA PERTUSSIS-RVPCR: NOT DETECTED
CHLAMYDOPHILA PNEUMONIAE-RVPPCR: NOT DETECTED
CORONAVIRUS NL63-RVPPCR: NOT DETECTED
Coronavirus 229E: NOT DETECTED
Coronavirus HKU1: NOT DETECTED
Coronavirus OC43: NOT DETECTED
INFLUENZA A-RVPPCR: NOT DETECTED
Influenza B: NOT DETECTED
Metapneumovirus: NOT DETECTED
Mycoplasma pneumoniae: NOT DETECTED
PARAINFLUENZA VIRUS 3-RVPPCR: NOT DETECTED
PARAINFLUENZA VIRUS 4-RVPPCR: NOT DETECTED
Parainfluenza Virus 1: NOT DETECTED
Parainfluenza Virus 2: NOT DETECTED
RHINOVIRUS / ENTEROVIRUS - RVPPCR: NOT DETECTED
Respiratory Syncytial Virus: NOT DETECTED

## 2016-10-15 LAB — GLUCOSE, CAPILLARY
GLUCOSE-CAPILLARY: 361 mg/dL — AB (ref 65–99)
GLUCOSE-CAPILLARY: 302 mg/dL — AB (ref 65–99)
GLUCOSE-CAPILLARY: 289 mg/dL — AB (ref 65–99)
Glucose-Capillary: 240 mg/dL — ABNORMAL HIGH (ref 65–99)
Glucose-Capillary: 249 mg/dL — ABNORMAL HIGH (ref 65–99)
Glucose-Capillary: 356 mg/dL — ABNORMAL HIGH (ref 65–99)

## 2016-10-15 LAB — CBC
HCT: 35.4 % — ABNORMAL LOW (ref 39.0–52.0)
HEMOGLOBIN: 10.9 g/dL — AB (ref 13.0–17.0)
MCH: 29.7 pg (ref 26.0–34.0)
MCHC: 30.8 g/dL (ref 30.0–36.0)
MCV: 96.5 fL (ref 78.0–100.0)
PLATELETS: 137 10*3/uL — AB (ref 150–400)
RBC: 3.67 MIL/uL — AB (ref 4.22–5.81)
RDW: 13.7 % (ref 11.5–15.5)
WBC: 5 10*3/uL (ref 4.0–10.5)

## 2016-10-15 LAB — HEMOGLOBIN AND HEMATOCRIT, BLOOD
HEMATOCRIT: 36.9 % — AB (ref 39.0–52.0)
HEMOGLOBIN: 11.9 g/dL — AB (ref 13.0–17.0)

## 2016-10-15 LAB — MAGNESIUM: MAGNESIUM: 1.8 mg/dL (ref 1.7–2.4)

## 2016-10-15 LAB — TSH: TSH: 0.367 u[IU]/mL (ref 0.350–4.500)

## 2016-10-15 LAB — MRSA PCR SCREENING: MRSA BY PCR: NEGATIVE

## 2016-10-15 LAB — TROPONIN I
TROPONIN I: 0.17 ng/mL — AB (ref <0.03)
Troponin I: 0.18 ng/mL (ref <0.03)
Troponin I: 0.12 ng/mL (ref <0.03)

## 2016-10-15 LAB — PHOSPHORUS: Phosphorus: 4.4 mg/dL (ref 2.5–4.6)

## 2016-10-15 MED ORDER — GUAIFENESIN ER 600 MG PO TB12
600.0000 mg | ORAL_TABLET | Freq: Two times a day (BID) | ORAL | Status: DC
  Administered 2016-10-15 – 2016-10-17 (×6): 600 mg via ORAL
  Filled 2016-10-15 (×6): qty 1

## 2016-10-15 MED ORDER — ACETAMINOPHEN 325 MG PO TABS: 650.0000 mg | ORAL_TABLET | Freq: Four times a day (QID) | ORAL | Status: DC | PRN

## 2016-10-15 MED ORDER — ATORVASTATIN CALCIUM 40 MG PO TABS
40.0000 mg | ORAL_TABLET | Freq: Every day | ORAL | Status: DC
  Administered 2016-10-15 – 2016-10-17 (×3): 40 mg via ORAL
  Filled 2016-10-15 (×3): qty 1

## 2016-10-15 MED ORDER — ONDANSETRON HCL 4 MG PO TABS: 4.0000 mg | ORAL_TABLET | Freq: Four times a day (QID) | ORAL | Status: DC | PRN

## 2016-10-15 MED ORDER — MOMETASONE FURO-FORMOTEROL FUM 100-5 MCG/ACT IN AERO
2.0000 | INHALATION_SPRAY | Freq: Two times a day (BID) | RESPIRATORY_TRACT | Status: DC
  Administered 2016-10-15 – 2016-10-17 (×4): 2 via RESPIRATORY_TRACT
  Filled 2016-10-15: qty 8.8

## 2016-10-15 MED ORDER — HYDROCODONE-ACETAMINOPHEN 5-325 MG PO TABS: 1.0000 | ORAL_TABLET | ORAL | Status: DC | PRN

## 2016-10-15 MED ORDER — ACETAMINOPHEN 650 MG RE SUPP: 650.0000 mg | Freq: Four times a day (QID) | RECTAL | Status: DC | PRN

## 2016-10-15 MED ORDER — METHYLPREDNISOLONE SODIUM SUCC 125 MG IJ SOLR
60.0000 mg | Freq: Three times a day (TID) | INTRAMUSCULAR | Status: DC
  Administered 2016-10-15 – 2016-10-16 (×3): 60 mg via INTRAVENOUS
  Filled 2016-10-15 (×3): qty 2

## 2016-10-15 MED ORDER — INSULIN ASPART 100 UNIT/ML ~~LOC~~ SOLN
0.0000 [IU] | SUBCUTANEOUS | Status: DC
  Administered 2016-10-15: 3 [IU] via SUBCUTANEOUS
  Administered 2016-10-15: 9 [IU] via SUBCUTANEOUS
  Administered 2016-10-15: 7 [IU] via SUBCUTANEOUS
  Administered 2016-10-15: 9 [IU] via SUBCUTANEOUS
  Administered 2016-10-15: 5 [IU] via SUBCUTANEOUS
  Administered 2016-10-16: 3 [IU] via SUBCUTANEOUS
  Administered 2016-10-16: 5 [IU] via SUBCUTANEOUS
  Administered 2016-10-16: 2 [IU] via SUBCUTANEOUS
  Administered 2016-10-16: 5 [IU] via SUBCUTANEOUS
  Administered 2016-10-16: 2 [IU] via SUBCUTANEOUS
  Administered 2016-10-16: 3 [IU] via SUBCUTANEOUS
  Administered 2016-10-17: 6 [IU] via SUBCUTANEOUS
  Administered 2016-10-17 (×2): 3 [IU] via SUBCUTANEOUS

## 2016-10-15 MED ORDER — METOPROLOL SUCCINATE ER 100 MG PO TB24
100.0000 mg | ORAL_TABLET | Freq: Every day | ORAL | Status: DC
  Administered 2016-10-15 – 2016-10-17 (×3): 100 mg via ORAL
  Filled 2016-10-15 (×3): qty 1

## 2016-10-15 MED ORDER — SODIUM CHLORIDE 0.9% FLUSH
3.0000 mL | Freq: Two times a day (BID) | INTRAVENOUS | Status: DC
  Administered 2016-10-15 – 2016-10-17 (×6): 3 mL via INTRAVENOUS

## 2016-10-15 MED ORDER — TAMSULOSIN HCL 0.4 MG PO CAPS
0.4000 mg | ORAL_CAPSULE | Freq: Every day | ORAL | Status: DC
  Administered 2016-10-15 – 2016-10-16 (×2): 0.4 mg via ORAL
  Filled 2016-10-15 (×2): qty 1

## 2016-10-15 MED ORDER — ONDANSETRON HCL 4 MG/2ML IJ SOLN: 4.0000 mg | Freq: Four times a day (QID) | INTRAMUSCULAR | Status: DC | PRN

## 2016-10-15 MED ORDER — ASPIRIN EC 81 MG PO TBEC
81.0000 mg | DELAYED_RELEASE_TABLET | Freq: Every day | ORAL | Status: DC
  Administered 2016-10-15: 81 mg via ORAL
  Filled 2016-10-15: qty 1

## 2016-10-15 MED ORDER — INSULIN GLARGINE 100 UNIT/ML ~~LOC~~ SOLN
10.0000 [IU] | Freq: Every day | SUBCUTANEOUS | Status: DC
  Administered 2016-10-15 – 2016-10-16 (×2): 10 [IU] via SUBCUTANEOUS
  Filled 2016-10-15 (×2): qty 0.1

## 2016-10-15 MED ORDER — INSULIN ASPART 100 UNIT/ML ~~LOC~~ SOLN
5.0000 [IU] | Freq: Three times a day (TID) | SUBCUTANEOUS | Status: DC
  Administered 2016-10-15 – 2016-10-17 (×6): 5 [IU] via SUBCUTANEOUS

## 2016-10-15 MED ORDER — ENOXAPARIN SODIUM 80 MG/0.8ML ~~LOC~~ SOLN
1.0000 mg/kg | Freq: Two times a day (BID) | SUBCUTANEOUS | Status: DC
  Administered 2016-10-15: 80 mg via SUBCUTANEOUS
  Filled 2016-10-15: qty 0.8

## 2016-10-15 NOTE — Progress Notes (Signed)
PT Cancellation Note  Patient Details Name: Timothy Cordova MRN: [id number] DOB: [birth year]   Cancelled Treatment:    Reason Eval/Treat Not Completed: Patient not medically ready   Noted V/Q scan ordered and pending.    Scherrie NovemberLynn P Molly Savarino, PT 10/15/2016, 4:13 PM

## 2016-10-15 NOTE — ED Notes (Signed)
Attempted to call report

## 2016-10-15 NOTE — Progress Notes (Signed)
Patient ID: Timothy Cordova, male   DOB: [birth year], 83 y.o.   MRN: [id number]  PROGRESS NOTE    Timothy Cordova  WUJ:[id number] DOB: 06-22-1928 DOA: 10/14/2016 PCP: Hannah Beat, MD   Brief Narrative:  83 year old male with history of COPD with centrilobular emphysema not on oxygen, chronic kidney disease, chronic diastolic CHF, coronary artery disease, diabetes mellitus type 2, solitary pulmonary nodule, sleep apnea, dyslipidemia, BPH, hypertension presented with worsening dyspnea and hypoxia. He was admitted for probable COPD exacerbation and positive troponins. Cardiology has evaluated the patient. He is complaining of hematuria this morning  Assessment & Plan:   Active Problems:   DM (diabetes mellitus), type 2 with renal complications (HCC)   Mixed hyperlipidemia   Essential hypertension   CAD, AUTOLOGOUS BYPASS GRAFT   DIASTOLIC HEART FAILURE, CHRONIC   Centrilobular emphysema (HCC)   CKD (chronic kidney disease) stage 3, GFR 30-59 ml/min   COPD with acute exacerbation (HCC)   Dyspnea   Elevated troponin level   Acute respiratory failure (HCC)   COPD exacerbation (HCC)  Hematuria - Patient is having frank hematuria. Hemoglobin this morning was 10.9. Stat H&H and every 12 hours. - Renal ultrasound showed bilateral renal cysts with no hydronephrosis. We will call urology for consult - Discontinue Lovenox and aspirin  Probable COPD exacerbation causing hypoxia - Continue Solu-Medrol 60 mg IV every 8 hours. Add Dulera. Continue DuoNeb and doxycycline for now. - Patient could not lie down flat for VQ scan - If respiratory status worsens, will get CAT scan of the chest - Continue oxygen supplementation  Positive troponins - Most likely secondary to demand ischemia. Follow troponins. Cardiology evaluation appreciated. Discontinue aspirin and Lovenox because of hematuria. continue metoprolol and statin. Follow-up Echo  Chronic diastolic heart failure - Currently  compensated. Hold Lasix for now. Follow cardiology recommendations  Chronic kidney disease stage III - Creatinine at baseline. Monitor creatinine and urine output  Diabetes mellitus type 2 - Sliding-scale insulin  Hypertension - Monitor blood pressure. Continue metoprolol for now  Coronary artery disease with history of bypass graft - Follow cardiology recommendations  Dyslipidemia - Continue statins  DVT prophylaxis: Discontinue Lovenox Code Status:  Okay to resuscitate but DO NOT INTUBATE Family Communication: None at bedside Disposition Plan: Home in 2-4 days if condition improves  Consultants: Cardiology, urology  Procedures: Echo pending  Antimicrobials: Doxycycline from 10/14/2016 Subjective: Patient seen and examined at bedside. He complains of hematuria. His shortness of breath is improving. No overnight fever, nausea or vomiting  Objective: Vitals:   10/15/16 0321 10/15/16 0338 10/15/16 0340 10/15/16 1000  BP: (!) 165/59   (!) 156/58  Pulse:    80  Resp: 18     Temp: 97.6 F (36.4 C)     TempSrc: Axillary     SpO2: 98% 91% 93%   Weight:      Height:        Intake/Output Summary (Last 24 hours) at 10/15/16 1126 Last data filed at 10/15/16 0640  Gross per 24 hour  Intake                0 ml  Output              645 ml  Net             -645 ml   Filed Weights   10/14/16 1511 10/14/16 1515 10/15/16 0308  Weight: 88.5 kg (195 lb) 88.5 kg (195 lb) 81.1 kg (178 lb 12.8 oz)  Examination:  General exam: Appears calm and comfortable  Respiratory system: Bilateral decreased breath sound at basesWith scattered crackles and wheezing Cardiovascular system: S1 & S2 heard, rate controlled  Gastrointestinal system: Abdomen is nondistended, soft and nontender. Normal bowel sounds heard. Central nervous system: Alert and oriented. No focal neurological deficits. Moving extremities Extremities: No cyanosis, clubbing, edema  Skin: No rashes, lesions or  ulcers Genitourinary: Minimal frank bleeding seen at the penile meatus    Data Reviewed: I have personally reviewed following labs and imaging studies  CBC:  Recent Labs Lab 10/14/16 1510 10/15/16 0428  WBC 11.3* 5.0  NEUTROABS 8.3*  --   HGB 12.2* 10.9*  HCT 39.2 35.4*  MCV 96.3 96.5  PLT 166 137*   Basic Metabolic Panel:  Recent Labs Lab 10/14/16 1510 10/15/16 0428  NA 133* 132*  K 4.9 4.3  CL 100* 97*  CO2 22 25  GLUCOSE 189* 389*  BUN 31* 39*  CREATININE 1.76* 1.81*  CALCIUM 9.0 9.0  MG  --  1.8  PHOS  --  4.4   GFR: Estimated Creatinine Clearance: 30.2 mL/min (A) (by C-G formula based on SCr of 1.81 mg/dL (H)). Liver Function Tests:  Recent Labs Lab 10/14/16 1510 10/15/16 0428  AST 20 16  ALT 14* 14*  ALKPHOS 72 67  BILITOT 0.9 0.7  PROT 6.7 6.3*  ALBUMIN 3.4* 3.0*   No results for input(s): LIPASE, AMYLASE in the last 168 hours. No results for input(s): AMMONIA in the last 168 hours. Coagulation Profile: No results for input(s): INR, PROTIME in the last 168 hours. Cardiac Enzymes:  Recent Labs Lab 10/14/16 2013 10/15/16 0428 10/15/16 0958  TROPONINI 0.39* 0.18* 0.12*   BNP (last 3 results) No results for input(s): PROBNP in the last 8760 hours. HbA1C: No results for input(s): HGBA1C in the last 72 hours. CBG:  Recent Labs Lab 10/15/16 0449 10/15/16 0814  GLUCAP 361* 302*   Lipid Profile: No results for input(s): CHOL, HDL, LDLCALC, TRIG, CHOLHDL, LDLDIRECT in the last 72 hours. Thyroid Function Tests:  Recent Labs  10/15/16 0428  TSH 0.367   Anemia Panel: No results for input(s): VITAMINB12, FOLATE, FERRITIN, TIBC, IRON, RETICCTPCT in the last 72 hours. Sepsis Labs: No results for input(s): PROCALCITON, LATICACIDVEN in the last 168 hours.  Recent Results (from the past 240 hour(s))  MRSA PCR Screening     Status: None   Collection Time: 10/15/16  3:23 AM  Result Value Ref Range Status   MRSA by PCR NEGATIVE NEGATIVE  Final    Comment:        The GeneXpert MRSA Assay (FDA approved for NASAL specimens only), is one component of a comprehensive MRSA colonization surveillance program. It is not intended to diagnose MRSA infection nor to guide or monitor treatment for MRSA infections.          Radiology Studies: Dg Chest 2 View  Result Date: 10/14/2016 CLINICAL DATA:  Shortness of Breath with worsening over the last 2 days. EXAM: CHEST  2 VIEW COMPARISON:  08/29/2016 FINDINGS: Lungs are hyperexpanded. Interstitial markings are diffusely coarsened with chronic features. Nodular right suprahilar density stable since 10/27/2015 and no nodule seen at this location on CT scan of 12/10/2015. No focal airspace consolidation. No pulmonary edema or pleural effusion. The cardio pericardial silhouette is enlarged. Patient is status post CABG. The visualized bony structures of the thorax are intact. Telemetry leads overlie the chest. IMPRESSION: Cardiomegaly with emphysema.  No acute cardiopulmonary process. Electronically Signed  By: Kennith Center M.D.   On: 10/14/2016 17:03   US Renal  Result Date: 10/15/2016 CLINICAL DATA:  Gross hematuria. EXAM: RENAL / URINARY TRACT ULTRASOUND COMPLETE COMPARISON:  MRI head 03/29/2012 FINDINGS: Right Kidney: Length: 11.0 cm. No hydronephrosis. Diffuse cortical thinning. Small hypoechoic 12 mm lesion identified in the lower pole region and a second 17 mm lesion is identified in the interpolar region. Left Kidney: Length: 12.2 cm. No hydronephrosis. Three cysts are identified, including a dominant 7 cm lesion towards the lower pole. Bladder: Appears normal for degree of bladder distention. IMPRESSION: 1. The patient is bilateral renal cysts which were also visible on the prior MRI although a particular lesion in the superficial aspect of the interpolar right kidney was not present on the prior MRI. This represents a 17 mm hypoechoic lesion on today's study. Given the history of  hematuria, further imaging workup is likely warranted and MRI without and with contrast would be the study of choice to further evaluate. Electronically Signed   By: Kennith Center M.D.   On: 10/15/2016 09:13        Scheduled Meds: . aspirin EC  81 mg Oral Daily  . atorvastatin  40 mg Oral Daily  . doxycycline  100 mg Oral Q12H  . guaiFENesin  600 mg Oral BID  . insulin aspart  0-9 Units Subcutaneous Q4H  . ipratropium-albuterol  3 mL Nebulization QID  . methylPREDNISolone (SOLU-MEDROL) injection  60 mg Intravenous Q8H  . metoprolol  100 mg Oral Daily  . mometasone-formoterol  2 puff Inhalation BID  . sodium chloride flush  3 mL Intravenous Q12H   Continuous Infusions:   LOS: 1 day        Glade Lloyd, MD [facility] Pager [phone]  If 7PM-7AM, please contact night-coverage [email or web address] Password [email or web address] 10/15/2016, 11:26 AM

## 2016-10-15 NOTE — Progress Notes (Signed)
Blood noted in patient's urine. Pt denies pain or difficulty starting a stream of urine. Urine color 2 hours ago was clear and yellow in color. Pt verbalized he had one time occurrence of blood in urine whiles in the ED. RN assessed external male genitalia and found no open areas or trauma. MD notified. Will continue to monitor

## 2016-10-15 NOTE — Consult Note (Addendum)
Urology Consult  Consulting MD: Geannie Risen, MD  CC: Blood in urine  HPI: This is a 83 year old male with multiple medical issues, currently being treated for COPD exacerbation and positive troponins.  He is normally on small dose of aspirin daily.  Since his presentation to the emergency room yesterday, he has been on Lovenox.  The patient had one episode of initial gross painless hematuria.  That was at 2 in the morning today.  Since then, he says that his urine has been fairly clear.  He has had no dysuria, change in urinary pattern, abdominal or flank pain.  Prior visit and our office was in 2013, when he saw Dr. Jerilee Field for an abnormal left renal cyst.  MRI without contrast was performed, revealing no significant abnormalities warranting treatment and an 83 year old male.  He decided to follow-up when necessary.  He does have somewhat bothersome lower urinary tract symptoms, frequency, especially after furosemide administration, urgency, intermittency, hesitancy and slow stream.  He is not on any medical therapy for this.   PMH: Past Medical History:  Diagnosis Date  . Allergic rhinitis   . Benign prostatic hypertrophy   . Chronic diastolic heart failure (HCC)    8/08 ECHO with EF 55%  . CKD (chronic kidney disease)   . COPD (chronic obstructive pulmonary disease) (HCC)    moderate. followed by Dr.Byrum  . Coronary artery disease    s/p CABG 2006. LHC (1/10): SVG-OM and PLOM, SVG-D, and LIMA-LAD were all patent  . Dementia    (mild)--06/2006  . Diabetes mellitus   . Edema    localized. suspect diastolic CHF + venous insufficiency  . Hearing loss    left >>right  . History of tobacco abuse   . Hypercholesterolemia   . Hypertension   . Mixed hyperlipidemia   . Osteoarthritis   . Sleep apnea    ?    PSH: Past Surgical History:  Procedure Laterality Date  . ANGIOPLASTY  [id number]  . CARDIAC CATHETERIZATION  90s   [place], [place] x1stent  . CARDIAC CATHETERIZATION     [facility];x2 stents  . CARDIAC CATHETERIZATION  04/29/2013  . CORONARY ARTERY BYPASS GRAFT  02/2005  . ROTATOR CUFF REPAIR  2005   left, [place]    Allergies: Allergies  Allergen Reactions  . Metformin And Related Nausea And Vomiting  . Tramadol Hcl Other (See Comments)    Tremor vs seizure activity  . Ace Inhibitors Cough  . Atorvastatin Other (See Comments)    Makes the patient "weak"  . Codeine Nausea And Vomiting  . Gabapentin Other (See Comments)    "Messed up my nervous system"  . Morphine Sulfate Nausea And Vomiting  . Ticlopidine Hcl     Reaction unknown by patient or family  . Warfarin Sodium Nausea And Vomiting    Medications: Prescriptions Prior to Admission  Medication Sig Dispense Refill Last Dose  . albuterol (PROVENTIL HFA;VENTOLIN HFA) 108 (90 Base) MCG/ACT inhaler Inhale 2 puffs into the lungs every 6 (six) hours as needed for wheezing or shortness of breath. 2 Inhaler 0 10/14/2016 at 1200  . aspirin 81 MG tablet Take 81 mg by mouth daily.   10/14/2016 at 1100  . atorvastatin (LIPITOR) 40 MG tablet Take 40 mg by mouth daily.   10/11/2016 at pm  . Carboxymethylcellulose Sod PF 0.25 % SOLN Place 1 drop into both eyes 4 (four) times daily.    10/14/2016 at 1200  . diphenhydramine-acetaminophen (TYLENOL PM) 25-500 MG TABS  tablet Take 1 tablet by mouth at bedtime as needed (for sleep).    Past Week at Unknown time  . doxazosin (CARDURA) 8 MG tablet Take 4 mg by mouth at bedtime.    10/11/2016 at pm  . furosemide (LASIX) 20 MG tablet Take 1 tablet (20 mg total) by mouth daily. 30 tablet 0 10/14/2016 at am  . glipiZIDE (GLUCOTROL) 5 MG tablet Take 1 tablet (5 mg total) by mouth 2 (two) times daily before a meal. 180 tablet 3 10/14/2016 at am  . metoprolol (TOPROL-XL) 200 MG 24 hr tablet Take 100 mg by mouth daily.   10/14/2016 at 1100  . Multiple Vitamins-Minerals (CENTRUM SILVER 50+MEN) TABS Take 1 tablet by mouth daily.   10/14/2016 at am  . Multiple Vitamins-Minerals (PRESERVISION AREDS PO)  Take 1 tablet by mouth daily.    10/14/2016 at am  . Tiotropium Bromide-Olodaterol (STIOLTO RESPIMAT) 2.5-2.5 MCG/ACT AERS Inhale 2 puffs into the lungs every morning.    10/14/2016 at am  . ONE TOUCH ULTRA TEST test strip USE TO CHECK BLOOD SUGAR 2 TIMES DAILY AS DIRECTED 50 each 11 UNK at [facility]  . ONETOUCH DELICA LANCETS 33G MISC    UNK at [employer]     Social History: Social History   Social History  . Marital status: Married    Spouse name: N/A  . Number of children: N/A  . Years of education: N/A   Occupational History  . rubber fabrication Retired    RETIRED   Social History Main Topics  . Smoking status: Former Smoker    Packs/day: 3.00    Years: 60.00    Types: Cigarettes    Quit date: 07/12/2005  . Smokeless tobacco: Never Used  . Alcohol use No  . Drug use: No  . Sexual activity: Not on file   Other Topics Concern  . Not on file   Social History Narrative  . No narrative on file    Family History: Family History  Problem Relation Age of Onset  . Heart attack Mother   . Lung cancer Sister     Review of Systems: Positive: Gross hematuria, shortness of breath, lower urinary tract symptoms, Nocturia Negative:   A further 10 point review of systems was negative except what is listed in the HPI.  Physical Exam: @VITALS2 @ General: No acute distress.  Awake. Head:  Normocephalic.  Atraumatic. ENT:  EOMI.  Mucous membranes moist Neck:  Supple.  No lymphadenopathy. CV:  S1 present. S2 present. Regular rate. Pulmonary: Equal effort bilaterally.  Clear to auscultation bilaterally. Abdomen: Soft.  Obese, non- tender to palpation. Skin:  Normal turgor.  No visible rash. Extremity: No gross deformity of bilateral upper extremities.  No gross deformity of bilateral lower extremities. Neurologic: Alert. Appropriate mood.  Penis:  Uncircumcised.  No lesions. Urethra:           Orthotopic meatus with a small about of blood present, dried, at the meatus Scrotum: No lesions.   No ecchymosis.  No erythema. Testicles: Descended bilaterally.  No masses bilaterally.  Atrophic Epididymis: Palpable bilaterally.  Non Tender to palpation. Rectal:  Normal anal sphincter tone.  Gland 40 grams in size, right lobe slightly larger than left, but nonsuspicious in nature, without nodularity.  Studies:  Recent Labs     10/14/16  1510  10/15/16  0428  10/15/16  1208  HGB  12.2*  10.9*  11.9*  WBC  11.3*  5.0   --   PLT  166  137*   --     Recent Labs     10/14/16  1510  10/15/16  0428  NA  133*  132*  K  4.9  4.3  CL  100*  97*  CO2  22  25  BUN  31*  39*  CREATININE  1.76*  1.81*  CALCIUM  9.0  9.0  GFRNONAA  33*  32*  GFRAA  39*  37*     No results for input(s): INR, APTT in the last 72 hours.  Invalid input(s): PT   Invalid input(s): ABG  Renal ultrasound reveals 2 cysts on the right, 3 on the left with a relatively new 17 millimeter cyst not seen on prior MRI from 2013.  There is no hydronephrosis, no solid renal lesions.   Assessment:    1.  Initial gross painless hematuria, most likely related to prostatic bleeding rather than upper tract or bladder.  This is typically benign in nature.  It has resolved somewhat, although at this point, with the patient on both aspirin and Lovenox, this may be an intermittent issue.  2.  BPH, somewhat bothersome.  Patient would like to see if medical therapy would help  Plan:  1.  I reassured him about the bleeding.  This is most likely prostatic in nature, and at this point, I don't think any other management is necessary.  I don't suspect urinary tract infection with his urinalysis from the time of the emergency room visit.  2.  If the bleeding does resume, place 20 [place] Foley catheter.  This may well tamponadec the urethral bleeding, which is more than likely the issue here.  3.  As the patient does have some bothersome lower urinary tract symptomatology, I have started him on tamsulosin.  I let him know that  this can cause some orthostasis.  If this is an issue, we can stop that.  I would recommend that he go home with this.  4.  If his bleeding resolves, I do think he should follow-up in her office for urinalysis, check and symptom check while he is on the tamsulosin.  He has seen Dr. Mena GoesEskridge before, and I put instructions in for him to follow-up.  5.  I will sign off at this point.  However, if urologic consultation needed.  Again, during the hospitalization, please call us.    Pager:[phone]

## 2016-10-16 ENCOUNTER — Inpatient Hospital Stay (HOSPITAL_COMMUNITY): Payer: Medicare PPO

## 2016-10-16 DIAGNOSIS — I5032 Chronic diastolic (congestive) heart failure: Secondary | ICD-10-CM

## 2016-10-16 DIAGNOSIS — I248 Other forms of acute ischemic heart disease: Secondary | ICD-10-CM

## 2016-10-16 LAB — GLUCOSE, CAPILLARY
GLUCOSE-CAPILLARY: 193 mg/dL — AB (ref 65–99)
GLUCOSE-CAPILLARY: 234 mg/dL — AB (ref 65–99)
GLUCOSE-CAPILLARY: 241 mg/dL — AB (ref 65–99)
GLUCOSE-CAPILLARY: 261 mg/dL — AB (ref 65–99)
GLUCOSE-CAPILLARY: 290 mg/dL — AB (ref 65–99)
Glucose-Capillary: 227 mg/dL — ABNORMAL HIGH (ref 65–99)

## 2016-10-16 LAB — COMPREHENSIVE METABOLIC PANEL
ALK PHOS: 66 U/L (ref 38–126)
ALT: 16 U/L — AB (ref 17–63)
AST: 19 U/L (ref 15–41)
Albumin: 3.1 g/dL — ABNORMAL LOW (ref 3.5–5.0)
Anion gap: 11 (ref 5–15)
BUN: 55 mg/dL — AB (ref 6–20)
CALCIUM: 9.1 mg/dL (ref 8.9–10.3)
CHLORIDE: 99 mmol/L — AB (ref 101–111)
CO2: 27 mmol/L (ref 22–32)
CREATININE: 1.85 mg/dL — AB (ref 0.61–1.24)
GFR, EST AFRICAN AMERICAN: 36 mL/min — AB (ref >60)
GFR, EST NON AFRICAN AMERICAN: 31 mL/min — AB (ref >60)
Glucose, Bld: 240 mg/dL — ABNORMAL HIGH (ref 65–99)
Potassium: 3.8 mmol/L (ref 3.5–5.1)
Sodium: 137 mmol/L (ref 135–145)
Total Bilirubin: 0.6 mg/dL (ref 0.3–1.2)
Total Protein: 6.4 g/dL — ABNORMAL LOW (ref 6.5–8.1)

## 2016-10-16 LAB — MAGNESIUM: MAGNESIUM: 2.2 mg/dL (ref 1.7–2.4)

## 2016-10-16 LAB — CBC WITH DIFFERENTIAL/PLATELET
BASOS ABS: 0 10*3/uL (ref 0.0–0.1)
Basophils Relative: 0 %
EOS PCT: 0 %
Eosinophils Absolute: 0 10*3/uL (ref 0.0–0.7)
HCT: 36.1 % — ABNORMAL LOW (ref 39.0–52.0)
HEMOGLOBIN: 11.4 g/dL — AB (ref 13.0–17.0)
LYMPHS ABS: 0.7 10*3/uL (ref 0.7–4.0)
LYMPHS PCT: 10 %
MCH: 30.4 pg (ref 26.0–34.0)
MCHC: 31.6 g/dL (ref 30.0–36.0)
MCV: 96.3 fL (ref 78.0–100.0)
Monocytes Absolute: 0.7 10*3/uL (ref 0.1–1.0)
Monocytes Relative: 10 %
NEUTROS ABS: 5.9 10*3/uL (ref 1.7–7.7)
NEUTROS PCT: 80 %
PLATELETS: 181 10*3/uL (ref 150–400)
RBC: 3.75 MIL/uL — AB (ref 4.22–5.81)
RDW: 13.5 % (ref 11.5–15.5)
WBC: 7.4 10*3/uL (ref 4.0–10.5)

## 2016-10-16 LAB — HEMOGLOBIN A1C
HEMOGLOBIN A1C: 8 % — AB (ref 4.8–5.6)
MEAN PLASMA GLUCOSE: 183 mg/dL

## 2016-10-16 LAB — HEMOGLOBIN AND HEMATOCRIT, BLOOD
HCT: 33.6 % — ABNORMAL LOW (ref 39.0–52.0)
HCT: 34.5 % — ABNORMAL LOW (ref 39.0–52.0)
HCT: 36.5 % — ABNORMAL LOW (ref 39.0–52.0)
HEMOGLOBIN: 11 g/dL — AB (ref 13.0–17.0)
Hemoglobin: 10.8 g/dL — ABNORMAL LOW (ref 13.0–17.0)
Hemoglobin: 11.6 g/dL — ABNORMAL LOW (ref 13.0–17.0)

## 2016-10-16 LAB — ECHOCARDIOGRAM COMPLETE
Height: 67.5 in
Weight: 2854.4 oz

## 2016-10-16 MED ORDER — FUROSEMIDE 20 MG PO TABS
20.0000 mg | ORAL_TABLET | Freq: Every day | ORAL | Status: DC
  Administered 2016-10-16 – 2016-10-17 (×2): 20 mg via ORAL
  Filled 2016-10-16 (×2): qty 1

## 2016-10-16 MED ORDER — METHYLPREDNISOLONE SODIUM SUCC 40 MG IJ SOLR
40.0000 mg | Freq: Three times a day (TID) | INTRAMUSCULAR | Status: DC
  Administered 2016-10-16 – 2016-10-17 (×3): 40 mg via INTRAVENOUS
  Filled 2016-10-16 (×3): qty 1

## 2016-10-16 NOTE — Progress Notes (Signed)
OT Cancellation Note  Patient Details Name: Timothy Cordova MRN: [id number] DOB: 12/15/1928   Cancelled Treatment:    Reason Eval/Treat Not Completed: Patient at procedure or test/ unavailable (Will follow.)  [name], [name] 10/16/2016, 2:30 PM  [phone]

## 2016-10-16 NOTE — Progress Notes (Signed)
  Echocardiogram 2D Echocardiogram has been performed.  Pieter PartridgeBrooke S Sherrika Weakland 10/16/2016, 2:37 PM

## 2016-10-16 NOTE — Progress Notes (Signed)
Inpatient Diabetes Program Recommendations  AACE/ADA: New Consensus Statement on Inpatient Glycemic Control (2015)  Target Ranges:  Prepandial:   less than 140 mg/dL      Peak postprandial:   less than 180 mg/dL (1-2 hours)      Critically ill patients:  140 - 180 mg/dL  Results for Timothy Cordova, Timothy Cordova (MRN [id number]) as of 10/16/2016 13:14  Ref. Range 10/15/2016 08:14 10/15/2016 11:42 10/15/2016 16:15 10/15/2016 20:22 10/16/2016 00:02 10/16/2016 04:53 10/16/2016 08:21 10/16/2016 12:40  Glucose-Capillary Latest Ref Range: 65 - 99 mg/dL 956302 (H) 213249 (H) 086356 (H) 289 (H) 240 (H) 227 (H) 234 (H) 193 (H)   Results for Timothy Cordova, Timothy Cordova (MRN [id number]) as of 10/16/2016 13:14  Ref. Range 10/15/2016 04:28  Hemoglobin A1C Latest Ref Range: 4.8 - 5.6 % 8.0 (H)   Review of Glycemic Control  Diabetes history: DM2 Outpatient Diabetes medications: Glipizide 5 mg BID Current orders for Inpatient glycemic control: Lantus 10 units QHS, Novolog 0-9 units Q4H, Novolog 5 units TID with meals  Inpatient Diabetes Program Recommendations: Insulin - Basal: If steroids are continued as ordered, please consider increasing Lantus to 15 units QHS. Insulin - Meal Coverage: If steroids are continued as ordered, please consider increasing meal coverage to Novolog 8 units TID with meals. HgbA1C: A1C 8.0% on 10/15/16 indicating an average glucose of 183 mg/dl over the past 2-3 months.  Thanks, Orlando PennerMarie Dariane Natzke, RN, MSN, CDE Diabetes Coordinator Inpatient Diabetes Program [phone] (Team Pager from 8am to 5pm)

## 2016-10-16 NOTE — Evaluation (Addendum)
Physical Therapy Evaluation Patient Details Name: Timothy Postinhomas O Melikian MRN: [id number] DOB: 10/18/1928 Today's Date: 10/16/2016   History of Present Illness  Timothy Cordova is a 83 y.o. male with medical history significant for COPD, coronary artery disease, chronic diastolic CHF, chronic kidney disease stage III, hypertension, and type 2 diabetes mellitus.  Admitted with SOB due to COPD exacerbation and positive troponins thought due to demand ischemia.   Clinical Impression  Patient presents with decreased mobility due to general weakness and limited mobility prior to admission.  Relates used chairs at intervals while going to [employer].  Eager to return to home to care for his wife.  Feel should be safe for d/c home with son intermittent assist.  Likely close to baseline so no follow up PT, but will follow acutely and recommend nursing assist with ambulation in hallway as well.    Follow Up Recommendations No PT follow up;Supervision - Intermittent    Equipment Recommendations  None recommended by PT    Recommendations for Other Services       Precautions / Restrictions Precautions Precautions: Fall Precaution Comments: reports limited gait distance at home due to hips getting weak/painful and SOB (300 yards to mailbox, takes three rest breaks along the way.)      Mobility  Bed Mobility               General bed mobility comments: seated EOB  Transfers Overall transfer level: Needs assistance Equipment used: Rolling walker (2 wheeled) Transfers: Sit to/from Stand Sit to Stand: Supervision         General transfer comment: for safety with telemetry wires  Ambulation/Gait Ambulation/Gait assistance: Supervision Ambulation Distance (Feet): 140 Feet Assistive device: Rolling walker (2 wheeled) Gait Pattern/deviations: Step-through pattern;Trunk flexed;Decreased stride length;Shuffle     General Gait Details: slow pace, steady with walker  Stairs             Wheelchair Mobility    Modified Rankin (Stroke Patients Only)       Balance Overall balance assessment: Needs assistance   Sitting balance-Leahy Scale: Good     Standing balance support: No upper extremity supported Standing balance-Leahy Scale: Fair Standing balance comment: can stand without UE support, but walker for ambulation, one LOB prior to walking, pt reported checking to see that cords not in the way and LOB to R minguard to recover                             Pertinent Vitals/Pain Pain Assessment: No/denies pain  Orthostatic VS for the past 24 hrs (Last 3 readings):  BP- Sitting Pulse- Sitting BP- Standing at 0 minutes Pulse- Standing at 0 minutes BP- Standing at 3 minutes Pulse- Standing at 3 minutes  10/16/16 0900 (!) 118/94 77 100/54 80 111/70 85      Home Living Family/patient expects to be discharged to:: Private residence (log cabin) Living Arrangements: Spouse/significant other;Children (son who works) Available Help at Discharge: Family Type of Home: House Home Access: Ramped entrance     Home Layout: Two level;Able to live on main level with bedroom/bathroom Home Equipment: Dan HumphreysWalker - 2 wheels;Walker - 4 wheels;Cane - single point;Bedside commode;Shower seat;Grab bars - toilet;Grab bars - tub/shower      Prior Function Level of Independence: Independent         Comments: takes care of his wife who has cancer, still gardens in raised beds with "sliding chair"     Hand  Dominance        Extremity/Trunk Assessment   Upper Extremity Assessment Upper Extremity Assessment: Defer to OT evaluation    Lower Extremity Assessment Lower Extremity Assessment: Generalized weakness    Cervical / Trunk Assessment Cervical / Trunk Assessment: Kyphotic  Communication   Communication: No difficulties  Cognition Arousal/Alertness: Awake/alert Behavior During Therapy: WFL for tasks assessed/performed Overall Cognitive Status: Within  Functional Limits for tasks assessed                                        General Comments General comments (skin integrity, edema, etc.): SpO2 92% with ambulation, but pt subjectively with SOB and needing rest (but states more due to hips giving him trouble)    Exercises     Assessment/Plan    PT Assessment Patient needs continued PT services  PT Problem List Decreased strength;Decreased mobility;Decreased balance;Decreased activity tolerance;Cardiopulmonary status limiting activity       PT Treatment Interventions DME instruction;Therapeutic activities;Gait training;Therapeutic exercise;Patient/family education;Functional mobility training;Balance training    PT Goals (Current goals can be found in the Care Plan section)  Acute Rehab PT Goals Patient Stated Goal: To return home to care for wife PT Goal Formulation: With patient Time For Goal Achievement: 10/20/16 Potential to Achieve Goals: Good    Frequency Min 3X/week   Barriers to discharge        Co-evaluation               AM-PAC PT "6 Clicks" Daily Activity  Outcome Measure Difficulty turning over in bed (including adjusting bedclothes, sheets and blankets)?: None Difficulty moving from lying on back to sitting on the side of the bed? : None Difficulty sitting down on and standing up from a chair with arms (e.g., wheelchair, bedside commode, etc,.)?: A Little Help needed moving to and from a bed to chair (including a wheelchair)?: A Little Help needed walking in hospital room?: A Little Help needed climbing 3-5 steps with a railing? : A Little 6 Click Score: 20    End of Session Equipment Utilized During Treatment: Gait belt Activity Tolerance: Patient limited by fatigue Patient left: in chair;with call bell/phone within reach   PT Visit Diagnosis: Other abnormalities of gait and mobility (R26.89)    Time: 9604-5409 PT Time Calculation (min) (ACUTE ONLY): 30 min   Charges:   PT  Evaluation $PT Eval Moderate Complexity: 1 Procedure PT Treatments $Gait Training: 8-22 mins   PT G CodesSheran Lawless, Pierce [phone] 10/16/2016   Elray Mcgregor 10/16/2016, 10:26 AM

## 2016-10-16 NOTE — Progress Notes (Signed)
Progress Note  Patient Name: Timothy Cordova Date of Encounter: 10/16/2016  Primary Cardiologist: Dr. Mariah Milling  Subjective   Overall breathing mildly improved, no chest pain. Had elevated troponin on admission. Physical therapy currently in room helping him.  Inpatient Medications    Scheduled Meds: . atorvastatin  40 mg Oral Daily  . doxycycline  100 mg Oral Q12H  . guaiFENesin  600 mg Oral BID  . insulin aspart  0-9 Units Subcutaneous Q4H  . insulin aspart  5 Units Subcutaneous TID WC  . insulin glargine  10 Units Subcutaneous QHS  . ipratropium-albuterol  3 mL Nebulization QID  . methylPREDNISolone (SOLU-MEDROL) injection  60 mg Intravenous Q8H  . metoprolol  100 mg Oral Daily  . mometasone-formoterol  2 puff Inhalation BID  . sodium chloride flush  3 mL Intravenous Q12H  . tamsulosin  0.4 mg Oral QPC supper   Continuous Infusions:  PRN Meds: acetaminophen **OR** acetaminophen, albuterol, HYDROcodone-acetaminophen, ondansetron **OR** ondansetron (ZOFRAN) IV   Vital Signs    Vitals:   10/15/16 2335 10/16/16 0457 10/16/16 0800 10/16/16 0855  BP: (!) 155/56 (!) 152/62    Pulse: 80 78 67   Resp: 20 (!) 25    Temp: (!) 97.5 F (36.4 C) 97.6 F (36.4 C)    TempSrc: Axillary Oral    SpO2: 98% 94% 94% 97%  Weight:  178 lb 6.4 oz (80.9 kg)    Height:        Intake/Output Summary (Last 24 hours) at 10/16/16 0913 Last data filed at 10/15/16 2336  Gross per 24 hour  Intake              183 ml  Output              325 ml  Net             -142 ml   Filed Weights   10/14/16 1515 10/15/16 0308 10/16/16 0457  Weight: 195 lb (88.5 kg) 178 lb 12.8 oz (81.1 kg) 178 lb 6.4 oz (80.9 kg)    Telemetry    No adverse arrhythmias - Personally Reviewed  ECG    Sinus rhythm with right bundle branch block and frequent PVCs - Personally Reviewed  Physical Exam   GEN: No acute distress.   Neck: No JVD Cardiac: RRR, no murmurs, rubs, or gallops. Frequent  ectopy. Respiratory:  wheezes bilaterally  GI: Soft, nontender, non-distended  MS: No edema; No deformity. Neuro:  Nonfocal  Psych: Normal affect, states that he takes care of his wife at home, primary caregiver.  Labs    Chemistry Recent Labs Lab 10/14/16 1510 10/15/16 0428 10/16/16 0241  NA 133* 132* 137  K 4.9 4.3 3.8  CL 100* 97* 99*  CO2 22 25 27   GLUCOSE 189* 389* 240*  BUN 31* 39* 55*  CREATININE 1.76* 1.81* 1.85*  CALCIUM 9.0 9.0 9.1  PROT 6.7 6.3* 6.4*  ALBUMIN 3.4* 3.0* 3.1*  AST 20 16 19   ALT 14* 14* 16*  ALKPHOS 72 67 66  BILITOT 0.9 0.7 0.6  GFRNONAA 33* 32* 31*  GFRAA 39* 37* 36*  ANIONGAP 11 10 11      Hematology Recent Labs Lab 10/14/16 1510 10/15/16 0428 10/15/16 1208 10/15/16 2333 10/16/16 0241  WBC 11.3* 5.0  --   --  7.4  RBC 4.07* 3.67*  --   --  3.75*  HGB 12.2* 10.9* 11.9* 10.8* 11.4*  HCT 39.2 35.4* 36.9* 33.6* 36.1*  MCV 96.3 96.5  --   --  96.3  MCH 30.0 29.7  --   --  30.4  MCHC 31.1 30.8  --   --  31.6  RDW 14.0 13.7  --   --  13.5  PLT 166 137*  --   --  181    Cardiac Enzymes Recent Labs Lab 10/14/16 2013 10/15/16 0428 10/15/16 0958 10/15/16 1510  TROPONINI 0.39* 0.18* 0.12* 0.17*    Recent Labs Lab 10/14/16 1527  TROPIPOC 0.34*     BNP Recent Labs Lab 10/14/16 1510  BNP 360.9*     DDimer  Recent Labs Lab 10/14/16 2013  DDIMER 1.42*     Radiology    Dg Chest 2 View  Result Date: 10/14/2016 CLINICAL DATA:  Shortness of Breath with worsening over the last 2 days. EXAM: CHEST  2 VIEW COMPARISON:  08/29/2016 FINDINGS: Lungs are hyperexpanded. Interstitial markings are diffusely coarsened with chronic features. Nodular right suprahilar density stable since 10/27/2015 and no nodule seen at this location on CT scan of 12/10/2015. No focal airspace consolidation. No pulmonary edema or pleural effusion. The cardio pericardial silhouette is enlarged. Patient is status post CABG. The visualized bony structures of  the thorax are intact. Telemetry leads overlie the chest. IMPRESSION: Cardiomegaly with emphysema.  No acute cardiopulmonary process. Electronically Signed   By: Kennith CenterEric  Mansell M.D.   On: 10/14/2016 17:03   [place] Renal  Result Date: 10/15/2016 CLINICAL DATA:  Gross hematuria. EXAM: RENAL / URINARY TRACT ULTRASOUND COMPLETE COMPARISON:  MRI head 03/29/2012 FINDINGS: Right Kidney: Length: 11.0 cm. No hydronephrosis. Diffuse cortical thinning. Small hypoechoic 12 mm lesion identified in the lower pole region and a second 17 mm lesion is identified in the interpolar region. Left Kidney: Length: 12.2 cm. No hydronephrosis. Three cysts are identified, including a dominant 7 cm lesion towards the lower pole. Bladder: Appears normal for degree of bladder distention. IMPRESSION: 1. The patient is bilateral renal cysts which were also visible on the prior MRI although a particular lesion in the superficial aspect of the interpolar right kidney was not present on the prior MRI. This represents a 17 mm hypoechoic lesion on today's study. Given the history of hematuria, further imaging workup is likely warranted and MRI without and with contrast would be the study of choice to further evaluate. Electronically Signed   By: Kennith CenterEric  Mansell M.D.   On: 10/15/2016 09:13    Cardiac Studies   Echocardiogram 08/31/16: - Left ventricle: The cavity size was normal. Wall thickness was   increased in a pattern of mild LVH. Systolic function was normal.   The estimated ejection fraction was in the range of 55% to 60%.   Wall motion was normal; there were no regional wall motion   abnormalities. Doppler parameters are consistent with abnormal   left ventricular relaxation (grade 1 diastolic dysfunction). - Left atrium: The atrium was mildly dilated. - Pericardium, extracardiac: A trivial pericardial effusion was   identified.  Impressions:  - Normal LV systolic function; mild diastolic dysfunction; mild   LVH; mild LAE; trace  MR and TR.  Patient Profile     83 y.o. male with CAD post CABG, LIMA to LAD, SVG to OM 3 with essential hypertension, hyperlipidemia obstructive sleep apnea diabetes pulmonary nodule chronic diastolic heart failure and centrilobular emphysema who presented with 4 days of dyspnea. Yesterday had elevated troponin consistent with demand ischemia. Appeared to have symptoms of COPD exacerbation on admission. Low-grade fever. No chest pain. EF normal 55-60%.  Assessment & Plan  Coronary artery disease status was CABG  - Catheterization on 04/29/13 by Dr. Kirke Corin. Bypass grafts were widely patent.  - Normal ejection fraction, reassuring.  - Continue with metoprolol.  COPD exacerbation  - Currently obtaining IV Solu-Medrol  - Lungs are still quite wheezy bilaterally.  Demand ischemia  - Mildly elevated troponin of 0.39 to 0.17 in the setting of COPD exacerbation.  - Has known underlying coronary artery disease. Not a type I MI. Continue to treat medically, underlying condition.  - Repeat echocardiogram pending  Chronic diastolic heart failure  - Superimposed upon COPD.  - Careful administration of Lasix. We will resume Lasix dose at 20 mg a day.  CKD 3   - Careful with Lasix.  Hematuria  - Urology note reviewed.  Aortic atherosclerosis  - Continue with aggressive medical management.  No further cardiology recommendations. Please let us know if we can be of further assistance, we will sign off.  Signed, Donato Schultz, MD  10/16/2016, 9:13 AM

## 2016-10-16 NOTE — Progress Notes (Signed)
Nutrition Brief Note  Received consult from COPD Gold order set.   No significant weight loss recently. Nutrition focused physical exam completed.  No muscle or subcutaneous fat depletion noticed.  Body mass index is 27.53 kg/m. Patient meets criteria for overweight based on current BMI.   Current diet order is CHO modified, patient is consuming approximately 75-100% of meals at this time. Labs and medications reviewed.   No nutrition interventions warranted at this time. If nutrition issues arise, please consult RD.   Timothy Cordova, RD, LDN, CNSC Pager [phone] After Hours Pager [phone]

## 2016-10-16 NOTE — Care Management Note (Addendum)
Case Management Note  Patient Details  Name: Timothy Cordova MRN: [id number] Date of Birth: [birth year]  Subjective/Objective: Pt presented for SOB- COPD Exacerbation and positive troponin. Pt continues on IV Solumedrol. Pt is from home and he is the caregiver for his wife. Son is looking in on mother while patient is in the hospital. Per pt he was independent PTA and he has a Rollator if needed.                     Action/Plan: New CM Referral for COPD GOLD: CM did discuss Home Needs with pt and he states he will not need any HH Services- declining services at this time. CM did offer Personal Care Services Agency List and pt declined as well. No further needs from CM at this time.   Expected Discharge Date:                  Expected Discharge Plan:  Home/Self Care  In-House Referral:  NA  Discharge planning Services  CM Consult  Post Acute Care Choice:  NA Choice offered to:  NA  DME Arranged:  N/A DME Agency:  NA  HH Arranged:  NA HH Agency:  NA  Status of Service:  Completed, signed off  If discussed at Long Length of Stay Meetings, dates discussed:    Additional Comments:  Gala LewandowskyGraves-Bigelow, Leigh Blas Kaye, RN 10/16/2016, 12:38 PM

## 2016-10-16 NOTE — Plan of Care (Signed)
Problem: Safety: Goal: Ability to remain free from injury will improve Outcome: Progressing Patient is still having SOB with exertion. Education provided on how to use the call bell, and patient is encouraged to  call staff for assistance before getting OOB. Patient acknowledge understanding. Call bell within reach.

## 2016-10-16 NOTE — Progress Notes (Signed)
Patient need a new IV. Accidentally pulled IV out.  Patient is scheduled for Solu-Medrol.  Thank you

## 2016-10-16 NOTE — Progress Notes (Signed)
Patient ID: Timothy Cordova, male   DOB: August 03, 1928, 83 y.o.   MRN: [id number]  PROGRESS NOTE    Timothy Cordova  WUJ:[id number] DOB: 09/18/1928 DOA: 10/14/2016 PCP: Hannah Beat, MD   Brief Narrative:  83 year old male with history of COPD with centrilobular emphysema not on oxygen, chronic kidney disease, chronic diastolic CHF, coronary artery disease, diabetes mellitus type 2, solitary pulmonary nodule, sleep apnea, dyslipidemia, BPH, hypertension presented with worsening dyspnea and hypoxia. He was admitted for probable COPD exacerbation and positive troponins. Cardiology has evaluated the patient. He complained of hematuria on 10/15/2016 which has resolved. Urology has evaluated the patient   Assessment & Plan:   Active Problems:   DM (diabetes mellitus), type 2 with renal complications (HCC)   Mixed hyperlipidemia   Essential hypertension   CAD, AUTOLOGOUS BYPASS GRAFT   DIASTOLIC HEART FAILURE, CHRONIC   Centrilobular emphysema (HCC)   CKD (chronic kidney disease) stage 3, GFR 30-59 ml/min   COPD with acute exacerbation (HCC)   Dyspnea   Elevated troponin level   Acute respiratory failure (HCC)   COPD exacerbation (HCC)   Hematuria - Improved. Hemoglobin stable. Urology evaluation appreciated. Outpatient follow-up with urology - Renal ultrasound showed bilateral renal cysts with no hydronephrosis.  - Keep holding Lovenox and aspirin  Probable COPD exacerbation causing hypoxia - Decrease  Solu-Medrol to 40 mg IV every 8 hours. Continue Dulera. Continue DuoNeb and doxycycline for now. - Respiratory status improving.  Positive troponins - Most likely secondary to demand ischemia. Troponins did not trend up. Cardiology evaluation and follow-up appreciated. Discontinue aspirin and Lovenox because of hematuria. continue metoprolol and statin. Follow-up Echo  Chronic diastolic heart failure - Currently compensated. Resume Lasix as per cardiology  recommendations  Chronic kidney disease stage III - Creatinine at baseline. Monitor creatinine and urine output  Diabetes mellitus type 2 - Sliding-scale insulin  Hypertension - Monitor blood pressure. Continue metoprolol and resume Lasix  Coronary artery disease with history of bypass graft - Follow cardiology recommendations  Dyslipidemia - Continue statins  DVT prophylaxis:  start SCDs Code Status:  Okay to resuscitate but DO NOT INTUBATE Family Communication: None at bedside Disposition Plan: Home in 1-3 days if condition improves  Consultants: Cardiology, urology  Procedures: Echo pending  Antimicrobials: Doxycycline from 10/14/2016    Subjective: Patient seen and examined at bedside. He feels much better. His hematuria has stopped. No overnight fever, nausea, vomiting.  Objective: Vitals:   10/15/16 2335 10/16/16 0457 10/16/16 0800 10/16/16 0855  BP: (!) 155/56 (!) 152/62    Pulse: 80 78 67   Resp: 20 (!) 25    Temp: (!) 97.5 F (36.4 C) 97.6 F (36.4 C)    TempSrc: Axillary Oral    SpO2: 98% 94% 94% 97%  Weight:  80.9 kg (178 lb 6.4 oz)    Height:        Intake/Output Summary (Last 24 hours) at 10/16/16 1024 Last data filed at 10/15/16 2336  Gross per 24 hour  Intake              183 ml  Output              325 ml  Net             -142 ml   Filed Weights   10/14/16 1515 10/15/16 0308 10/16/16 0457  Weight: 88.5 kg (195 lb) 81.1 kg (178 lb 12.8 oz) 80.9 kg (178 lb 6.4 oz)    Examination:  General  exam: Appears calm and comfortable  Respiratory system: Bilateral decreased breath sound at bases with scattered crackles and some wheezing. Intermittent tachypnea Cardiovascular system: S1 & S2 heard, rate controlled  Gastrointestinal system: Abdomen is nondistended, soft and nontender. Normal bowel sounds heard. Extremities: No cyanosis, clubbing; trace pitting pedal edema     Data Reviewed: I have personally reviewed following labs and  imaging studies  CBC:  Recent Labs Lab 10/14/16 1510 10/15/16 0428 10/15/16 1208 10/15/16 2333 10/16/16 0241  WBC 11.3* 5.0  --   --  7.4  NEUTROABS 8.3*  --   --   --  5.9  HGB 12.2* 10.9* 11.9* 10.8* 11.4*  HCT 39.2 35.4* 36.9* 33.6* 36.1*  MCV 96.3 96.5  --   --  96.3  PLT 166 137*  --   --  181   Basic Metabolic Panel:  Recent Labs Lab 10/14/16 1510 10/15/16 0428 10/16/16 0241  NA 133* 132* 137  K 4.9 4.3 3.8  CL 100* 97* 99*  CO2 22 25 27   GLUCOSE 189* 389* 240*  BUN 31* 39* 55*  CREATININE 1.76* 1.81* 1.85*  CALCIUM 9.0 9.0 9.1  MG  --  1.8 2.2  PHOS  --  4.4  --    GFR: Estimated Creatinine Clearance: 29.5 mL/min (A) (by C-G formula based on SCr of 1.85 mg/dL (H)). Liver Function Tests:  Recent Labs Lab 10/14/16 1510 10/15/16 0428 10/16/16 0241  AST 20 16 19   ALT 14* 14* 16*  ALKPHOS 72 67 66  BILITOT 0.9 0.7 0.6  PROT 6.7 6.3* 6.4*  ALBUMIN 3.4* 3.0* 3.1*   No results for input(s): LIPASE, AMYLASE in the last 168 hours. No results for input(s): AMMONIA in the last 168 hours. Coagulation Profile: No results for input(s): INR, PROTIME in the last 168 hours. Cardiac Enzymes:  Recent Labs Lab 10/14/16 2013 10/15/16 0428 10/15/16 0958 10/15/16 1510  TROPONINI 0.39* 0.18* 0.12* 0.17*   BNP (last 3 results) No results for input(s): PROBNP in the last 8760 hours. HbA1C:  Recent Labs  10/15/16 0428  HGBA1C 8.0*   CBG:  Recent Labs Lab 10/15/16 1615 10/15/16 2022 10/16/16 0002 10/16/16 0453 10/16/16 0821  GLUCAP 356* 289* 240* 227* 234*   Lipid Profile: No results for input(s): CHOL, HDL, LDLCALC, TRIG, CHOLHDL, LDLDIRECT in the last 72 hours. Thyroid Function Tests:  Recent Labs  10/15/16 0428  TSH 0.367   Anemia Panel: No results for input(s): VITAMINB12, FOLATE, FERRITIN, TIBC, IRON, RETICCTPCT in the last 72 hours. Sepsis Labs: No results for input(s): PROCALCITON, LATICACIDVEN in the last 168 hours.  Recent Results  (from the past 240 hour(s))  Respiratory Panel by PCR     Status: None   Collection Time: 10/15/16  3:23 AM  Result Value Ref Range Status   Adenovirus NOT DETECTED NOT DETECTED Final   Coronavirus 229E NOT DETECTED NOT DETECTED Final   Coronavirus HKU1 NOT DETECTED NOT DETECTED Final   Coronavirus NL63 NOT DETECTED NOT DETECTED Final   Coronavirus OC43 NOT DETECTED NOT DETECTED Final   Metapneumovirus NOT DETECTED NOT DETECTED Final   Rhinovirus / Enterovirus NOT DETECTED NOT DETECTED Final   Influenza A NOT DETECTED NOT DETECTED Final   Influenza B NOT DETECTED NOT DETECTED Final   Parainfluenza Virus 1 NOT DETECTED NOT DETECTED Final   Parainfluenza Virus 2 NOT DETECTED NOT DETECTED Final   Parainfluenza Virus 3 NOT DETECTED NOT DETECTED Final   Parainfluenza Virus 4 NOT DETECTED NOT DETECTED Final  Respiratory Syncytial Virus NOT DETECTED NOT DETECTED Final   Bordetella pertussis NOT DETECTED NOT DETECTED Final   Chlamydophila pneumoniae NOT DETECTED NOT DETECTED Final   Mycoplasma pneumoniae NOT DETECTED NOT DETECTED Final  MRSA PCR Screening     Status: None   Collection Time: 10/15/16  3:23 AM  Result Value Ref Range Status   MRSA by PCR NEGATIVE NEGATIVE Final    Comment:        The GeneXpert MRSA Assay (FDA approved for NASAL specimens only), is one component of a comprehensive MRSA colonization surveillance program. It is not intended to diagnose MRSA infection nor to guide or monitor treatment for MRSA infections.          Radiology Studies: Dg Chest 2 View  Result Date: 10/14/2016 CLINICAL DATA:  Shortness of Breath with worsening over the last 2 days. EXAM: CHEST  2 VIEW COMPARISON:  08/29/2016 FINDINGS: Lungs are hyperexpanded. Interstitial markings are diffusely coarsened with chronic features. Nodular right suprahilar density stable since 10/27/2015 and no nodule seen at this location on CT scan of 12/10/2015. No focal airspace consolidation. No pulmonary  edema or pleural effusion. The cardio pericardial silhouette is enlarged. Patient is status post CABG. The visualized bony structures of the thorax are intact. Telemetry leads overlie the chest. IMPRESSION: Cardiomegaly with emphysema.  No acute cardiopulmonary process. Electronically Signed   By: Kennith Center M.D.   On: 10/14/2016 17:03   US Renal  Result Date: 10/15/2016 CLINICAL DATA:  Gross hematuria. EXAM: RENAL / URINARY TRACT ULTRASOUND COMPLETE COMPARISON:  MRI head 03/29/2012 FINDINGS: Right Kidney: Length: 11.0 cm. No hydronephrosis. Diffuse cortical thinning. Small hypoechoic 12 mm lesion identified in the lower pole region and a second 17 mm lesion is identified in the interpolar region. Left Kidney: Length: 12.2 cm. No hydronephrosis. Three cysts are identified, including a dominant 7 cm lesion towards the lower pole. Bladder: Appears normal for degree of bladder distention. IMPRESSION: 1. The patient is bilateral renal cysts which were also visible on the prior MRI although a particular lesion in the superficial aspect of the interpolar right kidney was not present on the prior MRI. This represents a 17 mm hypoechoic lesion on today's study. Given the history of hematuria, further imaging workup is likely warranted and MRI without and with contrast would be the study of choice to further evaluate. Electronically Signed   By: Kennith Center M.D.   On: 10/15/2016 09:13        Scheduled Meds: . atorvastatin  40 mg Oral Daily  . doxycycline  100 mg Oral Q12H  . furosemide  20 mg Oral Daily  . guaiFENesin  600 mg Oral BID  . insulin aspart  0-9 Units Subcutaneous Q4H  . insulin aspart  5 Units Subcutaneous TID WC  . insulin glargine  10 Units Subcutaneous QHS  . ipratropium-albuterol  3 mL Nebulization QID  . methylPREDNISolone (SOLU-MEDROL) injection  60 mg Intravenous Q8H  . metoprolol  100 mg Oral Daily  . mometasone-formoterol  2 puff Inhalation BID  . sodium chloride flush  3 mL  Intravenous Q12H  . tamsulosin  0.4 mg Oral QPC supper   Continuous Infusions:   LOS: 2 days        Glade Lloyd, MD [facility] Pager [phone]  If 7PM-7AM, please contact night-coverage [email or web address] Password [email or web address] 10/16/2016, 10:24 AM

## 2016-10-17 DIAGNOSIS — E1121 Type 2 diabetes mellitus with diabetic nephropathy: Secondary | ICD-10-CM

## 2016-10-17 LAB — HEMOGLOBIN A1C
Hgb A1c MFr Bld: 7.8 % — ABNORMAL HIGH (ref 4.8–5.6)
Mean Plasma Glucose: 177 mg/dL

## 2016-10-17 LAB — BASIC METABOLIC PANEL
Anion gap: 9 (ref 5–15)
BUN: 68 mg/dL — AB (ref 6–20)
CHLORIDE: 100 mmol/L — AB (ref 101–111)
CO2: 27 mmol/L (ref 22–32)
CREATININE: 1.75 mg/dL — AB (ref 0.61–1.24)
Calcium: 9.2 mg/dL (ref 8.9–10.3)
GFR calc Af Amer: 39 mL/min — ABNORMAL LOW (ref >60)
GFR calc non Af Amer: 34 mL/min — ABNORMAL LOW (ref >60)
GLUCOSE: 229 mg/dL — AB (ref 65–99)
POTASSIUM: 3.9 mmol/L (ref 3.5–5.1)
Sodium: 136 mmol/L (ref 135–145)

## 2016-10-17 LAB — CBC WITH DIFFERENTIAL/PLATELET
Basophils Absolute: 0 10*3/uL (ref 0.0–0.1)
Basophils Relative: 0 %
EOS ABS: 0 10*3/uL (ref 0.0–0.7)
Eosinophils Relative: 0 %
HEMATOCRIT: 35.2 % — AB (ref 39.0–52.0)
HEMOGLOBIN: 11.3 g/dL — AB (ref 13.0–17.0)
LYMPHS ABS: 0.6 10*3/uL — AB (ref 0.7–4.0)
LYMPHS PCT: 6 %
MCH: 30.5 pg (ref 26.0–34.0)
MCHC: 32.1 g/dL (ref 30.0–36.0)
MCV: 94.9 fL (ref 78.0–100.0)
MONOS PCT: 11 %
Monocytes Absolute: 1 10*3/uL (ref 0.1–1.0)
NEUTROS ABS: 7.6 10*3/uL (ref 1.7–7.7)
NEUTROS PCT: 83 %
Platelets: 183 10*3/uL (ref 150–400)
RBC: 3.71 MIL/uL — AB (ref 4.22–5.81)
RDW: 13.5 % (ref 11.5–15.5)
WBC: 9.2 10*3/uL (ref 4.0–10.5)

## 2016-10-17 LAB — GLUCOSE, CAPILLARY
GLUCOSE-CAPILLARY: 219 mg/dL — AB (ref 65–99)
Glucose-Capillary: 233 mg/dL — ABNORMAL HIGH (ref 65–99)

## 2016-10-17 LAB — MAGNESIUM: Magnesium: 2.2 mg/dL (ref 1.7–2.4)

## 2016-10-17 MED ORDER — GUAIFENESIN ER 600 MG PO TB12: 600.0000 mg | ORAL_TABLET | Freq: Two times a day (BID) | ORAL | 0 refills | Status: DC

## 2016-10-17 MED ORDER — PREDNISONE 10 MG PO TABS: ORAL_TABLET | ORAL | 0 refills | Status: DC

## 2016-10-17 MED ORDER — IPRATROPIUM-ALBUTEROL 0.5-2.5 (3) MG/3ML IN SOLN
3.0000 mL | Freq: Three times a day (TID) | RESPIRATORY_TRACT | Status: DC
  Administered 2016-10-17: 3 mL via RESPIRATORY_TRACT
  Filled 2016-10-17: qty 3

## 2016-10-17 NOTE — Consult Note (Signed)
   [facility]   10/17/2016  Timothy Cordova 1928/05/18 [id number]   Went to bedside to speak with [name] and wife about [facility].   [name] pleasantly declines by saying " I just do not think I need those services, I know what to do".   Provided [facility] broch[facility] with contact information along with 24-hr nurse line magnet.   He is agreeable to [facility] in the future should he change his mind.  Made inpatient RNCM aware.   Timothy NobleAtika Metta Koranda, MSN-Ed, RN,BSN [facility] Hospital Liaison [phone]

## 2016-10-17 NOTE — Progress Notes (Signed)
Pt educated as to purpose of bed alarm yet refuses to let staff use it.  Will monitor pt.

## 2016-10-17 NOTE — Discharge Summary (Signed)
Physician Discharge Summary  Timothy Cordova:[id number] DOB: 11-Jul-1928 DOA: 10/14/2016  PCP: Hannah Beat, MD  Admit date: 10/14/2016 Discharge date: 10/17/2016  Admitted From: home Disposition:  Home  Recommendations for Outpatient Follow-up:  1. Follow up with PCP in 1week 2. Please obtain BMP/CBC in one week 3. Follow-up with urology in 1-2 weeks 4. Please follow up with cardiologist in 1-2 weeks   Home Health: No  Equipment/Devices: None  Discharge Condition: Guarded  CODE STATUS: Okay to resuscitate but DO NOT INTUBATE Diet recommendation: Heart Healthy / Carb Modified   Brief/Interim Summary: 83 year old male with history of COPD with centrilobular emphysema not on oxygen, chronic kidney disease, chronic diastolic CHF, coronary artery disease, diabetes mellitus type 2, solitary pulmonary nodule, sleep apnea, dyslipidemia, BPH, hypertension presented with worsening dyspnea and hypoxia. He was admitted for probable COPD exacerbation and positive troponins. Cardiology has evaluated the patient. He complained of hematuria on 10/15/2016 which has resolved. Urology has evaluated the patient. Hematuria has stopped. Patient feels much better.  Discharge Diagnoses:  Active Problems:   DM (diabetes mellitus), type 2 with renal complications (HCC)   Mixed hyperlipidemia   Essential hypertension   CAD, AUTOLOGOUS BYPASS GRAFT   DIASTOLIC HEART FAILURE, CHRONIC   Centrilobular emphysema (HCC)   CKD (chronic kidney disease) stage 3, GFR 30-59 ml/min   COPD with acute exacerbation (HCC)   Dyspnea   Elevated troponin level   Acute respiratory failure (HCC)   COPD exacerbation (HCC)    Hematuria - Improved. Hemoglobin stable. Urology evaluation appreciated. Outpatient follow-up with urology - Renal ultrasound showed bilateral renal cysts with no hydronephrosis.  - Hold aspirin on discharge till evaluation by urology.  Probable COPD exacerbation causing hypoxia -  Currently on  Solu-Medrol 40 mg IV every 8 hours.  - Respiratory status improving. - Switch to oral prednisone and finish tapering course. Outpatient follow-up with primary care provider. Patient might need pulmonary evaluation. Continue home albuterol and Stiolto Respimat. Follow-up with primary care provider regarding inhaled steroid need.  Positive troponins - Most likely secondary to demand ischemia. Troponins did not trend up. Cardiology evaluation and follow-up appreciated. metoprolol and statin. No further workup indicated. Echo showed ejection fraction of 60-65% with grade 1 diastolic dysfunction. - Outpatient follow-up with cardiology  Chronic diastolic heart failure - Currently compensated. Continue Lasix. Outpatient follow-up with cardiology. Monitor BMP as an outpatient  Chronic kidney disease stage III - Creatinine at baseline. Monitor BMP as an outpatient  Diabetes mellitus type 2 Outpatient follow-up. Continue glipizide  Hypertension - Monitor blood pressure. Continue metoprolol and Lasix  Coronary artery disease with history of bypass graft - Outpatient cardiology follow-up  Dyslipidemia - Continue statins  Discharge Instructions  Discharge Instructions    Call MD for:  difficulty breathing, headache or visual disturbances    Complete by:  As directed    Call MD for:  extreme fatigue    Complete by:  As directed    Call MD for:  persistant dizziness or light-headedness    Complete by:  As directed    Call MD for:  persistant nausea and vomiting    Complete by:  As directed    Call MD for:  severe uncontrolled pain    Complete by:  As directed    Call MD for:  temperature >100.4    Complete by:  As directed    Diet - low sodium heart healthy    Complete by:  As directed    Diet Carb  Modified    Complete by:  As directed    Increase activity slowly    Complete by:  As directed      Allergies as of 10/17/2016      Reactions   Metformin And Related  Nausea And Vomiting   Tramadol Hcl Other (See Comments)   Tremor vs seizure activity   Ace Inhibitors Cough   Atorvastatin Other (See Comments)   Makes the patient "weak"   Codeine Nausea And Vomiting   Gabapentin Other (See Comments)   "Messed up my nervous system"   Morphine Sulfate Nausea And Vomiting   Ticlopidine Hcl    Reaction unknown by patient or family   Warfarin Sodium Nausea And Vomiting      Medication List    STOP taking these medications   aspirin 81 MG tablet     TAKE these medications   albuterol 108 (90 Base) MCG/ACT inhaler Commonly known as:  PROVENTIL HFA;VENTOLIN HFA Inhale 2 puffs into the lungs every 6 (six) hours as needed for wheezing or shortness of breath.   atorvastatin 40 MG tablet Commonly known as:  LIPITOR Take 40 mg by mouth daily.   Carboxymethylcellulose Sod PF 0.25 % Soln Place 1 drop into both eyes 4 (four) times daily.   CENTRUM SILVER 50+MEN Tabs Take 1 tablet by mouth daily. What changed:  Another medication with the same name was removed. Continue taking this medication, and follow the directions you see here.   diphenhydramine-acetaminophen 25-500 MG Tabs tablet Commonly known as:  TYLENOL PM Take 1 tablet by mouth at bedtime as needed (for sleep).   doxazosin 8 MG tablet Commonly known as:  CARDURA Take 4 mg by mouth at bedtime.   furosemide 20 MG tablet Commonly known as:  LASIX Take 1 tablet (20 mg total) by mouth daily.   glipiZIDE 5 MG tablet Commonly known as:  GLUCOTROL Take 1 tablet (5 mg total) by mouth 2 (two) times daily before a meal.   guaiFENesin 600 MG 12 hr tablet Commonly known as:  MUCINEX Take 1 tablet (600 mg total) by mouth 2 (two) times daily.   metoprolol 200 MG 24 hr tablet Commonly known as:  TOPROL-XL Take 100 mg by mouth daily.   ONE TOUCH ULTRA TEST test strip Generic drug:  glucose blood USE TO CHECK BLOOD SUGAR 2 TIMES DAILY AS DIRECTED   ONETOUCH DELICA LANCETS 33G Misc    predniSONE 10 MG tablet Commonly known as:  DELTASONE 40mg  daily for 2 days, 30mg  daily for 2 days, 20mg  daily for 2 days, 10mg  daily for 2 days then stop   STIOLTO RESPIMAT 2.5-2.5 MCG/ACT Aers Generic drug:  Tiotropium Bromide-Olodaterol Inhale 2 puffs into the lungs every morning.        Follow-up Information    Jerilee Field, MD Follow up.   Specialty:  Urology Why:  Following discharge, call to set up an appointment for follow-up of your urinary symptoms as well as the blood in your urine Contact information: [address] [place] [address] [phone]        Hannah Beat, MD Follow up in 1 week(s).   Specialty:  Family Medicine Why:  with repeat BMP and CBC Contact information: [address] [place] [address] [phone]        Antonieta Iba, MD Follow up in 2 week(s).   Specialty:  Cardiology Contact information: [address] [place] [address] [phone]  Allergies  Allergen Reactions  . Metformin And Related Nausea And Vomiting  . Tramadol Hcl Other (See Comments)    Tremor vs seizure activity  . Ace Inhibitors Cough  . Atorvastatin Other (See Comments)    Makes the patient "weak"  . Codeine Nausea And Vomiting  . Gabapentin Other (See Comments)    "Messed up my nervous system"  . Morphine Sulfate Nausea And Vomiting  . Ticlopidine Hcl     Reaction unknown by patient or family  . Warfarin Sodium Nausea And Vomiting    Consultations:  Cardiology and urology   Procedures/Studies: Dg Chest 2 View  Result Date: 10/14/2016 CLINICAL DATA:  Shortness of Breath with worsening over the last 2 days. EXAM: CHEST  2 VIEW COMPARISON:  08/29/2016 FINDINGS: Lungs are hyperexpanded. Interstitial markings are diffusely coarsened with chronic features. Nodular right suprahilar density stable since 10/27/2015 and no nodule seen at this location on CT scan of 12/10/2015. No focal airspace  consolidation. No pulmonary edema or pleural effusion. The cardio pericardial silhouette is enlarged. Patient is status post CABG. The visualized bony structures of the thorax are intact. Telemetry leads overlie the chest. IMPRESSION: Cardiomegaly with emphysema.  No acute cardiopulmonary process. Electronically Signed   By: Kennith CenterEric  Mansell M.D.   On: 10/14/2016 17:03   [place] Renal  Result Date: 10/15/2016 CLINICAL DATA:  Gross hematuria. EXAM: RENAL / URINARY TRACT ULTRASOUND COMPLETE COMPARISON:  MRI head 03/29/2012 FINDINGS: Right Kidney: Length: 11.0 cm. No hydronephrosis. Diffuse cortical thinning. Small hypoechoic 12 mm lesion identified in the lower pole region and a second 17 mm lesion is identified in the interpolar region. Left Kidney: Length: 12.2 cm. No hydronephrosis. Three cysts are identified, including a dominant 7 cm lesion towards the lower pole. Bladder: Appears normal for degree of bladder distention. IMPRESSION: 1. The patient is bilateral renal cysts which were also visible on the prior MRI although a particular lesion in the superficial aspect of the interpolar right kidney was not present on the prior MRI. This represents a 17 mm hypoechoic lesion on today's study. Given the history of hematuria, further imaging workup is likely warranted and MRI without and with contrast would be the study of choice to further evaluate. Electronically Signed   By: Kennith CenterEric  Mansell M.D.   On: 10/15/2016 09:13    Echo on 10/16/2016: Study Conclusions  - Left ventricle: The cavity size was normal. Wall thickness was   increased in a pattern of mild LVH. Systolic function was normal.   The estimated ejection fraction was in the range of 60% to 65%.   Wall motion was normal; there were no regional wall motion   abnormalities. Doppler parameters are consistent with abnormal   left ventricular relaxation (grade 1 diastolic dysfunction). - Mitral valve: Calcified annulus. Mildly thickened leaflets . - Left  atrium: The atrium was mildly dilated. - Pulmonary arteries: Systolic pressure was mildly increased. PA   peak pressure: 42 mm Hg (S).  Impressions:  - Normal LV systolic function; mild diastolic dysfunction; mild   LVH; mild LAE; trace TR with mildly elevated pulmonary pressure.   Subjective: Patient seen and examined at bedside. He feels much better and wants to go home. Overnight fever, nausea or vomiting or chest pain.  Discharge Exam: Vitals:   10/17/16 0700 10/17/16 0849  BP:    Pulse:    Resp:  19  Temp: 97.7 F (36.5 C)    Vitals:   10/17/16 0434 10/17/16 0700 10/17/16 0849 10/17/16 [id number]  BP: (!) 163/62     Pulse: 86     Resp: (!) 23  19   Temp: 98 F (36.7 C) 97.7 F (36.5 C)    TempSrc: Oral Oral    SpO2: 95%   95%  Weight: 81.1 kg (178 lb 11.2 oz)     Height:        General: Pt is alert, awake, not in acute distress Cardiovascular: Rate controlled, S1/S2 + Respiratory: Bilateral decreased breath sounds at bases with some scattered crackles and minimal wheezing Abdominal: Soft, NT, ND, bowel sounds + Extremities: Trace pitting pedal edema, no cyanosis    The results of significant diagnostics from this hospitalization (including imaging, microbiology, ancillary and laboratory) are listed below for reference.     Microbiology: Recent Results (from the past 240 hour(s))  Respiratory Panel by PCR     Status: None   Collection Time: 10/15/16  3:23 AM  Result Value Ref Range Status   Adenovirus NOT DETECTED NOT DETECTED Final   Coronavirus 229E NOT DETECTED NOT DETECTED Final   Coronavirus HKU1 NOT DETECTED NOT DETECTED Final   Coronavirus NL63 NOT DETECTED NOT DETECTED Final   Coronavirus OC43 NOT DETECTED NOT DETECTED Final   Metapneumovirus NOT DETECTED NOT DETECTED Final   Rhinovirus / Enterovirus NOT DETECTED NOT DETECTED Final   Influenza A NOT DETECTED NOT DETECTED Final   Influenza B NOT DETECTED NOT DETECTED Final   Parainfluenza Virus 1  NOT DETECTED NOT DETECTED Final   Parainfluenza Virus 2 NOT DETECTED NOT DETECTED Final   Parainfluenza Virus 3 NOT DETECTED NOT DETECTED Final   Parainfluenza Virus 4 NOT DETECTED NOT DETECTED Final   Respiratory Syncytial Virus NOT DETECTED NOT DETECTED Final   Bordetella pertussis NOT DETECTED NOT DETECTED Final   Chlamydophila pneumoniae NOT DETECTED NOT DETECTED Final   Mycoplasma pneumoniae NOT DETECTED NOT DETECTED Final  MRSA PCR Screening     Status: None   Collection Time: 10/15/16  3:23 AM  Result Value Ref Range Status   MRSA by PCR NEGATIVE NEGATIVE Final    Comment:        The GeneXpert MRSA Assay (FDA approved for NASAL specimens only), is one component of a comprehensive MRSA colonization surveillance program. It is not intended to diagnose MRSA infection nor to guide or monitor treatment for MRSA infections.      Labs: BNP (last 3 results)  Recent Labs  08/29/16 1745 10/14/16 1510  BNP 299.5* 360.9*   Basic Metabolic Panel:  Recent Labs Lab 10/14/16 1510 10/15/16 0428 10/16/16 0241 10/17/16 0352  NA 133* 132* 137 136  K 4.9 4.3 3.8 3.9  CL 100* 97* 99* 100*  CO2 22 25 27 27   GLUCOSE 189* 389* 240* 229*  BUN 31* 39* 55* 68*  CREATININE 1.76* 1.81* 1.85* 1.75*  CALCIUM 9.0 9.0 9.1 9.2  MG  --  1.8 2.2 2.2  PHOS  --  4.4  --   --    Liver Function Tests:  Recent Labs Lab 10/14/16 1510 10/15/16 0428 10/16/16 0241  AST 20 16 19   ALT 14* 14* 16*  ALKPHOS 72 67 66  BILITOT 0.9 0.7 0.6  PROT 6.7 6.3* 6.4*  ALBUMIN 3.4* 3.0* 3.1*   No results for input(s): LIPASE, AMYLASE in the last 168 hours. No results for input(s): AMMONIA in the last 168 hours. CBC:  Recent Labs Lab 10/14/16 1510 10/15/16 0428  10/15/16 2333 10/16/16 0241 10/16/16 1149 10/16/16 2303 10/17/16 0352  WBC 11.3*  5.0  --   --  7.4  --   --  9.2  NEUTROABS 8.3*  --   --   --  5.9  --   --  7.6  HGB 12.2* 10.9*  < > 10.8* 11.4* 11.6* 11.0* 11.3*  HCT 39.2 35.4*   < > 33.6* 36.1* 36.5* 34.5* 35.2*  MCV 96.3 96.5  --   --  96.3  --   --  94.9  PLT 166 137*  --   --  181  --   --  183  < > = values in this interval not displayed. Cardiac Enzymes:  Recent Labs Lab 10/14/16 2013 10/15/16 0428 10/15/16 0958 10/15/16 1510  TROPONINI 0.39* 0.18* 0.12* 0.17*   BNP: Invalid input(s): POCBNP CBG:  Recent Labs Lab 10/16/16 1650 10/16/16 2009 10/16/16 2350 10/17/16 0433 10/17/16 0738  GLUCAP 290* 261* 241* 233* 219*   D-Dimer  Recent Labs  10/14/16 2013  DDIMER 1.42*   Hgb A1c  Recent Labs  10/15/16 0428 10/16/16 0241  HGBA1C 8.0* 7.8*   Lipid Profile No results for input(s): CHOL, HDL, LDLCALC, TRIG, CHOLHDL, LDLDIRECT in the last 72 hours. Thyroid function studies  Recent Labs  10/15/16 0428  TSH 0.367   Anemia work up No results for input(s): VITAMINB12, FOLATE, FERRITIN, TIBC, IRON, RETICCTPCT in the last 72 hours. Urinalysis    Component Value Date/Time   COLORURINE YELLOW 10/14/2016 2003   APPEARANCEUR CLEAR 10/14/2016 2003   LABSPEC 1.007 10/14/2016 2003   PHURINE 5.0 10/14/2016 2003   GLUCOSEU >=500 (A) 10/14/2016 2003   HGBUR MODERATE (A) 10/14/2016 2003   BILIRUBINUR NEGATIVE 10/14/2016 2003   KETONESUR NEGATIVE 10/14/2016 2003   PROTEINUR NEGATIVE 10/14/2016 2003   NITRITE NEGATIVE 10/14/2016 2003   LEUKOCYTESUR NEGATIVE 10/14/2016 2003   Sepsis Labs Invalid input(s): PROCALCITONIN,  WBC,  LACTICIDVEN Microbiology Recent Results (from the past 240 hour(s))  Respiratory Panel by PCR     Status: None   Collection Time: 10/15/16  3:23 AM  Result Value Ref Range Status   Adenovirus NOT DETECTED NOT DETECTED Final   Coronavirus 229E NOT DETECTED NOT DETECTED Final   Coronavirus HKU1 NOT DETECTED NOT DETECTED Final   Coronavirus NL63 NOT DETECTED NOT DETECTED Final   Coronavirus OC43 NOT DETECTED NOT DETECTED Final   Metapneumovirus NOT DETECTED NOT DETECTED Final   Rhinovirus / Enterovirus NOT DETECTED  NOT DETECTED Final   Influenza A NOT DETECTED NOT DETECTED Final   Influenza B NOT DETECTED NOT DETECTED Final   Parainfluenza Virus 1 NOT DETECTED NOT DETECTED Final   Parainfluenza Virus 2 NOT DETECTED NOT DETECTED Final   Parainfluenza Virus 3 NOT DETECTED NOT DETECTED Final   Parainfluenza Virus 4 NOT DETECTED NOT DETECTED Final   Respiratory Syncytial Virus NOT DETECTED NOT DETECTED Final   Bordetella pertussis NOT DETECTED NOT DETECTED Final   Chlamydophila pneumoniae NOT DETECTED NOT DETECTED Final   Mycoplasma pneumoniae NOT DETECTED NOT DETECTED Final  MRSA PCR Screening     Status: None   Collection Time: 10/15/16  3:23 AM  Result Value Ref Range Status   MRSA by PCR NEGATIVE NEGATIVE Final    Comment:        The GeneXpert MRSA Assay (FDA approved for NASAL specimens only), is one component of a comprehensive MRSA colonization surveillance program. It is not intended to diagnose MRSA infection nor to guide or monitor treatment for MRSA infections.      Time coordinating discharge: 35 minutes  SIGNED:   Glade Lloyd, MD  [facility] 10/17/2016, 9:35 AM Pager: [phone]  If 7PM-7AM, please contact night-coverage [email or web address] Password [email or web address]

## 2016-10-17 NOTE — Progress Notes (Signed)
Patient given discharge instructions with wife at bedside. Explained new medication and reasoning behind it. Patient stated he understood. Had no further questions. One IV removed per patient. No issues. Patient and wife wheeled out via volunteers.

## 2016-10-18 ENCOUNTER — Telehealth: Payer: Self-pay | Admitting: Cardiovascular Disease

## 2016-10-18 NOTE — Telephone Encounter (Signed)
Patient calling to schedule hospital fu appt.  Patient last seen in 2016 Dr. Mariah MillingGollan  DC [facility] 7/10 for "minor heart failure" per patient needs 2 week fu  Scheduled 7/24 [name] at 940 am.

## 2016-10-18 NOTE — Telephone Encounter (Signed)
[facility].  Patient understands to follow up with provider Dr. Mariah MillingGollan on 10/31/16 at 09:40AM at [facility]. Patient understands discharge instructions? Yes Patient understands medications and regiment? Yes Patient understands to bring all medications to this visit? Yes  Patient reports that his furosemide is not correct. He states that he has been taking 40 mg once daily. Instructed him to bring in his medications so that we can update his list. He verbalized understanding with no further questions at this time.

## 2016-10-19 ENCOUNTER — Telehealth: Payer: Self-pay | Admitting: *Deleted

## 2016-10-19 NOTE — Telephone Encounter (Signed)
Lm requesting return call to complete TCM and confirm hosp f/u appt  

## 2016-10-25 ENCOUNTER — Encounter: Payer: Self-pay | Admitting: Family Medicine

## 2016-10-25 ENCOUNTER — Ambulatory Visit (INDEPENDENT_AMBULATORY_CARE_PROVIDER_SITE_OTHER): Payer: Medicare PPO | Admitting: Family Medicine

## 2016-10-25 ENCOUNTER — Telehealth: Payer: Self-pay | Admitting: Pulmonary Disease

## 2016-10-25 VITALS — BP 120/70 | HR 80 | Temp 97.4°F | Ht 66.0 in | Wt 179.0 lb

## 2016-10-25 DIAGNOSIS — I5032 Chronic diastolic (congestive) heart failure: Secondary | ICD-10-CM

## 2016-10-25 DIAGNOSIS — J441 Chronic obstructive pulmonary disease with (acute) exacerbation: Secondary | ICD-10-CM | POA: Diagnosis not present

## 2016-10-25 DIAGNOSIS — J432 Centrilobular emphysema: Secondary | ICD-10-CM

## 2016-10-25 DIAGNOSIS — N183 Chronic kidney disease, stage 3 unspecified: Secondary | ICD-10-CM

## 2016-10-25 DIAGNOSIS — I25810 Atherosclerosis of coronary artery bypass graft(s) without angina pectoris: Secondary | ICD-10-CM

## 2016-10-25 DIAGNOSIS — R310 Gross hematuria: Secondary | ICD-10-CM | POA: Diagnosis not present

## 2016-10-25 NOTE — Progress Notes (Addendum)
Dr. Karleen Hampshire T. Blythe Hartshorn, MD, CAQ [facility] [address] [place], [address] Phone: [phone] Fax: [phone]  10/25/2016  Patient: Timothy Cordova, MRN: [id number], DOB: 1929/01/01, 83 y.o.  Primary Physician:  Hannah Beat, MD   Chief Complaint  Patient presents with  . Hospitalization Follow-up   Subjective:   Timothy Cordova is a 83 y.o. very pleasant male patient who presents with the following:  Timothy Cordova is not doing all that well globally for multiple organ systems, and he doesn't feel all that great even now after discharge, a days after discharge.  Admit date: 10/14/2016 Discharge date: 10/17/2016  He was admitted with worsening dyspnea and hypoxia and COPD exacerbation, and he additionally had positive troponins.  1.  COPD, followed by pulmonology for many years.  Emphysema.  Compliant with medications. On Stiolto with prn albuterol. He was admitted and given high-dose IV Solu-Medrol and discharged with p.o. Prednisone.  He continues to have some mild difficulty breathing, but this is better compared to when he was in the hospital.  2. Coronary disease, status post CABG, congestive heart failure, positive troponins: Cardiology saw the patient in the hospital.  Troponin elevation felt to be secondary to demand ischemia.  On echocardiogram the patient had an EF of 60-65% with grade 1 diastolic dysfunction.  Inpatient cardiology team increase the patient's Lasix to 40 mg daily.  Cardiac BNP was at 360 at the time of admission.  3.  Chronic kidney disease stage III.  Recently elevated creatinine with increased Lasix prior to admission.  We will follow this and evaluate today.  4. Gross hematuria.  This stopped prior to discharge.  Inpatient team recommended holding aspirin until the patient sees outpatient urology.  Inpatient workup renal ultrasound only.  F/u Urology? Cards already set Pulm: f/u with Dr.  Leonard Schwartz Urology: macroscopic hematuria.   BMP and CBC  Lab Results  Component Value Date   HGBA1C 7.8 (H) 10/16/2016     Past Medical History, Surgical History, Social History, Family History, Problem List, Medications, and Allergies have been reviewed and updated if relevant.  Patient Active Problem List   Diagnosis Date Noted  . CKD (chronic kidney disease) stage 3, GFR 30-59 ml/min 08/18/2010    Priority: High  . DIASTOLIC HEART FAILURE, CHRONIC 08/18/2009    Priority: High  . CAD, AUTOLOGOUS BYPASS GRAFT 06/07/2008    Priority: High  . DM (diabetes mellitus), type 2 with renal complications (HCC) 09/04/2007    Priority: High  . Centrilobular emphysema (HCC) 08/08/2005    Priority: High  . Elevated troponin level 10/14/2016  . Acute respiratory failure (HCC) 10/14/2016  . COPD exacerbation (HCC) 10/14/2016  . Solitary pulmonary nodule 12/10/2015  . Anemia 11/26/2014  . Dyspnea 11/26/2014  . COPD with acute exacerbation (HCC) 05/21/2013  . Aortic calcification, 04/20/2005 CT Chest 04/03/2013  . Sleep apnea   . Tobacco abuse (in remission)   . Chronic low back pain 04/18/2011  . Renal lesion 03/27/2011  . OSTEOARTHRITIS 12/06/2009  . Mixed hyperlipidemia 06/11/2008  . ALLERGIC RHINITIS 01/30/2007  . HEARING LOSS 07/06/2006  . Essential hypertension 07/06/2006  . BENIGN PROSTATIC HYPERTROPHY 07/06/2006    Past Medical History:  Diagnosis Date  . Allergic rhinitis   . Benign prostatic hypertrophy   . Chronic diastolic heart failure (HCC)    8/08 ECHO with EF 55%  . CKD (chronic kidney disease)   . COPD (chronic obstructive pulmonary disease) (HCC)  moderate. followed by Dr.Byrum  . Coronary artery disease    s/p CABG 2006. LHC (1/10): SVG-OM and PLOM, SVG-D, and LIMA-LAD were all patent  . Dementia    (mild)--06/2006  . Diabetes mellitus   . Edema    localized. suspect diastolic CHF + venous insufficiency  . Hearing loss    left >>right  .  History of tobacco abuse   . Hypercholesterolemia   . Hypertension   . Mixed hyperlipidemia   . Osteoarthritis   . Sleep apnea    ?    Past Surgical History:  Procedure Laterality Date  . ANGIOPLASTY  [id number]  . CARDIAC CATHETERIZATION  90s   [place], [place] x1stent  . CARDIAC CATHETERIZATION     [facility];x2 stents  . CARDIAC CATHETERIZATION  04/29/2013  . CORONARY ARTERY BYPASS GRAFT  02/2005  . ROTATOR CUFF REPAIR  2005   left, Gioffre    Social History   Social History  . Marital status: Married    Spouse name: N/A  . Number of children: N/A  . Years of education: N/A   Occupational History  . rubber fabrication Retired    RETIRED   Social History Main Topics  . Smoking status: Former Smoker    Packs/day: 3.00    Years: 60.00    Types: Cigarettes    Quit date: 07/12/2005  . Smokeless tobacco: Never Used  . Alcohol use No  . Drug use: No  . Sexual activity: Not on file   Other Topics Concern  . Not on file   Social History Narrative  . No narrative on file    Family History  Problem Relation Age of Onset  . Heart attack Mother   . Lung cancer Sister     Allergies  Allergen Reactions  . Metformin And Related Nausea And Vomiting  . Tramadol Hcl Other (See Comments)    Tremor vs seizure activity  . Ace Inhibitors Cough  . Atorvastatin Other (See Comments)    Makes the patient "weak"  . Codeine Nausea And Vomiting  . Gabapentin Other (See Comments)    "Messed up my nervous system"  . Morphine Sulfate Nausea And Vomiting  . Ticlopidine Hcl     Reaction unknown by patient or family  . Warfarin Sodium Nausea And Vomiting    Medication list reviewed and updated in full in [facility].   GEN: No acute illnesses, no fevers, chills. GI: No n/v/d, eating normally Pulm: exertional SOB - improved compared to admission Interactive and getting along well at home.  Otherwise, ROS is as per the HPI.  Objective:   BP 120/70   Pulse 80   Temp (!) 97.4  F (36.3 C) (Oral)   Ht 5\' 6"  (1.676 m)   Wt 179 lb (81.2 kg)   SpO2 98%   BMI 28.89 kg/m   GEN: WDWN, NAD, Non-toxic, A & O x 3 HEENT: Atraumatic, Normocephalic. Neck supple. No masses, No LAD. Ears and Nose: No external deformity. CV: RRR, No M/G/R. No JVD. No thrill. No extra heart sounds. PULM: CTA B, scattered rare wheezes, crackles, scattered B rhonchi. No retractions. No resp. distress. No accessory muscle use. EXTR: No c/c/e NEURO Normal gait.  PSYCH: Normally interactive. Conversant. Not depressed or anxious appearing.  Calm demeanor.   Laboratory and Imaging Data:  Assessment and Plan:   COPD exacerbation (HCC) - Plan: Ambulatory referral to Pulmonology  DIASTOLIC HEART FAILURE, CHRONIC  Coronary atherosclerosis of autologous vein bypass graft without angina  Centrilobular emphysema (HCC) - Plan: Ambulatory referral to Pulmonology  CKD (chronic kidney disease) stage 3, GFR 30-59 ml/min - Plan: Basic metabolic panel  Macroscopic hematuria - Plan: Ambulatory referral to Urology, CBC with Differential/Platelet   It appears there was a communication error at the time of discharge.  The patient has been taking Lasix 40 mg from the time of discharge until today.  Dr. [name] FuSkains notes and the discharge summary clearly state that they want him on 20 mg a day, and I agree with this dosage.  I will personally contact the patient or his wife.  COPD is not doing perfectly. 2 recent admissions as well as pneumonia and COPD exacerbations.  Help arrange follow-up with pulmonology.  I really appreciate their help.  Will have to cautiously observe his renal function, especially in light of increased diuretic use.  Nonemergent outpatient hematuria workup can be initiated.  [facility].  Addendum: 10/26/2016 WBC > 20,000 with 84% PMN Cr 1.6 Given risk of superimposed infection, CBC, ongoing pulmonary complaints, sent in Levaquin 500 mg x 10  d  Follow-up: Return in about 2 months (around 12/26/2016).  Future Appointments Date Time Provider Department Center  10/31/2016 9:40 AM [name], Tollie Pizzaimothy J, MD [facility]    Meds ordered this encounter  Medications  . furosemide (LASIX) 40 MG tablet    Sig: Take 20 mg by mouth daily.   Medications Discontinued During This Encounter  Medication Reason  . furosemide (LASIX) 20 MG tablet Change in therapy  . predniSONE (DELTASONE) 10 MG tablet Completed Course   Orders Placed This Encounter  Procedures  . Basic metabolic panel  . CBC with Differential/Platelet  . Ambulatory referral to Pulmonology  . Ambulatory referral to Urology    Signed,  Karleen HampshireSpencer T. Alexanderia Gorby, MD   Allergies as of 10/25/2016      Reactions   Metformin And Related Nausea And Vomiting   Tramadol Hcl Other (See Comments)   Tremor vs seizure activity   Ace Inhibitors Cough   Atorvastatin Other (See Comments)   Makes the patient "weak"   Codeine Nausea And Vomiting   Gabapentin Other (See Comments)   "Messed up my nervous system"   Morphine Sulfate Nausea And Vomiting   Ticlopidine Hcl    Reaction unknown by patient or family   Warfarin Sodium Nausea And Vomiting      Medication List       Accurate as of 10/25/16  6:17 PM. Always use your most recent med list.          albuterol 108 (90 Base) MCG/ACT inhaler Commonly known as:  PROVENTIL HFA;VENTOLIN HFA Inhale 2 puffs into the lungs every 6 (six) hours as needed for wheezing or shortness of breath.   atorvastatin 40 MG tablet Commonly known as:  LIPITOR Take 40 mg by mouth daily.   Carboxymethylcellulose Sod PF 0.25 % Soln Place 1 drop into both eyes 4 (four) times daily.   CENTRUM SILVER 50+MEN Tabs Take 1 tablet by mouth daily.   diphenhydramine-acetaminophen 25-500 MG Tabs tablet Commonly known as:  TYLENOL PM Take 1 tablet by mouth at bedtime as needed (for sleep).   doxazosin 8 MG tablet Commonly known as:   CARDURA Take 4 mg by mouth at bedtime.   furosemide 40 MG tablet Commonly known as:  LASIX Take 20 mg by mouth daily.   glipiZIDE 5 MG tablet Commonly known as:  GLUCOTROL Take 1 tablet (5 mg  total) by mouth 2 (two) times daily before a meal.   guaiFENesin 600 MG 12 hr tablet Commonly known as:  MUCINEX Take 1 tablet (600 mg total) by mouth 2 (two) times daily.   metoprolol 200 MG 24 hr tablet Commonly known as:  TOPROL-XL Take 100 mg by mouth daily.   ONE TOUCH ULTRA TEST test strip Generic drug:  glucose blood USE TO CHECK BLOOD SUGAR 2 TIMES DAILY AS DIRECTED   ONETOUCH DELICA LANCETS 33G Misc   STIOLTO RESPIMAT 2.5-2.5 MCG/ACT Aers Generic drug:  Tiotropium Bromide-Olodaterol Inhale 2 puffs into the lungs every morning.

## 2016-10-25 NOTE — Telephone Encounter (Signed)
Callback number listed is the patient's callback number, not Dr. Cyndie Chimeopland's office number. Called pt on both home and mobile number listed, both numbers rang to fast busy signal.   ATC Dr. Cyndie Chimeopland's office, was on hold over 8 minutes.   We have no sooner availability for a HFU. Soonest appt is for 11/15/16 with Brandi.

## 2016-10-25 NOTE — Patient Instructions (Signed)
Cooking With Less Salt Cooking with less salt is one way to reduce the amount of sodium you get from food. Depending on your condition and overall health, your health care provider or diet and nutrition specialist (dietitian) may recommend that you reduce your sodium intake. Most people should have less than 2,300 milligrams (mg) of sodium each day. If you have high blood pressure (hypertension), you may need to limit your sodium to 1,500 mg each day. Follow the tips below to help reduce your sodium intake. What do I need to know about cooking with less salt? Shopping  Buy sodium-free or low-sodium products. Look for the following words on food labels: ? Low-sodium. ? Sodium-free. ? Reduced-sodium. ? No salt added. ? Unsalted.  Buy fresh or frozen vegetables. Avoid canned vegetables.  Avoid buying meats or protein foods that have been injected with broth or saline solution.  Avoid cured or smoked meats, such as hot dogs, bacon, salami, ham, and bologna. Reading food labels  Check the food label before buying or using packaged ingredients.  Look for products with no more than 140 mg of sodium in one serving.  Do not choose foods with salt as one of the first three ingredients on the ingredients list. If salt is one of the first three ingredients, it usually means the item is high in sodium, because ingredients are listed in order of amount in the food item. Cooking  Use herbs, seasonings without salt, and spices as substitutes for salt in foods.  Use sodium-free baking soda when baking.  Grill, braise, or roast foods to add flavor with less salt.  Avoid adding salt to pasta, rice, or hot cereals while cooking.  Drain and rinse canned vegetables before use.  Avoid adding salt when cooking sweets and desserts.  Cook with low-sodium ingredients. What are some salt alternatives? The following are herbs, seasonings, and spices that can be used instead of salt to give taste to your  food. Herbs should be fresh or dried. Do not choose packaged mixes. Next to the name of the herb, spice, or seasoning are some examples of foods you can pair it with. Herbs  Bay leaves - Soups, meat and vegetable dishes, and spaghetti sauce.  Basil - [employer], soups, pasta, and fish dishes.  Cilantro - Meat, poultry, and vegetable dishes.  Chili powder - Marinades and Mexican dishes.  Chives - Salad dressings and potato dishes.  Cumin - Mexican dishes, couscous, and meat dishes.  Dill - Fish dishes, sauces, and salads.  Fennel - Meat and vegetable dishes, breads, and cookies.  Garlic (do not use garlic salt) - [place] dishes, meat dishes, salad dressings, and sauces.  Marjoram - Soups, potato dishes, and meat dishes.  Oregano - Pizza and spaghetti sauce.  Parsley - Salads, soups, pasta, and meat dishes.  Rosemary - [place] dishes, salad dressings, soups, and red meats.  Saffron - Fish dishes, pasta, and some poultry dishes.  Sage - Stuffings and sauces.  Tarragon - Fish and [employer].  Thyme - Stuffing, meat, and fish dishes. Seasonings  Lemon juice - Fish dishes, poultry dishes, vegetables, and salads.  Vinegar - Salad dressings, vegetables, and fish dishes. Spices  Cinnamon - Sweet dishes, such as cakes, cookies, and puddings.  Cloves - Gingerbread, puddings, and marinades for meats.  Curry - Vegetable dishes, fish and poultry dishes, and stir-fry dishes.  Ginger - Vegetables dishes, fish dishes, and stir-fry dishes.  Nutmeg - Pasta, vegetables, poultry, fish dishes, and custard. What  are some low-sodium ingredients and foods?  Fresh or frozen fruits and vegetables with no sauce added.  Fresh or frozen whole meats, poultry, and fish with no sauce added.  Eggs.  Noodles, pasta, quinoa, rice.  Shredded or puffed wheat or puffed rice.  Regular or quick oats.  Milk, yogurt, hard cheeses, and low-sodium cheeses. Good cheese choices include  Swiss, [employer], and [address]. Always check the label for the serving size and sodium content.  Unsalted butter or margarine.  Unsalted nuts.  Sherbet or ice cream (keep to  cup per serving).  Homemade pudding.  Sodium-free baking soda and baking powder. This is not a complete list of low-sodium ingredients and foods. Contact your dietitian for more options. Summary  Cooking with less salt is one way to reduce the amount of sodium that you get from food.  Buy sodium-free or low-sodium products.  Check the food label before using or buying packaged ingredients.  Use herbs, seasonings without salt, and spices as substitutes for salt in foods. This information is not intended to replace advice given to you by your health care provider. Make sure you discuss any questions you have with your health care provider. Document Released: 03/27/2005 Document Revised: 04/04/2016 Document Reviewed: 04/04/2016 Elsevier Interactive Patient Education  2017 Elsevier Inc.    Heart-Healthy Eating Plan Many factors influence your heart health, including eating and exercise habits. Heart (coronary) risk increases with abnormal blood fat (lipid) levels. Heart-healthy meal planning includes limiting unhealthy fats, increasing healthy fats, and making other small dietary changes. This includes maintaining a healthy body weight to help keep lipid levels within a normal range. What is my plan? Your health care provider recommends that you:  Get no more than _________% of the total calories in your daily diet from fat.  Limit your intake of saturated fat to less than _________% of your total calories each day.  Limit the amount of cholesterol in your diet to less than _________ mg per day.  What types of fat should I choose?  Choose healthy fats more often. Choose monounsaturated and polyunsaturated fats, such as olive oil and canola oil, flaxseeds, walnuts, almonds, and seeds.  Eat more  omega-3 fats. Good choices include salmon, mackerel, sardines, tuna, flaxseed oil, and ground flaxseeds. Aim to eat fish at least two times each week.  Limit saturated fats. Saturated fats are primarily found in animal products, such as meats, butter, and cream. Plant sources of saturated fats include palm oil, palm kernel oil, and coconut oil.  Avoid foods with partially hydrogenated oils in them. These contain trans fats. Examples of foods that contain trans fats are stick margarine, some tub margarines, cookies, crackers, and other baked goods. What general guidelines do I need to follow?  Check food labels carefully to identify foods with trans fats or high amounts of saturated fat.  Fill one half of your plate with vegetables and green salads. Eat 4-5 servings of vegetables per day. A serving of vegetables equals 1 cup of raw leafy vegetables,  cup of raw or cooked cut-up vegetables, or  cup of vegetable juice.  Fill one fourth of your plate with whole grains. Look for the word "whole" as the first word in the ingredient list.  Fill one fourth of your plate with lean protein foods.  Eat 4-5 servings of fruit per day. A serving of fruit equals one medium whole fruit,  cup of dried fruit,  cup of fresh, frozen, or canned fruit, or  cup  of 100% fruit juice.  Eat more foods that contain soluble fiber. Examples of foods that contain this type of fiber are apples, broccoli, carrots, beans, peas, and barley. Aim to get 20-30 g of fiber per day.  Eat more home-cooked food and less restaurant, buffet, and fast food.  Limit or avoid alcohol.  Limit foods that are high in starch and sugar.  Avoid fried foods.  Cook foods by using methods other than frying. Baking, boiling, grilling, and broiling are all great options. Other fat-reducing suggestions include: ? Removing the skin from poultry. ? Removing all visible fats from meats. ? Skimming the fat off of stews, soups, and gravies before  serving them. ? Steaming vegetables in water or broth.  Lose weight if you are overweight. Losing just 5-10% of your initial body weight can help your overall health and prevent diseases such as diabetes and heart disease.  Increase your consumption of nuts, legumes, and seeds to 4-5 servings per week. One serving of dried beans or legumes equals  cup after being cooked, one serving of nuts equals 1 ounces, and one serving of seeds equals  ounce or 1 tablespoon.  You may need to monitor your salt (sodium) intake, especially if you have high blood pressure. Talk with your health care provider or dietitian to get more information about reducing sodium. What foods can I eat? Grains  Breads, including [place], white, pita, wheat, raisin, rye, oatmeal, and [place]. Tortillas that are neither fried nor made with lard or trans fat. Low-fat rolls, including hotdog and hamburger buns and English muffins. Biscuits. Muffins. Waffles. Pancakes. Light popcorn. Whole-grain cereals. Flatbread. Melba toast. Pretzels. Breadsticks. Rusks. Low-fat snacks and crackers, including oyster, saltine, matzo, graham, animal, and rye. Rice and pasta, including brown rice and those that are made with whole wheat. Vegetables All vegetables. Fruits All fruits, but limit coconut. Meats and Other Protein Sources Lean, well-trimmed beef, veal, pork, and lamb. Chicken and [place] without skin. All fish and shellfish. Wild duck, rabbit, pheasant, and venison. Egg whites or low-cholesterol egg substitutes. Dried beans, peas, lentils, and tofu.Seeds and most nuts. Dairy Low-fat or nonfat cheeses, including ricotta, string, and mozzarella. Skim or 1% milk that is liquid, powdered, or evaporated. Buttermilk that is made with low-fat milk. Nonfat or low-fat yogurt. Beverages Mineral water. Diet carbonated beverages. Sweets and Desserts Sherbets and fruit ices. Honey, jam, marmalade, jelly, and syrups. Meringues and gelatins. Pure  sugar candy, such as hard candy, jelly beans, gumdrops, mints, marshmallows, and small amounts of dark chocolate. [employer]. Eat all sweets and desserts in moderation. Fats and Oils Nonhydrogenated (trans-free) margarines. Vegetable oils, including soybean, sesame, sunflower, olive, peanut, safflower, corn, canola, and cottonseed. Salad dressings or mayonnaise that are made with a vegetable oil. Limit added fats and oils that you use for cooking, baking, salads, and as spreads. Other Cocoa powder. Coffee and tea. All seasonings and condiments. The items listed above may not be a complete list of recommended foods or beverages. Contact your dietitian for more options. What foods are not recommended? Grains Breads that are made with saturated or trans fats, oils, or whole milk. Croissants. Butter rolls. Cheese breads. Sweet rolls. Donuts. Buttered popcorn. Chow mein noodles. High-fat crackers, such as cheese or butter crackers. Meats and Other Protein Sources Fatty meats, such as hotdogs, short ribs, sausage, spareribs, bacon, ribeye roast or steak, and mutton. High-fat deli meats, such as salami and bologna. Caviar. Domestic duck and goose. Organ meats, such as kidney, liver,  sweetbreads, brains, gizzard, chitterlings, and heart. Dairy Cream, sour cream, cream cheese, and creamed cottage cheese. Whole milk cheeses, including blue (bleu), [address], [place]rie, [place], [address], [place], [address], [place], [place]ember[place], and [place]. Whole or 2% milk that is liquid, evaporated, or condensed. Whole buttermilk. Cream sauce or high-fat cheese sauce. Yogurt that is made from whole milk. Beverages Regular sodas and drinks with added sugar. Sweets and Desserts Frosting. Pudding. Cookies. Cakes other than angel food cake. Candy that has milk chocolate or white chocolate, hydrogenated fat, butter, coconut, or unknown ingredients. Buttered syrups. Full-fat ice cream or ice cream drinks. Fats and Oils Gravy that  has suet, meat fat, or shortening. Cocoa butter, hydrogenated oils, palm oil, coconut oil, palm kernel oil. These can often be found in baked products, candy, fried foods, nondairy creamers, and whipped toppings. Solid fats and shortenings, including bacon fat, salt pork, lard, and butter. Nondairy cream substitutes, such as coffee creamers and sour cream substitutes. Salad dressings that are made of unknown oils, cheese, or sour cream. The items listed above may not be a complete list of foods and beverages to avoid. Contact your dietitian for more information. This information is not intended to replace advice given to you by your health care provider. Make sure you discuss any questions you have with your health care provider. Document Released: 01/04/2008 Document Revised: 10/15/2015 Document Reviewed: 09/18/2013 Elsevier Interactive Patient Education  2017 [employer]

## 2016-10-26 ENCOUNTER — Telehealth: Payer: Self-pay | Admitting: Radiology

## 2016-10-26 LAB — CBC WITH DIFFERENTIAL/PLATELET
Basophils Absolute: 0.2 10*3/uL — ABNORMAL HIGH (ref 0.0–0.1)
Basophils Relative: 0.8 % (ref 0.0–3.0)
EOS PCT: 0.3 % (ref 0.0–5.0)
Eosinophils Absolute: 0.1 10*3/uL (ref 0.0–0.7)
HCT: 41.9 % (ref 39.0–52.0)
HEMOGLOBIN: 13.1 g/dL (ref 13.0–17.0)
Lymphocytes Relative: 8.4 % — ABNORMAL LOW (ref 12.0–46.0)
Lymphs Abs: 1.7 10*3/uL (ref 0.7–4.0)
MCHC: 31.3 g/dL (ref 30.0–36.0)
MCV: 96.2 fl (ref 78.0–100.0)
MONOS PCT: 6.3 % (ref 3.0–12.0)
Monocytes Absolute: 1.3 10*3/uL — ABNORMAL HIGH (ref 0.1–1.0)
Neutro Abs: 17.3 10*3/uL — ABNORMAL HIGH (ref 1.4–7.7)
Neutrophils Relative %: 84.2 % — ABNORMAL HIGH (ref 43.0–77.0)
Platelets: 274 10*3/uL (ref 150.0–400.0)
RBC: 4.35 Mil/uL (ref 4.22–5.81)
RDW: 14.7 % (ref 11.5–15.5)

## 2016-10-26 LAB — BASIC METABOLIC PANEL
BUN: 49 mg/dL — ABNORMAL HIGH (ref 6–23)
CO2: 30 mEq/L (ref 19–32)
Calcium: 9.9 mg/dL (ref 8.4–10.5)
Chloride: 99 mEq/L (ref 96–112)
Creatinine, Ser: 1.67 mg/dL — ABNORMAL HIGH (ref 0.40–1.50)
GFR: 41.62 mL/min — AB (ref >60.00)
Glucose, Bld: 141 mg/dL — ABNORMAL HIGH (ref 70–99)
Potassium: 5.2 mEq/L — ABNORMAL HIGH (ref 3.5–5.1)
SODIUM: 137 meq/L (ref 135–145)

## 2016-10-26 MED ORDER — LEVOFLOXACIN 500 MG PO TABS: 500.0000 mg | ORAL_TABLET | Freq: Every day | ORAL | 0 refills | Status: DC

## 2016-10-26 NOTE — Telephone Encounter (Signed)
lmtcb x1 for pt. 

## 2016-10-26 NOTE — Telephone Encounter (Signed)
See PN

## 2016-10-26 NOTE — Telephone Encounter (Signed)
[facility] called a critical WBC - 20.5. Results given to Dr Patsy Lageropland.

## 2016-10-26 NOTE — Addendum Note (Signed)
Addended by: Hannah BeatOPLAND, Skylie Hiott on: 10/26/2016 01:16 PM   Modules accepted: Orders

## 2016-10-27 NOTE — Telephone Encounter (Signed)
Nothing further is needed. 

## 2016-10-27 NOTE — Telephone Encounter (Signed)
Dr. Cyndie Chimeopland's office called to request hospital follow up with Brandi. Per message I scheduled hospital follow up on 11/15/16 at 9:45.

## 2016-10-29 NOTE — Progress Notes (Signed)
Cardiology Office Note  Date:  10/31/2016   ID:  ORTON CAPELL, DOB 1928-09-17, MRN [id number]  PCP:  Hannah Beat, MD   Chief Complaint  Patient presents with  . other     Follow up [facility] 10/17/2016. Patient c/o SOB and Chest pain. Meds reviewed vebally with patient.     HPI:  83 yo with history of  CAD CORONARY ARTERY DISEASE: s/p CABG 2006.  LHC (1/10): SVG-RCA, SVG-OM and PLOM, SVG-D, and LIMA-LAD were all patent.   diastolic CHF,  severe COPD with 50+ year smoking history ,  diabetes ,   poorly controlled Mild chronic renal dysfunction likely from overdiuresis chronic shortness of breath.  Catheterization January 2015 showing stable patent bypass grafts including LIMA to the LAD, vein graft to the PDA, vein graft to the OM and SVG to diagonal. Patent left subclavian artery stent. presenting for follow-up of his coronary artery disease  Last seen in the clinic March 2016,  over 2 years ago Was following poor diet, fast food  In the hospital x 2 Hospital records reviewed with the patient in detail D/c on 7/10 Admitted to the hospital July 2018 with shortness of breath on exertion for several days, hypoxia hematuria, COPD exacerbation, elevated troponins demand ischemia Ejection fraction 60-65%  Also d/c from hospital in MAY 2018 Hospital records reviewed with the patient in detail PNA ABX x 2, 91 % sats, steroids  Lab work reviewed with him in detail today CR 1.67, BUN 49 Hemoglobin A1c 7.8 hemoglobin A1c 10.3 at the end of 2015  No regular exercise. Denies any symptoms concerning for angina  chronic shortness of breath He did see pulmonary at the [place], Dr. Mick Sell.  He uses inhalers Reports he is scheduled to see pulmonary in the next week or so Denies any leg swelling, PND or orthopnea  Trend of his weight at he brings in today shows weight July 4 was 181 pounds, now down to 171 pounds on today's visit  taking Lasix 20 daily   EKG on today's visit  shows normal sinus rhythm with rate 73 bpm, right bundle branch block, left axis deviation/left anterior fascicular block, PVCs noted  Other past medical history  hospital admission from January 18 to 04/29/2013 for shortness of breath. renal function and diuretics were held.  started on prednisone, antibiotics with significant improvement of his symptoms.[name] symptoms recurred  Echocardiogram and cardiac catheterization in the hospital suggested normal right ventricular systolic pressures.  He is requesting additional prednisone for his symptoms  Previous anginal symptoms and 2006 included chest pain in his sternal area with profound weakness.    No claudication-type pain in his legs. Weight has been relatively stable    PMH:   has a past medical history of Allergic rhinitis; Benign prostatic hypertrophy; Chronic diastolic heart failure (HCC); CKD (chronic kidney disease); COPD (chronic obstructive pulmonary disease) (HCC); Coronary artery disease; Dementia; Diabetes mellitus; Edema; Hearing loss; History of tobacco abuse; Hypercholesterolemia; Hypertension; Mixed hyperlipidemia; Osteoarthritis; and Sleep apnea.  PSH:    Past Surgical History:  Procedure Laterality Date  . ANGIOPLASTY  [id number]  . CARDIAC CATHETERIZATION  90s   [place], [place] x1stent  . CARDIAC CATHETERIZATION     [facility];x2 stents  . CARDIAC CATHETERIZATION  04/29/2013  . CORONARY ARTERY BYPASS GRAFT  02/2005  . ROTATOR CUFF REPAIR  2005   left, [place]    Current Outpatient Prescriptions  Medication Sig Dispense Refill  . albuterol (PROVENTIL HFA;VENTOLIN HFA) 108 (90 Base) MCG/ACT  inhaler Inhale 2 puffs into the lungs every 6 (six) hours as needed for wheezing or shortness of breath. 2 Inhaler 0  . atorvastatin (LIPITOR) 40 MG tablet Take 40 mg by mouth daily.    . Carboxymethylcellulose Sod PF 0.25 % SOLN Place 1 drop into both eyes 4 (four) times daily.     . diphenhydramine-acetaminophen (TYLENOL PM) 25-500 MG  TABS tablet Take 1 tablet by mouth at bedtime as needed (for sleep).     [name] doxazosin (CARDURA) 8 MG tablet Take 4 mg by mouth at bedtime.     . furosemide (LASIX) 40 MG tablet Take 20 mg by mouth daily.    [name] glipiZIDE (GLUCOTROL) 5 MG tablet Take 1 tablet (5 mg total) by mouth 2 (two) times daily before a meal. 180 tablet 3  . guaiFENesin (MUCINEX) 600 MG 12 hr tablet Take 1 tablet (600 mg total) by mouth 2 (two) times daily. 20 tablet 0  . levofloxacin (LEVAQUIN) 500 MG tablet Take 1 tablet (500 mg total) by mouth daily. 10 tablet 0  . metoprolol (TOPROL-XL) 200 MG 24 hr tablet Take 100 mg by mouth daily.    . Multiple Vitamins-Minerals (CENTRUM SILVER 50+MEN) TABS Take 1 tablet by mouth daily.    . ONE TOUCH ULTRA TEST test strip USE TO CHECK BLOOD SUGAR 2 TIMES DAILY AS DIRECTED 50 each 11  . ONETOUCH DELICA LANCETS 33G MISC     . Tiotropium Bromide-Olodaterol (STIOLTO RESPIMAT) 2.5-2.5 MCG/ACT AERS Inhale 2 puffs into the lungs every morning.      No current facility-administered medications for this visit.      Allergies:   Metformin and related; Tramadol hcl; Ace inhibitors; Atorvastatin; Codeine; Gabapentin; Morphine sulfate; Ticlopidine hcl; and Warfarin sodium   Social History:  The patient  reports that he quit smoking about 11 years ago. His smoking use included Cigarettes. He has a 180.00 pack-year smoking history. He has never used smokeless tobacco. He reports that he does not drink alcohol or use drugs.   Family History:   family history includes Heart attack in his mother; Lung cancer in his sister.    Review of Systems: Review of Systems  Constitutional: Negative.   Respiratory: Positive for shortness of breath.   Cardiovascular: Negative.   Gastrointestinal: Negative.   Musculoskeletal: Negative.   Neurological: Negative.   Psychiatric/Behavioral: Negative.   All other systems reviewed and are negative.    PHYSICAL EXAM: VS:  BP (!) 130/58 (BP Location: Left  Arm, Patient Position: Sitting, Cuff Size: Normal)   Pulse 73   Ht 5\' 7"  (1.702 m)   Wt 177 lb 4 oz (80.4 kg)   BMI 27.76 kg/m  , BMI Body mass index is 27.76 kg/m. GEN: Well nourished, well developed, in no acute distress  HEENT: normal  Neck: no JVD, carotid bruits, or masses Cardiac: RRR; no murmurs, rubs, or gallops,no edema  Respiratory: Moderately decreased breath sounds throughout, scant rales, normal work of breathing GI: soft, nontender, nondistended, + BS MS: no deformity or atrophy  Skin: warm and dry, no rash Neuro:  Strength and sensation are intact Psych: euthymic mood, full affect    Recent Labs: 10/14/2016: B Natriuretic Peptide 360.9 10/15/2016: TSH 0.367 10/16/2016: ALT 16 10/17/2016: Magnesium 2.2 10/25/2016: BUN 49; Creatinine, Ser 1.67; Hemoglobin 13.1; Platelets 274.0; Potassium 5.2; Sodium 137    Lipid Panel Lab Results  Component Value Date   CHOL 139 06/26/2016   HDL 28.10 (L) 06/26/2016   LDLCALC 78 06/26/2016  TRIG 166.0 (H) 06/26/2016      Wt Readings from Last 3 Encounters:  10/31/16 177 lb 4 oz (80.4 kg)  10/25/16 179 lb (81.2 kg)  10/17/16 178 lb 11.2 oz (81.1 kg)       ASSESSMENT AND PLAN:  Atherosclerosis of autologous vein coronary artery bypass graft with stable angina pectoris (HCC) - Plan: EKG 12-Lead Currently with no symptoms of angina. No further workup at this time. Continue current medication regimen. Normal ejection fraction on recent echocardiogram  Mixed hyperlipidemia - Plan: EKG 12-Lead Cholesterol is at goal on the current lipid regimen. No changes to the medications were made.  Essential hypertension - Plan: EKG 12-Lead Blood pressure is well controlled on today's visit. No changes made to the medications.  Type 2 diabetes mellitus with diabetic nephropathy, without long-term current use of insulin (HCC) - Plan: EKG 12-Lead Recently changed his diet, no longer eating fast food Numbers should improve  DIASTOLIC  HEART FAILURE, CHRONIC - Plan: EKG 12-Lead It would appear recent hospitalizations were more secondary to pulmonary etiology then diastolic CHF. Appears prerenal on lab work, minimal Lasix required for maintenance  Centrilobular emphysema (HCC) - Plan: EKG 12-Lead Major issues on prednisone, antibiotics and is to most recent hospital visits Severe COPD, strongly recommended he reestablish with pulmonary  CKD (chronic kidney disease) stage 3, GFR 30-59 ml/min - Plan: EKG 12-Lead Creatinine 1.67, will avoid higher dose Lasix  Acute respiratory failure with hypoxia (HCC) - Plan: EKG 12-Lead Recent hospitalizations reviewed in detail  Disposition:   F/U  6 months   Total encounter time more than 45 minutes  Greater than 50% was spent in counseling and coordination of care with the patient    Orders Placed This Encounter  Procedures  . EKG 12-Lead     Signed, Dossie Arbourim [name], M.D., Ph.D. 10/31/2016  [facility] [place] [phone]

## 2016-10-31 ENCOUNTER — Ambulatory Visit (INDEPENDENT_AMBULATORY_CARE_PROVIDER_SITE_OTHER): Payer: Medicare PPO | Admitting: Cardiovascular Disease

## 2016-10-31 ENCOUNTER — Encounter: Payer: Self-pay | Admitting: Cardiovascular Disease

## 2016-10-31 VITALS — BP 130/58 | HR 73 | Ht 67.0 in | Wt 177.2 lb

## 2016-10-31 DIAGNOSIS — I5032 Chronic diastolic (congestive) heart failure: Secondary | ICD-10-CM | POA: Diagnosis not present

## 2016-10-31 DIAGNOSIS — I25718 Atherosclerosis of autologous vein coronary artery bypass graft(s) with other forms of angina pectoris: Secondary | ICD-10-CM | POA: Diagnosis not present

## 2016-10-31 DIAGNOSIS — J432 Centrilobular emphysema: Secondary | ICD-10-CM | POA: Diagnosis not present

## 2016-10-31 DIAGNOSIS — E1121 Type 2 diabetes mellitus with diabetic nephropathy: Secondary | ICD-10-CM

## 2016-10-31 DIAGNOSIS — E782 Mixed hyperlipidemia: Secondary | ICD-10-CM

## 2016-10-31 DIAGNOSIS — N183 Chronic kidney disease, stage 3 unspecified: Secondary | ICD-10-CM

## 2016-10-31 DIAGNOSIS — I10 Essential (primary) hypertension: Secondary | ICD-10-CM | POA: Diagnosis not present

## 2016-10-31 DIAGNOSIS — J9601 Acute respiratory failure with hypoxia: Secondary | ICD-10-CM

## 2016-10-31 NOTE — Patient Instructions (Signed)

## 2016-11-15 ENCOUNTER — Encounter: Payer: Self-pay | Admitting: Pulmonary Disease

## 2016-11-15 ENCOUNTER — Ambulatory Visit (INDEPENDENT_AMBULATORY_CARE_PROVIDER_SITE_OTHER): Payer: Medicare PPO | Admitting: Pulmonary Disease

## 2016-11-15 VITALS — BP 120/70 | HR 77 | Ht 68.0 in | Wt 177.6 lb

## 2016-11-15 DIAGNOSIS — Z720 Tobacco use: Secondary | ICD-10-CM | POA: Diagnosis not present

## 2016-11-15 DIAGNOSIS — J432 Centrilobular emphysema: Secondary | ICD-10-CM | POA: Diagnosis not present

## 2016-11-15 DIAGNOSIS — I5032 Chronic diastolic (congestive) heart failure: Secondary | ICD-10-CM

## 2016-11-15 DIAGNOSIS — R0602 Shortness of breath: Secondary | ICD-10-CM

## 2016-11-15 NOTE — Patient Instructions (Addendum)
1.  So glad to hear you are feeling better  2.  Continue your Stiolto as prescribed 3.  [facility] 4.  Weigh yourself daily and track your weight on a piece of paper. If you notice 3lb weight gain in 2 days or 5lbs in one week > please call your Cardiologist to be seen.  5.  Consider letting us evaluate you for oxygen needs.   6.  Please call if new or worsening symptoms 7.. Continue a low salt diet

## 2016-11-15 NOTE — Progress Notes (Signed)
[place] [facility]   Chief Complaint  Patient presents with  . Hospitalization Follow-up    Pt was in the hospital 08/29/16 as well as 10/09/16 due to dyspnea and heart failure. Pt states he has been doing better since released from the hospital. Pt does have SOB all the time. Denies any cough or CP.     Primary Pulmonologist: Dr. Kendrick FriesMcQuaid   Current Outpatient Prescriptions on File Prior to Visit  Medication Sig  . albuterol (PROVENTIL HFA;VENTOLIN HFA) 108 (90 Base) MCG/ACT inhaler Inhale 2 puffs into the lungs every 6 (six) hours as needed for wheezing or shortness of breath.  [name]. atorvastatin (LIPITOR) 40 MG tablet Take 40 mg by mouth daily.  . Carboxymethylcellulose Sod PF 0.25 % SOLN Place 1 drop into both eyes 4 (four) times daily.   . diphenhydramine-acetaminophen (TYLENOL PM) 25-500 MG TABS tablet Take 1 tablet by mouth at bedtime as needed (for sleep).   [name]. doxazosin (CARDURA) 8 MG tablet Take 4 mg by mouth at bedtime.   . furosemide (LASIX) 40 MG tablet Take 20 mg by mouth daily.  [name]. glipiZIDE (GLUCOTROL) 5 MG tablet Take 1 tablet (5 mg total) by mouth 2 (two) times daily before a meal.  . guaiFENesin (MUCINEX) 600 MG 12 hr tablet Take 1 tablet (600 mg total) by mouth 2 (two) times daily.  [name]. levofloxacin (LEVAQUIN) 500 MG tablet Take 1 tablet (500 mg total) by mouth daily.  . metoprolol (TOPROL-XL) 200 MG 24 hr tablet Take 100 mg by mouth daily.  . Multiple Vitamins-Minerals (CENTRUM SILVER 50+MEN) TABS Take 1 tablet by mouth daily.  . ONE TOUCH ULTRA TEST test strip USE TO CHECK BLOOD SUGAR 2 TIMES DAILY AS DIRECTED  . ONETOUCH DELICA LANCETS 33G MISC   . Tiotropium Bromide-Olodaterol (STIOLTO RESPIMAT) 2.5-2.5 MCG/ACT AERS Inhale 2 puffs into the lungs every morning.    No current facility-administered medications on file prior to visit.      Studies: August 2016 pulmonary function testing clear airflow obstruction with ratio 57%, FEV1 1.06 L (37% predicted) August 2016 high  resolution CT chest: No evidence of pulmonary fibrosis, moderate to severe emphysema noted 12/2015 CT > resolved pneumonia, nodule stable, persistent emphysema  Past Medical Hx:  has a past medical history of Allergic rhinitis; Benign prostatic hypertrophy; Chronic diastolic heart failure (HCC); CKD (chronic kidney disease); COPD (chronic obstructive pulmonary disease) (HCC); Coronary artery disease; Dementia; Diabetes mellitus; Edema; Hearing loss; History of tobacco abuse; Hypercholesterolemia; Hypertension; Mixed hyperlipidemia; Osteoarthritis; and Sleep apnea.   Past Surgical hx, Allergies, Family hx, Social hx all reviewed.  Vital Signs BP 120/70 (BP Location: Left Arm, Patient Position: Sitting, Cuff Size: Normal)   Pulse 77   Ht 5\' 8"  (1.727 m)   Wt 177 lb 9.6 oz (80.6 kg)   SpO2 96%   BMI 27.00 kg/m   History of Present Illness Timothy Cordova is a 83 y.o. male, former heavy smoker, [place] with a history of CKD, chronic diastolic CHF, COPD (not on O2) / centrilobular emphysema, solitary pulmonary nodule (LLL,stable as of 12/2015), OSA (not on CPAP) who presented to the pulmonary office for hospital follow up.   The patient was admitted from 7/7-7/10 for possible COPD exacerbation.  He was not seen by PCCM in the hospital.  He was treated with IV steroids and transitioned to prednisone taper at discharge.  Hospital course notable for elevated troponin which was thought to be demand ischemia.  He was seen by Cardiology.  Pt reports feeling better overall than he has in a long time.  He feels he has changed his eating habits > eating a heart healthy diet and watching his sodium intake.  Pt reports he has been wearing his compression stockings 3-4 days per week.  SOB - none at rest, any activity he becomes dyspneic.  He is able to bathe himself, drives, and cares for his wife who had breast cancer.  Son lives with his father & he just had hip replacement surgery and pt is  caring for him as well.    Pt reports he still sees Dr. Shelle Iron at the [facility] for primary pulmonary.  He follows up with Dr. Shelle Iron in November.   Quit smoking > 10 years ago, smoked 60+ years, up to 3ppd at [employer] for eggs > he has chairs in the yard to rest periodically.  Reports he sits in his chair.  Pt refuses to let me check for O2.  States O2 drops to 80 at home. "I am 83 years old, I don't want to wear that stuff and I have too many people to care for".   Physical Exam  General - well developed adult M in no acute distress ENT - No sinus tenderness, no oral exudate, no LAN Cardiac - s1s2 regular, no murmur Chest - even/non-labored, lungs bilaterally with occasional faint wheeze posterior. No rales Back - No focal tenderness Abd - Soft, non-tender Ext - Trace LE edema Neuro - Normal strength Skin - No rashes Psych - normal mood, and behavior   Assessment/Plan  Discussion:  83 y/o M with recent admission for suspected COPD exacerbation.  He returns for hospital follow up.  Note he tells me he checks his O2 saturations at home and it in the 80's when he walks.  He is 96% on RA on arrival.  I am concerned he may need O2 with exertion but he refused to have it checked and does not want to wear O2.    COPD with Recent Exacerbation - no evidence of exacerbation on assessment  Pulmonary Nodule  Chronic Diastolic CHF - appears euvolemic on exam   Plan: Continue home Stiolto + PRN albuterol  Encouraged low sodium / heart healthy diet  Discussed importance of daily weights Encouraged him to allow [place] to assess his O2 needs Pt reports follow up with Dr. Shelle Iron in November at the [facility] Follow up with Dr. Kendrick Fries as needed Last nodule imaging in 12/2015  Patient Instructions  1.  So glad to hear you are feeling better  2.  Continue your Stiolto as prescribed 3.  Follow up with Dr. Shelle Iron at the [facility] in November 4.  Weigh yourself daily and track your weight on a piece of  paper. If you notice 3lb weight gain in 2 days or 5lbs in one week > please call your Cardiologist to be seen.  5.  Consider letting us evaluate you for oxygen needs.   6.  Please call if new or worsening symptoms 7.. Continue a low salt diet      Canary Brim, NP-C [facility]  [phone] 11/16/2016, 10:37 AM

## 2016-11-20 ENCOUNTER — Encounter: Payer: Self-pay | Admitting: Urology

## 2016-11-20 ENCOUNTER — Ambulatory Visit (INDEPENDENT_AMBULATORY_CARE_PROVIDER_SITE_OTHER): Payer: Medicare PPO | Admitting: Urology

## 2016-11-20 VITALS — BP 116/65 | HR 71 | Ht 67.0 in | Wt 173.0 lb

## 2016-11-20 DIAGNOSIS — R3129 Other microscopic hematuria: Secondary | ICD-10-CM | POA: Diagnosis not present

## 2016-11-20 LAB — URINALYSIS, COMPLETE
Bilirubin, UA: NEGATIVE
GLUCOSE, UA: NEGATIVE
KETONES UA: NEGATIVE
LEUKOCYTES UA: NEGATIVE
Nitrite, UA: NEGATIVE
PROTEIN UA: NEGATIVE
RBC UA: NEGATIVE
SPEC GRAV UA: 1.025 (ref 1.005–1.030)
UUROB: 0.2 mg/dL (ref 0.2–1.0)
pH, UA: 5.5 (ref 5.0–7.5)

## 2016-11-20 LAB — MICROSCOPIC EXAMINATION
Bacteria, UA: NONE SEEN
Epithelial Cells (non renal): NONE SEEN /hpf (ref 0–10)
RBC, UA: NONE SEEN /hpf (ref 0–?)
WBC UA: NONE SEEN /HPF (ref 0–?)

## 2016-11-20 NOTE — Progress Notes (Signed)
11/20/2016 2:36 PM   Timothy Cordova 1929/04/02 [id number]  Referring provider: Hannah Beat, MD [address] [place], [place] [address]  Chief Complaint  Patient presents with  . Hematuria    New Patient    HPI: The patient reported 1 day of gross hematuria when he was in the hospital for cardiac disease in June or July. He does have a smoking history. He was on daily aspirin. I don't think he takes a blood thinner otherwise. He is on doxazosin 4 mg a day.  He has a slow flow with poor pressure but does not strain. He gets up once a night.  He had a renal ultrasound in July that demonstrated a 17 mm hypoechoic lesion in the right kidney and bilateral cysts. It was recommended to order an MRI with and without contrast. It appeared to be a new finding from a distant MRI  He denies a history kidney stones and previous GU surgery.  Modifying factors: There are no other modifying factors  Associated signs and symptoms: There are no other associated signs and symptoms Aggravating and relieving factors: There are no other aggravating or relieving factors Severity: Moderate Duration: Persistent     PMH: Past Medical History:  Diagnosis Date  . Allergic rhinitis   . Benign prostatic hypertrophy   . Chronic diastolic heart failure (HCC)    8/08 ECHO with EF 55%  . CKD (chronic kidney disease)   . COPD (chronic obstructive pulmonary disease) (HCC)    moderate. followed by Dr.Byrum  . Coronary artery disease    s/p CABG 2006. LHC (1/10): SVG-OM and PLOM, SVG-D, and LIMA-LAD were all patent  . Dementia    (mild)--06/2006  . Diabetes mellitus   . Edema    localized. suspect diastolic CHF + venous insufficiency  . Hearing loss    left >>right  . History of tobacco abuse   . Hypercholesterolemia   . Hypertension   . Mixed hyperlipidemia   . Osteoarthritis   . Sleep apnea    ?    Surgical History: Past Surgical History:  Procedure Laterality Date  .  ANGIOPLASTY  [id number]  . CARDIAC CATHETERIZATION  90s   [place], [place] x1stent  . CARDIAC CATHETERIZATION     [facility];x2 stents  . CARDIAC CATHETERIZATION  04/29/2013  . CORONARY ARTERY BYPASS GRAFT  02/2005  . ROTATOR CUFF REPAIR  2005   left, Gioffre    Home Medications:  Allergies as of 11/20/2016      Reactions   Metformin And Related Nausea And Vomiting   Tramadol Hcl Other (See Comments)   Tremor vs seizure activity   Ace Inhibitors Cough   Atorvastatin Other (See Comments)   Makes the patient "weak"   Codeine Nausea And Vomiting   Gabapentin Other (See Comments)   "Messed up my nervous system"   Morphine Sulfate Nausea And Vomiting   Ticlopidine Hcl    Reaction unknown by patient or family   Warfarin Sodium Nausea And Vomiting      Medication List       Accurate as of 11/20/16  2:35 PM. Always use your most recent med list.          albuterol 108 (90 Base) MCG/ACT inhaler Commonly known as:  PROVENTIL HFA;VENTOLIN HFA Inhale 2 puffs into the lungs every 6 (six) hours as needed for wheezing or shortness of breath.   atorvastatin 40 MG tablet Commonly known as:  LIPITOR Take 40 mg by mouth daily.  Carboxymethylcellulose Sod PF 0.25 % Soln Place 1 drop into both eyes 4 (four) times daily.   CENTRUM SILVER 50+MEN Tabs Take 1 tablet by mouth daily.   diphenhydramine-acetaminophen 25-500 MG Tabs tablet Commonly known as:  TYLENOL PM Take 1 tablet by mouth at bedtime as needed (for sleep).   doxazosin 8 MG tablet Commonly known as:  CARDURA Take 4 mg by mouth at bedtime.   furosemide 40 MG tablet Commonly known as:  LASIX Take 20 mg by mouth daily.   glipiZIDE 5 MG tablet Commonly known as:  GLUCOTROL Take 1 tablet (5 mg total) by mouth 2 (two) times daily before a meal.   guaiFENesin 600 MG 12 hr tablet Commonly known as:  MUCINEX Take 1 tablet (600 mg total) by mouth 2 (two) times daily.   levofloxacin 500 MG tablet Commonly known as:  LEVAQUIN Take  1 tablet (500 mg total) by mouth daily.   metoprolol 200 MG 24 hr tablet Commonly known as:  TOPROL-XL Take 100 mg by mouth daily.   ONE TOUCH ULTRA TEST test strip Generic drug:  glucose blood USE TO CHECK BLOOD SUGAR 2 TIMES DAILY AS DIRECTED   ONETOUCH DELICA LANCETS 33G Misc   STIOLTO RESPIMAT 2.5-2.5 MCG/ACT Aers Generic drug:  Tiotropium Bromide-Olodaterol Inhale 2 puffs into the lungs every morning.       Allergies:  Allergies  Allergen Reactions  . Metformin And Related Nausea And Vomiting  . Tramadol Hcl Other (See Comments)    Tremor vs seizure activity  . Ace Inhibitors Cough  . Atorvastatin Other (See Comments)    Makes the patient "weak"  . Codeine Nausea And Vomiting  . Gabapentin Other (See Comments)    "Messed up my nervous system"  . Morphine Sulfate Nausea And Vomiting  . Ticlopidine Hcl     Reaction unknown by patient or family  . Warfarin Sodium Nausea And Vomiting    Family History: Family History  Problem Relation Age of Onset  . Heart attack Mother   . Lung cancer Sister   . Prostate cancer Neg Hx   . Kidney cancer Neg Hx     Social History:  reports that he quit smoking about 11 years ago. His smoking use included Cigarettes. He has a 180.00 pack-year smoking history. He has never used smokeless tobacco. He reports that he does not drink alcohol or use drugs.  ROS: UROLOGY Frequent Urination?: Yes Hard to postpone urination?: Yes Burning/pain with urination?: No Get up at night to urinate?: Yes Leakage of urine?: No Urine stream starts and stops?: Yes Trouble starting stream?: No Do you have to strain to urinate?: No Blood in urine?: Yes Urinary tract infection?: No Sexually transmitted disease?: No Injury to kidneys or bladder?: Yes Painful intercourse?: No Weak stream?: No Erection problems?: Yes Penile pain?: No  Gastrointestinal Nausea?: No Vomiting?: No Indigestion/heartburn?: No Diarrhea?: No Constipation?:  No  Constitutional Fever: No Night sweats?: No Weight loss?: Yes Fatigue?: Yes  Skin Skin rash/lesions?: No Itching?: No  Eyes Blurred vision?: No Double vision?: No  Ears/Nose/Throat Sore throat?: No Sinus problems?: No  Hematologic/Lymphatic Swollen glands?: No Easy bruising?: No  Cardiovascular Leg swelling?: No Chest pain?: Yes  Respiratory Cough?: Yes Shortness of breath?: Yes  Endocrine Excessive thirst?: No  Musculoskeletal Back pain?: Yes Joint pain?: Yes  Neurological Headaches?: No Dizziness?: No  Psychologic Depression?: No Anxiety?: No  Physical Exam: BP 116/65   Pulse 71   Ht 5\' 7"  (1.702 m)  Wt 173 lb (78.5 kg)   BMI 27.10 kg/m   Constitutional:  Alert and oriented, No acute distress. HEENT: [place] AT, moist mucus membranes.  Trachea midline, no masses. Cardiovascular: No clubbing, cyanosis, or edema. Respiratory: Normal respiratory effort, no increased work of breathing. GI: Abdomen is soft, nontender, nondistended, no abdominal masses GU: No CVA tenderness. The patient after being asked twice did not want a rectal examination and I thought this was very reasonable. Skin: No rashes, bruises or suspicious lesions. Lymph: No cervical or inguinal adenopathy. Neurologic: Grossly intact, no focal deficits, moving all 4 extremities. Psychiatric: Normal mood and affect.  Laboratory Data: Lab Results  Component Value Date   WBC 20.5 cH (HH) 10/25/2016   HGB 13.1 10/25/2016   HCT 41.9 10/25/2016   MCV 96.2 10/25/2016   PLT 274.0 10/25/2016    Lab Results  Component Value Date   CREATININE 1.67 (H) 10/25/2016    Lab Results  Component Value Date   PSA 5.52 (H) 06/26/2016   PSA 3.33 12/17/2007    No results found for: TESTOSTERONE  Lab Results  Component Value Date   HGBA1C 7.8 (H) 10/16/2016    Urinalysis    Component Value Date/Time   COLORURINE YELLOW 10/14/2016 2003   APPEARANCEUR CLEAR 10/14/2016 2003   LABSPEC  1.007 10/14/2016 2003   PHURINE 5.0 10/14/2016 2003   GLUCOSEU >=500 (A) 10/14/2016 2003   HGBUR MODERATE (A) 10/14/2016 2003   BILIRUBINUR NEGATIVE 10/14/2016 2003   KETONESUR NEGATIVE 10/14/2016 2003   PROTEINUR NEGATIVE 10/14/2016 2003   NITRITE NEGATIVE 10/14/2016 2003   LEUKOCYTESUR NEGATIVE 10/14/2016 2003    Pertinent Imaging: As above  Assessment & Plan:  The patient did not have microscopic hematuria today. The renal abnormality was discussed. Workup for blood in the urine was discussed. He does have a smoking history. The patient did not want to be worked up. Again the differential diagnosis was discussed. I think based on his age that this was a reasonable decision and I certainly want to respect  1. Microscopic hematuria 2. Nighttime frequency 3. Weak flow   - Urinalysis, Complete   No Follow-up on file.  Martina Sinner, MD  [facility] [address], [address] [place], [place] [address] [phone]

## 2016-12-20 ENCOUNTER — Ambulatory Visit (INDEPENDENT_AMBULATORY_CARE_PROVIDER_SITE_OTHER)
Admission: RE | Admit: 2016-12-20 | Discharge: 2016-12-20 | Disposition: A | Discharge status: Home / Self Care | Payer: Medicare PPO | Source: Ambulatory Visit | Attending: Internal Medicine | Admitting: Internal Medicine

## 2016-12-20 ENCOUNTER — Encounter: Payer: Self-pay | Admitting: Internal Medicine

## 2016-12-20 ENCOUNTER — Ambulatory Visit (INDEPENDENT_AMBULATORY_CARE_PROVIDER_SITE_OTHER): Payer: Medicare PPO | Admitting: Internal Medicine

## 2016-12-20 VITALS — BP 104/62 | HR 83 | Temp 98.3°F | Ht 67.0 in | Wt 176.0 lb

## 2016-12-20 DIAGNOSIS — R6889 Other general symptoms and signs: Secondary | ICD-10-CM

## 2016-12-20 DIAGNOSIS — R05 Cough: Secondary | ICD-10-CM

## 2016-12-20 DIAGNOSIS — R0602 Shortness of breath: Secondary | ICD-10-CM | POA: Diagnosis not present

## 2016-12-20 DIAGNOSIS — E1121 Type 2 diabetes mellitus with diabetic nephropathy: Secondary | ICD-10-CM | POA: Diagnosis not present

## 2016-12-20 DIAGNOSIS — I10 Essential (primary) hypertension: Secondary | ICD-10-CM | POA: Diagnosis not present

## 2016-12-20 DIAGNOSIS — R059 Cough, unspecified: Secondary | ICD-10-CM | POA: Insufficient documentation

## 2016-12-20 DIAGNOSIS — J441 Chronic obstructive pulmonary disease with (acute) exacerbation: Secondary | ICD-10-CM | POA: Diagnosis not present

## 2016-12-20 MED ORDER — LEVOFLOXACIN 500 MG PO TABS: 500.0000 mg | ORAL_TABLET | Freq: Every day | ORAL | 0 refills | Status: DC

## 2016-12-20 MED ORDER — PREDNISONE 10 MG PO TABS: ORAL_TABLET | ORAL | 0 refills | Status: DC

## 2016-12-20 MED ORDER — HYDROCODONE-HOMATROPINE 5-1.5 MG/5ML PO SYRP: 5.0000 mL | ORAL_SOLUTION | Freq: Four times a day (QID) | ORAL | 0 refills | Status: DC | PRN

## 2016-12-20 NOTE — Progress Notes (Signed)
Subjective:    Patient ID: Timothy Cordova, male    DOB: 13-Aug-1928, 83 y.o.   MRN: [id number]  HPI  Here with acute onset mild to mod 2-3 days ST, HA, general weakness and malaise, with prod cough greenish sputum, but Pt denies chest pain, increased sob or doe, wheezing, orthopnea, PND, increased LE swelling, palpitations, dizziness or syncope, except for mild wheezing and sob since last night.   Pt denies polydipsia, polyuria Past Medical History:  Diagnosis Date  . Allergic rhinitis   . Benign prostatic hypertrophy   . Chronic diastolic heart failure (HCC)    8/08 ECHO with EF 55%  . CKD (chronic kidney disease)   . COPD (chronic obstructive pulmonary disease) (HCC)    moderate. followed by Dr.Byrum  . Coronary artery disease    s/p CABG 2006. LHC (1/10): SVG-OM and PLOM, SVG-D, and LIMA-LAD were all patent  . Dementia    (mild)--06/2006  . Diabetes mellitus   . Edema    localized. suspect diastolic CHF + venous insufficiency  . Hearing loss    left >>right  . History of tobacco abuse   . Hypercholesterolemia   . Hypertension   . Mixed hyperlipidemia   . Osteoarthritis   . Sleep apnea    ?   Past Surgical History:  Procedure Laterality Date  . ANGIOPLASTY  [id number]  . CARDIAC CATHETERIZATION  90s   [place]  . CARDIAC CATHETERIZATION     [facility];x2 stents  . CARDIAC CATHETERIZATION  04/29/2013  . CORONARY ARTERY BYPASS GRAFT  02/2005  . ROTATOR CUFF REPAIR  2005   left, Gioffre    reports that he quit smoking about 11 years ago. His smoking use included Cigarettes. He has a 180.00 pack-year smoking history. He has never used smokeless tobacco. He reports that he does not drink alcohol or use drugs. family history includes Heart attack in his mother; Lung cancer in his sister. Allergies  Allergen Reactions  . Metformin And Related Nausea And Vomiting  . Tramadol Hcl Other (See Comments)    Tremor vs seizure activity  . Ace Inhibitors Cough  .  Atorvastatin Other (See Comments)    Makes the patient "weak"  . Codeine Nausea And Vomiting  . Gabapentin Other (See Comments)    "Messed up my nervous system"  . Morphine Sulfate Nausea And Vomiting  . Ticlopidine Hcl     Reaction unknown by patient or family  . Warfarin Sodium Nausea And Vomiting   Current Outpatient Prescriptions on File Prior to Visit  Medication Sig Dispense Refill  . albuterol (PROVENTIL HFA;VENTOLIN HFA) 108 (90 Base) MCG/ACT inhaler Inhale 2 puffs into the lungs every 6 (six) hours as needed for wheezing or shortness of breath. 2 Inhaler 0  . atorvastatin (LIPITOR) 40 MG tablet Take 40 mg by mouth daily.    . Carboxymethylcellulose Sod PF 0.25 % SOLN Place 1 drop into both eyes 4 (four) times daily.     . diphenhydramine-acetaminophen (TYLENOL PM) 25-500 MG TABS tablet Take 1 tablet by mouth at bedtime as needed (for sleep).     [name]. doxazosin (CARDURA) 8 MG tablet Take 4 mg by mouth at bedtime.     . furosemide (LASIX) 40 MG tablet Take 20 mg by mouth daily.    [name]. glipiZIDE (GLUCOTROL) 5 MG tablet Take 1 tablet (5 mg total) by mouth 2 (two) times daily before a meal. 180 tablet 3  . guaiFENesin (MUCINEX) 600 MG 12 hr tablet  Take 1 tablet (600 mg total) by mouth 2 (two) times daily. 20 tablet 0  . metoprolol (TOPROL-XL) 200 MG 24 hr tablet Take 100 mg by mouth daily.    . Multiple Vitamins-Minerals (CENTRUM SILVER 50+MEN) TABS Take 1 tablet by mouth daily.    . ONE TOUCH ULTRA TEST test strip USE TO CHECK BLOOD SUGAR 2 TIMES DAILY AS DIRECTED 50 each 11  . ONETOUCH DELICA LANCETS 33G MISC     . Tiotropium Bromide-Olodaterol (STIOLTO RESPIMAT) 2.5-2.5 MCG/ACT AERS Inhale 2 puffs into the lungs every morning.      No current facility-administered medications on file prior to visit.    Review of Systems  Constitutional: Negative for other unusual diaphoresis or sweats HENT: Negative for ear discharge or swelling Eyes: Negative for other worsening visual  disturbances Respiratory: Negative for stridor or other swelling  Gastrointestinal: Negative for worsening distension or other blood Genitourinary: Negative for retention or other urinary change Musculoskeletal: Negative for other MSK pain or swelling Skin: Negative for color change or other new lesions Neurological: Negative for worsening tremors and other numbness  Psychiatric/Behavioral: Negative for worsening agitation or other fatigue All other system neg per pt    Objective:   Physical Exam BP 104/62   Pulse 83   Temp 98.3 F (36.8 C) (Oral)   Ht  (1.702 m)   Wt 176 lb (79.8 kg)   SpO2 96%   BMI 27.57 kg/m  VS noted, mild ill appearing Constitutional: Pt appears in NAD HENT: Head: NCAT.  Right Ear: External ear normal.  Left Ear: External ear normal.  Eyes: . Pupils are equal, round, and reactive to light. Conjunctivae and EOM are normal Nose: without d/c or deformity Bilat tm's with mild erythema.  Max sinus areas non tender.  Pharynx with mild erythema, no exudate Neck: Neck supple. Gross normal ROM Cardiovascular: Normal rate and regular rhythm.   Pulmonary/Chest: Effort normal and breath sounds decreased without rales but with few scattered  wheezing.  Neurological: Pt is alert. At baseline orientation, motor grossly intact Skin: Skin is warm. No rashes, other new lesions, no LE edema Psychiatric: Pt behavior is normal without agitation  No other exam findings  POCT - Influenza - negative     Assessment & Plan:

## 2016-12-20 NOTE — Patient Instructions (Addendum)
Your Flu test was negative  Please take all new medication as prescribed - the antibiotic, cough medicine, and prednison  Please continue all other medications as before, and refills have been done if requested.  Please have the pharmacy call with any other refills you may need.  Please keep your appointments with your specialists as you may have planned  Please go to the XRAY Department in the Basement (go straight as you get off the elevator) for the x-ray testing  You will be contacted by phone if any changes need to be made immediately.  Otherwise, you will receive a letter about your results with an explanation, but please check with MyChart first.  Please remember to sign up for MyChart if you have not done so, as this will be important to you in the future with finding out test results, communicating by private email, and scheduling acute appointments online when needed.

## 2016-12-21 ENCOUNTER — Telehealth: Payer: Self-pay

## 2016-12-21 NOTE — Telephone Encounter (Signed)
-----   Message from Corwin LevinsJames W John, MD sent at 12/20/2016  6:27 PM EDT ----- Letter sent, cont same tx except  Rafaella Kole to let pt (or son) know - NO pneumonia on the xray, and letter will follow

## 2016-12-21 NOTE — Telephone Encounter (Signed)
Pt has been informed and expressed understanding.  

## 2016-12-23 NOTE — Assessment & Plan Note (Signed)
Mild to mod, c/w bronchitis vs pna, for cxr, for antibx and cough me prn, to f/u any worsening symptoms or concerns

## 2016-12-23 NOTE — Assessment & Plan Note (Signed)
.  stable overall by history and exam, recent data reviewed with pt, and pt to continue medical treatment as before,  to f/u any worsening symptoms or concerns Lab Results  Component Value Date   HGBA1C 7.8 (H) 10/16/2016  Pt to call for onset polys or cbg >200 with tx

## 2016-12-23 NOTE — Assessment & Plan Note (Signed)
Mild to mod, for predpac asd, inhaler prn, to f/u any worsening symptoms or concerns 

## 2016-12-23 NOTE — Assessment & Plan Note (Signed)
stable overall by history and exam, recent data reviewed with pt, and pt to continue medical treatment as before,  to f/u any worsening symptoms or concerns BP Readings from Last 3 Encounters:  12/20/16 104/62  11/20/16 116/65  11/15/16 120/70

## 2016-12-25 ENCOUNTER — Encounter: Payer: Self-pay | Admitting: Family Medicine

## 2016-12-25 ENCOUNTER — Ambulatory Visit (INDEPENDENT_AMBULATORY_CARE_PROVIDER_SITE_OTHER): Payer: Medicare PPO | Admitting: Family Medicine

## 2016-12-25 VITALS — BP 100/60 | HR 44 | Temp 97.6°F | Ht 66.0 in | Wt 176.2 lb

## 2016-12-25 DIAGNOSIS — J441 Chronic obstructive pulmonary disease with (acute) exacerbation: Secondary | ICD-10-CM

## 2016-12-25 DIAGNOSIS — N183 Chronic kidney disease, stage 3 unspecified: Secondary | ICD-10-CM

## 2016-12-25 DIAGNOSIS — E1121 Type 2 diabetes mellitus with diabetic nephropathy: Secondary | ICD-10-CM

## 2016-12-25 DIAGNOSIS — Z79899 Other long term (current) drug therapy: Secondary | ICD-10-CM | POA: Diagnosis not present

## 2016-12-25 LAB — CBC WITH DIFFERENTIAL/PLATELET
BASOS ABS: 0 10*3/uL (ref 0.0–0.1)
Basophils Relative: 0.4 % (ref 0.0–3.0)
EOS PCT: 1.6 % (ref 0.0–5.0)
Eosinophils Absolute: 0.1 10*3/uL (ref 0.0–0.7)
HEMATOCRIT: 40.1 % (ref 39.0–52.0)
Hemoglobin: 13 g/dL (ref 13.0–17.0)
LYMPHS ABS: 1.4 10*3/uL (ref 0.7–4.0)
Lymphocytes Relative: 16.2 % (ref 12.0–46.0)
MCHC: 32.5 g/dL (ref 30.0–36.0)
MCV: 94.3 fl (ref 78.0–100.0)
MONOS PCT: 10.2 % (ref 3.0–12.0)
Monocytes Absolute: 0.9 10*3/uL (ref 0.1–1.0)
Neutro Abs: 6.1 10*3/uL (ref 1.4–7.7)
Neutrophils Relative %: 71.6 % (ref 43.0–77.0)
Platelets: 197 10*3/uL (ref 150.0–400.0)
RBC: 4.25 Mil/uL (ref 4.22–5.81)
RDW: 14.1 % (ref 11.5–15.5)
WBC: 8.6 10*3/uL (ref 4.0–10.5)

## 2016-12-25 LAB — BASIC METABOLIC PANEL
BUN: 34 mg/dL — ABNORMAL HIGH (ref 6–23)
CALCIUM: 9.8 mg/dL (ref 8.4–10.5)
CO2: 29 mEq/L (ref 19–32)
Chloride: 104 mEq/L (ref 96–112)
Creatinine, Ser: 1.51 mg/dL — ABNORMAL HIGH (ref 0.40–1.50)
GFR: 46.73 mL/min — AB (ref >60.00)
GLUCOSE: 123 mg/dL — AB (ref 70–99)
POTASSIUM: 4.2 meq/L (ref 3.5–5.1)
SODIUM: 139 meq/L (ref 135–145)

## 2016-12-25 MED ORDER — PREDNISONE 20 MG PO TABS: ORAL_TABLET | ORAL | 0 refills | Status: DC

## 2016-12-25 NOTE — Progress Notes (Signed)
Dr. Karleen Hampshire T. Howard Patton, MD, CAQ [facility] [address] [place], [address] Phone: [phone] Fax: [phone]  12/25/2016  Patient: Timothy Cordova, MRN: [id number], DOB: 01-Oct-1928, 83 y.o.  Primary Physician:  Hannah Beat, MD   Chief Complaint  Patient presents with  . Follow-up   Subjective:   AHMADOU BOLZ is a 83 y.o. very pleasant male patient who presents with the following:  2 mo f/u:  9/12 OV Dr. Jonny Ruiz COPD exac + bronchitis He has significant COPD, and he was placed on Levaquin and prednisone. He continues to have some shortness of breath. Pulse ox is 91% today. He has been resistant to going on oxygen.  Diabetes follow-up.  Diabetes Mellitus: Tolerating Medications: yes Compliance with diet: fair Exercise: minimal / intermittent Avg blood sugars at home: not checking Foot problems: none Hypoglycemia: none No nausea, vomitting, blurred vision, polyuria.  Lab Results  Component Value Date   HGBA1C 7.8 (H) 10/16/2016   HGBA1C 8.0 (H) 10/15/2016   HGBA1C 7.6 (H) 06/26/2016   Lab Results  Component Value Date   MICROALBUR 6.8 (H) 06/26/2016   LDLCALC 78 06/26/2016   CREATININE 1.67 (H) 10/25/2016    Wt Readings from Last 3 Encounters:  12/25/16 176 lb 4 oz (79.9 kg)  12/20/16 176 lb (79.8 kg)  11/20/16 173 lb (78.5 kg)    Body mass index is 28.45 kg/m.   Fu CKD III   Past Medical History, Surgical History, Social History, Family History, Problem List, Medications, and Allergies have been reviewed and updated if relevant.  Patient Active Problem List   Diagnosis Date Noted  . CKD (chronic kidney disease) stage 3, GFR 30-59 ml/min 08/18/2010    Priority: High  . DIASTOLIC HEART FAILURE, CHRONIC 08/18/2009    Priority: High  . CAD, AUTOLOGOUS BYPASS GRAFT 06/07/2008    Priority: High  . DM (diabetes mellitus), type 2 with renal complications (HCC) 09/04/2007    Priority: High  .  Centrilobular emphysema (HCC) 08/08/2005    Priority: High  . Cough 12/20/2016  . Elevated troponin level 10/14/2016  . Acute respiratory failure (HCC) 10/14/2016  . COPD exacerbation (HCC) 10/14/2016  . Solitary pulmonary nodule 12/10/2015  . Anemia 11/26/2014  . Dyspnea 11/26/2014  . COPD with acute exacerbation (HCC) 05/21/2013  . Aortic calcification, 04/20/2005 CT Chest 04/03/2013  . Sleep apnea   . Tobacco abuse (in remission)   . Chronic low back pain 04/18/2011  . Renal lesion 03/27/2011  . OSTEOARTHRITIS 12/06/2009  . Mixed hyperlipidemia 06/11/2008  . ALLERGIC RHINITIS 01/30/2007  . HEARING LOSS 07/06/2006  . Essential hypertension 07/06/2006  . BENIGN PROSTATIC HYPERTROPHY 07/06/2006    Past Medical History:  Diagnosis Date  . Allergic rhinitis   . Benign prostatic hypertrophy   . Chronic diastolic heart failure (HCC)    8/08 ECHO with EF 55%  . CKD (chronic kidney disease)   . COPD (chronic obstructive pulmonary disease) (HCC)    moderate. followed by Dr.Byrum  . Coronary artery disease    s/p CABG 2006. LHC (1/10): SVG-OM and PLOM, SVG-D, and LIMA-LAD were all patent  . Dementia    (mild)--06/2006  . Diabetes mellitus   . Edema    localized. suspect diastolic CHF + venous insufficiency  . Hearing loss    left >>right  . History of tobacco abuse   . Hypercholesterolemia   . Hypertension   . Mixed hyperlipidemia   . Osteoarthritis   .  Sleep apnea    ?    Past Surgical History:  Procedure Laterality Date  . ANGIOPLASTY  [id number]  . CARDIAC CATHETERIZATION  90s   [place], [place] x1stent  . CARDIAC CATHETERIZATION     [facility];x2 stents  . CARDIAC CATHETERIZATION  04/29/2013  . CORONARY ARTERY BYPASS GRAFT  02/2005  . ROTATOR CUFF REPAIR  2005   left, [place]    Social History   Social History  . Marital status: Married    Spouse name: N/A  . Number of children: N/A  . Years of education: N/A   Occupational History  . rubber fabrication Retired     RETIRED   Social History Main Topics  . Smoking status: Former Smoker    Packs/day: 3.00    Years: 60.00    Types: Cigarettes    Quit date: 07/12/2005  . Smokeless tobacco: Never Used  . Alcohol use No  . Drug use: No  . Sexual activity: Not on file   Other Topics Concern  . Not on file   Social History Narrative  . No narrative on file    Family History  Problem Relation Age of Onset  . Heart attack Mother   . Lung cancer Sister   . Prostate cancer Neg Hx   . Kidney cancer Neg Hx     Allergies  Allergen Reactions  . Metformin And Related Nausea And Vomiting  . Tramadol Hcl Other (See Comments)    Tremor vs seizure activity  . Ace Inhibitors Cough  . Atorvastatin Other (See Comments)    Makes the patient "weak"  . Codeine Nausea And Vomiting  . Gabapentin Other (See Comments)    "Messed up my nervous system"  . Morphine Sulfate Nausea And Vomiting  . Ticlopidine Hcl     Reaction unknown by patient or family  . Warfarin Sodium Nausea And Vomiting    Medication list reviewed and updated in full in [facility].  ROS: GEN: Acute illness details above GI: Tolerating PO intake GU: maintaining adequate hydration and urination Pulm: No SOB Interactive and getting along well at home.  Otherwise, ROS is as per the HPI.  Objective:   BP 100/60   Pulse (!) 44   Temp 97.6 F (36.4 C) (Oral)   Ht  (1.676 m)   Wt 176 lb 4 oz (79.9 kg)   SpO2 91%   BMI 28.45 kg/m    GEN: A and O x 3. WDWN. NAD.    ENT: Nose clear, ext NML.  No LAD.  No JVD.  TM's clear. Oropharynx clear.  PULM: Normal WOB, no distress. No crackles, scattered wheezes and rhonchi throughout CV: RRR, no M/G/R, No rubs, No JVD.   EXT: warm and well-perfused, No c/c/e. PSYCH: Pleasant and conversant.    Laboratory and Imaging Data:  Assessment and Plan:   COPD exacerbation (HCC)  Encounter for long-term (current) use of medications - Plan: Basic metabolic panel, CBC with  Differential/Platelet  Type 2 diabetes mellitus with diabetic nephropathy, without long-term current use of insulin (HCC)  CKD (chronic kidney disease) stage 3, GFR 30-59 ml/min  Extend prednisone for COPD exacerbation.  Check basic labs and follow-up renal  Follow-up: Return in about 6 months (around 06/24/2017).  No future appointments.  Meds ordered this encounter  Medications  . predniSONE (DELTASONE) 20 MG tablet    Sig: 2 tabs po for 4 days, then 1 tab for 3 days    Dispense:  11 tablet  Refill:  0   Medications Discontinued During This Encounter  Medication Reason  . HYDROcodone-homatropine (HYCODAN) 5-1.5 MG/5ML syrup    Orders Placed This Encounter  Procedures  . Basic metabolic panel  . CBC with Differential/Platelet    Signed,  Karleen Hampshire T. Jalena Vanderlinden, MD   Patient's Medications  New Prescriptions   PREDNISONE (DELTASONE) 20 MG TABLET    2 tabs po for 4 days, then 1 tab for 3 days  Previous Medications   ALBUTEROL (PROVENTIL HFA;VENTOLIN HFA) 108 (90 BASE) MCG/ACT INHALER    Inhale 2 puffs into the lungs every 6 (six) hours as needed for wheezing or shortness of breath.   ATORVASTATIN (LIPITOR) 40 MG TABLET    Take 40 mg by mouth daily.   CARBOXYMETHYLCELLULOSE SOD PF 0.25 % SOLN    Place 1 drop into both eyes 4 (four) times daily.    DIPHENHYDRAMINE-ACETAMINOPHEN (TYLENOL PM) 25-500 MG TABS TABLET    Take 1 tablet by mouth at bedtime as needed (for sleep).    DOXAZOSIN (CARDURA) 8 MG TABLET    Take 4 mg by mouth at bedtime.    FUROSEMIDE (LASIX) 40 MG TABLET    Take 20 mg by mouth daily.   GLIPIZIDE (GLUCOTROL) 5 MG TABLET    Take 1 tablet (5 mg total) by mouth 2 (two) times daily before a meal.   GUAIFENESIN (MUCINEX) 600 MG 12 HR TABLET    Take 1 tablet (600 mg total) by mouth 2 (two) times daily.   LEVOFLOXACIN (LEVAQUIN) 500 MG TABLET    Take 1 tablet (500 mg total) by mouth daily.   METOPROLOL (TOPROL-XL) 200 MG 24 HR TABLET    Take 100 mg by mouth daily.    MULTIPLE VITAMINS-MINERALS (CENTRUM SILVER 50+MEN) TABS    Take 1 tablet by mouth daily.   ONE TOUCH ULTRA TEST TEST STRIP    USE TO CHECK BLOOD SUGAR 2 TIMES DAILY AS DIRECTED   ONETOUCH DELICA LANCETS 33G MISC       PREDNISONE (DELTASONE) 10 MG TABLET    3 tabs by mouth per day for 3 days,2tabs per day for 3 days,1tab per day for 3 days   TIOTROPIUM BROMIDE-OLODATEROL (STIOLTO RESPIMAT) 2.5-2.5 MCG/ACT AERS    Inhale 2 puffs into the lungs every morning.   Modified Medications   No medications on file  Discontinued Medications   HYDROCODONE-HOMATROPINE (HYCODAN) 5-1.5 MG/5ML SYRUP    Take 5 mLs by mouth every 6 (six) hours as needed for cough.

## 2017-05-15 ENCOUNTER — Telehealth: Payer: Self-pay | Admitting: Cardiovascular Disease

## 2017-05-15 NOTE — Telephone Encounter (Signed)
Patient came by office States he goes to [facility] now and will no longer need to follow up with Dr Mariah MillingGollan Deleting recall

## 2017-05-31 IMAGING — DX DG CHEST 2V
2 series · 2 of 2 positions shown · non-contrast
Comparison: CT scan 11/30/2014.  Chest x-ray 11/26/2014.

CLINICAL DATA: Two week history of shortness of breath.

EXAM:
CHEST  2 VIEW

[chest lat]
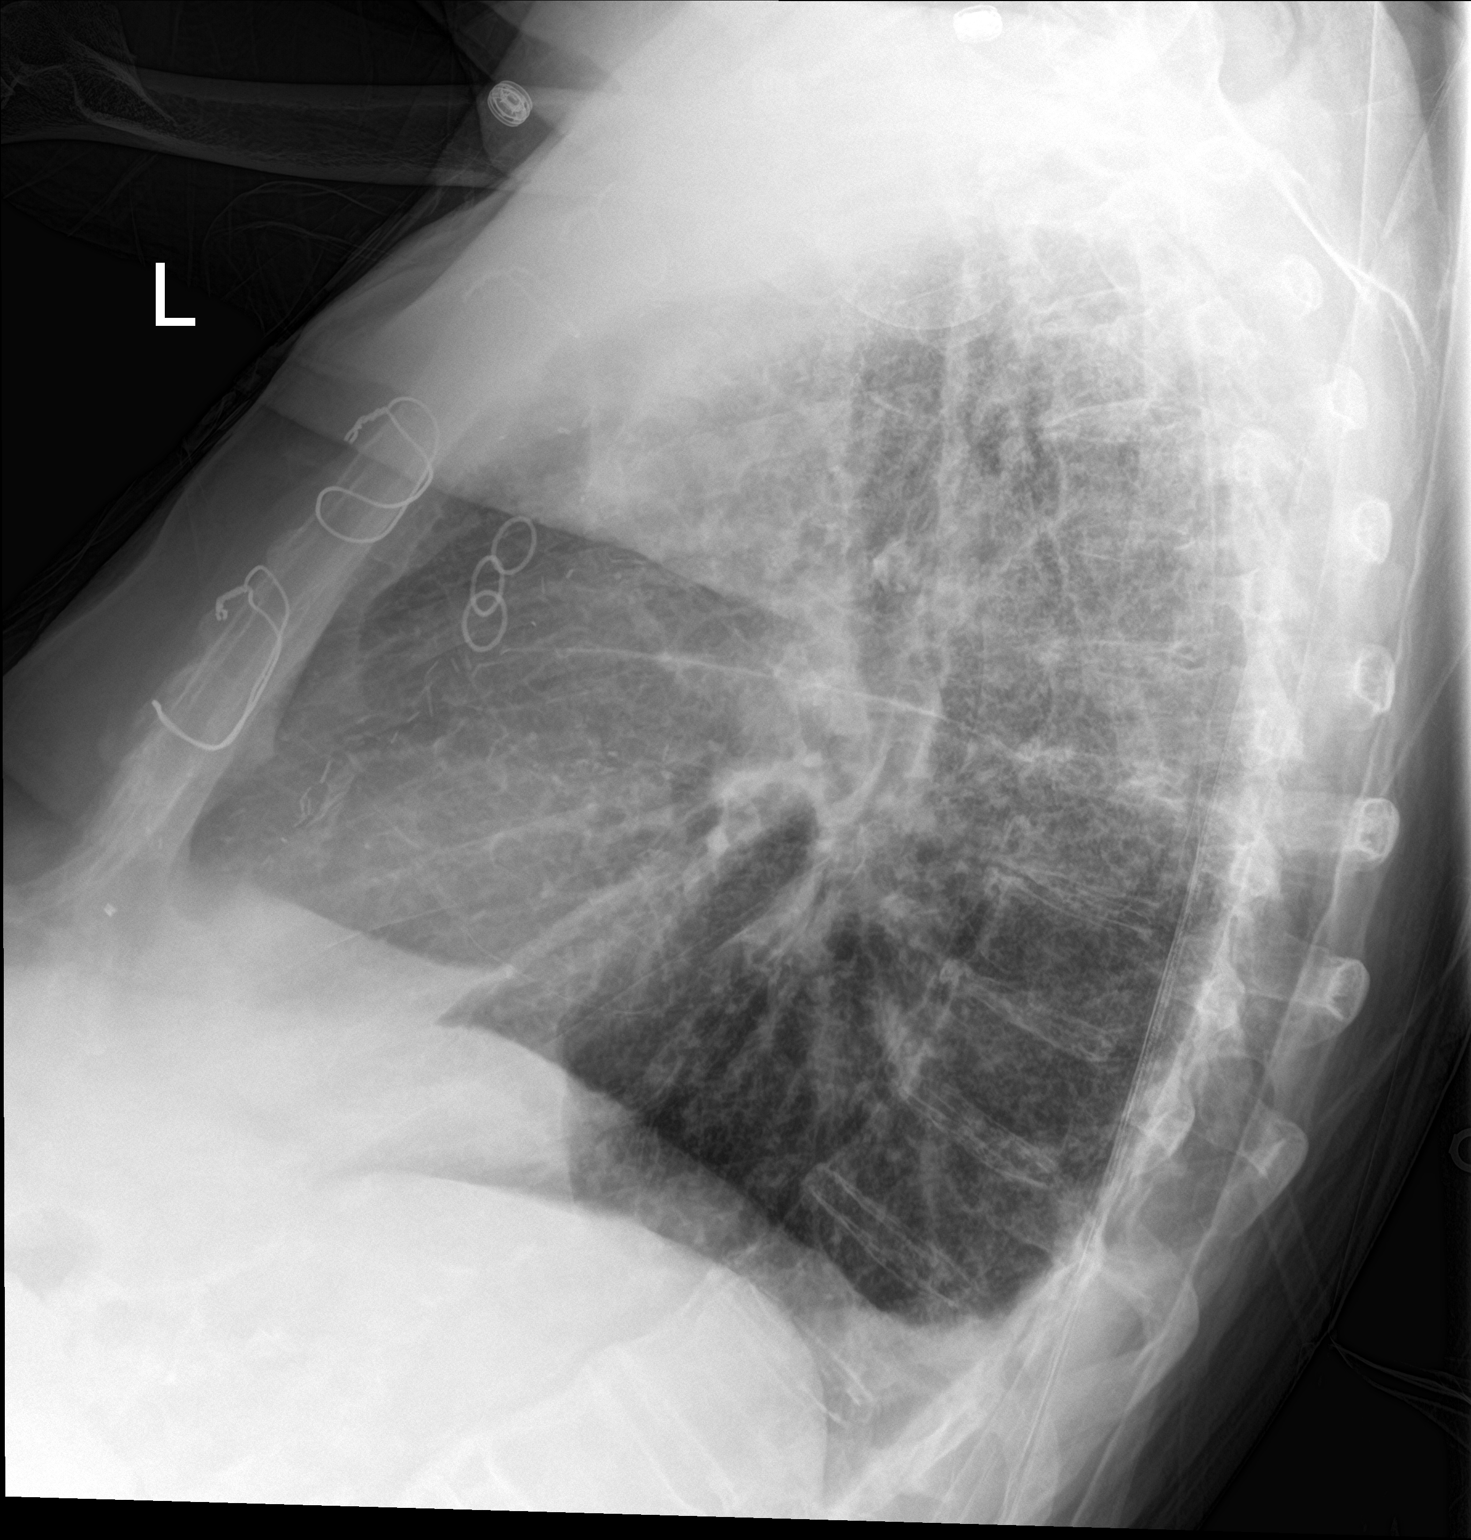

[chest ap]
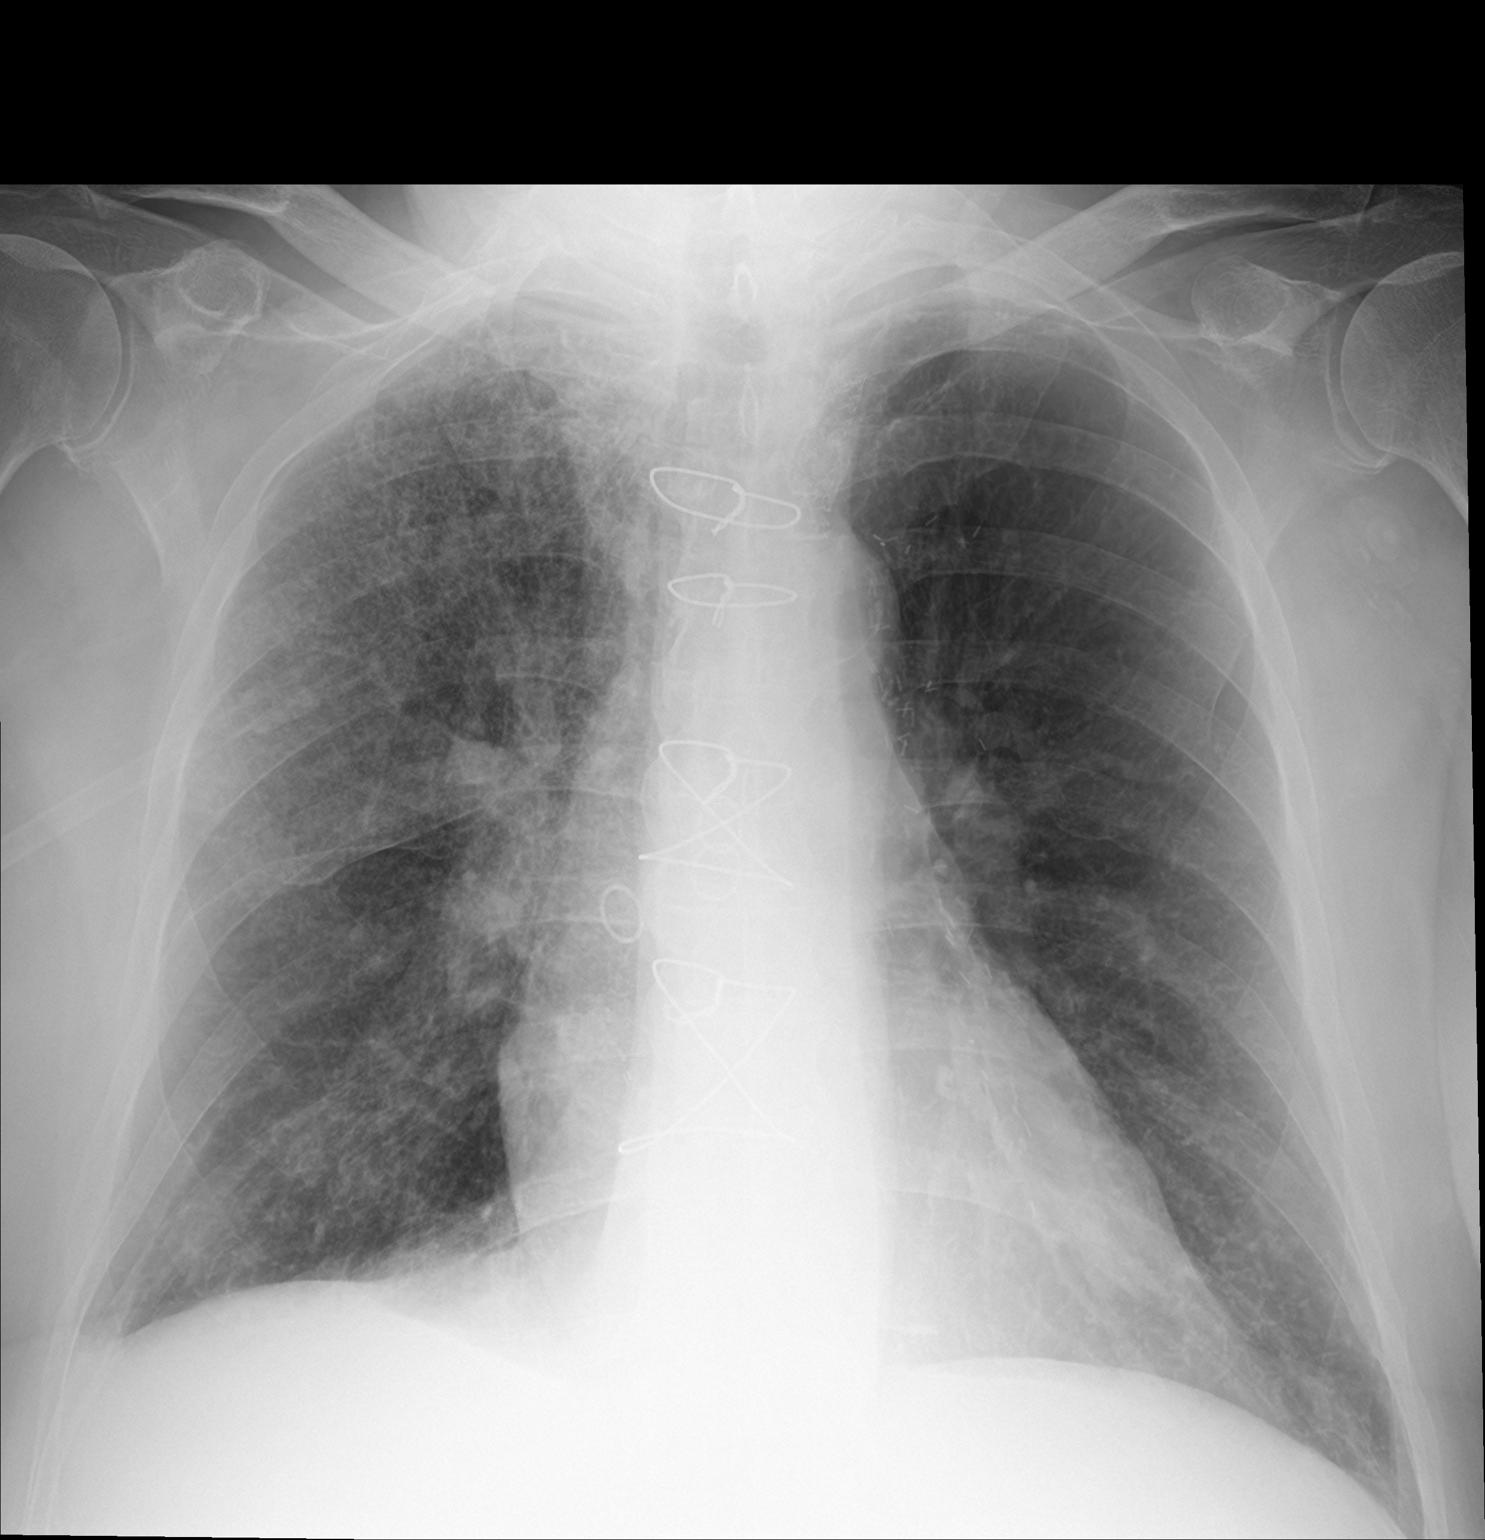

[2 of 2 positions shown; findings below may reference images not displayed]

FINDINGS: Two views study shows hyperexpansion. Diffuse interstitial and
alveolar opacity in the right upper lung is new in the interval.
There is some nodularity in fullness in the right hilum which was
not present on the previous chest x-ray. Interstitial markings are
diffusely coarsened with chronic features. The cardiopericardial
silhouette is within normal limits for size. Patient is status post
CABG
IMPRESSION: Interval development of interstitial and alveolar opacity in the
right upper lung with some fullness in the right hilum. Imaging
features raise concern for central lesion with postobstructive
process. CT chest with contrast recommended to further evaluate.

## 2017-05-31 IMAGING — CT CT ANGIO CHEST
2 of 6 series · 18 of 36 positions shown · IV contrast (Omni 300)
Comparison: Chest radiograph dated 10/06/2015 and CT dated
11/30/2014

ADDENDUM:
Please note there is a typographical error in the impression of the
original report. The correct impression should read:

No CT evidence of pulmonary embolism.
CLINICAL DATA: 87-year-old male with shortness of breath and
tachycardia
EXAM:
CT ANGIOGRAPHY CHEST WITH CONTRAST
TECHNIQUE: Multidetector CT imaging of the chest was performed using the
standard protocol during bolus administration of intravenous
contrast. Multiplanar CT image reconstructions and MIPs were
obtained to evaluate the vascular anatomy.
CONTRAST:  70 cc Isovue 370

[Series 7: pe thins · axial · 0.67mm/px · z∈[+1045,+1302]mm · 17 of 291 slices shown]
[im 17/291  lung]
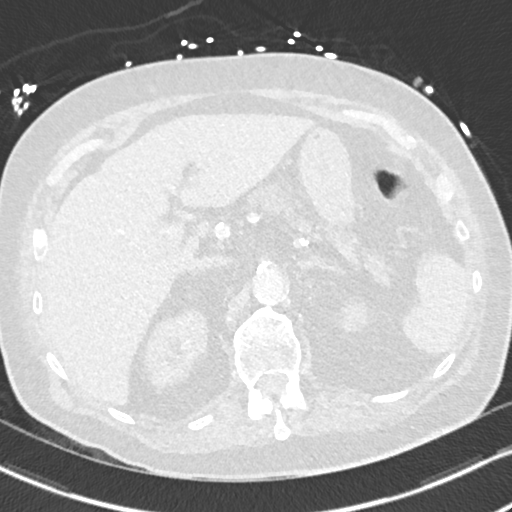
[im 33/291  mediastinal]
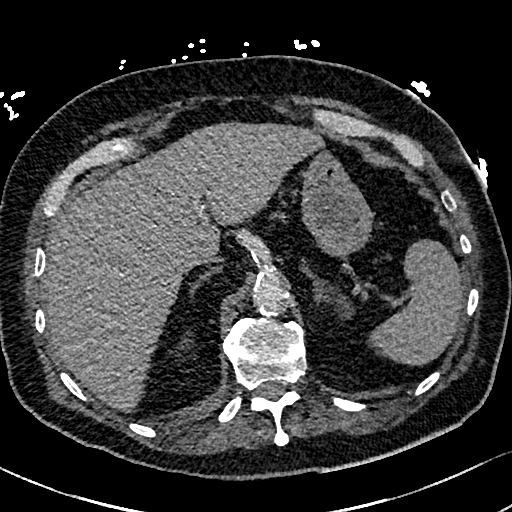
[im 49/291  lung]
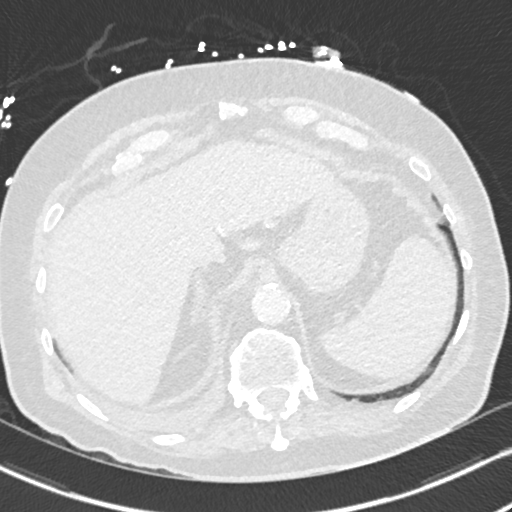
[im 65/291  mediastinal]
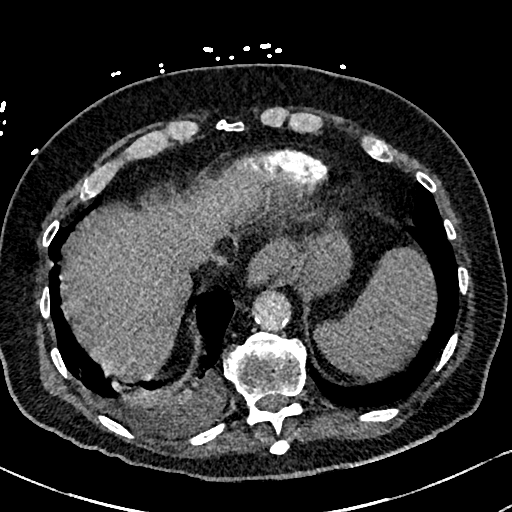
[im 81/291  lung]
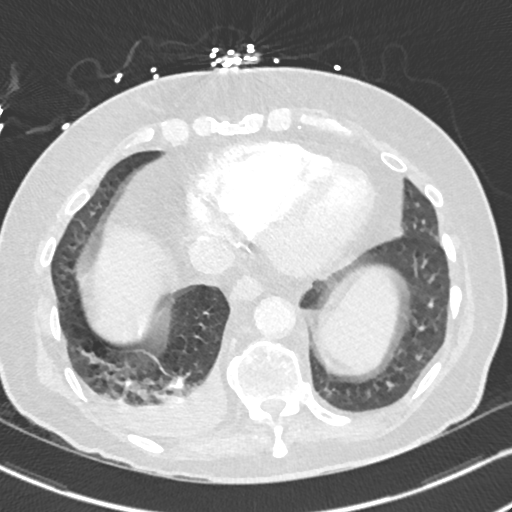
[im 97/291  mediastinal]
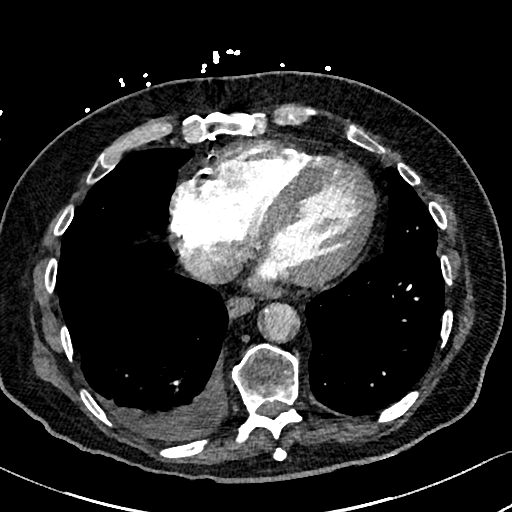
[im 113/291  lung]
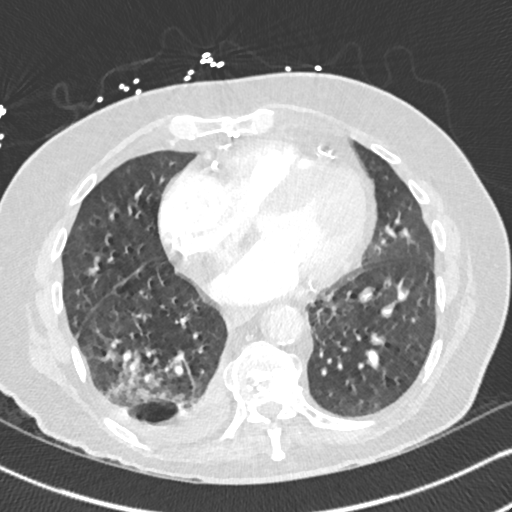
[im 129/291  mediastinal]
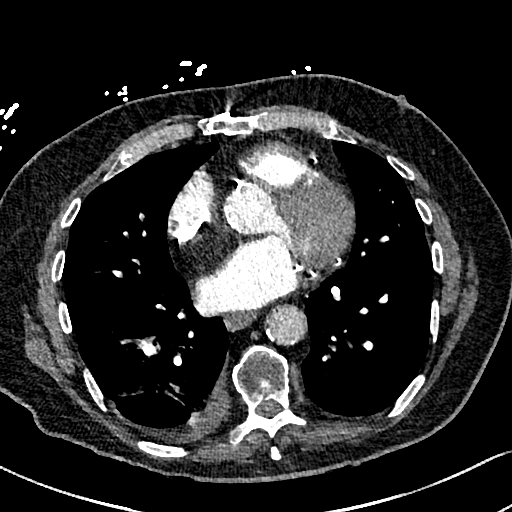
[im 146/291  lung]
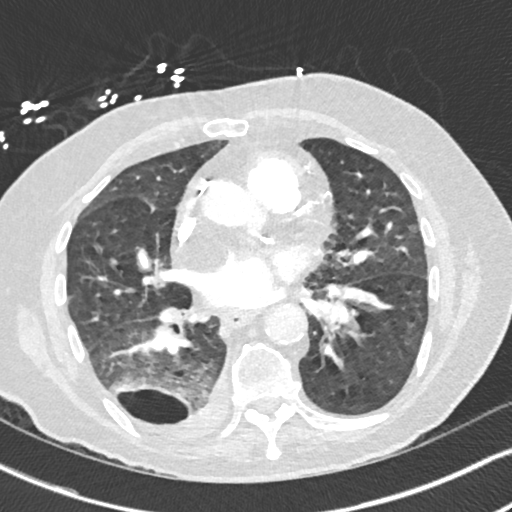
[im 162/291  mediastinal]
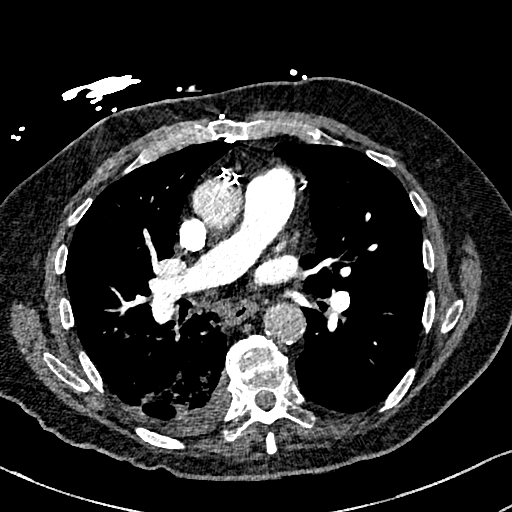
[im 178/291  lung]
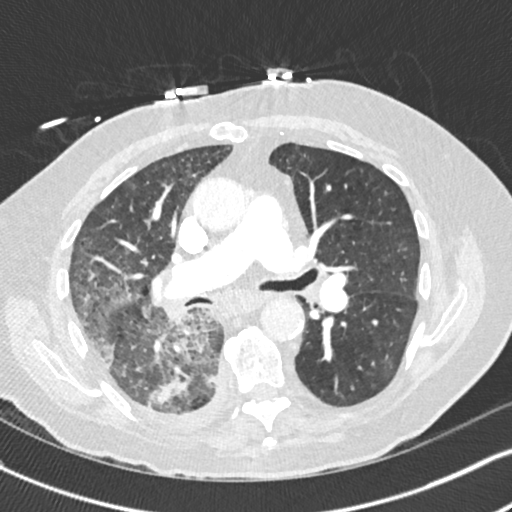
[im 194/291  mediastinal]
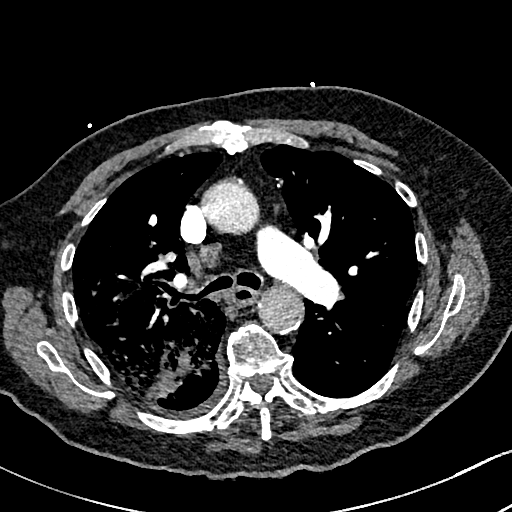
[im 210/291  lung]
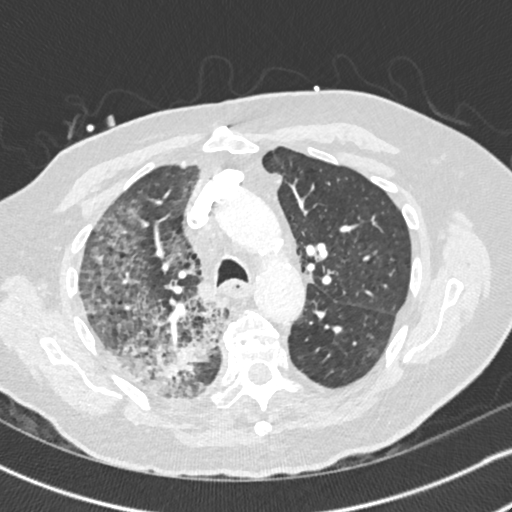
[im 226/291  mediastinal]
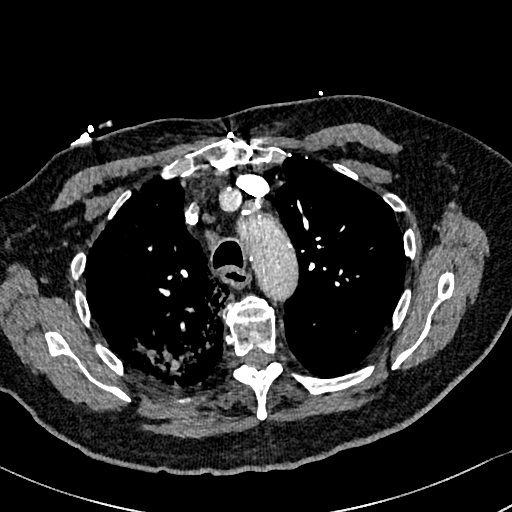
[im 242/291  lung]
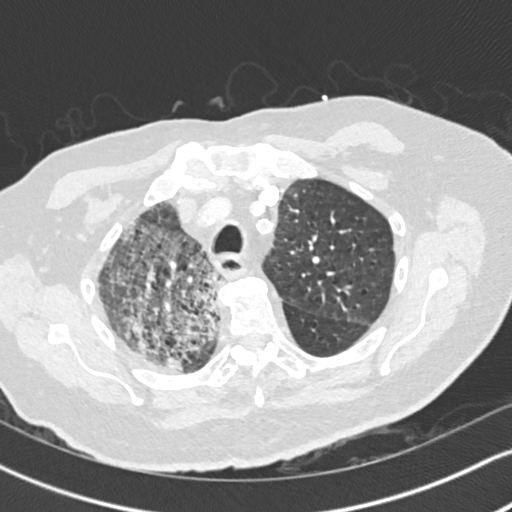
[im 258/291  mediastinal]
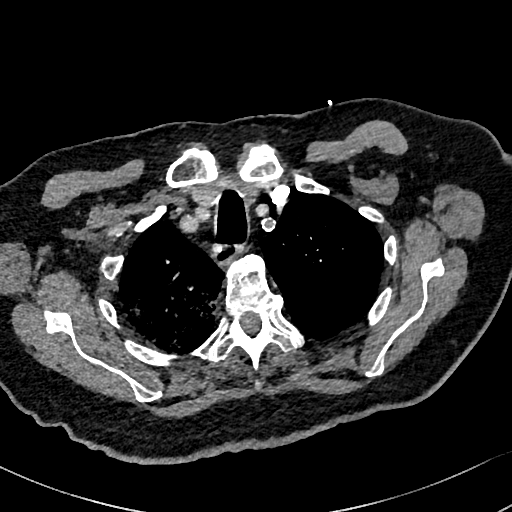
[im 274/291  lung]
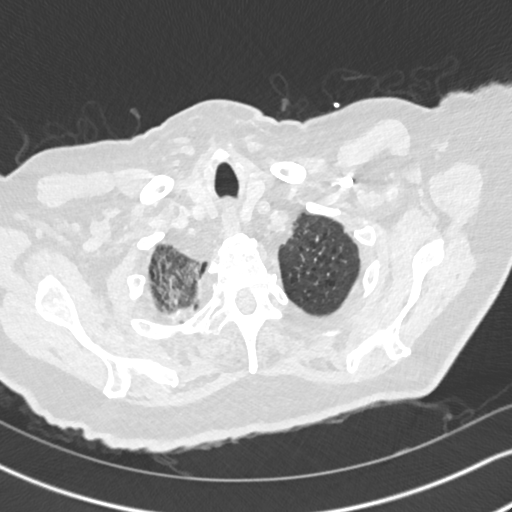

[Series 8: pe 2mm cor · coronal · 0.60mm/px · 1 of 151 slices shown]
[im 76/151  mediastinal]
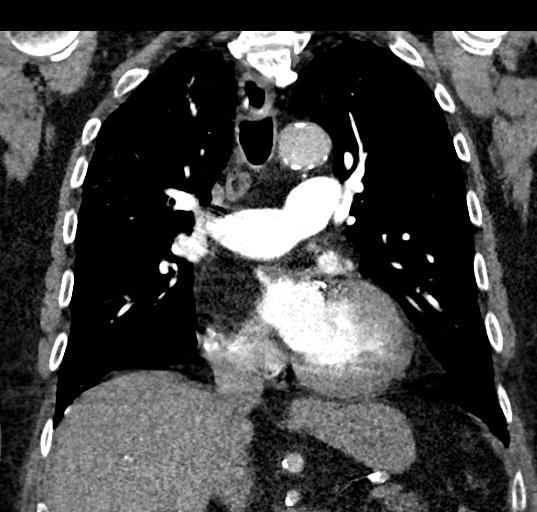

[18 of 36 positions shown; findings below may reference images not displayed]

FINDINGS: There is emphysematous changes of the lungs. There is a large area
of interstitial prominence and airspace density involving the right
upper lobe, right middle lobe, and right lower lobe most compatible
with pneumonia. A 2.0 x 3.5 cm more confluent density in the right
upper lobe (series 5, image 43) also most likely an area of
consolidation/infiltrate. Underlying mass is less likely but not
excluded. Clinical correlation is recommended. There is a small
right pleural effusion. A 1.9 x 4.8 cm right lower lobe subpleural
bleb noted. The left lung is clear. There is no pneumothorax. The
central airways are patent.

There is atherosclerotic calcification of the thoracic aorta. There
is no aneurysmal dilatation or evidence of dissection. There is a
vascular stent at the origin of the left subclavian artery.
Evaluation of the great vessels of the aortic arch and left
subclavian artery stent is limited due to timing of the contrast.
Evaluation of the pulmonary arteries is limited due to respiratory
motion artifact and suboptimal visualization of the peripheral
branches. No central pulmonary artery embolus identified. There is
no cardiomegaly or pericardial effusion. There is coronary vascular
calcification. CABG vascular clips noted. Set right hilar
adenopathy. There is no mediastinal adenopathy. The esophagus is
grossly unremarkable. No thyroid nodules identified.

There is no axillary adenopathy. The chest wall soft tissues appear
unremarkable. There is osteopenia with degenerative changes of the
spine. No acute fracture.

The visualized upper abdomen appear unremarkable.

Review of the MIP images confirms the above findings.
IMPRESSION: CT evidence of pulmonary embolism.

Large area of interstitial and airspace opacity involving the right
lung most compatible with pneumonia. Clinical correlation and
follow-up resolution recommended. Small right pleural effusion.

## 2017-07-26 ENCOUNTER — Ambulatory Visit: Payer: Medicare PPO | Admitting: Nurse Practitioner

## 2017-07-26 DIAGNOSIS — Z2089 Contact with and (suspected) exposure to other communicable diseases: Secondary | ICD-10-CM

## 2017-07-30 ENCOUNTER — Ambulatory Visit: Payer: Self-pay | Admitting: *Deleted

## 2017-07-30 ENCOUNTER — Encounter (HOSPITAL_COMMUNITY): Payer: Self-pay

## 2017-07-30 ENCOUNTER — Other Ambulatory Visit: Payer: Self-pay

## 2017-07-30 ENCOUNTER — Inpatient Hospital Stay (HOSPITAL_COMMUNITY)
Admission: EM | Admit: 2017-07-30 | Discharge: 2017-08-02 | DRG: 193 | Disposition: A | Discharge status: Home / Self Care | Payer: Medicare PPO | Source: Ambulatory Visit | Attending: Internal Medicine | Admitting: Internal Medicine

## 2017-07-30 ENCOUNTER — Emergency Department (HOSPITAL_COMMUNITY): Payer: Medicare PPO

## 2017-07-30 DIAGNOSIS — I2510 Atherosclerotic heart disease of native coronary artery without angina pectoris: Secondary | ICD-10-CM | POA: Diagnosis present

## 2017-07-30 DIAGNOSIS — E1122 Type 2 diabetes mellitus with diabetic chronic kidney disease: Secondary | ICD-10-CM | POA: Diagnosis present

## 2017-07-30 DIAGNOSIS — G4730 Sleep apnea, unspecified: Secondary | ICD-10-CM | POA: Diagnosis present

## 2017-07-30 DIAGNOSIS — Z79899 Other long term (current) drug therapy: Secondary | ICD-10-CM

## 2017-07-30 DIAGNOSIS — J449 Chronic obstructive pulmonary disease, unspecified: Secondary | ICD-10-CM | POA: Diagnosis present

## 2017-07-30 DIAGNOSIS — J050 Acute obstructive laryngitis [croup]: Secondary | ICD-10-CM | POA: Diagnosis not present

## 2017-07-30 DIAGNOSIS — I130 Hypertensive heart and chronic kidney disease with heart failure and stage 1 through stage 4 chronic kidney disease, or unspecified chronic kidney disease: Secondary | ICD-10-CM | POA: Diagnosis present

## 2017-07-30 DIAGNOSIS — J181 Lobar pneumonia, unspecified organism: Secondary | ICD-10-CM | POA: Diagnosis not present

## 2017-07-30 DIAGNOSIS — N400 Enlarged prostate without lower urinary tract symptoms: Secondary | ICD-10-CM | POA: Diagnosis present

## 2017-07-30 DIAGNOSIS — D631 Anemia in chronic kidney disease: Secondary | ICD-10-CM | POA: Diagnosis present

## 2017-07-30 DIAGNOSIS — F0390 Unspecified dementia without behavioral disturbance: Secondary | ICD-10-CM | POA: Diagnosis present

## 2017-07-30 DIAGNOSIS — I272 Pulmonary hypertension, unspecified: Secondary | ICD-10-CM | POA: Diagnosis not present

## 2017-07-30 DIAGNOSIS — H9193 Unspecified hearing loss, bilateral: Secondary | ICD-10-CM | POA: Diagnosis present

## 2017-07-30 DIAGNOSIS — Z888 Allergy status to other drugs, medicaments and biological substances status: Secondary | ICD-10-CM

## 2017-07-30 DIAGNOSIS — M7989 Other specified soft tissue disorders: Secondary | ICD-10-CM | POA: Diagnosis not present

## 2017-07-30 DIAGNOSIS — Z951 Presence of aortocoronary bypass graft: Secondary | ICD-10-CM | POA: Diagnosis not present

## 2017-07-30 DIAGNOSIS — N183 Chronic kidney disease, stage 3 unspecified: Secondary | ICD-10-CM | POA: Diagnosis present

## 2017-07-30 DIAGNOSIS — J189 Pneumonia, unspecified organism: Secondary | ICD-10-CM | POA: Diagnosis not present

## 2017-07-30 DIAGNOSIS — E875 Hyperkalemia: Secondary | ICD-10-CM | POA: Diagnosis present

## 2017-07-30 DIAGNOSIS — I10 Essential (primary) hypertension: Secondary | ICD-10-CM | POA: Diagnosis present

## 2017-07-30 DIAGNOSIS — E1165 Type 2 diabetes mellitus with hyperglycemia: Secondary | ICD-10-CM | POA: Diagnosis present

## 2017-07-30 DIAGNOSIS — IMO0002 Reserved for concepts with insufficient information to code with codable children: Secondary | ICD-10-CM | POA: Diagnosis present

## 2017-07-30 DIAGNOSIS — E7800 Pure hypercholesterolemia, unspecified: Secondary | ICD-10-CM | POA: Diagnosis not present

## 2017-07-30 DIAGNOSIS — I5033 Acute on chronic diastolic (congestive) heart failure: Secondary | ICD-10-CM | POA: Diagnosis present

## 2017-07-30 DIAGNOSIS — Z885 Allergy status to narcotic agent status: Secondary | ICD-10-CM

## 2017-07-30 DIAGNOSIS — Z7984 Long term (current) use of oral hypoglycemic drugs: Secondary | ICD-10-CM

## 2017-07-30 DIAGNOSIS — J441 Chronic obstructive pulmonary disease with (acute) exacerbation: Secondary | ICD-10-CM | POA: Diagnosis present

## 2017-07-30 DIAGNOSIS — J440 Chronic obstructive pulmonary disease with acute lower respiratory infection: Secondary | ICD-10-CM | POA: Diagnosis present

## 2017-07-30 DIAGNOSIS — Z7982 Long term (current) use of aspirin: Secondary | ICD-10-CM

## 2017-07-30 DIAGNOSIS — Z87891 Personal history of nicotine dependence: Secondary | ICD-10-CM

## 2017-07-30 DIAGNOSIS — E871 Hypo-osmolality and hyponatremia: Secondary | ICD-10-CM | POA: Diagnosis not present

## 2017-07-30 DIAGNOSIS — R079 Chest pain, unspecified: Secondary | ICD-10-CM | POA: Diagnosis not present

## 2017-07-30 DIAGNOSIS — M1990 Unspecified osteoarthritis, unspecified site: Secondary | ICD-10-CM | POA: Diagnosis present

## 2017-07-30 DIAGNOSIS — R0602 Shortness of breath: Secondary | ICD-10-CM | POA: Diagnosis not present

## 2017-07-30 DIAGNOSIS — I519 Heart disease, unspecified: Secondary | ICD-10-CM | POA: Diagnosis present

## 2017-07-30 HISTORY — DX: Pneumonia, unspecified organism: J18.9

## 2017-07-30 LAB — CBC
HEMATOCRIT: 37 % — AB (ref 39.0–52.0)
HEMOGLOBIN: 11.8 g/dL — AB (ref 13.0–17.0)
MCH: 31.2 pg (ref 26.0–34.0)
MCHC: 31.9 g/dL (ref 30.0–36.0)
MCV: 97.9 fL (ref 78.0–100.0)
Platelets: 243 10*3/uL (ref 150–400)
RBC: 3.78 MIL/uL — AB (ref 4.22–5.81)
RDW: 12.8 % (ref 11.5–15.5)
WBC: 10.7 10*3/uL — ABNORMAL HIGH (ref 4.0–10.5)

## 2017-07-30 LAB — MAGNESIUM: MAGNESIUM: 2.2 mg/dL (ref 1.7–2.4)

## 2017-07-30 LAB — BASIC METABOLIC PANEL
ANION GAP: 10 (ref 5–15)
BUN: 26 mg/dL — ABNORMAL HIGH (ref 6–20)
CALCIUM: 9.1 mg/dL (ref 8.9–10.3)
CO2: 23 mmol/L (ref 22–32)
Chloride: 102 mmol/L (ref 101–111)
Creatinine, Ser: 1.49 mg/dL — ABNORMAL HIGH (ref 0.61–1.24)
GFR calc non Af Amer: 40 mL/min — ABNORMAL LOW (ref >60)
GFR, EST AFRICAN AMERICAN: 47 mL/min — AB (ref >60)
Glucose, Bld: 250 mg/dL — ABNORMAL HIGH (ref 65–99)
Potassium: 5.2 mmol/L — ABNORMAL HIGH (ref 3.5–5.1)
Sodium: 135 mmol/L (ref 135–145)

## 2017-07-30 LAB — BRAIN NATRIURETIC PEPTIDE: B Natriuretic Peptide: 127.4 pg/mL — ABNORMAL HIGH (ref 0.0–100.0)

## 2017-07-30 LAB — INFLUENZA PANEL BY PCR (TYPE A & B)
INFLAPCR: NEGATIVE
INFLBPCR: NEGATIVE

## 2017-07-30 LAB — HEMOGLOBIN A1C
HEMOGLOBIN A1C: 7.7 % — AB (ref 4.8–5.6)
Mean Plasma Glucose: 174.29 mg/dL

## 2017-07-30 LAB — I-STAT TROPONIN, ED: TROPONIN I, POC: 0.02 ng/mL (ref 0.00–0.08)

## 2017-07-30 LAB — CBG MONITORING, ED: Glucose-Capillary: 186 mg/dL — ABNORMAL HIGH (ref 65–99)

## 2017-07-30 LAB — GLUCOSE, CAPILLARY: Glucose-Capillary: 335 mg/dL — ABNORMAL HIGH (ref 65–99)

## 2017-07-30 MED ORDER — INSULIN ASPART 100 UNIT/ML ~~LOC~~ SOLN
0.0000 [IU] | Freq: Every day | SUBCUTANEOUS | Status: DC
  Administered 2017-07-30 – 2017-08-01 (×3): 4 [IU] via SUBCUTANEOUS

## 2017-07-30 MED ORDER — SODIUM CHLORIDE 0.9 % IV SOLN
1.0000 g | Freq: Once | INTRAVENOUS | Status: AC
  Administered 2017-07-30: 1 g via INTRAVENOUS
  Filled 2017-07-30: qty 10

## 2017-07-30 MED ORDER — METHYLPREDNISOLONE SODIUM SUCC 125 MG IJ SOLR
60.0000 mg | Freq: Two times a day (BID) | INTRAMUSCULAR | Status: DC
  Administered 2017-07-30 – 2017-08-02 (×6): 60 mg via INTRAVENOUS
  Filled 2017-07-30 (×6): qty 2

## 2017-07-30 MED ORDER — LACTATED RINGERS IV SOLN
INTRAVENOUS | Status: DC
  Administered 2017-07-30: 20:00:00 via INTRAVENOUS

## 2017-07-30 MED ORDER — METOPROLOL SUCCINATE ER 100 MG PO TB24
100.0000 mg | ORAL_TABLET | Freq: Every day | ORAL | Status: DC
  Administered 2017-07-31 – 2017-08-02 (×3): 100 mg via ORAL
  Filled 2017-07-30 (×3): qty 1

## 2017-07-30 MED ORDER — ACETAMINOPHEN 650 MG RE SUPP: 650.0000 mg | Freq: Four times a day (QID) | RECTAL | Status: DC | PRN

## 2017-07-30 MED ORDER — ENOXAPARIN SODIUM 40 MG/0.4ML ~~LOC~~ SOLN
40.0000 mg | SUBCUTANEOUS | Status: DC
  Administered 2017-07-30 – 2017-08-01 (×3): 40 mg via SUBCUTANEOUS
  Filled 2017-07-30 (×4): qty 0.4

## 2017-07-30 MED ORDER — ATORVASTATIN CALCIUM 40 MG PO TABS
40.0000 mg | ORAL_TABLET | Freq: Every day | ORAL | Status: DC
  Administered 2017-07-31 – 2017-08-02 (×3): 40 mg via ORAL
  Filled 2017-07-30 (×3): qty 1

## 2017-07-30 MED ORDER — DOXAZOSIN MESYLATE 4 MG PO TABS
4.0000 mg | ORAL_TABLET | Freq: Every day | ORAL | Status: DC
Start: 2017-07-30 — End: 2017-08-02
  Administered 2017-07-30 – 2017-08-01 (×3): 4 mg via ORAL
  Filled 2017-07-30 (×5): qty 1

## 2017-07-30 MED ORDER — GUAIFENESIN ER 600 MG PO TB12: 600.0000 mg | ORAL_TABLET | Freq: Two times a day (BID) | ORAL | Status: DC

## 2017-07-30 MED ORDER — ADULT MULTIVITAMIN W/MINERALS CH
1.0000 | ORAL_TABLET | Freq: Every day | ORAL | Status: DC
  Administered 2017-07-31 – 2017-08-02 (×3): 1 via ORAL
  Filled 2017-07-30 (×3): qty 1

## 2017-07-30 MED ORDER — SODIUM CHLORIDE 0.9 % IV SOLN
500.0000 mg | INTRAVENOUS | Status: DC
  Administered 2017-07-31 – 2017-08-01 (×2): 500 mg via INTRAVENOUS
  Filled 2017-07-30 (×3): qty 500

## 2017-07-30 MED ORDER — SODIUM CHLORIDE 0.9 % IV SOLN
500.0000 mg | Freq: Once | INTRAVENOUS | Status: AC
  Administered 2017-07-30: 500 mg via INTRAVENOUS
  Filled 2017-07-30: qty 500

## 2017-07-30 MED ORDER — ONDANSETRON HCL 4 MG PO TABS
4.0000 mg | ORAL_TABLET | Freq: Four times a day (QID) | ORAL | Status: DC | PRN
  Filled 2017-07-30: qty 1

## 2017-07-30 MED ORDER — ALBUTEROL SULFATE (2.5 MG/3ML) 0.083% IN NEBU
5.0000 mg | INHALATION_SOLUTION | Freq: Once | RESPIRATORY_TRACT | Status: AC
  Administered 2017-07-30: 5 mg via RESPIRATORY_TRACT
  Filled 2017-07-30: qty 6

## 2017-07-30 MED ORDER — SODIUM CHLORIDE 0.9% FLUSH
3.0000 mL | Freq: Two times a day (BID) | INTRAVENOUS | Status: DC
  Administered 2017-07-31 – 2017-08-02 (×4): 3 mL via INTRAVENOUS

## 2017-07-30 MED ORDER — ONDANSETRON HCL 4 MG/2ML IJ SOLN: 4.0000 mg | Freq: Four times a day (QID) | INTRAMUSCULAR | Status: DC | PRN

## 2017-07-30 MED ORDER — PREDNISONE 20 MG PO TABS
60.0000 mg | ORAL_TABLET | Freq: Once | ORAL | Status: AC
  Administered 2017-07-30: 60 mg via ORAL
  Filled 2017-07-30: qty 3

## 2017-07-30 MED ORDER — POLYVINYL ALCOHOL 1.4 % OP SOLN
1.0000 [drp] | Freq: Four times a day (QID) | OPHTHALMIC | Status: DC
  Administered 2017-07-30 – 2017-08-02 (×11): 1 [drp] via OPHTHALMIC
  Filled 2017-07-30: qty 15

## 2017-07-30 MED ORDER — GLIPIZIDE 5 MG PO TABS: 5.0000 mg | ORAL_TABLET | Freq: Two times a day (BID) | ORAL | Status: DC

## 2017-07-30 MED ORDER — INSULIN ASPART 100 UNIT/ML ~~LOC~~ SOLN
0.0000 [IU] | Freq: Three times a day (TID) | SUBCUTANEOUS | Status: DC
  Administered 2017-07-30: 3 [IU] via SUBCUTANEOUS
  Administered 2017-07-31: 11 [IU] via SUBCUTANEOUS
  Administered 2017-07-31: 8 [IU] via SUBCUTANEOUS
  Administered 2017-07-31: 3 [IU] via SUBCUTANEOUS
  Administered 2017-08-01: 8 [IU] via SUBCUTANEOUS
  Administered 2017-08-01: 5 [IU] via SUBCUTANEOUS
  Administered 2017-08-01: 11 [IU] via SUBCUTANEOUS
  Administered 2017-08-02: 15 [IU] via SUBCUTANEOUS
  Administered 2017-08-02: 8 [IU] via SUBCUTANEOUS
  Filled 2017-07-30: qty 1

## 2017-07-30 MED ORDER — IPRATROPIUM-ALBUTEROL 0.5-2.5 (3) MG/3ML IN SOLN
3.0000 mL | Freq: Four times a day (QID) | RESPIRATORY_TRACT | Status: DC
  Administered 2017-07-30: 3 mL via RESPIRATORY_TRACT
  Filled 2017-07-30: qty 3

## 2017-07-30 MED ORDER — ACETAMINOPHEN 325 MG PO TABS
650.0000 mg | ORAL_TABLET | Freq: Four times a day (QID) | ORAL | Status: DC | PRN
  Administered 2017-07-30: 650 mg via ORAL
  Filled 2017-07-30: qty 2

## 2017-07-30 MED ORDER — SODIUM CHLORIDE 0.9 % IV SOLN
1.0000 g | INTRAVENOUS | Status: DC
  Administered 2017-07-31 – 2017-08-01 (×2): 1 g via INTRAVENOUS
  Filled 2017-07-30 (×3): qty 10

## 2017-07-30 MED ORDER — IPRATROPIUM-ALBUTEROL 0.5-2.5 (3) MG/3ML IN SOLN
3.0000 mL | Freq: Once | RESPIRATORY_TRACT | Status: AC
  Administered 2017-07-30: 3 mL via RESPIRATORY_TRACT
  Filled 2017-07-30: qty 3

## 2017-07-30 MED ORDER — ALBUTEROL SULFATE (2.5 MG/3ML) 0.083% IN NEBU: 2.5000 mg | INHALATION_SOLUTION | RESPIRATORY_TRACT | Status: DC | PRN

## 2017-07-30 NOTE — Telephone Encounter (Signed)
Pt reports right sided chest pain, constant, worsens with inspiration to 10/10, radiates to back.Onset Friday. H/O COPD, diastolic heart failure. Productive cough for small amounts clear "frothy" sputum. Wheezing,"worse at night." Reports SOB, worse with exertion, Speech halting, speaking in phrases. Denies fever, states pain "stays on my right side." Reports dizziness at times "Feels like I'll pass out and I have to sit back down." Pt directed to ED, son will drive.   Reason for Disposition . [1] MODERATE difficulty breathing (e.g., speaks in phrases, SOB even at rest, pulse 100-120) AND [2] NEW-onset or WORSE than normal  Answer Assessment - Initial Assessment Questions 1. LOCATION: "Where does it hurt?"       Right chest area 2. RADIATION: "Does the pain go anywhere else?" (e.g., into neck, jaw, arms, back)     To back 3. ONSET: "When did the chest pain begin?" (Minutes, hours or days)      Friday 4. PATTERN "Does the pain come and go, or has it been constant since it started?"  "Does it get worse with exertion?"     Worse with inspiration 5. DURATION: "How long does it last" (e.g., seconds, minutes, hours)     Constant 6. SEVERITY: "How bad is the pain?"  (e.g., Scale 1-10; mild, moderate, or severe)    - MILD (1-3): doesn't interfere with normal activities     - MODERATE (4-7): interferes with normal activities or awakens from sleep    - SEVERE (8-10): excruciating pain, unable to do any normal activities       10/10 7. CARDIAC RISK FACTORS: "Do you have any history of heart problems or risk factors for heart disease?" (e.g., prior heart attack, angina; high blood pressure, diabetes, being overweight, high cholesterol, smoking, or strong family history of heart disease)     *No Answer* 8. PULMONARY RISK FACTORS: "Do you have any history of lung disease?"  (e.g., blood clots in lung, asthma, emphysema, birth control pills)     *No Answer* 9. CAUSE: "What do you think is causing the chest  pain?"     My lungs 10. OTHER SYMPTOMS: "Do you have any other symptoms?" (e.g., dizziness, nausea, vomiting, sweating, fever, difficulty breathing, cough)       Sob  Answer Assessment - Initial Assessment Questions 1. RESPIRATORY STATUS: "Describe your breathing?" (e.g., wheezing, shortness of breath, unable to speak, severe coughing)      Wheezing, worse at night, SOB, pain with inspiration 2. ONSET: "When did this breathing problem begin?"     Friday 07/27/17 3. PATTERN "Does the difficult breathing come and go, or has it been constant since it started?"      Constant 4. SEVERITY: "How bad is your breathing?" (e.g., mild, moderate, severe)    - MILD: No SOB at rest, mild SOB with walking, speaks normally in sentences, can lay down, no retractions, pulse < 100.    - MODERATE: SOB at rest, SOB with minimal exertion and prefers to sit, cannot lie down flat, speaks in phrases, mild retractions, audible wheezing, pulse 100-120.    - SEVERE: Very SOB at rest, speaks in single words, struggling to breathe, sitting hunched forward, retractions, pulse > 120      Moderate 5. RECURRENT SYMPTOM: "Have you had difficulty breathing before?" If so, ask: "When was the last time?" and "What happened that time?"      H/O COPD 6. CARDIAC HISTORY: "Do you have any history of heart disease?" (e.g., heart attack, angina, bypass surgery, angioplasty)  CAD, diastolic heart failure 7. LUNG HISTORY: "Do you have any history of lung disease?"  (e.g., pulmonary embolus, asthma, emphysema)     COPD 8. CAUSE: "What do you think is causing the breathing problem?"      "My lungs" 9. OTHER SYMPTOMS: "Do you have any other symptoms? (e.g., dizziness, runny nose, cough, chest pain, fever)     Productive cough, dizziness, right sided chest pain with inspiration, radiates to back.  Protocols used: BREATHING DIFFICULTY-A-AH, CHEST PAIN-A-AH

## 2017-07-30 NOTE — ED Provider Notes (Signed)
[facility] Provider Note   CSN: [id number] Arrival date & time: 07/30/17  1054     History   Chief Complaint Chief Complaint  Patient presents with  . Cough  . Shortness of Breath    HPI Timothy Cordova is a 84 y.o. male.  Patient with cough and sputum production over the last 3 days.  Patient sent from primary care doctor's office for concern for pneumonia.  Patient using albuterol inhaler more than usual.  Has been compliant with other medications.  Patient feels increasingly short of breath when he walks. Given albuterol in triage and feels improved.   The history is provided by the patient.  Croup  This is a new problem. The current episode started more than 2 days ago. The problem occurs daily. The problem has been gradually worsening. Associated symptoms include chest pain and shortness of breath. Pertinent negatives include no abdominal pain and no headaches. The symptoms are aggravated by coughing. Relieved by: inhaler use. Treatments tried: inhalers. The treatment provided no relief.    Past Medical History:  Diagnosis Date  . Allergic rhinitis   . Benign prostatic hypertrophy   . Chronic diastolic heart failure (HCC)    8/08 ECHO with EF 55%  . CKD (chronic kidney disease)   . COPD (chronic obstructive pulmonary disease) (HCC)    moderate. followed by Dr.Byrum  . Coronary artery disease    s/p CABG 2006. LHC (1/10): SVG-OM and PLOM, SVG-D, and LIMA-LAD were all patent  . Dementia    (mild)--06/2006  . Diabetes mellitus   . Edema    localized. suspect diastolic CHF + venous insufficiency  . Hearing loss    left >>right  . History of tobacco abuse   . Hypercholesterolemia   . Hypertension   . Mixed hyperlipidemia   . Osteoarthritis   . Sleep apnea    ?    Patient Active Problem List   Diagnosis Date Noted  . CAP (community acquired pneumonia) 07/30/2017  . Coronary artery disease 07/30/2017  . Hypertension  07/30/2017  . COPD (chronic obstructive pulmonary disease) (HCC) 07/30/2017  . Hypercholesterolemia 07/30/2017  . CKD (chronic kidney disease), stage III (HCC) 07/30/2017  . Diabetes mellitus type 2, uncontrolled (HCC) 07/30/2017  . Left ventricular diastolic dysfunction 07/30/2017  . Pulmonary HTN (HCC) 07/30/2017  . Solitary pulmonary nodule 12/10/2015  . Anemia 11/26/2014  . Aortic calcification, 04/20/2005 CT Chest 04/03/2013  . Sleep apnea   . Tobacco abuse (in remission)   . Chronic low back pain 04/18/2011  . Renal lesion 03/27/2011  . CKD (chronic kidney disease) stage 3, GFR 30-59 ml/min (HCC) 08/18/2010  . OSTEOARTHRITIS 12/06/2009  . DIASTOLIC HEART FAILURE, CHRONIC 08/18/2009  . Mixed hyperlipidemia 06/11/2008  . CAD, AUTOLOGOUS BYPASS GRAFT 06/07/2008  . DM (diabetes mellitus), type 2 with renal complications (HCC) 09/04/2007  . ALLERGIC RHINITIS 01/30/2007  . HEARING LOSS 07/06/2006  . Essential hypertension 07/06/2006  . BENIGN PROSTATIC HYPERTROPHY 07/06/2006  . Centrilobular emphysema (HCC) 08/08/2005    Past Surgical History:  Procedure Laterality Date  . ANGIOPLASTY  [id number]  . CARDIAC [place]  90[place]  . CARDIAC CATHETERIZATION     [facility];x2 stents  . CARDIAC CATHETERIZATION  04/29/2013  . CORONARY ARTERY BYPASS GRAFT  02/2005  . ROTATOR CUFF REPAIR  2005   left, [place]        Home Medications    Prior to Admission medications   Medication Sig  Start Date End Date Taking? Authorizing Provider  albuterol (PROVENTIL HFA;VENTOLIN HFA) 108 (90 Base) MCG/ACT inhaler Inhale 2 puffs into the lungs every 6 (six) hours as needed for wheezing or shortness of breath. 10/09/15   Renae Fickle, MD  atorvastatin (LIPITOR) 40 MG tablet Take 40 mg by mouth daily.    [provider]  Carboxymethylcellulose Sod PF 0.25 % SOLN Place 1 drop into both eyes 4 (four) times daily.     [provider]    diphenhydramine-acetaminophen (TYLENOL PM) 25-500 MG TABS tablet Take 1 tablet by mouth at bedtime as needed (for sleep).     [provider]  doxazosin (CARDURA) 8 MG tablet Take 4 mg by mouth at bedtime.     [provider]  furosemide (LASIX) 40 MG tablet Take 20 mg by mouth daily.    [provider]  glipiZIDE (GLUCOTROL) 5 MG tablet Take 1 tablet (5 mg total) by mouth 2 (two) times daily before a meal. 07/19/16   Copland, Karleen Hampshire, MD  guaiFENesin (MUCINEX) 600 MG 12 hr tablet Take 1 tablet (600 mg total) by mouth 2 (two) times daily. 10/17/16   Glade Lloyd, MD  levofloxacin (LEVAQUIN) 500 MG tablet Take 1 tablet (500 mg total) by mouth daily. 12/20/16   Corwin Levins, MD  metoprolol (TOPROL-XL) 200 MG 24 hr tablet Take 100 mg by mouth daily.    [provider]  Multiple Vitamins-Minerals (CENTRUM SILVER 50+MEN) TABS Take 1 tablet by mouth daily.    [provider]  ONE TOUCH ULTRA TEST test strip USE TO CHECK BLOOD SUGAR 2 TIMES DAILY AS DIRECTED 09/08/15   Copland, Karleen Hampshire, MD  [facility] DELICA LANCETS 33G MISC  02/13/13   [provider]  predniSONE (DELTASONE) 10 MG tablet 3 tabs by mouth per day for 3 days,2tabs per day for 3 days,1tab per day for 3 days 12/20/16   Corwin Levins, MD  predniSONE (DELTASONE) 20 MG tablet 2 tabs po for 4 days, then 1 tab for 3 days 12/25/16   Copland, Karleen Hampshire, MD  Tiotropium Bromide-Olodaterol (STIOLTO RESPIMAT) 2.5-2.5 MCG/ACT AERS Inhale 2 puffs into the lungs every morning.     [provider]    Family History Family History  Problem Relation Age of Onset  . Heart attack Mother   . Lung cancer Sister   . Prostate cancer Neg Hx   . Kidney cancer Neg Hx     Social History Social History   Tobacco Use  . Smoking status: Former Smoker    Packs/day: 3.00    Years: 60.00    Pack years: 180.00    Types: Cigarettes    Last attempt to quit: 07/12/2005    Years since quitting: 12.0  .  Smokeless tobacco: Never Used  Substance Use Topics  . Alcohol use: No    Alcohol/week: 0.0 oz  . Drug use: No     Allergies   Metformin and related; Tramadol hcl; Codeine; Gabapentin; Morphine sulfate; Other; Ticlopidine hcl; Warfarin sodium; Ace inhibitors; Atorvastatin; and Tramadol   Review of Systems Review of Systems  Constitutional: Negative for chills and fever.  HENT: Negative for ear pain and sore throat.   Eyes: Negative for pain and visual disturbance.  Respiratory: Positive for shortness of breath and wheezing. Negative for cough.   Cardiovascular: Positive for chest pain and leg swelling. Negative for palpitations.  Gastrointestinal: Negative for abdominal pain and vomiting.  Genitourinary: Negative for dysuria and hematuria.  Musculoskeletal: Negative for  arthralgias and back pain.  Skin: Negative for color change and rash.  Neurological: Negative for seizures, syncope and headaches.  All other systems reviewed and are negative.    Physical Exam Updated Vital Signs  ED Triage Vitals  Enc Vitals Group     BP 07/30/17 1132 (!) 130/98     Pulse Rate 07/30/17 1132 75     Resp 07/30/17 1132 20     Temp 07/30/17 1132 98.2 F (36.8 C)     Temp Source 07/30/17 1132 Oral     SpO2 07/30/17 1132 95 %     Weight --      Height --      Head Circumference --      Peak Flow --      Pain Score 07/30/17 1136 7     Pain Loc --      Pain Edu? --      Excl. in GC? --     Physical Exam  Constitutional: He appears well-developed and well-nourished.  HENT:  Head: Normocephalic and atraumatic.  Mouth/Throat: No oropharyngeal exudate.  Eyes: Pupils are equal, round, and reactive to light. Conjunctivae and EOM are normal.  Neck: Normal range of motion. Neck supple.  Cardiovascular: Normal rate, regular rhythm, normal heart sounds and intact distal pulses.  No murmur heard. Pulmonary/Chest: Effort normal. Tachypnea noted. No respiratory distress. He has decreased breath  sounds. He has wheezes. He has rales.  Abdominal: Soft. There is no tenderness.  Musculoskeletal:       Right lower leg: He exhibits edema (2+).       Left lower leg: He exhibits edema (2+).  Neurological: He is alert.  Skin: Skin is warm and dry. Capillary refill takes less than 2 seconds.  Psychiatric: He has a normal mood and affect.  Nursing note and vitals reviewed.    ED Treatments / Results  Labs (all labs ordered are listed, but only abnormal results are displayed) Labs Reviewed  BASIC METABOLIC PANEL - Abnormal; Notable for the following components:      Result Value   Potassium 5.2 (*)    Glucose, Bld 250 (*)    BUN 26 (*)    Creatinine, Ser 1.49 (*)    GFR calc non Af Amer 40 (*)    GFR calc Af Amer 47 (*)    All other components within normal limits  CBC - Abnormal; Notable for the following components:   WBC 10.7 (*)    RBC 3.78 (*)    Hemoglobin 11.8 (*)    HCT 37.0 (*)    All other components within normal limits  CULTURE, BLOOD (ROUTINE X 2)  CULTURE, BLOOD (ROUTINE X 2)  RESPIRATORY PANEL BY PCR  CULTURE, EXPECTORATED SPUTUM-ASSESSMENT  GRAM STAIN  INFLUENZA PANEL BY PCR (TYPE A & B)  STREP PNEUMONIAE URINARY ANTIGEN  BRAIN NATRIURETIC PEPTIDE  MAGNESIUM  I-STAT TROPONIN, ED    EKG EKG Interpretation  Date/Time:  Monday July 30 2017 11:40:33 EDT Ventricular Rate:  79 PR Interval:  170 QRS Duration: 148 QT Interval:  388 QTC Calculation: 444 R Axis:   -62 Text Interpretation:  Sinus rhythm with marked sinus arrhythmia Left axis deviation Right bundle branch block Left ventricular hypertrophy with QRS widening Anterior infarct , age undetermined Abnormal ECG No significant change since last tracing Confirmed by Melene Plan [phone]) on 07/30/2017 3:58:36 PM   Radiology Dg Chest 2 View  Result Date: 07/30/2017 CLINICAL DATA:  Chest pain for 3 days, shortness  of breath EXAM: CHEST - 2 VIEW COMPARISON:  12/20/2016 FINDINGS: There is airspace disease  in the right upper lobe peripherally concerning for pneumonia. There is no pleural effusion or pneumothorax. The heart and mediastinal contours are unremarkable. The osseous structures are unremarkable. IMPRESSION: Right upper lobe pneumonia. Followup PA and lateral chest X-ray is recommended in 3-4 weeks following trial of antibiotic therapy to ensure resolution and exclude underlying malignancy. Electronically Signed   By: Elige Ko   On: 07/30/2017 12:37    Procedures Procedures (including critical care time)  Medications Ordered in ED Medications  cefTRIAXone (ROCEPHIN) 1 g in sodium chloride 0.9 % 100 mL IVPB (1 g Intravenous New Bag/Given 07/30/17 1635)  azithromycin (ZITHROMAX) 500 mg in sodium chloride 0.9 % 250 mL IVPB (500 mg Intravenous New Bag/Given 07/30/17 1637)  cefTRIAXone (ROCEPHIN) 1 g in sodium chloride 0.9 % 100 mL IVPB (has no administration in time range)  azithromycin (ZITHROMAX) 500 mg in sodium chloride 0.9 % 250 mL IVPB (has no administration in time range)  ipratropium-albuterol (DUONEB) 0.5-2.5 (3) MG/3ML nebulizer solution 3 mL (has no administration in time range)  methylPREDNISolone sodium succinate (SOLU-MEDROL) 125 mg/2 mL injection 60 mg (has no administration in time range)  atorvastatin (LIPITOR) tablet 40 mg (has no administration in time range)  Carboxymethylcellulose Sod PF 0.25 % SOLN 1 drop (has no administration in time range)  doxazosin (CARDURA) tablet 4 mg (has no administration in time range)  guaiFENesin (MUCINEX) 12 hr tablet 600 mg (has no administration in time range)  glipiZIDE (GLUCOTROL) tablet 5 mg (has no administration in time range)  metoprolol succinate (TOPROL-XL) 24 hr tablet 100 mg (has no administration in time range)  CENTRUM SILVER 50+MEN TABS 1 tablet (has no administration in time range)  enoxaparin (LOVENOX) injection 40 mg (has no administration in time range)  sodium chloride flush (NS) 0.9 % injection 3 mL (has no  administration in time range)  acetaminophen (TYLENOL) tablet 650 mg (has no administration in time range)    Or  acetaminophen (TYLENOL) suppository 650 mg (has no administration in time range)  ondansetron (ZOFRAN) tablet 4 mg (has no administration in time range)    Or  ondansetron (ZOFRAN) injection 4 mg (has no administration in time range)  albuterol (PROVENTIL) (2.5 MG/3ML) 0.083% nebulizer solution 5 mg (5 mg Nebulization Given 07/30/17 1147)  ipratropium-albuterol (DUONEB) 0.5-2.5 (3) MG/3ML nebulizer solution 3 mL (3 mLs Nebulization Given 07/30/17 1640)  predniSONE (DELTASONE) tablet 60 mg (60 mg Oral Given 07/30/17 1640)     Initial Impression / Assessment and Plan / ED Course  I have reviewed the triage vital signs and the nursing notes.  Pertinent labs & imaging results that were available during my care of the patient were reviewed by me and considered in my medical decision making (see chart for details).     Timothy Cordova is an 84 year old male with history of COPD, hypertension, heart failure, diabetes who presents to the ED with cough, sputum production.  Patient with unremarkable vitals upon arrival.  Patient seen by primary care provider today and concern for pneumonia possible COPD exacerbation.  Patient with different cough and increase in sputum production over the last 3 days.  Has had chills but no fevers.  Patient using his daily inhaler but has increasing use of his albuterol inhaler during that time. Patient states that he has leg swelling but that is fairly chronic.  Does not state chest pain at rest but mostly with  coughing.  Patient with some wheezing on exam.  Appears mildly tachypneic but overall well-appearing.  Patient has some swelling on his legs as well.  Labs were collected and chest x-ray that was done prior to my evaluation.  Patient a mild leukocytosis, chest x-ray showing concern for right upper lobe pneumonia.  However, otherwise labs unremarkable.   Patient with creatinine at baseline.  No other significant electrolyte abnormality.  EKG with T wave inversions throughout however unchanged from prior EKGs. No concern for ACS or PE given higher likely hood for pneumonia and COPD exacerbation. Patient given IV Rocephin and IV azithromycin. Blood cx collected. Will give prednisone and duonebs for COPD exacerbation.  Patient to be admitted for further care to hospitalist service.  Hemodynamically stable throughout my care.  Final Clinical Impressions(s) / ED Diagnoses   Final diagnoses:  Community acquired pneumonia of right upper lobe of lung [facility])  COPD exacerbation [facility])    ED Discharge Orders    None       Virgina Norfolk, DO 07/30/17 1642    Melene Plan, DO 07/30/17 1751    Melene Plan, DO 08/07/17 1344

## 2017-07-30 NOTE — H&P (Signed)
History and Physical    Timothy Cordova:[id number] DOB: [birth year] DOA: 07/30/2017  **Will admit patient based on the expectation that the patient will need hospitalization/ hospital care that crosses at least 2 midnights  PCP: Owens Loffler, MD   Attending physician: Lorin Mercy  Patient coming from/Resides with: Private residence  Chief Complaint: Shortness of breath and right-sided chest pain with productive cough.  HPI: Timothy Cordova is a 84 y.o. male with medical history significant for former tobacco abuse/COPD, chronic diastolic dysfunction, pulmonary hypertension, hypertension, stage III chronic kidney disease, diabetes on oral agents, CAD and dyslipidemia.  Patient reports that beginning this past Friday he had cough with shortness of breath and has subsequently developed significant chest discomfort especially with breathing on the right side.  On evaluation in the ER patient was normotensive, afebrile and not hypoxemic.  He was mildly hyperkalemic with a potassium of 5.2.  He had a leukocytosis 10,700, differential not obtained.  Chest x-ray revealed right upper lobe pneumonia.  Patient reports sick contact with wife's physical therapist that comes to the home sick with upper respiratory illness last week.  ED Course:  Vital Signs: BP 116/90   Pulse 75   Temp 97.8 F (36.6 C) (Oral)   Resp 20   SpO2 99%  CXR: As above Lab data: Sodium 135, potassium 5.2, chloride 102, CO2 23, glucose 250, BUN 26, creatinine 1.49, anion gap 10, POC troponin 0.02, white count 10,700 differential not obtained, hemoglobin 11.8, platelets 243,000 Medications and treatments: Albuterol neb 5 mg x1, DuoNeb 3 cc x1, prednisone 60 mg x1, Rocephin 1 g IV x1, Zithromax 500 mg IV x1  Review of Systems:  In addition to the HPI above,  No Fever-chills, myalgias or other constitutional symptoms No Headache, changes with Vision or hearing, new weakness, tingling, numbness in any extremity,  dizziness, dysarthria or word finding difficulty, gait disturbance or imbalance, tremors or seizure activity No problems swallowing food or Liquids, indigestion/reflux, choking or coughing while eating, abdominal pain with or after eating No palpitations, orthopnea  No Abdominal pain, N/V, melena,hematochezia, dark tarry stools, constipation No dysuria, malodorous urine, hematuria or flank pain No new skin rashes, lesions, masses or bruises, No new joint pains, aches, swelling or redness No recent unintentional weight gain or loss No polyuria, polydypsia or polyphagia   Past Medical History:  Diagnosis Date  . Allergic rhinitis   . Benign prostatic hypertrophy   . Chronic diastolic heart failure ([place])    8/08 ECHO with EF 55%  . CKD (chronic kidney disease)   . COPD (chronic obstructive pulmonary disease) ([facility])    moderate. followed by Dr.Byrum  . Coronary artery disease    s/p CABG 2006. LHC (1/10): SVG-OM and PLOM, SVG-D, and LIMA-LAD were all patent  . Dementia    (mild)--06/2006  . Diabetes mellitus   . Edema    localized. suspect diastolic CHF + venous insufficiency  . Hearing loss    left >>right  . History of tobacco abuse   . Hypercholesterolemia   . Hypertension   . Mixed hyperlipidemia   . Osteoarthritis   . Sleep apnea    ?    Past Surgical History:  Procedure Laterality Date  . ANGIOPLASTY  [id number]  . CARDIAC CATHETERIZATION  90s   [place], [place] x1stent  . CARDIAC CATHETERIZATION     [facility];x2 stents  . CARDIAC CATHETERIZATION  04/29/2013  . CORONARY ARTERY BYPASS GRAFT  02/2005  . ROTATOR CUFF REPAIR  2005  left, [place]    Social History   Socioeconomic History  . Marital status: Married    Spouse name: Not on file  . Number of children: Not on file  . Years of education: Not on file  . Highest education level: Not on file  Occupational History  . Occupation: Production designer, theatre/television/film: RETIRED    Comment: RETIRED  Social Needs  .  Financial resource strain: Not on file  . Food insecurity:    Worry: Not on file    Inability: Not on file  . Transportation needs:    Medical: Not on file    Non-medical: Not on file  Tobacco Use  . Smoking status: Former Smoker    Packs/day: 3.00    Years: 60.00    Pack years: 180.00    Types: Cigarettes    Last attempt to quit: 07/12/2005    Years since quitting: 12.0  . Smokeless tobacco: Never Used  Substance and Sexual Activity  . Alcohol use: No    Alcohol/week: 0.0 oz  . Drug use: No  . Sexual activity: Not on file  Lifestyle  . Physical activity:    Days per week: Not on file    Minutes per session: Not on file  . Stress: Not on file  Relationships  . Social connections:    Talks on phone: Not on file    Gets together: Not on file    Attends religious service: Not on file    Active member of club or organization: Not on file    Attends meetings of clubs or organizations: Not on file    Relationship status: Not on file  . Intimate partner violence:    Fear of current or ex partner: Not on file    Emotionally abused: Not on file    Physically abused: Not on file    Forced sexual activity: Not on file  Other Topics Concern  . Not on file  Social History Narrative  . Not on file    Mobility: Independent Work history: Not obtained   Allergies  Allergen Reactions  . Metformin And Related Nausea And Vomiting and Rash  . Tramadol Hcl Other (See Comments)    Tremor vs seizure activity  . Codeine Nausea And Vomiting  . Gabapentin Other (See Comments)    "Messed up my nervous system"  . Morphine Sulfate Nausea And Vomiting  . Other Nausea And Vomiting    "Narcotics, in general"  . Ticlopidine Hcl Other (See Comments)    Reaction unknown by patient or family  . Warfarin Sodium Nausea And Vomiting and Other (See Comments)    "Severe fatigue," also  . Ace Inhibitors Rash and Cough  . Atorvastatin Rash and Other (See Comments)    Makes the patient "weak"  .  Tramadol Rash    Family History  Problem Relation Age of Onset  . Heart attack Mother   . Lung cancer Sister   . Prostate cancer Neg Hx   . Kidney cancer Neg Hx      Prior to Admission medications   Medication Sig Start Date End Date Taking? Authorizing Provider  albuterol (PROVENTIL HFA;VENTOLIN HFA) 108 (90 Base) MCG/ACT inhaler Inhale 2 puffs into the lungs every 6 (six) hours as needed for wheezing or shortness of breath. 10/09/15   Janece Canterbury, MD  aspirin EC 81 MG tablet Take 81 mg by mouth daily.    [provider]  atorvastatin (LIPITOR) 40 MG tablet Take  40 mg by mouth daily.    [provider]  Carboxymethylcellulose Sod PF 0.25 % SOLN Place 1 drop into both eyes 4 (four) times daily.     [provider]  diphenhydramine-acetaminophen (TYLENOL PM) 25-500 MG TABS tablet Take 1 tablet by mouth at bedtime as needed (for sleep).     [provider]  doxazosin (CARDURA) 8 MG tablet Take 4 mg by mouth at bedtime.     [provider]  furosemide (LASIX) 40 MG tablet Take 20 mg by mouth daily.    [provider]  glipiZIDE (GLUCOTROL) 5 MG tablet Take 1 tablet (5 mg total) by mouth 2 (two) times daily before a meal. 07/19/16   Copland, Frederico Hamman, MD  guaiFENesin (MUCINEX) 600 MG 12 hr tablet Take 1 tablet (600 mg total) by mouth 2 (two) times daily. 10/17/16   Aline August, MD  levofloxacin (LEVAQUIN) 500 MG tablet Take 1 tablet (500 mg total) by mouth daily. 12/20/16   Biagio Borg, MD  metoprolol (TOPROL-XL) 200 MG 24 hr tablet Take 100 mg by mouth daily.    [provider]  Multiple Vitamins-Minerals (CENTRUM SILVER 50+MEN) TABS Take 1 tablet by mouth daily.    [provider]  ONE TOUCH ULTRA TEST test strip USE TO CHECK BLOOD SUGAR 2 TIMES DAILY AS DIRECTED 09/08/15   Copland, Frederico Hamman, MD  [facility] DELICA LANCETS [address]J [place]  02/13/13   [provider]  predniSONE (DELTASONE) 10 MG tablet 3 tabs by mouth  per day for 3 days,2tabs per day for 3 days,1tab per day for 3 days 12/20/16   Biagio Borg, MD  predniSONE (DELTASONE) 20 MG tablet 2 tabs po for 4 days, then 1 tab for 3 days 12/25/16   Copland, Frederico Hamman, MD  Tiotropium Bromide-Olodaterol (STIOLTO RESPIMAT) 2.5-2.5 MCG/ACT AERS Inhale 2 puffs into the lungs every morning.     [provider]    Physical Exam: Vitals:   07/30/17 1132 07/30/17 1521 07/30/17 1645  BP: (!) 130/98 (!) 160/79 116/90  Pulse: 75 79 75  Resp: _0 Temp: 98.2 F (36.8 C) 97.8 F (36.6 C)   TempSrc: Oral Oral   SpO2: 95% 97% 99%      Constitutional: NAD, calm, uncomfortable 2/2 ongoing right-sided pleuritic chest discomfort Eyes: PERRL, lids and conjunctivae normal ENMT: Mucous membranes are dry. Posterior pharynx clear of any exudate or lesions.Normal dentition.  Neck: normal, supple, no masses, no thyromegaly Respiratory: Coarse with rhonchi and scattered wheezes. Normal respiratory effort out accessory muscle use at rest. RA Cardiovascular: Regular rate and rhythm, no murmurs / rubs / gallops.  Trace bilateral lower extremity edema. 2+ pedal pulses. No carotid bruits.  Abdomen: no tenderness, no masses palpated. No hepatosplenomegaly. Bowel sounds positive.  Musculoskeletal: no clubbing / cyanosis. No joint deformity upper and lower extremities. Good ROM, no contractures. Normal muscle tone.  Skin: no rashes, lesions, ulcers. No induration Neurologic: CN 2-12 grossly intact. Sensation intact, DTR normal. Strength 5/5 x all 4 extremities.  Psychiatric: Normal judgment and insight. Alert and oriented x 3. Normal mood.    Labs on Admission: I have personally reviewed following labs and imaging studies  CBC: Recent Labs  Lab 07/30/17 1138  WBC 10.7*  HGB 11.8*  HCT 37.0*  MCV 97.9  PLT 478   Basic Metabolic Panel: Recent Labs  Lab 07/30/17 1138  NA 135  K 5.2*  CL 102  CO2 23  GLUCOSE 250*  BUN 26*  CREATININE 1.49*  CALCIUM  9.1   GFR: CrCl cannot be calculated (Unknown ideal weight.). Liver Function Tests: No results for input(s): AST, ALT, ALKPHOS, BILITOT, PROT, ALBUMIN in the last 168 hours. No results for input(s): LIPASE, AMYLASE in the last 168 hours. No results for input(s): AMMONIA in the last 168 hours. Coagulation Profile: No results for input(s): INR, PROTIME in the last 168 hours. Cardiac Enzymes: No results for input(s): CKTOTAL, CKMB, CKMBINDEX, TROPONINI in the last 168 hours. BNP (last 3 results) No results for input(s): PROBNP in the last 8760 hours. HbA1C: No results for input(s): HGBA1C in the last 72 hours. CBG: No results for input(s): GLUCAP in the last 168 hours. Lipid Profile: No results for input(s): CHOL, HDL, LDLCALC, TRIG, CHOLHDL, LDLDIRECT in the last 72 hours. Thyroid Function Tests: No results for input(s): TSH, T4TOTAL, FREET4, T3FREE, THYROIDAB in the last 72 hours. Anemia Panel: No results for input(s): VITAMINB12, FOLATE, FERRITIN, TIBC, IRON, RETICCTPCT in the last 72 hours. Urine analysis:    Component Value Date/Time   COLORURINE YELLOW 10/14/2016 2003   APPEARANCEUR Clear 11/20/2016 1409   LABSPEC 1.007 10/14/2016 2003   PHURINE 5.0 10/14/2016 2003   GLUCOSEU Negative 11/20/2016 1409   HGBUR MODERATE (A) 10/14/2016 2003   BILIRUBINUR Negative 11/20/2016 [place] 10/14/2016 2003   PROTEINUR Negative 11/20/2016 1409   PROTEINUR NEGATIVE 10/14/2016 2003   NITRITE Negative 11/20/2016 1409   NITRITE NEGATIVE 10/14/2016 2003   LEUKOCYTESUR Negative 11/20/2016 1409   Sepsis Labs: _0 (procalcitonin:4,lacticidven:4) )No results found for this or any previous visit (from the past 240 hour(s)).   Radiological Exams on Admission: Dg Chest 2 View  Result Date: 07/30/2017 CLINICAL DATA:  Chest pain for 3 days, shortness of breath EXAM: CHEST - 2 VIEW COMPARISON:  12/20/2016 FINDINGS: There is airspace disease in the right upper lobe  peripherally concerning for pneumonia. There is no pleural effusion or pneumothorax. The heart and mediastinal contours are unremarkable. The osseous structures are unremarkable. IMPRESSION: Right upper lobe pneumonia. Followup PA and lateral chest X-ray is recommended in 3-4 weeks following trial of antibiotic therapy to ensure resolution and exclude underlying malignancy. Electronically Signed   By: Kathreen Devoid   On: 07/30/2017 12:37    EKG: (Independently reviewed) sinus rhythm with ventricular rate 79 bpm, QTC 444 ms, right bundle branch block, abnormal R wave rotation, voltage criteria met for LVH  Assessment/Plan Principal Problem:   CAP (community acquired pneumonia) -Presents with cough for 3+ days with associated pleuritic chest pain, leukocytosis, sick contact and abnormal chest x-ray concerning for right-sided pneumonia -Continue IV Rocephin/Zithromax -Given prednisone 60 mg in ER-continue steroids Solu-Medrol 60 mg IV every 12 hours to begin 6 AM on 4/23 -Scheduled Duonebs -Oxygen prn based on O2 sats -Influenza PCR, respiratory viral panel, follow-up on blood cultures obtained in ER, pending sputum culture, urinary strep  Active Problems:   COPD (chronic obstructive pulmonary disease) / Pulmonary HTN  -Concomitant COPD exacerbation and associated with pneumonia process -Report of care as above    Hypertension/  Left ventricular diastolic dysfunction -Currently appears euvolemic and compensated -Continue beta-blocker -On ARB/ACE I prior to admission -Given presentation with respiratory symptoms obtain BNP -Daily weights, strict I's/O -Last echocardiogram was in 2018 with EF 60-65% with grade 1 diastolic dysfunction and pulmonary hypertension 42 mmHg    CKD (chronic kidney disease), stage III  -Renal function stable and at baseline -Mild hyperkalemia suspect related to volume depletion with patient reporting did take his  usual Lasix 20 mg this morning -Follow labs -May  require gentle IV fluid hydration    Diabetes mellitus type 2, uncontrolled  -Currently uncontrolled -Continue preadmission Glucotrol -Follow CBG -SSI    Coronary artery disease -Continue statin, beta-blocker    Hypercholesterolemia -Continue statin    **Additional lab, imaging and/or diagnostic evaluation at discretion of supervising physician  DVT prophylaxis: Lovenox Code Status: DO NOT INTUBATE Family Communication: Family at bedside Disposition Plan: Home Consults called: None    ELLIS,ALLISON L. ANP-BC [facility] Pager [phone]   If 7PM-7AM, please contact night-coverage [email or web address] Password [facility]  07/30/2017, 4:59 PM

## 2017-07-30 NOTE — ED Notes (Signed)
CBG 186; RN notified 

## 2017-07-30 NOTE — Telephone Encounter (Signed)
agree

## 2017-07-30 NOTE — ED Triage Notes (Addendum)
Pt presents to ED with complaints of right sided CP, productive cough, sob since Friday.Pt sent over from PCP for evaluation of possible pneumonia.

## 2017-07-30 NOTE — ED Notes (Signed)
[facility]

## 2017-07-30 NOTE — ED Notes (Signed)
[facility] healthy [facility] for the pt.

## 2017-07-30 NOTE — ED Notes (Addendum)
Pt's dinner tray arrived. 

## 2017-07-30 NOTE — ED Notes (Signed)
Pt ambulated to doorway and back, no assistance required. However, O2 saturation dropped from 95% to 84%. Significant shortness of breath noted.

## 2017-07-31 ENCOUNTER — Encounter (HOSPITAL_COMMUNITY): Payer: Self-pay | Admitting: General Practice

## 2017-07-31 ENCOUNTER — Inpatient Hospital Stay (HOSPITAL_COMMUNITY): Payer: Medicare PPO

## 2017-07-31 ENCOUNTER — Other Ambulatory Visit: Payer: Self-pay

## 2017-07-31 DIAGNOSIS — E875 Hyperkalemia: Secondary | ICD-10-CM

## 2017-07-31 DIAGNOSIS — J189 Pneumonia, unspecified organism: Secondary | ICD-10-CM

## 2017-07-31 DIAGNOSIS — J440 Chronic obstructive pulmonary disease with acute lower respiratory infection: Secondary | ICD-10-CM

## 2017-07-31 DIAGNOSIS — E1165 Type 2 diabetes mellitus with hyperglycemia: Secondary | ICD-10-CM

## 2017-07-31 DIAGNOSIS — E871 Hypo-osmolality and hyponatremia: Secondary | ICD-10-CM

## 2017-07-31 DIAGNOSIS — I272 Pulmonary hypertension, unspecified: Secondary | ICD-10-CM

## 2017-07-31 HISTORY — DX: Pneumonia, unspecified organism: J18.9

## 2017-07-31 LAB — BLOOD CULTURE ID PANEL (REFLEXED)
ACINETOBACTER BAUMANNII: NOT DETECTED
Candida albicans: NOT DETECTED
Candida glabrata: NOT DETECTED
Candida krusei: NOT DETECTED
Candida parapsilosis: NOT DETECTED
Candida tropicalis: NOT DETECTED
ENTEROBACTERIACEAE SPECIES: NOT DETECTED
ENTEROCOCCUS SPECIES: NOT DETECTED
ESCHERICHIA COLI: NOT DETECTED
Enterobacter cloacae complex: NOT DETECTED
HAEMOPHILUS INFLUENZAE: NOT DETECTED
Klebsiella oxytoca: NOT DETECTED
Klebsiella pneumoniae: NOT DETECTED
LISTERIA MONOCYTOGENES: NOT DETECTED
METHICILLIN RESISTANCE: NOT DETECTED
Neisseria meningitidis: NOT DETECTED
PSEUDOMONAS AERUGINOSA: NOT DETECTED
Proteus species: NOT DETECTED
SERRATIA MARCESCENS: NOT DETECTED
STAPHYLOCOCCUS AUREUS BCID: NOT DETECTED
STREPTOCOCCUS AGALACTIAE: NOT DETECTED
STREPTOCOCCUS PNEUMONIAE: NOT DETECTED
Staphylococcus species: DETECTED — AB
Streptococcus pyogenes: NOT DETECTED
Streptococcus species: NOT DETECTED

## 2017-07-31 LAB — CBC
HCT: 34 % — ABNORMAL LOW (ref 39.0–52.0)
HEMOGLOBIN: 10.7 g/dL — AB (ref 13.0–17.0)
MCH: 30.7 pg (ref 26.0–34.0)
MCHC: 31.5 g/dL (ref 30.0–36.0)
MCV: 97.7 fL (ref 78.0–100.0)
PLATELETS: 220 10*3/uL (ref 150–400)
RBC: 3.48 MIL/uL — ABNORMAL LOW (ref 4.22–5.81)
RDW: 12.6 % (ref 11.5–15.5)
WBC: 8.2 10*3/uL (ref 4.0–10.5)

## 2017-07-31 LAB — BASIC METABOLIC PANEL
ANION GAP: 8 (ref 5–15)
BUN: 35 mg/dL — AB (ref 6–20)
CHLORIDE: 102 mmol/L (ref 101–111)
CO2: 24 mmol/L (ref 22–32)
Calcium: 8.8 mg/dL — ABNORMAL LOW (ref 8.9–10.3)
Creatinine, Ser: 1.52 mg/dL — ABNORMAL HIGH (ref 0.61–1.24)
GFR calc Af Amer: 46 mL/min — ABNORMAL LOW (ref >60)
GFR, EST NON AFRICAN AMERICAN: 39 mL/min — AB (ref >60)
GLUCOSE: 331 mg/dL — AB (ref 65–99)
Potassium: 5.2 mmol/L — ABNORMAL HIGH (ref 3.5–5.1)
SODIUM: 134 mmol/L — AB (ref 135–145)

## 2017-07-31 LAB — GLUCOSE, CAPILLARY
GLUCOSE-CAPILLARY: 326 mg/dL — AB (ref 65–99)
Glucose-Capillary: 300 mg/dL — ABNORMAL HIGH (ref 65–99)
Glucose-Capillary: 277 mg/dL — ABNORMAL HIGH (ref 65–99)
Glucose-Capillary: 332 mg/dL — ABNORMAL HIGH (ref 65–99)

## 2017-07-31 LAB — PROCALCITONIN: PROCALCITONIN: 0.12 ng/mL

## 2017-07-31 MED ORDER — FUROSEMIDE 10 MG/ML IJ SOLN
60.0000 mg | Freq: Two times a day (BID) | INTRAMUSCULAR | Status: DC
  Administered 2017-07-31 – 2017-08-02 (×4): 60 mg via INTRAVENOUS
  Filled 2017-07-31 (×5): qty 6

## 2017-07-31 MED ORDER — IPRATROPIUM-ALBUTEROL 0.5-2.5 (3) MG/3ML IN SOLN
3.0000 mL | Freq: Three times a day (TID) | RESPIRATORY_TRACT | Status: DC
  Administered 2017-07-31 – 2017-08-02 (×7): 3 mL via RESPIRATORY_TRACT
  Filled 2017-07-31 (×7): qty 3

## 2017-07-31 NOTE — Progress Notes (Signed)
Inpatient Diabetes Program Recommendations  AACE/ADA: New Consensus Statement on Inpatient Glycemic Control (2015)  Target Ranges:  Prepandial:   less than 140 mg/dL      Peak postprandial:   less than 180 mg/dL (1-2 hours)      Critically ill patients:  140 - 180 mg/dL   Lab Results  Component Value Date   GLUCAP 326 (H) 07/31/2017   HGBA1C 7.7 (H) 07/30/2017    Review of Glycemic Control Results for Timothy Cordova, Timothy Cordova (MRN [id number]) as of 07/31/2017 09:27  Ref. Range 07/30/2017 22:45 07/31/2017 08:18  Glucose-Capillary Latest Ref Range: 65 - 99 mg/dL 478335 (H) 295326 (H)   Diabetes history: Type 2 DM Outpatient Diabetes medications: Glipizide 5 mg BID Current orders for Inpatient glycemic control: Novolog 0-15 units TID, Novolog 0-5 units QHS, Solumedrol 60 mg Q12H  Inpatient Diabetes Program Recommendations:    In the setting of steroids, may want to consider Lantus 10 units QHS. Expect insulin needs to decrease once steroids are tapered.   Thanks, Lujean RaveLauren Reana Chacko, MSN, RNC-OB Diabetes Coordinator [phone] (8a-5p)

## 2017-07-31 NOTE — Progress Notes (Signed)
Timothy Cordova, Timothy Cordova 84 y o male patient received from ED. Patient is alert and oriented x 4. Vital signs are stable. Skin assessment done with another nurse. Iv in place. Patient given instruction about call bell and phone. Bed in low position and side rail up x3. Call bell in reach.

## 2017-07-31 NOTE — Progress Notes (Signed)
PROGRESS NOTE    Timothy Cordova  HQI:[id number] DOB: 31-May-1928 DOA: 07/30/2017 PCP: Hannah Beat, MD  Outpatient Specialists:     Brief Narrative: Patient is an 84 y.o. male with past medical history significant for COPD, tobacco use (reformed), chronic diastolic dysfunction, pulmonary hypertension, hypertension, stage III chronic kidney disease, diabetes on oral agents, CAD and dyslipidemia.  According to the patient, he was treated for pneumonia earlier in the year but never really returned to his baseline.  Patient reports shortness of breath, dyspnea on exertion with minimal exertion.  Patient has bilateral lower leg edema, but this may be chronic, considering pulmonary hypertension.  Patient only reports 1 pillow orthopnea.  According to the patient, he smoked for 60 years before quitting.  Fever and coughing documented on H&P.  Currently, patient is afebrile.  Cough is said to be productive of whitish phlegm.  Patient is currently on Rocephin and azithromycin.  Patient is also on treatment for COPD exacerbation.  Will add IV diuretics.  Further management will depend on hospital course.   Assessment & Plan:   Principal Problem:   CAP (community acquired pneumonia) Active Problems:   Coronary artery disease   Hypertension   COPD (chronic obstructive pulmonary disease) (HCC)   Hypercholesterolemia   CKD (chronic kidney disease), stage III (HCC)   Diabetes mellitus type 2, uncontrolled (HCC)   Left ventricular diastolic dysfunction   Pulmonary HTN (HCC)   CAP (community acquired pneumonia) -Presents with cough for 3+ days with associated pleuritic chest pain, leukocytosis, sick contact and abnormal chest x-ray concerning for right-sided pneumonia -Continue IV Rocephin/Zithromax -Given prednisone 60 mg in ER-continue steroids Solu-Medrol 60 mg IV every 12 hours to begin 6 AM on 4/23 -Scheduled Duonebs -Oxygen prn based on O2 sats -Influenza PCR, respiratory viral panel,  follow-up on blood cultures obtained in ER, pending sputum culture, urinary strep -07/31/2017: CT chest without contrast.  Continue antibiotics for now.  COPD (chronic obstructive pulmonary disease) / Pulmonary HTN  -Concomitant COPD exacerbation and associated with pneumonia process -Report of care as above -07/31/2017: Continue steroids and nebulizer treatment.  Hypertension/  Left ventricular diastolic dysfunction -Currently appears euvolemic and compensated -Continue beta-blocker -On ARB/ACE I prior to admission -Given presentation with respiratory symptoms obtain BNP -Daily weights, strict I's/O -Last echocardiogram was in 2018 with EF 60-65% with grade 1 diastolic dysfunction and pulmonary hypertension 42 mmHg  CKD (chronic kidney disease), stage III  -Renal function stable and at baseline -Mild hyperkalemia suspect related to volume depletion with patient reporting did take his usual Lasix 20 mg this morning -Follow labs -07/31/2017-mild hyperkalemia will improve with diuresis. -Monitor renal function and electrolytes closely.  Diabetes mellitus type 2, uncontrolled  -Currently uncontrolled -Continue preadmission Glucotrol -Follow CBG -SSI  Coronary artery disease -Continue statin, beta-blocker  Hypercholesterolemia -Continue statin  DVT prophylaxis: Lovenox Code Status: DO NOT INTUBATE Family Communication:  Disposition Plan: Home Consults called: None  Antimicrobials:   Rocephin  Azithromycin   Subjective: Patient reports shortness of breath and dyspnea on exertion with minimal exertion.  No fever or chills Cough is productive of whitish sputum.  Objective: Vitals:   07/31/17 0643 07/31/17 0826 07/31/17 1309 07/31/17 1412  BP: (!) 144/55  (!) 141/52   Pulse:   70   Resp:   18   Temp:      TempSrc:      SpO2:  90% 93% 95%  Weight:      Height:        Intake/Output  Summary (Last 24 hours) at 07/31/2017 1539 Last data filed at 07/31/2017  1610 Gross per 24 hour  Intake 738.33 ml  Output 200 ml  Net 538.33 ml   Filed Weights   07/31/17 0500  Weight: 81.7 kg (180 lb 1.9 oz)    Examination:  General exam: Appears calm and comfortable  Respiratory system: Decreased air entry. Inspiratory and expiratory wheeze. Cardiovascular system: S1 & S2 heard. Gastrointestinal system: Abdomen is obese, soft and nontender.  Organs are difficult to assess.   Central nervous system: Alert and oriented. No focal neurological deficits. Extremities: Bilateral lower extremity edema.  Data Reviewed: I have personally reviewed following labs and imaging studies  CBC: Recent Labs  Lab 07/30/17 1138 07/31/17 0502  WBC 10.7* 8.2  HGB 11.8* 10.7*  HCT 37.0* 34.0*  MCV 97.9 97.7  PLT 243 220   Basic Metabolic Panel: Recent Labs  Lab 07/30/17 1138 07/30/17 1619 07/31/17 0502  NA 135  --  134*  K 5.2*  --  5.2*  CL 102  --  102  CO2 23  --  24  GLUCOSE 250*  --  331*  BUN 26*  --  35*  CREATININE 1.49*  --  1.52*  CALCIUM 9.1  --  8.8*  MG  --  2.2  --    GFR: Estimated Creatinine Clearance: 35 mL/min (A) (by C-G formula based on SCr of 1.52 mg/dL (H)). Liver Function Tests: No results for input(s): AST, ALT, ALKPHOS, BILITOT, PROT, ALBUMIN in the last 168 hours. No results for input(s): LIPASE, AMYLASE in the last 168 hours. No results for input(s): AMMONIA in the last 168 hours. Coagulation Profile: No results for input(s): INR, PROTIME in the last 168 hours. Cardiac Enzymes: No results for input(s): CKTOTAL, CKMB, CKMBINDEX, TROPONINI in the last 168 hours. BNP (last 3 results) No results for input(s): PROBNP in the last 8760 hours. HbA1C: Recent Labs    07/30/17 1659  HGBA1C 7.7*   CBG: Recent Labs  Lab 07/30/17 1745 07/30/17 2245 07/31/17 0818 07/31/17 1152  GLUCAP 186* 335* 326* 300*   Lipid Profile: No results for input(s): CHOL, HDL, LDLCALC, TRIG, CHOLHDL, LDLDIRECT in the last 72 hours. Thyroid  Function Tests: No results for input(s): TSH, T4TOTAL, FREET4, T3FREE, THYROIDAB in the last 72 hours. Anemia Panel: No results for input(s): VITAMINB12, FOLATE, FERRITIN, TIBC, IRON, RETICCTPCT in the last 72 hours. Urine analysis:    Component Value Date/Time   COLORURINE YELLOW 10/14/2016 2003   APPEARANCEUR Clear 11/20/2016 1409   LABSPEC 1.007 10/14/2016 2003   PHURINE 5.0 10/14/2016 2003   GLUCOSEU Negative 11/20/2016 1409   HGBUR MODERATE (A) 10/14/2016 2003   BILIRUBINUR Negative 11/20/2016 1409   KETONESUR NEGATIVE 10/14/2016 2003   PROTEINUR Negative 11/20/2016 1409   PROTEINUR NEGATIVE 10/14/2016 2003   NITRITE Negative 11/20/2016 1409   NITRITE NEGATIVE 10/14/2016 2003   LEUKOCYTESUR Negative 11/20/2016 1409   Sepsis Labs: @LABRCNTIP (procalcitonin:4,lacticidven:4)  ) Recent Results (from the past 240 hour(s))  Blood culture (routine x 2)     Status: None (Preliminary result)   Collection Time: 07/30/17  3:56 PM  Result Value Ref Range Status   Specimen Description BLOOD RIGHT ANTECUBITAL  Final   Special Requests   Final    BOTTLES DRAWN AEROBIC AND ANAEROBIC Blood Culture adequate volume   Culture  Setup Time   Final    GRAM POSITIVE COCCI AEROBIC BOTTLE ONLY Organism ID to follow CRITICAL RESULT CALLED TO, READ BACK BY AND VERIFIED  WITH: Patrick North PharmD 14:50 07/31/17 ([email or web address]) Performed at [facility], [address] [place], [place] [address]    Culture GRAM POSITIVE COCCI  Final   Report Status PENDING  Incomplete  Blood Culture ID Panel (Reflexed)     Status: Abnormal   Collection Time: 07/30/17  3:56 PM  Result Value Ref Range Status   Enterococcus species NOT DETECTED NOT DETECTED Final   Listeria monocytogenes NOT DETECTED NOT DETECTED Final   Staphylococcus species DETECTED (A) NOT DETECTED Final    Comment: Methicillin (oxacillin) susceptible coagulase negative staphylococcus. Possible blood culture contaminant (unless isolated from  more than one blood culture draw or clinical case suggests pathogenicity). No antibiotic treatment is indicated for blood  culture contaminants. CRITICAL RESULT CALLED TO, READ BACK BY AND VERIFIED WITH: Patrick North PharmD 14:50 07/31/17 ([email or web address])    Staphylococcus aureus NOT DETECTED NOT DETECTED Final   Methicillin resistance NOT DETECTED NOT DETECTED Final   Streptococcus species NOT DETECTED NOT DETECTED Final   Streptococcus agalactiae NOT DETECTED NOT DETECTED Final   Streptococcus pneumoniae NOT DETECTED NOT DETECTED Final   Streptococcus pyogenes NOT DETECTED NOT DETECTED Final   Acinetobacter baumannii NOT DETECTED NOT DETECTED Final   Enterobacteriaceae species NOT DETECTED NOT DETECTED Final   Enterobacter cloacae complex NOT DETECTED NOT DETECTED Final   Escherichia coli NOT DETECTED NOT DETECTED Final   Klebsiella oxytoca NOT DETECTED NOT DETECTED Final   Klebsiella pneumoniae NOT DETECTED NOT DETECTED Final   Proteus species NOT DETECTED NOT DETECTED Final   Serratia marcescens NOT DETECTED NOT DETECTED Final   Haemophilus influenzae NOT DETECTED NOT DETECTED Final   Neisseria meningitidis NOT DETECTED NOT DETECTED Final   Pseudomonas aeruginosa NOT DETECTED NOT DETECTED Final   Candida albicans NOT DETECTED NOT DETECTED Final   Candida glabrata NOT DETECTED NOT DETECTED Final   Candida krusei NOT DETECTED NOT DETECTED Final   Candida parapsilosis NOT DETECTED NOT DETECTED Final   Candida tropicalis NOT DETECTED NOT DETECTED Final    Comment: Performed at [facility], [address] [place], [place] [address]  Blood culture (routine x 2)     Status: None (Preliminary result)   Collection Time: 07/30/17  4:01 PM  Result Value Ref Range Status   Specimen Description BLOOD LEFT ANTECUBITAL  Final   Special Requests   Final    BOTTLES DRAWN AEROBIC AND ANAEROBIC Blood Culture adequate volume   Culture   Final    NO GROWTH < 24 HOURS Performed at [facility], [address], [place], [place] [address]    Report Status PENDING  Incomplete         Radiology Studies: Dg Chest 2 View  Result Date: 07/30/2017 CLINICAL DATA:  Chest pain for 3 days, shortness of breath EXAM: CHEST - 2 VIEW COMPARISON:  12/20/2016 FINDINGS: There is airspace disease in the right upper lobe peripherally concerning for pneumonia. There is no pleural effusion or pneumothorax. The heart and mediastinal contours are unremarkable. The osseous structures are unremarkable. IMPRESSION: Right upper lobe pneumonia. Followup PA and lateral chest X-ray is recommended in 3-4 weeks following trial of antibiotic therapy to ensure resolution and exclude underlying malignancy. Electronically Signed   By: Elige Ko   On: 07/30/2017 12:37        Scheduled Meds: . atorvastatin  40 mg Oral Daily  . doxazosin  4 mg Oral QHS  . enoxaparin (LOVENOX) injection  40 mg Subcutaneous Q24H  .  insulin aspart  0-15 Units Subcutaneous TID WC  . insulin aspart  0-5 Units Subcutaneous QHS  . ipratropium-albuterol  3 mL Nebulization TID  . methylPREDNISolone (SOLU-MEDROL) injection  60 mg Intravenous Q12H  . metoprolol  100 mg Oral Daily  . multivitamin with minerals  1 tablet Oral Daily  . polyvinyl alcohol  1 drop Both Eyes QID  . sodium chloride flush  3 mL Intravenous Q12H   Continuous Infusions: . azithromycin    . cefTRIAXone (ROCEPHIN)  IV    . lactated ringers 50 mL/hr at 07/30/17 1936     LOS: 1 day    Time spent: 35 minutes.    Berton MountSylvester Kassia Demarinis, MD  [facility] Pager #: [phone] 7PM-7AM contact night coverage as above

## 2017-07-31 NOTE — Consult Note (Signed)
   [facility] CM Inpatient Consult   07/31/2017  Timothy Cordova December 20, 1928 [id number]  Patient screened for high risk score for unplanned readmission and for restart of [place] [facility] services. Patient is in the [place] of the [place] [facility] services under patient's [facility] plan. Met with the patient and wife at the bedside. Patient admitted with COPD exacerbation. Patient inquired about denied claims.  Explained that [facility] works with patient for chronic disease management and to contact the customer services department on his EOB for further information regarding his bills.  Patient verbalized understanding. Offered [facility] Management for post hospital follow up.  Patient states, "I prefer speaking with a live person and not a machine."  Patient denies any needs at this time. Requested information.  Gave the patient a 24 hour nurse advice line magnet, brochure and contact information.  For questions contact:   Natividad Brood, RN BSN [facility] Liaison  [phone] business mobile phone Toll free office [phone]

## 2017-07-31 NOTE — Progress Notes (Signed)
PHARMACY - PHYSICIAN COMMUNICATION CRITICAL VALUE ALERT - BLOOD CULTURE IDENTIFICATION (BCID)  Timothy Cordova is an 84 y.o. male who [facility] with a chief complaint of SOB, cough, and R sided CP.   Assessment:   Diagnosed with probably CAP. On abx. Afebrile, WBC wnl. Now with 1/4 bottles positive with staph species. Probably contaminant.  Name of physician (or Provider) Contacted: S. Ogbata  Current antibiotics: Ceftriaxone and azithromycin  Changes to prescribed antibiotics recommended:  No changes needed. Continue current abx and de-escalate as able   Results for orders placed or performed during the hospital encounter of 07/30/17  Blood Culture ID Panel (Reflexed) (Collected: 07/30/2017  3:56 PM)  Result Value Ref Range   Enterococcus species NOT DETECTED NOT DETECTED   Listeria monocytogenes NOT DETECTED NOT DETECTED   Staphylococcus species DETECTED (A) NOT DETECTED   Staphylococcus aureus NOT DETECTED NOT DETECTED   Methicillin resistance NOT DETECTED NOT DETECTED   Streptococcus species NOT DETECTED NOT DETECTED   Streptococcus agalactiae NOT DETECTED NOT DETECTED   Streptococcus pneumoniae NOT DETECTED NOT DETECTED   Streptococcus pyogenes NOT DETECTED NOT DETECTED   Acinetobacter baumannii NOT DETECTED NOT DETECTED   Enterobacteriaceae species NOT DETECTED NOT DETECTED   Enterobacter cloacae complex NOT DETECTED NOT DETECTED   Escherichia coli NOT DETECTED NOT DETECTED   Klebsiella oxytoca NOT DETECTED NOT DETECTED   Klebsiella pneumoniae NOT DETECTED NOT DETECTED   Proteus species NOT DETECTED NOT DETECTED   Serratia marcescens NOT DETECTED NOT DETECTED   Haemophilus influenzae NOT DETECTED NOT DETECTED   Neisseria meningitidis NOT DETECTED NOT DETECTED   Pseudomonas aeruginosa NOT DETECTED NOT DETECTED   Candida albicans NOT DETECTED NOT DETECTED   Candida glabrata NOT DETECTED NOT DETECTED   Candida krusei NOT DETECTED NOT DETECTED    Candida parapsilosis NOT DETECTED NOT DETECTED   Candida tropicalis NOT DETECTED NOT DETECTED    Timothy Cordova,Timothy Cordova 07/31/2017  2:57 PM

## 2017-08-01 DIAGNOSIS — E7800 Pure hypercholesterolemia, unspecified: Secondary | ICD-10-CM

## 2017-08-01 LAB — RENAL FUNCTION PANEL
Albumin: 2.6 g/dL — ABNORMAL LOW (ref 3.5–5.0)
Anion gap: 11 (ref 5–15)
BUN: 40 mg/dL — ABNORMAL HIGH (ref 6–20)
CO2: 23 mmol/L (ref 22–32)
Calcium: 9.2 mg/dL (ref 8.9–10.3)
Chloride: 102 mmol/L (ref 101–111)
Creatinine, Ser: 1.45 mg/dL — ABNORMAL HIGH (ref 0.61–1.24)
GFR calc Af Amer: 48 mL/min — ABNORMAL LOW (ref >60)
GFR calc non Af Amer: 42 mL/min — ABNORMAL LOW (ref >60)
Glucose, Bld: 312 mg/dL — ABNORMAL HIGH (ref 65–99)
Phosphorus: 3 mg/dL (ref 2.5–4.6)
Potassium: 5 mmol/L (ref 3.5–5.1)
Sodium: 136 mmol/L (ref 135–145)

## 2017-08-01 LAB — GLUCOSE, CAPILLARY
GLUCOSE-CAPILLARY: 249 mg/dL — AB (ref 65–99)
GLUCOSE-CAPILLARY: 313 mg/dL — AB (ref 65–99)
GLUCOSE-CAPILLARY: 334 mg/dL — AB (ref 65–99)
Glucose-Capillary: 252 mg/dL — ABNORMAL HIGH (ref 65–99)

## 2017-08-01 LAB — CORTISOL: Cortisol, Plasma: 4 ug/dL

## 2017-08-01 MED ORDER — SODIUM POLYSTYRENE SULFONATE 15 GM/60ML PO SUSP
15.0000 g | Freq: Once | ORAL | Status: AC
Start: 2017-08-01 — End: 2017-08-01
  Administered 2017-08-01: 15 g via ORAL
  Filled 2017-08-01: qty 60

## 2017-08-01 NOTE — Progress Notes (Signed)
Inpatient Diabetes Program Recommendations  AACE/ADA: New Consensus Statement on Inpatient Glycemic Control (2015)  Target Ranges:  Prepandial:   less than 140 mg/dL      Peak postprandial:   less than 180 mg/dL (1-2 hours)      Critically ill patients:  140 - 180 mg/dL   Lab Results  Component Value Date   GLUCAP 249 (H) 08/01/2017   HGBA1C 7.7 (H) 07/30/2017    Review of Glycemic Control Results for Ermalene PostinHARRELSON, Cleophas O (MRN [id number]) as of 08/01/2017 10:23  Ref. Range 07/31/2017 11:52 07/31/2017 16:35 07/31/2017 21:38 08/01/2017 07:39  Glucose-Capillary Latest Ref Range: 65 - 99 mg/dL 409300 (H) 811277 (H) 914332 (H) 249 (H)   Diabetes history: Type 2 DM Outpatient Diabetes medications: Glipizide 5 mg BID Current orders for Inpatient glycemic control: Novolog 0-15 units TID, Novolog 0-5 units QHS, Solumedrol 60 mg Q12H  Inpatient Diabetes Program Recommendations:    In the setting of steroids, may want to consider Lantus 10 units QHS. If post prandials continue to exceed 180 mg/DL, consider Novolog 4 units TID (assuming patient is eating >50%). Expect insulin needs to decrease once steroids are tapered. Text page sent.  Thanks, Lujean RaveLauren Danylah Holden, MSN, RNC-OB Diabetes Coordinator [phone] (8a-5p)

## 2017-08-01 NOTE — Progress Notes (Signed)
PROGRESS NOTE  Timothy Cordova:[id number] DOB: August 02, 1928 DOA: 07/30/2017 PCP: Hannah Beat, MD  HPI/Recap of past 24 hours: Patient is an 84 y.o.malewith past medical history significant forCOPD, tobacco use (reformed), chronic diastolic dysfunction, pulmonary hypertension, hypertension, stage III chronic kidney disease, diabetes on oral agents, CAD and dyslipidemia.  According to the patient, he was treated for pneumonia earlier in the year but never really returned to his baseline.  Patient reports shortness of breath, dyspnea on exertion with minimal exertion.  Patient has bilateral lower leg edema, but this may be chronic, considering pulmonary hypertension.  Patient only reports 1 pillow orthopnea.  According to the patient, he smoked for 60 years before quitting.  Fever and coughing documented on H&P.  Currently, patient is afebrile.  Cough is said to be productive of whitish phlegm.  Patient is currently on Rocephin and azithromycin.  Patient is also on treatment for COPD exacerbation.  Will add IV diuretics.  Further management will depend on hospital course.  08/01/17: Patient seen and examined at his bedside.  He states he feels better today however he does not believe he is back to his baseline.  Admits to dyspnea on exertion.  Denies chest pain or palpitations.  Assessment/Plan: Principal Problem:   CAP (community acquired pneumonia) Active Problems:   Coronary artery disease   Hypertension   COPD (chronic obstructive pulmonary disease) (HCC)   Hypercholesterolemia   CKD (chronic kidney disease), stage III (HCC)   Diabetes mellitus type 2, uncontrolled (HCC)   Left ventricular diastolic dysfunction   Pulmonary HTN (HCC)  CAP, POA Improving Afebrile with no leukocytosis Continue IV azithromycin and IV ceftriaxone Continue duo nebs 3 times daily Continue IV Solu-Medrol twice daily Repeat CBC in the morning  Anemia of chronic disease Possibly secondary to  advanced CKD Hemoglobin stable at 10 No overt sign of bleeding Repeat CBC in the morning  Hyperkalemia Possibly secondary to advanced CKD Potassium 5.0 Give Kayexalate Repeat BMP in the morning  CKD 3 Appears to be at baseline 1.4 Avoid nephrotoxic agents/hypotension Repeat BMP in the morning    Code Status: Partial  Family Communication: None at bedside  Disposition Plan: will stay another midnight to continue IV antibiotics and IV steroids due to persistent wheezes.   Consultants:  None  Procedures:  None  Antimicrobials:  IV ceftriaxone and IV Rocephin  DVT prophylaxis: Subcu Lovenox   Objective: Vitals:   07/31/17 2055 07/31/17 2127 08/01/17 0534 08/01/17 0748  BP:  (!) 136/58 (!) 154/59   Pulse: 69 73 73   Resp: 18 20 20    Temp:  97.7 F (36.5 C) 97.7 F (36.5 C)   TempSrc:  Oral    SpO2: 97% 96% 95% 92%  Weight:   81.7 kg (180 lb 1.9 oz)   Height:        Intake/Output Summary (Last 24 hours) at 08/01/2017 1026 Last data filed at 08/01/2017 1478 Gross per 24 hour  Intake 1391.67 ml  Output 650 ml  Net 741.67 ml   Filed Weights   07/31/17 0500 08/01/17 0534  Weight: 81.7 kg (180 lb 1.9 oz) 81.7 kg (180 lb 1.9 oz)    Exam:   General: 84 year old Caucasian male well-developed well-nourished in no acute distress.  Alert and oriented x3.  Cardiovascular: Regular rate and rhythm with no rubs or gallops.  No thyromegaly or JVD appreciated.  Respiratory: Diffuse wheezes and mild crackles at bases.  Good inspiratory effort.  Abdomen: Soft nontender nondistended with normal  bowel sounds x4  Musculoskeletal: Moves all 4 extremities.  Trace edema in lower extremities.  Skin: No ulcerative lesions or rashes.  Psychiatry: Mood is appropriate for condition and setting.   Data Reviewed: CBC: Recent Labs  Lab 07/30/17 1138 07/31/17 0502  WBC 10.7* 8.2  HGB 11.8* 10.7*  HCT 37.0* 34.0*  MCV 97.9 97.7  PLT 243 220   Basic Metabolic  Panel: Recent Labs  Lab 07/30/17 1138 07/30/17 1619 07/31/17 0502 08/01/17 0320  NA 135  --  134* 136  K 5.2*  --  5.2* 5.0  CL 102  --  102 102  CO2 23  --  24 23  GLUCOSE 250*  --  331* 312*  BUN 26*  --  35* 40*  CREATININE 1.49*  --  1.52* 1.45*  CALCIUM 9.1  --  8.8* 9.2  MG  --  2.2  --   --   PHOS  --   --   --  3.0   GFR: Estimated Creatinine Clearance: 36.7 mL/min (A) (by C-G formula based on SCr of 1.45 mg/dL (H)). Liver Function Tests: Recent Labs  Lab 08/01/17 0320  ALBUMIN 2.6*   No results for input(s): LIPASE, AMYLASE in the last 168 hours. No results for input(s): AMMONIA in the last 168 hours. Coagulation Profile: No results for input(s): INR, PROTIME in the last 168 hours. Cardiac Enzymes: No results for input(s): CKTOTAL, CKMB, CKMBINDEX, TROPONINI in the last 168 hours. BNP (last 3 results) No results for input(s): PROBNP in the last 8760 hours. HbA1C: Recent Labs    07/30/17 1659  HGBA1C 7.7*   CBG: Recent Labs  Lab 07/31/17 0818 07/31/17 1152 07/31/17 1635 07/31/17 2138 08/01/17 0739  GLUCAP 326* 300* 277* 332* 249*   Lipid Profile: No results for input(s): CHOL, HDL, LDLCALC, TRIG, CHOLHDL, LDLDIRECT in the last 72 hours. Thyroid Function Tests: No results for input(s): TSH, T4TOTAL, FREET4, T3FREE, THYROIDAB in the last 72 hours. Anemia Panel: No results for input(s): VITAMINB12, FOLATE, FERRITIN, TIBC, IRON, RETICCTPCT in the last 72 hours. Urine analysis:    Component Value Date/Time   COLORURINE YELLOW 10/14/2016 2003   APPEARANCEUR Clear 11/20/2016 1409   LABSPEC 1.007 10/14/2016 2003   PHURINE 5.0 10/14/2016 2003   GLUCOSEU Negative 11/20/2016 1409   HGBUR MODERATE (A) 10/14/2016 2003   BILIRUBINUR Negative 11/20/2016 1409   KETONESUR NEGATIVE 10/14/2016 2003   PROTEINUR Negative 11/20/2016 1409   PROTEINUR NEGATIVE 10/14/2016 2003   NITRITE Negative 11/20/2016 1409   NITRITE NEGATIVE 10/14/2016 2003   LEUKOCYTESUR  Negative 11/20/2016 1409   Sepsis Labs: @LABRCNTIP (procalcitonin:4,lacticidven:4)  ) Recent Results (from the past 240 hour(s))  Blood culture (routine x 2)     Status: None (Preliminary result)   Collection Time: 07/30/17  3:56 PM  Result Value Ref Range Status   Specimen Description BLOOD RIGHT ANTECUBITAL  Final   Special Requests   Final    BOTTLES DRAWN AEROBIC AND ANAEROBIC Blood Culture adequate volume   Culture  Setup Time   Final    GRAM POSITIVE COCCI IN BOTH AEROBIC AND ANAEROBIC BOTTLES CRITICAL RESULT CALLED TO, READ BACK BY AND VERIFIED WITH: Patrick North PharmD 14:50 07/31/17 ([email or web address]) Performed at [facility], [address], [place], [place] [address]    Culture [facility] POSITIVE COCCI  Final   Report Status PENDING  Incomplete  Blood Culture ID Panel (Reflexed)     Status: Abnormal   Collection Time: 07/30/17  3:56 PM  Result  Value Ref Range Status   Enterococcus species NOT DETECTED NOT DETECTED Final   Listeria monocytogenes NOT DETECTED NOT DETECTED Final   Staphylococcus species DETECTED (A) NOT DETECTED Final    Comment: Methicillin (oxacillin) susceptible coagulase negative staphylococcus. Possible blood culture contaminant (unless isolated from more than one blood culture draw or clinical case suggests pathogenicity). No antibiotic treatment is indicated for blood  culture contaminants. CRITICAL RESULT CALLED TO, READ BACK BY AND VERIFIED WITH: Patrick North PharmD 14:50 07/31/17 ([email or web address])    Staphylococcus aureus NOT DETECTED NOT DETECTED Final   Methicillin resistance NOT DETECTED NOT DETECTED Final   Streptococcus species NOT DETECTED NOT DETECTED Final   Streptococcus agalactiae NOT DETECTED NOT DETECTED Final   Streptococcus pneumoniae NOT DETECTED NOT DETECTED Final   Streptococcus pyogenes NOT DETECTED NOT DETECTED Final   Acinetobacter baumannii NOT DETECTED NOT DETECTED Final   Enterobacteriaceae species NOT DETECTED NOT DETECTED Final    Enterobacter cloacae complex NOT DETECTED NOT DETECTED Final   Escherichia coli NOT DETECTED NOT DETECTED Final   Klebsiella oxytoca NOT DETECTED NOT DETECTED Final   Klebsiella pneumoniae NOT DETECTED NOT DETECTED Final   Proteus species NOT DETECTED NOT DETECTED Final   Serratia marcescens NOT DETECTED NOT DETECTED Final   Haemophilus influenzae NOT DETECTED NOT DETECTED Final   Neisseria meningitidis NOT DETECTED NOT DETECTED Final   Pseudomonas aeruginosa NOT DETECTED NOT DETECTED Final   Candida albicans NOT DETECTED NOT DETECTED Final   Candida glabrata NOT DETECTED NOT DETECTED Final   Candida krusei NOT DETECTED NOT DETECTED Final   Candida parapsilosis NOT DETECTED NOT DETECTED Final   Candida tropicalis NOT DETECTED NOT DETECTED Final    Comment: Performed at [facility], [address] [place], [place] [address]  Blood culture (routine x 2)     Status: None (Preliminary result)   Collection Time: 07/30/17  4:01 PM  Result Value Ref Range Status   Specimen Description BLOOD LEFT ANTECUBITAL  Final   Special Requests   Final    BOTTLES DRAWN AEROBIC AND ANAEROBIC Blood Culture adequate volume   Culture   Final    NO GROWTH < 24 HOURS Performed at [facility], [address], [place], [place] [address]    Report Status PENDING  Incomplete      Studies: Ct Chest Wo Contrast  Result Date: 07/31/2017 CLINICAL DATA:  Follow-up pneumonia. EXAM: CT CHEST WITHOUT CONTRAST TECHNIQUE: Multidetector CT imaging of the chest was performed following the standard protocol without IV contrast. COMPARISON:  July 30, 2017 chest x-ray and CT scan December 10, 2015 FINDINGS: Cardiovascular: Coronary artery calcifications are identified. The heart is unchanged. Atherosclerotic changes in the nonaneurysmal aorta. The central pulmonary arteries are normal in caliber. Mediastinum/Nodes: Tiny right pleural effusion. The esophagus is normal. No adenopathy. The thyroid is unremarkable.  Lungs/Pleura: Central airways are normal. No pneumothorax. Scattered ground-glass, peripheral focal, and nodular opacities in the bilateral upper lobes in the right lower lobe. A thin-walled bulla posteriorly in the right lung base is stable. Upper Abdomen: Known hemorrhagic cyst off the posterior right kidney. No other changes in the upper abdomen. Musculoskeletal: No chest wall mass or suspicious bone lesions identified. IMPRESSION: 1. Patchy ground-glass, nodular, and peripheral opacities in the upper lobes and right lower lobe are consistent with an infectious or inflammatory process. Recommend follow-up to complete resolution. 2. Coronary artery calcifications. Atherosclerotic changes in the nonaneurysmal aorta. 3. Tiny right pleural effusion. Aortic Atherosclerosis (ICD10-I70.0). Electronically Signed  By: Gerome Samavid  Williams III M.D   On: 07/31/2017 17:24    Scheduled Meds: . atorvastatin  40 mg Oral Daily  . doxazosin  4 mg Oral QHS  . enoxaparin (LOVENOX) injection  40 mg Subcutaneous Q24H  . furosemide  60 mg Intravenous Q12H  . insulin aspart  0-15 Units Subcutaneous TID WC  . insulin aspart  0-5 Units Subcutaneous QHS  . ipratropium-albuterol  3 mL Nebulization TID  . methylPREDNISolone (SOLU-MEDROL) injection  60 mg Intravenous Q12H  . metoprolol  100 mg Oral Daily  . multivitamin with minerals  1 tablet Oral Daily  . polyvinyl alcohol  1 drop Both Eyes QID  . sodium chloride flush  3 mL Intravenous Q12H    Continuous Infusions: . azithromycin Stopped (07/31/17 2000)  . cefTRIAXone (ROCEPHIN)  IV Stopped (07/31/17 1730)     LOS: 2 days     Darlin Droparole N Marayah Higdon, MD [facility] Pager [phone] If 7PM-7AM, please contact night-coverage [email or web address] Password [email or web address] 08/01/2017, 10:26 AM

## 2017-08-02 ENCOUNTER — Ambulatory Visit: Payer: Medicare PPO | Admitting: Family Medicine

## 2017-08-02 DIAGNOSIS — J441 Chronic obstructive pulmonary disease with (acute) exacerbation: Secondary | ICD-10-CM

## 2017-08-02 DIAGNOSIS — N183 Chronic kidney disease, stage 3 (moderate): Secondary | ICD-10-CM

## 2017-08-02 DIAGNOSIS — J181 Lobar pneumonia, unspecified organism: Principal | ICD-10-CM

## 2017-08-02 DIAGNOSIS — I5033 Acute on chronic diastolic (congestive) heart failure: Secondary | ICD-10-CM

## 2017-08-02 LAB — BASIC METABOLIC PANEL
ANION GAP: 13 (ref 5–15)
BUN: 46 mg/dL — ABNORMAL HIGH (ref 6–20)
CALCIUM: 9.1 mg/dL (ref 8.9–10.3)
CHLORIDE: 100 mmol/L — AB (ref 101–111)
CO2: 23 mmol/L (ref 22–32)
Creatinine, Ser: 1.55 mg/dL — ABNORMAL HIGH (ref 0.61–1.24)
GFR calc non Af Amer: 39 mL/min — ABNORMAL LOW (ref >60)
GFR, EST AFRICAN AMERICAN: 45 mL/min — AB (ref >60)
Glucose, Bld: 317 mg/dL — ABNORMAL HIGH (ref 65–99)
Potassium: 3.9 mmol/L (ref 3.5–5.1)
Sodium: 136 mmol/L (ref 135–145)

## 2017-08-02 LAB — CBC
HEMATOCRIT: 35.6 % — AB (ref 39.0–52.0)
HEMOGLOBIN: 11.6 g/dL — AB (ref 13.0–17.0)
MCH: 31 pg (ref 26.0–34.0)
MCHC: 32.6 g/dL (ref 30.0–36.0)
MCV: 95.2 fL (ref 78.0–100.0)
Platelets: 272 10*3/uL (ref 150–400)
RBC: 3.74 MIL/uL — ABNORMAL LOW (ref 4.22–5.81)
RDW: 12.5 % (ref 11.5–15.5)
WBC: 11.4 10*3/uL — AB (ref 4.0–10.5)

## 2017-08-02 LAB — GLUCOSE, CAPILLARY
Glucose-Capillary: 294 mg/dL — ABNORMAL HIGH (ref 65–99)
Glucose-Capillary: 373 mg/dL — ABNORMAL HIGH (ref 65–99)

## 2017-08-02 LAB — CULTURE, BLOOD (ROUTINE X 2): SPECIAL REQUESTS: ADEQUATE

## 2017-08-02 MED ORDER — IPRATROPIUM-ALBUTEROL 0.5-2.5 (3) MG/3ML IN SOLN: 3.0000 mL | Freq: Two times a day (BID) | RESPIRATORY_TRACT | Status: DC

## 2017-08-02 MED ORDER — PREDNISONE 10 MG PO TABS: ORAL_TABLET | ORAL | 0 refills | Status: DC

## 2017-08-02 MED ORDER — AZITHROMYCIN 500 MG PO TABS: 500.0000 mg | ORAL_TABLET | Freq: Every day | ORAL | 0 refills | Status: DC

## 2017-08-02 MED ORDER — CEFUROXIME AXETIL 500 MG PO TABS: 500.0000 mg | ORAL_TABLET | Freq: Two times a day (BID) | ORAL | 0 refills | Status: DC

## 2017-08-02 NOTE — Discharge Summary (Signed)
Discharge Summary  Timothy Cordova ZOX:[id number] DOB: [birth year]  PCP: Hannah Beat, MD  Admit date: 07/30/2017 Discharge date: 08/02/2017  Time spent: 25 minutes  Recommendations for Outpatient Follow-up:  1. New medication: Prednisone taper 2. New medication: Ceftin 500 p.o. twice daily x4 days 3. New medication: Zithromax 500 p.o. daily x4 days 4. Patient will go home with home health PT and RN 5. Follow-up with PCP in the next 2 weeks.  Consider discontinuation of metformin given interaction with CHF and risk for lactic acidosis  Discharge Diagnoses:  Active Hospital Problems   Diagnosis Date Noted  . CAP (community acquired pneumonia) 07/30/2017  . Coronary artery disease 07/30/2017  . Hypertension 07/30/2017  . COPD (chronic obstructive pulmonary disease) (HCC) 07/30/2017  . Hypercholesterolemia 07/30/2017  . CKD (chronic kidney disease), stage III (HCC) 07/30/2017  . Diabetes mellitus type 2, uncontrolled (HCC) 07/30/2017  . Left ventricular diastolic dysfunction 07/30/2017  . Pulmonary HTN (HCC) 07/30/2017    Resolved Hospital Problems  No resolved problems to display.    Discharge Condition: Improved, being discharged home  Diet recommendation: Heart healthy  Vitals:   08/02/17 0821 08/02/17 1316  BP:  (!) 133/43  Pulse:  70  Resp:  18  Temp:  97.8 F (36.6 C)  SpO2: 94% 95%    History of present illness:  84 year old male with past medical history of COPD, chronic diastolic dysfunction, pulmonary hypertension, CAD and stage III chronic kidney disease admitted on 4/22 for worsening shortness of breath and found to have COPD exacerbation, pneumonia and CHF exacerbation.  Patient started on IV steroids, nebulizers, antibiotics and Lasix.  Hospital Course:  Principal Problem:   CAP (community acquired pneumonia) with COPD exacerbation: Responded well to treatment.  By 4/24, back to baseline.  Ambulating on room air.  Will need for more days of p.o.  antibiotics to complete a 7-day course.  Prednisone taper. Active Problems:   Coronary artery disease: Stable during his hospitalization.   Hypertension: Stable.  Continue home medications upon discharge.  Blood pressure improved once diuresed.   COPD (chronic obstructive pulmonary disease) (HCC)   Hypercholesterolemia   CKD (chronic kidney disease), stage III (HCC): Remained at baseline, creatinine around 1.4.  GFR of 39 on day of discharge   Diabetes mellitus type 2, uncontrolled (HCC) with chronic kidney disease without long-term use of insulin: Patient had elevated CBGs during hospitalization, would likely benefit from being on insulin.  Is also on metformin and glipizide.  Would recommend to his PCP about discontinuing his metformin with concerns of CHF and lactic acidosis. Acute on chronic diastolic heart failure: Did well with Lasix, felt to be down to dry weight.   Pulmonary HTN (HCC) Anemia: Chronic disease secondary to kidney disease.  Hemoglobin remained stable during his hospitalization. Hyperkalemia: Secondary to advanced CKD.  Potassium 5.0.   Procedures:  None  Consultations:  None  Discharge Exam: BP (!) 133/43 (BP Location: Left Arm)   Pulse 70   Temp 97.8 F (36.6 C)   Resp 18   Ht 5\' 7"  (1.702 m)   Wt 81.7 kg (180 lb 1.9 oz)   SpO2 95%   BMI 28.21 kg/m   General: Alert and oriented x3, no acute distress Cardiovascular: Regular rate and rhythm, S1-S2 Respiratory: Decreased breath sounds throughout, only minimal end expiratory wheeze  Discharge Instructions You were cared for by a hospitalist during your hospital stay. If you have any questions about your discharge medications or the care you received while you  were in the hospital after you are discharged, you can call the unit and asked to speak with the hospitalist on call if the hospitalist that took care of you is not available. Once you are discharged, your primary care physician will handle any further  medical issues. Please note that NO REFILLS for any discharge medications will be authorized once you are discharged, as it is imperative that you return to your primary care physician (or establish a relationship with a primary care physician if you do not have one) for your aftercare needs so that they can reassess your need for medications and monitor your lab values.   Allergies as of 08/02/2017      Reactions   Metformin And Related Nausea And Vomiting, Rash   Tramadol Hcl Other (See Comments)   Tremor vs seizure activity   Codeine Nausea And Vomiting   Gabapentin Other (See Comments)   "Messed up my nervous system"   Morphine Sulfate Nausea And Vomiting   Other Nausea And Vomiting   "Narcotics, in general"   Ticlopidine Hcl Other (See Comments)   Reaction unknown by patient or family   Warfarin Sodium Nausea And Vomiting, Other (See Comments)   "Severe fatigue," also   Ace Inhibitors Rash, Cough   Atorvastatin Rash, Other (See Comments)   Makes the patient "weak"   Tramadol Rash      Medication List    TAKE these medications   albuterol 108 (90 Base) MCG/ACT inhaler Commonly known as:  PROVENTIL HFA;VENTOLIN HFA Inhale 2 puffs into the lungs every 6 (six) hours as needed for wheezing or shortness of breath.   aspirin EC 81 MG tablet Take 81 mg by mouth daily.   atorvastatin 40 MG tablet Commonly known as:  LIPITOR Take 40 mg by mouth at bedtime.   azithromycin 500 MG tablet Commonly known as:  ZITHROMAX Take 1 tablet (500 mg total) by mouth daily.   Carboxymethylcellulose Sod PF 0.25 % Soln Place 2 drops into both eyes 2 (two) times daily.   cefUROXime 500 MG tablet Commonly known as:  CEFTIN Take 1 tablet (500 mg total) by mouth 2 (two) times daily with a meal.   diphenhydramine-acetaminophen 25-500 MG Tabs tablet Commonly known as:  TYLENOL PM Take 1 tablet by mouth at bedtime as needed (for sleep).   doxazosin 8 MG tablet Commonly known as:  CARDURA Take  4 mg by mouth at bedtime.   furosemide 20 MG tablet Commonly known as:  LASIX Take 20 mg by mouth daily.   glipiZIDE 5 MG tablet Commonly known as:  GLUCOTROL Take 1 tablet (5 mg total) by mouth 2 (two) times daily before a meal.   metoprolol 200 MG 24 hr tablet Commonly known as:  TOPROL-XL Take 100 mg by mouth daily.   ONE TOUCH ULTRA TEST test strip Generic drug:  glucose blood USE TO CHECK BLOOD SUGAR 2 TIMES DAILY AS DIRECTED   ONETOUCH DELICA LANCETS 33G Misc   predniSONE 10 MG tablet Commonly known as:  DELTASONE Take 50 mg po daily x 2 days, then decrease by 10 every 2 days until finished What changed:    additional instructions  Another medication with the same name was removed. Continue taking this medication, and follow the directions you see here.   PRESERVISION AREDS 2 Caps Take 1 capsule by mouth daily with breakfast. What changed:  Another medication with the same name was removed. Continue taking this medication, and follow the directions you see here.  STIOLTO RESPIMAT 2.5-2.5 MCG/ACT Aers Generic drug:  Tiotropium Bromide-Olodaterol Inhale 2 puffs into the lungs daily.      Allergies  Allergen Reactions  . Metformin And Related Nausea And Vomiting and Rash  . Tramadol Hcl Other (See Comments)    Tremor vs seizure activity  . Codeine Nausea And Vomiting  . Gabapentin Other (See Comments)    "Messed up my nervous system"  . Morphine Sulfate Nausea And Vomiting  . Other Nausea And Vomiting    "Narcotics, in general"  . Ticlopidine Hcl Other (See Comments)    Reaction unknown by patient or family  . Warfarin Sodium Nausea And Vomiting and Other (See Comments)    "Severe fatigue," also  . Ace Inhibitors Rash and Cough  . Atorvastatin Rash and Other (See Comments)    Makes the patient "weak"  . Tramadol Rash      The results of significant diagnostics from this hospitalization (including imaging, microbiology, ancillary and laboratory) are  listed below for reference.    Significant Diagnostic Studies: Dg Chest 2 View  Result Date: 07/30/2017 CLINICAL DATA:  Chest pain for 3 days, shortness of breath EXAM: CHEST - 2 VIEW COMPARISON:  12/20/2016 FINDINGS: There is airspace disease in the right upper lobe peripherally concerning for pneumonia. There is no pleural effusion or pneumothorax. The heart and mediastinal contours are unremarkable. The osseous structures are unremarkable. IMPRESSION: Right upper lobe pneumonia. Followup PA and lateral chest X-ray is recommended in 3-4 weeks following trial of antibiotic therapy to ensure resolution and exclude underlying malignancy. Electronically Signed   By: Elige Ko   On: 07/30/2017 12:37   Ct Chest Wo Contrast  Result Date: 07/31/2017 CLINICAL DATA:  Follow-up pneumonia. EXAM: CT CHEST WITHOUT CONTRAST TECHNIQUE: Multidetector CT imaging of the chest was performed following the standard protocol without IV contrast. COMPARISON:  July 30, 2017 chest x-ray and CT scan December 10, 2015 FINDINGS: Cardiovascular: Coronary artery calcifications are identified. The heart is unchanged. Atherosclerotic changes in the nonaneurysmal aorta. The central pulmonary arteries are normal in caliber. Mediastinum/Nodes: Tiny right pleural effusion. The esophagus is normal. No adenopathy. The thyroid is unremarkable. Lungs/Pleura: Central airways are normal. No pneumothorax. Scattered ground-glass, peripheral focal, and nodular opacities in the bilateral upper lobes in the right lower lobe. A thin-walled bulla posteriorly in the right lung base is stable. Upper Abdomen: Known hemorrhagic cyst off the posterior right kidney. No other changes in the upper abdomen. Musculoskeletal: No chest wall mass or suspicious bone lesions identified. IMPRESSION: 1. Patchy ground-glass, nodular, and peripheral opacities in the upper lobes and right lower lobe are consistent with an infectious or inflammatory process. Recommend  follow-up to complete resolution. 2. Coronary artery calcifications. Atherosclerotic changes in the nonaneurysmal aorta. 3. Tiny right pleural effusion. Aortic Atherosclerosis (ICD10-I70.0). Electronically Signed   By: Gerome Sam III M.D   On: 07/31/2017 17:24    Microbiology: Recent Results (from the past 240 hour(s))  Blood culture (routine x 2)     Status: Abnormal (Preliminary result)   Collection Time: 07/30/17  3:56 PM  Result Value Ref Range Status   Specimen Description BLOOD RIGHT ANTECUBITAL  Final   Special Requests   Final    BOTTLES DRAWN AEROBIC AND ANAEROBIC Blood Culture adequate volume   Culture  Setup Time   Final    GRAM POSITIVE COCCI IN BOTH AEROBIC AND ANAEROBIC BOTTLES CRITICAL RESULT CALLED TO, READ BACK BY AND VERIFIED WITH: Patrick North PharmD 14:50 07/31/17 ([email or web address])  Culture (A)  Final    STAPHYLOCOCCUS SPECIES (COAGULASE NEGATIVE) THE SIGNIFICANCE OF ISOLATING THIS ORGANISM FROM A SINGLE SET OF BLOOD CULTURES WHEN MULTIPLE SETS ARE DRAWN IS UNCERTAIN. PLEASE NOTIFY THE MICROBIOLOGY DEPARTMENT WITHIN ONE WEEK IF SPECIATION AND SENSITIVITIES ARE REQUIRED. Performed at [facility], [address] [place], [place] [address]    Report Status PENDING  Incomplete  Blood Culture ID Panel (Reflexed)     Status: Abnormal   Collection Time: 07/30/17  3:56 PM  Result Value Ref Range Status   Enterococcus species NOT DETECTED NOT DETECTED Final   Listeria monocytogenes NOT DETECTED NOT DETECTED Final   Staphylococcus species DETECTED (A) NOT DETECTED Final    Comment: Methicillin (oxacillin) susceptible coagulase negative staphylococcus. Possible blood culture contaminant (unless isolated from more than one blood culture draw or clinical case suggests pathogenicity). No antibiotic treatment is indicated for blood  culture contaminants. CRITICAL RESULT CALLED TO, READ BACK BY AND VERIFIED WITH: Patrick NorthN. Batchelder PharmD 14:50 07/31/17 ([email or web address])     Staphylococcus aureus NOT DETECTED NOT DETECTED Final   Methicillin resistance NOT DETECTED NOT DETECTED Final   Streptococcus species NOT DETECTED NOT DETECTED Final   Streptococcus agalactiae NOT DETECTED NOT DETECTED Final   Streptococcus pneumoniae NOT DETECTED NOT DETECTED Final   Streptococcus pyogenes NOT DETECTED NOT DETECTED Final   Acinetobacter baumannii NOT DETECTED NOT DETECTED Final   Enterobacteriaceae species NOT DETECTED NOT DETECTED Final   Enterobacter cloacae complex NOT DETECTED NOT DETECTED Final   Escherichia coli NOT DETECTED NOT DETECTED Final   Klebsiella oxytoca NOT DETECTED NOT DETECTED Final   Klebsiella pneumoniae NOT DETECTED NOT DETECTED Final   Proteus species NOT DETECTED NOT DETECTED Final   Serratia marcescens NOT DETECTED NOT DETECTED Final   Haemophilus influenzae NOT DETECTED NOT DETECTED Final   Neisseria meningitidis NOT DETECTED NOT DETECTED Final   Pseudomonas aeruginosa NOT DETECTED NOT DETECTED Final   Candida albicans NOT DETECTED NOT DETECTED Final   Candida glabrata NOT DETECTED NOT DETECTED Final   Candida krusei NOT DETECTED NOT DETECTED Final   Candida parapsilosis NOT DETECTED NOT DETECTED Final   Candida tropicalis NOT DETECTED NOT DETECTED Final    Comment: Performed at [facility], [address] [place], [place] [address]  Blood culture (routine x 2)     Status: None (Preliminary result)   Collection Time: 07/30/17  4:01 PM  Result Value Ref Range Status   Specimen Description BLOOD LEFT ANTECUBITAL  Final   Special Requests   Final    BOTTLES DRAWN AEROBIC AND ANAEROBIC Blood Culture adequate volume   Culture   Final    NO GROWTH 2 DAYS Performed at [facility], [address] [place], [place] [address]    Report Status PENDING  Incomplete     Labs: Basic Metabolic Panel: Recent Labs  Lab 07/30/17 1138 07/30/17 1619 07/31/17 0502 08/01/17 0320 08/02/17 0416  NA 135  --  134* 136 136  K 5.2*  --   5.2* 5.0 3.9  CL 102  --  102 102 100*  CO2 23  --  24 23 23   GLUCOSE 250*  --  331* 312* 317*  BUN 26*  --  35* 40* 46*  CREATININE 1.49*  --  1.52* 1.45* 1.55*  CALCIUM 9.1  --  8.8* 9.2 9.1  MG  --  2.2  --   --   --   PHOS  --   --   --  3.0  --    Liver Function Tests: Recent Labs  Lab 08/01/17 0320  ALBUMIN 2.6*   No results for input(s): LIPASE, AMYLASE in the last 168 hours. No results for input(s): AMMONIA in the last 168 hours. CBC: Recent Labs  Lab 07/30/17 1138 07/31/17 0502 08/02/17 0416  WBC 10.7* 8.2 11.4*  HGB 11.8* 10.7* 11.6*  HCT 37.0* 34.0* 35.6*  MCV 97.9 97.7 95.2  PLT 243 220 272   Cardiac Enzymes: No results for input(s): CKTOTAL, CKMB, CKMBINDEX, TROPONINI in the last 168 hours. BNP: BNP (last 3 results) Recent Labs    08/29/16 1745 10/14/16 1510 07/30/17 1644  BNP 299.5* 360.9* 127.4*    ProBNP (last 3 results) No results for input(s): PROBNP in the last 8760 hours.  CBG: Recent Labs  Lab 08/01/17 1156 08/01/17 1632 08/01/17 2154 08/02/17 0801 08/02/17 1226  GLUCAP 313* 252* 334* 294* 373*       Signed:  Hollice Espy, MD [facility] 08/02/2017, 1:19 PM

## 2017-08-02 NOTE — Progress Notes (Signed)
Discharge instructions (including medications) discussed with and copy provided to patient/caregiver 

## 2017-08-02 NOTE — Evaluation (Signed)
Physical Therapy Evaluation Patient Details Name: Timothy Cordova MRN: [id number] DOB: September 13, 1928 Today's Date: 08/02/2017   History of Present Illness  Pt is an 84 y/o male admitted secondary to increased SOB. CT of chest revealed opacities in upper lobes and R lower lobes.   Clinical Impression  Pt admitted secondary to problem above with deficits below. Pt with increased SOB during gait requiring standing rest. Pt's oxygen sats decreased to 85% on RA, however, with seated rest and pursed lip breathing, returned to 91% on RA.  Required min guard for steadying throughout. Educated about using rollator at home to increase safety with mobility. Will continue to follow acutely to maximize functional mobility independence and safety.     Follow Up Recommendations Home health PT;Supervision for mobility/OOB    Equipment Recommendations  None recommended by PT    Recommendations for Other Services       Precautions / Restrictions Precautions Precautions: Fall;Other (comment) Precaution Comments: Watch O2 Restrictions Weight Bearing Restrictions: No      Mobility  Bed Mobility               General bed mobility comments: Standing in room upon entry.   Transfers Overall transfer level: Needs assistance Equipment used: None Transfers: Sit to/from Stand Sit to Stand: Supervision         General transfer comment: Supervision for safety.   Ambulation/Gait Ambulation/Gait assistance: Min guard Ambulation Distance (Feet): 125 Feet Assistive device: None Gait Pattern/deviations: Step-through pattern;Decreased stride length Gait velocity: Decreased Gait velocity interpretation: <1.31 ft/sec, indicative of household ambulator General Gait Details: Slow, guarded gait. Min guard for steadying assist. Required standing rest secondary to increased SOB. Oxygen sats decreased to 85% on RA, however, with seated rest, increased to 91% on RA. Educated to use rollator at home to  increase stability and for energy conservation.   Stairs            Wheelchair Mobility    Modified Rankin (Stroke Patients Only)       Balance Overall balance assessment: Needs assistance Sitting-balance support: No upper extremity supported;Feet supported Sitting balance-Leahy Scale: Good     Standing balance support: No upper extremity supported;During functional activity Standing balance-Leahy Scale: Fair                               Pertinent Vitals/Pain Pain Assessment: No/denies pain    Home Living Family/patient expects to be discharged to:: Private residence Living Arrangements: Spouse/significant other Available Help at Discharge: Family;Available 24 hours/day Type of Home: House Home Access: Ramped entrance     Home Layout: Two level;Able to live on main level with bedroom/bathroom Home Equipment: Dan HumphreysWalker - 2 wheels;Walker - 4 wheels;Cane - single point;Bedside commode;Shower seat;Grab bars - toilet;Grab bars - tub/shower      Prior Function Level of Independence: Independent               Hand Dominance        Extremity/Trunk Assessment   Upper Extremity Assessment Upper Extremity Assessment: Overall WFL for tasks assessed    Lower Extremity Assessment Lower Extremity Assessment: Generalized weakness    Cervical / Trunk Assessment Cervical / Trunk Assessment: Normal  Communication   Communication: No difficulties  Cognition Arousal/Alertness: Awake/alert Behavior During Therapy: WFL for tasks assessed/performed Overall Cognitive Status: Within Functional Limits for tasks assessed  General Comments General comments (skin integrity, edema, etc.): Pt's wife present in room. Educated about energy conservation techniques at home.     Exercises     Assessment/Plan    PT Assessment Patient needs continued PT services  PT Problem List Cardiopulmonary status  limiting activity;Decreased strength;Decreased balance;Decreased mobility;Decreased knowledge of use of DME;Decreased knowledge of precautions       PT Treatment Interventions DME instruction;Gait training;Functional mobility training;Therapeutic activities;Therapeutic exercise;Balance training;Neuromuscular re-education;Patient/family education    PT Goals (Current goals can be found in the Care Plan section)  Acute Rehab PT Goals Patient Stated Goal: to go home  PT Goal Formulation: With patient Time For Goal Achievement: 08/16/17 Potential to Achieve Goals: Good    Frequency Min 3X/week   Barriers to discharge        Co-evaluation               AM-PAC PT "6 Clicks" Daily Activity  Outcome Measure Difficulty turning over in bed (including adjusting bedclothes, sheets and blankets)?: A Little Difficulty moving from lying on back to sitting on the side of the bed? : A Little Difficulty sitting down on and standing up from a chair with arms (e.g., wheelchair, bedside commode, etc,.)?: A Little Help needed moving to and from a bed to chair (including a wheelchair)?: A Little Help needed walking in hospital room?: A Little Help needed climbing 3-5 steps with a railing? : A Lot 6 Click Score: 17    End of Session Equipment Utilized During Treatment: Gait belt Activity Tolerance: Patient tolerated treatment well Patient left: in bed;with call bell/phone within reach;with family/visitor present Nurse Communication: Mobility status PT Visit Diagnosis: Unsteadiness on feet (R26.81);Other abnormalities of gait and mobility (R26.89)    Time: 5784-6962 PT Time Calculation (min) (ACUTE ONLY): 16 min   Charges:   PT Evaluation $PT Eval Low Complexity: 1 Low     PT G Codes:        Gladys Damme, PT, DPT  Acute Rehabilitation Services  Pager: [phone]   Lehman Prom 08/02/2017, 3:08 PM

## 2017-08-02 NOTE — Progress Notes (Signed)
Inpatient Diabetes Program Recommendations  AACE/ADA: New Consensus Statement on Inpatient Glycemic Control (2015)  Target Ranges:  Prepandial:   less than 140 mg/dL      Peak postprandial:   less than 180 mg/dL (1-2 hours)      Critically ill patients:  140 - 180 mg/dL   Lab Results  Component Value Date   GLUCAP 294 (H) 08/02/2017   HGBA1C 7.7 (H) 07/30/2017    Review of Glycemic Control Results for Timothy Cordova, Timothy Cordova (MRN [id number]) as of 08/02/2017 10:33  Ref. Range 08/01/2017 11:56 08/01/2017 16:32 08/01/2017 21:54 08/02/2017 08:01  Glucose-Capillary Latest Ref Range: 65 - 99 mg/dL 147313 (H) 829252 (H) 562334 (H) 294 (H)   Diabetes history:Type 2 DM Outpatient Diabetes medications:Glipizide 5 mg BID Current orders for Inpatient glycemic control:Novolog 0-15 units TID, Novolog 0-5 units QHS, Solumedrol 60 mg Q12H  Inpatient Diabetes Program Recommendations:  In the setting of steroids,may want to consider Lantus 12 units QHS. If post prandials continue to exceed 180 mg/DL, consider Novolog 3 units TID (assuming patient is eating >50%). Expect insulin needs to decrease once steroids are tapered. Text page sent.  Thanks, Lujean RaveLauren Ande Therrell, MSN, RNC-OB Diabetes Coordinator [phone] (8a-5p)

## 2017-08-03 ENCOUNTER — Other Ambulatory Visit: Payer: Self-pay

## 2017-08-04 LAB — CULTURE, BLOOD (ROUTINE X 2)
Culture: NO GROWTH
SPECIAL REQUESTS: ADEQUATE

## 2017-08-06 ENCOUNTER — Telehealth: Payer: Self-pay

## 2017-08-06 ENCOUNTER — Other Ambulatory Visit: Payer: Self-pay

## 2017-08-06 NOTE — Telephone Encounter (Signed)
Transition Care Management Follow-up Telephone Call   Date discharged? 08/02/17   How have you been since you were released from the hospital? "Doing Better" " I feel a lot better!   Do you understand why you were in the hospital? Yes   Do you understand the discharge instructions? Yes   Where were you discharged to? Home    Items Reviewed:  Medications reviewed: Yes  Allergies reviewed: Yes  Dietary changes reviewed: no changes, appetite is good.  Referrals reviewed: No specialty referrals, just PCP.     Functional Questionnaire:   Activities of Daily Living (ADLs):   He states they are independent in the following: grooming, dressing, feeding, ambulation, bathing States they require assistance with the following:  Does not require any assistance.   Any transportation issues/concerns?: no   Any patient concerns? No   Confirmed importance and date/time of follow-up visits scheduled: Yes  Provider Appointment booked with Dr. Patsy Lager 08/08/17 at 1020.   Confirmed with patient if condition begins to worsen call PCP or go to the ER.  Patient was given the office number and encouraged to call back with question or concerns.  : Yes.

## 2017-08-06 NOTE — Patient Outreach (Addendum)
[facility]) Care Management  08/06/2017  TESLA BOCHICCHIO 06-18-1928 [id number]   Telephone Screen  Referral Date: 08/03/17 Referral Source: [facility] Liaison Referral Reason: " COPD mgmt" Insurance: [facility]    Outreach attempt # 1 to patient. Spoke with patient. He voices he is doing well. Patient recently discharged from hospital on 08/03/17 for CAP. PCP office does TOC call. He states that he has gotten all his meds picked up and has no issues or concerns regarding meds. He is able to drive himself to appts. He haas not yet made PCP follow up appt as he was just discharged Friday evening. He already plans to contact MD office today to make appt. RN CM explained [facility] services and having a nurse follow up with him to help manage his conditions. He is familiar with speaking with [facility] hospital liaison in the hospital. He voices he thought it would be a one time call follow up and not continual ongoing calls Patient stets that he is "doing okay and does not need all the calls." He states that he is able to manage his issues. He voices no care coordination issues or concerns. He was appreciative of call but does not want to continue to get calls.      Plan: RN CM will close case at this time.    Antionette Fairy, RN,BSN,CCM [facility] Care Management Telephonic Care Management Coordinator Direct Phone: [phone] Toll Free: [phone] Fax: [phone]

## 2017-08-06 NOTE — Patient Outreach (Signed)
[facility]) Care Management  08/06/2017  Winifred Balogh Benegas Apr 06, 1929 [id number]   Transition of care  Referral date: 08/06/17 Referral source: referral from Charlesetta Shanks, hospital liaison.  Patient discharged from [facility] on 08/02/17 Insurance: [facility]  Telephone call to patient regarding transition of care referral. Patient states he is eating his supper and request call back on another day.   PLAN: RNCM will attempt 2nd telephone call to patient within 4 business days.  RNCM will send patient outreach letter to attempt contact.   George Ina RN,BSN,CCM [facility] Telephonic  [phone] '

## 2017-08-08 ENCOUNTER — Encounter: Payer: Self-pay | Admitting: Family Medicine

## 2017-08-08 ENCOUNTER — Ambulatory Visit: Payer: Self-pay

## 2017-08-08 ENCOUNTER — Ambulatory Visit: Payer: Medicare PPO | Admitting: Family Medicine

## 2017-08-08 ENCOUNTER — Other Ambulatory Visit: Payer: Self-pay

## 2017-08-08 VITALS — BP 124/62 | HR 54 | Temp 97.5°F | Ht 66.0 in | Wt 184.0 lb

## 2017-08-08 DIAGNOSIS — I5032 Chronic diastolic (congestive) heart failure: Secondary | ICD-10-CM

## 2017-08-08 DIAGNOSIS — E1121 Type 2 diabetes mellitus with diabetic nephropathy: Secondary | ICD-10-CM | POA: Diagnosis not present

## 2017-08-08 DIAGNOSIS — J432 Centrilobular emphysema: Secondary | ICD-10-CM

## 2017-08-08 DIAGNOSIS — J189 Pneumonia, unspecified organism: Secondary | ICD-10-CM

## 2017-08-08 DIAGNOSIS — I10 Essential (primary) hypertension: Secondary | ICD-10-CM

## 2017-08-08 MED ORDER — GLIPIZIDE 5 MG PO TABS: 5.0000 mg | ORAL_TABLET | Freq: Two times a day (BID) | ORAL | 3 refills | Status: DC

## 2017-08-08 NOTE — Progress Notes (Signed)
Dr. Karleen Hampshire T. Vickii Volland, MD, CAQ Sports Medicine Primary Care and Sports Medicine [address] [place], [address] Phone: [phone] Fax: [phone]  08/08/2017  Patient: Timothy Cordova, MRN: [id number], DOB: 11-27-1928, 84 y.o.  Primary Physician:  Hannah Beat, MD   Chief Complaint  Patient presents with  . Hospitalization Follow-up   Subjective:   Timothy Cordova is a 84 y.o. very pleasant male patient who presents with the following:  Hospital follow-up: TCM  Admit date: 07/30/2017 Discharge date: 08/02/2017  Patient was admitted with community-acquired pneumonia as well as COPD.  He had some fairly significant shortness of breath and was given high-dose IV steroids as well as prednisone throughout his hospital stay.  Does have well-controlled diabetes at home with an A1c in the 7 range long-term.  He has done well with oral medications in the past.  At the time of this exam he is breathing reasonably comfortably, and he is taking both his oral antibiotics as well as his steroids.  Patient has diastolic heart failure, grade 1, and his most recent ejection fraction was 60 to 65%.  F/u 1 month chest x-ray  Past Medical History, Surgical History, Social History, Family History, Problem List, Medications, and Allergies have been reviewed and updated if relevant.  Patient Active Problem List   Diagnosis Date Noted  . CKD (chronic kidney disease) stage 3, GFR 30-59 ml/min (HCC) 08/18/2010    Priority: High  . DIASTOLIC HEART FAILURE, CHRONIC 08/18/2009    Priority: High  . CAD, AUTOLOGOUS BYPASS GRAFT 06/07/2008    Priority: High  . DM (diabetes mellitus), type 2 with renal complications (HCC) 09/04/2007    Priority: High  . Centrilobular emphysema (HCC) 08/08/2005    Priority: High  . CAP (community acquired pneumonia) 07/30/2017  . Coronary artery disease 07/30/2017  . Hypertension 07/30/2017  . COPD (chronic obstructive pulmonary disease) (HCC)  07/30/2017  . Hypercholesterolemia 07/30/2017  . CKD (chronic kidney disease), stage III (HCC) 07/30/2017  . Diabetes mellitus type 2, uncontrolled (HCC) 07/30/2017  . Left ventricular diastolic dysfunction 07/30/2017  . Pulmonary HTN (HCC) 07/30/2017  . Solitary pulmonary nodule 12/10/2015  . Anemia 11/26/2014  . Aortic calcification, 04/20/2005 CT Chest 04/03/2013  . Sleep apnea   . Tobacco abuse (in remission)   . Chronic low back pain 04/18/2011  . Renal lesion 03/27/2011  . OSTEOARTHRITIS 12/06/2009  . Mixed hyperlipidemia 06/11/2008  . ALLERGIC RHINITIS 01/30/2007  . HEARING LOSS 07/06/2006  . Essential hypertension 07/06/2006  . BENIGN PROSTATIC HYPERTROPHY 07/06/2006    Past Medical History:  Diagnosis Date  . Allergic rhinitis   . Benign prostatic hypertrophy   . Chronic diastolic heart failure (HCC)    8/08 ECHO with EF 55%  . CKD (chronic kidney disease)   . COPD (chronic obstructive pulmonary disease) (HCC)    moderate. followed by Dr.Byrum  . Coronary artery disease    s/p CABG 2006. LHC (1/10): SVG-OM and PLOM, SVG-D, and LIMA-LAD were all patent  . Dementia    (mild)--06/2006  . Diabetes mellitus   . Edema    localized. suspect diastolic CHF + venous insufficiency  . Hearing loss    left >>right  . History of tobacco abuse   . Hypertension   . Mixed hyperlipidemia   . Osteoarthritis   . Pneumonia 07/31/2017  . Sleep apnea    ?    Past Surgical History:  Procedure Laterality Date  . ANGIOPLASTY  [id number]  . CARDIAC CATHETERIZATION  90s   [place] x1stent  . CARDIAC CATHETERIZATION     [facility];x2 stents  . CARDIAC CATHETERIZATION  04/29/2013  . CORONARY ARTERY BYPASS GRAFT  02/2005  . ROTATOR CUFF REPAIR  2005   left, [place]    Social History   Socioeconomic History  . Marital status: Married    Spouse name: Not on file  . Number of children: Not on file  . Years of education: Not on file  . Highest education level: Not on file    Occupational History  . Occupation: Research officer, trade union: RETIRED    Comment: RETIRED  Social Needs  . Financial resource strain: Not on file  . Food insecurity:    Worry: Not on file    Inability: Not on file  . Transportation needs:    Medical: Not on file    Non-medical: Not on file  Tobacco Use  . Smoking status: Former Smoker    Packs/day: 3.00    Years: 60.00    Pack years: 180.00    Types: Cigarettes    Last attempt to quit: 07/12/2005    Years since quitting: 12.0  . Smokeless tobacco: Never Used  Substance and Sexual Activity  . Alcohol use: No    Alcohol/week: 0.0 oz  . Drug use: No  . Sexual activity: Not on file  Lifestyle  . Physical activity:    Days per week: Not on file    Minutes per session: Not on file  . Stress: Not on file  Relationships  . Social connections:    Talks on phone: Not on file    Gets together: Not on file    Attends religious service: Not on file    Active member of club or organization: Not on file    Attends meetings of clubs or organizations: Not on file    Relationship status: Not on file  . Intimate partner violence:    Fear of current or ex partner: Not on file    Emotionally abused: Not on file    Physically abused: Not on file    Forced sexual activity: Not on file  Other Topics Concern  . Not on file  Social History Narrative  . Not on file    Family History  Problem Relation Age of Onset  . Heart attack Mother   . Lung cancer Sister   . Prostate cancer Neg Hx   . Kidney cancer Neg Hx     Allergies  Allergen Reactions  . Metformin And Related Nausea And Vomiting and Rash  . Tramadol Hcl Other (See Comments)    Tremor vs seizure activity  . Codeine Nausea And Vomiting  . Gabapentin Other (See Comments)    "Messed up my nervous system"  . Morphine Sulfate Nausea And Vomiting  . Other Nausea And Vomiting    "Narcotics, in general"  . Ticlopidine Hcl Other (See Comments)    Reaction unknown by  patient or family  . Warfarin Sodium Nausea And Vomiting and Other (See Comments)    "Severe fatigue," also  . Ace Inhibitors Rash and Cough  . Atorvastatin Rash and Other (See Comments)    Makes the patient "weak"  . Tramadol Rash    Medication list reviewed and updated in full in [place] [facility].   GEN: No acute illnesses, no fevers, chills. GI: No n/v/d, eating normally Pulm: mild SOB Interactive and getting along well at home.  Otherwise, ROS is as per the HPI.  Objective:   BP 124/62   Pulse (!) 54   Temp (!) 97.5 F (36.4 C) (Oral)   Ht  (1.676 m)   Wt 184 lb (83.5 kg)   SpO2 99%   BMI 29.70 kg/m   GEN: WDWN, NAD, Non-toxic, A & O x 3 HEENT: Atraumatic, Normocephalic. Neck supple. No masses, No LAD. Ears and Nose: No external deformity. CV: RRR, No M/G/R. No JVD. No thrill. No extra heart sounds. PULM: scattered rhonchi. No retractions. No resp. distress. No accessory muscle use. EXTR: No c/c/e NEURO Normal gait.  PSYCH: Normally interactive. Conversant. Not depressed or anxious appearing.  Calm demeanor.   Laboratory and Imaging Data:  Assessment and Plan:   Community acquired pneumonia, unspecified laterality - Plan: DG Chest 2 View  Type 2 diabetes mellitus with diabetic nephropathy, without long-term current use of insulin (HCC)  Centrilobular emphysema (HCC)  DIASTOLIC HEART FAILURE, CHRONIC  Essential hypertension   Timothy Cordova is feeling a lot better.  His breathing is a lot better, and he is moving air, and he is tolerating his antibiotics and his steroids are okay.  I am going to repeat his chest x-ray in 1 month's time to ensure clearing of his pneumonia and rule out possible malignancy, but his CT of his chest was reassuring.  Blood pressures have been stable.  I would anticipate that his blood sugars were elevated secondary to high-dose steroids in the hospital.  His blood sugars have been pretty well controlled over a long time.  With oral  medication.  I discussed this with him.  He does have normal systolic function, but grade 1 diastolic dysfunction.  I think that the potential risk of putting a relatively frail 84 year old on insulin outweighs the potential benefits.  As long as his renal function is stable, I would continue him on his current diabetic regimen.  Follow-up: in June for AMW  Meds ordered this encounter  Medications  . glipiZIDE (GLUCOTROL) 5 MG tablet    Sig: Take 1 tablet (5 mg total) by mouth 2 (two) times daily before a meal.    Dispense:  180 tablet    Refill:  3   Medications Discontinued During This Encounter  Medication Reason  . glipiZIDE (GLUCOTROL) 5 MG tablet Reorder   Orders Placed This Encounter  Procedures  . DG Chest 2 View    Signed,  Karleen Hampshire T. Roshard Rezabek, MD   Allergies as of 08/08/2017      Reactions   Metformin And Related Nausea And Vomiting, Rash   Tramadol Hcl Other (See Comments)   Tremor vs seizure activity   Codeine Nausea And Vomiting   Gabapentin Other (See Comments)   "Messed up my nervous system"   Morphine Sulfate Nausea And Vomiting   Other Nausea And Vomiting   "Narcotics, in general"   Ticlopidine Hcl Other (See Comments)   Reaction unknown by patient or family   Warfarin Sodium Nausea And Vomiting, Other (See Comments)   "Severe fatigue," also   Ace Inhibitors Rash, Cough   Atorvastatin Rash, Other (See Comments)   Makes the patient "weak"   Tramadol Rash      Medication List        Accurate as of 08/08/17 11:18 AM. Always use your most recent med list.          albuterol 108 (90 Base) MCG/ACT inhaler Commonly known as:  PROVENTIL HFA;VENTOLIN HFA Inhale 2 puffs into the lungs every 6 (six) hours as needed for wheezing  or shortness of breath.   aspirin EC 81 MG tablet Take 81 mg by mouth daily.   atorvastatin 40 MG tablet Commonly known as:  LIPITOR Take 40 mg by mouth at bedtime.   azithromycin 500 MG tablet Commonly known as:   ZITHROMAX Take 1 tablet (500 mg total) by mouth daily.   Carboxymethylcellulose Sod PF 0.25 % Soln Place 2 drops into both eyes 2 (two) times daily.   cefUROXime 500 MG tablet Commonly known as:  CEFTIN Take 1 tablet (500 mg total) by mouth 2 (two) times daily with a meal.   diphenhydramine-acetaminophen 25-500 MG Tabs tablet Commonly known as:  TYLENOL PM Take 1 tablet by mouth at bedtime as needed (for sleep).   doxazosin 8 MG tablet Commonly known as:  CARDURA Take 4 mg by mouth at bedtime.   furosemide 20 MG tablet Commonly known as:  LASIX Take 20 mg by mouth daily.   glipiZIDE 5 MG tablet Commonly known as:  GLUCOTROL Take 1 tablet (5 mg total) by mouth 2 (two) times daily before a meal.   metoprolol 200 MG 24 hr tablet Commonly known as:  TOPROL-XL Take 100 mg by mouth daily.   ONE TOUCH ULTRA TEST test strip Generic drug:  glucose blood USE TO CHECK BLOOD SUGAR 2 TIMES DAILY AS DIRECTED   ONETOUCH DELICA LANCETS 33G Misc   predniSONE 10 MG tablet Commonly known as:  DELTASONE Take 50 mg po daily x 2 days, then decrease by 10 every 2 days until finished   PRESERVISION AREDS 2 Caps Take 1 capsule by mouth daily with breakfast.   STIOLTO RESPIMAT 2.5-2.5 MCG/ACT Aers Generic drug:  Tiotropium Bromide-Olodaterol Inhale 2 puffs into the lungs daily.

## 2017-08-10 ENCOUNTER — Other Ambulatory Visit: Payer: Self-pay

## 2017-08-10 NOTE — Patient Outreach (Signed)
[facility]) Care Management  08/10/2017  WEST BOOMERSHINE 03-03-1929 [id number]   Per note patient was contact by Antionette Fairy on 08/06/17 regarding follow up post hospital discharge.    PLAN: No additional follow up needed by this RNCm.   George Ina RN,BSN,CCM [facility] Telephonic  [phone]

## 2017-09-10 ENCOUNTER — Ambulatory Visit (INDEPENDENT_AMBULATORY_CARE_PROVIDER_SITE_OTHER): Payer: Medicare PPO

## 2017-09-10 VITALS — BP 120/70 | HR 102 | Temp 97.8°F | Ht 66.5 in | Wt 180.8 lb

## 2017-09-10 DIAGNOSIS — I10 Essential (primary) hypertension: Secondary | ICD-10-CM | POA: Diagnosis not present

## 2017-09-10 DIAGNOSIS — Z0000 Encounter for general adult medical examination without abnormal findings: Secondary | ICD-10-CM | POA: Diagnosis not present

## 2017-09-10 DIAGNOSIS — E782 Mixed hyperlipidemia: Secondary | ICD-10-CM

## 2017-09-10 LAB — CBC WITH DIFFERENTIAL/PLATELET
BASOS PCT: 0.6 % (ref 0.0–3.0)
Basophils Absolute: 0.1 10*3/uL (ref 0.0–0.1)
EOS ABS: 0.1 10*3/uL (ref 0.0–0.7)
Eosinophils Relative: 1.5 % (ref 0.0–5.0)
HCT: 40.1 % (ref 39.0–52.0)
Hemoglobin: 13.3 g/dL (ref 13.0–17.0)
LYMPHS ABS: 1.6 10*3/uL (ref 0.7–4.0)
Lymphocytes Relative: 17.5 % (ref 12.0–46.0)
MCHC: 33.2 g/dL (ref 30.0–36.0)
MCV: 96.1 fl (ref 78.0–100.0)
MONO ABS: 1 10*3/uL (ref 0.1–1.0)
Monocytes Relative: 10.4 % (ref 3.0–12.0)
NEUTROS PCT: 70 % (ref 43.0–77.0)
Neutro Abs: 6.4 10*3/uL (ref 1.4–7.7)
Platelets: 208 10*3/uL (ref 150.0–400.0)
RBC: 4.17 Mil/uL — ABNORMAL LOW (ref 4.22–5.81)
RDW: 14.5 % (ref 11.5–15.5)
WBC: 9.1 10*3/uL (ref 4.0–10.5)

## 2017-09-10 LAB — HEPATIC FUNCTION PANEL
ALT: 12 U/L (ref 0–53)
AST: 13 U/L (ref 0–37)
Albumin: 3.8 g/dL (ref 3.5–5.2)
Alkaline Phosphatase: 63 U/L (ref 39–117)
BILIRUBIN DIRECT: 0.1 mg/dL (ref 0.0–0.3)
BILIRUBIN TOTAL: 0.3 mg/dL (ref 0.2–1.2)
Total Protein: 6.4 g/dL (ref 6.0–8.3)

## 2017-09-10 LAB — BASIC METABOLIC PANEL
BUN: 31 mg/dL — AB (ref 6–23)
CHLORIDE: 103 meq/L (ref 96–112)
CO2: 30 meq/L (ref 19–32)
Calcium: 9.5 mg/dL (ref 8.4–10.5)
Creatinine, Ser: 1.61 mg/dL — ABNORMAL HIGH (ref 0.40–1.50)
GFR: 43.33 mL/min — ABNORMAL LOW (ref >60.00)
Glucose, Bld: 129 mg/dL — ABNORMAL HIGH (ref 70–99)
POTASSIUM: 5.4 meq/L — AB (ref 3.5–5.1)
SODIUM: 139 meq/L (ref 135–145)

## 2017-09-10 LAB — TSH: TSH: 1.84 u[IU]/mL (ref 0.35–4.50)

## 2017-09-10 LAB — LIPID PANEL
Cholesterol: 133 mg/dL (ref 0–200)
HDL: 30.6 mg/dL — AB (ref >39.00)
LDL CALC: 68 mg/dL (ref 0–99)
NONHDL: 102.72
Total CHOL/HDL Ratio: 4
Triglycerides: 172 mg/dL — ABNORMAL HIGH (ref 0.0–149.0)
VLDL: 34.4 mg/dL (ref 0.0–40.0)

## 2017-09-10 NOTE — Progress Notes (Signed)
PCP notes:   Health maintenance:  Foot exam - PCP please address at next appt  Abnormal screenings:   Hearing - failed  Hearing Screening   125Hz  250Hz  500Hz  1000Hz  2000Hz  3000Hz  4000Hz  6000Hz  8000Hz   Right ear:   40 40 40  0    Left ear:   40 0 0  0     Mini-Cog score: 18/20 MMSE - Mini Mental State Exam 09/10/2017  Orientation to time 5  Orientation to Place 5  Registration 3  Attention/ Calculation 0  Recall 1  Recall-comments unable to recall 2 of 3 words  Language- name 2 objects 0  Language- repeat 1  Language- follow 3 step command 3  Language- read & follow direction 0  Write a sentence 0  Copy design 0  Total score 18    Patient concerns:   None  Nurse concerns:  None  Next PCP appt:   09/17/17 @ 0900

## 2017-09-10 NOTE — Progress Notes (Signed)
Subjective:   Timothy Cordova is a 84 y.o. male who presents for Medicare Annual/Subsequent preventive examination.  Review of Systems:  N/A Cardiac Risk Factors include: advanced age (>40men, >45 women);diabetes mellitus;male gender     Objective:    Vitals: BP 120/70 (BP Location: Right Arm, Patient Position: Sitting, Cuff Size: Normal)   Pulse (!) 102   Temp 97.8 F (36.6 C) (Oral)   Ht 5' 6.5" (1.689 m) Comment: no shoes  Wt 180 lb 12 oz (82 kg)   SpO2 93%   BMI 28.74 kg/m   Body mass index is 28.74 kg/m.  Advanced Directives 09/10/2017 07/31/2017 10/17/2016 10/14/2016 09/04/2016 08/29/2016 08/29/2016  Does Patient Have a Medical Advance Directive? Yes Yes Yes Yes No No No  Type of Estate agent of [place];Living will Living will Healthcare Power of [place];Living will Healthcare Power of [place];Living will - - -  Does patient want to make changes to medical advance directive? - No - Patient declined No - Patient declined - - - -  Copy of Healthcare Power of Attorney in Chart? No - copy requested - No - copy requested No - copy requested - - -  Would patient like information on creating a medical advance directive? - - - - - No - Patient declined -    Tobacco Social History   Tobacco Use  Smoking Status Former Smoker  . Packs/day: 3.00  . Years: 60.00  . Pack years: 180.00  . Types: Cigarettes  . Last attempt to quit: 07/12/2005  . Years since quitting: 12.1  Smokeless Tobacco Never Used     Counseling given: No   Clinical Intake:  Pre-visit preparation completed: Yes  Pain : No/denies pain Pain Score: 0-No pain     Nutritional Status: BMI 25 -29 Overweight Nutritional Risks: Unintentional weight loss(recent hospitalization) Diabetes: Yes CBG done?: No Did pt. bring in CBG monitor from home?: No  How often do you need to have someone help you when you read instructions, pamphlets, or other written materials from your doctor or  pharmacy?: 1 - Never What is the last grade level you completed in school?: 11th grade - highest grade in school   Interpreter Needed?: No  Comments: pt lives with spouse Information entered by :: LPinson, LPN  Past Medical History:  Diagnosis Date  . Allergic rhinitis   . Benign prostatic hypertrophy   . Chronic diastolic heart failure (HCC)    8/08 ECHO with EF 55%  . CKD (chronic kidney disease)   . COPD (chronic obstructive pulmonary disease) (HCC)    moderate. followed by Dr.Byrum  . Coronary artery disease    s/p CABG 2006. LHC (1/10): SVG-OM and PLOM, SVG-D, and LIMA-LAD were all patent  . Dementia    (mild)--06/2006  . Diabetes mellitus   . Edema    localized. suspect diastolic CHF + venous insufficiency  . Hearing loss    left >>right  . History of tobacco abuse   . Hypertension   . Mixed hyperlipidemia   . Osteoarthritis   . Pneumonia 07/31/2017  . Sleep apnea    ?   Past Surgical History:  Procedure Laterality Date  . ANGIOPLASTY  [id number]  . CARDIAC CATHETERIZATION  90s   [place], [place] x1stent  . CARDIAC CATHETERIZATION     [facility];x2 stents  . CARDIAC CATHETERIZATION  04/29/2013  . CORONARY ARTERY BYPASS GRAFT  02/2005  . ROTATOR CUFF REPAIR  2005   left, [place]   Family History  Problem Relation Age of Onset  . Heart attack Mother   . Lung cancer Sister   . Prostate cancer Neg Hx   . Kidney cancer Neg Hx    Social History   Socioeconomic History  . Marital status: Married    Spouse name: Not on file  . Number of children: Not on file  . Years of education: Not on file  . Highest education level: Not on file  Occupational History  . 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 Financial resource strain: Not on file  . Food insecurity:    Worry: Not on file    Inability: Not on file  . Transportation needs:    Medical: Not on file    Non-medical: Not on file  Tobacco Use  . Smoking status:  Former Smoker    Packs/day: 3.00    Years: 60.00    Pack years: 180.00    Types: Cigarettes    Last attempt to quit: 07/12/2005    Years since quitting: 12.1  . Smokeless tobacco: Never Used  Substance and Sexual Activity  . Alcohol use: No    Alcohol/week: 0.0 oz  . Drug use: No  . Sexual activity: Not on file  Lifestyle  . Physical activity:    Days per week: Not on file    Minutes per session: Not on file  . Stress: Not on file  Relationships  . Social connections:    Talks on phone: Not on file    Gets together: Not on file    Attends religious service: Not on file    Active member of club or organization: Not on file    Attends meetings of clubs or organizations: Not on file    Relationship status: Not on file  Other Topics Concern  . Not on file  Social History Narrative  . Not on file    Outpatient Encounter Medications as of 09/10/2017  Medication Sig  . albuterol (PROVENTIL HFA;VENTOLIN HFA) 108 (90 Base) MCG/ACT inhaler Inhale 2 puffs into the lungs every 6 (six) hours as needed for wheezing or shortness of breath.  [name]. aspirin EC 81 MG tablet Take 81 mg by mouth daily.  [name]. atorvastatin (LIPITOR) 40 MG tablet Take 40 mg by mouth at bedtime.   . Carboxymethylcellulose Sod PF 0.25 % SOLN Place 2 drops into both eyes 2 (two) times daily.   . diphenhydramine-acetaminophen (TYLENOL PM) 25-500 MG TABS tablet Take 1 tablet by mouth at bedtime as needed (for sleep).   [name]. doxazosin (CARDURA) 8 MG tablet Take 4 mg by mouth at bedtime.   . furosemide (LASIX) 20 MG tablet Take 20 mg by mouth daily.  [name]. glipiZIDE (GLUCOTROL) 5 MG tablet Take 1 tablet (5 mg total) by mouth 2 (two) times daily before a meal.  . metoprolol (TOPROL-XL) 200 MG 24 hr tablet Take 100 mg by mouth daily.  . Multiple Vitamins-Minerals (PRESERVISION AREDS 2) CAPS Take 1 capsule by mouth daily with breakfast.   . ONE TOUCH ULTRA TEST test strip USE TO CHECK BLOOD SUGAR 2 TIMES DAILY AS DIRECTED  . ONETOUCH  DELICA LANCETS 33G MISC   . Tiotropium Bromide-Olodaterol (STIOLTO RESPIMAT) 2.5-2.5 MCG/ACT AERS Inhale 2 puffs into the lungs daily.   . [DISCONTINUED] azithromycin (ZITHROMAX) 500 MG tablet Take 1 tablet (500 mg total) by mouth daily.  . [DISCONTINUED] cefUROXime (CEFTIN) 500 MG tablet Take 1 tablet (500 mg total) by  mouth 2 (two) times daily with a meal.  . [DISCONTINUED] predniSONE (DELTASONE) 10 MG tablet Take 50 mg po daily x 2 days, then decrease by 10 every 2 days until finished   No facility-administered encounter medications on file as of 09/10/2017.     Activities of Daily Living In your present state of health, do you have any difficulty performing the following activities: 09/10/2017 07/31/2017  Hearing? Malvin Johns  Vision? N N  Difficulty concentrating or making decisions? N N  Walking or climbing stairs? Y Y  Comment SOB; O2 sat falls into 80s -  Dressing or bathing? N N  Doing errands, shopping? N Y  Quarry manager and eating ? N -  Using the Toilet? N -  In the past six months, have you accidently leaked urine? N -  Do you have problems with loss of bowel control? N -  Managing your Medications? N -  Managing your Finances? N -  Housekeeping or managing your Housekeeping? N -  Some recent data might be hidden    Patient Care Team: Hannah Beat, MD as PCP - General Mertie Moores, MD as Referring Physician (Pulmonary Disease) Antonieta Iba, MD as Consulting Physician (Cardiology)   Assessment:   This is a routine wellness examination for [facility].   Hearing Screening   125Hz  250Hz  500Hz  1000Hz  2000Hz  3000Hz  4000Hz  6000Hz  8000Hz   Right ear:   40 40 40  0    Left ear:   40 0 0  0    Vision Screening Comments: Vision exam in May 2019 @ [facility]    Exercise Activities and Dietary recommendations Current Exercise Habits: The patient does not participate in regular exercise at present, Exercise limited by: respiratory conditions(s)  Goals    . Patient Stated      Starting 09/10/2017, I will continue to take medications as prescribed and to continue following a no added salt diet.        Fall Risk Fall Risk  09/10/2017 07/03/2016  Falls in the past year? No Yes  Number falls in past yr: - 2 or more  Injury with Fall? - No   Depression Screen PHQ 2/9 Scores 09/10/2017 07/03/2016  PHQ - 2 Score 0 0  PHQ- 9 Score 0 -    Cognitive Function MMSE - Mini Mental State Exam 09/10/2017  Orientation to time 5  Orientation to Place 5  Registration 3  Attention/ Calculation 0  Recall 1  Recall-comments unable to recall 2 of 3 words  Language- name 2 objects 0  Language- repeat 1  Language- follow 3 step command 3  Language- read & follow direction 0  Write a sentence 0  Copy design 0  Total score 18       PLEASE NOTE: A Mini-Cog screen was completed. Maximum score is 20. A value of 0 denotes this part of Folstein MMSE was not completed or the patient failed this part of the Mini-Cog screening.   Mini-Cog Screening Orientation to Time - Max 5 pts Orientation to Place - Max 5 pts Registration - Max 3 pts Recall - Max 3 pts Language Repeat - Max 1 pts Language Follow 3 Step Command - Max 3 pts   Immunization History  Administered Date(s) Administered  . Influenza Split 03/21/2011, 01/09/2012  . Influenza Whole 01/30/2005  . Influenza, High Dose Seasonal PF 01/03/2016  . Pneumococcal Conjugate-13 08/19/2014  . Pneumococcal Polysaccharide-23 04/11/2003  . Td 04/11/2003, 04/26/2015  . Zoster 12/17/2007, 12/18/2007  Screening Tests Health Maintenance  Topic Date Due  . FOOT EXAM  10/07/2017 (Originally 07/03/2017)  . INFLUENZA VACCINE  11/08/2017  . HEMOGLOBIN A1C  01/29/2018  . OPHTHALMOLOGY EXAM  09/06/2018  . TETANUS/TDAP  04/25/2025  . PNA vac Low Risk Adult  Completed     Plan:     I have personally reviewed, addressed, and noted the following in the patient's chart:  A. Medical and social history B. Use of alcohol, tobacco or  illicit drugs  C. Current medications and supplements D. Functional ability and status E.  Nutritional status F.  Physical activity G. Advance directives H. List of other physicians I.  Hospitalizations, surgeries, and ER visits in previous 12 months J.  Vitals K. Screenings to include hearing, vision, cognitive, depression L. Referrals and appointments - none  In addition, I have reviewed and discussed with patient certain preventive protocols, quality metrics, and best practice recommendations. A written personalized care plan for preventive services as well as general preventive health recommendations were provided to patient.  See attached scanned questionnaire for additional information.   Signed,   Randa Evens, MHA, BS, LPN Health Coach

## 2017-09-10 NOTE — Patient Instructions (Signed)
[name] , Thank you for taking time to come for your Medicare Wellness Visit. I appreciate your ongoing commitment to your health goals. Please review the following plan we discussed and let me know if I can assist you in the future.   These are the goals we discussed: Goals    . Patient Stated     Starting 09/10/2017, I will continue to take medications as prescribed and to continue following a no added salt diet.        This is a list of the screening recommended for you and due dates:  Health Maintenance  Topic Date Due  . Complete foot exam   10/07/2017*  . Flu Shot  11/08/2017  . Hemoglobin A1C  01/29/2018  . Eye exam for diabetics  09/06/2018  . Tetanus Vaccine  04/25/2025  . Pneumonia vaccines  Completed  *Topic was postponed. The date shown is not the original due date.   Preventive Care for Adults  A healthy lifestyle and preventive care can promote health and wellness. Preventive health guidelines for adults include the following key practices.  . A routine yearly physical is a good way to check with your health care provider about your health and preventive screening. It is a chance to share any concerns and updates on your health and to receive a thorough exam.  . Visit your dentist for a routine exam and preventive care every 6 months. Brush your teeth twice a day and floss once a day. Good oral hygiene prevents tooth decay and gum disease.  . The frequency of eye exams is based on your age, health, family medical history, use  of contact lenses, and other factors. Follow your health care provider's recommendations for frequency of eye exams.  . Eat a healthy diet. Foods like vegetables, fruits, whole grains, low-fat dairy products, and lean protein foods contain the nutrients you need without too many calories. Decrease your intake of foods high in solid fats, added sugars, and salt. Eat the right amount of calories for you. Get information about a proper diet from  your health care provider, if necessary.  . Regular physical exercise is one of the most important things you can do for your health. Most adults should get at least 150 minutes of moderate-intensity exercise (any activity that increases your heart rate and causes you to sweat) each week. In addition, most adults need muscle-strengthening exercises on 2 or more days a week.  Silver Sneakers may be a benefit available to you. To determine eligibility, you may visit the website: www.silversneakers.com or contact program at 828-417-94571-6572530044 Mon-Fri between 8AM-8PM.   . Maintain a healthy weight. The body mass index (BMI) is a screening tool to identify possible weight problems. It provides an estimate of body fat based on height and weight. Your health care provider can find your BMI and can help you achieve or maintain a healthy weight.   For adults 20 years and older: ? A BMI below 18.5 is considered underweight. ? A BMI of 18.5 to 24.9 is normal. ? A BMI of 25 to 29.9 is considered overweight. ? A BMI of 30 and above is considered obese.   . Maintain normal blood lipids and cholesterol levels by exercising and minimizing your intake of saturated fat. Eat a balanced diet with plenty of fruit and vegetables. Blood tests for lipids and cholesterol should begin at age 82 and be repeated every 5 years. If your lipid or cholesterol levels are high, you are  over 65, or you are at high risk for heart disease, you may need your cholesterol levels checked more frequently. Ongoing high lipid and cholesterol levels should be treated with medicines if diet and exercise are not working.  . If you smoke, find out from your health care provider how to quit. If you do not use tobacco, please do not start.  . If you choose to drink alcohol, please do not consume more than 2 drinks per day. One drink is considered to be 12 ounces (355 mL) of beer, 5 ounces (148 mL) of wine, or 1.5 ounces (44 mL) of liquor.  . If you  are 37-46 years old, ask your health care provider if you should take aspirin to prevent strokes.  . Use sunscreen. Apply sunscreen liberally and repeatedly throughout the day. You should seek shade when your shadow is shorter than you. Protect yourself by wearing long sleeves, pants, a wide-brimmed hat, and sunglasses year round, whenever you are outdoors.  . Once a month, do a whole body skin exam, using a mirror to look at the skin on your back. Tell your health care provider of new moles, moles that have irregular borders, moles that are larger than a pencil eraser, or moles that have changed in shape or color.

## 2017-09-10 NOTE — Progress Notes (Signed)
I reviewed health advisor's note, was available for consultation, and agree with documentation and plan.   Signed,  Bonham Zingale T. Khrystal Jeanmarie, MD  

## 2017-09-16 NOTE — Progress Notes (Signed)
Dr. Frederico Hamman T. Fatin Bachicha, MD, [facility] [place], [address] Phone: [phone] Fax: [phone]  09/17/2017  Patient: Timothy Cordova, MRN: [id number], DOB: 06-Nov-1928, 84 y.o.  Primary Physician:  Owens Loffler, MD   Chief Complaint  Patient presents with  . Annual Exam    Part 2   Subjective:   [name] is a 84 y.o. pleasant patient who presents with the following:  Preventative Health Maintenance Visit:  Health Maintenance Summary Reviewed and updated, unless pt declines services.  Tobacco History Reviewed. Alcohol: No concerns, no excessive use Exercise Habits: Some activity, rec at least 30 mins 5 times a week STD concerns: no risk or activity to increase risk Drug Use: None Encouraged self-testicular check  [name] has multiple medical problems including COPD and emphysema.  Recently he was diagnosed with pneumonia in April 2019, but has recovered nicely from this. COPD overall doing better.  He also has coronary disease and is status post bypass grafting.  He also has chronic kidney disease stage III, but this is been stable for some time.  He also has type 2 diabetes, stable.  Hypertension and hyperlipidemia have been stable.  Health Maintenance  Topic Date Due  . FOOT EXAM  10/07/2017 (Originally 07/03/2017)  . INFLUENZA VACCINE  11/08/2017  . HEMOGLOBIN A1C  01/29/2018  . OPHTHALMOLOGY EXAM  09/06/2018  . TETANUS/TDAP  04/25/2025  . PNA vac Low Risk Adult  Completed   Immunization History  Administered Date(s) Administered  . Influenza Split 03/21/2011, 01/09/2012  . Influenza Whole 01/30/2005  . Influenza, High Dose Seasonal PF 01/03/2016  . Pneumococcal Conjugate-13 08/19/2014  . Pneumococcal Polysaccharide-23 04/11/2003  . Td 04/11/2003, 04/26/2015  . Zoster 12/17/2007, 12/18/2007   Patient Active Problem List   Diagnosis Date Noted  . CKD (chronic kidney disease) stage  3, GFR 30-59 ml/min (HCC) 08/18/2010    Priority: High  . DIASTOLIC HEART FAILURE, CHRONIC 08/18/2009    Priority: High  . CAD, AUTOLOGOUS BYPASS GRAFT 06/07/2008    Priority: High  . DM (diabetes mellitus), type 2 with renal complications ([place]) 35/57/3220    Priority: High  . Centrilobular emphysema ([place]) 08/08/2005    Priority: High  . COPD (chronic obstructive pulmonary disease) ([place]) 07/30/2017  . Pulmonary HTN ([place]) 07/30/2017  . Solitary pulmonary nodule 12/10/2015  . Anemia 11/26/2014  . Aortic calcification, 04/20/2005 CT Chest 04/03/2013  . Sleep apnea   . Tobacco abuse (in remission)   . Chronic low back pain 04/18/2011  . Renal lesion 03/27/2011  . OSTEOARTHRITIS 12/06/2009  . Mixed hyperlipidemia 06/11/2008  . ALLERGIC RHINITIS 01/30/2007  . HEARING LOSS 07/06/2006  . Essential hypertension 07/06/2006  . BENIGN PROSTATIC HYPERTROPHY 07/06/2006   Past Medical History:  Diagnosis Date  . Allergic rhinitis   . Benign prostatic hypertrophy   . Chronic diastolic heart failure ([place])    8/08 ECHO with EF 55%  . CKD (chronic kidney disease)   . COPD (chronic obstructive pulmonary disease) (HCC)    moderate. followed by Dr.Byrum  . Coronary artery disease    s/p CABG 2006. LHC (1/10): SVG-OM and PLOM, SVG-D, and LIMA-LAD were all patent  . Dementia    (mild)--06/2006  . Diabetes mellitus   . Edema    localized. suspect diastolic CHF + venous insufficiency  . Hearing loss    left >>right  . History of tobacco abuse   . Hypertension   . Mixed hyperlipidemia   .  Osteoarthritis   . Pneumonia 07/31/2017  . Sleep apnea    ?   Past Surgical History:  Procedure Laterality Date  . ANGIOPLASTY  [id number]  . CARDIAC CATHETERIZATION  90s   [place], [place] x1stent  . CARDIAC CATHETERIZATION     [facility];x2 stents  . CARDIAC CATHETERIZATION  04/29/2013  . CORONARY ARTERY BYPASS GRAFT  02/2005  . ROTATOR CUFF REPAIR  2005   left, [place]   Social History   Socioeconomic  History  . Marital status: Married    Spouse name: Not on file  . Number of children: Not on file  . Years of education: Not on file  . Highest education level: Not on file  Occupational History  . Occupation: Production designer, theatre/television/film: RETIRED    Comment: RETIRED  Social Needs  . Financial resource strain: Not on file  . Food insecurity:    Worry: Not on file    Inability: Not on file  . Transportation needs:    Medical: Not on file    Non-medical: Not on file  Tobacco Use  . Smoking status: Former Smoker    Packs/day: 3.00    Years: 60.00    Pack years: 180.00    Types: Cigarettes    Last attempt to quit: 07/12/2005    Years since quitting: 12.1  . Smokeless tobacco: Never Used  Substance and Sexual Activity  . Alcohol use: No    Alcohol/week: 0.0 oz  . Drug use: No  . Sexual activity: Not on file  Lifestyle  . Physical activity:    Days per week: Not on file    Minutes per session: Not on file  . Stress: Not on file  Relationships  . Social connections:    Talks on phone: Not on file    Gets together: Not on file    Attends religious service: Not on file    Active member of club or organization: Not on file    Attends meetings of clubs or organizations: Not on file    Relationship status: Not on file  . Intimate partner violence:    Fear of current or ex partner: Not on file    Emotionally abused: Not on file    Physically abused: Not on file    Forced sexual activity: Not on file  Other Topics Concern  . Not on file  Social History Narrative  . Not on file   Family History  Problem Relation Age of Onset  . Heart attack Mother   . Lung cancer Sister   . Prostate cancer Neg Hx   . Kidney cancer Neg Hx    Allergies  Allergen Reactions  . Metformin And Related Nausea And Vomiting and Rash  . Tramadol Hcl Other (See Comments)    Tremor vs seizure activity  . Codeine Nausea And Vomiting  . Gabapentin Other (See Comments)    "Messed up my nervous  system"  . Morphine Sulfate Nausea And Vomiting  . Other Nausea And Vomiting    "Narcotics, in general"  . Ticlopidine Hcl Other (See Comments)    Reaction unknown by patient or family  . Warfarin Sodium Nausea And Vomiting and Other (See Comments)    "Severe fatigue," also  . Ace Inhibitors Rash and Cough  . Tramadol Rash    Medication list has been reviewed and updated.   General: Denies fever, chills, sweats. No significant weight loss. Eyes: Denies blurring,significant itching ENT: Denies earache, sore throat, and hoarseness.  Cardiovascular: Denies chest pains, palpitations, dyspnea on exertion Respiratory: Denies cough, dyspnea at rest,wheeezing Breast: no concerns about lumps GI: Denies nausea, vomiting, diarrhea, constipation, change in bowel habits, abdominal pain, melena, hematochezia GU: Denies penile discharge, ED, urinary flow / outflow problems. No STD concerns. Musculoskeletal: Denies back pain, joint pain Derm: Denies rash, itching Neuro: Denies  paresthesias, frequent falls, frequent headaches Psych: Denies depression, anxiety Endocrine: Denies cold intolerance, heat intolerance, polydipsia Heme: Denies enlarged lymph nodes Allergy: No hayfever  Objective:   BP 110/62   Pulse 63   Temp 97.6 F (36.4 C) (Oral)   Ht '5\' 6"'$  (1.676 m)   Wt 181 lb 12 oz (82.4 kg)   SpO2 96%   BMI 29.34 kg/m  Ideal Body Weight: Weight in (lb) to have BMI = 25: 154.6  No exam data present  GEN: well developed, well nourished, no acute distress Eyes: conjunctiva and lids normal, PERRLA, EOMI ENT: TM clear, nares clear, oral exam WNL Neck: supple, no lymphadenopathy, no thyromegaly, no JVD Pulm: clear to auscultation and percussion, respiratory effort normal CV: regular rate and rhythm, S1-S2, no murmur, rub or gallop, no bruits, peripheral pulses normal and symmetric, no cyanosis, clubbing, edema or varicosities GI: soft, non-tender; no hepatosplenomegaly, masses; active  bowel sounds all quadrants GU: no hernia, testicular mass, penile discharge Lymph: no cervical, axillary or inguinal adenopathy MSK: gait normal, muscle tone and strength WNL, no joint swelling, effusions, discoloration, crepitus  SKIN: clear, good turgor, color WNL, no rashes, lesions, or ulcerations Neuro: normal mental status, normal strength, sensation, and motion Psych: alert; oriented to person, place and time, normally interactive and not anxious or depressed in appearance. All labs reviewed with patient.  Lipids:    Component Value Date/Time   CHOL 133 09/10/2017 0934   CHOL 155 04/27/2013 0601   TRIG 172.0 (H) 09/10/2017 0934   TRIG 120 04/27/2013 0601   HDL 30.60 (L) 09/10/2017 0934   HDL 69 (H) 04/27/2013 0601   VLDL 34.4 09/10/2017 0934   VLDL 24 04/27/2013 0601   CHOLHDL 4 09/10/2017 0934   CBC: CBC Latest Ref Rng & Units 09/10/2017 08/02/2017 07/31/2017  WBC 4.0 - 10.5 K/uL 9.1 11.4(H) 8.2  Hemoglobin 13.0 - 17.0 g/dL 13.3 11.6(L) 10.7(L)  Hematocrit 39.0 - 52.0 % 40.1 35.6(L) 34.0(L)  Platelets 150.0 - 400.0 K/uL 208.0 272 810    Basic Metabolic Panel:    Component Value Date/Time   NA 139 09/10/2017 0934   NA 133 (L) 05/01/2013 0457   K 5.4 (H) 09/10/2017 0934   K 4.7 05/01/2013 0457   CL 103 09/10/2017 0934   CL 101 05/01/2013 0457   CO2 30 09/10/2017 0934   CO2 29 05/01/2013 0457   BUN 31 (H) 09/10/2017 0934   BUN 41 (H) 05/01/2013 0457   CREATININE 1.61 (H) 09/10/2017 0934   CREATININE 1.57 (H) 05/01/2013 0457   GLUCOSE 129 (H) 09/10/2017 0934   GLUCOSE 274 (H) 05/01/2013 0457   CALCIUM 9.5 09/10/2017 0934   CALCIUM 8.9 05/01/2013 0457   Hepatic Function Latest Ref Rng & Units 09/10/2017 08/01/2017 10/16/2016  Total Protein 6.0 - 8.3 g/dL 6.4 - 6.4(L)  Albumin 3.5 - 5.2 g/dL 3.8 2.6(L) 3.1(L)  AST 0 - 37 U/L 13 - 19  ALT 0 - 53 U/L 12 - 16(L)  Alk Phosphatase 39 - 117 U/L 63 - 66  Total Bilirubin 0.2 - 1.2 mg/dL 0.3 - 0.6  Bilirubin, Direct 0.0 - 0.3  mg/dL 0.1 - -  Lab Results  Component Value Date   TSH 1.84 09/10/2017   EXAM: CHEST - 2 VIEW  COMPARISON:  12/20/2016  FINDINGS: There is airspace disease in the right upper lobe peripherally concerning for pneumonia. There is no pleural effusion or pneumothorax. The heart and mediastinal contours are unremarkable.  The osseous structures are unremarkable.  IMPRESSION: Right upper lobe pneumonia. Followup PA and lateral chest X-ray is recommended in 3-4 weeks following trial of antibiotic therapy to ensure resolution and exclude underlying malignancy.   Electronically Signed   By: Kathreen Devoid   On: 07/30/2017 12:37  Assessment and Plan:   Healthcare maintenance  Abnormal chest x-ray - Plan: DG Chest 2 View  Community acquired pneumonia, unspecified laterality - Plan: DG Chest 2 View  Overall doing better, will try to change lipitor to crestor due to muscle aches.  Health Maintenance Exam: The patient's preventative maintenance and recommended screening tests for an annual wellness exam were reviewed in full today. Brought up to date unless services declined.  Counselled on the importance of diet, exercise, and its role in overall health and mortality. The patient's FH and SH was reviewed, including their home life, tobacco status, and drug and alcohol status.  Follow-up in 1 year for physical exam or additional follow-up below.  Follow-up: No follow-ups on file. Or follow-up in 1 year if not noted.  Signed,  Maud Deed. Broderic Bara, MD   Allergies as of 09/17/2017      Reactions   Metformin And Related Nausea And Vomiting, Rash   Tramadol Hcl Other (See Comments)   Tremor vs seizure activity   Codeine Nausea And Vomiting   Gabapentin Other (See Comments)   "Messed up my nervous system"   Morphine Sulfate Nausea And Vomiting   Other Nausea And Vomiting   "Narcotics, in general"   Ticlopidine Hcl Other (See Comments)   Reaction unknown by patient  or family   Warfarin Sodium Nausea And Vomiting, Other (See Comments)   "Severe fatigue," also   Ace Inhibitors Rash, Cough   Tramadol Rash      Medication List        Accurate as of 09/17/17  9:15 AM. Always use your most recent med list.          albuterol 108 (90 Base) MCG/ACT inhaler Commonly known as:  PROVENTIL HFA;VENTOLIN HFA Inhale 2 puffs into the lungs every 6 (six) hours as needed for wheezing or shortness of breath.   aspirin EC 81 MG tablet Take 81 mg by mouth daily.   atorvastatin 40 MG tablet Commonly known as:  LIPITOR Take 40 mg by mouth at bedtime.   Carboxymethylcellulose Sod PF 0.25 % Soln Place 2 drops into both eyes 2 (two) times daily.   diphenhydramine-acetaminophen 25-500 MG Tabs tablet Commonly known as:  TYLENOL PM Take 1 tablet by mouth at bedtime as needed (for sleep).   doxazosin 8 MG tablet Commonly known as:  CARDURA Take 4 mg by mouth at bedtime.   furosemide 20 MG tablet Commonly known as:  LASIX Take 20 mg by mouth daily.   glipiZIDE 5 MG tablet Commonly known as:  GLUCOTROL Take 1 tablet (5 mg total) by mouth 2 (two) times daily before a meal.   metoprolol 200 MG 24 hr tablet Commonly known as:  TOPROL-XL Take 100 mg by mouth daily.   ONE TOUCH ULTRA TEST test strip Generic drug:  glucose blood USE TO CHECK BLOOD SUGAR 2 TIMES DAILY AS DIRECTED  ONETOUCH DELICA LANCETS 10X Misc   PRESERVISION AREDS 2 Caps Take 1 capsule by mouth daily with breakfast.   STIOLTO RESPIMAT 2.5-2.5 MCG/ACT Aers Generic drug:  Tiotropium Bromide-Olodaterol Inhale 2 puffs into the lungs daily.

## 2017-09-17 ENCOUNTER — Encounter: Payer: Self-pay | Admitting: Family Medicine

## 2017-09-17 ENCOUNTER — Ambulatory Visit (INDEPENDENT_AMBULATORY_CARE_PROVIDER_SITE_OTHER): Payer: Medicare PPO | Admitting: Family Medicine

## 2017-09-17 ENCOUNTER — Ambulatory Visit (INDEPENDENT_AMBULATORY_CARE_PROVIDER_SITE_OTHER)
Admission: RE | Admit: 2017-09-17 | Discharge: 2017-09-17 | Disposition: A | Discharge status: Home / Self Care | Payer: Medicare PPO | Source: Ambulatory Visit | Attending: Family Medicine | Admitting: Family Medicine

## 2017-09-17 VITALS — BP 110/62 | HR 63 | Temp 97.6°F | Ht 66.0 in | Wt 181.8 lb

## 2017-09-17 DIAGNOSIS — J189 Pneumonia, unspecified organism: Secondary | ICD-10-CM

## 2017-09-17 DIAGNOSIS — R9389 Abnormal findings on diagnostic imaging of other specified body structures: Secondary | ICD-10-CM

## 2017-09-17 DIAGNOSIS — Z0000 Encounter for general adult medical examination without abnormal findings: Secondary | ICD-10-CM

## 2017-09-17 MED ORDER — ROSUVASTATIN CALCIUM 40 MG PO TABS: 40.0000 mg | ORAL_TABLET | Freq: Every day | ORAL | 3 refills | Status: DC

## 2017-11-28 ENCOUNTER — Encounter (HOSPITAL_COMMUNITY): Payer: Self-pay | Admitting: *Deleted

## 2017-11-28 ENCOUNTER — Other Ambulatory Visit: Payer: Self-pay

## 2017-11-28 ENCOUNTER — Emergency Department (HOSPITAL_COMMUNITY): Payer: Medicare PPO

## 2017-11-28 ENCOUNTER — Inpatient Hospital Stay (HOSPITAL_COMMUNITY)
Admission: EM | Admit: 2017-11-28 | Discharge: 2017-12-05 | DRG: 871 | Disposition: A | Discharge status: Home Health | Payer: Medicare PPO | Attending: Internal Medicine | Admitting: Internal Medicine

## 2017-11-28 DIAGNOSIS — E1121 Type 2 diabetes mellitus with diabetic nephropathy: Secondary | ICD-10-CM | POA: Diagnosis not present

## 2017-11-28 DIAGNOSIS — Z951 Presence of aortocoronary bypass graft: Secondary | ICD-10-CM

## 2017-11-28 DIAGNOSIS — I361 Nonrheumatic tricuspid (valve) insufficiency: Secondary | ICD-10-CM | POA: Diagnosis not present

## 2017-11-28 DIAGNOSIS — H9190 Unspecified hearing loss, unspecified ear: Secondary | ICD-10-CM | POA: Diagnosis present

## 2017-11-28 DIAGNOSIS — N179 Acute kidney failure, unspecified: Secondary | ICD-10-CM | POA: Diagnosis present

## 2017-11-28 DIAGNOSIS — J440 Chronic obstructive pulmonary disease with acute lower respiratory infection: Secondary | ICD-10-CM | POA: Diagnosis not present

## 2017-11-28 DIAGNOSIS — A419 Sepsis, unspecified organism: Secondary | ICD-10-CM | POA: Diagnosis not present

## 2017-11-28 DIAGNOSIS — I5032 Chronic diastolic (congestive) heart failure: Secondary | ICD-10-CM | POA: Diagnosis not present

## 2017-11-28 DIAGNOSIS — I481 Persistent atrial fibrillation: Secondary | ICD-10-CM

## 2017-11-28 DIAGNOSIS — I4892 Unspecified atrial flutter: Secondary | ICD-10-CM | POA: Diagnosis not present

## 2017-11-28 DIAGNOSIS — F0390 Unspecified dementia without behavioral disturbance: Secondary | ICD-10-CM | POA: Diagnosis present

## 2017-11-28 DIAGNOSIS — I4891 Unspecified atrial fibrillation: Secondary | ICD-10-CM | POA: Diagnosis not present

## 2017-11-28 DIAGNOSIS — I25718 Atherosclerosis of autologous vein coronary artery bypass graft(s) with other forms of angina pectoris: Secondary | ICD-10-CM | POA: Diagnosis not present

## 2017-11-28 DIAGNOSIS — J40 Bronchitis, not specified as acute or chronic: Secondary | ICD-10-CM | POA: Diagnosis present

## 2017-11-28 DIAGNOSIS — Z7982 Long term (current) use of aspirin: Secondary | ICD-10-CM | POA: Diagnosis not present

## 2017-11-28 DIAGNOSIS — Z955 Presence of coronary angioplasty implant and graft: Secondary | ICD-10-CM

## 2017-11-28 DIAGNOSIS — I483 Typical atrial flutter: Secondary | ICD-10-CM

## 2017-11-28 DIAGNOSIS — N400 Enlarged prostate without lower urinary tract symptoms: Secondary | ICD-10-CM | POA: Diagnosis not present

## 2017-11-28 DIAGNOSIS — J189 Pneumonia, unspecified organism: Secondary | ICD-10-CM | POA: Diagnosis not present

## 2017-11-28 DIAGNOSIS — I2510 Atherosclerotic heart disease of native coronary artery without angina pectoris: Secondary | ICD-10-CM | POA: Diagnosis not present

## 2017-11-28 DIAGNOSIS — K5900 Constipation, unspecified: Secondary | ICD-10-CM | POA: Diagnosis not present

## 2017-11-28 DIAGNOSIS — J441 Chronic obstructive pulmonary disease with (acute) exacerbation: Secondary | ICD-10-CM | POA: Diagnosis present

## 2017-11-28 DIAGNOSIS — J9621 Acute and chronic respiratory failure with hypoxia: Secondary | ICD-10-CM | POA: Diagnosis present

## 2017-11-28 DIAGNOSIS — E782 Mixed hyperlipidemia: Secondary | ICD-10-CM | POA: Diagnosis present

## 2017-11-28 DIAGNOSIS — I10 Essential (primary) hypertension: Secondary | ICD-10-CM | POA: Diagnosis not present

## 2017-11-28 DIAGNOSIS — I130 Hypertensive heart and chronic kidney disease with heart failure and stage 1 through stage 4 chronic kidney disease, or unspecified chronic kidney disease: Secondary | ICD-10-CM | POA: Diagnosis not present

## 2017-11-28 DIAGNOSIS — G4730 Sleep apnea, unspecified: Secondary | ICD-10-CM | POA: Diagnosis present

## 2017-11-28 DIAGNOSIS — I25810 Atherosclerosis of coronary artery bypass graft(s) without angina pectoris: Secondary | ICD-10-CM | POA: Diagnosis present

## 2017-11-28 DIAGNOSIS — Z79899 Other long term (current) drug therapy: Secondary | ICD-10-CM

## 2017-11-28 DIAGNOSIS — I5033 Acute on chronic diastolic (congestive) heart failure: Secondary | ICD-10-CM | POA: Diagnosis not present

## 2017-11-28 DIAGNOSIS — Z7984 Long term (current) use of oral hypoglycemic drugs: Secondary | ICD-10-CM | POA: Diagnosis not present

## 2017-11-28 DIAGNOSIS — E1122 Type 2 diabetes mellitus with diabetic chronic kidney disease: Secondary | ICD-10-CM | POA: Diagnosis not present

## 2017-11-28 DIAGNOSIS — J449 Chronic obstructive pulmonary disease, unspecified: Secondary | ICD-10-CM | POA: Diagnosis not present

## 2017-11-28 DIAGNOSIS — R0602 Shortness of breath: Secondary | ICD-10-CM

## 2017-11-28 DIAGNOSIS — Z87891 Personal history of nicotine dependence: Secondary | ICD-10-CM

## 2017-11-28 DIAGNOSIS — N183 Chronic kidney disease, stage 3 (moderate): Secondary | ICD-10-CM | POA: Diagnosis present

## 2017-11-28 DIAGNOSIS — J9601 Acute respiratory failure with hypoxia: Secondary | ICD-10-CM | POA: Insufficient documentation

## 2017-11-28 DIAGNOSIS — I129 Hypertensive chronic kidney disease with stage 1 through stage 4 chronic kidney disease, or unspecified chronic kidney disease: Secondary | ICD-10-CM | POA: Diagnosis not present

## 2017-11-28 DIAGNOSIS — I252 Old myocardial infarction: Secondary | ICD-10-CM

## 2017-11-28 DIAGNOSIS — E119 Type 2 diabetes mellitus without complications: Secondary | ICD-10-CM | POA: Diagnosis not present

## 2017-11-28 LAB — I-STAT TROPONIN, ED: TROPONIN I, POC: 0.01 ng/mL (ref 0.00–0.08)

## 2017-11-28 LAB — TSH: TSH: 0.453 u[IU]/mL (ref 0.350–4.500)

## 2017-11-28 LAB — URINALYSIS, ROUTINE W REFLEX MICROSCOPIC
Bilirubin Urine: NEGATIVE
Glucose, UA: NEGATIVE mg/dL
Hgb urine dipstick: NEGATIVE
Ketones, ur: NEGATIVE mg/dL
Leukocytes, UA: NEGATIVE
Nitrite: NEGATIVE
Protein, ur: NEGATIVE mg/dL
Specific Gravity, Urine: 1.008 (ref 1.005–1.030)
pH: 5 (ref 5.0–8.0)

## 2017-11-28 LAB — BASIC METABOLIC PANEL WITH GFR
Anion gap: 14 (ref 5–15)
BUN: 32 mg/dL — ABNORMAL HIGH (ref 8–23)
CO2: 23 mmol/L (ref 22–32)
Calcium: 9.7 mg/dL (ref 8.9–10.3)
Chloride: 102 mmol/L (ref 98–111)
Creatinine, Ser: 1.89 mg/dL — ABNORMAL HIGH (ref 0.61–1.24)
GFR calc Af Amer: 35 mL/min — ABNORMAL LOW
GFR calc non Af Amer: 30 mL/min — ABNORMAL LOW
Glucose, Bld: 254 mg/dL — ABNORMAL HIGH (ref 70–99)
Potassium: 5.1 mmol/L (ref 3.5–5.1)
Sodium: 139 mmol/L (ref 135–145)

## 2017-11-28 LAB — STREP PNEUMONIAE URINARY ANTIGEN: Strep Pneumo Urinary Antigen: NEGATIVE

## 2017-11-28 LAB — CBC
HCT: 42.1 % (ref 39.0–52.0)
Hemoglobin: 13.1 g/dL (ref 13.0–17.0)
MCH: 30.8 pg (ref 26.0–34.0)
MCHC: 31.1 g/dL (ref 30.0–36.0)
MCV: 99.1 fL (ref 78.0–100.0)
PLATELETS: 174 10*3/uL (ref 150–400)
RBC: 4.25 MIL/uL (ref 4.22–5.81)
RDW: 11.9 % (ref 11.5–15.5)
WBC: 13.7 10*3/uL — ABNORMAL HIGH (ref 4.0–10.5)

## 2017-11-28 LAB — I-STAT CG4 LACTIC ACID, ED
Lactic Acid, Venous: 1.59 mmol/L (ref 0.5–1.9)
Lactic Acid, Venous: 1.15 mmol/L (ref 0.5–1.9)

## 2017-11-28 LAB — GLUCOSE, CAPILLARY: Glucose-Capillary: 287 mg/dL — ABNORMAL HIGH (ref 70–99)

## 2017-11-28 LAB — CBG MONITORING, ED: Glucose-Capillary: 203 mg/dL — ABNORMAL HIGH (ref 70–99)

## 2017-11-28 LAB — HEPARIN LEVEL (UNFRACTIONATED): HEPARIN UNFRACTIONATED: 0.25 [IU]/mL — AB (ref 0.30–0.70)

## 2017-11-28 LAB — TROPONIN I: Troponin I: <0.03 ng/mL (ref <0.03)

## 2017-11-28 LAB — BRAIN NATRIURETIC PEPTIDE: B Natriuretic Peptide: 315 pg/mL — ABNORMAL HIGH (ref 0.0–100.0)

## 2017-11-28 LAB — MAGNESIUM: Magnesium: 1.8 mg/dL (ref 1.7–2.4)

## 2017-11-28 LAB — PROCALCITONIN: Procalcitonin: 0.42 ng/mL

## 2017-11-28 MED ORDER — POLYVINYL ALCOHOL 1.4 % OP SOLN
1.0000 [drp] | Freq: Two times a day (BID) | OPHTHALMIC | Status: DC
  Administered 2017-11-28 – 2017-12-04 (×11): 1 [drp] via OPHTHALMIC
  Filled 2017-11-28: qty 15

## 2017-11-28 MED ORDER — CARBOXYMETHYLCELLULOSE SOD PF 0.25 % OP SOLN: 2.0000 [drp] | Freq: Two times a day (BID) | OPHTHALMIC | Status: DC

## 2017-11-28 MED ORDER — ASPIRIN EC 81 MG PO TBEC
81.0000 mg | DELAYED_RELEASE_TABLET | Freq: Every day | ORAL | Status: DC
  Administered 2017-11-29 – 2017-12-05 (×7): 81 mg via ORAL
  Filled 2017-11-28 (×7): qty 1

## 2017-11-28 MED ORDER — SODIUM CHLORIDE 0.9 % IV SOLN
500.0000 mg | INTRAVENOUS | Status: DC
  Administered 2017-11-28 – 2017-11-30 (×3): 500 mg via INTRAVENOUS
  Filled 2017-11-28 (×4): qty 500

## 2017-11-28 MED ORDER — SODIUM CHLORIDE 0.9 % IV BOLUS (SEPSIS)
500.0000 mL | Freq: Once | INTRAVENOUS | Status: AC
  Administered 2017-11-28: 500 mL via INTRAVENOUS

## 2017-11-28 MED ORDER — METHYLPREDNISOLONE SODIUM SUCC 125 MG IJ SOLR
125.0000 mg | INTRAMUSCULAR | Status: AC
  Administered 2017-11-28: 125 mg via INTRAVENOUS
  Filled 2017-11-28: qty 2

## 2017-11-28 MED ORDER — SODIUM CHLORIDE 0.9 % IV SOLN
2.0000 g | INTRAVENOUS | Status: DC
  Administered 2017-11-28 – 2017-11-30 (×3): 2 g via INTRAVENOUS
  Filled 2017-11-28 (×4): qty 20

## 2017-11-28 MED ORDER — HEPARIN (PORCINE) IN NACL 100-0.45 UNIT/ML-% IJ SOLN
1350.0000 [IU]/h | INTRAMUSCULAR | Status: DC
  Administered 2017-11-28: 1200 [IU]/h via INTRAVENOUS
  Administered 2017-11-29: 1350 [IU]/h via INTRAVENOUS
  Filled 2017-11-28 (×2): qty 250

## 2017-11-28 MED ORDER — ARFORMOTEROL TARTRATE 15 MCG/2ML IN NEBU
15.0000 ug | INHALATION_SOLUTION | Freq: Two times a day (BID) | RESPIRATORY_TRACT | Status: DC
  Administered 2017-11-28 – 2017-12-05 (×14): 15 ug via RESPIRATORY_TRACT
  Filled 2017-11-28 (×16): qty 2

## 2017-11-28 MED ORDER — IPRATROPIUM-ALBUTEROL 0.5-2.5 (3) MG/3ML IN SOLN
3.0000 mL | RESPIRATORY_TRACT | Status: AC
  Administered 2017-11-28: 3 mL via RESPIRATORY_TRACT
  Filled 2017-11-28: qty 3

## 2017-11-28 MED ORDER — DOXAZOSIN MESYLATE 2 MG PO TABS
4.0000 mg | ORAL_TABLET | Freq: Every day | ORAL | Status: DC
  Administered 2017-11-28 – 2017-12-04 (×7): 4 mg via ORAL
  Filled 2017-11-28 (×3): qty 2
  Filled 2017-11-28: qty 1
  Filled 2017-11-28: qty 2
  Filled 2017-11-28: qty 1
  Filled 2017-11-28 (×3): qty 2
  Filled 2017-11-28: qty 1

## 2017-11-28 MED ORDER — FUROSEMIDE 20 MG PO TABS
20.0000 mg | ORAL_TABLET | Freq: Every day | ORAL | Status: DC
  Administered 2017-11-29 – 2017-12-05 (×7): 20 mg via ORAL
  Filled 2017-11-28 (×7): qty 1

## 2017-11-28 MED ORDER — BUDESONIDE 0.5 MG/2ML IN SUSP
0.5000 mg | Freq: Two times a day (BID) | RESPIRATORY_TRACT | Status: DC
  Administered 2017-11-28 – 2017-12-05 (×14): 0.5 mg via RESPIRATORY_TRACT
  Filled 2017-11-28 (×16): qty 2

## 2017-11-28 MED ORDER — GUAIFENESIN ER 600 MG PO TB12
600.0000 mg | ORAL_TABLET | Freq: Two times a day (BID) | ORAL | Status: DC
  Administered 2017-11-28 – 2017-12-05 (×15): 600 mg via ORAL
  Filled 2017-11-28 (×15): qty 1

## 2017-11-28 MED ORDER — METHYLPREDNISOLONE SODIUM SUCC 125 MG IJ SOLR
60.0000 mg | Freq: Three times a day (TID) | INTRAMUSCULAR | Status: DC
  Administered 2017-11-29 – 2017-12-01 (×7): 60 mg via INTRAVENOUS
  Filled 2017-11-28 (×7): qty 2

## 2017-11-28 MED ORDER — INSULIN ASPART 100 UNIT/ML ~~LOC~~ SOLN
0.0000 [IU] | Freq: Three times a day (TID) | SUBCUTANEOUS | Status: DC
  Administered 2017-11-28: 5 [IU] via SUBCUTANEOUS
  Administered 2017-11-29 (×3): 8 [IU] via SUBCUTANEOUS
  Administered 2017-11-30 (×2): 15 [IU] via SUBCUTANEOUS
  Administered 2017-12-01: 8 [IU] via SUBCUTANEOUS
  Administered 2017-12-01: 2 [IU] via SUBCUTANEOUS
  Administered 2017-12-01: 5 [IU] via SUBCUTANEOUS
  Administered 2017-12-02: 8 [IU] via SUBCUTANEOUS
  Administered 2017-12-02: 3 [IU] via SUBCUTANEOUS
  Administered 2017-12-02: 8 [IU] via SUBCUTANEOUS
  Administered 2017-12-03: 3 [IU] via SUBCUTANEOUS
  Administered 2017-12-04: 11 [IU] via SUBCUTANEOUS
  Administered 2017-12-04: 8 [IU] via SUBCUTANEOUS
  Administered 2017-12-04: 5 [IU] via SUBCUTANEOUS
  Administered 2017-12-05: 3 [IU] via SUBCUTANEOUS
  Filled 2017-11-28: qty 1

## 2017-11-28 MED ORDER — ROSUVASTATIN CALCIUM 10 MG PO TABS
40.0000 mg | ORAL_TABLET | Freq: Every day | ORAL | Status: DC
  Administered 2017-11-29 – 2017-12-05 (×7): 40 mg via ORAL
  Filled 2017-11-28 (×7): qty 4

## 2017-11-28 MED ORDER — PANTOPRAZOLE SODIUM 40 MG PO TBEC
40.0000 mg | DELAYED_RELEASE_TABLET | Freq: Every day | ORAL | Status: DC
  Administered 2017-11-28 – 2017-12-05 (×8): 40 mg via ORAL
  Filled 2017-11-28 (×8): qty 1

## 2017-11-28 MED ORDER — MAGNESIUM OXIDE 400 (241.3 MG) MG PO TABS
400.0000 mg | ORAL_TABLET | Freq: Once | ORAL | Status: AC
  Administered 2017-11-28: 400 mg via ORAL
  Filled 2017-11-28: qty 1

## 2017-11-28 MED ORDER — INSULIN ASPART 100 UNIT/ML ~~LOC~~ SOLN
0.0000 [IU] | Freq: Every day | SUBCUTANEOUS | Status: DC
  Administered 2017-11-28 – 2017-11-29 (×2): 3 [IU] via SUBCUTANEOUS
  Administered 2017-11-30: 2 [IU] via SUBCUTANEOUS
  Administered 2017-12-01: 3 [IU] via SUBCUTANEOUS
  Administered 2017-12-02: 2 [IU] via SUBCUTANEOUS
  Administered 2017-12-03: 4 [IU] via SUBCUTANEOUS
  Administered 2017-12-04: 3 [IU] via SUBCUTANEOUS

## 2017-11-28 MED ORDER — METOPROLOL SUCCINATE ER 100 MG PO TB24
100.0000 mg | ORAL_TABLET | Freq: Every day | ORAL | Status: DC
  Administered 2017-11-29: 100 mg via ORAL
  Filled 2017-11-28: qty 1

## 2017-11-28 MED ORDER — IPRATROPIUM-ALBUTEROL 0.5-2.5 (3) MG/3ML IN SOLN
3.0000 mL | Freq: Four times a day (QID) | RESPIRATORY_TRACT | Status: DC
  Administered 2017-11-28 – 2017-11-29 (×3): 3 mL via RESPIRATORY_TRACT
  Filled 2017-11-28 (×3): qty 3

## 2017-11-28 MED ORDER — HEPARIN BOLUS VIA INFUSION
4000.0000 [IU] | Freq: Once | INTRAVENOUS | Status: AC
  Administered 2017-11-28: 4000 [IU] via INTRAVENOUS
  Filled 2017-11-28: qty 4000

## 2017-11-28 NOTE — ED Triage Notes (Signed)
Pt in c/o shortness of breath and palpitations that is worse this morning, reports shortness of breath for the last several months that worsened in the last few days, patient speaking in short phrases, history of COPD and CHF, reports increased swelling to bilateral ankles and feet, worse to the right

## 2017-11-28 NOTE — Progress Notes (Signed)
ANTICOAGULATION CONSULT NOTE - Initial Consult  Pharmacy Consult for heparin Indication: atrial fibrillation  Allergies  Allergen Reactions  . Metformin And Related Nausea And Vomiting and Rash  . Tramadol Hcl Other (See Comments)    Tremor vs seizure activity  . Codeine Nausea And Vomiting  . Gabapentin Other (See Comments)    "Messed up my nervous system"  . Morphine Sulfate Nausea And Vomiting  . Other Nausea And Vomiting    "Narcotics, in general"  . Ticlopidine Hcl Other (See Comments)    Reaction unknown by patient or family  . Warfarin Sodium Nausea And Vomiting and Other (See Comments)    "Severe fatigue," also  . Ace Inhibitors Rash and Cough  . Tramadol Rash    Patient Measurements: Height: 5\' 6"  (167.6 cm) Weight: 181 lb 10.5 oz (82.4 kg) IBW/kg (Calculated) : 63.8 Heparin Dosing Weight: 80.5kg  Vital Signs: Temp: 100.4 F (38 C) (08/21 0937) Temp Source: Rectal (08/21 0937) BP: 137/64 (08/21 1300) Pulse Rate: 83 (08/21 1300)  Labs: Recent Labs    11/28/17 0748  HGB 13.1  HCT 42.1  PLT 174  CREATININE 1.89*    Estimated Creatinine Clearance: 27.7 mL/min (A) (by C-G formula based on SCr of 1.89 mg/dL (H)).   Medical History: Past Medical History:  Diagnosis Date  . Allergic rhinitis   . Benign prostatic hypertrophy   . Chronic diastolic heart failure (HCC)    8/08 ECHO with EF 55%  . CKD (chronic kidney disease)   . COPD (chronic obstructive pulmonary disease) (HCC)    moderate. followed by Dr.Byrum  . Coronary artery disease    s/p CABG 2006. LHC (1/10): SVG-OM and PLOM, SVG-D, and LIMA-LAD were all patent  . Dementia    (mild)--06/2006  . Diabetes mellitus   . Edema    localized. suspect diastolic CHF + venous insufficiency  . Hearing loss    left >>right  . History of tobacco abuse   . Hypertension   . Mixed hyperlipidemia   . Osteoarthritis   . Pneumonia 07/31/2017  . Sleep apnea    ?    Medications:  Infusions:  . heparin       Assessment: 89 yom presented to the ED with SOB found to be in new aflutter. To start IV heparin for anticoagulation with plans to transition to a DOAC. Baseline CBC is WNL and he is not on anticoagulation PTA.    Goal of Therapy:  Heparin level 0.3-0.7 units/ml Monitor platelets by anticoagulation protocol: Yes   Plan:  Heparin bolus 4000 units IV x 1 Heparin gtt 1200 units/hr Check an 8 hr heparin level Daily heparin level and CBC F/u plans for DOAC  Mclain Freer, Drake Leachachel Lynn 11/28/2017,1:32 PM

## 2017-11-28 NOTE — H&P (Signed)
History and Physical    Timothy Cordova UDJ:[id number] DOB: 1928-08-16 DOA: 11/28/2017  Referring MD/NP/PA: Marda Stalker, MD PCP: Owens Loffler, MD  Patient coming from:Home  Chief Complaint: Shortness of breath  I have personally briefly reviewed patient's old medical records in [place]   HPI: Timothy Cordova is a 84 y.o. male with medical history significant of HTN, HLD, diastolic CHF last EF 58-85% with 1DD, COPD not on oxygen at baseline, CKD stage III, and remote tobacco use who presents with complaints of shortness of breath over the last 1-2 weeks.  Symptoms have waxed and waned, and until recently.  Patient reports finding himself panting just walking room to room.  He has had a cough, but reports that it is mild and nonproductive.  Reports utilizing his albuterol rescue inhaler up to 4 times daily.  He denies that his weight has been stable at 175 to 178 pounds consistently and reports taking it daily.  He thought maybe his symptoms were due to heart failure and increased his Lasix from 20 mg to 40 mg 2 days ago, but reported no change in symptoms.  Other associated symptoms include some urinary frequency, decreased oral intake, malaise, and chronic back pain.  Denies having any fever, chills, recent sick contacts, chest pain, nausea, vomiting, or dysuria symptoms.  ED Course: Upon admission to the emergency department patient was found to be febrile up to 100.4 F, heart rates 83-1 28, respiration 21-41, and O2 saturations 91 to 95% after being placed on 2 L of nasal cannula oxygen.  Labs revealed WBC 13.7, BUN 32, creatinine 1.89, BUN 315.0, troponin 0.01, and lactic acid 1.59.  Urinalysis was otherwise noted to be clear and chest x-ray did not show any focal signs of any infiltrate or edema.  Review of Systems  Constitutional: Positive for malaise/fatigue. Negative for chills and fever.  HENT: Positive for hearing loss. Negative for sinus pain.   Eyes:  Negative for photophobia and pain.  Respiratory: Positive for cough, shortness of breath and wheezing. Negative for sputum production.   Cardiovascular: Negative for chest pain and leg swelling.  Gastrointestinal: Negative for abdominal pain, nausea and vomiting.  Genitourinary: Positive for urgency.  Musculoskeletal: Positive for back pain and joint pain. Negative for falls and myalgias.  Skin: Negative for itching and rash.  Neurological: Positive for weakness. Negative for focal weakness and loss of consciousness.  Psychiatric/Behavioral: Negative for substance abuse. The patient is not nervous/anxious.     Past Medical History:  Diagnosis Date  . Allergic rhinitis   . Benign prostatic hypertrophy   . Chronic diastolic heart failure ([place])    8/08 ECHO with EF 55%  . CKD (chronic kidney disease)   . COPD (chronic obstructive pulmonary disease) ([facility])    moderate. followed by Dr.Byrum  . Coronary artery disease    s/p CABG 2006. LHC (1/10): SVG-OM and PLOM, SVG-D, and LIMA-LAD were all patent  . Dementia    (mild)--06/2006  . Diabetes mellitus   . Edema    localized. suspect diastolic CHF + venous insufficiency  . Hearing loss    left >>right  . History of tobacco abuse   . Hypertension   . Mixed hyperlipidemia   . Osteoarthritis   . Pneumonia 07/31/2017  . Sleep apnea    ?    Past Surgical History:  Procedure Laterality Date  . ANGIOPLASTY  [id number]  . CARDIAC CATHETERIZATION  90s   [place], [place] x1stent  . CARDIAC CATHETERIZATION  [facility];x2 stents  . CARDIAC CATHETERIZATION  04/29/2013  . CORONARY ARTERY BYPASS GRAFT  02/2005  . ROTATOR CUFF REPAIR  2005   left, [place]     reports that he quit smoking about 12 years ago. His smoking use included cigarettes. He has a 180.00 pack-year smoking history. He has never used smokeless tobacco. He reports that he does not drink alcohol or use drugs.  Allergies  Allergen Reactions  . Metformin And Related Nausea And  Vomiting and Rash  . Tramadol Hcl Other (See Comments)    Tremor vs seizure activity  . Codeine Nausea And Vomiting  . Gabapentin Other (See Comments)    "Messed up my nervous system"  . Morphine Sulfate Nausea And Vomiting  . Other Nausea And Vomiting    "Narcotics, in general"  . Ticlopidine Hcl Other (See Comments)    Reaction unknown by patient or family  . Warfarin Sodium Nausea And Vomiting and Other (See Comments)    "Severe fatigue," also  . Ace Inhibitors Rash and Cough  . Tramadol Rash    Family History  Problem Relation Age of Onset  . Heart attack Mother   . Lung cancer Sister   . Prostate cancer Neg Hx   . Kidney cancer Neg Hx     Prior to Admission medications   Medication Sig Start Date End Date Taking? Authorizing Provider  albuterol (PROVENTIL HFA;VENTOLIN HFA) 108 (90 Base) MCG/ACT inhaler Inhale 2 puffs into the lungs every 6 (six) hours as needed for wheezing or shortness of breath. 10/09/15  Yes Janece Canterbury, MD  aspirin EC 81 MG tablet Take 81 mg by mouth daily.   Yes [provider]  Carboxymethylcellulose Sod PF 0.25 % SOLN Place 2 drops into both eyes 2 (two) times daily.    Yes [provider]  diphenhydramine-acetaminophen (TYLENOL PM) 25-500 MG TABS tablet Take 1 tablet by mouth at bedtime as needed (for sleep).    Yes [provider]  doxazosin (CARDURA) 8 MG tablet Take 4 mg by mouth at bedtime.    Yes [provider]  furosemide (LASIX) 20 MG tablet Take 20 mg by mouth daily.   Yes [provider]  glipiZIDE (GLUCOTROL) 5 MG tablet Take 1 tablet (5 mg total) by mouth 2 (two) times daily before a meal. 08/08/17  Yes Copland, Frederico Hamman, MD  metoprolol (TOPROL-XL) 200 MG 24 hr tablet Take 100 mg by mouth daily.   Yes [provider]  Multiple Vitamins-Minerals (CENTRUM SILVER 50+MEN PO) Take 1 tablet by mouth daily.   Yes [provider]  Multiple Vitamins-Minerals (PRESERVISION AREDS 2) CAPS  Take 1 capsule by mouth daily with breakfast.    Yes [provider]  rosuvastatin (CRESTOR) 40 MG tablet Take 1 tablet (40 mg total) by mouth daily. 09/17/17  Yes Copland, Frederico Hamman, MD  Tiotropium Bromide-Olodaterol (STIOLTO RESPIMAT) 2.5-2.5 MCG/ACT AERS Inhale 2 puffs into the lungs daily.    Yes [provider]  ONE TOUCH ULTRA TEST test strip USE TO CHECK BLOOD SUGAR 2 TIMES DAILY AS DIRECTED 09/08/15   Copland, Frederico Hamman, MD  [facility] [address]  02/13/13   [provider]    Physical Exam:  Constitutional: Elderly male in moderate respiratory distress Vitals:   11/28/17 1215 11/28/17 1230 11/28/17 1245 11/28/17 1300  BP: 122/65 124/78 (!) 120/52 137/64  Pulse: 93 85 92 83  Resp: (!) 33 (!) 21 (!) 37 (!) 35  Temp:      TempSrc:  SpO2: 97% 95% 97% 96%  Weight:    82.4 kg  Height:    '5\' 6"'$  (1.676 m)   Eyes: PERRL, lids and conjunctivae normal ENMT: Mucous membranes are dry. Posterior pharynx clear of any exudate or lesions.  Neck: normal, supple, no masses, no thyromegaly Respiratory: Decreased overall aeration with positive expiratory wheezes appreciated near the lower lung fields.  Vision able to talk in talking 3-4 sentences at this time, but worsen Cardiovascular: Tachycardic, no murmurs / rubs / gallops. No extremity edema. 2+ pedal pulses. No carotid bruits.  Abdomen: no tenderness, no masses palpated. No hepatosplenomegaly. Bowel sounds positive.  Musculoskeletal: no clubbing / cyanosis. No joint deformity upper and lower extremities. Good ROM, no contractures. Normal muscle tone.  Skin: no rashes, lesions, ulcers. No induration Neurologic: CN 2-12 grossly intact. Sensation intact, DTR normal. Strength 5/5 in all 4.  Psychiatric: Normal judgment and insight. Alert and oriented x 3. Normal mood.     Labs on Admission: I have personally reviewed following labs and imaging studies  CBC: Recent Labs  Lab 11/28/17 0748  WBC 13.7*    HGB 13.1  HCT 42.1  MCV 99.1  PLT 233   Basic Metabolic Panel: Recent Labs  Lab 11/28/17 0748  NA 139  K 5.1  CL 102  CO2 23  GLUCOSE 254*  BUN 32*  CREATININE 1.89*  CALCIUM 9.7  MG 1.8   GFR: Estimated Creatinine Clearance: 27.7 mL/min (A) (by C-G formula based on SCr of 1.89 mg/dL (H)). Liver Function Tests: No results for input(s): AST, ALT, ALKPHOS, BILITOT, PROT, ALBUMIN in the last 168 hours. No results for input(s): LIPASE, AMYLASE in the last 168 hours. No results for input(s): AMMONIA in the last 168 hours. Coagulation Profile: No results for input(s): INR, PROTIME in the last 168 hours. Cardiac Enzymes: No results for input(s): CKTOTAL, CKMB, CKMBINDEX, TROPONINI in the last 168 hours. BNP (last 3 results) No results for input(s): PROBNP in the last 8760 hours. HbA1C: No results for input(s): HGBA1C in the last 72 hours. CBG: No results for input(s): GLUCAP in the last 168 hours. Lipid Profile: No results for input(s): CHOL, HDL, LDLCALC, TRIG, CHOLHDL, LDLDIRECT in the last 72 hours. Thyroid Function Tests: No results for input(s): TSH, T4TOTAL, FREET4, T3FREE, THYROIDAB in the last 72 hours. Anemia Panel: No results for input(s): VITAMINB12, FOLATE, FERRITIN, TIBC, IRON, RETICCTPCT in the last 72 hours. Urine analysis:    Component Value Date/Time   COLORURINE YELLOW 11/28/2017 0803   APPEARANCEUR CLEAR 11/28/2017 0803   APPEARANCEUR Clear 11/20/2016 1409   LABSPEC 1.008 11/28/2017 0803   PHURINE 5.0 11/28/2017 0803   GLUCOSEU NEGATIVE 11/28/2017 0803   HGBUR NEGATIVE 11/28/2017 0803   BILIRUBINUR NEGATIVE 11/28/2017 0803   BILIRUBINUR Negative 11/20/2016 1409   KETONESUR NEGATIVE 11/28/2017 0803   PROTEINUR NEGATIVE 11/28/2017 0803   NITRITE NEGATIVE 11/28/2017 0803   LEUKOCYTESUR NEGATIVE 11/28/2017 0803   LEUKOCYTESUR Negative 11/20/2016 1409   Sepsis Labs: No results found for this or any previous visit (from the past 240 hour(s)).    Radiological Exams on Admission: Dg Chest 2 View  Result Date: 11/28/2017 CLINICAL DATA:  Shortness of breath EXAM: CHEST - 2 VIEW COMPARISON:  09/17/2017 FINDINGS: Prior CABG. Heart is borderline in size. No confluent airspace opacities, effusions or edema. No acute bony abnormality. IMPRESSION: Prior CABG.  Borderline heart size.  No active disease. Electronically Signed   By: Rolm Baptise M.D.   On: 11/28/2017 08:40  EKG: Independently reviewed.  Sinus tachycardia 128 bpm  Assessment/Plan Sepsis, unknown source: Patient presents with temperature 100.4 F, tachycardia, and tachypnea.  WBC was elevated at 13.7, but lactic acid was reassuring at 1.59.  SIRS criteria met.  Chest x-ray shows no confluent opacities or signs of fluid, but patient appears clinically dry at this time.  Suspect respiratory source as cause of symptoms.   - Admit to a cardiac telemetry - Follow-up blood and sputum studies - Check respiratory virus panel - Add on procalcitonin  - 500 mL fluid bolus, will reassess status - Empiric antibiotics of ceftriaxone and azithromycin - Tylenol prn fever  Acute respiratory distress, COPD exacerbation: Patient presents with worsening shortness of breath.  Physical exam reveals minimal air movement with expiratory wheezes appreciated on physical exam near bases. - Focused COPD order set initiated - Continuous pulse oximetry with nasal oxygen as needed - Solu-Medrol 125 mg IV x1 dose now - Duonebs qid and as needed overnight - Brovana and budesonide nebs - Antibiotics as seen above  Atrial Fib/flutter: Acute. CHA2DS2-VASc score =6.  Patient was started on heparin drip given risk factors. - Heparin per pharmacy transition to oral agent when medically appropriate - Follow-up TSH - Trend cardiac troponin - Check echocardiogram - Appreciate cardiology consultative services, will follow-up for further recommendations  Acute kidney injury superimposed on chronic kidney  disease stage III: At baseline patient creatinine previously around 1.4-1.6, but on admission patient found to have creatinine of 1.89 with BUN 32.  Patient reports decreased p.o. intake and significantly dry mucous membrane. - Recheck kidney function in a.m.  Diastolic congestive heart failure: On physical exam patient clinically appears to be dry.  Notes weight unchanged with no lower extremity edema.  BNP elevated at 315.  Lungs otherwise noted to be clear with borderline heart size.  Last echocardiogram noted EF of 60 to 65% with grade 1 diastolic dysfunction 0/10/3708. - Strict I&O's and daily weights  CAD s/p CABG: Patient status post CABG in 2006 with previous placement of grafts and 2008 and 2015 - Continue aspirin, beta-blocker, statin - Trend troponins overnight  Essential hypertension - Continue metoprolol, doxazosin and furosemide  Diabetes mellitus type 2: Last hemoglobin A1c noted to be 7.7 on 07/2017.  Patient currently on oral agents of glipizide only. - Hypoglycemic protocols - Hold glipizide - CBGs q. before meals and at bedtime with moderate sliding scale insulin - Adjust regimen as needed  Hyperlipidemia - Continue Crestor  BPH - Continue doxazosin  Remote history of tobacco abuse  GI prophylaxis: Protonix  DVT prophylaxis: heparin gtt Code Status: Full Family Communication: Discussed plan of care with the patient family present at bedside Disposition Plan: Will be discharged home in 2 to 3 days Consults called: Cardiology  Admission status: inpatient  Norval Morton MD [facility] Pager [phone]   If 7PM-7AM, please contact night-coverage [email or web address] Password [facility]  11/28/2017, 1:36 PM

## 2017-11-28 NOTE — Progress Notes (Signed)
ANTICOAGULATION CONSULT NOTE   Pharmacy Consult for heparin Indication: atrial fibrillation    Patient Measurements: Height: 5\' 6"  (167.6 cm) Weight: 176 lb 11.2 oz (80.2 kg) IBW/kg (Calculated) : 63.8 Heparin Dosing Weight: 80.5kg  Vital Signs: Temp: 98.2 F (36.8 C) (08/21 2159) Temp Source: Oral (08/21 2159) BP: 122/58 (08/21 2159) Pulse Rate: 97 (08/21 2159)  Labs: Recent Labs    11/28/17 0748 11/28/17 1347 11/28/17 2052 11/28/17 2212  HGB 13.1  --   --   --   HCT 42.1  --   --   --   PLT 174  --   --   --   HEPARINUNFRC  --   --   --  0.25*  CREATININE 1.89*  --   --   --   TROPONINI  --  <0.03 <0.03  --     Estimated Creatinine Clearance: 27.4 mL/min (A) (by C-G formula based on SCr of 1.89 mg/dL (H)).     Medications:  Infusions:  . azithromycin Stopped (11/28/17 1627)  . cefTRIAXone (ROCEPHIN)  IV Stopped (11/28/17 1555)  . heparin 1,200 Units/hr (11/28/17 1426)    Assessment: 89 yom presented to the ED with SOB found to be in new aflutter. To start IV heparin for anticoagulation with plans to transition to a DOAC. Baseline CBC is WNL and he is not on anticoagulation PTA.  Initial heparin level 0.25 units/ml   Goal of Therapy:  Heparin level 0.3-0.7 units/ml Monitor platelets by anticoagulation protocol: Yes   Plan:  Heparin gtt 1350 units/hr Daily heparin level and CBC F/u plans for DOAC  Nyla Creason [place]et 11/28/2017,11:29 PM

## 2017-11-28 NOTE — Consult Note (Addendum)
Cardiology Consult   Patient ID: Timothy Cordova; MRN: [id number]; DOB: 07/23/1928   Admission date: 11/28/2017  Primary Care Provider: Hannah Beat, MD Primary Cardiologist: Julien Nordmann, MD  Referring provider: Dr. Madelyn Flavors  Chief Complaint: Shortness of breath  Patient Profile:   Timothy Cordova is a 84 y.o. male with a history of CAD s/p CABG 2006, patent grafts in 2010 & 2015, diastolic CHF, CKD stage III, hypertension, severe COPD, long history of smoking, diabetes, chronic shortness of breath, left subclavian artery stent and mild dementia who presented to the hospital with complaints of shortness of breath and orthopnea.  He was found to be in atrial flutter which appears to be new.  Timothy Cordova is a 84 y.o. male who is being seen today for the evaluation of severe dyspnea  at the request of  Dr. Madelyn Flavors   History of Present Illness:   Timothy Cordova has had a 2-3-week history of dyspnea, which he first attributed to his COPD.  The dyspnea became progressively worse to dyspnea at rest with orthopnea and he was unable to sleep last night, having to sit up in a recliner.  He now feels like the symptoms are similar to his heart failure symptoms from when he was hospitalized last year.  He took an extra Lasix this morning but has not had improvement.  Upon presentation to the ED he was found to be in atrial flutter with rates in the 110s.  He denies any prior history of A. fib/flutter.  He does admit that he has had intermittent palpitations, sometimes waking him at night for the past several months.  He did not seek treatment for this.  He has had no chest pain/pressure or lightheadedness or presyncope.  The patient denies any history of bleeding in the past.  He has chronic lower extremity edema right worse than left, his right leg was used for vein harvest for his CABG.  He says that his edema is not worse than usual and his weight has been stable with no  recent weight gain.  Timothy Cordova is febrile with slightly elevated WBC.  He denies any recent illness or signs of infection.  He complained to the ED physician of dysuria but his urinalysis did not show any abnormal findings.  The chest x-ray is not concerning for pneumonia but the patient has very diminished lung sounds with scattered rhonchi and crackles in the bases.  He is hypoxic, requiring oxygen.  He does not use oxygen at home.  Timothy Cordova is followed in our office by Dr. Mariah Milling and was last seen on 10/31/2016 at which time he was having no anginal symptoms, BP was well controlled, cholesterol at goal, heart failure was stable.   EKG: Atrial flutter, 126 then 114 bpm, with IVCD, consider atypical right bundle branch block, LVH with IVCD, LAD and secondary repolarization abnormality Chest x-ray shows prior CABG, borderline heart size, no active disease. BNP 315 Troponin 0 0.01 Potassium 5.1 Serum creatinine 1.89   Past Medical History:  Diagnosis Date  . Allergic rhinitis   . Benign prostatic hypertrophy   . Chronic diastolic heart failure (HCC)    8/08 ECHO with EF 55%  . CKD (chronic kidney disease)   . COPD (chronic obstructive pulmonary disease) (HCC)    moderate. followed by Dr.Byrum  . Coronary artery disease    s/p CABG 2006. LHC (1/10): SVG-OM and PLOM, SVG-D, and LIMA-LAD were all patent  . Dementia    (  mild)--06/2006  . Diabetes mellitus   . Edema    localized. suspect diastolic CHF + venous insufficiency  . Hearing loss    left >>right  . History of tobacco abuse   . Hypertension   . Mixed hyperlipidemia   . Osteoarthritis   . Pneumonia 07/31/2017  . Sleep apnea    ?    Past Surgical History:  Procedure Laterality Date  . ANGIOPLASTY  [id number]  . [place]  . CARDIAC CATHETERIZATION     [facility];x2 stents  . CARDIAC CATHETERIZATION  04/29/2013  . CORONARY ARTERY BYPASS GRAFT  02/2005  . ROTATOR CUFF REPAIR  2005    left, [place]     Medications Prior to Admission: Prior to Admission medications   Medication Sig Start Date End Date Taking? Authorizing Provider  albuterol (PROVENTIL HFA;VENTOLIN HFA) 108 (90 Base) MCG/ACT inhaler Inhale 2 puffs into the lungs every 6 (six) hours as needed for wheezing or shortness of breath. 10/09/15  Yes Renae FickleShort, Mackenzie, MD  aspirin EC 81 MG tablet Take 81 mg by mouth daily.   Yes [provider]  Carboxymethylcellulose Sod PF 0.25 % SOLN Place 2 drops into both eyes 2 (two) times daily.    Yes [provider]  diphenhydramine-acetaminophen (TYLENOL PM) 25-500 MG TABS tablet Take 1 tablet by mouth at bedtime as needed (for sleep).    Yes [provider]  doxazosin (CARDURA) 8 MG tablet Take 4 mg by mouth at bedtime.    Yes [provider]  furosemide (LASIX) 20 MG tablet Take 20 mg by mouth daily.   Yes [provider]  glipiZIDE (GLUCOTROL) 5 MG tablet Take 1 tablet (5 mg total) by mouth 2 (two) times daily before a meal. 08/08/17  Yes Copland, Karleen HampshireSpencer, MD  metoprolol (TOPROL-XL) 200 MG 24 hr tablet Take 100 mg by mouth daily.   Yes [provider]  Multiple Vitamins-Minerals (CENTRUM SILVER 50+MEN PO) Take 1 tablet by mouth daily.   Yes [provider]  Multiple Vitamins-Minerals (PRESERVISION AREDS 2) CAPS Take 1 capsule by mouth daily with breakfast.    Yes [provider]  rosuvastatin (CRESTOR) 40 MG tablet Take 1 tablet (40 mg total) by mouth daily. 09/17/17  Yes Copland, Karleen HampshireSpencer, MD  Tiotropium Bromide-Olodaterol (STIOLTO RESPIMAT) 2.5-2.5 MCG/ACT AERS Inhale 2 puffs into the lungs daily.    Yes [provider]  ONE TOUCH ULTRA TEST test strip USE TO CHECK BLOOD SUGAR 2 TIMES DAILY AS DIRECTED 09/08/15   Copland, Karleen HampshireSpencer, MD  [facility] DEL[facility] LANCETS 33G MISC  02/13/13   [provider]     Allergies:    Allergies  Allergen Reactions  . Metformin And Related Nausea And Vomiting  and Rash  . Tramadol Hcl Other (See Comments)    Tremor vs seizure activity  . Codeine Nausea And Vomiting  . Gabapentin Other (See Comments)    "Messed up my nervous system"  . Morphine Sulfate Nausea And Vomiting  . Other Nausea And Vomiting    "Narcotics, in general"  . Ticlopidine Hcl Other (See Comments)    Reaction unknown by patient or family  . Warfarin Sodium Nausea And Vomiting and Other (See Comments)    "Severe fatigue," also  . Ace Inhibitors Rash and Cough  . Tramadol Rash    Social History:   Social History   Socioeconomic History  . Marital status: Married    Spouse name: Not on file  .  Number of children: Not on file  . Years of education: Not on file  . Highest education level: Not on file  Occupational History  . Occupation: Research officer, trade union: RETIRED    Comment: RETIRED  Social Needs  . Financial resource strain: Not on file  . Food insecurity:    Worry: Not on file    Inability: Not on file  . Transportation needs:    Medical: Not on file    Non-medical: Not on file  Tobacco Use  . Smoking status: Former Smoker    Packs/day: 3.00    Years: 60.00    Pack years: 180.00    Types: Cigarettes    Last attempt to quit: 07/12/2005    Years since quitting: 12.3  . Smokeless tobacco: Never Used  Substance and Sexual Activity  . Alcohol use: No    Alcohol/week: 0.0 standard drinks  . Drug use: No  . Sexual activity: Not on file  Lifestyle  . Physical activity:    Days per week: Not on file    Minutes per session: Not on file  . Stress: Not on file  Relationships  . Social connections:    Talks on phone: Not on file    Gets together: Not on file    Attends religious service: Not on file    Active member of club or organization: Not on file    Attends meetings of clubs or organizations: Not on file    Relationship status: Not on file  . Intimate partner violence:    Fear of current or ex partner: Not on file    Emotionally abused:  Not on file    Physically abused: Not on file    Forced sexual activity: Not on file  Other Topics Concern  . Not on file  Social History Narrative  . Not on file    Family History:   The patient's family history includes Heart attack in his mother; Lung cancer in his sister. There is no history of Prostate cancer or Kidney cancer.    ROS:  Please see the history of present illness.  All other ROS reviewed and negative.     Physical Exam/Data:   Vitals:   11/28/17 1145 11/28/17 1200 11/28/17 1215 11/28/17 1230  BP: 123/62 129/65 122/65 124/78  Pulse: 93 90 93 85  Resp:  (!) 33 (!) 33 (!) 21  Temp:      TempSrc:      SpO2: 95% 95% 97% 95%   No intake or output data in the 24 hours ending 11/28/17 1255 There were no vitals filed for this visit. There is no height or weight on file to calculate BMI.  General:  Elderly male, appears short of breath HEENT: normal Lymph: no adenopathy Neck: 1 cm JVD Endocrine:  No thryomegaly Vascular: No carotid bruits; FA pulses 2+ bilaterally Cardiac:  normal S1, S2; irregularly irregular rhythm; no murmur  Lungs: Diminished breath sounds throughout, faint end expiratory wheezes, scattered rhonchi, basilar crackles Abd: soft, nontender, no hepatomegaly  Ext: Trace lower leg edema, R>L Musculoskeletal:  No deformities, BUE and BLE strength normal and equal Skin: warm and dry  Neuro:  CNs 2-12 intact, no focal abnormalities noted Psych:  Normal affect    EKG:  The ECG that was done was personally reviewed and demonstrates Atrial flutter, 126 then 114 bpm, with IVCD, consider atypical right bundle branch block, LVH with IVCD, LAD and secondary repolarization abnormality   Relevant CV  Studies:  Echocardiogram 10/16/2016 Study Conclusions - Left ventricle: The cavity size was normal. Wall thickness was   increased in a pattern of mild LVH. Systolic function was normal.   The estimated ejection fraction was in the range of 60% to 65%.    Wall motion was normal; there were no regional wall motion   abnormalities. Doppler parameters are consistent with abnormal   left ventricular relaxation (grade 1 diastolic dysfunction). - Mitral valve: Calcified annulus. Mildly thickened leaflets . - Left atrium: The atrium was mildly dilated. - Pulmonary arteries: Systolic pressure was mildly increased. PA   peak pressure: 42 mm Hg (S).  Impressions: - Normal LV systolic function; mild diastolic dysfunction; mild   LVH; mild LAE; trace TR with mildly elevated pulmonary pressure.  Laboratory Data:  Chemistry Recent Labs  Lab 11/28/17 0748  NA 139  K 5.1  CL 102  CO2 23  GLUCOSE 254*  BUN 32*  CREATININE 1.89*  CALCIUM 9.7  GFRNONAA 30*  GFRAA 35*  ANIONGAP 14    No results for input(s): PROT, ALBUMIN, AST, ALT, ALKPHOS, BILITOT in the last 168 hours. Hematology Recent Labs  Lab 11/28/17 0748  WBC 13.7*  RBC 4.25  HGB 13.1  HCT 42.1  MCV 99.1  MCH 30.8  MCHC 31.1  RDW 11.9  PLT 174   Cardiac EnzymesNo results for input(s): TROPONINI in the last 168 hours.  Recent Labs  Lab 11/28/17 0757  TROPIPOC 0.01    BNP Recent Labs  Lab 11/28/17 0748  BNP 315.0*    DDimer No results for input(s): DDIMER in the last 168 hours.  Radiology/Studies:  Dg Chest 2 View  Result Date: 11/28/2017 CLINICAL DATA:  Shortness of breath EXAM: CHEST - 2 VIEW COMPARISON:  09/17/2017 FINDINGS: Prior CABG. Heart is borderline in size. No confluent airspace opacities, effusions or edema. No acute bony abnormality. IMPRESSION: Prior CABG.  Borderline heart size.  No active disease. Electronically Signed   By: Charlett NoseKevin  Dover M.D.   On: 11/28/2017 08:40    Assessment and Plan:   New atrial A. Fib/flutter -Patient with 2 to 3-week history of worsening dyspnea and orthopnea.  No chest pain.  He has had intermittent palpitations, sometimes awakening him at night, for the last few months. No prior hx of afib. -EKG shows atrial flutter  with rates at 129 then 114 bpm.  Telemetry shows A. fib/flutter with rates currently in the 90s. -Potassium is 5.1 and magnesium is 1.8.  -Check TSH -CHA2DS2/VAS Stroke Risk Score is 6 (CHF, CAD, HTN, age (2), DM). Will start IV heparin for anticoagulation and plan to transition to a DOAC once no need for invasive procedure (like cath if echo abnormal).  -The patient may be having a COPD exacerbation which trigger the afib and subsequent CHF.  It is unclear when his arrhythmia began and whether this is a result of an infectious process or whether the arrhythmia led to his current symptoms. -Recommend admit patient to medical service, investigate possible infectious process, rate control for A. Fib/flutter.  If we decide to do electrical cardioversion, would need TEE as it is unclear how long he has been in aflutter/fib, possibly for several weeks at least.  -Rate is fairly controlled in the 90's. Continue his Toprol. -Continue to trend troponins.  Acute on chronic diastolic CHF -Last echo in 10/2016 showed mild LVH, EF 60-65%, grade 1 diastolic dysfunction, mildly dilated left atrium -Presented with progressively worsened shortness of breath and orthopnea. -BNP is 315.  Chest x-ray does not show overt edema. -Patient is treated at home with Lasix 20 mg daily, Toprol-XL 100 mg daily.  He took Lasix 40 mg this morning with no improvement. -Lung sounds are diminished with basilar crackles. -No recent change in weight or lower extremity edema per patient. -Recheck echocardiogram for LV and RV function -He does not look to be significantly volume overloaded. His symptoms could be mediated by COPD exacerbation and to a lesser extent the afib.  -We will diurese gently, watching renal function.  Fever and leukocytosis/COPD exacerbation -Patient is febrile with temperature of 100.56F.   -WBCs are 13.7  -UA was negative -The patient's lung sounds are diminished, tight.  He has scattered rhonchi, but  chest x-ray does not suggest pneumonia.   CKD stage III -Baseline creatinine appears to be~1.4-1.7.  Current creatinine is 1.89 -Monitor renal function closely with diuresis  CAD -s/p CABG 2006, patent grafts in 2010 & 2015 -Patient is treated with beta-blocker, aspirin 81 mg, statin -Denies chest pain or pressure.  First troponin is negative.  Hyperlipidemia -On Crestor 40 mg daily, LDL in 09/2017 was 68, at goal of <70.  Continue current therapy.  Diabetes type 2 not on insulin -Last A1c in 07/2017 was 7.7. This is acceptable considering his age and he sometimes has low blood sugars. Would not try for tighter control.   For questions or updates, please contact [facility] Please consult [email or web address] for contact info under Cardiology/STEMI.    Signed, Berton Bon, NP  11/28/2017 12:55 PM   Attending Note:   The patient was seen and examined.  Agree with assessment and plan as noted above.  Changes made to the above note as needed.  Patient seen and independently examined with Lizabeth Leyden, NP .   We discussed all aspects of the encounter. I agree with the assessment and plan as stated above.  1.  Increasing shortness of breath:   The patient presents with what sounds like and appears like a COPD exacerbation.    Is a mild elevation of his white blood cell count and has a fever.  There is very little evidence that this is a CHF exacerbation.  He does not have elevated neck veins.  He is moving very little air on exam.  Has markedly increase lung sounds.  He only has trace edema in his right leg which is the site of his saphenous vein harvest site.  I would suggest admission by the internal medicine team with aggressive treat with of his COPD exacerbation. It is possible that his new atrial fibrillation is contributing to this shortness of breath although his rate is very well controlled.  We have given him 1 additional dose of Lasix.  We will need to watch his creatinine  closely.  2.  Atrial fibrillation: He has a new diagnosis of atrial fibrillation. We will treat him with heparin for now so that we can do procedures if needed.  We will transition him to Eliquis in the next day or so.   I have spent a total of 40 minutes with patient reviewing hospital  notes , telemetry, EKGs, labs and examining patient as well as establishing an assessment and plan that was discussed with the patient. > 50% of time was spent in direct patient care.  3.  Coronary artery disease: He is not having any episodes of angina.  He has a history of coronary artery bypass grafting in 2006.  His last heart catheterization was in 2015 which  revealed patent grafts.  4.  Essential hypertension: Blood pressure is basically normal.       Vesta Mixer, Montez Hageman., MD, [facility] 11/28/2017, 1:51 PM [address]  [address] Office [phone] Pager [phone]

## 2017-11-28 NOTE — ED Provider Notes (Addendum)
[facility] Note   CSN: [id number] Arrival date & time: 11/28/17  0741     History   Chief Complaint Chief Complaint  Patient presents with  . Shortness of Breath    HPI Timothy Cordova is a 84 y.o. male.  The history is provided by the patient, a relative and medical records.  Shortness of Breath  This is a recurrent problem. The average episode lasts 1 week. The problem occurs continuously.The current episode started more than 1 week ago. The problem has been rapidly worsening. Associated symptoms include leg swelling (chronic). Pertinent negatives include no fever, no headaches, no neck pain, no cough, no sputum production, no hemoptysis, no wheezing, no chest pain, no vomiting, no abdominal pain and no leg pain. He has tried nothing for the symptoms. The treatment provided no relief. Associated medical issues include COPD, chronic lung disease, CAD, heart failure and past MI. Associated medical issues do not include PE or DVT.    Past Medical History:  Diagnosis Date  . Allergic rhinitis   . Benign prostatic hypertrophy   . Chronic diastolic heart failure (HCC)    8/08 ECHO with EF 55%  . CKD (chronic kidney disease)   . COPD (chronic obstructive pulmonary disease) (HCC)    moderate. followed by Dr.Byrum  . Coronary artery disease    s/p CABG 2006. LHC (1/10): SVG-OM and PLOM, SVG-D, and LIMA-LAD were all patent  . Dementia    (mild)--06/2006  . Diabetes mellitus   . Edema    localized. suspect diastolic CHF + venous insufficiency  . Hearing loss    left >>right  . History of tobacco abuse   . Hypertension   . Mixed hyperlipidemia   . Osteoarthritis   . Pneumonia 07/31/2017  . Sleep apnea    ?    Patient Active Problem List   Diagnosis Date Noted  . COPD (chronic obstructive pulmonary disease) (HCC) 07/30/2017  . Pulmonary HTN (HCC) 07/30/2017  . Solitary pulmonary nodule 12/10/2015  . Anemia 11/26/2014  .  Aortic calcification, 04/20/2005 CT Chest 04/03/2013  . Sleep apnea   . Tobacco abuse (in remission)   . Chronic low back pain 04/18/2011  . Renal lesion 03/27/2011  . CKD (chronic kidney disease) stage 3, GFR 30-59 ml/min (HCC) 08/18/2010  . OSTEOARTHRITIS 12/06/2009  . DIASTOLIC HEART FAILURE, CHRONIC 08/18/2009  . Mixed hyperlipidemia 06/11/2008  . CAD, AUTOLOGOUS BYPASS GRAFT 06/07/2008  . DM (diabetes mellitus), type 2 with renal complications (HCC) 09/04/2007  . ALLERGIC RHINITIS 01/30/2007  . HEARING LOSS 07/06/2006  . Essential hypertension 07/06/2006  . BENIGN PROSTATIC HYPERTROPHY 07/06/2006  . Centrilobular emphysema (HCC) 08/08/2005    Past Surgical History:  Procedure Laterality Date  . ANGIOPLASTY  [id number]  . [place]  . CARDIAC CATHETERIZATION     [facility];x2 stents  . CARDIAC CATHETERIZATION  04/29/2013  . CORONARY ARTERY BYPASS GRAFT  02/2005  . ROTATOR CUFF REPAIR  2005   left, [place]        Home Medications    Prior to Admission medications   Medication Sig Start Date End Date Taking? Authorizing Provider  albuterol (PROVENTIL HFA;VENTOLIN HFA) 108 (90 Base) MCG/ACT inhaler Inhale 2 puffs into the lungs every 6 (six) hours as needed for wheezing or shortness of breath. 10/09/15   Renae FickleShort, Mackenzie, MD  aspirin EC 81 MG tablet Take 81 mg by mouth daily.    [provider]  Carboxymethylcellulose Sod PF 0.25 % SOLN Place 2 drops into both eyes 2 (two) times daily.     [provider]  diphenhydramine-acetaminophen (TYLENOL PM) 25-500 MG TABS tablet Take 1 tablet by mouth at bedtime as needed (for sleep).     [provider]  doxazosin (CARDURA) 8 MG tablet Take 4 mg by mouth at bedtime.     [provider]  furosemide (LASIX) 20 MG tablet Take 20 mg by mouth daily.    [provider]  glipiZIDE (GLUCOTROL) 5 MG tablet Take 1 tablet (5 mg total) by mouth 2 (two) times daily  before a meal. 08/08/17   Copland, Karleen Hampshire, MD  metoprolol (TOPROL-XL) 200 MG 24 hr tablet Take 100 mg by mouth daily.    [provider]  Multiple Vitamins-Minerals (PRESERVISION AREDS 2) CAPS Take 1 capsule by mouth daily with breakfast.     [provider]  ONE TOUCH ULTRA TEST test strip USE TO CHECK BLOOD SUGAR 2 TIMES DAILY AS DIRECTED 09/08/15   Copland, Karleen Hampshire, MD  [facility] DELICA LANCETS 33G MISC  02/13/13   [provider]  rosuvastatin (CRESTOR) 40 MG tablet Take 1 tablet (40 mg total) by mouth daily. 09/17/17   Copland, Karleen Hampshire, MD  Tiotropium Bromide-Olodaterol (STIOLTO RESPIMAT) 2.5-2.5 MCG/ACT AERS Inhale 2 puffs into the lungs daily.     [provider]    Family History Family History  Problem Relation Age of Onset  . Heart attack Mother   . Lung cancer Sister   . Prostate cancer Neg Hx   . Kidney cancer Neg Hx     Social History Social History   Tobacco Use  . Smoking status: Former Smoker    Packs/day: 3.00    Years: 60.00    Pack years: 180.00    Types: Cigarettes    Last attempt to quit: 07/12/2005    Years since quitting: 12.3  . Smokeless tobacco: Never Used  Substance Use Topics  . Alcohol use: No    Alcohol/week: 0.0 standard drinks  . Drug use: No     Allergies   Metformin and related; Tramadol hcl; Codeine; Gabapentin; Morphine sulfate; Other; Ticlopidine hcl; Warfarin sodium; Ace inhibitors; and Tramadol   Review of Systems Review of Systems  Constitutional: Positive for fatigue. Negative for chills, diaphoresis and fever.  HENT: Negative for congestion.   Eyes: Negative for visual disturbance.  Respiratory: Positive for shortness of breath. Negative for cough, hemoptysis, sputum production, choking, chest tightness, wheezing and stridor.   Cardiovascular: Positive for palpitations and leg swelling (chronic). Negative for chest pain.  Gastrointestinal: Negative for abdominal pain, constipation, diarrhea, nausea  and vomiting.  Genitourinary: Positive for dysuria and urgency. Negative for frequency and hematuria.  Musculoskeletal: Negative for back pain, neck pain and neck stiffness.  Neurological: Negative for light-headedness, numbness and headaches.  Psychiatric/Behavioral: Negative for agitation.  All other systems reviewed and are negative.    Physical Exam Updated Vital Signs BP 133/74 (BP Location: Right Arm)   Pulse (!) 128   Temp 99 F (37.2 C) (Oral)   Resp (!) 30   SpO2 95%   Physical Exam  Constitutional: He appears well-developed and well-nourished.  Non-toxic appearance. He does not appear ill. No distress.  HENT:  Head: Normocephalic and atraumatic.  Nose: Nose normal.  Mouth/Throat: Oropharynx is clear and moist. No oropharyngeal exudate.  Eyes: Pupils are equal, round, and reactive to light. Conjunctivae are normal.  Neck: Neck supple.  Cardiovascular: Regular rhythm. Tachycardia  present.  No murmur heard. Pulmonary/Chest: Effort normal. Tachypnea noted. No respiratory distress. He has wheezes (mild). He has no rhonchi. He has rales. He exhibits no tenderness.  Abdominal: Soft. There is no tenderness. There is no guarding.  Musculoskeletal: He exhibits edema. He exhibits no tenderness.       Right lower leg: He exhibits edema.       Left lower leg: He exhibits edema.  Neurological: He is alert.  Skin: Skin is warm and dry. Capillary refill takes less than 2 seconds. He is not diaphoretic. No erythema. No pallor.  Psychiatric: He has a normal mood and affect.  Nursing note and vitals reviewed.    ED Treatments / Results  Labs (all labs ordered are listed, but only abnormal results are displayed) Labs Reviewed  URINE CULTURE  BASIC METABOLIC PANEL  CBC  BRAIN NATRIURETIC PEPTIDE  URINALYSIS, ROUTINE W REFLEX MICROSCOPIC  MAGNESIUM  I-STAT TROPONIN, ED  I-STAT CG4 LACTIC ACID, ED    EKG EKG Interpretation  Date/Time:  Wednesday November 28 2017 07:46:15  EDT Ventricular Rate:  129 PR Interval:  120 QRS Duration: 142 QT Interval:  332 QTC Calculation: 486 R Axis:   -70 Text Interpretation:  Sinus tachycardia Left axis deviation Right bundle branch block Left ventricular hypertrophy with repolarization abnormality Anterior infarct , age undetermined Abnormal ECG When compared to prior, faster rate.  No STEMI Confirmed by Theda Belfastegeler, Chris ([id number]) on 11/28/2017 7:52:13 AM Also confirmed by Theda Belfastegeler, Chris ([id number]), editor Sheppard EvensSimpson, Miranda [phone])  on 11/28/2017 7:55:31 AM   Radiology Dg Chest 2 View  Result Date: 11/28/2017 CLINICAL DATA:  Shortness of breath EXAM: CHEST - 2 VIEW COMPARISON:  09/17/2017 FINDINGS: Prior CABG. Heart is borderline in size. No confluent airspace opacities, effusions or edema. No acute bony abnormality. IMPRESSION: Prior CABG.  Borderline heart size.  No active disease. Electronically Signed   By: Charlett NoseKevin  Dover M.D.   On: 11/28/2017 08:40    Procedures Procedures (including critical care time)  CHA2Ds2-VASc Score for Atrial Fibrillation    Patient Score  Age <65 = 0 65-74 = 1 > 75 = 2 2  Sex Male = 0 Male = 1 0  CHF History No = 0  Yes = 1 1  HTN History No = 0  Yes = 1 1  Stroke/TIA/TE History No = 0  Yes = 1 0  Vascular Disease History No = 0  Yes = 1 0  Diabetes History No = 0  Yes = 1 1  Total:  5   7.2 % stroke rate/year from a score of 5  CRITICAL CARE Performed by: Canary Brimhristopher J Tegeler Total critical care time: 35 minutes Critical care time was exclusive of separately billable procedures and treating other patients. Critical care was necessary to treat or prevent imminent or life-threatening deterioration. Critical care was time spent personally by me on the following activities: development of treatment plan with patient and/or surrogate as well as nursing, discussions with consultants, evaluation of patient's response to treatment, examination of patient, obtaining history from patient or  surrogate, ordering and performing treatments and interventions, ordering and review of laboratory studies, ordering and review of radiographic studies, pulse oximetry and re-evaluation of patient's condition.   Medications Ordered in ED Medications  heparin ADULT infusion 100 units/mL (25000 units/26450mL sodium chloride 0.45%) (1,200 Units/hr Intravenous New Bag/Given 11/28/17 1426)  metoprolol succinate (TOPROL-XL) 24 hr tablet 100 mg (has no administration in time range)  rosuvastatin (CRESTOR) tablet 40 mg (has  no administration in time range)  Carboxymethylcellulose Sod PF 0.25 % SOLN 2 drop (has no administration in time range)  insulin aspart (novoLOG) injection 0-15 Units (has no administration in time range)  insulin aspart (novoLOG) injection 0-5 Units (has no administration in time range)  sodium chloride 0.9 % bolus 500 mL (500 mLs Intravenous New Bag/Given 11/28/17 1525)  cefTRIAXone (ROCEPHIN) 2 g in sodium chloride 0.9 % 100 mL IVPB (2 g Intravenous New Bag/Given 11/28/17 1522)  azithromycin (ZITHROMAX) 500 mg in sodium chloride 0.9 % 250 mL IVPB (500 mg Intravenous New Bag/Given 11/28/17 1525)  arformoterol (BROVANA) nebulizer solution 15 mcg (has no administration in time range)  guaiFENesin (MUCINEX) 12 hr tablet 600 mg (600 mg Oral Given 11/28/17 1526)  ipratropium-albuterol (DUONEB) 0.5-2.5 (3) MG/3ML nebulizer solution 3 mL (has no administration in time range)  budesonide (PULMICORT) nebulizer solution 0.5 mg (has no administration in time range)  pantoprazole (PROTONIX) EC tablet 40 mg (40 mg Oral Given 11/28/17 1526)  magnesium oxide (MAG-OX) tablet 400 mg (400 mg Oral Given 11/28/17 1323)  heparin bolus via infusion 4,000 Units (4,000 Units Intravenous Bolus from Bag 11/28/17 1427)  ipratropium-albuterol (DUONEB) 0.5-2.5 (3) MG/3ML nebulizer solution 3 mL (3 mLs Nebulization Given 11/28/17 1431)  methylPREDNISolone sodium succinate (SOLU-MEDROL) 125 mg/2 mL injection 125 mg (125  mg Intravenous Given 11/28/17 1422)     Initial Impression / Assessment and Plan / ED Course  I have reviewed the triage vital signs and the nursing notes.  Pertinent labs & imaging results that were available during my care of the patient were reviewed by me and considered in my medical decision making (see chart for details).     Timothy Cordova is a 84 y.o. male with a past medical history significant for CAD status post PCI and CABG, hypertension, CHF, COPD, hyperlipidemia, diabetes, CKD, and dementia who presents with shortness of breath.  Patient reports that he feels fluid overloaded and he is having a heart failure exacerbation.  He says that for the last week he has had worsening shortness of breath with any exertion.  He reports that his legs have been slightly more swollen than baseline and he has been taking his Lasix as directed.  He reports even took a double dose today with 40 mg before coming to the ED.  He reports no chest pain or chest pressure.  He denies fevers, chills, congestion, or cough.  He denies any nausea, vomiting, diaphoresis or traumas.  He denies any recent other medication changes.  He denies other complaints.  He does report dysuria and urgency.  On exam, patient is tachycardic on arrival.  Patient's pulse was regular.  Patient had crackles in the bases of his lungs.  Minimal wheezing.  No rhonchi.  Abdomen and chest are nontender.  Legs were slightly edematous bilaterally.  Patient reports chronic right leg swelling compared to left for years.  No history of DVT or PE.  Patient is tachypneic and tachycardic.    Clinically I am concerned about CHF exacerbation given patient's history of similar and his description of symptoms.  Lower suspicion for ACS given his lack of chest pain or chest pressure.  Anticipat laboratory testing and imaging.  Anticipate patient will require admission.  8:16 AM Repeat EKG was performed after telemetry looked irregular.  New EKG  shows evidence of a flutter with his right bundle branch block.  I suspect patient's new a flutter is causing his palpitations and worsened shortness of breath.  10:02 AM Patient's laboratory testing began to return.  Urinalysis shows no evidence of infection and lactic acid is negative.  Initial troponin was negative.  BNP however was elevated back over 300 as it was a year ago.  Metabolic panel revealed slightly worsened kidney function from prior.  Electrolytes otherwise reassuring.  Mild leukocytosis.  Magnesium within normal limits.  Chest x-ray shows no evidence of pneumonia and has portal and heart size.  Rectal temp was positive for fever.  Unclear etiology of the patient's fever however I suspect the patient's exertional shortness of breath is due to his fluid overload with CHF exacerbation and his new A. fib/flutter.  Cardiology team will be called however I suspect patient will be admitted to medicine service.  1:27 PM Cardiology came to the patient and feels this is likely combination of COPD and CHF causing him to go into the a flutter.  They recommended admission to medicine.  They will order heparin and an echo as well as troponin trending.  Still a question of possible infection given the fever, tachycardia, tachypnea, and cough although chest x-ray and urinalysis are clear.  Will defer to medicine team as to antibiotics or not.  Medicine team called for admission.   Final Clinical Impressions(s) / ED Diagnoses   Final diagnoses:  Atrial fibrillation and flutter (HCC)  SOB (shortness of breath)  Exertional shortness of breath    ED Discharge Orders    None      Clinical Impression: 1. Atrial fibrillation and flutter (HCC)   2. SOB (shortness of breath)   3. Exertional shortness of breath     Disposition: Admit  This note was prepared with assistance of Dragon voice recognition software. Occasional wrong-word or sound-a-like substitutions may have occurred due to  the inherent limitations of voice recognition software.     Tegeler, Canary Brim, MD 11/28/17 1547    Tegeler, Canary Brim, MD 12/10/17 1134

## 2017-11-28 NOTE — ED Notes (Signed)
Dr. Smith at bedside.

## 2017-11-29 ENCOUNTER — Other Ambulatory Visit (HOSPITAL_COMMUNITY): Payer: Medicare PPO

## 2017-11-29 DIAGNOSIS — I4892 Unspecified atrial flutter: Secondary | ICD-10-CM

## 2017-11-29 DIAGNOSIS — J441 Chronic obstructive pulmonary disease with (acute) exacerbation: Secondary | ICD-10-CM

## 2017-11-29 DIAGNOSIS — A419 Sepsis, unspecified organism: Principal | ICD-10-CM

## 2017-11-29 DIAGNOSIS — N179 Acute kidney failure, unspecified: Secondary | ICD-10-CM

## 2017-11-29 DIAGNOSIS — E119 Type 2 diabetes mellitus without complications: Secondary | ICD-10-CM

## 2017-11-29 DIAGNOSIS — I4891 Unspecified atrial fibrillation: Secondary | ICD-10-CM

## 2017-11-29 LAB — RESPIRATORY PANEL BY PCR
ADENOVIRUS-RVPPCR: NOT DETECTED
BORDETELLA PERTUSSIS-RVPCR: NOT DETECTED
CHLAMYDOPHILA PNEUMONIAE-RVPPCR: NOT DETECTED
CORONAVIRUS NL63-RVPPCR: NOT DETECTED
Coronavirus 229E: NOT DETECTED
Coronavirus HKU1: NOT DETECTED
Coronavirus OC43: NOT DETECTED
INFLUENZA A-RVPPCR: NOT DETECTED
INFLUENZA B-RVPPCR: NOT DETECTED
MYCOPLASMA PNEUMONIAE-RVPPCR: NOT DETECTED
Metapneumovirus: NOT DETECTED
PARAINFLUENZA VIRUS 3-RVPPCR: NOT DETECTED
PARAINFLUENZA VIRUS 4-RVPPCR: NOT DETECTED
Parainfluenza Virus 1: NOT DETECTED
Parainfluenza Virus 2: NOT DETECTED
RESPIRATORY SYNCYTIAL VIRUS-RVPPCR: NOT DETECTED
Rhinovirus / Enterovirus: NOT DETECTED

## 2017-11-29 LAB — GRAM STAIN

## 2017-11-29 LAB — CBC
HCT: 38.3 % — ABNORMAL LOW (ref 39.0–52.0)
HEMOGLOBIN: 11.9 g/dL — AB (ref 13.0–17.0)
MCH: 30.8 pg (ref 26.0–34.0)
MCHC: 31.1 g/dL (ref 30.0–36.0)
MCV: 99.2 fL (ref 78.0–100.0)
PLATELETS: 141 10*3/uL — AB (ref 150–400)
RBC: 3.86 MIL/uL — AB (ref 4.22–5.81)
RDW: 11.9 % (ref 11.5–15.5)
WBC: 7.4 10*3/uL (ref 4.0–10.5)

## 2017-11-29 LAB — GLUCOSE, CAPILLARY
GLUCOSE-CAPILLARY: 253 mg/dL — AB (ref 70–99)
GLUCOSE-CAPILLARY: 280 mg/dL — AB (ref 70–99)
GLUCOSE-CAPILLARY: 260 mg/dL — AB (ref 70–99)
Glucose-Capillary: 275 mg/dL — ABNORMAL HIGH (ref 70–99)

## 2017-11-29 LAB — HEPARIN LEVEL (UNFRACTIONATED)
HEPARIN UNFRACTIONATED: 0.52 [IU]/mL (ref 0.30–0.70)
Heparin Unfractionated: 0.64 IU/mL (ref 0.30–0.70)

## 2017-11-29 LAB — URINE CULTURE

## 2017-11-29 LAB — TROPONIN I: Troponin I: <0.03 ng/mL (ref <0.03)

## 2017-11-29 MED ORDER — METOPROLOL SUCCINATE ER 100 MG PO TB24: 200.0000 mg | ORAL_TABLET | Freq: Every day | ORAL | Status: DC

## 2017-11-29 MED ORDER — APIXABAN 2.5 MG PO TABS
2.5000 mg | ORAL_TABLET | Freq: Two times a day (BID) | ORAL | Status: DC
  Administered 2017-11-29 – 2017-12-05 (×12): 2.5 mg via ORAL
  Filled 2017-11-29 (×12): qty 1

## 2017-11-29 MED ORDER — HEPARIN (PORCINE) IN NACL 100-0.45 UNIT/ML-% IJ SOLN: 1350.0000 [IU]/h | INTRAMUSCULAR | Status: AC

## 2017-11-29 MED ORDER — GI COCKTAIL ~~LOC~~
30.0000 mL | Freq: Once | ORAL | Status: AC
  Administered 2017-11-29: 30 mL via ORAL
  Filled 2017-11-29: qty 30

## 2017-11-29 MED ORDER — DILTIAZEM HCL-DEXTROSE 100-5 MG/100ML-% IV SOLN (PREMIX)
5.0000 mg/h | INTRAVENOUS | Status: DC
  Administered 2017-11-29: 5 mg/h via INTRAVENOUS
  Filled 2017-11-29 (×2): qty 100

## 2017-11-29 MED ORDER — IPRATROPIUM BROMIDE 0.02 % IN SOLN
0.5000 mg | Freq: Three times a day (TID) | RESPIRATORY_TRACT | Status: DC
  Administered 2017-11-29 – 2017-12-01 (×6): 0.5 mg via RESPIRATORY_TRACT
  Filled 2017-11-29 (×6): qty 2.5

## 2017-11-29 MED ORDER — METOPROLOL SUCCINATE ER 100 MG PO TB24
100.0000 mg | ORAL_TABLET | Freq: Every day | ORAL | Status: DC
  Administered 2017-11-30 – 2017-12-05 (×6): 100 mg via ORAL
  Filled 2017-11-29 (×6): qty 1

## 2017-11-29 MED ORDER — LEVALBUTEROL HCL 1.25 MG/0.5ML IN NEBU
1.2500 mg | INHALATION_SOLUTION | Freq: Three times a day (TID) | RESPIRATORY_TRACT | Status: DC
  Administered 2017-11-29 – 2017-12-01 (×6): 1.25 mg via RESPIRATORY_TRACT
  Filled 2017-11-29 (×6): qty 0.5

## 2017-11-29 MED ORDER — APIXABAN 2.5 MG PO TABS: 2.5000 mg | ORAL_TABLET | Freq: Two times a day (BID) | ORAL | Status: DC

## 2017-11-29 NOTE — Progress Notes (Signed)
Inpatient Diabetes Program Recommendations  AACE/ADA: New Consensus Statement on Inpatient Glycemic Control (2019)  Target Ranges:  Prepandial:   less than 140 mg/dL      Peak postprandial:   less than 180 mg/dL (1-2 hours)      Critically ill patients:  140 - 180 mg/dL   Results for Timothy Cordova, Timothy Cordova (MRN [id number]) as of 11/29/2017 10:05  Ref. Range 11/28/2017 17:05 11/28/2017 22:15 11/29/2017 07:59  Glucose-Capillary Latest Ref Range: 70 - 99 mg/dL 409203 (H) 811287 (H) 914253 (H)   Review of Glycemic Control  Diabetes history: DM2 Outpatient Diabetes medications: Glipizide 5 mg BID Current orders for Inpatient glycemic control: Novolog 0-15 units TID with meals, Novolog 0-5 units QHS; Solumedrol 60 mg Q8H  Inpatient Diabetes Program Recommendations: Insulin - Basal: If steroids are continued as ordered, please consider ordering Lantus 8 units Q24H (based on 80 kg x 0.1 units).  Thanks, Timothy PennerMarie Jim Lundin, RN, MSN, CDE Diabetes Coordinator Inpatient Diabetes Program [phone] (Team Pager from 8am to 5pm)

## 2017-11-29 NOTE — Evaluation (Signed)
Occupational Therapy Evaluation Patient Details Name: Timothy Cordova MRN: [id number] DOB: Jun 04, 1928 Today's Date: 11/29/2017    History of Present Illness Mr. Charney is an 84yo M who presented to the ED with SOB. Pt was admitted with sepsis dx. PMH includes HTN, diastolic CHF, CKD stage III, COPD.    Clinical Impression   Pt admitted with SOB and now experiencing medical complications that limit his OOB mobility greatly. Eval extremely limited d/t pt becoming tachy-cardic and RN requesting pt to remain in bed. Extensive discussion with pt re falls in the home and recommendations/education provided for fall risk reduction and call systems to expedite help when needed. OT will continue to f/u to update follow up recommendation ASAP.     Follow Up Recommendations  Other (comment)(unable to assess d/t limited eval)    Equipment Recommendations  None recommended by OT    Recommendations for Other Services PT consult     Precautions / Restrictions Precautions Precautions: Fall Restrictions Weight Bearing Restrictions: No      Mobility Bed Mobility                  Transfers                      Balance                                           ADL either performed or assessed with clinical judgement   ADL Overall ADL's : Needs assistance/impaired                                       General ADL Comments: eval extremely limited d/t pt becoming tachycardic and RN requesting pt not leave bed     Vision         Perception     Praxis      Pertinent Vitals/Pain Pain Assessment: No/denies pain     Hand Dominance Right   Extremity/Trunk Assessment Upper Extremity Assessment Upper Extremity Assessment: Overall WFL for tasks assessed   Lower Extremity Assessment Lower Extremity Assessment: Defer to PT evaluation   Cervical / Trunk Assessment Cervical / Trunk Assessment: Normal   Communication  Communication Communication: No difficulties   Cognition Arousal/Alertness: Awake/alert Behavior During Therapy: WFL for tasks assessed/performed Overall Cognitive Status: Within Functional Limits for tasks assessed                                     General Comments       Exercises     Shoulder Instructions      Home Living Family/patient expects to be discharged to:: Private residence Living Arrangements: Spouse/significant other Available Help at Discharge: Family;Available 24 hours/day(Pt cares for wife who has residual weakness from hx cx) Type of Home: House Home Access: Ramped entrance     Home Layout: Two level;Able to live on main level with bedroom/bathroom     Bathroom Shower/Tub: Producer, television/film/video: Handicapped height Bathroom Accessibility: Yes How Accessible: Accessible via walker Home Equipment: Walker - 2 wheels;Walker - 4 wheels;Cane - single point;Bedside commode;Shower seat;Grab bars - toilet;Grab bars - tub/shower          Prior Functioning/Environment  Level of Independence: Independent        Comments: Pt reports he frequently fell at home, especially in the yard        OT Problem List: Decreased activity tolerance;Cardiopulmonary status limiting activity      OT Treatment/Interventions: Self-care/ADL training;Therapeutic exercise;Energy conservation;DME and/or AE instruction;Therapeutic activities;Patient/family education;Balance training    OT Goals(Current goals can be found in the care plan section) Acute Rehab OT Goals Patient Stated Goal: "to go back home with my wife" OT Goal Formulation: With patient Time For Goal Achievement: 11/29/17 Potential to Achieve Goals: Good  OT Frequency: Min 2X/week   Barriers to D/C: Decreased caregiver support  pt's wife requires assistance and their son lives nearby       Co-evaluation              AM-PAC PT "6 Clicks" Daily Activity     Outcome Measure  Help from another person eating meals?: None Help from another person taking care of personal grooming?: None         6 Click Score: 8   End of Session Nurse Communication: Other (comment)(medical status)  Activity Tolerance: Treatment limited secondary to medical complications (Comment)(tachycardic) Patient left: in bed;with nursing/sitter in room  OT Visit Diagnosis: Muscle weakness (generalized) (M62.81)                Time: [id number] OT Time Calculation (min): 29 min Charges:  OT General Charges $OT Visit: 1 Visit OT Evaluation $OT Eval Low Complexity: 1 Low OT Treatments $Therapeutic Activity: 8-22 mins  Crissie ReeseSandra H Maelee Hoot OTR/L 11/29/2017, 12:12 PM

## 2017-11-29 NOTE — Progress Notes (Addendum)
Progress Note  Patient Name: Timothy Cordova Date of Encounter: 11/29/2017  Primary Cardiologist: Julien Nordmann, MD   Subjective   Pt with c/o GERD symptoms since eating [place] sausage with breakfast this AM. HR has been elevated in the 140's since this time. Denies chest pain.   Inpatient Medications    Scheduled Meds: . arformoterol  15 mcg Nebulization BID  . aspirin EC  81 mg Oral Daily  . budesonide (PULMICORT) nebulizer solution  0.5 mg Nebulization BID  . doxazosin  4 mg Oral QHS  . furosemide  20 mg Oral Daily  . guaiFENesin  600 mg Oral BID  . insulin aspart  0-15 Units Subcutaneous TID WC  . insulin aspart  0-5 Units Subcutaneous QHS  . ipratropium  0.5 mg Nebulization TID  . levalbuterol  1.25 mg Nebulization TID  . methylPREDNISolone (SOLU-MEDROL) injection  60 mg Intravenous Q8H  . metoprolol  100 mg Oral Daily  . pantoprazole  40 mg Oral Daily  . polyvinyl alcohol  1 drop Both Eyes BID  . rosuvastatin  40 mg Oral Daily   Continuous Infusions: . azithromycin Stopped (11/28/17 1627)  . cefTRIAXone (ROCEPHIN)  IV Stopped (11/28/17 1555)  . heparin 1,350 Units/hr (11/29/17 0540)   PRN Meds:   Vital Signs    Vitals:   11/29/17 0102 11/29/17 0500 11/29/17 0842 11/29/17 1016  BP: 113/77  115/69 (!) 157/87  Pulse: 89  (!) 109 (!) 143  Resp: (!) 24  19   Temp: 97.8 F (36.6 C)  (!) 97.5 F (36.4 C)   TempSrc:   Oral   SpO2: 100%  97%   Weight:  80.2 kg    Height:        Intake/Output Summary (Last 24 hours) at 11/29/2017 1250 Last data filed at 11/28/2017 2300 Gross per 24 hour  Intake -  Output 0 ml  Net 0 ml   Filed Weights   11/28/17 1300 11/28/17 2130 11/29/17 0500  Weight: 82.4 kg 80.2 kg 80.2 kg   Physical Exam   General: Well developed, well nourished, NAD Skin: Warm, dry, intact  Head: Normocephalic, atraumatic, clear, moist mucus membranes. Neck: Negative for carotid bruits. No JVD Lungs:Diminished in upper and lower lobe.  Breathing is unlabored.  + wheezing  Cardiovascular: Irregularly irregular with S1 S2. No murmurs, rubs, gallops, or LV heave appreciated. Abdomen: Soft, non-tender, non-distended with normoactive bowel sounds. No obvious abdominal masses. MSK: Strength and tone appear normal for age. 5/5 in all extremities Extremities: No edema. No clubbing or cyanosis. DP/PT pulses 2+ bilaterally Neuro: Alert and oriented. No focal deficits. No facial asymmetry. MAE spontaneously. Psych: Responds to questions appropriately with normal affect.    Labs    Chemistry Recent Labs  Lab 11/28/17 0748  NA 139  K 5.1  CL 102  CO2 23  GLUCOSE 254*  BUN 32*  CREATININE 1.89*  CALCIUM 9.7  GFRNONAA 30*  GFRAA 35*  ANIONGAP 14     Hematology Recent Labs  Lab 11/28/17 0748 11/29/17 0708  WBC 13.7* 7.4  RBC 4.25 3.86*  HGB 13.1 11.9*  HCT 42.1 38.3*  MCV 99.1 99.2  MCH 30.8 30.8  MCHC 31.1 31.1  RDW 11.9 11.9  PLT 174 141*    Cardiac Enzymes Recent Labs  Lab 11/28/17 1347 11/28/17 2052 11/29/17 0018  TROPONINI <0.03 <0.03 <0.03    Recent Labs  Lab 11/28/17 0757  TROPIPOC 0.01     BNP Recent Labs  Lab 11/28/17 0748  BNP 315.0*     DDimer No results for input(s): DDIMER in the last 168 hours.   Radiology    Dg Chest 2 View  Result Date: 11/28/2017 CLINICAL DATA:  Shortness of breath EXAM: CHEST - 2 VIEW COMPARISON:  09/17/2017 FINDINGS: Prior CABG. Heart is borderline in size. No confluent airspace opacities, effusions or edema. No acute bony abnormality. IMPRESSION: Prior CABG.  Borderline heart size.  No active disease. Electronically Signed   By: Charlett NoseKevin  Dover M.D.   On: 11/28/2017 08:40   Telemetry    11/29/17 Atrial flutter with HR 130-140's - Personally Reviewed  ECG     11/28/17 Atrial flutter- Personally Reviewed  Cardiac Studies   Echocardiogram 10/16/2016 Study Conclusions - Left ventricle: The cavity size was normal. Wall thickness was increased in a  pattern of mild LVH. Systolic function was normal. The estimated ejection fraction was in the range of 60% to 65%. Wall motion was normal; there were no regional wall motion abnormalities. Doppler parameters are consistent with abnormal left ventricular relaxation (grade 1 diastolic dysfunction). - Mitral valve: Calcified annulus. Mildly thickened leaflets . - Left atrium: The atrium was mildly dilated. - Pulmonary arteries: Systolic pressure was mildly increased. PA peak pressure: 42 mm Hg (S).  Impressions: - Normal LV systolic function; mild diastolic dysfunction; mild LVH; mild LAE; trace TR with mildly elevated pulmonary pressure.  Patient Profile     84 y.o. male with a history of CAD s/p CABG 2006, patent grafts in 2010 & 2015, diastolic CHF, CKD stage III, hypertension, severe COPD, long history of smoking, diabetes, chronic shortness of breath, left subclavian artery stent and mild dementia who presented to the hospital with complaints of shortness of breath and orthopnea.  He was found to be in atrial flutter which appears to be new.  Assessment & Plan    1. New onset atrial flutter: -Remains in atrial flutter with rates in the 130-140's since approximately 1030am per tele review  -TSH, 0.453 -Continue IV heparin for now>>then Eliquis 2.5mg  PO twice daily given age and renal function  -Continue Toprol-XL 100mg  for rate control>>will add IV diltiazem and monitor BP closely    -Pt has complaints of GERD symptoms since breakfast>>will give GI cocktail and reassess>>could be exacerbating rate -Trop, negative x2 -Denies chest pain -CHA2DS2VASc =6 (age, CAD, HTN, DM2, CHF)  2. Acute on chronic diastolic CHF: -Last echocardiogram 10/2016 with LVH, LVEF of 60-65% with G1DD -BNP moderately elevated on admission without overt s/s of fluid volume overload -Treated with two doses of IV Lasix  -Weight, 80.2kg today>>82.4kg on admission  -I&O, no volume  documented -Monitor I&O  3. COPD exacerbation: -CXR without significant changes -Leukocytosis on admission with temp of 100.4 -UA negative -Per primary team -Continue current regimen   4. CAD: -Hx of CABG 2006>>patent grafts per cath in 2010 and 2015 -Continue BB, ASA, statin -Denies chest pain or associated symptoms -Trop negative   5. HLD: -LDL, 68 in 09/2017 -Continue statin therapy   6. DM2: -Last HbA1c, 7.7 in 07/2017  7. CKD stage III: -Creatinine today, 1.89 -Baseline appears to be in the 1.4-1.7 range -Avoid nephrotoxic medications   Signed, Georgie ChardJill McDaniel NP-C [facility] Pager: [phone] 11/29/2017, 12:50 PM     For questions or updates, please contact   Please consult [email or web address] for contact info under Cardiology/STEMI.  Attending Note:   The patient was seen and examined.  Agree with assessment and plan as noted above.  Changes made  to the above note as needed.  Patient seen and independently examined with  Georgie Chard, AP .   We discussed all aspects of the encounter. I agree with the assessment and plan as stated above.  1.  Rapid atrial fibrillation: Patient presents with atrial fibrillation.  His rate remains fast.  We are starting diltiazem drip.  I suspect that this is due to his COPD exacerbation.  Will DC heparin and start Eliquis 2.5  BID ( age 5, Cr = 1.89)     I have spent a total of 40 minutes with patient reviewing hospital  notes , telemetry, EKGs, labs and examining patient as well as establishing an assessment and plan that was discussed with the patient. > 50% of time was spent in direct patient care.    Vesta Mixer, Montez Hageman., MD, [facility] 11/29/2017, 2:49 PM [address]  [address] Office [phone] Pager [phone]

## 2017-11-29 NOTE — Progress Notes (Addendum)
PROGRESS NOTE  Timothy Cordova ZOX:[id number] DOB: 07-27-1928 DOA: 11/28/2017 PCP: Hannah Beat, MD  HPI/Recap of past 24 hours:  Wheezing, dry cough, remain in aflutter/rvr Wife at bedside  Assessment/Plan: Principal Problem:   Sepsis (HCC) Active Problems:   DM (diabetes mellitus), type 2 with renal complications (HCC)   Essential hypertension   CAD, AUTOLOGOUS BYPASS GRAFT   DIASTOLIC HEART FAILURE, CHRONIC   BPH (benign prostatic hyperplasia)   COPD with acute exacerbation (HCC)  COPD exacerbation/bronchitis/early pna/sepsis/acute respiratory failure: -Patient presents with temperature 100.4 F, tachycardia, and tachypnea.  WBC was elevated at 13.7 -sputum culture in process, blood culture no growth, respiratory viral panel negative -initial cxr no acute infiltrate,  -troponin negative -urine strep pneumo antigen negative -initial procalcitonin 0.42, will trend procalcitonin  -consider repeat cxr in am -continue nebs  (change  From duoneb to xopenex/atrovent due to tachycardia), iv steroids, mucinex, rocephin/zithro for now -wean oxygen as tolerated  New onset aflutter/RVR -heart rate not well controlled by betablocker alone, will leave to cardiology to decide on cardizem drip -he is started on heparin drip on admission, now transitioned to apixaban per cardiology -echo pending, troponin negative -appreciate cardiology input  AKI on CKDIII -bun/cr on admission is 32/1.89 (baseline cr 1.5) -ua unremarkable -repeat bmp, renal dosing meds  chronic diastolic chf -he does not appear significant volume overloaded, will continue home oral dose of lasix -close monitor volume status -cardiology following  CAD s/p CABG -denies chest pain, troponin negative -continue on betablocker, asa, statin -cardiology following  nonsinuslin dependent dm2 a1c in 07/2017 was 7.7% Home meds glipizide held since admission Expect elevated blood glucose while on iv steroids, he  is on meal coverage and ssi   BPH - Continue doxazosin  Code Status: full  Family Communication: patient and wife at bedside  Disposition Plan:  he desire to be able to leave hospital saturday morning to attend a meeting in [place] on Saturday afternoon, he understand this will depends on his heart and lung condition   Consultants:  cardiology  Procedures:  none  Antibiotics:  As above   Objective: BP 113/77 (BP Location: Left Arm)   Pulse 89   Temp 97.8 F (36.6 C)   Resp (!) 24   Ht 5\' 6"  (1.676 m)   Wt 80.2 kg   SpO2 100%   BMI 28.52 kg/m   Intake/Output Summary (Last 24 hours) at 11/29/2017 8119 Last data filed at 11/28/2017 2300 Gross per 24 hour  Intake -  Output 0 ml  Net 0 ml   Filed Weights   11/28/17 1300 11/28/17 2130 11/29/17 0500  Weight: 82.4 kg 80.2 kg 80.2 kg    Exam: Patient is examined daily including today on 11/29/2017, exams remain the same as of yesterday except that has changed    General:  Appear weak, intermittent dry cough  Cardiovascular: tachycardia  Respiratory: diffuse bilateral wheezing  Abdomen: Soft/ND/NT, positive BS  Musculoskeletal: No Edema, right lower extremity with ted hose on  Neuro: alert, oriented   Data Reviewed: Basic Metabolic Panel: Recent Labs  Lab 11/28/17 0748  NA 139  K 5.1  CL 102  CO2 23  GLUCOSE 254*  BUN 32*  CREATININE 1.89*  CALCIUM 9.7  MG 1.8   Liver Function Tests: No results for input(s): AST, ALT, ALKPHOS, BILITOT, PROT, ALBUMIN in the last 168 hours. No results for input(s): LIPASE, AMYLASE in the last 168 hours. No results for input(s): AMMONIA in the last 168 hours. CBC:  Recent Labs  Lab 11/28/17 0748 11/29/17 0708  WBC 13.7* 7.4  HGB 13.1 11.9*  HCT 42.1 38.3*  MCV 99.1 99.2  PLT 174 141*   Cardiac Enzymes:   Recent Labs  Lab 11/28/17 1347 11/28/17 2052 11/29/17 0018  TROPONINI <0.03 <0.03 <0.03   BNP (last 3 results) Recent Labs     07/30/17 1644 11/28/17 0748  BNP 127.4* 315.0*    ProBNP (last 3 results) No results for input(s): PROBNP in the last 8760 hours.  CBG: Recent Labs  Lab 11/28/17 1705 11/28/17 2215 11/29/17 0759  GLUCAP 203* 287* 253*    Recent Results (from the past 240 hour(s))  Urine culture     Status: Abnormal   Collection Time: 11/28/17  8:58 AM  Result Value Ref Range Status   Specimen Description URINE, RANDOM  Final   Special Requests NONE  Final   Culture (A)  Final    <10,000 COLONIES/mL INSIGNIFICANT GROWTH Performed at [facility], [address] [place], [place] [address]    Report Status 11/29/2017 FINAL  Final  Gram stain     Status: None   Collection Time: 11/28/17  2:18 PM  Result Value Ref Range Status   Specimen Description TRACHEAL SITE  Final   Special Requests NONE  Final   Gram Stain   Final    ABUNDANT WBC PRESENT, PREDOMINANTLY PMN MODERATE GRAM POSITIVE COCCI MODERATE GRAM VARIABLE ROD RARE YEAST ABUNDANT SQUAMOUS EPITHELIAL CELLS PRESENT Performed at [facility], [address] [place], [place] [address]    Report Status 11/29/2017 FINAL  Final  Respiratory Panel by PCR     Status: None   Collection Time: 11/28/17  2:19 PM  Result Value Ref Range Status   Adenovirus NOT DETECTED NOT DETECTED Final   Coronavirus 229E NOT DETECTED NOT DETECTED Final   Coronavirus HKU1 NOT DETECTED NOT DETECTED Final   Coronavirus NL63 NOT DETECTED NOT DETECTED Final   Coronavirus OC43 NOT DETECTED NOT DETECTED Final   Metapneumovirus NOT DETECTED NOT DETECTED Final   Rhinovirus / Enterovirus NOT DETECTED NOT DETECTED Final   Influenza A NOT DETECTED NOT DETECTED Final   Influenza B NOT DETECTED NOT DETECTED Final   Parainfluenza Virus 1 NOT DETECTED NOT DETECTED Final   Parainfluenza Virus 2 NOT DETECTED NOT DETECTED Final   Parainfluenza Virus 3 NOT DETECTED NOT DETECTED Final   Parainfluenza Virus 4 NOT DETECTED NOT DETECTED Final   Respiratory  Syncytial Virus NOT DETECTED NOT DETECTED Final   Bordetella pertussis NOT DETECTED NOT DETECTED Final   Chlamydophila pneumoniae NOT DETECTED NOT DETECTED Final   Mycoplasma pneumoniae NOT DETECTED NOT DETECTED Final    Comment: Performed at [facility], [address] [place], [place] [address]     Studies: Dg Chest 2 View  Result Date: 11/28/2017 CLINICAL DATA:  Shortness of breath EXAM: CHEST - 2 VIEW COMPARISON:  09/17/2017 FINDINGS: Prior CABG. Heart is borderline in size. No confluent airspace opacities, effusions or edema. No acute bony abnormality. IMPRESSION: Prior CABG.  Borderline heart size.  No active disease. Electronically Signed   By: Charlett NoseKevin  Dover M.D.   On: 11/28/2017 08:40    Scheduled Meds: . arformoterol  15 mcg Nebulization BID  . aspirin EC  81 mg Oral Daily  . budesonide (PULMICORT) nebulizer solution  0.5 mg Nebulization BID  . doxazosin  4 mg Oral QHS  . furosemide  20 mg Oral Daily  . guaiFENesin  600  mg Oral BID  . insulin aspart  0-15 Units Subcutaneous TID WC  . insulin aspart  0-5 Units Subcutaneous QHS  . ipratropium-albuterol  3 mL Nebulization QID  . methylPREDNISolone (SOLU-MEDROL) injection  60 mg Intravenous Q8H  . metoprolol  100 mg Oral Daily  . pantoprazole  40 mg Oral Daily  . polyvinyl alcohol  1 drop Both Eyes BID  . rosuvastatin  40 mg Oral Daily    Continuous Infusions: . azithromycin Stopped (11/28/17 1627)  . cefTRIAXone (ROCEPHIN)  IV Stopped (11/28/17 1555)  . heparin 1,350 Units/hr (11/29/17 0540)     Time spent: 35 mins I have personally reviewed and interpreted on  11/29/2017 daily labs, tele strips, imagings as discussed above under date review session and assessment and plans.  I reviewed all nursing notes, pharmacy notes, consultant notes,  vitals, pertinent old records  I have discussed plan of care as described above with RN , patient and family on 11/29/2017   Albertine Grates MD, PhD  [facility] Pager  [phone]. If 7PM-7AM, please contact night-coverage at [email or web address], password [facility] 11/29/2017, 8:33 AM  LOS: 1 day

## 2017-11-29 NOTE — Progress Notes (Signed)
ANTICOAGULATION CONSULT NOTE   Pharmacy Consult for heparin Indication: atrial fibrillation    Patient Measurements: Height: 5\' 6"  (167.6 cm) Weight: 176 lb 11.2 oz (80.2 kg) IBW/kg (Calculated) : 63.8 Heparin Dosing Weight: 80.5kg  Vital Signs: Temp: 97.8 F (36.6 C) (08/22 0102) Temp Source: Oral (08/21 2159) BP: 113/77 (08/22 0102) Pulse Rate: 89 (08/22 0102)  Labs: Recent Labs    11/28/17 0748 11/28/17 1347 11/28/17 2052 11/28/17 2212 11/29/17 0018 11/29/17 0708  HGB 13.1  --   --   --   --  11.9*  HCT 42.1  --   --   --   --  38.3*  PLT 174  --   --   --   --  141*  HEPARINUNFRC  --   --   --  0.25*  --  0.52  CREATININE 1.89*  --   --   --   --   --   TROPONINI  --  <0.03 <0.03  --  <0.03  --     Estimated Creatinine Clearance: 27.4 mL/min (A) (by C-G formula based on SCr of 1.89 mg/dL (H)).     Medications:  Infusions:  . azithromycin Stopped (11/28/17 1627)  . cefTRIAXone (ROCEPHIN)  IV Stopped (11/28/17 1555)  . heparin 1,350 Units/hr (11/29/17 0540)    Assessment: 89 yom presented to the ED with SOB found to be in new aflutter. To start IV heparin for anticoagulation with plans to transition to a DOAC. Baseline CBC is WNL and he is not on anticoagulation PTA.  Current heparin level 0.52 units/ml   Goal of Therapy:  Heparin level 0.3-0.7 units/ml Monitor platelets by anticoagulation protocol: Yes   Plan:  Continue Heparin gtt at 1350 units/hr Heparin level in 8 hours Daily heparin level and CBC F/u plans for DOAC  Omolola Mittman A. Jeanella CrazePierce, PharmD, BCPS Clinical Pharmacist [place] Pager: [phone] Please utilize Amion for appropriate phone number to reach the unit pharmacist [facility]    11/29/2017,8:09 AM

## 2017-11-29 NOTE — Progress Notes (Signed)
11/29/2017 Central monitor called patient hear rate sustaining in the 140's. Dr Roda ShuttersXU was made aware [facility] Con[facility] Spruell RN.

## 2017-11-29 NOTE — Progress Notes (Signed)
PT Cancellation Note  Patient Details Name: Timothy Cordova MRN: [id number] DOB: [birth year]   Cancelled Treatment:    Reason Eval/Treat Not Completed: (P) Medical issues which prohibited therapy Pt with continued increased HR. RN requests PT follow back tomorrow for Evaluation.   [name] B. [name] PT, DPT Acute Rehabilitation  [phone] Pager [phone]  [name] 11/29/2017, 2:36 PM

## 2017-11-30 ENCOUNTER — Inpatient Hospital Stay (HOSPITAL_COMMUNITY): Payer: Medicare PPO

## 2017-11-30 DIAGNOSIS — I361 Nonrheumatic tricuspid (valve) insufficiency: Secondary | ICD-10-CM

## 2017-11-30 LAB — COMPREHENSIVE METABOLIC PANEL
ALBUMIN: 3 g/dL — AB (ref 3.5–5.0)
ALT: 15 U/L (ref 0–44)
ANION GAP: 8 (ref 5–15)
AST: 26 U/L (ref 15–41)
Alkaline Phosphatase: 52 U/L (ref 38–126)
BUN: 63 mg/dL — ABNORMAL HIGH (ref 8–23)
CO2: 28 mmol/L (ref 22–32)
Calcium: 8.8 mg/dL — ABNORMAL LOW (ref 8.9–10.3)
Chloride: 101 mmol/L (ref 98–111)
Creatinine, Ser: 1.88 mg/dL — ABNORMAL HIGH (ref 0.61–1.24)
GFR calc Af Amer: 35 mL/min — ABNORMAL LOW (ref >60)
GFR calc non Af Amer: 31 mL/min — ABNORMAL LOW (ref >60)
GLUCOSE: 244 mg/dL — AB (ref 70–99)
POTASSIUM: 4.6 mmol/L (ref 3.5–5.1)
SODIUM: 137 mmol/L (ref 135–145)
TOTAL PROTEIN: 5.9 g/dL — AB (ref 6.5–8.1)
Total Bilirubin: 0.4 mg/dL (ref 0.3–1.2)

## 2017-11-30 LAB — CBC
HCT: 35.8 % — ABNORMAL LOW (ref 39.0–52.0)
Hemoglobin: 11.1 g/dL — ABNORMAL LOW (ref 13.0–17.0)
MCH: 30.7 pg (ref 26.0–34.0)
MCHC: 31 g/dL (ref 30.0–36.0)
MCV: 98.9 fL (ref 78.0–100.0)
PLATELETS: 161 10*3/uL (ref 150–400)
RBC: 3.62 MIL/uL — ABNORMAL LOW (ref 4.22–5.81)
RDW: 11.9 % (ref 11.5–15.5)
WBC: 8.2 10*3/uL (ref 4.0–10.5)

## 2017-11-30 LAB — MRSA PCR SCREENING: MRSA BY PCR: NEGATIVE

## 2017-11-30 LAB — GLUCOSE, CAPILLARY
GLUCOSE-CAPILLARY: 402 mg/dL — AB (ref 70–99)
GLUCOSE-CAPILLARY: 406 mg/dL — AB (ref 70–99)
Glucose-Capillary: 343 mg/dL — ABNORMAL HIGH (ref 70–99)
Glucose-Capillary: 371 mg/dL — ABNORMAL HIGH (ref 70–99)
Glucose-Capillary: 222 mg/dL — ABNORMAL HIGH (ref 70–99)

## 2017-11-30 LAB — ECHOCARDIOGRAM COMPLETE
HEIGHTINCHES: 66 in
WEIGHTICAEL: 2892.44 [oz_av]

## 2017-11-30 LAB — LEGIONELLA PNEUMOPHILA SEROGP 1 UR AG: L. PNEUMOPHILA SEROGP 1 UR AG: NEGATIVE

## 2017-11-30 LAB — MAGNESIUM: Magnesium: 2.3 mg/dL (ref 1.7–2.4)

## 2017-11-30 LAB — GLUCOSE, RANDOM: Glucose, Bld: 237 mg/dL — ABNORMAL HIGH (ref 70–99)

## 2017-11-30 LAB — PROCALCITONIN: Procalcitonin: 0.43 ng/mL

## 2017-11-30 MED ORDER — BIOTENE DRY MOUTH MT LIQD: 15.0000 mL | OROMUCOSAL | Status: DC | PRN

## 2017-11-30 MED ORDER — INSULIN ASPART 100 UNIT/ML ~~LOC~~ SOLN
3.0000 [IU] | Freq: Three times a day (TID) | SUBCUTANEOUS | Status: DC
  Administered 2017-11-30 – 2017-12-05 (×14): 3 [IU] via SUBCUTANEOUS

## 2017-11-30 MED ORDER — INSULIN GLARGINE 100 UNIT/ML ~~LOC~~ SOLN
5.0000 [IU] | Freq: Two times a day (BID) | SUBCUTANEOUS | Status: DC
  Administered 2017-11-30 – 2017-12-01 (×4): 5 [IU] via SUBCUTANEOUS
  Filled 2017-11-30 (×5): qty 0.05

## 2017-11-30 NOTE — Progress Notes (Signed)
PT Cancellation Note  Patient Details Name: Timothy Cordova MRN: [id number] DOB: 08-17-1928   Cancelled Treatment:    Reason Eval/Treat Not Completed: (P) Patient at procedure or test/unavailable Pt off floor for procedure. PT will follow back as able.  Rhone Ozaki B. [name] PT, DPT Acute Rehabilitation  [phone] Pager [phone]     Timothy Cordova 11/30/2017, 8:54 AM

## 2017-11-30 NOTE — Progress Notes (Signed)
PROGRESS NOTE  Timothy Cordova JXB:[id number] DOB: 1928-11-19 DOA: 11/28/2017 PCP: Hannah Beatopland, Spencer, MD  HPI/Recap of past 24 hours:   remain in aflutter, Heart rate improving Continue to have audible Wheezing, able to talk in full sentences , Less dry cough,  C/o dry mouth No edema today No fever Wife at bedside  Assessment/Plan: Principal Problem:   Sepsis (HCC) Active Problems:   DM (diabetes mellitus), type 2 with renal complications (HCC)   Essential hypertension   CAD, AUTOLOGOUS BYPASS GRAFT   DIASTOLIC HEART FAILURE, CHRONIC   BPH (benign prostatic hyperplasia)   COPD with acute exacerbation (HCC)   Atrial fibrillation and flutter (HCC)  COPD exacerbation/bronchitis/early pna/sepsis/acute respiratory failure: -Patient presents with temperature 100.4 F, tachycardia, and tachypnea.  WBC was elevated at 13.7 -sputum culture in process, blood culture no growth, respiratory viral panel negative -initial cxr no acute infiltrate, repeat cxr with trace pleural fluids, chronic cardiomegaly, copd. -troponin negative -urine strep pneumo antigen negative -initial procalcitonin 0.42, will trend procalcitonin  --continue nebs, iv steroids, mucinex, rocephin/zithro , remain diffuse bilateral wheezing -wean oxygen as tolerated (baseline not oxygen dependent)  New onset aflutter/RVR -heart rate not well controlled by betablocker alone, initially required cardizem drip, heart rate improving, now off cardizem drip -he is started on heparin drip on admission, now transitioned to apixaban per cardiology -echo with preserved LVEF, troponin negative -appreciate cardiology input  AKI on CKDIII -bun/cr on admission is 32/1.89 (baseline cr 1.5) -ua unremarkable -repeat bmp, renal dosing meds  chronic diastolic chf -he does not appear significant volume overloaded, will continue home oral dose of lasix -close monitor volume status -cardiology following  CAD s/p CABG -denies  chest pain, troponin negative -continue on betablocker, asa, statin -cardiology following  nonsinuslin dependent dm2 a1c in 07/2017 was 7.7% Home meds glipizide held since admission Expect elevated blood glucose while on iv steroids, add lantus bid, and meal coverage   BPH - Continue doxazosin  Code Status: full  Family Communication: patient and wife at bedside  Disposition Plan: home with home health in 2-3 days, he remain wheezing, I do not think he can be discharged tomorrow though  he desire to be able to leave hospital saturday morning to attend a meeting in [place] on Saturday afternoon, he understand this will depends on his heart and lung condition   Consultants:  cardiology  Procedures:  none  Antibiotics:  As above   Objective: BP (!) 120/52 (BP Location: Left Arm) Comment: RN notified  Pulse 70   Temp 98.2 F (36.8 C) (Oral)   Resp 17   Ht 5\' 6"  (1.676 m)   Wt 82 kg   SpO2 98%   BMI 29.18 kg/m   Intake/Output Summary (Last 24 hours) at 11/30/2017 0734 Last data filed at 11/30/2017 [id number] Gross per 24 hour  Intake -  Output 175 ml  Net -175 ml   Filed Weights   11/28/17 2130 11/29/17 0500 11/30/17 0600  Weight: 80.2 kg 80.2 kg 82 kg    Exam: Patient is examined daily including today on 11/30/2017, exams remain the same as of yesterday except that has changed    General:  Appear weak, intermittent dry cough  Cardiovascular: less tachycardia  Respiratory: diffuse bilateral wheezing  Abdomen: Soft/ND/NT, positive BS  Musculoskeletal: No Edema, right lower extremity with ted hose on  Neuro: alert, oriented   Data Reviewed: Basic Metabolic Panel: Recent Labs  Lab 11/28/17 0748 11/30/17 0255  NA 139 137  K 5.1 4.6  CL 102 101  CO2 23 28  GLUCOSE 254* 244*  BUN 32* 63*  CREATININE 1.89* 1.88*  CALCIUM 9.7 8.8*  MG 1.8 2.3   Liver Function Tests: Recent Labs  Lab 11/30/17 0255  AST 26  ALT 15  ALKPHOS 52  BILITOT 0.4    PROT 5.9*  ALBUMIN 3.0*   No results for input(s): LIPASE, AMYLASE in the last 168 hours. No results for input(s): AMMONIA in the last 168 hours. CBC: Recent Labs  Lab 11/28/17 0748 11/29/17 0708 11/30/17 0255  WBC 13.7* 7.4 8.2  HGB 13.1 11.9* 11.1*  HCT 42.1 38.3* 35.8*  MCV 99.1 99.2 98.9  PLT 174 141* 161   Cardiac Enzymes:   Recent Labs  Lab 11/28/17 1347 11/28/17 2052 11/29/17 0018  TROPONINI <0.03 <0.03 <0.03   BNP (last 3 results) Recent Labs    07/30/17 1644 11/28/17 0748  BNP 127.4* 315.0*    ProBNP (last 3 results) No results for input(s): PROBNP in the last 8760 hours.  CBG: Recent Labs  Lab 11/28/17 2215 11/29/17 0759 11/29/17 1212 11/29/17 1718 11/29/17 2102  GLUCAP 287* 253* 280* 275* 260*    Recent Results (from the past 240 hour(s))  Urine culture     Status: Abnormal   Collection Time: 11/28/17  8:58 AM  Result Value Ref Range Status   Specimen Description URINE, RANDOM  Final   Special Requests NONE  Final   Culture (A)  Final    <10,000 COLONIES/mL INSIGNIFICANT GROWTH Performed at [facility], [address] [place], [place] [address]    Report Status 11/29/2017 FINAL  Final  Gram stain     Status: None   Collection Time: 11/28/17  2:18 PM  Result Value Ref Range Status   Specimen Description TRACHEAL SITE  Final   Special Requests NONE  Final   Gram Stain   Final    ABUNDANT WBC PRESENT, PREDOMINANTLY PMN MODERATE GRAM POSITIVE COCCI MODERATE GRAM VARIABLE ROD RARE YEAST ABUNDANT SQUAMOUS EPITHELIAL CELLS PRESENT Performed at [facility], [address], [place], [place] [address]    Report Status 11/29/2017 FINAL  Final  Respiratory Panel by PCR     Status: None   Collection Time: 11/28/17  2:19 PM  Result Value Ref Range Status   Adenovirus NOT DETECTED NOT DETECTED Final   Coronavirus 229E NOT DETECTED NOT DETECTED Final   Coronavirus HKU1 NOT DETECTED NOT DETECTED Final   Coronavirus NL63 NOT  DETECTED NOT DETECTED Final   Coronavirus OC43 NOT DETECTED NOT DETECTED Final   Metapneumovirus NOT DETECTED NOT DETECTED Final   Rhinovirus / Enterovirus NOT DETECTED NOT DETECTED Final   Influenza A NOT DETECTED NOT DETECTED Final   Influenza B NOT DETECTED NOT DETECTED Final   Parainfluenza Virus 1 NOT DETECTED NOT DETECTED Final   Parainfluenza Virus 2 NOT DETECTED NOT DETECTED Final   Parainfluenza Virus 3 NOT DETECTED NOT DETECTED Final   Parainfluenza Virus 4 NOT DETECTED NOT DETECTED Final   Respiratory Syncytial Virus NOT DETECTED NOT DETECTED Final   Bordetella pertussis NOT DETECTED NOT DETECTED Final   Chlamydophila pneumoniae NOT DETECTED NOT DETECTED Final   Mycoplasma pneumoniae NOT DETECTED NOT DETECTED Final    Comment: Performed at [facility], [address], [place], [place] [address]  Blood culture (routine x 2)     Status: None (Preliminary result)   Collection Time: 11/28/17  2:40 PM  Result Value Ref Range Status  Specimen Description BLOOD RIGHT ANTECUBITAL  Final   Special Requests   Final    BOTTLES DRAWN AEROBIC AND ANAEROBIC Blood Culture adequate volume   Culture   Final    NO GROWTH < 24 HOURS Performed at [facility], [address] [place], [place] [address]    Report Status PENDING  Incomplete  Blood culture (routine x 2)     Status: None (Preliminary result)   Collection Time: 11/28/17  4:03 PM  Result Value Ref Range Status   Specimen Description BLOOD LEFT ANTECUBITAL  Final   Special Requests   Final    BOTTLES DRAWN AEROBIC AND ANAEROBIC Blood Culture adequate volume   Culture   Final    NO GROWTH < 24 HOURS Performed at [facility], [address] [place], [place] [address]    Report Status PENDING  Incomplete  MRSA PCR Screening     Status: None   Collection Time: 11/29/17  9:14 PM  Result Value Ref Range Status   MRSA by PCR NEGATIVE NEGATIVE Final    Comment:        The GeneXpert MRSA Assay (FDA approved  for NASAL specimens only), is one component of a comprehensive MRSA colonization surveillance program. It is not intended to diagnose MRSA infection nor to guide or monitor treatment for MRSA infections. Performed at [facility], [address], [place], [place] [address]      Studies: No results found.  Scheduled Meds: . apixaban  2.5 mg Oral BID  . arformoterol  15 mcg Nebulization BID  . aspirin EC  81 mg Oral Daily  . budesonide (PULMICORT) nebulizer solution  0.5 mg Nebulization BID  . doxazosin  4 mg Oral QHS  . furosemide  20 mg Oral Daily  . guaiFENesin  600 mg Oral BID  . insulin aspart  0-15 Units Subcutaneous TID WC  . insulin aspart  0-5 Units Subcutaneous QHS  . ipratropium  0.5 mg Nebulization TID  . levalbuterol  1.25 mg Nebulization TID  . methylPREDNISolone (SOLU-MEDROL) injection  60 mg Intravenous Q8H  . metoprolol  100 mg Oral Daily  . pantoprazole  40 mg Oral Daily  . polyvinyl alcohol  1 drop Both Eyes BID  . rosuvastatin  40 mg Oral Daily    Continuous Infusions: . azithromycin 500 mg (11/29/17 1529)  . cefTRIAXone (ROCEPHIN)  IV Stopped (11/29/17 1524)  . diltiazem (CARDIZEM) infusion 5 mg/hr (11/29/17 1639)     Time spent: 35 mins I have personally reviewed and interpreted on  11/30/2017 daily labs, tele strips, imagings as discussed above under date review session and assessment and plans.  I reviewed all nursing notes, pharmacy notes, consultant notes,  vitals, pertinent old records  I have discussed plan of care as described above with RN , patient and family on 11/30/2017   Albertine Grates MD, PhD  [facility] Pager [phone]. If 7PM-7AM, please contact night-coverage at [email or web address], password [facility] 11/30/2017, 7:34 AM  LOS: 2 days

## 2017-11-30 NOTE — Discharge Instructions (Signed)

## 2017-11-30 NOTE — Progress Notes (Signed)
Moved patient from room 2W08 to 2W12 due to facilities needing to fix the room family informed.

## 2017-11-30 NOTE — Evaluation (Signed)
Physical Therapy Evaluation Patient Details Name: Timothy Cordova MRN: [id number] DOB: 1928/10/01 Today's Date: 11/30/2017   History of Present Illness  [name] is an 84yo M who presented to the ED with SOB. Pt was admitted with sepsis dx. PMH includes HTN, diastolic CHF, CKD stage III, COPD.   Clinical Impression  PTA pt independent in community level ambulation, and independent in iADLs. Pt takes care of wife recovering from cancer treatments. Pt currently limited in safe mobility by oxygen desaturation (see General Comments) and decreased strength and endurance. Pt requires mod I for bed mobility, supervision for transfers and min guard for ambulation of 20 feet with RW. PT recommends HHPT level rehab at d/c to improve strength and endurance. PT will continue to follow acutely.       Follow Up Recommendations Home health PT;Supervision - Intermittent    Equipment Recommendations  None recommended by PT       Precautions / Restrictions Precautions Precautions: Fall Precaution Comments: while working outside falls in [employer] hole Restrictions Weight Bearing Restrictions: No      Mobility  Bed Mobility Overal bed mobility: Modified Independent             General bed mobility comments: increased time   Transfers Overall transfer level: Needs assistance Equipment used: None Transfers: Sit to/from Stand Sit to Stand: Supervision         General transfer comment: vc for hand placement for power up  Ambulation/Gait Ambulation/Gait assistance: Min guard Gait Distance (Feet): 25 Feet Assistive device: Rolling walker (2 wheeled) Gait Pattern/deviations: Step-through pattern;Decreased stride length;Shuffle Gait velocity: slowed Gait velocity interpretation: 1.31 - 2.62 ft/sec, indicative of limited community ambulator General Gait Details: min guard for safety, vc for proximity to RW and upright posture         Balance Overall balance assessment: Mild  deficits observed, not formally tested                                           Pertinent Vitals/Pain Pain Assessment: No/denies pain    Home Living Family/patient expects to be discharged to:: Private residence Living Arrangements: Spouse/significant other Available Help at Discharge: Family;Available 24 hours/day(Pt cares for wife who has residual weakness from hx cx) Type of Home: House Home Access: Ramped entrance     Home Layout: Two level;Able to live on main level with bedroom/bathroom Home Equipment: Dan HumphreysWalker - 2 wheels;Walker - 4 wheels;Cane - single point;Bedside commode;Shower seat;Grab bars - toilet;Grab bars - tub/shower      Prior Function Level of Independence: Independent         Comments: Pt reports he frequently fell at home, especially in the yard     Hand Dominance   Dominant Hand: Right    Extremity/Trunk Assessment   Upper Extremity Assessment Upper Extremity Assessment: Defer to OT evaluation    Lower Extremity Assessment Lower Extremity Assessment: Generalized weakness    Cervical / Trunk Assessment Cervical / Trunk Assessment: Normal  Communication   Communication: No difficulties  Cognition Arousal/Alertness: Awake/alert Behavior During Therapy: WFL for tasks assessed/performed Overall Cognitive Status: Within Functional Limits for tasks assessed                                        General Comments General comments (skin  integrity, edema, etc.): SaO2 on 2L O2 100%O2, ambualted without supplemental O2 and SaO2 dropped to 85%O2, quickly rebounded to 95%O2 with replacement of 2 L supplemental O2, with activity max HR 110 bpm     Assessment/Plan    PT Assessment Patient needs continued PT services  PT Problem List Decreased strength;Cardiopulmonary status limiting activity;Decreased activity tolerance;Decreased mobility       PT Treatment Interventions DME instruction;Gait training;Functional  mobility training;Therapeutic activities;Therapeutic exercise;Balance training;Cognitive remediation;Patient/family education    PT Goals (Current goals can be found in the Care Plan section)  Acute Rehab PT Goals Patient Stated Goal: "to go back home with my wife" PT Goal Formulation: With patient/family Time For Goal Achievement: 12/14/17 Potential to Achieve Goals: Good    Frequency Min 3X/week    AM-PAC PT "6 Clicks" Daily Activity  Outcome Measure Difficulty turning over in bed (including adjusting bedclothes, sheets and blankets)?: None Difficulty moving from lying on back to sitting on the side of the bed? : A Little Difficulty sitting down on and standing up from a chair with arms (e.g., wheelchair, bedside commode, etc,.)?: A Little Help needed moving to and from a bed to chair (including a wheelchair)?: A Little Help needed walking in hospital room?: A Little Help needed climbing 3-5 steps with a railing? : A Lot 6 Click Score: 18    End of Session Equipment Utilized During Treatment: Gait belt;Oxygen Activity Tolerance: Patient tolerated treatment well Patient left: in chair;with call bell/phone within reach;with chair alarm set;with family/visitor present Nurse Communication: Mobility status PT Visit Diagnosis: Other abnormalities of gait and mobility (R26.89);History of falling (Z91.81);Muscle weakness (generalized) (M62.81);Difficulty in walking, not elsewhere classified (R26.2)    Time: 7253-6644 PT Time Calculation (min) (ACUTE ONLY): 28 min   Charges:   PT Evaluation $PT Eval Moderate Complexity: 1 Mod PT Treatments $Gait Training: 8-22 mins        Brigido Mera B. Beverely Risen PT, DPT Acute Rehabilitation  [phone] Pager [phone]    Elon Alas Fleet 11/30/2017, 2:57 PM

## 2017-11-30 NOTE — Progress Notes (Signed)
Stopped cardizem drip HR 69 Ok with Dr. Roda ShuttersXu to stop drip at this point.

## 2017-11-30 NOTE — Progress Notes (Signed)
Inpatient Diabetes Program Recommendations  AACE/ADA: New Consensus Statement on Inpatient Glycemic Control (2019)  Target Ranges:  Prepandial:   less than 140 mg/dL      Peak postprandial:   less than 180 mg/dL (1-2 hours)      Critically ill patients:  140 - 180 mg/dL  Results for Timothy Cordova, Timothy Cordova (MRN [id number]) as of 11/30/2017 09:22  Ref. Range 11/29/2017 07:59 11/29/2017 12:12 11/29/2017 17:18 11/29/2017 21:02 11/30/2017 08:38  Glucose-Capillary Latest Ref Range: 70 - 99 mg/dL 409253 (H) 811280 (H) 914275 (H) 260 (H) 343 (H)    Review of Glycemic Control  Diabetes history: DM2 Outpatient Diabetes medications: Glipizide 5 mg BID Current orders for Inpatient glycemic control: Novolog 0-15 units TID with meals, Novolog 0-5 units QHS; Solumedrol 60 mg Q8H  Inpatient Diabetes Program Recommendations: Insulin - Basal: If steroids are continued as ordered, please consider ordering Lantus 12 units Q24H (based on 80 kg x 0.15 units). Insulin-Meal Coverage: If steroids are continued, please consider ordering Novolog 3 units TID with meals for meal coverage if patient eats at least 50% of meals.  Thanks, Orlando PennerMarie Danie Diehl, RN, MSN, CDE Diabetes Coordinator Inpatient Diabetes Program [phone] (Team Pager from 8am to 5pm)

## 2017-11-30 NOTE — Progress Notes (Signed)
  Echocardiogram 2D Echocardiogram has been performed.  Kamarius Buckbee T Jnae Thomaston 11/30/2017, 9:50 AM

## 2017-11-30 NOTE — Progress Notes (Signed)
Occupational Therapy Treatment Patient Details Name: Timothy Cordova MRN: [id number] DOB: April 06, 1929 Today's Date: 11/30/2017    History of present illness Mr. Sheerin is an 84yo M who presented to the ED with SOB. Pt was admitted with sepsis dx. PMH includes HTN, diastolic CHF, CKD stage III, COPD.    OT comments  Pt making great progress toward OT goals, with increased participation this session 2/2 medical status. Pt found in bed without [place] on and SpO2 at 88%. Pt edu re O2 levels and importance of not "self-weening". Pt agreeable to don [facility] prior to functional mobility to bathroom at (S) level. D/c recommendations updated with no OT f/u recommended and pt to d/c home.   Follow Up Recommendations  No OT follow up    Equipment Recommendations  None recommended by OT    Recommendations for Other Services PT consult    Precautions / Restrictions Precautions Precautions: Fall Restrictions Weight Bearing Restrictions: No       Mobility Bed Mobility Overal bed mobility: Modified Independent             General bed mobility comments: increased time   Transfers Overall transfer level: Needs assistance Equipment used: None Transfers: Sit to/from Stand;Stand Pivot Transfers Sit to Stand: Supervision Stand pivot transfers: Supervision       General transfer comment: vc for activity pacing    Balance Overall balance assessment: Mild deficits observed, not formally tested                                         ADL either performed or assessed with clinical judgement   ADL Overall ADL's : Needs assistance/impaired Eating/Feeding: Modified independent   Grooming: Supervision/safety;Standing   Upper Body Bathing: Set up               Toilet Transfer: Supervision/safety;Grab bars Toilet Transfer Details (indicate cue type and reason): vc for safety Toileting- Clothing Manipulation and Hygiene: Supervision/safety                Vision   Vision Assessment?: No apparent visual deficits Additional Comments: glasses at baseline   Perception     Praxis      Cognition Arousal/Alertness: Awake/alert Behavior During Therapy: WFL for tasks assessed/performed Overall Cognitive Status: Within Functional Limits for tasks assessed                                          Exercises     Shoulder Instructions       General Comments      Pertinent Vitals/ Pain       Pain Assessment: No/denies pain  Home Living                                          Prior Functioning/Environment              Frequency  Min 2X/week        Progress Toward Goals  OT Goals(current goals can now be found in the care plan section)  Progress towards OT goals: Progressing toward goals  Acute Rehab OT Goals Patient Stated Goal: "to go back home with my wife" OT Goal Formulation: With patient  Time For Goal Achievement: 11/29/17 Potential to Achieve Goals: Good  Plan Discharge plan remains appropriate    Co-evaluation                 AM-PAC PT "6 Clicks" Daily Activity     Outcome Measure   Help from another person eating meals?: None Help from another person taking care of personal grooming?: None Help from another person toileting, which includes using toliet, bedpan, or urinal?: A Little Help from another person bathing (including washing, rinsing, drying)?: A Little Help from another person to put on and taking off regular upper body clothing?: None Help from another person to put on and taking off regular lower body clothing?: A Little 6 Click Score: 21    End of Session Equipment Utilized During Treatment: Gait belt;Oxygen  OT Visit Diagnosis: Muscle weakness (generalized) (M62.81)   Activity Tolerance Patient tolerated treatment well   Patient Left in bed;with nursing/sitter in room   Nurse Communication Mobility status        Time: [id number] OT  Time Calculation (min): 15 min  Charges: OT General Charges $OT Visit: 1 Visit OT Treatments $Self Care/Home Management : 8-22 mins   Crissie ReeseSandra H Darey Hershberger OTR/L 11/30/2017, 8:38 AM

## 2017-11-30 NOTE — Progress Notes (Signed)
Progress Note  Patient Name: Timothy Cordova Date of Encounter: 11/30/2017  Primary Cardiologist: Julien Nordmannimothy [name], MD   Subjective   Pt with c/o GERD symptoms since eating [place] sausage with breakfast this AM. HR has been elevated in the 140's since this time. Denies chest pain.   Feeling better   Inpatient Medications    Scheduled Meds: . apixaban  2.5 mg Oral BID  . arformoterol  15 mcg Nebulization BID  . aspirin EC  81 mg Oral Daily  . budesonide (PULMICORT) nebulizer solution  0.5 mg Nebulization BID  . doxazosin  4 mg Oral QHS  . furosemide  20 mg Oral Daily  . guaiFENesin  600 mg Oral BID  . insulin aspart  0-15 Units Subcutaneous TID WC  . insulin aspart  0-5 Units Subcutaneous QHS  . insulin aspart  3 Units Subcutaneous TID WC  . insulin glargine  5 Units Subcutaneous BID  . ipratropium  0.5 mg Nebulization TID  . levalbuterol  1.25 mg Nebulization TID  . methylPREDNISolone (SOLU-MEDROL) injection  60 mg Intravenous Q8H  . metoprolol  100 mg Oral Daily  . pantoprazole  40 mg Oral Daily  . polyvinyl alcohol  1 drop Both Eyes BID  . rosuvastatin  40 mg Oral Daily   Continuous Infusions: . azithromycin 500 mg (11/30/17 1506)  . cefTRIAXone (ROCEPHIN)  IV 2 g (11/30/17 1423)  . diltiazem (CARDIZEM) infusion 5 mg/hr (11/29/17 1639)   PRN Meds:   Vital Signs    Vitals:   11/30/17 0837 11/30/17 1118 11/30/17 1308 11/30/17 1719  BP: 107/82 (!) 105/43  118/66  Pulse: 69   72  Resp:      Temp:    (!) 97.5 F (36.4 C)  TempSrc:    Oral  SpO2: 100%  100% 99%  Weight:      Height:        Intake/Output Summary (Last 24 hours) at 11/30/2017 1836 Last data filed at 11/30/2017 0651 Gross per 24 hour  Intake -  Output 175 ml  Net -175 ml   Filed Weights   11/28/17 2130 11/29/17 0500 11/30/17 0600  Weight: 80.2 kg 80.2 kg 82 kg   Physical Exam   Physical Exam: Blood pressure 118/66, pulse 72, temperature (!) 97.5 F (36.4 C), temperature source  Oral, resp. rate 17, height 5\' 6"  (1.676 m), weight 82 kg, SpO2 99 %.  GEN:   Elderly man.  Mild - mod respiratory distress  HEENT: Normal NECK: No JVD; No carotid bruits LYMPHATICS: No lymphadenopathy CARDIAC: Irreg. Irreg.   HR is well controlled  RESPIRATORY:  Clear to auscultation without rales, wheezing or rhonchi  ABDOMEN: Soft, non-tender, non-distended MUSCULOSKELETAL:  No edema; No deformity  SKIN: Warm and dry NEUROLOGIC:  Alert and oriented x 3   Labs    Chemistry Recent Labs  Lab 11/28/17 0748 11/30/17 0255  NA 139 137  K 5.1 4.6  CL 102 101  CO2 23 28  GLUCOSE 254* 237*  244*  BUN 32* 63*  CREATININE 1.89* 1.88*  CALCIUM 9.7 8.8*  PROT  --  5.9*  ALBUMIN  --  3.0*  AST  --  26  ALT  --  15  ALKPHOS  --  52  BILITOT  --  0.4  GFRNONAA 30* 31*  GFRAA 35* 35*  ANIONGAP 14 8     Hematology Recent Labs  Lab 11/28/17 0748 11/29/17 0708 11/30/17 0255  WBC 13.7* 7.4 8.2  RBC 4.25 3.86* 3.62*  HGB 13.1 11.9* 11.1*  HCT 42.1 38.3* 35.8*  MCV 99.1 99.2 98.9  MCH 30.8 30.8 30.7  MCHC 31.1 31.1 31.0  RDW 11.9 11.9 11.9  PLT 174 141* 161    Cardiac Enzymes Recent Labs  Lab 11/28/17 1347 11/28/17 2052 11/29/17 0018  TROPONINI <0.03 <0.03 <0.03    Recent Labs  Lab 11/28/17 0757  TROPIPOC 0.01     BNP Recent Labs  Lab 11/28/17 0748  BNP 315.0*     DDimer No results for input(s): DDIMER in the last 168 hours.   Radiology    Dg Chest 2 View  Result Date: 11/30/2017 CLINICAL DATA:  Pneumonia EXAM: CHEST - 2 VIEW COMPARISON:  Two days ago FINDINGS: Chronic cardiomegaly. Status post CABG. Chronic hyperinflation. Trace pleural effusions in the posterior costophrenic sulci. No Kerley lines for edema. No air bronchogram. Subclavian stent on the left. No acute osseous finding IMPRESSION: 1. Trace pleural fluid. 2. Chronic cardiomegaly. 3. COPD. Electronically Signed   By: Marnee Spring M.D.   On: 11/30/2017 09:21   Telemetry    11/29/17  Atrial flutter with HR 130-140's - Personally Reviewed  ECG     11/28/17 Atrial flutter- Personally Reviewed  Cardiac Studies   Echocardiogram 10/16/2016 Study Conclusions - Left ventricle: The cavity size was normal. Wall thickness was increased in a pattern of mild LVH. Systolic function was normal. The estimated ejection fraction was in the range of 60% to 65%. Wall motion was normal; there were no regional wall motion abnormalities. Doppler parameters are consistent with abnormal left ventricular relaxation (grade 1 diastolic dysfunction). - Mitral valve: Calcified annulus. Mildly thickened leaflets . - Left atrium: The atrium was mildly dilated. - Pulmonary arteries: Systolic pressure was mildly increased. PA peak pressure: 42 mm Hg (S).  Impressions: - Normal LV systolic function; mild diastolic dysfunction; mild LVH; mild LAE; trace TR with mildly elevated pulmonary pressure.  Patient Profile     84 y.o. male with a history of CAD s/p CABG 2006, patent grafts in 2010 & 2015, diastolic CHF, CKD stage III, hypertension, severe COPD, long history of smoking, diabetes, chronic shortness of breath, left subclavian artery stent and mild dementia who presented to the hospital with complaints of shortness of breath and orthopnea.  He was found to be in atrial flutter which appears to be new.  Assessment & Plan    1. New onset atrial flutter:  HR is now well controlled Continue metoprolol .  dilt drip is off   2. Acute on chronic diastolic CHF: Continue lasix     3. COPD exacerbation: This seems to be his primary MD    4. CAD: -Hx of CABG 2006>>patent grafts per cath in 2010 and 2015 -Continue BB, ASA, statin -Denies chest pain or associated symptoms -Trop negative   5. HLD: -LDL, 68 in 09/2017 -Continue statin therapy   6. DM2: -Last HbA1c, 7.7 in 07/2017  7. CKD stage III: -Creatinine today, 1.89 -Baseline appears to be in the 1.4-1.7  range -Avoid nephrotoxic medications     Kristeen Miss, MD  11/30/2017 6:39 PM    [facility] [address],  [address] [place], [place]  [address] Pager [phone] Phone: [phone]; Fax: [phone]

## 2017-12-01 DIAGNOSIS — I5032 Chronic diastolic (congestive) heart failure: Secondary | ICD-10-CM

## 2017-12-01 DIAGNOSIS — I10 Essential (primary) hypertension: Secondary | ICD-10-CM

## 2017-12-01 DIAGNOSIS — E1121 Type 2 diabetes mellitus with diabetic nephropathy: Secondary | ICD-10-CM

## 2017-12-01 DIAGNOSIS — I483 Typical atrial flutter: Secondary | ICD-10-CM

## 2017-12-01 DIAGNOSIS — J9621 Acute and chronic respiratory failure with hypoxia: Secondary | ICD-10-CM

## 2017-12-01 DIAGNOSIS — I25718 Atherosclerosis of autologous vein coronary artery bypass graft(s) with other forms of angina pectoris: Secondary | ICD-10-CM

## 2017-12-01 LAB — BASIC METABOLIC PANEL
Anion gap: 9 (ref 5–15)
BUN: 71 mg/dL — ABNORMAL HIGH (ref 8–23)
CALCIUM: 8.9 mg/dL (ref 8.9–10.3)
CO2: 27 mmol/L (ref 22–32)
CREATININE: 1.74 mg/dL — AB (ref 0.61–1.24)
Chloride: 103 mmol/L (ref 98–111)
GFR, EST AFRICAN AMERICAN: 39 mL/min — AB (ref >60)
GFR, EST NON AFRICAN AMERICAN: 34 mL/min — AB (ref >60)
Glucose, Bld: 264 mg/dL — ABNORMAL HIGH (ref 70–99)
Potassium: 4.3 mmol/L (ref 3.5–5.1)
SODIUM: 139 mmol/L (ref 135–145)

## 2017-12-01 LAB — CBC
HCT: 36.2 % — ABNORMAL LOW (ref 39.0–52.0)
Hemoglobin: 11.4 g/dL — ABNORMAL LOW (ref 13.0–17.0)
MCH: 31.1 pg (ref 26.0–34.0)
MCHC: 31.5 g/dL (ref 30.0–36.0)
MCV: 98.9 fL (ref 78.0–100.0)
PLATELETS: 169 10*3/uL (ref 150–400)
RBC: 3.66 MIL/uL — ABNORMAL LOW (ref 4.22–5.81)
RDW: 11.7 % (ref 11.5–15.5)
WBC: 8.4 10*3/uL (ref 4.0–10.5)

## 2017-12-01 LAB — GLUCOSE, CAPILLARY
GLUCOSE-CAPILLARY: 244 mg/dL — AB (ref 70–99)
GLUCOSE-CAPILLARY: 285 mg/dL — AB (ref 70–99)
GLUCOSE-CAPILLARY: 253 mg/dL — AB (ref 70–99)
Glucose-Capillary: 239 mg/dL — ABNORMAL HIGH (ref 70–99)

## 2017-12-01 LAB — PROCALCITONIN: PROCALCITONIN: 0.25 ng/mL

## 2017-12-01 LAB — HEMOGLOBIN A1C
Hgb A1c MFr Bld: 7.8 % — ABNORMAL HIGH (ref 4.8–5.6)
MEAN PLASMA GLUCOSE: 177.16 mg/dL

## 2017-12-01 MED ORDER — METHYLPREDNISOLONE SODIUM SUCC 125 MG IJ SOLR
60.0000 mg | Freq: Two times a day (BID) | INTRAMUSCULAR | Status: DC
  Administered 2017-12-01 – 2017-12-02 (×2): 60 mg via INTRAVENOUS
  Filled 2017-12-01 (×2): qty 2

## 2017-12-01 MED ORDER — LEVALBUTEROL HCL 1.25 MG/0.5ML IN NEBU
1.2500 mg | INHALATION_SOLUTION | Freq: Two times a day (BID) | RESPIRATORY_TRACT | Status: DC
  Administered 2017-12-01 – 2017-12-02 (×2): 1.25 mg via RESPIRATORY_TRACT
  Filled 2017-12-01 (×2): qty 0.5

## 2017-12-01 MED ORDER — AMOXICILLIN-POT CLAVULANATE 500-125 MG PO TABS
1.0000 | ORAL_TABLET | Freq: Two times a day (BID) | ORAL | Status: AC
  Administered 2017-12-01 – 2017-12-04 (×8): 500 mg via ORAL
  Filled 2017-12-01 (×8): qty 1

## 2017-12-01 MED ORDER — IPRATROPIUM BROMIDE 0.02 % IN SOLN
0.5000 mg | Freq: Two times a day (BID) | RESPIRATORY_TRACT | Status: DC
  Administered 2017-12-01 – 2017-12-02 (×2): 0.5 mg via RESPIRATORY_TRACT
  Filled 2017-12-01 (×2): qty 2.5

## 2017-12-01 MED ORDER — AZITHROMYCIN 250 MG PO TABS
500.0000 mg | ORAL_TABLET | Freq: Every day | ORAL | Status: AC
  Administered 2017-12-01 – 2017-12-04 (×4): 500 mg via ORAL
  Filled 2017-12-01 (×4): qty 2

## 2017-12-01 MED ORDER — SENNOSIDES-DOCUSATE SODIUM 8.6-50 MG PO TABS
1.0000 | ORAL_TABLET | Freq: Two times a day (BID) | ORAL | Status: DC
  Administered 2017-12-01 – 2017-12-03 (×5): 1 via ORAL
  Filled 2017-12-01 (×6): qty 1

## 2017-12-01 NOTE — Progress Notes (Signed)
Progress Note  Patient Name: Timothy Cordova Date of Encounter: 12/01/2017  Primary Cardiologist: Julien Nordmannimothy [name], MD   Subjective    Reports he feels better, similar to his baseline at home, but he still wearing oxygen and has really not ambulated.   Inpatient Medications    Scheduled Meds: . apixaban  2.5 mg Oral BID  . arformoterol  15 mcg Nebulization BID  . aspirin EC  81 mg Oral Daily  . budesonide (PULMICORT) nebulizer solution  0.5 mg Nebulization BID  . doxazosin  4 mg Oral QHS  . furosemide  20 mg Oral Daily  . guaiFENesin  600 mg Oral BID  . insulin aspart  0-15 Units Subcutaneous TID WC  . insulin aspart  0-5 Units Subcutaneous QHS  . insulin aspart  3 Units Subcutaneous TID WC  . insulin glargine  5 Units Subcutaneous BID  . ipratropium  0.5 mg Nebulization BID  . levalbuterol  1.25 mg Nebulization BID  . methylPREDNISolone (SOLU-MEDROL) injection  60 mg Intravenous Q8H  . metoprolol  100 mg Oral Daily  . pantoprazole  40 mg Oral Daily  . polyvinyl alcohol  1 drop Both Eyes BID  . rosuvastatin  40 mg Oral Daily   Continuous Infusions: . azithromycin 500 mg (11/30/17 1506)  . cefTRIAXone (ROCEPHIN)  IV 2 g (11/30/17 1423)  . diltiazem (CARDIZEM) infusion 5 mg/hr (11/29/17 1639)   PRN Meds: antiseptic oral rinse   Vital Signs    Vitals:   12/01/17 0341 12/01/17 0739 12/01/17 0740 12/01/17 0800  BP:    136/62  Pulse:    96  Resp:    (!) 22  Temp:    97.6 F (36.4 C)  TempSrc:    Oral  SpO2:  96% 99% 96%  Weight: 83.2 kg     Height:       No intake or output data in the 24 hours ending 12/01/17 1156 Filed Weights   11/29/17 0500 11/30/17 0600 12/01/17 0341  Weight: 80.2 kg 82 kg 83.2 kg    Telemetry    Atrial flutter with variable AV block, ventricular rate around 100- Personally Reviewed  ECG    11/28/2017 shows atrial flutter with variable AV block, right bundle branch block and left anterior fascicular block- Personally  Reviewed  Physical Exam  Comfortable sitting up eating lunch GEN: No acute distress.   Neck: No JVD Cardiac:  Irregular, widely split S2, no murmurs, rubs, or gallops.  Respiratory: Clear to auscultation bilaterally. GI: Soft, nontender, non-distended  MS: No edema; No deformity. Neuro:  Nonfocal  Psych: Normal affect   Labs    Chemistry Recent Labs  Lab 11/28/17 0748 11/30/17 0255 12/01/17 0259  NA 139 137 139  K 5.1 4.6 4.3  CL 102 101 103  CO2 23 28 27   GLUCOSE 254* 237*  244* 264*  BUN 32* 63* 71*  CREATININE 1.89* 1.88* 1.74*  CALCIUM 9.7 8.8* 8.9  PROT  --  5.9*  --   ALBUMIN  --  3.0*  --   AST  --  26  --   ALT  --  15  --   ALKPHOS  --  52  --   BILITOT  --  0.4  --   GFRNONAA 30* 31* 34*  GFRAA 35* 35* 39*  ANIONGAP 14 8 9      Hematology Recent Labs  Lab 11/29/17 0708 11/30/17 0255 12/01/17 0259  WBC 7.4 8.2 8.4  RBC 3.86* 3.62* 3.66*  HGB 11.9* 11.1* 11.4*  HCT 38.3* 35.8* 36.2*  MCV 99.2 98.9 98.9  MCH 30.8 30.7 31.1  MCHC 31.1 31.0 31.5  RDW 11.9 11.9 11.7  PLT 141* 161 169    Cardiac Enzymes Recent Labs  Lab 11/28/17 1347 11/28/17 2052 11/29/17 0018  TROPONINI <0.03 <0.03 <0.03    Recent Labs  Lab 11/28/17 0757  TROPIPOC 0.01     BNP Recent Labs  Lab 11/28/17 0748  BNP 315.0*     DDimer No results for input(s): DDIMER in the last 168 hours.   Radiology    Dg Chest 2 View  Result Date: 11/30/2017 CLINICAL DATA:  Pneumonia EXAM: CHEST - 2 VIEW COMPARISON:  Two days ago FINDINGS: Chronic cardiomegaly. Status post CABG. Chronic hyperinflation. Trace pleural effusions in the posterior costophrenic sulci. No Kerley lines for edema. No air bronchogram. Subclavian stent on the left. No acute osseous finding IMPRESSION: 1. Trace pleural fluid. 2. Chronic cardiomegaly. 3. COPD. Electronically Signed   By: Marnee Spring M.D.   On: 11/30/2017 09:21    Cardiac Studies   Echo November 30, 2017  - Left ventricle: The cavity  size was normal. There was mild   concentric hypertrophy. Systolic function was normal. The   estimated ejection fraction was in the range of 60% to 65%. Wall   motion was normal; there were no regional wall motion   abnormalities. - Aortic valve: There was trivial regurgitation. - Right atrium: The atrium was mildly dilated. - Pulmonary arteries: Systolic pressure was mildly increased. PA   peak pressure: 38 mm Hg (S).  Patient Profile     84 y.o. male with history of CAD (CABG 2006, patent graft by cath 2010 in 2015), chronic diastolic heart failure, severe COPD, essential hypertension, CKD stage III, diabetes mellitus, PAD with previous left subclavian artery stent, mild dementia, presenting with new onset atrial flutter with rapid ventricular response.  Left ventricular systolic function remains normal by echo.  No complaint of angina.  Assessment & Plan    1.  Atrial flutter: Rate control is adequate at rest, but I worry that he will develop tachycardia with minimal activity.  To walk in the hallway with assistance so that we can record ventricular rate.  If ventricular rate increases excessively physical activity, I will try to encourage him to stay for an inpatient cardioversion early next week.  This would have to be preceded by TEE since he has only just started anticoagulation. 2.  CAD status post CABG: Denies angina pectoris at rest or with activity. 3.  Acute on chronic respiratory failure with hypoxia/acute exacerbation of COPD: He has self documented exertional hypoxia at home for at least a year before this admission.  He will require home oxygen. 4. CKD3: Anticoagulant dose adjusted for age and renal function.     For questions or updates, please contact [facility] Please consult [email or web address] for contact info under Cardiology/STEMI.      Signed, Thurmon Fair, MD  12/01/2017, 11:56 AM

## 2017-12-01 NOTE — Progress Notes (Signed)
SATURATION QUALIFICATIONS: (This note is used to comply with regulatory documentation for home oxygen)  Patient Saturations on Room Air at Rest =82 %  Patient Saturations on Room Air while Ambulating =78%  Patient Saturations on 2 Liters of oxygen while Ambulating = 2%  Please briefly explain why patient needs home oxygen: Sat decrease when pt moving without 02

## 2017-12-01 NOTE — Progress Notes (Signed)
PROGRESS NOTE  Timothy Cordova ZOX:[id number] DOB: Feb 16, 1929 DOA: 11/28/2017 PCP: Hannah Beatopland, [place], MD  HPI/Recap of past 24 hours:   remain in aflutter, Heart rate controlled at rest Less Wheezing, no cough, looks better today Only minimal pitting edema, left > right No fever Denies pain C/o being constipated   Assessment/Plan: Principal Problem:   Sepsis (HCC) Active Problems:   DM (diabetes mellitus), type 2 with renal complications (HCC)   Essential hypertension   CAD, AUTOLOGOUS BYPASS GRAFT   DIASTOLIC HEART FAILURE, CHRONIC   BPH (benign prostatic hyperplasia)   COPD with acute exacerbation (HCC)   Atrial fibrillation and flutter (HCC)   Typical atrial flutter (HCC)  COPD exacerbation/bronchitis/early pna/sepsis/acute respiratory failure: -Patient presents with temperature 100.4 F, tachycardia, and tachypnea.  WBC was elevated at 13.7 --initial cxr no acute infiltrate, repeat cxr with trace pleural fluids, chronic cardiomegaly, copd. -troponin negative -urine strep pneumo and legionella  antigen negative, mrsa screening negative, sputum culture in process, blood culture no growth, respiratory viral panel negative -initial procalcitonin 0.42, procalcitonin trending down -improving, decrease steroids, change abx to augmentin/oral zithro, continue nebs/mucinex -wean oxygen as tolerated (baseline not oxygen dependent), but likely will need home o2  New onset aflutter/RVR -heart rate not well controlled by betablocker alone initially,  initially required cardizem drip, heart rate improving, now off cardizem drip -he is started on heparin drip on admission, now transitioned to apixaban per cardiology -echo with preserved LVEF, troponin negative - may need cardioversion, will follow cardiology recommendation, -appreciate cardiology input  chronic diastolic chf -he does not appear significant volume overloaded, will continue home oral dose of lasix -close monitor  volume status -cardiology following  CAD s/p CABG -denies chest pain, troponin negative -continue on betablocker, asa, statin -cardiology following  AKI on CKDIII -cr on admission is 1.89 (baseline cr 1.5) -ua unremarkable -cr today is 1.7, repeat bmp, renal dosing meds  nonsinuslin dependent dm2 a1c in 07/2017 was 7.7%, repeat a1c 7.8 Home meds glipizide held since admission Expect elevated blood glucose while on iv steroids, add lantus bid, and meal coverage Wean steroids, continue adjust insulin   BPH - Continue doxazosin  Constipation: stool softener started   Code Status: full  Family Communication: patient   Disposition Plan: home with home health/home o2  in 2-3 days   Consultants:  cardiology  Procedures:  none  Antibiotics:  As above   Objective: BP 136/62 (BP Location: Left Arm)   Pulse 96   Temp 97.6 F (36.4 C) (Oral)   Resp (!) 22   Ht 5\' 6"  (1.676 m)   Wt 83.2 kg   SpO2 96%   BMI 29.61 kg/m  No intake or output data in the 24 hours ending 12/01/17 1333 Filed Weights   11/29/17 0500 11/30/17 0600 12/01/17 0341  Weight: 80.2 kg 82 kg 83.2 kg    Exam: Patient is examined daily including today on 12/01/2017, exams remain the same as of yesterday except that has changed    General:  Better, NAD, sitting up in chair  Cardiovascular: less tachycardia  Respiratory: less  bilateral wheezing  Abdomen: Soft/ND/NT, positive BS  Musculoskeletal: No Edema in right leg, mild edema left leg  Neuro: alert, oriented   Data Reviewed: Basic Metabolic Panel: Recent Labs  Lab 11/28/17 0748 11/30/17 0255 12/01/17 0259  NA 139 137 139  K 5.1 4.6 4.3  CL 102 101 103  CO2 23 28 27   GLUCOSE 254* 237*  244* 264*  BUN 32*  63* 71*  CREATININE 1.89* 1.88* 1.74*  CALCIUM 9.7 8.8* 8.9  MG 1.8 2.3  --    Liver Function Tests: Recent Labs  Lab 11/30/17 0255  AST 26  ALT 15  ALKPHOS 52  BILITOT 0.4  PROT 5.9*  ALBUMIN 3.0*   No results  for input(s): LIPASE, AMYLASE in the last 168 hours. No results for input(s): AMMONIA in the last 168 hours. CBC: Recent Labs  Lab 11/28/17 0748 11/29/17 0708 11/30/17 0255 12/01/17 0259  WBC 13.7* 7.4 8.2 8.4  HGB 13.1 11.9* 11.1* 11.4*  HCT 42.1 38.3* 35.8* 36.2*  MCV 99.1 99.2 98.9 98.9  PLT 174 141* 161 169   Cardiac Enzymes:   Recent Labs  Lab 11/28/17 1347 11/28/17 2052 11/29/17 0018  TROPONINI <0.03 <0.03 <0.03   BNP (last 3 results) Recent Labs    07/30/17 1644 11/28/17 0748  BNP 127.4* 315.0*    ProBNP (last 3 results) No results for input(s): PROBNP in the last 8760 hours.  CBG: Recent Labs  Lab 11/30/17 1315 11/30/17 1719 11/30/17 2203 12/01/17 0800 12/01/17 1137  GLUCAP 406* 371* 222* 239* 244*    Recent Results (from the past 240 hour(s))  Urine culture     Status: Abnormal   Collection Time: 11/28/17  8:58 AM  Result Value Ref Range Status   Specimen Description URINE, RANDOM  Final   Special Requests NONE  Final   Culture (A)  Final    <10,000 COLONIES/mL INSIGNIFICANT GROWTH Performed at [facility], [address], [place], [place] [address]    Report Status 11/29/2017 FINAL  Final  Gram stain     Status: None   Collection Time: 11/28/17  2:18 PM  Result Value Ref Range Status   Specimen Description TRACHEAL SITE  Final   Special Requests NONE  Final   Gram Stain   Final    ABUNDANT WBC PRESENT, PREDOMINANTLY PMN MODERATE GRAM POSITIVE COCCI MODERATE GRAM VARIABLE ROD RARE YEAST ABUNDANT SQUAMOUS EPITHELIAL CELLS PRESENT Performed at [facility], [address] [place], [place] [address]    Report Status 11/29/2017 FINAL  Final  Respiratory Panel by PCR     Status: None   Collection Time: 11/28/17  2:19 PM  Result Value Ref Range Status   Adenovirus NOT DETECTED NOT DETECTED Final   Coronavirus 229E NOT DETECTED NOT DETECTED Final   Coronavirus HKU1 NOT DETECTED NOT DETECTED Final   Coronavirus NL63 NOT  DETECTED NOT DETECTED Final   Coronavirus OC43 NOT DETECTED NOT DETECTED Final   Metapneumovirus NOT DETECTED NOT DETECTED Final   Rhinovirus / Enterovirus NOT DETECTED NOT DETECTED Final   Influenza A NOT DETECTED NOT DETECTED Final   Influenza B NOT DETECTED NOT DETECTED Final   Parainfluenza Virus 1 NOT DETECTED NOT DETECTED Final   Parainfluenza Virus 2 NOT DETECTED NOT DETECTED Final   Parainfluenza Virus 3 NOT DETECTED NOT DETECTED Final   Parainfluenza Virus 4 NOT DETECTED NOT DETECTED Final   Respiratory Syncytial Virus NOT DETECTED NOT DETECTED Final   Bordetella pertussis NOT DETECTED NOT DETECTED Final   Chlamydophila pneumoniae NOT DETECTED NOT DETECTED Final   Mycoplasma pneumoniae NOT DETECTED NOT DETECTED Final    Comment: Performed at [facility], [address], [place], [place] [address]  Blood culture (routine x 2)     Status: None (Preliminary result)   Collection Time: 11/28/17  2:40 PM  Result Value Ref Range Status   Specimen Description  BLOOD RIGHT ANTECUBITAL  Final   Special Requests   Final    BOTTLES DRAWN AEROBIC AND ANAEROBIC Blood Culture adequate volume   Culture   Final    NO GROWTH 3 DAYS Performed at [facility], [address] [place], [place] [address]    Report Status PENDING  Incomplete  Blood culture (routine x 2)     Status: None (Preliminary result)   Collection Time: 11/28/17  4:03 PM  Result Value Ref Range Status   Specimen Description BLOOD LEFT ANTECUBITAL  Final   Special Requests   Final    BOTTLES DRAWN AEROBIC AND ANAEROBIC Blood Culture adequate volume   Culture   Final    NO GROWTH 3 DAYS Performed at [facility], [address], [place], [place] [address]    Report Status PENDING  Incomplete  MRSA PCR Screening     Status: None   Collection Time: 11/29/17  9:14 PM  Result Value Ref Range Status   MRSA by PCR NEGATIVE NEGATIVE Final    Comment:        The GeneXpert MRSA Assay (FDA approved for NASAL  specimens only), is one component of a comprehensive MRSA colonization surveillance program. It is not intended to diagnose MRSA infection nor to guide or monitor treatment for MRSA infections. Performed at [facility], [address], [place], [place] [address]      Studies: No results found.  Scheduled Meds: . amoxicillin-clavulanate  1 tablet Oral BID  . apixaban  2.5 mg Oral BID  . arformoterol  15 mcg Nebulization BID  . aspirin EC  81 mg Oral Daily  . azithromycin  500 mg Oral Daily  . budesonide (PULMICORT) nebulizer solution  0.5 mg Nebulization BID  . doxazosin  4 mg Oral QHS  . furosemide  20 mg Oral Daily  . guaiFENesin  600 mg Oral BID  . insulin aspart  0-15 Units Subcutaneous TID WC  . insulin aspart  0-5 Units Subcutaneous QHS  . insulin aspart  3 Units Subcutaneous TID WC  . insulin glargine  5 Units Subcutaneous BID  . ipratropium  0.5 mg Nebulization BID  . levalbuterol  1.25 mg Nebulization BID  . methylPREDNISolone (SOLU-MEDROL) injection  60 mg Intravenous Q12H  . metoprolol  100 mg Oral Daily  . pantoprazole  40 mg Oral Daily  . polyvinyl alcohol  1 drop Both Eyes BID  . rosuvastatin  40 mg Oral Daily  . senna-docusate  1 tablet Oral BID    Continuous Infusions: . diltiazem (CARDIZEM) infusion 5 mg/hr (11/29/17 1639)     Time spent: 35 mins, case discussed with cardiology I have personally reviewed and interpreted on  12/01/2017 daily labs, tele strips, imagings as discussed above under date review session and assessment and plans.  I reviewed all nursing notes, pharmacy notes, consultant notes,  vitals, pertinent old records  I have discussed plan of care as described above with RN , patient and family on 12/01/2017   Albertine Grates MD, PhD  [facility] Pager [phone]. If 7PM-7AM, please contact night-coverage at [email or web address], password [facility] 12/01/2017, 1:33 PM  LOS: 3 days

## 2017-12-01 NOTE — Progress Notes (Signed)
PHARMACIST - PHYSICIAN COMMUNICATION DR: Roda ShuttersXu CONCERNING: Antibiotic IV to Oral Route Change Policy  RECOMMENDATION: This patient is receiving azithromycin by the intravenous route.  Based on criteria approved by the Pharmacy and Therapeutics Committee, the antibiotic is being converted to the equivalent oral dose form.   DESCRIPTION: These criteria include:  Patient being treated for a respiratory tract infection, urinary tract infection, cellulitis or clostridium difficile associated diarrhea if on metronidazole  The patient is not neutropenic and does not exhibit a GI malabsorption state  The patient is eating (either orally or via tube) and/or has been taking other orally administered medications for a least 24 hours  The patient is improving clinically and has a Tmax < 100.5  If you have questions about this conversion, please contact the Pharmacy Department. Thank you.  Arvilla MarketMelissa Khaled Herda, PharmD PGY1 Pharmacy Resident Phone [phone] 12/01/2017     1:03 PM

## 2017-12-02 LAB — GLUCOSE, CAPILLARY
Glucose-Capillary: 174 mg/dL — ABNORMAL HIGH (ref 70–99)
Glucose-Capillary: 269 mg/dL — ABNORMAL HIGH (ref 70–99)
Glucose-Capillary: 273 mg/dL — ABNORMAL HIGH (ref 70–99)
Glucose-Capillary: 208 mg/dL — ABNORMAL HIGH (ref 70–99)

## 2017-12-02 LAB — BASIC METABOLIC PANEL
Anion gap: 9 (ref 5–15)
BUN: 67 mg/dL — AB (ref 8–23)
CO2: 27 mmol/L (ref 22–32)
CREATININE: 1.67 mg/dL — AB (ref 0.61–1.24)
Calcium: 8.8 mg/dL — ABNORMAL LOW (ref 8.9–10.3)
Chloride: 102 mmol/L (ref 98–111)
GFR calc Af Amer: 41 mL/min — ABNORMAL LOW (ref >60)
GFR calc non Af Amer: 35 mL/min — ABNORMAL LOW (ref >60)
GLUCOSE: 232 mg/dL — AB (ref 70–99)
Potassium: 4.6 mmol/L (ref 3.5–5.1)
SODIUM: 138 mmol/L (ref 135–145)

## 2017-12-02 LAB — CBC
HCT: 37 % — ABNORMAL LOW (ref 39.0–52.0)
HEMOGLOBIN: 11.7 g/dL — AB (ref 13.0–17.0)
MCH: 31 pg (ref 26.0–34.0)
MCHC: 31.6 g/dL (ref 30.0–36.0)
MCV: 98.1 fL (ref 78.0–100.0)
Platelets: 162 10*3/uL (ref 150–400)
RBC: 3.77 MIL/uL — ABNORMAL LOW (ref 4.22–5.81)
RDW: 11.7 % (ref 11.5–15.5)
WBC: 8.6 10*3/uL (ref 4.0–10.5)

## 2017-12-02 MED ORDER — LEVALBUTEROL HCL 1.25 MG/0.5ML IN NEBU: 1.2500 mg | INHALATION_SOLUTION | Freq: Four times a day (QID) | RESPIRATORY_TRACT | Status: DC | PRN

## 2017-12-02 MED ORDER — INSULIN GLARGINE 100 UNIT/ML ~~LOC~~ SOLN: 5.0000 [IU] | Freq: Every day | SUBCUTANEOUS | Status: DC

## 2017-12-02 MED ORDER — PREDNISONE 20 MG PO TABS
50.0000 mg | ORAL_TABLET | Freq: Every day | ORAL | Status: DC
  Administered 2017-12-03: 50 mg via ORAL
  Filled 2017-12-02: qty 2

## 2017-12-02 MED ORDER — POLYETHYLENE GLYCOL 3350 17 G PO PACK
17.0000 g | PACK | Freq: Every day | ORAL | Status: DC
  Administered 2017-12-02 – 2017-12-03 (×2): 17 g via ORAL
  Filled 2017-12-02 (×3): qty 1

## 2017-12-02 NOTE — Progress Notes (Signed)
PROGRESS NOTE  Timothy XIAO ZOX:[id number] DOB: 1928-12-20 DOA: 11/28/2017 PCP: Hannah Beat, MD  HPI/Recap of past 24 hours:  Feeling better, remain in aflutter, rate controlled remain DOE with minimal activity,   less wheezing,  no cough, no fever, no edema, denies chest pain  C/o last bm a week ago  Assessment/Plan: Principal Problem:   Sepsis (HCC) Active Problems:   DM (diabetes mellitus), type 2 with renal complications (HCC)   Essential hypertension   CAD, AUTOLOGOUS BYPASS GRAFT   DIASTOLIC HEART FAILURE, CHRONIC   BPH (benign prostatic hyperplasia)   COPD with acute exacerbation (HCC)   Atrial fibrillation and flutter (HCC)   Typical atrial flutter (HCC)   COPD exacerbation/bronchitis/early pna/sepsis/acute on chronic hypoxic respiratory failure: -Patient presents with temperature 100.4 F, tachycardia, and tachypnea. WBC was elevated at 13.7 --initial cxr no acute infiltrate, repeat cxr with trace pleural fluids, chronic cardiomegaly, copd. -troponin negative -urine strep pneumo and legionella  antigen negative, mrsa screening negative, sputum culture in process, blood culture no growth, respiratory viral panel negative -initial procalcitonin 0.42, procalcitonin trending down -continue to improve,  D/c iv steroids, change to prednisone , first dose to start on 8/26, continue  augmentin/oral zithro for two more days, last dose on 8/27, continue nebs/mucinex -wean oxygen as tolerated (baseline not oxygen dependent), he will need home o2 ( he reports checking his o2 sats at home, it goes down to low 80's when he walks for short distance, this has been going on for a year)  New onset aflutter/RVR -heart rate not well controlled by betablocker alone initially,  initially required cardizem drip, heart rate improving, now off cardizem drip -he is started on heparin drip on admission, now transitioned to apixaban per cardiology -echo with preserved LVEF,  troponin negative - may need cardioversion, will follow cardiology recommendation, -appreciate cardiology input  chronic diastolic chf -he does not appear significant volume overloaded, will continue home oral dose of lasix -close monitor volume status -cardiology following  CAD s/p CABG -denies chest pain, troponin negative -continue on betablocker, asa, statin -cardiology following  AKI on CKDIII -cr on admission is 1.89 (baseline cr 1.5) -ua unremarkable -cr today is 1.67, repeat bmp, renal dosing meds  nonsinuslin dependent dm2 a1c in 07/2017 was 7.7%, repeat a1c 7.8 Home meds glipizide held since admission Wean steroids,  decrease insulin   BPH - Continue doxazosin  Constipation: senokot s started on 8/24, add on miralax on 8/25  Code Status: full  Family Communication: patient   Disposition Plan: home with home health/home o2  in 2-3 days , needs cardiology clearance, may needs cardioversion on Tuesday   Consultants:  cardiology  Procedures:  none  Antibiotics:  As above    Objective: BP (!) 116/53 (BP Location: Left Arm)   Pulse 96   Temp (!) 97.5 F (36.4 C) (Oral)   Resp 20   Ht 5\' 6"  (1.676 m)   Wt 82 kg   SpO2 94%   BMI 29.18 kg/m   Intake/Output Summary (Last 24 hours) at 12/02/2017 0941 Last data filed at 12/02/2017 0545 Gross per 24 hour  Intake 720 ml  Output 375 ml  Net 345 ml   Filed Weights   11/30/17 0600 12/01/17 0341 12/02/17 0630  Weight: 82 kg 83.2 kg 82 kg    Exam: Patient is examined daily including today on 12/02/2017, exams remain the same as of yesterday except that has changed    General:  Frail, but NAD  Cardiovascular:  in aflutter, rate controlled  Respiratory: diminished, less wheezing  Abdomen: Soft/ND/NT, positive BS  Musculoskeletal: No Edema in right leg, mild edema left leg  Neuro: alert, oriented   Data Reviewed: Basic Metabolic Panel: Recent Labs  Lab 11/28/17 0748  11/30/17 0255 12/01/17 0259 12/02/17 0254  NA 139 137 139 138  K 5.1 4.6 4.3 4.6  CL 102 101 103 102  CO2 23 28 27 27   GLUCOSE 254* 237*  244* 264* 232*  BUN 32* 63* 71* 67*  CREATININE 1.89* 1.88* 1.74* 1.67*  CALCIUM 9.7 8.8* 8.9 8.8*  MG 1.8 2.3  --   --    Liver Function Tests: Recent Labs  Lab 11/30/17 0255  AST 26  ALT 15  ALKPHOS 52  BILITOT 0.4  PROT 5.9*  ALBUMIN 3.0*   No results for input(s): LIPASE, AMYLASE in the last 168 hours. No results for input(s): AMMONIA in the last 168 hours. CBC: Recent Labs  Lab 11/28/17 0748 11/29/17 0708 11/30/17 0255 12/01/17 0259 12/02/17 0254  WBC 13.7* 7.4 8.2 8.4 8.6  HGB 13.1 11.9* 11.1* 11.4* 11.7*  HCT 42.1 38.3* 35.8* 36.2* 37.0*  MCV 99.1 99.2 98.9 98.9 98.1  PLT 174 141* 161 169 162   Cardiac Enzymes:   Recent Labs  Lab 11/28/17 1347 11/28/17 2052 11/29/17 0018  TROPONINI <0.03 <0.03 <0.03   BNP (last 3 results) Recent Labs    07/30/17 1644 11/28/17 0748  BNP 127.4* 315.0*    ProBNP (last 3 results) No results for input(s): PROBNP in the last 8760 hours.  CBG: Recent Labs  Lab 12/01/17 0800 12/01/17 1137 12/01/17 1631 12/01/17 2111 12/02/17 0752  GLUCAP 239* 244* 285* 253* 174*    Recent Results (from the past 240 hour(s))  Urine culture     Status: Abnormal   Collection Time: 11/28/17  8:58 AM  Result Value Ref Range Status   Specimen Description URINE, RANDOM  Final   Special Requests NONE  Final   Culture (A)  Final    <10,000 COLONIES/mL INSIGNIFICANT GROWTH Performed at [facility], [address], [place], [place] [address]    Report Status 11/29/2017 FINAL  Final  Gram stain     Status: None   Collection Time: 11/28/17  2:18 PM  Result Value Ref Range Status   Specimen Description TRACHEAL SITE  Final   Special Requests NONE  Final   Gram Stain   Final    ABUNDANT WBC PRESENT, PREDOMINANTLY PMN MODERATE GRAM POSITIVE COCCI MODERATE GRAM VARIABLE ROD RARE  YEAST ABUNDANT SQUAMOUS EPITHELIAL CELLS PRESENT Performed at [facility], [address], [place], [place] [address]    Report Status 11/29/2017 FINAL  Final  Respiratory Panel by PCR     Status: None   Collection Time: 11/28/17  2:19 PM  Result Value Ref Range Status   Adenovirus NOT DETECTED NOT DETECTED Final   Coronavirus 229E NOT DETECTED NOT DETECTED Final   Coronavirus HKU1 NOT DETECTED NOT DETECTED Final   Coronavirus NL63 NOT DETECTED NOT DETECTED Final   Coronavirus OC43 NOT DETECTED NOT DETECTED Final   Metapneumovirus NOT DETECTED NOT DETECTED Final   Rhinovirus / Enterovirus NOT DETECTED NOT DETECTED Final   Influenza A NOT DETECTED NOT DETECTED Final   Influenza B NOT DETECTED NOT DETECTED Final   Parainfluenza Virus 1 NOT DETECTED NOT DETECTED Final   Parainfluenza Virus 2 NOT DETECTED NOT DETECTED Final   Parainfluenza Virus 3 NOT DETECTED NOT DETECTED Final  Parainfluenza Virus 4 NOT DETECTED NOT DETECTED Final   Respiratory Syncytial Virus NOT DETECTED NOT DETECTED Final   Bordetella pertussis NOT DETECTED NOT DETECTED Final   Chlamydophila pneumoniae NOT DETECTED NOT DETECTED Final   Mycoplasma pneumoniae NOT DETECTED NOT DETECTED Final    Comment: Performed at [facility], [address] [place], [place] [address]  Blood culture (routine x 2)     Status: None (Preliminary result)   Collection Time: 11/28/17  2:40 PM  Result Value Ref Range Status   Specimen Description BLOOD RIGHT ANTECUBITAL  Final   Special Requests   Final    BOTTLES DRAWN AEROBIC AND ANAEROBIC Blood Culture adequate volume   Culture   Final    NO GROWTH 3 DAYS Performed at [facility], [address] [place], [place] [address]    Report Status PENDING  Incomplete  Blood culture (routine x 2)     Status: None (Preliminary result)   Collection Time: 11/28/17  4:03 PM  Result Value Ref Range Status   Specimen Description BLOOD LEFT ANTECUBITAL  Final   Special  Requests   Final    BOTTLES DRAWN AEROBIC AND ANAEROBIC Blood Culture adequate volume   Culture   Final    NO GROWTH 3 DAYS Performed at [facility], [address] [place], [place] [address]    Report Status PENDING  Incomplete  MRSA PCR Screening     Status: None   Collection Time: 11/29/17  9:14 PM  Result Value Ref Range Status   MRSA by PCR NEGATIVE NEGATIVE Final    Comment:        The GeneXpert MRSA Assay (FDA approved for NASAL specimens only), is one component of a comprehensive MRSA colonization surveillance program. It is not intended to diagnose MRSA infection nor to guide or monitor treatment for MRSA infections. Performed at [facility], [address] [place], [place] [address]      Studies: No results found.  Scheduled Meds: . amoxicillin-clavulanate  1 tablet Oral BID  . apixaban  2.5 mg Oral BID  . arformoterol  15 mcg Nebulization BID  . aspirin EC  81 mg Oral Daily  . azithromycin  500 mg Oral Daily  . budesonide (PULMICORT) nebulizer solution  0.5 mg Nebulization BID  . doxazosin  4 mg Oral QHS  . furosemide  20 mg Oral Daily  . guaiFENesin  600 mg Oral BID  . insulin aspart  0-15 Units Subcutaneous TID WC  . insulin aspart  0-5 Units Subcutaneous QHS  . insulin aspart  3 Units Subcutaneous TID WC  . insulin glargine  5 Units Subcutaneous BID  . methylPREDNISolone (SOLU-MEDROL) injection  60 mg Intravenous Q12H  . metoprolol  100 mg Oral Daily  . pantoprazole  40 mg Oral Daily  . polyethylene glycol  17 g Oral Daily  . polyvinyl alcohol  1 drop Both Eyes BID  . rosuvastatin  40 mg Oral Daily  . senna-docusate  1 tablet Oral BID    Continuous Infusions: . diltiazem (CARDIZEM) infusion 5 mg/hr (11/29/17 1639)     Time spent: 35 mins I have personally reviewed and interpreted on  12/02/2017 daily labs, tele strips, imagings as discussed above under date review session and assessment and plans.  I reviewed all nursing notes,  pharmacy notes, consultant notes,  vitals, pertinent old records  I have discussed plan of care as described above with RN , patient  on  12/02/2017   Albertine Grates MD, PhD  [facility] Pager [phone]. If 7PM-7AM, please contact night-coverage at [email or web address], password [facility] 12/02/2017, 9:41 AM  LOS: 4 days

## 2017-12-02 NOTE — Progress Notes (Signed)
Progress Note  Patient Name: Timothy Cordova Date of Encounter: 12/02/2017  Primary Cardiologist: Julien Nordmann, MD   Subjective  Feels better, off oxygen at rest. Rate 75 while asleep, 120s while awake and sitting in chair, promptly increases to 150s with minimal activity.  Inpatient Medications    Scheduled Meds: . amoxicillin-clavulanate  1 tablet Oral BID  . apixaban  2.5 mg Oral BID  . arformoterol  15 mcg Nebulization BID  . aspirin EC  81 mg Oral Daily  . azithromycin  500 mg Oral Daily  . budesonide (PULMICORT) nebulizer solution  0.5 mg Nebulization BID  . doxazosin  4 mg Oral QHS  . furosemide  20 mg Oral Daily  . guaiFENesin  600 mg Oral BID  . insulin aspart  0-15 Units Subcutaneous TID WC  . insulin aspart  0-5 Units Subcutaneous QHS  . insulin aspart  3 Units Subcutaneous TID WC  . [START ON 12/03/2017] insulin glargine  5 Units Subcutaneous QHS  . metoprolol  100 mg Oral Daily  . pantoprazole  40 mg Oral Daily  . polyethylene glycol  17 g Oral Daily  . polyvinyl alcohol  1 drop Both Eyes BID  . [START ON 12/03/2017] predniSONE  50 mg Oral Q breakfast  . rosuvastatin  40 mg Oral Daily  . senna-docusate  1 tablet Oral BID   Continuous Infusions: . diltiazem (CARDIZEM) infusion 5 mg/hr (11/29/17 1639)   PRN Meds: antiseptic oral rinse, levalbuterol   Vital Signs    Vitals:   12/02/17 0714 12/02/17 0728 12/02/17 0834 12/02/17 0854  BP:   (!) 116/53   Pulse:   (!) 46 96  Resp:      Temp:   (!) 97.5 F (36.4 C)   TempSrc:   Oral   SpO2: 97% 98% 94%   Weight:      Height:        Intake/Output Summary (Last 24 hours) at 12/02/2017 1154 Last data filed at 12/02/2017 0545 Gross per 24 hour  Intake 720 ml  Output 375 ml  Net 345 ml   Filed Weights   11/30/17 0600 12/01/17 0341 12/02/17 0630  Weight: 82 kg 83.2 kg 82 kg    Telemetry    Atrial flutter with variable AV block (4:1 at night, 2:1 with minimal activity)- Personally Reviewed  ECG     No new tracing- Personally Reviewed  Physical Exam  Comfortable at rest GEN: No acute distress.   Neck: No JVD Cardiac: RRR, widely split S2, no murmurs, rubs, or gallops.  Respiratory: Clear to auscultation bilaterally. GI: Soft, nontender, non-distended  MS: No edema; No deformity. Neuro:  Nonfocal  Psych: Normal affect   Labs    Chemistry Recent Labs  Lab 11/30/17 0255 12/01/17 0259 12/02/17 0254  NA 137 139 138  K 4.6 4.3 4.6  CL 101 103 102  CO2 28 27 27   GLUCOSE 237*  244* 264* 232*  BUN 63* 71* 67*  CREATININE 1.88* 1.74* 1.67*  CALCIUM 8.8* 8.9 8.8*  PROT 5.9*  --   --   ALBUMIN 3.0*  --   --   AST 26  --   --   ALT 15  --   --   ALKPHOS 52  --   --   BILITOT 0.4  --   --   GFRNONAA 31* 34* 35*  GFRAA 35* 39* 41*  ANIONGAP 8 9 9      Hematology Recent Labs  Lab 11/30/17 0255  12/01/17 0259 12/02/17 0254  WBC 8.2 8.4 8.6  RBC 3.62* 3.66* 3.77*  HGB 11.1* 11.4* 11.7*  HCT 35.8* 36.2* 37.0*  MCV 98.9 98.9 98.1  MCH 30.7 31.1 31.0  MCHC 31.0 31.5 31.6  RDW 11.9 11.7 11.7  PLT 161 169 162    Cardiac Enzymes Recent Labs  Lab 11/28/17 1347 11/28/17 2052 11/29/17 0018  TROPONINI <0.03 <0.03 <0.03    Recent Labs  Lab 11/28/17 0757  TROPIPOC 0.01     BNP Recent Labs  Lab 11/28/17 0748  BNP 315.0*     DDimer No results for input(s): DDIMER in the last 168 hours.   Radiology    No results found.  Cardiac Studies   Echo November 30, 2017  - Left ventricle: The cavity size was normal. There was mild concentric hypertrophy. Systolic function was normal. The estimated ejection fraction was in the range of 60% to 65%. Wall motion was normal; there were no regional wall motion abnormalities. - Aortic valve: There was trivial regurgitation. - Right atrium: The atrium was mildly dilated. - Pulmonary arteries: Systolic pressure was mildly increased. PA peak pressure: 38 mm Hg (S).  Patient Profile     84 y.o. male  with history of CAD (CABG 2006, patent graft by cath 2010 in 2015), chronic diastolic heart failure, severe COPD, essential hypertension, CKD stage III, diabetes mellitus, PAD with previous left subclavian artery stent, mild dementia, presenting with new onset atrial flutter with rapid ventricular response.  Left ventricular systolic function remains normal by echo.  No complaint of angina.  Assessment & Plan    1.  Atrial flutter: Rate control is inadequate.  Recommend proceeding to TEE guided cardioversion.  Try to organize for tomorrow but it may not be logistically possible until Tuesday.. 2.  CAD status post CABG: Denies angina pectoris at rest or with activity. 3.  Acute on chronic respiratory failure with hypoxia/acute exacerbation of COPD:  Improving, now walk oxygen at rest, but promptly desaturates with physical activity.  He has self documented exertional hypoxia at home for at least a year before this admission, relatively good health.  He will require home oxygen. 4. CKD3: Anticoagulant dose adjusted for age and renal function.     For questions or updates, please contact [facility] Please consult [email or web address] for contact info under Cardiology/STEMI.      Signed, Thurmon FairMihai Amali Uhls, MD  12/02/2017, 11:54 AM

## 2017-12-03 LAB — GLUCOSE, CAPILLARY
GLUCOSE-CAPILLARY: 94 mg/dL (ref 70–99)
GLUCOSE-CAPILLARY: 139 mg/dL — AB (ref 70–99)
GLUCOSE-CAPILLARY: 329 mg/dL — AB (ref 70–99)
Glucose-Capillary: 294 mg/dL — ABNORMAL HIGH (ref 70–99)

## 2017-12-03 LAB — CBC
HCT: 37.1 % — ABNORMAL LOW (ref 39.0–52.0)
HEMOGLOBIN: 11.8 g/dL — AB (ref 13.0–17.0)
MCH: 31 pg (ref 26.0–34.0)
MCHC: 31.8 g/dL (ref 30.0–36.0)
MCV: 97.4 fL (ref 78.0–100.0)
PLATELETS: 167 10*3/uL (ref 150–400)
RBC: 3.81 MIL/uL — AB (ref 4.22–5.81)
RDW: 11.7 % (ref 11.5–15.5)
WBC: 11.4 10*3/uL — ABNORMAL HIGH (ref 4.0–10.5)

## 2017-12-03 LAB — CULTURE, BLOOD (ROUTINE X 2)
CULTURE: NO GROWTH
Culture: NO GROWTH
SPECIAL REQUESTS: ADEQUATE
SPECIAL REQUESTS: ADEQUATE

## 2017-12-03 LAB — BASIC METABOLIC PANEL
Anion gap: 8 (ref 5–15)
BUN: 57 mg/dL — AB (ref 8–23)
CHLORIDE: 102 mmol/L (ref 98–111)
CO2: 28 mmol/L (ref 22–32)
CREATININE: 1.46 mg/dL — AB (ref 0.61–1.24)
Calcium: 8.8 mg/dL — ABNORMAL LOW (ref 8.9–10.3)
GFR, EST AFRICAN AMERICAN: 48 mL/min — AB (ref >60)
GFR, EST NON AFRICAN AMERICAN: 41 mL/min — AB (ref >60)
Glucose, Bld: 100 mg/dL — ABNORMAL HIGH (ref 70–99)
POTASSIUM: 4.4 mmol/L (ref 3.5–5.1)
SODIUM: 138 mmol/L (ref 135–145)

## 2017-12-03 MED ORDER — PREDNISONE 20 MG PO TABS
40.0000 mg | ORAL_TABLET | Freq: Every day | ORAL | Status: DC
  Administered 2017-12-04: 40 mg via ORAL
  Filled 2017-12-03: qty 2

## 2017-12-03 NOTE — Care Management (Signed)
12-03-17  BENEFITS CHECK :  # 6.    S/W [place]  @ [facility] [employer] # [phone]  1. ELIQUIS   2.5 MG BID COVER- YES CO-PAY- $ 294.46  Q/L TWO PILL PER DAY TIER- 3 DRUG PRIOR APPROVAL- NO  3. ELIQUIS  5 MG BID COVER - YES CO-PAY- $ 294.46   Q/L TWO PILL PER DAY TIER- 3 DRUG PRIOR APPROVAL- NO  DEDUCTIBLE: NOT MET  PREFERRED PHARMACY : YES [facility] , [facility] ,[facility]  AND [facility] M/O 90 DAY SUPPLY FOR M/O $ 378.46

## 2017-12-03 NOTE — Care Management Important Message (Signed)
Important Message  Patient Details  Name: Ermalene Postinhomas O Full MRN: [id number] Date of Birth: February 01, 1929   Medicare Important Message Given:  Yes    Keno Caraway 12/03/2017, 3:45 PM

## 2017-12-03 NOTE — Progress Notes (Signed)
Progress Note  Patient Name: Timothy Cordova Date of Encounter: 12/03/2017  Primary Cardiologist: Julien Nordmann, MD   Subjective   No chest pain but DOE. HR jumps up with activity.  Inpatient Medications    Scheduled Meds: . amoxicillin-clavulanate  1 tablet Oral BID  . apixaban  2.5 mg Oral BID  . arformoterol  15 mcg Nebulization BID  . aspirin EC  81 mg Oral Daily  . azithromycin  500 mg Oral Daily  . budesonide (PULMICORT) nebulizer solution  0.5 mg Nebulization BID  . doxazosin  4 mg Oral QHS  . furosemide  20 mg Oral Daily  . guaiFENesin  600 mg Oral BID  . insulin aspart  0-15 Units Subcutaneous TID WC  . insulin aspart  0-5 Units Subcutaneous QHS  . insulin aspart  3 Units Subcutaneous TID WC  . metoprolol  100 mg Oral Daily  . pantoprazole  40 mg Oral Daily  . polyethylene glycol  17 g Oral Daily  . polyvinyl alcohol  1 drop Both Eyes BID  . predniSONE  50 mg Oral Q breakfast  . rosuvastatin  40 mg Oral Daily  . senna-docusate  1 tablet Oral BID   Continuous Infusions: . diltiazem (CARDIZEM) infusion 5 mg/hr (11/29/17 1639)   PRN Meds: antiseptic oral rinse, levalbuterol   Vital Signs    Vitals:   12/03/17 0002 12/03/17 0500 12/03/17 0729 12/03/17 0950  BP: (!) 162/75   (!) 117/57  Pulse: 76   78  Resp: 17     Temp: (!) 97.5 F (36.4 C)     TempSrc: Oral     SpO2: 96%  95%   Weight:  83.4 kg    Height:        Intake/Output Summary (Last 24 hours) at 12/03/2017 1117 Last data filed at 12/02/2017 1600 Gross per 24 hour  Intake 480 ml  Output -  Net 480 ml   Filed Weights   12/01/17 0341 12/02/17 0630 12/03/17 0500  Weight: 83.2 kg 82 kg 83.4 kg    Telemetry    Atrial flutter with VR into the 120"s with activity. - Personally Reviewed  ECG    No new tracing. - Personally Reviewed  Physical Exam  NAD and well developed. GEN: No acute distress.   Neck: No JVD Cardiac: Rapid irregular rate and rhythm, no murmurs, rubs, or gallops.   Respiratory: Clear to auscultation bilaterally. GI: Soft, nontender, non-distended  MS: No edema; No deformity. Neuro:  Nonfocal  Psych: Normal affect   Labs    Chemistry Recent Labs  Lab 11/30/17 0255 12/01/17 0259 12/02/17 0254 12/03/17 0237  NA 137 139 138 138  K 4.6 4.3 4.6 4.4  CL 101 103 102 102  CO2 28 27 27 28   GLUCOSE 237*  244* 264* 232* 100*  BUN 63* 71* 67* 57*  CREATININE 1.88* 1.74* 1.67* 1.46*  CALCIUM 8.8* 8.9 8.8* 8.8*  PROT 5.9*  --   --   --   ALBUMIN 3.0*  --   --   --   AST 26  --   --   --   ALT 15  --   --   --   ALKPHOS 52  --   --   --   BILITOT 0.4  --   --   --   GFRNONAA 31* 34* 35* 41*  GFRAA 35* 39* 41* 48*  ANIONGAP 8 9 9 8      Hematology Recent Labs  Lab 12/01/17 0259 12/02/17 0254 12/03/17 0237  WBC 8.4 8.6 11.4*  RBC 3.66* 3.77* 3.81*  HGB 11.4* 11.7* 11.8*  HCT 36.2* 37.0* 37.1*  MCV 98.9 98.1 97.4  MCH 31.1 31.0 31.0  MCHC 31.5 31.6 31.8  RDW 11.7 11.7 11.7  PLT 169 162 167    Cardiac Enzymes Recent Labs  Lab 11/28/17 1347 11/28/17 2052 11/29/17 0018  TROPONINI <0.03 <0.03 <0.03    Recent Labs  Lab 11/28/17 0757  TROPIPOC 0.01     BNP Recent Labs  Lab 11/28/17 0748  BNP 315.0*     DDimer No results for input(s): DDIMER in the last 168 hours.   Radiology    No results found.  Cardiac Studies   ECHOCARDIOGRAPHY 11/30/2017: Study Conclusions  - Left ventricle: The cavity size was normal. There was mild   concentric hypertrophy. Systolic function was normal. The   estimated ejection fraction was in the range of 60% to 65%. Wall   motion was normal; there were no regional wall motion   abnormalities. - Aortic valve: There was trivial regurgitation. - Right atrium: The atrium was mildly dilated. - Pulmonary arteries: Systolic pressure was mildly increased. PA   peak pressure: 38 mm Hg (S).  Patient Profile     84 y.o. male with history of CAD (CABG 2006, patent graft by cath 2010 in 2015),  chronic diastolic heart failure, severe COPD, essential hypertension, CKD stage Cordova, diabetes mellitus, PAD with previous left subclavian artery stent, mild dementia, presenting with new onset atrial flutter with rapid ventricular response. Left ventricular systolic function remains normal by echo. No complaint of angina. Rtae control has been poor.  Assessment & Plan    1. Atrial flutter with rapid ventricular response with activity.  Plan transesophageal echo guided electrical cardioversion on Wednesday, 12/05/2017.  Will write orders tomorrow. 2. Chronic anticoagulation therapy has been started.  He has elevated CHADS VASC score will need anticoagulation therapy for at least 3 weeks post cardioversion and perhaps indefinitely. 3. Acute on chronic diastolic heart failure: Evaluate volume status and manage accordingly.  Check BNP. 4. CKD stage Cordova: Follow closely.  Electrical cardioversion not possible until 12/05/2017.  Will write orders tomorrow.     For questions or updates, please contact [facility] Please consult [email or web address] for contact info under Cardiology/STEMI.      Signed, Timothy NoeHenry W Smith III, MD  12/03/2017, 11:17 AM

## 2017-12-03 NOTE — Care Management Important Message (Signed)
Important Message  Patient Details  Name: Timothy Cordova MRN: [id number] Date of Birth: [birth year]   Medicare Important Message Given:  Yes    [name] Stefan ChurchBratton 12/03/2017, 3:44 PM

## 2017-12-03 NOTE — Progress Notes (Signed)
PROGRESS NOTE  Timothy Cordova ZOX:[id number] DOB: [birth year] DOA: 11/28/2017 PCP: Hannah Beat, MD  HPI/Recap of past 24 hours:  Feeling better, remain in aflutter, rate controlled remain DOE with minimal activity,    Wheezing has largely resolved no cough, no fever, no edema, denies chest pain    Assessment/Plan: Principal Problem:   Sepsis (HCC) Active Problems:   DM (diabetes mellitus), type 2 with renal complications (HCC)   Essential hypertension   CAD, AUTOLOGOUS BYPASS GRAFT   DIASTOLIC HEART FAILURE, CHRONIC   BPH (benign prostatic hyperplasia)   COPD with acute exacerbation (HCC)   Atrial fibrillation and flutter (HCC)   Typical atrial flutter (HCC)   COPD exacerbation/bronchitis/early pna/sepsis/acute on chronic hypoxic respiratory failure: -Patient presents with temperature 100.4 F, tachycardia, and tachypnea. WBC was elevated at 13.7 --initial cxr no acute infiltrate, repeat cxr with trace pleural fluids, chronic cardiomegaly, copd. -troponin negative -urine strep pneumo and legionella  antigen negative, mrsa screening negative, sputum culture in process, blood culture no growth, respiratory viral panel negative -initial procalcitonin 0.42, procalcitonin trending down -continue to improve,  D/c iv steroids, change to prednisone , first dose to start on 8/26, continue  augmentin/oral zithro for two more days, last dose on 8/27, continue nebs/mucinex -wean oxygen as tolerated (baseline not oxygen dependent), he will need home o2 ( he reports checking his o2 sats at home, it goes down to low 80's when he walks for short distance, this has been going on for a year)  New onset aflutter/RVR -heart rate not well controlled by betablocker alone initially,  initially required cardizem drip, heart rate improving, now off cardizem drip -he is started on heparin drip on admission, now transitioned to apixaban per cardiology -echo with preserved LVEF, troponin  negative - cardioversion planned on 8/28, will follow cardiology recommendation, -appreciate cardiology input  chronic diastolic chf -he does not appear significant volume overloaded, will continue home oral dose of lasix -close monitor volume status -cardiology following  CAD s/p CABG -denies chest pain, troponin negative -continue on betablocker, asa, statin -cardiology following  AKI on CKDIII -cr on admission is 1.89 (baseline cr 1.5) -ua unremarkable -cr today is 1.46, repeat bmp, renal dosing meds  nonsinuslin dependent dm2 a1c in 07/2017 was 7.7%, repeat a1c 7.8 Home meds glipizide held since admission Wean steroids,  decrease insulin (d/c lantus, continue meal coverage and ssi)   BPH - Continue doxazosin  Constipation: senokot s started on 8/24, add on miralax on 8/25  Code Status: full  Family Communication: patient   Disposition Plan: home with home health/home o2  in 2-3 days , needs cardiology clearance   Consultants:  cardiology  Procedures:  Cardioversion planned on 8/28  Antibiotics:  As above    Objective: BP (!) 162/75   Pulse 76   Temp (!) 97.5 F (36.4 C) (Oral)   Resp 17   Ht 5\' 6"  (1.676 m)   Wt 83.4 kg   SpO2 95%   BMI 29.68 kg/m   Intake/Output Summary (Last 24 hours) at 12/03/2017 0750 Last data filed at 12/02/2017 1600 Gross per 24 hour  Intake 480 ml  Output -  Net 480 ml   Filed Weights   12/01/17 0341 12/02/17 0630 12/03/17 0500  Weight: 83.2 kg 82 kg 83.4 kg    Exam: Patient is examined daily including today on 12/03/2017, exams remain the same as of yesterday except that has changed    General:  Frail, but NAD  Cardiovascular: in aflutter,  rate controlled  Respiratory: diminished, wheezing has almost resolved  Abdomen: Soft/ND/NT, positive BS  Musculoskeletal: on edema appreciated today  Neuro: alert, oriented   Data Reviewed: Basic Metabolic Panel: Recent Labs  Lab 11/28/17 0748  11/30/17 0255 12/01/17 0259 12/02/17 0254 12/03/17 0237  NA 139 137 139 138 138  K 5.1 4.6 4.3 4.6 4.4  CL 102 101 103 102 102  CO2 23 28 27 27 28   GLUCOSE 254* 237*  244* 264* 232* 100*  BUN 32* 63* 71* 67* 57*  CREATININE 1.89* 1.88* 1.74* 1.67* 1.46*  CALCIUM 9.7 8.8* 8.9 8.8* 8.8*  MG 1.8 2.3  --   --   --    Liver Function Tests: Recent Labs  Lab 11/30/17 0255  AST 26  ALT 15  ALKPHOS 52  BILITOT 0.4  PROT 5.9*  ALBUMIN 3.0*   No results for input(s): LIPASE, AMYLASE in the last 168 hours. No results for input(s): AMMONIA in the last 168 hours. CBC: Recent Labs  Lab 11/29/17 0708 11/30/17 0255 12/01/17 0259 12/02/17 0254 12/03/17 0237  WBC 7.4 8.2 8.4 8.6 11.4*  HGB 11.9* 11.1* 11.4* 11.7* 11.8*  HCT 38.3* 35.8* 36.2* 37.0* 37.1*  MCV 99.2 98.9 98.9 98.1 97.4  PLT 141* 161 169 162 167   Cardiac Enzymes:   Recent Labs  Lab 11/28/17 1347 11/28/17 2052 11/29/17 0018  TROPONINI <0.03 <0.03 <0.03   BNP (last 3 results) Recent Labs    07/30/17 1644 11/28/17 0748  BNP 127.4* 315.0*    ProBNP (last 3 results) No results for input(s): PROBNP in the last 8760 hours.  CBG: Recent Labs  Lab 12/02/17 0752 12/02/17 1157 12/02/17 1728 12/02/17 2039 12/03/17 0743  GLUCAP 174* 269* 273* 208* 94    Recent Results (from the past 240 hour(s))  Urine culture     Status: Abnormal   Collection Time: 11/28/17  8:58 AM  Result Value Ref Range Status   Specimen Description URINE, RANDOM  Final   Special Requests NONE  Final   Culture (A)  Final    <10,000 COLONIES/mL INSIGNIFICANT GROWTH Performed at [facility], [address], [place], [place] [address]    Report Status 11/29/2017 FINAL  Final  Gram stain     Status: None   Collection Time: 11/28/17  2:18 PM  Result Value Ref Range Status   Specimen Description TRACHEAL SITE  Final   Special Requests NONE  Final   Gram Stain   Final    ABUNDANT WBC PRESENT, PREDOMINANTLY PMN MODERATE GRAM  POSITIVE COCCI MODERATE GRAM VARIABLE ROD RARE YEAST ABUNDANT SQUAMOUS EPITHELIAL CELLS PRESENT Performed at [facility], [address] [place], [place] [address]    Report Status 11/29/2017 FINAL  Final  Respiratory Panel by PCR     Status: None   Collection Time: 11/28/17  2:19 PM  Result Value Ref Range Status   Adenovirus NOT DETECTED NOT DETECTED Final   Coronavirus 229E NOT DETECTED NOT DETECTED Final   Coronavirus HKU1 NOT DETECTED NOT DETECTED Final   Coronavirus NL63 NOT DETECTED NOT DETECTED Final   Coronavirus OC43 NOT DETECTED NOT DETECTED Final   Metapneumovirus NOT DETECTED NOT DETECTED Final   Rhinovirus / Enterovirus NOT DETECTED NOT DETECTED Final   Influenza A NOT DETECTED NOT DETECTED Final   Influenza B NOT DETECTED NOT DETECTED Final   Parainfluenza Virus 1 NOT DETECTED NOT DETECTED Final   Parainfluenza Virus 2 NOT DETECTED NOT DETECTED Final  Parainfluenza Virus 3 NOT DETECTED NOT DETECTED Final   Parainfluenza Virus 4 NOT DETECTED NOT DETECTED Final   Respiratory Syncytial Virus NOT DETECTED NOT DETECTED Final   Bordetella pertussis NOT DETECTED NOT DETECTED Final   Chlamydophila pneumoniae NOT DETECTED NOT DETECTED Final   Mycoplasma pneumoniae NOT DETECTED NOT DETECTED Final    Comment: Performed at [facility], [address] [place], [place] [address]  Blood culture (routine x 2)     Status: None (Preliminary result)   Collection Time: 11/28/17  2:40 PM  Result Value Ref Range Status   Specimen Description BLOOD RIGHT ANTECUBITAL  Final   Special Requests   Final    BOTTLES DRAWN AEROBIC AND ANAEROBIC Blood Culture adequate volume   Culture   Final    NO GROWTH 4 DAYS Performed at [facility], [address] [place], [place] [address]    Report Status PENDING  Incomplete  Blood culture (routine x 2)     Status: None (Preliminary result)   Collection Time: 11/28/17  4:03 PM  Result Value Ref Range Status   Specimen  Description BLOOD LEFT ANTECUBITAL  Final   Special Requests   Final    BOTTLES DRAWN AEROBIC AND ANAEROBIC Blood Culture adequate volume   Culture   Final    NO GROWTH 4 DAYS Performed at [facility], [address] [place], [place] [address]    Report Status PENDING  Incomplete  MRSA PCR Screening     Status: None   Collection Time: 11/29/17  9:14 PM  Result Value Ref Range Status   MRSA by PCR NEGATIVE NEGATIVE Final    Comment:        The GeneXpert MRSA Assay (FDA approved for NASAL specimens only), is one component of a comprehensive MRSA colonization surveillance program. It is not intended to diagnose MRSA infection nor to guide or monitor treatment for MRSA infections. Performed at [facility], [address] [place], [place] [address]      Studies: No results found.  Scheduled Meds: . amoxicillin-clavulanate  1 tablet Oral BID  . apixaban  2.5 mg Oral BID  . arformoterol  15 mcg Nebulization BID  . aspirin EC  81 mg Oral Daily  . azithromycin  500 mg Oral Daily  . budesonide (PULMICORT) nebulizer solution  0.5 mg Nebulization BID  . doxazosin  4 mg Oral QHS  . furosemide  20 mg Oral Daily  . guaiFENesin  600 mg Oral BID  . insulin aspart  0-15 Units Subcutaneous TID WC  . insulin aspart  0-5 Units Subcutaneous QHS  . insulin aspart  3 Units Subcutaneous TID WC  . insulin glargine  5 Units Subcutaneous QHS  . metoprolol  100 mg Oral Daily  . pantoprazole  40 mg Oral Daily  . polyethylene glycol  17 g Oral Daily  . polyvinyl alcohol  1 drop Both Eyes BID  . predniSONE  50 mg Oral Q breakfast  . rosuvastatin  40 mg Oral Daily  . senna-docusate  1 tablet Oral BID    Continuous Infusions: . diltiazem (CARDIZEM) infusion 5 mg/hr (11/29/17 1639)     Time spent: 35 mins, case discussed with cardiology Dr Katrinka BlazingSmith on 8/26 I have personally reviewed and interpreted on  12/03/2017 daily labs, tele strips, imagings as discussed above under date  review session and assessment and plans.  I reviewed all nursing notes, pharmacy notes, consultant notes,  vitals, pertinent old  records  I have discussed plan of care as described above with RN , patient  on 12/03/2017   Albertine Grates MD, PhD  [facility] Pager [phone]. If 7PM-7AM, please contact night-coverage at [email or web address], password [facility] 12/03/2017, 7:50 AM  LOS: 5 days

## 2017-12-03 NOTE — Progress Notes (Signed)
Occupational Therapy Treatment Patient Details Name: Timothy Cordova MRN: [id number] DOB: [birth year] Today's Date: 12/03/2017    History of present illness Timothy Cordova is an 84yo M who presented to the ED with SOB. Pt was admitted with sepsis dx. PMH includes HTN, diastolic CHF, CKD stage III, COPD.    OT comments  Pt making good progress. Able to complete basic ADL and ambulate @ 80 ft with SpO2 @ 94 RA. Dyspnea 2/4. Educated on energy conservation strategies. Pt discussed how he is his wife's primary caregiver. Feel pt would benefit from homecare resources for his wife. OT signing off.   Follow Up Recommendations  No OT follow up    Equipment Recommendations  None recommended by OT    Recommendations for Other Services      Precautions / Restrictions Precautions Precautions: Fall       Mobility Bed Mobility Overal bed mobility: Modified Independent                Transfers Overall transfer level: Modified independent                    Balance Overall balance assessment: Mild deficits observed, not formally tested                                         ADL either performed or assessed with clinical judgement   ADL Overall ADL's : At baseline                                     Functional mobility during ADLs: Supervision/safety General ADL Comments: Educated on energy conservation strategies for ADL /IADL tasks. Pt states he is the caregiver for his wife.Discussed options to decrease burdenof care. Reviewed energy conservation strategies, including timing of shower and use of wife's shower seat. Recommend pt initially use RW for mobility.       Vision       Perception     Praxis      Cognition Arousal/Alertness: Awake/alert Behavior During Therapy: WFL for tasks assessed/performed Overall Cognitive Status: Within Functional Limits for tasks assessed                                           Exercises     Shoulder Instructions       General Comments      Pertinent Vitals/ Pain       Pain Assessment: No/denies pain  Home Living                                          Prior Functioning/Environment              Frequency  Min 2X/week        Progress Toward Goals  OT Goals(current goals can now be found in the care plan section)  Progress towards OT goals: Goals met/education completed, patient discharged from OT  Acute Rehab OT Goals Patient Stated Goal: "to go back home with my wife" OT Goal Formulation: With patient Time For Goal Achievement: 11/29/17 Potential to Achieve Goals: Good ADL Goals Pt  Will Perform Upper Body Bathing: with modified independence Pt Will Perform Lower Body Bathing: with modified independence Pt Will Perform Upper Body Dressing: with modified independence Pt Will Perform Lower Body Dressing: with modified independence Pt Will Transfer to Toilet: with modified independence Pt Will Perform Toileting - Clothing Manipulation and hygiene: with modified independence Pt Will Perform Tub/Shower Transfer: with modified independence  Plan Discharge plan remains appropriate    Co-evaluation                 AM-PAC PT "6 Clicks" Daily Activity     Outcome Measure   Help from another person eating meals?: None Help from another person taking care of personal grooming?: None Help from another person toileting, which includes using toliet, bedpan, or urinal?: None Help from another person bathing (including washing, rinsing, drying)?: None Help from another person to put on and taking off regular upper body clothing?: None Help from another person to put on and taking off regular lower body clothing?: None 6 Click Score: 24    End of Session Equipment Utilized During Treatment: Gait belt  OT Visit Diagnosis: Muscle weakness (generalized) (M62.81)   Activity Tolerance Patient tolerated treatment  well   Patient Left in chair;with call bell/phone within reach   Nurse Communication Mobility status        Time: 5732-2025 OT Time Calculation (min): 20 min  Charges: OT General Charges $OT Visit: 1 Visit OT Treatments $Self Care/Home Management : 8-22 mins  Maurie Boettcher, OT/L  OT Clinical Specialist [phone]    [facility] 12/03/2017, 11:43 AM

## 2017-12-03 NOTE — Progress Notes (Signed)
Physical Therapy Treatment Patient Details Name: Timothy Cordova MRN: [id number] DOB: 21-Jan-1929 Today's Date: 12/03/2017    History of Present Illness Timothy Cordova is an 84yo M who presented to the ED with SOB. Pt was admitted with sepsis dx. PMH includes HTN, diastolic CHF, CKD stage III, COPD.     PT Comments    Pt making good progress towards his goals, however continues to be limited in safe mobility with 4/4 DoE after ambulation of 125 feet. Pt requires seated rest break to recover, SaO2 on RA remained >90%O2 with ambulation. Discussed the need for ambulation with Rollator not for balance but for insuring a safe place to rest when he gets SoB. Pt in agreement. PT will continue to follow acutely.     Follow Up Recommendations  Home health PT;Supervision - Intermittent     Equipment Recommendations  None recommended by PT    Recommendations for Other Services       Precautions / Restrictions Precautions Precautions: Fall Restrictions Weight Bearing Restrictions: No    Mobility  Bed Mobility Overal bed mobility: Modified Independent                Transfers Overall transfer level: Modified independent                  Ambulation/Gait Ambulation/Gait assistance: Supervision Gait Distance (Feet): 250 Feet Assistive device: None Gait Pattern/deviations: Step-through pattern;Shuffle;Trunk flexed Gait velocity: slowed Gait velocity interpretation: 1.31 - 2.62 ft/sec, indicative of limited community ambulator General Gait Details: supervision for ambulation, requires 1x sitting rest break for 4/4 DoE, pt with limited participation in higher level balance with gait         Balance Overall balance assessment: Needs assistance   Sitting balance-Leahy Scale: Normal     Standing balance support: No upper extremity supported;During functional activity Standing balance-Leahy Scale: Good               High level balance activites: Head  turns High Level Balance Comments: pt self limiting in moving head side to side instead shifting eyes even with vc for head turns            Cognition Arousal/Alertness: Awake/alert Behavior During Therapy: WFL for tasks assessed/performed Overall Cognitive Status: Within Functional Limits for tasks assessed                                           General Comments General comments (skin integrity, edema, etc.): SaO2 on RA >90%O2 throughout ambulataion, even with 4/4 DoE. max HR with ambulation 111bpm      Pertinent Vitals/Pain Pain Assessment: No/denies pain           PT Goals (current goals can now be found in the care plan section) Acute Rehab PT Goals Patient Stated Goal: "to go back home with my wife" PT Goal Formulation: With patient/family Time For Goal Achievement: 12/14/17 Potential to Achieve Goals: Good Progress towards PT goals: Progressing toward goals    Frequency    Min 3X/week      PT Plan Current plan remains appropriate       AM-PAC PT "6 Clicks" Daily Activity  Outcome Measure  Difficulty turning over in bed (including adjusting bedclothes, sheets and blankets)?: None Difficulty moving from lying on back to sitting on the side of the bed? : None Difficulty sitting down on and standing up from a  chair with arms (e.g., wheelchair, bedside commode, etc,.)?: None Help needed moving to and from a bed to chair (including a wheelchair)?: None Help needed walking in hospital room?: None Help needed climbing 3-5 steps with a railing? : A Little 6 Click Score: 23    End of Session Equipment Utilized During Treatment: Gait belt Activity Tolerance: Patient limited by fatigue Patient left: in chair;with call bell/phone within reach;with chair alarm set;with family/visitor present Nurse Communication: Mobility status PT Visit Diagnosis: Other abnormalities of gait and mobility (R26.89);History of falling (Z91.81);Muscle weakness  (generalized) (M62.81);Difficulty in walking, not elsewhere classified (R26.2)     Time: 4098-1191 PT Time Calculation (min) (ACUTE ONLY): 19 min  Charges:  $Gait Training: 8-22 mins                     Rodney Yera B. Beverely Risen PT, DPT Acute Rehabilitation  [phone] Pager [phone]     Elon Alas Fleet 12/03/2017, 3:13 PM

## 2017-12-04 LAB — CBC
HCT: 39.8 % (ref 39.0–52.0)
HEMOGLOBIN: 12.8 g/dL — AB (ref 13.0–17.0)
MCH: 31.1 pg (ref 26.0–34.0)
MCHC: 32.2 g/dL (ref 30.0–36.0)
MCV: 96.6 fL (ref 78.0–100.0)
Platelets: 176 10*3/uL (ref 150–400)
RBC: 4.12 MIL/uL — AB (ref 4.22–5.81)
RDW: 11.7 % (ref 11.5–15.5)
WBC: 12.5 10*3/uL — AB (ref 4.0–10.5)

## 2017-12-04 LAB — BASIC METABOLIC PANEL
Anion gap: 7 (ref 5–15)
Anion gap: 7 (ref 5–15)
BUN: 52 mg/dL — ABNORMAL HIGH (ref 8–23)
BUN: 56 mg/dL — ABNORMAL HIGH (ref 8–23)
CHLORIDE: 103 mmol/L (ref 98–111)
CHLORIDE: 102 mmol/L (ref 98–111)
CO2: 26 mmol/L (ref 22–32)
CO2: 28 mmol/L (ref 22–32)
CREATININE: 1.76 mg/dL — AB (ref 0.61–1.24)
Calcium: 8.8 mg/dL — ABNORMAL LOW (ref 8.9–10.3)
Calcium: 8.9 mg/dL (ref 8.9–10.3)
Creatinine, Ser: 1.58 mg/dL — ABNORMAL HIGH (ref 0.61–1.24)
GFR calc non Af Amer: 38 mL/min — ABNORMAL LOW (ref >60)
GFR calc non Af Amer: 33 mL/min — ABNORMAL LOW (ref >60)
GFR, EST AFRICAN AMERICAN: 44 mL/min — AB (ref >60)
GFR, EST AFRICAN AMERICAN: 38 mL/min — AB (ref >60)
Glucose, Bld: 269 mg/dL — ABNORMAL HIGH (ref 70–99)
Glucose, Bld: 338 mg/dL — ABNORMAL HIGH (ref 70–99)
POTASSIUM: 4.5 mmol/L (ref 3.5–5.1)
Potassium: 5.1 mmol/L (ref 3.5–5.1)
SODIUM: 136 mmol/L (ref 135–145)
Sodium: 137 mmol/L (ref 135–145)

## 2017-12-04 LAB — GLUCOSE, CAPILLARY
GLUCOSE-CAPILLARY: 231 mg/dL — AB (ref 70–99)
GLUCOSE-CAPILLARY: 270 mg/dL — AB (ref 70–99)
Glucose-Capillary: 267 mg/dL — ABNORMAL HIGH (ref 70–99)
Glucose-Capillary: 348 mg/dL — ABNORMAL HIGH (ref 70–99)

## 2017-12-04 MED ORDER — SODIUM CHLORIDE 0.9 % IV SOLN
INTRAVENOUS | Status: DC
  Administered 2017-12-05: 04:00:00 via INTRAVENOUS

## 2017-12-04 MED ORDER — SODIUM CHLORIDE 0.9% FLUSH: 3.0000 mL | INTRAVENOUS | Status: DC | PRN

## 2017-12-04 MED ORDER — PREDNISONE 20 MG PO TABS
30.0000 mg | ORAL_TABLET | Freq: Every day | ORAL | Status: DC
  Administered 2017-12-05: 30 mg via ORAL
  Filled 2017-12-04: qty 1

## 2017-12-04 MED ORDER — LOPERAMIDE HCL 2 MG PO CAPS
2.0000 mg | ORAL_CAPSULE | Freq: Once | ORAL | Status: AC
  Administered 2017-12-04: 2 mg via ORAL
  Filled 2017-12-04: qty 1

## 2017-12-04 MED ORDER — SODIUM CHLORIDE 0.9 % IV SOLN: 250.0000 mL | INTRAVENOUS | Status: DC

## 2017-12-04 MED ORDER — HYDROCORTISONE 1 % EX CREA
1.0000 "application " | TOPICAL_CREAM | Freq: Three times a day (TID) | CUTANEOUS | Status: DC | PRN
  Filled 2017-12-04: qty 28

## 2017-12-04 MED ORDER — SODIUM CHLORIDE 0.9% FLUSH
3.0000 mL | Freq: Two times a day (BID) | INTRAVENOUS | Status: DC
  Administered 2017-12-04: 3 mL via INTRAVENOUS

## 2017-12-04 NOTE — Progress Notes (Signed)
Inpatient Diabetes Program Recommendations  AACE/ADA: New Consensus Statement on Inpatient Glycemic Control (2015)  Target Ranges:  Prepandial:   less than 140 mg/dL      Peak postprandial:   less than 180 mg/dL (1-2 hours)      Critically ill patients:  140 - 180 mg/dL   Lab Results  Component Value Date   GLUCAP 231 (H) 12/04/2017   HGBA1C 7.8 (H) 12/01/2017    Review of Glycemic Control Results for Ermalene PostinHARRELSON, Charls O (MRN [id number]) as of 12/04/2017 09:59  Ref. Range 12/03/2017 07:43 12/03/2017 12:22 12/03/2017 15:57 12/03/2017 22:30 12/04/2017 08:15  Glucose-Capillary Latest Ref Range: 70 - 99 mg/dL 94 409139 (H) 811294 (H) 914329 (H) 231 (H)   Diabetes history: DM2 Outpatient Diabetes medications: Glipizide 5 mg BID Current orders for Inpatient glycemic control: Novolog 0-15 units TID with meals, Novolog 0-5 units QHS; Novolog 3 units TID, Prednisone 30 mg QAM   Inpatient Diabetes Program Recommendations:   Noted that Prednisone has recently been tapered to 30 mg QAM.  Consider restarting basal: Levemir 8 units QHS.  May want to add the meal coverage under the glycemic control order set to ensure patient consumes >50% of meal and that BS >80mg /dL.   Thanks, Lujean RaveLauren Avannah Decker, MSN, RNC-OB Diabetes Coordinator [phone] (8a-5p)

## 2017-12-04 NOTE — H&P (View-Only) (Signed)
Progress Note  Patient Name: Timothy Cordova Date of Encounter: 12/04/2017  Primary Cardiologist: Julien Nordmannimothy [name], MD   Subjective   No particular problems overnight.  Denies chest pain.  Does have dyspnea with activity.  Inpatient Medications    Scheduled Meds: . amoxicillin-clavulanate  1 tablet Oral BID  . apixaban  2.5 mg Oral BID  . arformoterol  15 mcg Nebulization BID  . aspirin EC  81 mg Oral Daily  . budesonide (PULMICORT) nebulizer solution  0.5 mg Nebulization BID  . doxazosin  4 mg Oral QHS  . furosemide  20 mg Oral Daily  . guaiFENesin  600 mg Oral BID  . insulin aspart  0-15 Units Subcutaneous TID WC  . insulin aspart  0-5 Units Subcutaneous QHS  . insulin aspart  3 Units Subcutaneous TID WC  . metoprolol  100 mg Oral Daily  . pantoprazole  40 mg Oral Daily  . polyethylene glycol  17 g Oral Daily  . polyvinyl alcohol  1 drop Both Eyes BID  . [START ON 12/05/2017] predniSONE  30 mg Oral Q breakfast  . rosuvastatin  40 mg Oral Daily  . senna-docusate  1 tablet Oral BID   Continuous Infusions: . diltiazem (CARDIZEM) infusion 5 mg/hr (11/29/17 1639)   PRN Meds: antiseptic oral rinse, levalbuterol   Vital Signs    Vitals:   12/03/17 2132 12/03/17 2313 12/04/17 0500 12/04/17 0745  BP: (!) 149/73 (!) 166/71  (!) 119/50  Pulse: 75 74  75  Resp:  18  (!) 24  Temp:  97.6 F (36.4 C)  97.6 F (36.4 C)  TempSrc:  Oral  Oral  SpO2:  100%  98%  Weight:   83.3 kg   Height:        Intake/Output Summary (Last 24 hours) at 12/04/2017 0857 Last data filed at 12/04/2017 0454 Gross per 24 hour  Intake 480 ml  Output -  Net 480 ml   Filed Weights   12/02/17 0630 12/03/17 0500 12/04/17 0500  Weight: 82 kg 83.4 kg 83.3 kg    Telemetry    Atrial flutter with VR into the 120"s with activity. - Personally Reviewed  ECG    No new tracing. - Personally Reviewed  Physical Exam  Comfortable and in no distress. GEN: No acute distress.   Neck: No  JVD Cardiac:  Irregularly irregular rate Respiratory: Clear to auscultation bilaterally. GI: Soft, nontender, non-distended  MS: No edema; No deformity. Neuro:  Nonfocal  Psych: Normal affect   Labs    Chemistry Recent Labs  Lab 11/30/17 0255  12/02/17 0254 12/03/17 0237 12/04/17 0307  NA 137   < > 138 138 136  K 4.6   < > 4.6 4.4 4.5  CL 101   < > 102 102 103  CO2 28   < > 27 28 26   GLUCOSE 237*  244*   < > 232* 100* 269*  BUN 63*   < > 67* 57* 52*  CREATININE 1.88*   < > 1.67* 1.46* 1.58*  CALCIUM 8.8*   < > 8.8* 8.8* 8.8*  PROT 5.9*  --   --   --   --   ALBUMIN 3.0*  --   --   --   --   AST 26  --   --   --   --   ALT 15  --   --   --   --   ALKPHOS 52  --   --   --   --  BILITOT 0.4  --   --   --   --   GFRNONAA 31*   < > 35* 41* 38*  GFRAA 35*   < > 41* 48* 44*  ANIONGAP 8   < > 9 8 7    < > = values in this interval not displayed.     Hematology Recent Labs  Lab 12/02/17 0254 12/03/17 0237 12/04/17 0307  WBC 8.6 11.4* 12.5*  RBC 3.77* 3.81* 4.12*  HGB 11.7* 11.8* 12.8*  HCT 37.0* 37.1* 39.8  MCV 98.1 97.4 96.6  MCH 31.0 31.0 31.1  MCHC 31.6 31.8 32.2  RDW 11.7 11.7 11.7  PLT 162 167 176    Cardiac Enzymes Recent Labs  Lab 11/28/17 1347 11/28/17 2052 11/29/17 0018  TROPONINI <0.03 <0.03 <0.03    Recent Labs  Lab 11/28/17 0757  TROPIPOC 0.01     BNP Recent Labs  Lab 11/28/17 0748  BNP 315.0*     DDimer No results for input(s): DDIMER in the last 168 hours.   Radiology    No results found.  Cardiac Studies   ECHOCARDIOGRAPHY 11/30/2017: Study Conclusions  - Left ventricle: The cavity size was normal. There was mild   concentric hypertrophy. Systolic function was normal. The   estimated ejection fraction was in the range of 60% to 65%. Wall   motion was normal; there were no regional wall motion   abnormalities. - Aortic valve: There was trivial regurgitation. - Right atrium: The atrium was mildly dilated. - Pulmonary  arteries: Systolic pressure was mildly increased. PA   peak pressure: 38 mm Hg (S).  Patient Profile     84 y.o. male with history of CAD (CABG 2006, patent graft by cath 2010 in 2015), chronic diastolic heart failure, severe COPD, essential hypertension, CKD stage III, diabetes mellitus, PAD with previous left subclavian artery stent, mild dementia, presenting with new onset atrial flutter with rapid ventricular response. Left ventricular systolic function remains normal by echo. No complaint of angina. Rtae control has been poor.  Assessment & Plan    1. Atrial flutter with rapid ventricular response with activity.  Slight improvement in rate control.  Plan transesophageal guided electrical cardioversion 12/05/2017:  2. Chronic anticoagulation - will need long-term anticoagulation therapy 3. Chronic diastolic heart failure: Stable volume status 4. CKD stage III: Slowly improving.  TEE guided electrical cardioversion 12/05/2017.  Procedure and risks discussed in detail with the patient.     For questions or updates, please contact [facility] Please consult [email or web address] for contact info under Cardiology/STEMI.      Signed, Lesleigh Noe, MD  12/04/2017, 8:57 AM

## 2017-12-04 NOTE — Progress Notes (Signed)
Physical Therapy Treatment Patient Details Name: Timothy Cordova MRN: [id number] DOB: [birth year] Today's Date: 12/04/2017    History of Present Illness [name] is an 84yo M who presented to the ED with SOB. Pt was admitted with sepsis dx. PMH includes HTN, diastolic CHF, CKD stage III, COPD.     PT Comments    Focus of today's session energy conservation and being able to ambulate longer distances without becoming overly SoB. Pt able to ambulate at a slower rate and keep SaO2 on RA >90%O2 and only required 1x seated rest break. D/c plans continue to remain appropriate at this time. PT will continue to follow acutely.    Follow Up Recommendations  Home health PT;Supervision - Intermittent     Equipment Recommendations  None recommended by PT       Precautions / Restrictions Precautions Precautions: Fall Restrictions Weight Bearing Restrictions: No    Mobility  Bed Mobility Overal bed mobility: Modified Independent                Transfers Overall transfer level: Modified independent                  Ambulation/Gait Ambulation/Gait assistance: Supervision Gait Distance (Feet): 500 Feet Assistive device: None Gait Pattern/deviations: Step-through pattern;Shuffle;Trunk flexed Gait velocity: slowed Gait velocity interpretation: <1.8 ft/sec, indicate of risk for recurrent falls General Gait Details: supervision for ambulation, requires 1x sitting rest break for 3/4 DoE       Balance Overall balance assessment: Needs assistance   Sitting balance-Leahy Scale: Normal     Standing balance support: No upper extremity supported;During functional activity Standing balance-Leahy Scale: Good                              Cognition Arousal/Alertness: Awake/alert Behavior During Therapy: WFL for tasks assessed/performed Overall Cognitive Status: Within Functional Limits for tasks assessed                                            General Comments General comments (skin integrity, edema, etc.): SaO2 on RA >90%O2 throughout session, max HR with ambulation 100 bpm      Pertinent Vitals/Pain Pain Assessment: No/denies pain           PT Goals (current goals can now be found in the care plan section) Acute Rehab PT Goals Patient Stated Goal: "to go back home with my wife" PT Goal Formulation: With patient/family Time For Goal Achievement: 12/14/17 Potential to Achieve Goals: Good Progress towards PT goals: Progressing toward goals    Frequency    Min 3X/week      PT Plan Current plan remains appropriate       AM-PAC PT "6 Clicks" Daily Activity  Outcome Measure  Difficulty turning over in bed (including adjusting bedclothes, sheets and blankets)?: None Difficulty moving from lying on back to sitting on the side of the bed? : None Difficulty sitting down on and standing up from a chair with arms (e.g., wheelchair, bedside commode, etc,.)?: None Help needed moving to and from a bed to chair (including a wheelchair)?: None Help needed walking in hospital room?: None Help needed climbing 3-5 steps with a railing? : A Little 6 Click Score: 23    End of Session Equipment Utilized During Treatment: Gait belt Activity Tolerance: Patient limited by fatigue  Patient left: in chair;with call bell/phone within reach;with chair alarm set;with family/visitor present Nurse Communication: Mobility status PT Visit Diagnosis: Other abnormalities of gait and mobility (R26.89);History of falling (Z91.81);Muscle weakness (generalized) (M62.81);Difficulty in walking, not elsewhere classified (R26.2)     Time: 6644-0347 PT Time Calculation (min) (ACUTE ONLY): 18 min  Charges:  $Gait Training: 8-22 mins                     Oyindamola Key B. Beverely Risen PT, DPT Acute Rehabilitation  [phone] Pager [phone]     Elon Alas Fleet 12/04/2017, 2:58 PM

## 2017-12-04 NOTE — Care Management Note (Addendum)
Case Management Note  Patient Details  Name: Timothy Cordova MRN: [id number] Date of Birth: November 11, 1928  Subjective/Objective:  From home with wife, presents with sob, palpitations, for cardioversion tomorrow, NCM offered choice for HHPT, [facility], [facility].  Soc will begin 24-48 hrs post dc.  NCM informed him of estimate cost for eliquis of 294.46 for 30 day supply. Wife states this is high for them.  NCM awaiting benefit check on xarelto also.                  Action/Plan: DC home when ready.   Expected Discharge Date:  12/01/17               Expected Discharge Plan:  Home w Home Health Services  In-House Referral:     Discharge planning Services  CM Consult  Post Acute Care Choice:  Home Health Choice offered to:     DME Arranged:    DME Agency:     HH Arranged:  PT HH Agency:  [facility]  Status of Service:  Completed, signed off  If discussed at [employer] Meetings, dates discussed:    Additional Comments:  Leone Havenaylor, Enrrique Mierzwa Clinton, RN 12/04/2017, 12:34 PM

## 2017-12-04 NOTE — Progress Notes (Signed)
 Progress Note  Patient Name: Timothy Cordova Date of Encounter: 12/04/2017  Primary Cardiologist: Timothy [name], MD   Subjective   No particular problems overnight.  Denies chest pain.  Does have dyspnea with activity.  Inpatient Medications    Scheduled Meds: . amoxicillin-clavulanate  1 tablet Oral BID  . apixaban  2.5 mg Oral BID  . arformoterol  15 mcg Nebulization BID  . aspirin EC  81 mg Oral Daily  . budesonide (PULMICORT) nebulizer solution  0.5 mg Nebulization BID  . doxazosin  4 mg Oral QHS  . furosemide  20 mg Oral Daily  . guaiFENesin  600 mg Oral BID  . insulin aspart  0-15 Units Subcutaneous TID WC  . insulin aspart  0-5 Units Subcutaneous QHS  . insulin aspart  3 Units Subcutaneous TID WC  . metoprolol  100 mg Oral Daily  . pantoprazole  40 mg Oral Daily  . polyethylene glycol  17 g Oral Daily  . polyvinyl alcohol  1 drop Both Eyes BID  . [START ON 12/05/2017] predniSONE  30 mg Oral Q breakfast  . rosuvastatin  40 mg Oral Daily  . senna-docusate  1 tablet Oral BID   Continuous Infusions: . diltiazem (CARDIZEM) infusion 5 mg/hr (11/29/17 1639)   PRN Meds: antiseptic oral rinse, levalbuterol   Vital Signs    Vitals:   12/03/17 2132 12/03/17 2313 12/04/17 0500 12/04/17 0745  BP: (!) 149/73 (!) 166/71  (!) 119/50  Pulse: 75 74  75  Resp:  18  (!) 24  Temp:  97.6 F (36.4 C)  97.6 F (36.4 C)  TempSrc:  Oral  Oral  SpO2:  100%  98%  Weight:   83.3 kg   Height:        Intake/Output Summary (Last 24 hours) at 12/04/2017 0857 Last data filed at 12/04/2017 0454 Gross per 24 hour  Intake 480 ml  Output -  Net 480 ml   Filed Weights   12/02/17 0630 12/03/17 0500 12/04/17 0500  Weight: 82 kg 83.4 kg 83.3 kg    Telemetry    Atrial flutter with VR into the 120"s with activity. - Personally Reviewed  ECG    No new tracing. - Personally Reviewed  Physical Exam  Comfortable and in no distress. GEN: No acute distress.   Neck: No  JVD Cardiac:  Irregularly irregular rate Respiratory: Clear to auscultation bilaterally. GI: Soft, nontender, non-distended  MS: No edema; No deformity. Neuro:  Nonfocal  Psych: Normal affect   Labs    Chemistry Recent Labs  Lab 11/30/17 0255  12/02/17 0254 12/03/17 0237 12/04/17 0307  NA 137   < > 138 138 136  K 4.6   < > 4.6 4.4 4.5  CL 101   < > 102 102 103  CO2 28   < > 27 28 26  GLUCOSE 237*  244*   < > 232* 100* 269*  BUN 63*   < > 67* 57* 52*  CREATININE 1.88*   < > 1.67* 1.46* 1.58*  CALCIUM 8.8*   < > 8.8* 8.8* 8.8*  PROT 5.9*  --   --   --   --   ALBUMIN 3.0*  --   --   --   --   AST 26  --   --   --   --   ALT 15  --   --   --   --   ALKPHOS 52  --   --   --   --     BILITOT 0.4  --   --   --   --   GFRNONAA 31*   < > 35* 41* 38*  GFRAA 35*   < > 41* 48* 44*  ANIONGAP 8   < > 9 8 7   < > = values in this interval not displayed.     Hematology Recent Labs  Lab 12/02/17 0254 12/03/17 0237 12/04/17 0307  WBC 8.6 11.4* 12.5*  RBC 3.77* 3.81* 4.12*  HGB 11.7* 11.8* 12.8*  HCT 37.0* 37.1* 39.8  MCV 98.1 97.4 96.6  MCH 31.0 31.0 31.1  MCHC 31.6 31.8 32.2  RDW 11.7 11.7 11.7  PLT 162 167 176    Cardiac Enzymes Recent Labs  Lab 11/28/17 1347 11/28/17 2052 11/29/17 0018  TROPONINI <0.03 <0.03 <0.03    Recent Labs  Lab 11/28/17 0757  TROPIPOC 0.01     BNP Recent Labs  Lab 11/28/17 0748  BNP 315.0*     DDimer No results for input(s): DDIMER in the last 168 hours.   Radiology    No results found.  Cardiac Studies   ECHOCARDIOGRAPHY 11/30/2017: Study Conclusions  - Left ventricle: The cavity size was normal. There was mild   concentric hypertrophy. Systolic function was normal. The   estimated ejection fraction was in the range of 60% to 65%. Wall   motion was normal; there were no regional wall motion   abnormalities. - Aortic valve: There was trivial regurgitation. - Right atrium: The atrium was mildly dilated. - Pulmonary  arteries: Systolic pressure was mildly increased. PA   peak pressure: 38 mm Hg (S).  Patient Profile     84 y.o. male with history of CAD (CABG 2006, patent graft by cath 2010 in 2015), chronic diastolic heart failure, severe COPD, essential hypertension, CKD stage III, diabetes mellitus, PAD with previous left subclavian artery stent, mild dementia, presenting with new onset atrial flutter with rapid ventricular response. Left ventricular systolic function remains normal by echo. No complaint of angina. Rtae control has been poor.  Assessment & Plan    1. Atrial flutter with rapid ventricular response with activity.  Slight improvement in rate control.  Plan transesophageal guided electrical cardioversion 12/05/2017:  2. Chronic anticoagulation - will need long-term anticoagulation therapy 3. Chronic diastolic heart failure: Stable volume status 4. CKD stage III: Slowly improving.  TEE guided electrical cardioversion 12/05/2017.  Procedure and risks discussed in detail with the patient.     For questions or updates, please contact [facility] Please consult [email or web address] for contact info under Cardiology/STEMI.      Signed, Henry W Smith III, MD  12/04/2017, 8:57 AM    

## 2017-12-04 NOTE — Progress Notes (Signed)
PROGRESS NOTE  Timothy Cordova ZDG:[id number] DOB: Dec 09, 1928 DOA: 11/28/2017 PCP: Hannah Beat, MD  Brief Summary:  pmh of dCHF, CADs/p CABG, COPD; who presents with SOB and Cough w/ fever. Found in new onset Afib/aflutter. Cardiology consulted.  Improving, cardioversion on 8/28, then d/c home if cleared by cardiology     HPI/Recap of past 24 hours:  Feeling better, remain in aflutter, rate improving with occasional tachycardia remain DOE with minimal activity,    Wheezing has largely resolved, trace lower extremity edema no cough, no fever, denies chest pain    Assessment/Plan: Principal Problem:   Sepsis (HCC) Active Problems:   DM (diabetes mellitus), type 2 with renal complications (HCC)   Essential hypertension   CAD, AUTOLOGOUS BYPASS GRAFT   DIASTOLIC HEART FAILURE, CHRONIC   BPH (benign prostatic hyperplasia)   COPD with acute exacerbation (HCC)   Atrial fibrillation and flutter (HCC)   Typical atrial flutter (HCC)   COPD exacerbation/bronchitis/early pna/sepsis/acute on chronic hypoxic respiratory failure: -Patient presents with temperature 100.4 F, tachycardia, and tachypnea. WBC was elevated at 13.7 --initial cxr no acute infiltrate, repeat cxr with trace pleural fluids, chronic cardiomegaly, copd. -troponin negative -urine strep pneumo and legionella  antigen negative, mrsa screening negative, sputum culture in process, blood culture no growth, respiratory viral panel negative -initial procalcitonin 0.42, procalcitonin trending down -continue to improve,  D/c iv steroids, change to prednisone and continue taper, finished abx treatment with augmentin/oral zithro , last dose on 8/27, continue nebs/mucinex -wean oxygen as tolerated (baseline not oxygen dependent), he will need home o2 ( he reports checking his o2 sats at home, it goes down to low 80's when he walks for short distance, this has been going on for a year)  New onset aflutter/RVR -heart  rate not well controlled by betablocker alone initially,  initially required cardizem drip, heart rate improving, now off cardizem drip -he is started on heparin drip on admission, now transitioned to apixaban per cardiology -echo with preserved LVEF, troponin negative - cardioversion planned on 8/28, will follow cardiology recommendation, -appreciate cardiology input  chronic diastolic chf -he does not appear significant volume overloaded, will continue home oral dose of lasix -close monitor volume status -cardiology following  CAD s/p CABG -denies chest pain, troponin negative -continue on betablocker, asa, statin -cardiology following  AKI on CKDIII -cr on admission is 1.89 (baseline cr 1.5) -ua unremarkable -cr fluctuating , repeat bmp, renal dosing meds  nonsinuslin dependent dm2 a1c in 07/2017 was 7.7%, repeat a1c 7.8 Home meds glipizide held since admission Wean steroids,  decrease insulin (currently on  meal coverage and ssi)   BPH - Continue doxazosin  Constipation: resolved with stool softener  Code Status: full  Family Communication: patient   Disposition Plan: home with home health/home o2  in 1-2 days , needs cardiology clearance   Consultants:  cardiology  Procedures:  Cardioversion planned on 8/28  Antibiotics:  As above    Objective: BP (!) 166/71 (BP Location: Right Arm)   Pulse 74   Temp 97.6 F (36.4 C) (Oral)   Resp 18   Ht 5\' 6"  (1.676 m)   Wt 83.3 kg   SpO2 100%   BMI 29.64 kg/m   Intake/Output Summary (Last 24 hours) at 12/04/2017 0812 Last data filed at 12/04/2017 0454 Gross per 24 hour  Intake 480 ml  Output -  Net 480 ml   Filed Weights   12/02/17 0630 12/03/17 0500 12/04/17 0500  Weight: 82 kg 83.4 kg 83.3  kg    Exam: Patient is examined daily including today on 12/04/2017, exams remain the same as of yesterday except that has changed    General:  Frail, but NAD  Cardiovascular: in aflutter, rate  controlled  Respiratory: diminished, wheezing has almost resolved  Abdomen: Soft/ND/NT, positive BS  Musculoskeletal: trace bilateral lower extremity edema  Neuro: alert, oriented   Data Reviewed: Basic Metabolic Panel: Recent Labs  Lab 11/28/17 0748 11/30/17 0255 12/01/17 0259 12/02/17 0254 12/03/17 0237 12/04/17 0307  NA 139 137 139 138 138 136  K 5.1 4.6 4.3 4.6 4.4 4.5  CL 102 101 103 102 102 103  CO2 23 28 27 27 28 26   GLUCOSE 254* 237*  244* 264* 232* 100* 269*  BUN 32* 63* 71* 67* 57* 52*  CREATININE 1.89* 1.88* 1.74* 1.67* 1.46* 1.58*  CALCIUM 9.7 8.8* 8.9 8.8* 8.8* 8.8*  MG 1.8 2.3  --   --   --   --    Liver Function Tests: Recent Labs  Lab 11/30/17 0255  AST 26  ALT 15  ALKPHOS 52  BILITOT 0.4  PROT 5.9*  ALBUMIN 3.0*   No results for input(s): LIPASE, AMYLASE in the last 168 hours. No results for input(s): AMMONIA in the last 168 hours. CBC: Recent Labs  Lab 11/30/17 0255 12/01/17 0259 12/02/17 0254 12/03/17 0237 12/04/17 0307  WBC 8.2 8.4 8.6 11.4* 12.5*  HGB 11.1* 11.4* 11.7* 11.8* 12.8*  HCT 35.8* 36.2* 37.0* 37.1* 39.8  MCV 98.9 98.9 98.1 97.4 96.6  PLT 161 169 162 167 176   Cardiac Enzymes:   Recent Labs  Lab 11/28/17 1347 11/28/17 2052 11/29/17 0018  TROPONINI <0.03 <0.03 <0.03   BNP (last 3 results) Recent Labs    07/30/17 1644 11/28/17 0748  BNP 127.4* 315.0*    ProBNP (last 3 results) No results for input(s): PROBNP in the last 8760 hours.  CBG: Recent Labs  Lab 12/02/17 2039 12/03/17 0743 12/03/17 1222 12/03/17 1557 12/03/17 2230  GLUCAP 208* 94 139* 294* 329*    Recent Results (from the past 240 hour(s))  Urine culture     Status: Abnormal   Collection Time: 11/28/17  8:58 AM  Result Value Ref Range Status   Specimen Description URINE, RANDOM  Final   Special Requests NONE  Final   Culture (A)  Final    <10,000 COLONIES/mL INSIGNIFICANT GROWTH Performed at [facility], [address]  [place], [place] [address]    Report Status 11/29/2017 FINAL  Final  Gram stain     Status: None   Collection Time: 11/28/17  2:18 PM  Result Value Ref Range Status   Specimen Description TRACHEAL SITE  Final   Special Requests NONE  Final   Gram Stain   Final    ABUNDANT WBC PRESENT, PREDOMINANTLY PMN MODERATE GRAM POSITIVE COCCI MODERATE GRAM VARIABLE ROD RARE YEAST ABUNDANT SQUAMOUS EPITHELIAL CELLS PRESENT Performed at [facility], [address] [place], [place] [address]    Report Status 11/29/2017 FINAL  Final  Respiratory Panel by PCR     Status: None   Collection Time: 11/28/17  2:19 PM  Result Value Ref Range Status   Adenovirus NOT DETECTED NOT DETECTED Final   Coronavirus 229E NOT DETECTED NOT DETECTED Final   Coronavirus HKU1 NOT DETECTED NOT DETECTED Final   Coronavirus NL63 NOT DETECTED NOT DETECTED Final   Coronavirus OC43 NOT DETECTED NOT DETECTED Final   Metapneumovirus NOT DETECTED NOT DETECTED Final  Rhinovirus / Enterovirus NOT DETECTED NOT DETECTED Final   Influenza A NOT DETECTED NOT DETECTED Final   Influenza B NOT DETECTED NOT DETECTED Final   Parainfluenza Virus 1 NOT DETECTED NOT DETECTED Final   Parainfluenza Virus 2 NOT DETECTED NOT DETECTED Final   Parainfluenza Virus 3 NOT DETECTED NOT DETECTED Final   Parainfluenza Virus 4 NOT DETECTED NOT DETECTED Final   Respiratory Syncytial Virus NOT DETECTED NOT DETECTED Final   Bordetella pertussis NOT DETECTED NOT DETECTED Final   Chlamydophila pneumoniae NOT DETECTED NOT DETECTED Final   Mycoplasma pneumoniae NOT DETECTED NOT DETECTED Final    Comment: Performed at [facility], [address], [place], [place] [address]  Blood culture (routine x 2)     Status: None   Collection Time: 11/28/17  2:40 PM  Result Value Ref Range Status   Specimen Description BLOOD RIGHT ANTECUBITAL  Final   Special Requests   Final    BOTTLES DRAWN AEROBIC AND ANAEROBIC Blood Culture adequate volume   Culture    Final    NO GROWTH 5 DAYS Performed at [facility], [address], [place], [place] [address]    Report Status 12/03/2017 FINAL  Final  Blood culture (routine x 2)     Status: None   Collection Time: 11/28/17  4:03 PM  Result Value Ref Range Status   Specimen Description BLOOD LEFT ANTECUBITAL  Final   Special Requests   Final    BOTTLES DRAWN AEROBIC AND ANAEROBIC Blood Culture adequate volume   Culture   Final    NO GROWTH 5 DAYS Performed at [facility], [address] [place], [place] [address]    Report Status 12/03/2017 FINAL  Final  MRSA PCR Screening     Status: None   Collection Time: 11/29/17  9:14 PM  Result Value Ref Range Status   MRSA by PCR NEGATIVE NEGATIVE Final    Comment:        The GeneXpert MRSA Assay (FDA approved for NASAL specimens only), is one component of a comprehensive MRSA colonization surveillance program. It is not intended to diagnose MRSA infection nor to guide or monitor treatment for MRSA infections. Performed at [facility], [address], [place], [place] [address]      Studies: No results found.  Scheduled Meds: . amoxicillin-clavulanate  1 tablet Oral BID  . apixaban  2.5 mg Oral BID  . arformoterol  15 mcg Nebulization BID  . aspirin EC  81 mg Oral Daily  . budesonide (PULMICORT) nebulizer solution  0.5 mg Nebulization BID  . doxazosin  4 mg Oral QHS  . furosemide  20 mg Oral Daily  . guaiFENesin  600 mg Oral BID  . insulin aspart  0-15 Units Subcutaneous TID WC  . insulin aspart  0-5 Units Subcutaneous QHS  . insulin aspart  3 Units Subcutaneous TID WC  . metoprolol  100 mg Oral Daily  . pantoprazole  40 mg Oral Daily  . polyethylene glycol  17 g Oral Daily  . polyvinyl alcohol  1 drop Both Eyes BID  . predniSONE  40 mg Oral Q breakfast  . rosuvastatin  40 mg Oral Daily  . senna-docusate  1 tablet Oral BID    Continuous Infusions: . diltiazem (CARDIZEM) infusion 5 mg/hr (11/29/17 1639)      Time spent: 25 mins,  I have personally reviewed and interpreted on  12/04/2017 daily labs, tele strips, imagings as discussed above  under date review session and assessment and plans.  I reviewed all nursing notes, pharmacy notes, consultant notes,  vitals, pertinent old records  I have discussed plan of care as described above with RN , patient  on 12/04/2017   Albertine Grates MD, PhD  [facility] Pager [phone]. If 7PM-7AM, please contact night-coverage at [email or web address], password [facility] 12/04/2017, 8:12 AM  LOS: 6 days

## 2017-12-04 NOTE — Anesthesia Preprocedure Evaluation (Addendum)
Anesthesia Evaluation  Patient identified by MRN, date of birth, ID band Patient awake    Reviewed: Allergy & Precautions, NPO status , Patient's Chart, lab work & pertinent test results  Airway Mallampati: I  TM Distance: >3 FB Neck ROM: Full    Dental no notable dental hx. (+) Lower Dentures, Upper Dentures   Pulmonary sleep apnea , COPD, former smoker,    Pulmonary exam normal breath sounds clear to auscultation       Cardiovascular hypertension, Pt. on medications + CAD and + Peripheral Vascular Disease  Normal cardiovascular exam+ dysrhythmias Atrial Fibrillation  Rhythm:Regular Rate:Normal  11/30/17 ECHO Left ventricle: The cavity size was normal. There was mild   concentric hypertrophy. Systolic function was normal. The   estimated ejection fraction was in the range of 60% to 65%. Wall   motion was normal; there were no regional wall motion   abnormalities. - Aortic valve: There was trivial regurgitation. - Right atrium: The atrium was mildly dilated. - Pulmonary arteries: Systolic pressure was mildly increased. PA   peak pressure: 38 mm Hg (S).   Neuro/Psych PSYCHIATRIC DISORDERS Dementia    GI/Hepatic Neg liver ROS,   Endo/Other  diabetes  Renal/GU Renal disease     Musculoskeletal   Abdominal   Peds  Hematology  (+) anemia ,   Anesthesia Other Findings   Reproductive/Obstetrics                            Anesthesia Physical Anesthesia Plan  ASA: III  Anesthesia Plan: General   Post-op Pain Management:    Induction: Intravenous  PONV Risk Score and Plan:   Airway Management Planned: Natural Airway, Mask and Nasal Cannula  Additional Equipment:   Intra-op Plan:   Post-operative Plan:   Informed Consent: I have reviewed the patients History and Physical, chart, labs and discussed the procedure including the risks, benefits and alternatives for the proposed  anesthesia with the patient or authorized representative who has indicated his/her understanding and acceptance.   Dental advisory given  Plan Discussed with: CRNA  Anesthesia Plan Comments:         Anesthesia Quick Evaluation

## 2017-12-04 NOTE — Care Management (Signed)
12-04-17  BENEFITS CHECK :  #  2.  S/W  [facility] [place] # [phone]  1. XARELTO   15 MG BID COVER- YES CO-PAY-$ 294.46 TIER- 3 DRUG PRIOR APPROVAL-NO  2. XARELTO 20 MG DAILY COVER- YES CO-PAY- $ 294.46 TIER- 3 DRUG PRIOR APPROVAL- NO  ** DEDUCTIBLE: NOT MET **  PREFERRED  PHARMACY : YES [facility], [facility]    [facility]- NONE FORMULARY

## 2017-12-05 ENCOUNTER — Inpatient Hospital Stay (HOSPITAL_COMMUNITY): Payer: Medicare PPO

## 2017-12-05 ENCOUNTER — Encounter (HOSPITAL_COMMUNITY): Payer: Self-pay | Admitting: Certified Registered Nurse Anesthetist

## 2017-12-05 ENCOUNTER — Inpatient Hospital Stay (HOSPITAL_COMMUNITY): Payer: Medicare PPO | Admitting: Anesthesiology

## 2017-12-05 ENCOUNTER — Encounter (HOSPITAL_COMMUNITY)
Admission: EM | Disposition: A | Discharge status: Home Health | Payer: Self-pay | Source: Home / Self Care | Attending: Internal Medicine

## 2017-12-05 DIAGNOSIS — I361 Nonrheumatic tricuspid (valve) insufficiency: Secondary | ICD-10-CM

## 2017-12-05 DIAGNOSIS — I4892 Unspecified atrial flutter: Secondary | ICD-10-CM

## 2017-12-05 HISTORY — PX: TEE WITHOUT CARDIOVERSION: SHX5443

## 2017-12-05 HISTORY — PX: CARDIOVERSION: SHX1299

## 2017-12-05 LAB — BASIC METABOLIC PANEL
Anion gap: 5 (ref 5–15)
BUN: 51 mg/dL — AB (ref 8–23)
CO2: 31 mmol/L (ref 22–32)
CREATININE: 1.59 mg/dL — AB (ref 0.61–1.24)
Calcium: 8.8 mg/dL — ABNORMAL LOW (ref 8.9–10.3)
Chloride: 104 mmol/L (ref 98–111)
GFR, EST AFRICAN AMERICAN: 43 mL/min — AB (ref >60)
GFR, EST NON AFRICAN AMERICAN: 37 mL/min — AB (ref >60)
Glucose, Bld: 196 mg/dL — ABNORMAL HIGH (ref 70–99)
Potassium: 4.4 mmol/L (ref 3.5–5.1)
SODIUM: 140 mmol/L (ref 135–145)

## 2017-12-05 LAB — CBC
HCT: 38.2 % — ABNORMAL LOW (ref 39.0–52.0)
HEMOGLOBIN: 12.2 g/dL — AB (ref 13.0–17.0)
MCH: 30.8 pg (ref 26.0–34.0)
MCHC: 31.9 g/dL (ref 30.0–36.0)
MCV: 96.5 fL (ref 78.0–100.0)
Platelets: 205 10*3/uL (ref 150–400)
RBC: 3.96 MIL/uL — ABNORMAL LOW (ref 4.22–5.81)
RDW: 11.9 % (ref 11.5–15.5)
WBC: 13.2 10*3/uL — AB (ref 4.0–10.5)

## 2017-12-05 LAB — GLUCOSE, CAPILLARY
Glucose-Capillary: 175 mg/dL — ABNORMAL HIGH (ref 70–99)
Glucose-Capillary: 193 mg/dL — ABNORMAL HIGH (ref 70–99)

## 2017-12-05 LAB — MAGNESIUM: MAGNESIUM: 2.3 mg/dL (ref 1.7–2.4)

## 2017-12-05 SURGERY — CARDIOVERSION
Anesthesia: General

## 2017-12-05 MED ORDER — EPHEDRINE SULFATE 50 MG/ML IJ SOLN
INTRAMUSCULAR | Status: DC | PRN
  Administered 2017-12-05: 5 mg via INTRAVENOUS

## 2017-12-05 MED ORDER — POLYETHYLENE GLYCOL 3350 17 G PO PACK: 17.0000 g | PACK | Freq: Every day | ORAL | 0 refills | Status: DC

## 2017-12-05 MED ORDER — PHENYLEPHRINE HCL 10 MG/ML IJ SOLN
INTRAMUSCULAR | Status: DC | PRN
  Administered 2017-12-05: 80 ug via INTRAVENOUS

## 2017-12-05 MED ORDER — LACTATED RINGERS IV SOLN
INTRAVENOUS | Status: DC | PRN
  Administered 2017-12-05: 09:00:00 via INTRAVENOUS

## 2017-12-05 MED ORDER — PROPOFOL 10 MG/ML IV BOLUS
INTRAVENOUS | Status: DC | PRN
  Administered 2017-12-05: 15 mg via INTRAVENOUS
  Administered 2017-12-05: 20 mg via INTRAVENOUS

## 2017-12-05 MED ORDER — PROPOFOL 500 MG/50ML IV EMUL
INTRAVENOUS | Status: DC | PRN
  Administered 2017-12-05: 100 ug/kg/min via INTRAVENOUS

## 2017-12-05 MED ORDER — PREDNISONE 10 MG PO TABS: 10.0000 mg | ORAL_TABLET | Freq: Every day | ORAL | 0 refills | Status: DC

## 2017-12-05 MED ORDER — APIXABAN 2.5 MG PO TABS: 2.5000 mg | ORAL_TABLET | Freq: Two times a day (BID) | ORAL | 3 refills | Status: DC

## 2017-12-05 NOTE — Discharge Summary (Signed)
Physician Discharge Summary  Timothy Cordova ZOX:[id number] DOB: 11/25/1928 DOA: 11/28/2017  PCP: Hannah Beatopland, Spencer, MD  Admit date: 11/28/2017 Discharge date: 12/05/2017  Time spent: 45 minutes  Recommendations for Outpatient Follow-up:  -To be discharged home today. -Cardiology to arrange outpatient follow-up. -Advised to follow-up with PCP in 2 weeks.  Discharge Diagnoses:  Principal Problem:   Sepsis (HCC) Active Problems:   DM (diabetes mellitus), type 2 with renal complications (HCC)   Essential hypertension   CAD, AUTOLOGOUS BYPASS GRAFT   DIASTOLIC HEART FAILURE, CHRONIC   BPH (benign prostatic hyperplasia)   COPD with acute exacerbation (HCC)   Atrial fibrillation and flutter (HCC)   Typical atrial flutter (HCC)   Discharge Condition: Stable and improved  Filed Weights   12/03/17 0500 12/04/17 0500 12/05/17 0217  Weight: 83.4 kg 83.3 kg 83.1 kg    History of present illness:  As per Dr. Katrinka BlazingSmith on 8/21: Timothy Postinhomas O Stallsmith is a 84 y.o. male with medical history significant of HTN, HLD, diastolic CHF last EF 60-65% with 1DD, COPD not on oxygen at baseline, CKD stage III, and remote tobacco use who presents with complaints of shortness of breath over the last 1-2 weeks.  Symptoms have waxed and waned, and until recently.  Patient reports finding himself panting just walking room to room.  He has had a cough, but reports that it is mild and nonproductive.  Reports utilizing his albuterol rescue inhaler up to 4 times daily.  He denies that his weight has been stable at 175 to 178 pounds consistently and reports taking it daily.  He thought maybe his symptoms were due to heart failure and increased his Lasix from 20 mg to 40 mg 2 days ago, but reported no change in symptoms.  Other associated symptoms include some urinary frequency, decreased oral intake, malaise, and chronic back pain.  Denies having any fever, chills, recent sick contacts, chest pain, nausea, vomiting, or  dysuria symptoms.  ED Course: Upon admission to the emergency department patient was found to be febrile up to 100.4 F, heart rates 83-1 28, respiration 21-41, and O2 saturations 91 to 95% after being placed on 2 L of nasal cannula oxygen.  Labs revealed WBC 13.7, BUN 32, creatinine 1.89, BUN 315.0, troponin 0.01, and lactic acid 1.59.  Urinalysis was otherwise noted to be clear and chest x-ray did not show any focal signs of any infiltrate or edema.  Hospital Course:   New onset A. fib letter/A. fib with rapid ventricular response -Initially required IV diltiazem, subsequently transition over to beta-blocker alone. -Has been started on apixaban for anticoagulation purposes given elevated CHADSVASC score. -Had TEE/cardioversion performed on 8/28 that was successful.  Early sepsis and chronic hypoxemic respiratory failure -Due to COPD with acute exacerbation and bronchitis -All sepsis parameters have resolved upon discharge. -All culture data remains negative to date. -Finish antibiotic treatment with azithromycin. -We will discharge home on prednisone taper and nebs.  Chronic diastolic heart failure -Appears compensated, not volume overloaded.  Acute on chronic kidney disease stage III -Creatinine on presentation was 1.89, baseline creatinine 1.5. -Creatinine on discharge is back at baseline at 1.59.  Type 2 diabetes -CBGs have been a little elevated in the high 100s to low 200s, suspect this will continue to improve now that we are weaning steroids. -We will add on sliding scale insulin while hospitalized, have transitioned over to glipizide which is his home medication  Procedures:  None  Consultations:  None  Discharge Instructions  Discharge Instructions    Diet - low sodium heart healthy   Complete by:  As directed    Increase activity slowly   Complete by:  As directed      Allergies as of 12/05/2017      Reactions   Metformin And Related Nausea And Vomiting,  Rash   Tramadol Hcl Other (See Comments)   Tremor vs seizure activity   Codeine Nausea And Vomiting   Gabapentin Other (See Comments)   "Messed up my nervous system"   Morphine Sulfate Nausea And Vomiting   Other Nausea And Vomiting   "Narcotics, in general"   Ticlopidine Hcl Other (See Comments)   Reaction unknown by patient or family   Warfarin Sodium Nausea And Vomiting, Other (See Comments)   "Severe fatigue," also   Ace Inhibitors Rash, Cough   Tramadol Rash      Medication List    TAKE these medications   albuterol 108 (90 Base) MCG/ACT inhaler Commonly known as:  PROVENTIL HFA;VENTOLIN HFA Inhale 2 puffs into the lungs every 6 (six) hours as needed for wheezing or shortness of breath.   apixaban 2.5 MG Tabs tablet Commonly known as:  ELIQUIS Take 1 tablet (2.5 mg total) by mouth 2 (two) times daily.   aspirin EC 81 MG tablet Take 81 mg by mouth daily.   Carboxymethylcellulose Sod PF 0.25 % Soln Place 2 drops into both eyes 2 (two) times daily.   diphenhydramine-acetaminophen 25-500 MG Tabs tablet Commonly known as:  TYLENOL PM Take 1 tablet by mouth at bedtime as needed (for sleep).   doxazosin 8 MG tablet Commonly known as:  CARDURA Take 4 mg by mouth at bedtime.   furosemide 20 MG tablet Commonly known as:  LASIX Take 20 mg by mouth daily.   glipiZIDE 5 MG tablet Commonly known as:  GLUCOTROL Take 1 tablet (5 mg total) by mouth 2 (two) times daily before a meal.   metoprolol 200 MG 24 hr tablet Commonly known as:  TOPROL-XL Take 100 mg by mouth daily.   ONE TOUCH ULTRA TEST test strip Generic drug:  glucose blood USE TO CHECK BLOOD SUGAR 2 TIMES DAILY AS DIRECTED   ONETOUCH DELICA LANCETS 33G Misc   polyethylene glycol packet Commonly known as:  MIRALAX / GLYCOLAX Take 17 g by mouth daily.   predniSONE 10 MG tablet Commonly known as:  DELTASONE Take 1 tablet (10 mg total) by mouth daily with breakfast. Take 6 tablets today and then decrease  by 1 tablet daily until none are left.   PRESERVISION AREDS 2 Caps Take 1 capsule by mouth daily with breakfast.   CENTRUM SILVER 50+MEN PO Take 1 tablet by mouth daily.   rosuvastatin 40 MG tablet Commonly known as:  CRESTOR Take 1 tablet (40 mg total) by mouth daily.   STIOLTO RESPIMAT 2.5-2.5 MCG/ACT Aers Generic drug:  Tiotropium Bromide-Olodaterol Inhale 2 puffs into the lungs daily.      Allergies  Allergen Reactions  . Metformin And Related Nausea And Vomiting and Rash  . Tramadol Hcl Other (See Comments)    Tremor vs seizure activity  . Codeine Nausea And Vomiting  . Gabapentin Other (See Comments)    "Messed up my nervous system"  . Morphine Sulfate Nausea And Vomiting  . Other Nausea And Vomiting    "Narcotics, in general"  . Ticlopidine Hcl Other (See Comments)    Reaction unknown by patient or family  . Warfarin Sodium Nausea And Vomiting and Other (See  Comments)    "Severe fatigue," also  . Ace Inhibitors Rash and Cough  . Tramadol Rash   Follow-up Information    Copland, Spencer, MD. Schedule an appointment as soon as possible for a visit in 2 week(s).   Specialty:  Family Medicine Contact information: [address] [place] [address] [phone]        Antonieta Iba, MD .   Specialty:  Cardiology Contact information: [address] [place] [address] [phone]            The results of significant diagnostics from this hospitalization (including imaging, microbiology, ancillary and laboratory) are listed below for reference.    Significant Diagnostic Studies: Dg Chest 2 View  Result Date: 11/30/2017 CLINICAL DATA:  Pneumonia EXAM: CHEST - 2 VIEW COMPARISON:  Two days ago FINDINGS: Chronic cardiomegaly. Status post CABG. Chronic hyperinflation. Trace pleural effusions in the posterior costophrenic sulci. No Kerley lines for edema. No air bronchogram. Subclavian stent on the left. No acute osseous  finding IMPRESSION: 1. Trace pleural fluid. 2. Chronic cardiomegaly. 3. COPD. Electronically Signed   By: Marnee Spring M.D.   On: 11/30/2017 09:21   Dg Chest 2 View  Result Date: 11/28/2017 CLINICAL DATA:  Shortness of breath EXAM: CHEST - 2 VIEW COMPARISON:  09/17/2017 FINDINGS: Prior CABG. Heart is borderline in size. No confluent airspace opacities, effusions or edema. No acute bony abnormality. IMPRESSION: Prior CABG.  Borderline heart size.  No active disease. Electronically Signed   By: Charlett Nose M.D.   On: 11/28/2017 08:40    Microbiology: Recent Results (from the past 240 hour(s))  Urine culture     Status: Abnormal   Collection Time: 11/28/17  8:58 AM  Result Value Ref Range Status   Specimen Description URINE, RANDOM  Final   Special Requests NONE  Final   Culture (A)  Final    <10,000 COLONIES/mL INSIGNIFICANT GROWTH Performed at [facility], [address] [place], [place] [address]    Report Status 11/29/2017 FINAL  Final  Gram stain     Status: None   Collection Time: 11/28/17  2:18 PM  Result Value Ref Range Status   Specimen Description TRACHEAL SITE  Final   Special Requests NONE  Final   Gram Stain   Final    ABUNDANT WBC PRESENT, PREDOMINANTLY PMN MODERATE GRAM POSITIVE COCCI MODERATE GRAM VARIABLE ROD RARE YEAST ABUNDANT SQUAMOUS EPITHELIAL CELLS PRESENT Performed at [facility], [address] [place], [place] [address]    Report Status 11/29/2017 FINAL  Final  Respiratory Panel by PCR     Status: None   Collection Time: 11/28/17  2:19 PM  Result Value Ref Range Status   Adenovirus NOT DETECTED NOT DETECTED Final   Coronavirus 229E NOT DETECTED NOT DETECTED Final   Coronavirus HKU1 NOT DETECTED NOT DETECTED Final   Coronavirus NL63 NOT DETECTED NOT DETECTED Final   Coronavirus OC43 NOT DETECTED NOT DETECTED Final   Metapneumovirus NOT DETECTED NOT DETECTED Final   Rhinovirus / Enterovirus NOT DETECTED NOT DETECTED Final   Influenza  A NOT DETECTED NOT DETECTED Final   Influenza B NOT DETECTED NOT DETECTED Final   Parainfluenza Virus 1 NOT DETECTED NOT DETECTED Final   Parainfluenza Virus 2 NOT DETECTED NOT DETECTED Final   Parainfluenza Virus 3 NOT DETECTED NOT DETECTED Final   Parainfluenza Virus 4 NOT DETECTED NOT DETECTED Final   Respiratory Syncytial Virus NOT DETECTED NOT DETECTED  Final   Bordetella pertussis NOT DETECTED NOT DETECTED Final   Chlamydophila pneumoniae NOT DETECTED NOT DETECTED Final   Mycoplasma pneumoniae NOT DETECTED NOT DETECTED Final    Comment: Performed at [facility], [address], [place], [place] [address]  Blood culture (routine x 2)     Status: None   Collection Time: 11/28/17  2:40 PM  Result Value Ref Range Status   Specimen Description BLOOD RIGHT ANTECUBITAL  Final   Special Requests   Final    BOTTLES DRAWN AEROBIC AND ANAEROBIC Blood Culture adequate volume   Culture   Final    NO GROWTH 5 DAYS Performed at [facility], [address], [place], [place] [address]    Report Status 12/03/2017 FINAL  Final  Blood culture (routine x 2)     Status: None   Collection Time: 11/28/17  4:03 PM  Result Value Ref Range Status   Specimen Description BLOOD LEFT ANTECUBITAL  Final   Special Requests   Final    BOTTLES DRAWN AEROBIC AND ANAEROBIC Blood Culture adequate volume   Culture   Final    NO GROWTH 5 DAYS Performed at [facility], [address], [place], [place] [address]    Report Status 12/03/2017 FINAL  Final  MRSA PCR Screening     Status: None   Collection Time: 11/29/17  9:14 PM  Result Value Ref Range Status   MRSA by PCR NEGATIVE NEGATIVE Final    Comment:        The GeneXpert MRSA Assay (FDA approved for NASAL specimens only), is one component of a comprehensive MRSA colonization surveillance program. It is not intended to diagnose MRSA infection nor to guide or monitor treatment for MRSA infections. Performed at [facility]  Lab, [address], [place], [place] [address]      Labs: Basic Metabolic Panel: Recent Labs  Lab 11/30/17 0255  12/02/17 0254 12/03/17 0237 12/04/17 0307 12/04/17 1622 12/05/17 0251  NA 137   < > 138 138 136 137 140  K 4.6   < > 4.6 4.4 4.5 5.1 4.4  CL 101   < > 102 102 103 102 104  CO2 28   < > 27 28 26 28 31   GLUCOSE 237*  244*   < > 232* 100* 269* 338* 196*  BUN 63*   < > 67* 57* 52* 56* 51*  CREATININE 1.88*   < > 1.67* 1.46* 1.58* 1.76* 1.59*  CALCIUM 8.8*   < > 8.8* 8.8* 8.8* 8.9 8.8*  MG 2.3  --   --   --   --   --  2.3   < > = values in this interval not displayed.   Liver Function Tests: Recent Labs  Lab 11/30/17 0255  AST 26  ALT 15  ALKPHOS 52  BILITOT 0.4  PROT 5.9*  ALBUMIN 3.0*   No results for input(s): LIPASE, AMYLASE in the last 168 hours. No results for input(s): AMMONIA in the last 168 hours. CBC: Recent Labs  Lab 12/01/17 0259 12/02/17 0254 12/03/17 0237 12/04/17 0307 12/05/17 0251  WBC 8.4 8.6 11.4* 12.5* 13.2*  HGB 11.4* 11.7* 11.8* 12.8* 12.2*  HCT 36.2* 37.0* 37.1* 39.8 38.2*  MCV 98.9 98.1 97.4 96.6 96.5  PLT 169 162 167 176 205   Cardiac Enzymes: Recent Labs  Lab 11/28/17 2052 11/29/17 0018  TROPONINI <0.03 <0.03   BNP: BNP (last 3 results) Recent Labs  07/30/17 1644 11/28/17 0748  BNP 127.4* 315.0*    ProBNP (last 3 results) No results for input(s): PROBNP in the last 8760 hours.  CBG: Recent Labs  Lab 12/04/17 1153 12/04/17 1634 12/04/17 2145 12/05/17 0744 12/05/17 1202  GLUCAP 267* 348* 270* 175* 193*       Signed:  Chaya Jan  [facility] Pager: [phone] 12/05/2017, 2:15 PM

## 2017-12-05 NOTE — CV Procedure (Signed)
    Transesophageal Echocardiogram Note  Ermalene Postinhomas O Raygoza [id number] Jun 26, 1928  Procedure: Transesophageal Echocardiogram Indications: Atrial flutter   Procedure Details Consent: Obtained Time Out: Verified patient identification, verified procedure, site/side was marked, verified correct patient position, special equipment/implants available, Radiology Safety Procedures followed,  medications/allergies/relevent history reviewed, required imaging and test results available.  Performed  Medications:  During this procedure the patient is administered a  Propofol bolus and drip by Sherrilyn RistKari, CRNA.   Total of 60 mg for TEE and cardioversion   Left Ventrical:  Normal LV function   Mitral Valve: mild MR    Aortic Valve:  Trivial AI  Tricuspid Valve: mild - mod TR   Pulmonic Valve: trivial PI   Left Atrium/ Left atrial appendage: no thrombi   Atrial septum: aneurismal ( very mobile ) no PFO or ASD by color doppler   Aorta: mild calcified plaque    Complications: No apparent complications Patient did tolerate procedure well.      Cardioversion Note  Ermalene Postinhomas O Maguire [id number] Jun 26, 1928  Procedure: DC Cardioversion Indications:   Procedure Details Consent: Obtained Time Out: Verified patient identification, verified procedure, site/side was marked, verified correct patient position, special equipment/implants available, Radiology Safety Procedures followed,  medications/allergies/relevent history reviewed, required imaging and test results available.  Performed  The patient has been on adequate anticoagulation.  The patient received IV Propofol 60 mg IV  for sedation.  Synchronous cardioversion was performed at 50, 200  joules.  The cardioversion was successful.     Complications: No apparent complications Patient did tolerate procedure well.   Vesta MixerPhilip J. Alyla Pietila, Montez HagemanJr., MD, [facility] ValleyFACC 12/05/2017, 9:26 AM

## 2017-12-05 NOTE — Progress Notes (Signed)
Inpatient Diabetes Program Recommendations  AACE/ADA: New Consensus Statement on Inpatient Glycemic Control (2019)  Target Ranges:  Prepandial:   less than 140 mg/dL      Peak postprandial:   less than 180 mg/dL (1-2 hours)      Critically ill patients:  140 - 180 mg/dL  Results for Timothy Cordova, Timothy Cordova (MRN [id number]) as of 12/05/2017 07:26  Ref. Range 12/05/2017 02:51  Glucose Latest Ref Range: 70 - 99 mg/dL 147196 (H)   Results for Timothy Cordova, Timothy Cordova (MRN [id number]) as of 12/05/2017 07:26  Ref. Range 12/04/2017 08:15 12/04/2017 11:53 12/04/2017 16:34 12/04/2017 21:45  Glucose-Capillary Latest Ref Range: 70 - 99 mg/dL 865231 (H) 784267 (H) 696348 (H) 270 (H)    Review of Glycemic Control  Diabetes history: DM2 Outpatient Diabetes medications: Glipizide 5 mg BID Current orders for Inpatient glycemic control: Novolog 0-15 units TID with meals, Novolog 0-5 units QHS, Novolog 3 units TID with meals for meal coverage  Inpatient Diabetes Program Recommendations: Insulin - Basal: Please consider ordering Lantus 8 units Q24H (start now). Insulin - Meal Coverage: If steroids are continued, please consider increasing meal coverage to Novolog 5 units TID with meals if patient eats at least 50% of meals.  Thanks, Orlando PennerMarie Mishael Krysiak, RN, MSN, CDE Diabetes Coordinator Inpatient Diabetes Program [phone] (Team Pager from 8am to 5pm)

## 2017-12-05 NOTE — Progress Notes (Signed)
Pt and wife given discharge instructions with understanding. Pt has no questions at this time. Monitor and iv d/c.

## 2017-12-05 NOTE — Interval H&P Note (Signed)
History and Physical Interval Note:  12/05/2017 8:03 AM  Timothy Cordova  has presented today for surgery, with the diagnosis of afib  The various methods of treatment have been discussed with the patient and family. After consideration of risks, benefits and other options for treatment, the patient has consented to  Procedure(s): CARDIOVERSION (N/A) TRANSESOPHAGEAL ECHOCARDIOGRAM (TEE) (N/A) as a surgical intervention .  The patient's history has been reviewed, patient examined, no change in status, stable for surgery.  I have reviewed the patient's chart and labs.  Questions were answered to the patient's satisfaction.     Kristeen MissPhilip Vladimir Lenhoff

## 2017-12-05 NOTE — Interval H&P Note (Signed)
History and Physical Interval Note:  12/05/2017 8:40 AM  Timothy Cordova  has presented today for surgery, with the diagnosis of afib  The various methods of treatment have been discussed with the patient and family. After consideration of risks, benefits and other options for treatment, the patient has consented to  Procedure(s): CARDIOVERSION (N/A) TRANSESOPHAGEAL ECHOCARDIOGRAM (TEE) (N/A) as a surgical intervention .  The patient's history has been reviewed, patient examined, no change in status, stable for surgery.  I have reviewed the patient's chart and labs.  Questions were answered to the patient's satisfaction.     Kristeen MissPhilip Nahser

## 2017-12-05 NOTE — Transfer of Care (Signed)
Immediate Anesthesia Transfer of Care Note  Patient: Timothy Cordova  Procedure(s) Performed: CARDIOVERSION (N/A ) TRANSESOPHAGEAL ECHOCARDIOGRAM (TEE) (N/A )  Patient Location: PACU and Endoscopy Unit  Anesthesia Type:MAC  Level of Consciousness: awake and drowsy  Airway & Oxygen Therapy: Patient Spontanous Breathing  Post-op Assessment: Report given to RN and Post -op Vital signs reviewed and stable  Post vital signs: Reviewed and stable  Last Vitals:  Vitals Value Taken Time  BP    Temp    Pulse    Resp    SpO2      Last Pain:  Vitals:   12/05/17 0804  TempSrc: Oral  PainSc: 0-No pain         Complications: No apparent anesthesia complications

## 2017-12-05 NOTE — Progress Notes (Signed)
  Echocardiogram Echocardiogram Transesophageal has been performed.  Timothy Cordova T Timothy Cordova 12/05/2017, 9:43 AM

## 2017-12-05 NOTE — Plan of Care (Signed)
  Problem: Education: Goal: Knowledge of General Education information will improve Description Including pain rating scale, medication(s)/side effects and non-pharmacologic comfort measures Outcome: Completed/Met   Problem: Clinical Measurements: Goal: Ability to maintain clinical measurements within normal limits will improve Outcome: Completed/Met Goal: Will remain free from infection Outcome: Completed/Met Goal: Diagnostic test results will improve Outcome: Completed/Met Goal: Respiratory complications will improve Outcome: Completed/Met Goal: Cardiovascular complication will be avoided Outcome: Completed/Met   Problem: Activity: Goal: Risk for activity intolerance will decrease Outcome: Completed/Met   Problem: Nutrition: Goal: Adequate nutrition will be maintained Outcome: Completed/Met   Problem: Coping: Goal: Level of anxiety will decrease Outcome: Completed/Met   Problem: Elimination: Goal: Will not experience complications related to bowel motility Outcome: Completed/Met Goal: Will not experience complications related to urinary retention Outcome: Completed/Met   Problem: Safety: Goal: Ability to remain free from injury will improve Outcome: Completed/Met   Problem: Skin Integrity: Goal: Risk for impaired skin integrity will decrease Outcome: Completed/Met

## 2017-12-05 NOTE — Anesthesia Postprocedure Evaluation (Signed)
Anesthesia Post Note  Patient: Timothy Cordova  Procedure(s) Performed: CARDIOVERSION (N/A ) TRANSESOPHAGEAL ECHOCARDIOGRAM (TEE) (N/A )     Patient location during evaluation: PACU Anesthesia Type: General Level of consciousness: awake and alert Pain management: pain level controlled Vital Signs Assessment: post-procedure vital signs reviewed and stable Respiratory status: spontaneous breathing, nonlabored ventilation, respiratory function stable and patient connected to nasal cannula oxygen Cardiovascular status: blood pressure returned to baseline and stable Postop Assessment: no apparent nausea or vomiting Anesthetic complications: no    Last Vitals:  Vitals:   12/05/17 0955 12/05/17 1036  BP: (!) 139/35   Pulse: 87 98  Resp: (!) 32 (!) 22  Temp:    SpO2: 95% 94%    Last Pain:  Vitals:   12/05/17 1030  TempSrc:   PainSc: 0-No pain                 Trevor IhaStephen A Mishawn Didion

## 2017-12-05 NOTE — Care Management (Addendum)
12:00 Patient provided with 30 day Eliquis card. Verbalized understanding of use 15:00 [facility].

## 2017-12-06 ENCOUNTER — Telehealth: Payer: Self-pay | Admitting: *Deleted

## 2017-12-06 ENCOUNTER — Telehealth: Payer: Self-pay

## 2017-12-06 ENCOUNTER — Encounter (HOSPITAL_COMMUNITY): Payer: Self-pay | Admitting: Cardiovascular Disease

## 2017-12-06 NOTE — Telephone Encounter (Signed)
agree

## 2017-12-06 NOTE — Telephone Encounter (Signed)
Transition Care Management Follow-up Telephone Call   Date discharged? 12/05/2017   How have you been since you were released from the hospital? "just fine"   Do you understand why you were in the hospital? yes   Do you understand the discharge instructions? yes   Where were you discharged to? home   Items Reviewed:  Medications reviewed: yes  Allergies reviewed: yes  Dietary changes reviewed: yes Referrals reviewed: yes  Functional Questionnaire:   Activities of Daily Living (ADLs):   He states they are independent in the following: independent in all areas    Any transportation issues/concerns?: no   Any patient concerns? no   Confirmed importance and date/time of follow-up visits scheduled yes  Provider Appointment booked with Dr Patsy Lageropland 9/4  Confirmed with patient if condition begins to worsen call PCP or go to the ER.  Patient was given the office number and encouraged to call back with question or concerns.  : yes

## 2017-12-06 NOTE — Telephone Encounter (Signed)
Copied from CRM [phone]#[id number]. Topic: General - Other >> Dec 06, 2017 10:23 AM Timothy Cordova, Timothy Cordova wrote: Reason for CRM:  Timothy HughsKecia [facility] [phone] wanted the provider to know that they will be going to the patient's home tomorrow to start Home Health Nursing

## 2017-12-12 ENCOUNTER — Ambulatory Visit: Payer: Medicare PPO | Admitting: Family Medicine

## 2017-12-12 ENCOUNTER — Encounter: Payer: Self-pay | Admitting: Family Medicine

## 2017-12-12 VITALS — BP 110/58 | HR 65 | Temp 97.8°F | Ht 66.0 in | Wt 178.8 lb

## 2017-12-12 DIAGNOSIS — J9601 Acute respiratory failure with hypoxia: Secondary | ICD-10-CM | POA: Diagnosis not present

## 2017-12-12 DIAGNOSIS — J441 Chronic obstructive pulmonary disease with (acute) exacerbation: Secondary | ICD-10-CM

## 2017-12-12 DIAGNOSIS — I5032 Chronic diastolic (congestive) heart failure: Secondary | ICD-10-CM

## 2017-12-12 DIAGNOSIS — I483 Typical atrial flutter: Secondary | ICD-10-CM

## 2017-12-12 DIAGNOSIS — Z23 Encounter for immunization: Secondary | ICD-10-CM | POA: Diagnosis not present

## 2017-12-12 LAB — BASIC METABOLIC PANEL
BUN: 32 mg/dL — ABNORMAL HIGH (ref 6–23)
CALCIUM: 9.9 mg/dL (ref 8.4–10.5)
CHLORIDE: 98 meq/L (ref 96–112)
CO2: 37 meq/L — AB (ref 19–32)
CREATININE: 1.63 mg/dL — AB (ref 0.40–1.50)
GFR: 42.69 mL/min — ABNORMAL LOW (ref >60.00)
GLUCOSE: 125 mg/dL — AB (ref 70–99)
Potassium: 4.5 mEq/L (ref 3.5–5.1)
Sodium: 140 mEq/L (ref 135–145)

## 2017-12-12 LAB — CBC WITH DIFFERENTIAL/PLATELET
BASOS ABS: 0.1 10*3/uL (ref 0.0–0.1)
BASOS PCT: 0.5 % (ref 0.0–3.0)
EOS ABS: 0.1 10*3/uL (ref 0.0–0.7)
Eosinophils Relative: 1 % (ref 0.0–5.0)
HEMATOCRIT: 40.1 % (ref 39.0–52.0)
Hemoglobin: 13.3 g/dL (ref 13.0–17.0)
LYMPHS ABS: 1.5 10*3/uL (ref 0.7–4.0)
Lymphocytes Relative: 14.3 % (ref 12.0–46.0)
MCHC: 33.1 g/dL (ref 30.0–36.0)
MCV: 93.1 fl (ref 78.0–100.0)
Monocytes Absolute: 1.1 10*3/uL — ABNORMAL HIGH (ref 0.1–1.0)
Monocytes Relative: 10.4 % (ref 3.0–12.0)
NEUTROS ABS: 7.8 10*3/uL — AB (ref 1.4–7.7)
NEUTROS PCT: 73.8 % (ref 43.0–77.0)
PLATELETS: 208 10*3/uL (ref 150.0–400.0)
RBC: 4.3 Mil/uL (ref 4.22–5.81)
RDW: 12.8 % (ref 11.5–15.5)
WBC: 10.6 10*3/uL — ABNORMAL HIGH (ref 4.0–10.5)

## 2017-12-12 LAB — HEPATIC FUNCTION PANEL
ALBUMIN: 3.6 g/dL (ref 3.5–5.2)
ALK PHOS: 47 U/L (ref 39–117)
ALT: 21 U/L (ref 0–53)
AST: 16 U/L (ref 0–37)
BILIRUBIN DIRECT: 0.1 mg/dL (ref 0.0–0.3)
TOTAL PROTEIN: 6.2 g/dL (ref 6.0–8.3)
Total Bilirubin: 0.6 mg/dL (ref 0.2–1.2)

## 2017-12-12 NOTE — Progress Notes (Signed)
Dr. Karleen Hampshire T. Patriciann Becht, MD, CAQ Sports Medicine Primary [facility] and Sports Medicine [address] [place], [address] Phone: [phone] Fax: [phone]  12/12/2017  Patient: Timothy Cordova, MRN: [id number], DOB: 1929-02-07, 84 y.o.  Primary Physician:  Hannah Beat, MD   Chief Complaint  Patient presents with  . Hospitalization Follow-up   Subjective:   Timothy Cordova is a 84 y.o. very pleasant male patient who presents with the following:  Hospital follow-up:  Admit date: 11/28/2017 Discharge date: 12/05/2017  Sepsis, finish zithromax.  When he was admitted to the hospital he did have a temperature of 100.4 F, with a heart rate it was up well into the 100s.  He also had increased respirations with a max and the 40s, and a pulse ox from 91 to 95% and required 2 L of oxygen.  He did have a white count of 13.7.  He was given steroids and Zithromax.  New onset AF, IV dilt, started on Apixaban, 8/28 TEE and cardioversion.  He did have RVR in the hospital, which required IV diltiazem and was changed to beta-blocker.  He was also placed on apixaban for anticoagulation, and he was cardioverted successfully on August 28.  Chronic kidney disease, he did have a elevation in his baseline creatinine to 1.89 increased from a baseline of approximately 1.5-1.6.  This had improved by the time of discharge.  COPD exacerbation  12/06/2017 Transition care call reviewed.  Past Medical History, Surgical History, Social History, Family History, Problem List, Medications, and Allergies have been reviewed and updated if relevant.  Patient Active Problem List   Diagnosis Date Noted  . CKD (chronic kidney disease) stage 3, GFR 30-59 ml/min (HCC) 08/18/2010    Priority: High  . DIASTOLIC HEART FAILURE, CHRONIC 08/18/2009    Priority: High  . CAD, AUTOLOGOUS BYPASS GRAFT 06/07/2008    Priority: High  . DM (diabetes mellitus), type 2 with renal complications (HCC) 09/04/2007   Priority: High  . Centrilobular emphysema (HCC) 08/08/2005    Priority: High  . Typical atrial flutter (HCC)   . Atrial fibrillation and flutter (HCC)   . Acute respiratory failure with hypoxia (HCC) 11/28/2017  . Sepsis (HCC) 11/28/2017  . COPD (chronic obstructive pulmonary disease) (HCC) 07/30/2017  . Pulmonary HTN (HCC) 07/30/2017  . Solitary pulmonary nodule 12/10/2015  . Anemia 11/26/2014  . COPD with acute exacerbation (HCC) 05/21/2013  . Aortic calcification, 04/20/2005 CT Chest 04/03/2013  . Sleep apnea   . Tobacco abuse (in remission)   . Chronic low back pain 04/18/2011  . Renal lesion 03/27/2011  . OSTEOARTHRITIS 12/06/2009  . Mixed hyperlipidemia 06/11/2008  . ALLERGIC RHINITIS 01/30/2007  . HEARING LOSS 07/06/2006  . Essential hypertension 07/06/2006  . BPH (benign prostatic hyperplasia) 07/06/2006    Past Medical History:  Diagnosis Date  . Allergic rhinitis   . Benign prostatic hypertrophy   . Chronic diastolic heart failure (HCC)    8/08 ECHO with EF 55%  . CKD (chronic kidney disease)   . COPD (chronic obstructive pulmonary disease) (HCC)    moderate. followed by Dr.Byrum  . Coronary artery disease    s/p CABG 2006. LHC (1/10): SVG-OM and PLOM, SVG-D, and LIMA-LAD were all patent  . Dementia    (mild)--06/2006  . Diabetes mellitus   . Edema    localized. suspect diastolic CHF + venous insufficiency  . Hearing loss    left >>right  . History of tobacco abuse   . Hypertension   .  Mixed hyperlipidemia   . Osteoarthritis   . Pneumonia 07/31/2017  . Sleep apnea    ?    Past Surgical History:  Procedure Laterality Date  . ANGIOPLASTY  [id number]  . CARDIAC CATHETERIZATION  90s   [place], [place] x1stent  . CARDIAC CATHETERIZATION     [facility];x2 stents  . CARDIAC CATHETERIZATION  04/29/2013  . CARDIOVERSION N/A 12/05/2017   Procedure: CARDIOVERSION;  Surgeon: Elease Hashimoto Deloris Ping, MD;  Location: [facility] ENDOSCOPY;  Service: Cardiovascular;  Laterality: N/A;  .  CORONARY ARTERY BYPASS GRAFT  02/2005  . ROTATOR CUFF REPAIR  2005   left, Gioffre  . TEE WITHOUT CARDIOVERSION N/A 12/05/2017   Procedure: TRANSESOPHAGEAL ECHOCARDIOGRAM (TEE);  Surgeon: Elease Hashimoto Deloris Ping, MD;  Location: [facility] ENDOSCOPY;  Service: Cardiovascular;  Laterality: N/A;    Social History   Socioeconomic History  . Marital status: Married    Spouse name: Not on file  . Number of children: Not on file  . Years of education: Not on file  . Highest education level: Not on file  Occupational History  . Occupation: Research officer, trade union: RETIRED    Comment: RETIRED  Social Needs  . Financial resource strain: Not on file  . Food insecurity:    Worry: Not on file    Inability: Not on file  . Transportation needs:    Medical: Not on file    Non-medical: Not on file  Tobacco Use  . Smoking status: Former Smoker    Packs/day: 3.00    Years: 60.00    Pack years: 180.00    Types: Cigarettes    Last attempt to quit: 07/12/2005    Years since quitting: 12.4  . Smokeless tobacco: Never Used  Substance and Sexual Activity  . Alcohol use: No    Alcohol/week: 0.0 standard drinks  . Drug use: No  . Sexual activity: Not on file  Lifestyle  . Physical activity:    Days per week: Not on file    Minutes per session: Not on file  . Stress: Not on file  Relationships  . Social connections:    Talks on phone: Not on file    Gets together: Not on file    Attends religious service: Not on file    Active member of club or organization: Not on file    Attends meetings of clubs or organizations: Not on file    Relationship status: Not on file  . Intimate partner violence:    Fear of current or ex partner: Not on file    Emotionally abused: Not on file    Physically abused: Not on file    Forced sexual activity: Not on file  Other Topics Concern  . Not on file  Social History Narrative  . Not on file    Family History  Problem Relation Age of Onset  . Heart attack  Mother   . Lung cancer Sister   . Prostate cancer Neg Hx   . Kidney cancer Neg Hx     Allergies  Allergen Reactions  . Metformin And Related Nausea And Vomiting and Rash  . Tramadol Hcl Other (See Comments)    Tremor vs seizure activity  . Codeine Nausea And Vomiting  . Gabapentin Other (See Comments)    "Messed up my nervous system"  . Morphine Sulfate Nausea And Vomiting  . Other Nausea And Vomiting    "Narcotics, in general"  . Ticlopidine Hcl Other (See Comments)    Reaction unknown  by patient or family  . Warfarin Sodium Nausea And Vomiting and Other (See Comments)    "Severe fatigue," also  . Ace Inhibitors Rash and Cough  . Tramadol Rash    Medication list reviewed and updated in full in [facility].   GEN: No acute illnesses, no fevers, chills. GI: No n/v/d, eating normally Pulm: No SOB Interactive and getting along well at home.  Otherwise, ROS is as per the HPI.  Objective:   BP (!) 110/58   Pulse 65   Temp 97.8 F (36.6 C) (Oral)   Ht 5\' 6"  (1.676 m)   Wt 178 lb 12 oz (81.1 kg)   SpO2 99%   BMI 28.85 kg/m   GEN: WDWN, NAD, Non-toxic, A & O x 3 HEENT: Atraumatic, Normocephalic. Neck supple. No masses, No LAD. Ears and Nose: No external deformity. CV: RRR, No M/G/R. No JVD. No thrill. No extra heart sounds. PULM: mild wheezing, crackles, rhonchi. No retractions. No resp. distress. No accessory muscle use. EXTR: No c/c/e NEURO Normal gait.  PSYCH: Normally interactive. Conversant. Not depressed or anxious appearing.  Calm demeanor.   Laboratory and Imaging Data: Dg Chest 2 View  Result Date: 11/30/2017 CLINICAL DATA:  Pneumonia EXAM: CHEST - 2 VIEW COMPARISON:  Two days ago FINDINGS: Chronic cardiomegaly. Status post CABG. Chronic hyperinflation. Trace pleural effusions in the posterior costophrenic sulci. No Kerley lines for edema. No air bronchogram. Subclavian stent on the left. No acute osseous finding IMPRESSION: 1. Trace pleural fluid. 2.  Chronic cardiomegaly. 3. COPD. Electronically Signed   By: Marnee Spring M.D.   On: 11/30/2017 09:21   Dg Chest 2 View  Result Date: 11/28/2017 CLINICAL DATA:  Shortness of breath EXAM: CHEST - 2 VIEW COMPARISON:  09/17/2017 FINDINGS: Prior CABG. Heart is borderline in size. No confluent airspace opacities, effusions or edema. No acute bony abnormality. IMPRESSION: Prior CABG.  Borderline heart size.  No active disease. Electronically Signed   By: Charlett Nose M.D.   On: 11/28/2017 08:40    Results for orders placed or performed in visit on 12/12/17  Basic metabolic panel  Result Value Ref Range   Sodium 140 135 - 145 mEq/L   Potassium 4.5 3.5 - 5.1 mEq/L   Chloride 98 96 - 112 mEq/L   CO2 37 (H) 19 - 32 mEq/L   Glucose, Bld 125 (H) 70 - 99 mg/dL   BUN 32 (H) 6 - 23 mg/dL   Creatinine, Ser 3.29 (H) 0.40 - 1.50 mg/dL   Calcium 9.9 8.4 - 19.1 mg/dL   GFR 66.06 (L) >00.45 mL/min  CBC with Differential/Platelet  Result Value Ref Range   WBC 10.6 (H) 4.0 - 10.5 K/uL   RBC 4.30 4.22 - 5.81 Mil/uL   Hemoglobin 13.3 13.0 - 17.0 g/dL   HCT 99.7 74.1 - 42.3 %   MCV 93.1 78.0 - 100.0 fl   MCHC 33.1 30.0 - 36.0 g/dL   RDW 95.3 20.2 - 33.4 %   Platelets 208.0 150.0 - 400.0 K/uL   Neutrophils Relative % 73.8 43.0 - 77.0 %   Lymphocytes Relative 14.3 12.0 - 46.0 %   Monocytes Relative 10.4 3.0 - 12.0 %   Eosinophils Relative 1.0 0.0 - 5.0 %   Basophils Relative 0.5 0.0 - 3.0 %   Neutro Abs 7.8 (H) 1.4 - 7.7 K/uL   Lymphs Abs 1.5 0.7 - 4.0 K/uL   Monocytes Absolute 1.1 (H) 0.1 - 1.0 K/uL   Eosinophils Absolute 0.1  0.0 - 0.7 K/uL   Basophils Absolute 0.1 0.0 - 0.1 K/uL  Hepatic function panel  Result Value Ref Range   Total Bilirubin 0.6 0.2 - 1.2 mg/dL   Bilirubin, Direct 0.1 0.0 - 0.3 mg/dL   Alkaline Phosphatase 47 39 - 117 U/L   AST 16 0 - 37 U/L   ALT 21 0 - 53 U/L   Total Protein 6.2 6.0 - 8.3 g/dL   Albumin 3.6 3.5 - 5.2 g/dL     Assessment and Plan:   Typical atrial  flutter (HCC) - Plan: Basic metabolic panel, CBC with Differential/Platelet, Hepatic function panel  COPD with acute exacerbation (HCC) - Plan: Basic metabolic panel, CBC with Differential/Platelet, Hepatic function panel  Acute respiratory failure with hypoxia (HCC) - Plan: Basic metabolic panel, CBC with Differential/Platelet, Hepatic function panel  DIASTOLIC HEART FAILURE, CHRONIC - Plan: Basic metabolic panel, CBC with Differential/Platelet, Hepatic function panel  Need for prophylactic vaccination and inoculation against influenza - Plan: Flu vaccine HIGH DOSE PF (Fluzone High dose)  Timothy Cordova looks reasonably good today for his 7-day hospitalization.  I do not detect A. fib or a flutter today in the office.  He appears euvolemic on exam.  He does have some mild wheezing, but generally his lungs always have some sort of wheeze or sounds given his chronic COPD and emphysema.  He is compliant with his medications and I reviewed these with him again today.  Given his high risk status, I gave him high-dose flu vaccine today.  Follow-up: No follow-ups on file.  Orders Placed This Encounter  Procedures  . Flu vaccine HIGH DOSE PF (Fluzone High dose)  . Basic metabolic panel  . CBC with Differential/Platelet  . Hepatic function panel    Signed,  Karleen Hampshire T. Nolita Kutter, MD   Allergies as of 12/12/2017      Reactions   Metformin And Related Nausea And Vomiting, Rash   Tramadol Hcl Other (See Comments)   Tremor vs seizure activity   Codeine Nausea And Vomiting   Gabapentin Other (See Comments)   "Messed up my nervous system"   Morphine Sulfate Nausea And Vomiting   Other Nausea And Vomiting   "Narcotics, in general"   Ticlopidine Hcl Other (See Comments)   Reaction unknown by patient or family   Warfarin Sodium Nausea And Vomiting, Other (See Comments)   "Severe fatigue," also   Ace Inhibitors Rash, Cough   Tramadol Rash      Medication List        Accurate as of 12/12/17 11:59  PM. Always use your most recent med list.          albuterol 108 (90 Base) MCG/ACT inhaler Commonly known as:  PROVENTIL HFA;VENTOLIN HFA Inhale 2 puffs into the lungs every 6 (six) hours as needed for wheezing or shortness of breath.   apixaban 2.5 MG Tabs tablet Commonly known as:  ELIQUIS Take 1 tablet (2.5 mg total) by mouth 2 (two) times daily.   aspirin EC 81 MG tablet Take 81 mg by mouth daily.   Carboxymethylcellulose Sod PF 0.25 % Soln Place 2 drops into both eyes 2 (two) times daily.   diphenhydramine-acetaminophen 25-500 MG Tabs tablet Commonly known as:  TYLENOL PM Take 1 tablet by mouth at bedtime as needed (for sleep).   doxazosin 8 MG tablet Commonly known as:  CARDURA Take 4 mg by mouth at bedtime.   furosemide 20 MG tablet Commonly known as:  LASIX Take 20 mg by mouth daily.  glipiZIDE 5 MG tablet Commonly known as:  GLUCOTROL Take 1 tablet (5 mg total) by mouth 2 (two) times daily before a meal.   metoprolol 200 MG 24 hr tablet Commonly known as:  TOPROL-XL Take 100 mg by mouth daily.   ONE TOUCH ULTRA TEST test strip Generic drug:  glucose blood USE TO CHECK BLOOD SUGAR 2 TIMES DAILY AS DIRECTED   ONETOUCH DELICA LANCETS 33G Misc   polyethylene glycol packet Commonly known as:  MIRALAX / GLYCOLAX Take 17 g by mouth daily.   PRESERVISION AREDS 2 Caps Take 1 capsule by mouth daily with breakfast.   CENTRUM SILVER 50+MEN PO Take 1 tablet by mouth daily.   rosuvastatin 40 MG tablet Commonly known as:  CRESTOR Take 1 tablet (40 mg total) by mouth daily.   STIOLTO RESPIMAT 2.5-2.5 MCG/ACT Aers Generic drug:  Tiotropium Bromide-Olodaterol Inhale 2 puffs into the lungs daily.

## 2017-12-18 NOTE — Progress Notes (Signed)
Cardiology Office Note  Date:  12/21/2017   ID:  Timothy Cordova, DOB 1929/02/21, MRN [id number]  PCP:  Hannah Beat, MD   Chief Complaint  Patient presents with  . other    Follow up from [facility]; Echo and TEE with cardioversion. Meds reviewed by the pt. verbally. Pt. c/o shortness of breath and weakness. Pt. stopped the Eliquis on 01/06/2018 due to feeling the urge to urinate but was unable to.     HPI:  84 yo with history of  CAD CORONARY ARTERY DISEASE: s/p CABG 2006.  LHC (1/10): SVG-RCA, SVG-OM and PLOM, SVG-D, and LIMA-LAD were all patent.   diastolic CHF,  severe COPD with 50+ year smoking history ,  diabetes, poorly controlled Mild chronic renal dysfunction likely from overdiuresis chronic shortness of breath.  Catheterization January 2015 showing stable patent bypass grafts including LIMA to the LAD, vein graft to the PDA, vein graft to the OM and SVG to diagonal. Patent left subclavian artery stent. presenting for follow-up of his coronary artery disease, atrial fibrillation  In the hospital August 2019 Hospital records reviewed with the patient in detail New onset atrial fibrillation, flutter, and the setting of COPD exacerbation and respiratory failure TEE and cardioversion which was successful August 28  Went home, had urine retention, Stopped the eliquis thinking that caused the urine problem Only took  about 3 days of blood thinners Urination slowly got better, did not restart eliquis  Creatinine clearance 37 calculated in the office today  Echo 11/30/2017 reviewed with him Left ventricle: The cavity size was normal. There was mild concentric hypertrophy. Systolic function was normal. The estimated ejection fraction was in the range of 60% to 65%. Wall motion was normal; there were no regional wall motion abnormalities. - Pulmonary arteries: PA peak pressure: 38 mm Hg (S).  Reports he was also in the hospital April 2019 with similar COPD symptoms,  pneumonia  Feels weak since discharge from the hospital Reports having follow-up with pulmonary at the [facility] He uses inhalers.  Does not have a nebulizer Denies any leg swelling, PND or orthopnea  EKG personally reviewed by myself on todays visit Shows normal sinus rhythm rate 74 bpm right bundle branch block, left anterior fascicular block  Other past medical history reviewed In the hospital x 2 D/c on 7/10 Admitted to the hospital July 2018 with shortness of breath on exertion for several days, hypoxia hematuria, COPD exacerbation, elevated troponins demand ischemia Ejection fraction 60-65%   d/c from hospital in MAY 2018 PNA ABX x 2, 91 % sats, steroids   hospital admission from January 18 to 04/29/2013 for shortness of breath. renal function and diuretics were held.  started on prednisone, antibiotics with significant improvement of his symptoms.[name] symptoms recurred  Echocardiogram and cardiac catheterization in the hospital suggested normal right ventricular systolic pressures.  Previous anginal symptoms and 2006 included chest pain in his sternal area with profound weakness.    No claudication-type pain in his legs. Weight has been relatively stable    PMH:   has a past medical history of Allergic rhinitis, Benign prostatic hypertrophy, Chronic diastolic heart failure (HCC), CKD (chronic kidney disease), COPD (chronic obstructive pulmonary disease) (HCC), Coronary artery disease, Dementia, Diabetes mellitus, Edema, Hearing loss, History of tobacco abuse, Hypertension, Mixed hyperlipidemia, Osteoarthritis, Pneumonia (07/31/2017), and Sleep apnea.  PSH:    Past Surgical History:  Procedure Laterality Date  . ANGIOPLASTY  [id number]  . CARDIAC CATHETERIZATION  90s   [place], [place] x1stent  .  CARDIAC CATHETERIZATION     [facility];x2 stents  . CARDIAC CATHETERIZATION  04/29/2013  . CARDIOVERSION N/A 12/05/2017   Procedure: CARDIOVERSION;  Surgeon: Elease Hashimoto Deloris Ping, MD;  Location: [facility]  ENDOSCOPY;  Service: Cardiovascular;  Laterality: N/A;  . CORONARY ARTERY BYPASS GRAFT  02/2005  . ROTATOR CUFF REPAIR  2005   left, Gioffre  . TEE WITHOUT CARDIOVERSION N/A 12/05/2017   Procedure: TRANSESOPHAGEAL ECHOCARDIOGRAM (TEE);  Surgeon: Elease Hashimoto Deloris Ping, MD;  Location: [facility] ENDOSCOPY;  Service: Cardiovascular;  Laterality: N/A;    Current Outpatient Medications  Medication Sig Dispense Refill  . albuterol (PROVENTIL HFA;VENTOLIN HFA) 108 (90 Base) MCG/ACT inhaler Inhale 2 puffs into the lungs every 6 (six) hours as needed for wheezing or shortness of breath. 2 Inhaler 0  . Carboxymethylcellulose Sod PF 0.25 % SOLN Place 2 drops into both eyes 2 (two) times daily.     . diphenhydramine-acetaminophen (TYLENOL PM) 25-500 MG TABS tablet Take 1 tablet by mouth at bedtime as needed (for sleep).     [name] doxazosin (CARDURA) 8 MG tablet Take 4 mg by mouth at bedtime.     . furosemide (LASIX) 20 MG tablet Take 20 mg by mouth daily.    [name] glipiZIDE (GLUCOTROL) 5 MG tablet Take 1 tablet (5 mg total) by mouth 2 (two) times daily before a meal. 180 tablet 3  . metoprolol (TOPROL-XL) 200 MG 24 hr tablet Take 100 mg by mouth daily.    . Multiple Vitamins-Minerals (CENTRUM SILVER 50+MEN PO) Take 1 tablet by mouth daily.    . Multiple Vitamins-Minerals (PRESERVISION AREDS 2) CAPS Take 1 capsule by mouth daily with breakfast.     . ONE TOUCH ULTRA TEST test strip USE TO CHECK BLOOD SUGAR 2 TIMES DAILY AS DIRECTED 50 each 11  . ONETOUCH DELICA LANCETS 33G MISC     . rosuvastatin (CRESTOR) 40 MG tablet Take 1 tablet (40 mg total) by mouth daily. 90 tablet 3  . Tiotropium Bromide-Olodaterol (STIOLTO RESPIMAT) 2.5-2.5 MCG/ACT AERS Inhale 2 puffs into the lungs daily.     . Rivaroxaban (XARELTO) 15 MG TABS tablet Take 1 tablet (15 mg total) by mouth daily with supper. 30 tablet 11   No current facility-administered medications for this visit.      Allergies:   Metformin and related; Tramadol hcl; Codeine;  Gabapentin; Morphine sulfate; Other; Ticlopidine hcl; Warfarin sodium; Ace inhibitors; and Tramadol   Social History:  The patient  reports that he quit smoking about 12 years ago. His smoking use included cigarettes. He has a 180.00 pack-year smoking history. He has never used smokeless tobacco. He reports that he does not drink alcohol or use drugs.   Family History:   family history includes Heart attack in his mother; Lung cancer in his sister.    Review of Systems: Review of Systems  Constitutional: Positive for malaise/fatigue.  Respiratory: Positive for shortness of breath.   Cardiovascular: Negative.   Gastrointestinal: Negative.   Musculoskeletal: Negative.   Neurological: Negative.   Psychiatric/Behavioral: Negative.   All other systems reviewed and are negative.    PHYSICAL EXAM: VS:  BP 124/60 (BP Location: Left Arm, Patient Position: Sitting, Cuff Size: Normal)   Pulse 74   Ht 5\' 6"  (1.676 m)   Wt 175 lb 8 oz (79.6 kg)   BMI 28.33 kg/m  , BMI Body mass index is 28.33 kg/m.  Constitutional:  oriented to person, place, and time. No distress.  HENT:  Head: Normocephalic and atraumatic.  Eyes:  no discharge. No scleral icterus.  Neck: Normal range of motion. Neck supple. No JVD present.  Cardiovascular: Normal rate, regular rhythm, normal heart sounds and intact distal pulses. Exam reveals no gallop and no friction rub. No edema No murmur heard. Pulmonary/Chest: Moderately decreased breath sounds throughout no stridor. No respiratory distress.   Abdominal: Soft.  no distension.  no tenderness.  Musculoskeletal: Normal range of motion.  no  tenderness or deformity.  Neurological:  normal muscle tone. Coordination normal. No atrophy Skin: Skin is warm and dry. No rash noted. not diaphoretic.  Psychiatric:  normal mood and affect. behavior is normal. Thought content normal.    Recent Labs: 11/28/2017: B Natriuretic Peptide 315.0; TSH 0.453 12/05/2017: Magnesium  2.3 12/12/2017: ALT 21; BUN 32; Creatinine, Ser 1.63; Hemoglobin 13.3; Platelets 208.0; Potassium 4.5; Sodium 140    Lipid Panel Lab Results  Component Value Date   CHOL 133 09/10/2017   HDL 30.60 (L) 09/10/2017   LDLCALC 68 09/10/2017   TRIG 172.0 (H) 09/10/2017      Wt Readings from Last 3 Encounters:  12/21/17 175 lb 8 oz (79.6 kg)  12/12/17 178 lb 12 oz (81.1 kg)  12/05/17 183 lb 3.2 oz (83.1 kg)       ASSESSMENT AND PLAN:  Atherosclerosis of autologous vein coronary artery bypass graft with stable angina pectoris (HCC) - Plan: EKG 12-Lead Stable, denies any anginal symptoms On metoprolol and statin We will hold the aspirin to start xarelto  Persistent atrial fibrillation Strongly recommended he start anticoagulation given recent cardioversion He stopped eliquis on his own thinking it was causing urinary retention after leaving the hospital.  We will start xarelto 15 mg daily  Mixed hyperlipidemia - Plan: EKG 12-Lead Cholesterol is at goal on the current lipid regimen. No changes to the medications were made.  Stable  Essential hypertension - Plan: EKG 12-Lead Blood pressure is well controlled on today's visit. No changes made to the medications. Stable  Type 2 diabetes mellitus with diabetic nephropathy, without long-term current use of insulin (HCC) - Plan: EKG 12-Lead Recent weight loss secondary to multiple hospitalizations Hemoglobin A1c 7.8  DIASTOLIC HEART FAILURE, CHRONIC - Plan: EKG 12-Lead Stable on torsemide Appears euvolemic  Centrilobular emphysema (HCC) - Plan: EKG 12-Lead Severe COPD, strongly recommended he reestablish with pulmonary Several hospitalizations every year for pneumonia and COPD exacerbation  CKD (chronic kidney disease) stage 3, GFR 30-59 ml/min - Plan: EKG 12-Lead Creatinine 1.4, will avoid higher dose toward my  Acute respiratory failure with hypoxia (HCC) - Plan: EKG 12-Lead Recent hospitalizations reviewed in detail To this  year  Disposition:   F/U  6 months   Total encounter time more than 45 minutes  Greater than 50% was spent in counseling and coordination of care with the patient    Orders Placed This Encounter  Procedures  . EKG 12-Lead     Signed, Dossie Arbour, M.D., Ph.D. 12/21/2017  [facility] [place], [place] [phone]

## 2017-12-21 ENCOUNTER — Ambulatory Visit: Payer: Medicare PPO | Admitting: Cardiovascular Disease

## 2017-12-21 ENCOUNTER — Encounter: Payer: Self-pay | Admitting: Cardiovascular Disease

## 2017-12-21 VITALS — BP 124/60 | HR 74 | Ht 66.0 in | Wt 175.5 lb

## 2017-12-21 DIAGNOSIS — N183 Chronic kidney disease, stage 3 unspecified: Secondary | ICD-10-CM

## 2017-12-21 DIAGNOSIS — J432 Centrilobular emphysema: Secondary | ICD-10-CM

## 2017-12-21 DIAGNOSIS — E1121 Type 2 diabetes mellitus with diabetic nephropathy: Secondary | ICD-10-CM | POA: Diagnosis not present

## 2017-12-21 DIAGNOSIS — I5032 Chronic diastolic (congestive) heart failure: Secondary | ICD-10-CM | POA: Diagnosis not present

## 2017-12-21 DIAGNOSIS — E782 Mixed hyperlipidemia: Secondary | ICD-10-CM | POA: Diagnosis not present

## 2017-12-21 DIAGNOSIS — I10 Essential (primary) hypertension: Secondary | ICD-10-CM | POA: Diagnosis not present

## 2017-12-21 DIAGNOSIS — I25718 Atherosclerosis of autologous vein coronary artery bypass graft(s) with other forms of angina pectoris: Secondary | ICD-10-CM | POA: Diagnosis not present

## 2017-12-21 MED ORDER — RIVAROXABAN 15 MG PO TABS: 15.0000 mg | ORAL_TABLET | Freq: Every day | ORAL | 11 refills | Status: DC

## 2017-12-21 NOTE — Patient Instructions (Addendum)
Medication Instructions:   Please start xarelto 15 mg daily for atrial fibrillation  Stop the aspirin  Labwork:  No new labs needed  Testing/Procedures:  No further testing at this time   Follow-Up: It was a pleasure seeing you in the office today. Please call us if you have new issues that need to be addressed before your next appt.  [phone]  Your physician wants you to follow-up in: 6 months.  You will receive a reminder letter in the mail two months in advance. If you don't receive a letter, please call our office to schedule the follow-up appointment.  If you need a refill on your cardiac medications before your next appointment, please call your pharmacy.  For educational health videos Log in to : [email or web address] Or : [email or web address], password : [email or web address]

## 2018-03-20 ENCOUNTER — Encounter: Payer: Self-pay | Admitting: Family Medicine

## 2018-03-20 ENCOUNTER — Ambulatory Visit: Payer: Medicare PPO | Admitting: Family Medicine

## 2018-03-20 VITALS — BP 120/62 | HR 64 | Temp 97.4°F | Ht 66.0 in | Wt 183.5 lb

## 2018-03-20 DIAGNOSIS — E782 Mixed hyperlipidemia: Secondary | ICD-10-CM | POA: Diagnosis not present

## 2018-03-20 DIAGNOSIS — J432 Centrilobular emphysema: Secondary | ICD-10-CM | POA: Diagnosis not present

## 2018-03-20 DIAGNOSIS — N183 Chronic kidney disease, stage 3 unspecified: Secondary | ICD-10-CM

## 2018-03-20 DIAGNOSIS — I25810 Atherosclerosis of coronary artery bypass graft(s) without angina pectoris: Secondary | ICD-10-CM | POA: Diagnosis not present

## 2018-03-20 DIAGNOSIS — I4891 Unspecified atrial fibrillation: Secondary | ICD-10-CM

## 2018-03-20 DIAGNOSIS — E1121 Type 2 diabetes mellitus with diabetic nephropathy: Secondary | ICD-10-CM | POA: Diagnosis not present

## 2018-03-20 DIAGNOSIS — I10 Essential (primary) hypertension: Secondary | ICD-10-CM | POA: Diagnosis not present

## 2018-03-20 DIAGNOSIS — I4892 Unspecified atrial flutter: Secondary | ICD-10-CM | POA: Diagnosis not present

## 2018-03-20 LAB — BASIC METABOLIC PANEL
BUN: 36 mg/dL — ABNORMAL HIGH (ref 6–23)
CHLORIDE: 101 meq/L (ref 96–112)
CO2: 31 mEq/L (ref 19–32)
CREATININE: 1.68 mg/dL — AB (ref 0.40–1.50)
Calcium: 9.8 mg/dL (ref 8.4–10.5)
GFR: 41.2 mL/min — AB (ref >60.00)
Glucose, Bld: 116 mg/dL — ABNORMAL HIGH (ref 70–99)
Potassium: 5.2 mEq/L — ABNORMAL HIGH (ref 3.5–5.1)
SODIUM: 137 meq/L (ref 135–145)

## 2018-03-20 LAB — HEMOGLOBIN A1C: HEMOGLOBIN A1C: 7.4 % — AB (ref 4.6–6.5)

## 2018-03-20 MED ORDER — APIXABAN 2.5 MG PO TABS: 2.5000 mg | ORAL_TABLET | Freq: Two times a day (BID) | ORAL | 5 refills | Status: DC

## 2018-03-20 NOTE — Progress Notes (Signed)
Dr. Karleen Hampshire T. Colter Magowan, MD, CAQ [facility] [address] [place], [address] Phone: [phone] Fax: [phone]  03/20/2018  Patient: Timothy Cordova, MRN: [id number], DOB: 03/26/1929, 84 y.o.  Primary Physician:  Hannah Beat, MD   Chief Complaint  Patient presents with  . Follow-up    6 month   Subjective:   Timothy Cordova is a 84 y.o. very pleasant male patient who presents with the following:  CAD and COPD, sees cardiology and pulmonology. Recently dx with a fix, s/p cardioversion and COPD exacerbation post TEE, also.   He is on metoprolol and a statin, and Dr. Mariah Milling placed him on xarelto for a fib. Had to stop Xarelto due to finances.  He can feel his arrythmia.   Would take if financial assistance.    HTN: Tolerating all medications without side effects Stable and at goal No CP, no sob. No HA.  BP Readings from Last 3 Encounters:  03/20/18 120/62  12/21/17 124/60  12/12/17 (!) 110/58    Basic Metabolic Panel:    Component Value Date/Time   NA 140 12/12/2017 1249   NA 133 (L) 05/01/2013 0457   K 4.5 12/12/2017 1249   K 4.7 05/01/2013 0457   CL 98 12/12/2017 1249   CL 101 05/01/2013 0457   CO2 37 (H) 12/12/2017 1249   CO2 29 05/01/2013 0457   BUN 32 (H) 12/12/2017 1249   BUN 41 (H) 05/01/2013 0457   CREATININE 1.63 (H) 12/12/2017 1249   CREATININE 1.57 (H) 05/01/2013 0457   GLUCOSE 125 (H) 12/12/2017 1249   GLUCOSE 274 (H) 05/01/2013 0457   CALCIUM 9.9 12/12/2017 1249   CALCIUM 8.9 05/01/2013 0457    Diabetes Mellitus: Tolerating Medications: yes Compliance with diet: fair Exercise: minimal / intermittent Avg blood sugars at home: not checking Foot problems: none Hypoglycemia: none No nausea, vomitting, blurred vision, polyuria.  Lab Results  Component Value Date   HGBA1C 7.8 (H) 12/01/2017   HGBA1C 7.7 (H) 07/30/2017   HGBA1C 7.8 (H) 10/16/2016   Lab Results  Component Value Date    MICROALBUR 6.8 (H) 06/26/2016   LDLCALC 68 09/10/2017   CREATININE 1.63 (H) 12/12/2017    Wt Readings from Last 3 Encounters:  03/20/18 183 lb 8 oz (83.2 kg)  12/21/17 175 lb 8 oz (79.6 kg)  12/12/17 178 lb 12 oz (81.1 kg)    Body mass index is 29.62 kg/m.   Lipids: Doing well, stable. Tolerating meds fine with no SE. Panel reviewed with patient.  Lipids:    Component Value Date/Time   CHOL 133 09/10/2017 0934   CHOL 155 04/27/2013 0601   TRIG 172.0 (H) 09/10/2017 0934   TRIG 120 04/27/2013 0601   HDL 30.60 (L) 09/10/2017 0934   HDL 69 (H) 04/27/2013 0601   VLDL 34.4 09/10/2017 0934   VLDL 24 04/27/2013 0601   CHOLHDL 4 09/10/2017 0934    Lab Results  Component Value Date   ALT 21 12/12/2017   AST 16 12/12/2017   ALKPHOS 47 12/12/2017   BILITOT 0.6 12/12/2017    Stable CKD stage 3  Past Medical History, Surgical History, Social History, Family History, Problem List, Medications, and Allergies have been reviewed and updated if relevant.  Patient Active Problem List   Diagnosis Date Noted  . CKD (chronic kidney disease) stage 3, GFR 30-59 ml/min (HCC) 08/18/2010    Priority: High  . DIASTOLIC HEART FAILURE, CHRONIC 08/18/2009  Priority: High  . CAD, AUTOLOGOUS BYPASS GRAFT 06/07/2008    Priority: High  . DM (diabetes mellitus), type 2 with renal complications (HCC) 09/04/2007    Priority: High  . Centrilobular emphysema (HCC) 08/08/2005    Priority: High  . Typical atrial flutter (HCC)   . Atrial fibrillation and flutter (HCC)   . Acute respiratory failure with hypoxia (HCC) 11/28/2017  . Sepsis (HCC) 11/28/2017  . COPD (chronic obstructive pulmonary disease) (HCC) 07/30/2017  . Pulmonary HTN (HCC) 07/30/2017  . Solitary pulmonary nodule 12/10/2015  . Anemia 11/26/2014  . COPD with acute exacerbation (HCC) 05/21/2013  . Aortic calcification, 04/20/2005 CT Chest 04/03/2013  . Sleep apnea   . Tobacco abuse (in remission)   . Chronic low back pain  04/18/2011  . Renal lesion 03/27/2011  . OSTEOARTHRITIS 12/06/2009  . Mixed hyperlipidemia 06/11/2008  . ALLERGIC RHINITIS 01/30/2007  . HEARING LOSS 07/06/2006  . Essential hypertension 07/06/2006  . BPH (benign prostatic hyperplasia) 07/06/2006    Past Medical History:  Diagnosis Date  . Allergic rhinitis   . Benign prostatic hypertrophy   . Chronic diastolic heart failure (HCC)    8/08 ECHO with EF 55%  . CKD (chronic kidney disease)   . COPD (chronic obstructive pulmonary disease) (HCC)    moderate. followed by Dr.Byrum  . Coronary artery disease    s/p CABG 2006. LHC (1/10): SVG-OM and PLOM, SVG-D, and LIMA-LAD were all patent  . Dementia (HCC)    (mild)--06/2006  . Diabetes mellitus   . Edema    localized. suspect diastolic CHF + venous insufficiency  . Hearing loss    left >>right  . History of tobacco abuse   . Hypertension   . Mixed hyperlipidemia   . Osteoarthritis   . Pneumonia 07/31/2017  . Sleep apnea    ?    Past Surgical History:  Procedure Laterality Date  . ANGIOPLASTY  [id number]  . CARDIAC CATHETERIZATION  90s   [place], [place] x1stent  . CARDIAC CATHETERIZATION     [facility];x2 stents  . CARDIAC CATHETERIZATION  04/29/2013  . CARDIOVERSION N/A 12/05/2017   Procedure: CARDIOVERSION;  Surgeon: Elease Hashimoto Deloris Ping, MD;  Location: [facility] END[facility];  Service: Cardiovascular;  Laterality: N/A;  . CORONARY ARTERY BYPASS GRAFT  02/2005  . ROTATOR CUFF REPAIR  2005   left, [place]  . TEE WITHOUT CARDIOVERSION N/A 12/05/2017   Procedure: TRANSESOPHAGEAL ECHOCARDIOGRAM (TEE);  Surgeon: Elease Hashimoto Deloris Ping, MD;  Location: [facility] ENDOSCOPY;  Service: Cardiovascular;  Laterality: N/A;    Social History   Socioeconomic History  . Marital status: Married    Spouse name: Not on file  . Number of children: Not on file  . Years of education: Not on file  . Highest education level: Not on file  Occupational History  . Occupation: Research officer, trade union: RETIRED    Comment:  RETIRED  Social Needs  . Financial resource strain: Not on file  . Food insecurity:    Worry: Not on file    Inability: Not on file  . Transportation needs:    Medical: Not on file    Non-medical: Not on file  Tobacco Use  . Smoking status: Former Smoker    Packs/day: 3.00    Years: 60.00    Pack years: 180.00    Types: Cigarettes    Last attempt to quit: 07/12/2005    Years since quitting: 12.6  . Smokeless tobacco: Never Used  Substance and Sexual Activity  . Alcohol  use: No    Alcohol/week: 0.0 standard drinks  . Drug use: No  . Sexual activity: Not on file  Lifestyle  . Physical activity:    Days per week: Not on file    Minutes per session: Not on file  . Stress: Not on file  Relationships  . Social connections:    Talks on phone: Not on file    Gets together: Not on file    Attends religious service: Not on file    Active member of club or organization: Not on file    Attends meetings of clubs or organizations: Not on file    Relationship status: Not on file  . Intimate partner violence:    Fear of current or ex partner: Not on file    Emotionally abused: Not on file    Physically abused: Not on file    Forced sexual activity: Not on file  Other Topics Concern  . Not on file  Social History Narrative  . Not on file    Family History  Problem Relation Age of Onset  . Heart attack Mother   . Lung cancer Sister   . Prostate cancer Neg Hx   . Kidney cancer Neg Hx     Allergies  Allergen Reactions  . Metformin And Related Nausea And Vomiting and Rash  . Tramadol Hcl Other (See Comments)    Tremor vs seizure activity  . Codeine Nausea And Vomiting  . Gabapentin Other (See Comments)    "Messed up my nervous system"  . Morphine Sulfate Nausea And Vomiting  . Other Nausea And Vomiting    "Narcotics, in general"  . Ticlopidine Hcl Other (See Comments)    Reaction unknown by patient or family  . Warfarin Sodium Nausea And Vomiting and Other (See Comments)      "Severe fatigue," also  . Ace Inhibitors Rash and Cough  . Tramadol Rash    Medication list reviewed and updated in full in [facility] Link.   GEN: No acute illnesses, no fevers, chills. GI: No n/v/d, eating normally Pulm: No SOB Interactive and getting along well at home.  Otherwise, ROS is as per the HPI.  Objective:   BP 120/62   Pulse 64   Temp (!) 97.4 F (36.3 C) (Oral)   Ht 5\' 6"  (1.676 m)   Wt 183 lb 8 oz (83.2 kg)   BMI 29.62 kg/m   GEN: WDWN, NAD, Non-toxic, A & O x 3 HEENT: Atraumatic, Normocephalic. Neck supple. No masses, No LAD. Ears and Nose: No external deformity. CV: RRR, No M/G/R. No JVD. No thrill. No extra heart sounds. PULM: rare wheezes, no crackles, rhonchi. No retractions. No resp. distress. No accessory muscle use. EXTR: No c/c/ minimal LE edema NEURO Normal gait.  PSYCH: Normally interactive. Conversant. Not depressed or anxious appearing.  Calm demeanor.   Laboratory and Imaging Data:  Assessment and Plan:   Atrial fibrillation and flutter (HCC)  Atherosclerosis of autologous vein coronary artery bypass graft, angina presence unspecified  Centrilobular emphysema (HCC)  Essential hypertension  Mixed hyperlipidemia  CKD (chronic kidney disease) stage 3, GFR 30-59 ml/min (HCC) - Plan: Basic metabolic panel  Type 2 diabetes mellitus with diabetic nephropathy, without long-term current use of insulin (HCC) - Plan: Hemoglobin A1c  He has stopped his Xarelto due to cost of 300 dollars to him monthly I am asking RN Angelica Chessman to help get him set up with Eliquis financial program to help him financially, which would be  a viable alternative.  Renal dosing at 84 years old with 2.5 mg po bid  Rel stable right now with COPD  Recheck CKD. Lasix 20 mg only now  Recheck a1c, most recently 7.8. No additional meds at this level at 84 yo  Cc: Dr. Mariah MillingGollan  Follow-up: Return in about 6 months (around 09/19/2018).  Meds ordered this encounter   Medications  . apixaban (ELIQUIS) 2.5 MG TABS tablet    Sig: Take 1 tablet (2.5 mg total) by mouth 2 (two) times daily.    Dispense:  60 tablet    Refill:  5   Orders Placed This Encounter  Procedures  . Basic metabolic panel  . Hemoglobin A1c    Signed,  Jacynda Brunke T. Cordarrel Stiefel, MD   Outpatient Encounter Medications as of 03/20/2018  Medication Sig  . albuterol (PROVENTIL HFA;VENTOLIN HFA) 108 (90 Base) MCG/ACT inhaler Inhale 2 puffs into the lungs every 6 (six) hours as needed for wheezing or shortness of breath.  . Carboxymethylcellulose Sod PF 0.25 % SOLN Place 2 drops into both eyes 2 (two) times daily.   . diphenhydramine-acetaminophen (TYLENOL PM) 25-500 MG TABS tablet Take 1 tablet by mouth at bedtime as needed (for sleep).   [name]. doxazosin (CARDURA) 8 MG tablet Take 4 mg by mouth at bedtime.   . furosemide (LASIX) 20 MG tablet Take 20 mg by mouth daily.  [name]. glipiZIDE (GLUCOTROL) 5 MG tablet Take 1 tablet (5 mg total) by mouth 2 (two) times daily before a meal.  . metoprolol (TOPROL-XL) 200 MG 24 hr tablet Take 100 mg by mouth daily.  . Multiple Vitamins-Minerals (CENTRUM SILVER 50+MEN PO) Take 1 tablet by mouth daily.  . Multiple Vitamins-Minerals (PRESERVISION AREDS 2) CAPS Take 1 capsule by mouth daily with breakfast.   . ONE TOUCH ULTRA TEST test strip USE TO CHECK BLOOD SUGAR 2 TIMES DAILY AS DIRECTED  . ONETOUCH DELICA LANCETS 33G MISC   . rosuvastatin (CRESTOR) 40 MG tablet Take 1 tablet (40 mg total) by mouth daily.  . Tiotropium Bromide-Olodaterol (STIOLTO RESPIMAT) 2.5-2.5 MCG/ACT AERS Inhale 2 puffs into the lungs daily.   [name]. apixaban (ELIQUIS) 2.5 MG TABS tablet Take 1 tablet (2.5 mg total) by mouth 2 (two) times daily.  . [DISCONTINUED] Rivaroxaban (XARELTO) 15 MG TABS tablet Take 1 tablet (15 mg total) by mouth daily with supper. (Patient not taking: Reported on 03/20/2018)   No facility-administered encounter medications on file as of 03/20/2018.

## 2018-04-10 ENCOUNTER — Emergency Department (HOSPITAL_COMMUNITY): Payer: Medicare PPO

## 2018-04-10 ENCOUNTER — Other Ambulatory Visit: Payer: Self-pay

## 2018-04-10 ENCOUNTER — Observation Stay (HOSPITAL_COMMUNITY)
Admission: EM | Admit: 2018-04-10 | Discharge: 2018-04-12 | Disposition: A | Discharge status: Home Health | Payer: Medicare PPO | Attending: Internal Medicine | Admitting: Internal Medicine

## 2018-04-10 ENCOUNTER — Encounter (HOSPITAL_COMMUNITY): Payer: Self-pay | Admitting: Internal Medicine

## 2018-04-10 DIAGNOSIS — I5032 Chronic diastolic (congestive) heart failure: Secondary | ICD-10-CM | POA: Insufficient documentation

## 2018-04-10 DIAGNOSIS — J9620 Acute and chronic respiratory failure, unspecified whether with hypoxia or hypercapnia: Secondary | ICD-10-CM | POA: Insufficient documentation

## 2018-04-10 DIAGNOSIS — Z87891 Personal history of nicotine dependence: Secondary | ICD-10-CM | POA: Insufficient documentation

## 2018-04-10 DIAGNOSIS — I2510 Atherosclerotic heart disease of native coronary artery without angina pectoris: Secondary | ICD-10-CM | POA: Insufficient documentation

## 2018-04-10 DIAGNOSIS — T380X5A Adverse effect of glucocorticoids and synthetic analogues, initial encounter: Secondary | ICD-10-CM | POA: Insufficient documentation

## 2018-04-10 DIAGNOSIS — Z79899 Other long term (current) drug therapy: Secondary | ICD-10-CM | POA: Diagnosis not present

## 2018-04-10 DIAGNOSIS — G4733 Obstructive sleep apnea (adult) (pediatric): Secondary | ICD-10-CM | POA: Diagnosis not present

## 2018-04-10 DIAGNOSIS — I700 Atherosclerosis of aorta: Secondary | ICD-10-CM | POA: Insufficient documentation

## 2018-04-10 DIAGNOSIS — N400 Enlarged prostate without lower urinary tract symptoms: Secondary | ICD-10-CM | POA: Insufficient documentation

## 2018-04-10 DIAGNOSIS — F0390 Unspecified dementia without behavioral disturbance: Secondary | ICD-10-CM | POA: Diagnosis not present

## 2018-04-10 DIAGNOSIS — Z9114 Patient's other noncompliance with medication regimen: Secondary | ICD-10-CM | POA: Insufficient documentation

## 2018-04-10 DIAGNOSIS — Z8249 Family history of ischemic heart disease and other diseases of the circulatory system: Secondary | ICD-10-CM | POA: Diagnosis not present

## 2018-04-10 DIAGNOSIS — I130 Hypertensive heart and chronic kidney disease with heart failure and stage 1 through stage 4 chronic kidney disease, or unspecified chronic kidney disease: Secondary | ICD-10-CM | POA: Diagnosis not present

## 2018-04-10 DIAGNOSIS — J441 Chronic obstructive pulmonary disease with (acute) exacerbation: Principal | ICD-10-CM | POA: Insufficient documentation

## 2018-04-10 DIAGNOSIS — E782 Mixed hyperlipidemia: Secondary | ICD-10-CM | POA: Diagnosis not present

## 2018-04-10 DIAGNOSIS — Z66 Do not resuscitate: Secondary | ICD-10-CM | POA: Insufficient documentation

## 2018-04-10 DIAGNOSIS — E1121 Type 2 diabetes mellitus with diabetic nephropathy: Secondary | ICD-10-CM | POA: Diagnosis present

## 2018-04-10 DIAGNOSIS — Z951 Presence of aortocoronary bypass graft: Secondary | ICD-10-CM | POA: Diagnosis not present

## 2018-04-10 DIAGNOSIS — E1122 Type 2 diabetes mellitus with diabetic chronic kidney disease: Secondary | ICD-10-CM | POA: Insufficient documentation

## 2018-04-10 DIAGNOSIS — I10 Essential (primary) hypertension: Secondary | ICD-10-CM | POA: Diagnosis present

## 2018-04-10 DIAGNOSIS — M1990 Unspecified osteoarthritis, unspecified site: Secondary | ICD-10-CM | POA: Insufficient documentation

## 2018-04-10 DIAGNOSIS — I440 Atrioventricular block, first degree: Secondary | ICD-10-CM | POA: Diagnosis not present

## 2018-04-10 DIAGNOSIS — I4891 Unspecified atrial fibrillation: Secondary | ICD-10-CM | POA: Diagnosis not present

## 2018-04-10 DIAGNOSIS — Z6829 Body mass index (BMI) 29.0-29.9, adult: Secondary | ICD-10-CM | POA: Insufficient documentation

## 2018-04-10 DIAGNOSIS — N183 Chronic kidney disease, stage 3 unspecified: Secondary | ICD-10-CM | POA: Diagnosis present

## 2018-04-10 DIAGNOSIS — E1165 Type 2 diabetes mellitus with hyperglycemia: Secondary | ICD-10-CM | POA: Diagnosis not present

## 2018-04-10 DIAGNOSIS — I491 Atrial premature depolarization: Secondary | ICD-10-CM | POA: Insufficient documentation

## 2018-04-10 DIAGNOSIS — Z7982 Long term (current) use of aspirin: Secondary | ICD-10-CM | POA: Diagnosis not present

## 2018-04-10 DIAGNOSIS — R0789 Other chest pain: Secondary | ICD-10-CM | POA: Diagnosis not present

## 2018-04-10 DIAGNOSIS — I4892 Unspecified atrial flutter: Secondary | ICD-10-CM | POA: Diagnosis not present

## 2018-04-10 DIAGNOSIS — J189 Pneumonia, unspecified organism: Secondary | ICD-10-CM

## 2018-04-10 DIAGNOSIS — I4510 Unspecified right bundle-branch block: Secondary | ICD-10-CM | POA: Insufficient documentation

## 2018-04-10 DIAGNOSIS — E663 Overweight: Secondary | ICD-10-CM | POA: Insufficient documentation

## 2018-04-10 DIAGNOSIS — J9809 Other diseases of bronchus, not elsewhere classified: Secondary | ICD-10-CM | POA: Diagnosis not present

## 2018-04-10 DIAGNOSIS — Z7984 Long term (current) use of oral hypoglycemic drugs: Secondary | ICD-10-CM | POA: Insufficient documentation

## 2018-04-10 LAB — BASIC METABOLIC PANEL WITH GFR
Anion gap: 11 (ref 5–15)
BUN: 37 mg/dL — ABNORMAL HIGH (ref 8–23)
CO2: 24 mmol/L (ref 22–32)
Calcium: 9.8 mg/dL (ref 8.9–10.3)
Chloride: 102 mmol/L (ref 98–111)
Creatinine, Ser: 1.8 mg/dL — ABNORMAL HIGH (ref 0.61–1.24)
GFR calc Af Amer: 38 mL/min — ABNORMAL LOW
GFR calc non Af Amer: 33 mL/min — ABNORMAL LOW
Glucose, Bld: 201 mg/dL — ABNORMAL HIGH (ref 70–99)
Potassium: 5.8 mmol/L — ABNORMAL HIGH (ref 3.5–5.1)
Sodium: 137 mmol/L (ref 135–145)

## 2018-04-10 LAB — BRAIN NATRIURETIC PEPTIDE: B Natriuretic Peptide: 129 pg/mL — ABNORMAL HIGH (ref 0.0–100.0)

## 2018-04-10 LAB — CBC
HCT: 38.4 % — ABNORMAL LOW (ref 39.0–52.0)
Hemoglobin: 11.6 g/dL — ABNORMAL LOW (ref 13.0–17.0)
MCH: 29.9 pg (ref 26.0–34.0)
MCHC: 30.2 g/dL (ref 30.0–36.0)
MCV: 99 fL (ref 80.0–100.0)
Platelets: 354 10*3/uL (ref 150–400)
RBC: 3.88 MIL/uL — ABNORMAL LOW (ref 4.22–5.81)
RDW: 11.7 % (ref 11.5–15.5)
WBC: 9.9 10*3/uL (ref 4.0–10.5)
nRBC: 0 % (ref 0.0–0.2)

## 2018-04-10 LAB — GLUCOSE, CAPILLARY
Glucose-Capillary: 215 mg/dL — ABNORMAL HIGH (ref 70–99)
Glucose-Capillary: 393 mg/dL — ABNORMAL HIGH (ref 70–99)

## 2018-04-10 LAB — I-STAT TROPONIN, ED: Troponin i, poc: 0.01 ng/mL (ref 0.00–0.08)

## 2018-04-10 LAB — PROCALCITONIN: Procalcitonin: <0.1 ng/mL

## 2018-04-10 MED ORDER — ENOXAPARIN SODIUM 40 MG/0.4ML ~~LOC~~ SOLN
40.0000 mg | SUBCUTANEOUS | Status: DC
  Administered 2018-04-10: 40 mg via SUBCUTANEOUS
  Filled 2018-04-10: qty 0.4

## 2018-04-10 MED ORDER — SODIUM CHLORIDE 0.9 % IV SOLN
Freq: Once | INTRAVENOUS | Status: AC
  Administered 2018-04-10: 13:00:00 via INTRAVENOUS

## 2018-04-10 MED ORDER — IOPAMIDOL (ISOVUE-370) INJECTION 76%
60.0000 mL | Freq: Once | INTRAVENOUS | Status: AC | PRN
  Administered 2018-04-10: 60 mL via INTRAVENOUS

## 2018-04-10 MED ORDER — ASPIRIN EC 81 MG PO TBEC
81.0000 mg | DELAYED_RELEASE_TABLET | Freq: Every day | ORAL | Status: DC
  Administered 2018-04-10 – 2018-04-12 (×3): 81 mg via ORAL
  Filled 2018-04-10 (×3): qty 1

## 2018-04-10 MED ORDER — SODIUM CHLORIDE 0.9 % IV SOLN
2.0000 g | Freq: Once | INTRAVENOUS | Status: AC
  Administered 2018-04-10: 2 g via INTRAVENOUS
  Filled 2018-04-10: qty 20

## 2018-04-10 MED ORDER — SODIUM CHLORIDE 0.9% FLUSH
3.0000 mL | Freq: Two times a day (BID) | INTRAVENOUS | Status: DC
  Administered 2018-04-10 – 2018-04-11 (×3): 3 mL via INTRAVENOUS

## 2018-04-10 MED ORDER — IPRATROPIUM-ALBUTEROL 0.5-2.5 (3) MG/3ML IN SOLN
3.0000 mL | Freq: Once | RESPIRATORY_TRACT | Status: AC
  Administered 2018-04-10: 3 mL via RESPIRATORY_TRACT
  Filled 2018-04-10: qty 3

## 2018-04-10 MED ORDER — DOXYCYCLINE HYCLATE 100 MG PO TABS
100.0000 mg | ORAL_TABLET | Freq: Two times a day (BID) | ORAL | Status: DC
  Administered 2018-04-11 – 2018-04-12 (×3): 100 mg via ORAL
  Filled 2018-04-10 (×3): qty 1

## 2018-04-10 MED ORDER — INSULIN ASPART 100 UNIT/ML ~~LOC~~ SOLN
0.0000 [IU] | Freq: Three times a day (TID) | SUBCUTANEOUS | Status: DC
  Administered 2018-04-11: 5 [IU] via SUBCUTANEOUS
  Administered 2018-04-11 (×2): 11 [IU] via SUBCUTANEOUS

## 2018-04-10 MED ORDER — SODIUM CHLORIDE 0.9 % IV SOLN: 2.0000 g | INTRAVENOUS | Status: DC

## 2018-04-10 MED ORDER — ALBUTEROL SULFATE (2.5 MG/3ML) 0.083% IN NEBU: 2.5000 mg | INHALATION_SOLUTION | RESPIRATORY_TRACT | Status: DC | PRN

## 2018-04-10 MED ORDER — IOPAMIDOL (ISOVUE-370) INJECTION 76%
INTRAVENOUS | Status: AC
  Filled 2018-04-10: qty 100

## 2018-04-10 MED ORDER — PREDNISONE 20 MG PO TABS: 40.0000 mg | ORAL_TABLET | Freq: Every day | ORAL | Status: DC

## 2018-04-10 MED ORDER — SODIUM CHLORIDE 0.9 % IV SOLN
100.0000 mg | Freq: Once | INTRAVENOUS | Status: AC
  Administered 2018-04-10: 100 mg via INTRAVENOUS
  Filled 2018-04-10: qty 100

## 2018-04-10 MED ORDER — DOXAZOSIN MESYLATE 4 MG PO TABS
4.0000 mg | ORAL_TABLET | Freq: Every day | ORAL | Status: DC
  Administered 2018-04-10 – 2018-04-11 (×2): 4 mg via ORAL
  Filled 2018-04-10: qty 1
  Filled 2018-04-10: qty 2
  Filled 2018-04-10: qty 1
  Filled 2018-04-10: qty 2

## 2018-04-10 MED ORDER — IPRATROPIUM-ALBUTEROL 0.5-2.5 (3) MG/3ML IN SOLN
3.0000 mL | Freq: Once | RESPIRATORY_TRACT | Status: DC
  Filled 2018-04-10: qty 3

## 2018-04-10 MED ORDER — ROSUVASTATIN CALCIUM 20 MG PO TABS
40.0000 mg | ORAL_TABLET | Freq: Every day | ORAL | Status: DC
  Administered 2018-04-11 – 2018-04-12 (×2): 40 mg via ORAL
  Filled 2018-04-10 (×2): qty 2

## 2018-04-10 MED ORDER — IPRATROPIUM-ALBUTEROL 0.5-2.5 (3) MG/3ML IN SOLN
3.0000 mL | Freq: Four times a day (QID) | RESPIRATORY_TRACT | Status: DC
  Administered 2018-04-10: 3 mL via RESPIRATORY_TRACT
  Filled 2018-04-10: qty 3

## 2018-04-10 MED ORDER — INSULIN ASPART 100 UNIT/ML ~~LOC~~ SOLN
0.0000 [IU] | Freq: Every day | SUBCUTANEOUS | Status: DC
  Administered 2018-04-10: 5 [IU] via SUBCUTANEOUS

## 2018-04-10 MED ORDER — METHYLPREDNISOLONE SODIUM SUCC 125 MG IJ SOLR
60.0000 mg | Freq: Two times a day (BID) | INTRAMUSCULAR | Status: DC
  Administered 2018-04-10: 60 mg via INTRAVENOUS
  Filled 2018-04-10: qty 2

## 2018-04-10 MED ORDER — METOPROLOL SUCCINATE ER 25 MG PO TB24
100.0000 mg | ORAL_TABLET | Freq: Every day | ORAL | Status: DC
  Administered 2018-04-11: 100 mg via ORAL
  Filled 2018-04-10: qty 4

## 2018-04-10 MED ORDER — METHYLPREDNISOLONE SODIUM SUCC 125 MG IJ SOLR
125.0000 mg | Freq: Once | INTRAMUSCULAR | Status: AC
  Administered 2018-04-10: 125 mg via INTRAVENOUS
  Filled 2018-04-10: qty 2

## 2018-04-10 NOTE — ED Triage Notes (Signed)
Patient reports that he has been having tightness around his chest he states for 10 days, "not chest pain but it feels like someone is squeezing my heart and wants to rip it out." as he gestures with his left hand in a fist. He also reports that sometime he cannot take a deep breath.

## 2018-04-10 NOTE — ED Provider Notes (Signed)
[facility] EMERGENCY DEPARTMENT Provider Note   CSN: [id number] Arrival date & time: 04/10/18  1029     History   Chief Complaint Chief Complaint  Patient presents with  . Chest Pain    HPI Timothy Cordova is a 85 y.o. male.  HPI 85 year old male with past medical history as below here with multiple complaints.  The patient states that for the last week to 10 days, he has felt a sharp, burning, pleuritic pain in his left chest.  He states that when he takes a deep breath, the sharp, stabbing pain shoots around to his left side.  He then feels like he cannot breathe any deeper.  Has had associated dry cough.  Denies any sputum production.  Has been wheezing.  Denies any significant lower extremity edema.  Is been taking his Lasix as prescribed.  No specific alleviating factors.  He has not tried anything for the pain. No radiation of pain.  Past Medical History:  Diagnosis Date  . Allergic rhinitis   . Benign prostatic hypertrophy   . Chronic diastolic heart failure (HCC)    8/08 ECHO with EF 55%  . CKD (chronic kidney disease)   . COPD (chronic obstructive pulmonary disease) (HCC)    moderate. followed by Dr.Byrum  . Coronary artery disease    s/p CABG 2006. LHC (1/10): SVG-OM and PLOM, SVG-D, and LIMA-LAD were all patent  . Dementia (HCC)    (mild)--06/2006  . Diabetes mellitus   . Edema    localized. suspect diastolic CHF + venous insufficiency  . Hearing loss    left >>right  . History of tobacco abuse   . Hypertension   . Mixed hyperlipidemia   . Osteoarthritis   . Pneumonia 07/31/2017  . Sleep apnea    no CPAP    Patient Active Problem List   Diagnosis Date Noted  . Typical atrial flutter (HCC)   . Atrial fibrillation and flutter (HCC)   . Acute respiratory failure with hypoxia (HCC) 11/28/2017  . Sepsis (HCC) 11/28/2017  . COPD (chronic obstructive pulmonary disease) (HCC) 07/30/2017  . Pulmonary HTN (HCC) 07/30/2017  . Solitary  pulmonary nodule 12/10/2015  . Anemia 11/26/2014  . COPD with acute exacerbation (HCC) 05/21/2013  . Aortic calcification, 04/20/2005 CT Chest 04/03/2013  . Sleep apnea   . Tobacco abuse (in remission)   . Chronic low back pain 04/18/2011  . Renal lesion 03/27/2011  . CKD (chronic kidney disease) stage 3, GFR 30-59 ml/min (HCC) 08/18/2010  . OSTEOARTHRITIS 12/06/2009  . DIASTOLIC HEART FAILURE, CHRONIC 08/18/2009  . Mixed hyperlipidemia 06/11/2008  . CAD, AUTOLOGOUS BYPASS GRAFT 06/07/2008  . DM (diabetes mellitus), type 2 with renal complications (HCC) 09/04/2007  . ALLERGIC RHINITIS 01/30/2007  . HEARING LOSS 07/06/2006  . Essential hypertension 07/06/2006  . BPH (benign prostatic hyperplasia) 07/06/2006  . Centrilobular emphysema (HCC) 08/08/2005    Past Surgical History:  Procedure Laterality Date  . ANGIOPLASTY  [id number]  . CARDIAC CATHETERIZATION  90s   [place], [place] x1stent  . CARDIAC CATHETERIZATION     [facility];x2 stents  . CARDIAC CATHETERIZATION  04/29/2013  . CARDIOVERSION N/A 12/05/2017   Procedure: CARDIOVERSION;  Surgeon: Elease Hashimoto Deloris Ping, MD;  Location: [facility] ENDOSCOPY;  Service: Cardiovascular;  Laterality: N/A;  . CORONARY ARTERY BYPASS GRAFT  02/2005  . ROTATOR CUFF REPAIR  2005   left, Gioffre  . TEE WITHOUT CARDIOVERSION N/A 12/05/2017   Procedure: TRANSESOPHAGEAL ECHOCARDIOGRAM (TEE);  Surgeon: Vesta Mixer, MD;  Location: [facility] ENDOSCOPY;  Service: Cardiovascular;  Laterality: N/A;        Home Medications    Prior to Admission medications   Medication Sig Start Date End Date Taking? Authorizing Provider  albuterol (PROVENTIL HFA;VENTOLIN HFA) 108 (90 Base) MCG/ACT inhaler Inhale 2 puffs into the lungs every 6 (six) hours as needed for wheezing or shortness of breath. 10/09/15  Yes Renae FickleShort, Mackenzie, MD  aspirin EC 81 MG tablet Take 81 mg by mouth daily.   Yes [provider]  Carboxymethylcellulose Sod PF 0.25 % SOLN Place 2 drops into both eyes 2 (two)  times daily.    Yes [provider]  diphenhydramine-acetaminophen (TYLENOL PM) 25-500 MG TABS tablet Take 1 tablet by mouth at bedtime as needed (for sleep).    Yes [provider]  doxazosin (CARDURA) 8 MG tablet Take 4 mg by mouth at bedtime.    Yes [provider]  furosemide (LASIX) 20 MG tablet Take 20 mg by mouth daily.   Yes [provider]  glipiZIDE (GLUCOTROL) 5 MG tablet Take 1 tablet (5 mg total) by mouth 2 (two) times daily before a meal. 08/08/17  Yes Copland, Karleen HampshireSpencer, MD  metoprolol (TOPROL-XL) 200 MG 24 hr tablet Take 100 mg by mouth daily.   Yes [provider]  Multiple Vitamins-Minerals (CENTRUM SILVER 50+MEN PO) Take 1 tablet by mouth daily.   Yes [provider]  Multiple Vitamins-Minerals (PRESERVISION AREDS 2) CAPS Take 1 capsule by mouth daily with breakfast.    Yes [provider]  rosuvastatin (CRESTOR) 40 MG tablet Take 1 tablet (40 mg total) by mouth daily. 09/17/17  Yes Copland, Karleen HampshireSpencer, MD  Tiotropium Bromide-Olodaterol (STIOLTO RESPIMAT) 2.5-2.5 MCG/ACT AERS Inhale 2 puffs into the lungs daily.    Yes [provider]  ONE TOUCH ULTRA TEST test strip USE TO CHECK BLOOD SUGAR 2 TIMES DAILY AS DIRECTED 09/08/15   Copland, Karleen HampshireSpencer, MD  [facility]  02/13/13   [provider]    Family History Family History  Problem Relation Age of Onset  . Heart attack Mother   . Lung cancer Sister   . Prostate cancer Neg Hx   . Kidney cancer Neg Hx     Social History Social History   Tobacco Use  . Smoking status: Former Smoker    Packs/day: 3.00    Years: 60.00    Pack years: 180.00    Types: Cigarettes    Last attempt to quit: 07/12/2005    Years since quitting: 12.7  . Smokeless tobacco: Never Used  Substance Use Topics  . Alcohol use: No    Alcohol/week: 0.0 standard drinks  . Drug use: No     Allergies   Metformin and related; Tramadol hcl; Codeine; Gabapentin;  Morphine sulfate; Other; Ticlopidine hcl; Warfarin sodium; Ace inhibitors; and Tramadol   Review of Systems Review of Systems  Constitutional: Positive for fatigue. Negative for chills and fever.  HENT: Negative for congestion and rhinorrhea.   Eyes: Negative for visual disturbance.  Respiratory: Positive for cough and shortness of breath. Negative for wheezing.   Cardiovascular: Positive for chest pain. Negative for leg swelling.  Gastrointestinal: Negative for abdominal pain, diarrhea, nausea and vomiting.  Genitourinary: Negative for dysuria and flank pain.  Musculoskeletal: Negative for neck pain and neck stiffness.  Skin: Negative for rash and wound.  Allergic/Immunologic: Negative for immunocompromised state.  Neurological: Positive for weakness. Negative for syncope and headaches.  All other systems reviewed and  are negative.    Physical Exam Updated Vital Signs BP 117/69 (BP Location: Left Arm)   Pulse 71   Temp 97.8 F (36.6 C) (Oral)   Resp 15   SpO2 92%   Physical Exam Vitals signs and nursing note reviewed.  Constitutional:      General: He is not in acute distress.    Appearance: He is well-developed.     Comments: Appears uncomfortable  HENT:     Head: Normocephalic and atraumatic.  Eyes:     Conjunctiva/sclera: Conjunctivae normal.  Neck:     Musculoskeletal: Neck supple.  Cardiovascular:     Rate and Rhythm: Normal rate and regular rhythm.     Heart sounds: Normal heart sounds. No murmur. No friction rub.  Pulmonary:     Effort: Pulmonary effort is normal. No respiratory distress.     Breath sounds: Examination of the right-upper field reveals wheezing. Examination of the left-upper field reveals wheezing. Examination of the right-middle field reveals wheezing. Examination of the left-middle field reveals wheezing. Examination of the right-lower field reveals wheezing and rhonchi. Examination of the left-lower field reveals wheezing and rhonchi. Decreased  breath sounds, wheezing and rhonchi present. No rales.  Abdominal:     General: There is no distension.     Palpations: Abdomen is soft.     Tenderness: There is no abdominal tenderness.  Skin:    General: Skin is warm.     Capillary Refill: Capillary refill takes less than 2 seconds.  Neurological:     Mental Status: He is alert and oriented to person, place, and time.     Motor: No abnormal muscle tone.      ED Treatments / Results  Labs (all labs ordered are listed, but only abnormal results are displayed) Labs Reviewed  BASIC METABOLIC PANEL - Abnormal; Notable for the following components:      Result Value   Potassium 5.8 (*)    Glucose, Bld 201 (*)    BUN 37 (*)    Creatinine, Ser 1.80 (*)    GFR calc non Af Amer 33 (*)    GFR calc Af Amer 38 (*)    All other components within normal limits  CBC - Abnormal; Notable for the following components:   RBC 3.88 (*)    Hemoglobin 11.6 (*)    HCT 38.4 (*)    All other components within normal limits  BRAIN NATRIURETIC PEPTIDE - Abnormal; Notable for the following components:   B Natriuretic Peptide 129.0 (*)    All other components within normal limits  GLUCOSE, CAPILLARY - Abnormal; Notable for the following components:   Glucose-Capillary 215 (*)    All other components within normal limits  CULTURE, BLOOD (ROUTINE X 2)  CULTURE, BLOOD (ROUTINE X 2)  PROCALCITONIN  I-STAT TROPONIN, ED    EKG EKG Interpretation  Date/Time:  Wednesday April 10 2018 10:33:50 EST Ventricular Rate:  75 PR Interval:  244 QRS Duration: 148 QT Interval:  394 QTC Calculation: 439 R Axis:   -76 Text Interpretation:  Sinus rhythm with 1st degree A-V block with Premature supraventricular complexes Left axis deviation Right bundle branch block Left ventricular hypertrophy with QRS widening Anterior infarct , age undetermined Abnormal ECG No significant change since last tracing Confirmed by Shaune Pollack [phone]) on 04/10/2018 7:48:19  PM   Radiology Dg Chest 2 View  Result Date: 04/10/2018 CLINICAL DATA:  85 year old male with history of tightness in the chest for the past 10 days. EXAM:  CHEST - 2 VIEW COMPARISON:  Chest x-ray 11/30/2017. FINDINGS: Diffuse peribronchial cuffing (most severe in the right lung). No acute consolidative airspace disease. No pleural effusions. No evidence of pulmonary edema. Heart size is normal. Upper mediastinal contours are within normal limits. Aortic atherosclerosis. Status post median sternotomy for CABG. Electronic device projecting over the lower left hemithorax. IMPRESSION: 1. Severe diffuse peribronchial cuffing asymmetrically distributed involving the right lung to a greater extent than the left, concerning for an acute bronchitis. 2. Aortic atherosclerosis. Electronically Signed   By: Trudie Reed M.D.   On: 04/10/2018 11:32   Ct Angio Chest Pe W Or Wo Contrast  Result Date: 04/10/2018 CLINICAL DATA:  Chest tightness. EXAM: CT ANGIOGRAPHY CHEST WITH CONTRAST TECHNIQUE: Multidetector CT imaging of the chest was performed using the standard protocol during bolus administration of intravenous contrast. Multiplanar CT image reconstructions and MIPs were obtained to evaluate the vascular anatomy. CONTRAST:  62mL ISOVUE-370 IOPAMIDOL (ISOVUE-370) INJECTION 76% COMPARISON:  Chest x-ray dated 04/10/2018 and chest CTs dated 07/31/2017 and 12/10/2015 FINDINGS: Cardiovascular: No pulmonary emboli. Extensive aortic atherosclerosis and coronary artery calcifications. CABG. Heart size is normal. No pericardial effusion. Stent in the left subclavian artery. Mediastinum/Nodes: No enlarged mediastinal, hilar, or axillary lymph nodes. Thyroid gland, trachea, and esophagus demonstrate no significant findings. Lungs/Pleura: Patient has an extensive faint interstitial infiltrate involving the right upper lobe and to a lesser degree the right lower lobe. There small patchy areas of new infiltrate in the left upper  lobe and in the superior segment of the left lower lobe. There is a chronic bleb posteriorly in right lower lobe. New tiny right effusion. Upper Abdomen: No acute abnormalities. Stable 15 mm exophytic dense lesion on the posterior aspect of the right kidney consistent with a hyperdense cyst. Musculoskeletal: No acute abnormality. Flowing osteophytes fuse almost the entire thoracic spine. Review of the MIP images confirms the above findings. IMPRESSION: 1. No pulmonary emboli. 2. Extensive new faint interstitial infiltrate involving the right upper lobe and to a lesser degree the right lower lobe. 3. Small new patchy areas of infiltrate in the left upper lobe and superior segment of the left lower lobe. 4.  Aortic Atherosclerosis (ICD10-I70.0). Electronically Signed   By: Francene Boyers M.D.   On: 04/10/2018 15:25    Procedures Procedures (including critical care time)  Medications Ordered in ED Medications  iopamidol (ISOVUE-370) 76 % injection (has no administration in time range)  ipratropium-albuterol (DUONEB) 0.5-2.5 (3) MG/3ML nebulizer solution 3 mL (3 mLs Nebulization Not Given 04/10/18 1614)  cefTRIAXone (ROCEPHIN) 2 g in sodium chloride 0.9 % 100 mL IVPB (has no administration in time range)  doxycycline (VIBRAMYCIN) 100 mg in sodium chloride 0.9 % 250 mL IVPB (100 mg Intravenous New Bag/Given 04/10/18 1920)  aspirin EC tablet 81 mg (81 mg Oral Given 04/10/18 1917)  doxazosin (CARDURA) tablet 4 mg (has no administration in time range)  metoprolol succinate (TOPROL-XL) 24 hr tablet 100 mg (has no administration in time range)  rosuvastatin (CRESTOR) tablet 40 mg (has no administration in time range)  insulin aspart (novoLOG) injection 0-15 Units (0 Units Subcutaneous Not Given 04/10/18 1943)  enoxaparin (LOVENOX) injection 40 mg (has no administration in time range)  sodium chloride flush (NS) 0.9 % injection 3 mL (has no administration in time range)  cefTRIAXone (ROCEPHIN) 2 g in sodium chloride  0.9 % 100 mL IVPB (has no administration in time range)  doxycycline (VIBRA-TABS) tablet 100 mg (has no administration in time range)  methylPREDNISolone  sodium succinate (SOLU-MEDROL) 125 mg/2 mL injection 60 mg (has no administration in time range)    Followed by  predniSONE (DELTASONE) tablet 40 mg (has no administration in time range)  ipratropium-albuterol (DUONEB) 0.5-2.5 (3) MG/3ML nebulizer solution 3 mL (has no administration in time range)  albuterol (PROVENTIL) (2.5 MG/3ML) 0.083% nebulizer solution 2.5 mg (has no administration in time range)  insulin aspart (novoLOG) injection 0-5 Units (has no administration in time range)  ipratropium-albuterol (DUONEB) 0.5-2.5 (3) MG/3ML nebulizer solution 3 mL (3 mLs Nebulization Given 04/10/18 1158)  0.9 %  sodium chloride infusion ( Intravenous New Bag/Given 04/10/18 1307)  ipratropium-albuterol (DUONEB) 0.5-2.5 (3) MG/3ML nebulizer solution 3 mL (3 mLs Nebulization Given 04/10/18 1510)  iopamidol (ISOVUE-370) 76 % injection 60 mL (60 mLs Intravenous Contrast Given 04/10/18 1449)  methylPREDNISolone sodium succinate (SOLU-MEDROL) 125 mg/2 mL injection 125 mg (125 mg Intravenous Given 04/10/18 1614)     Initial Impression / Assessment and Plan / ED Course  I have reviewed the triage vital signs and the nursing notes.  Pertinent labs & imaging results that were available during my care of the patient were reviewed by me and considered in my medical decision making (see chart for details).     85 yo M here with cough, wheezing, SOB and pleuritic CP. EKG is non-ischemic and trop neg, doubt ACS. Given pleurisy and increased WOB, CT Angio obtained (IVF ordered given CrCl) and is c/w ILD/bronchitis with possible PNA. ABX, steroids, nebs ordered. Admit to medicine.  Final Clinical Impressions(s) / ED Diagnoses   Final diagnoses:  Atypical pneumonia  COPD exacerbation [facility](HCC)    ED Discharge Orders    None       Shaune PollackIsaacs, Kameron Glazebrook, MD 04/10/18  1950

## 2018-04-10 NOTE — ED Notes (Signed)
Yates, MD at bedside 

## 2018-04-10 NOTE — H&P (Addendum)
History and Physical    Timothy Cordova ZJQ:[id number] DOB: [birth year] DOA: 04/10/2018  PCP: Hannah Beat, MD Consultants:  Mariah Milling - cardiology; Kendrick Fries - pulmonology Patient coming from:   Home - lives with wife (he is her caregiver); NOK: Wife, [phone]  Chief Complaint: Chest pain and SOB  HPI: Timothy Cordova is a 85 y.o. male with medical history significant of OSA; HLD; HTN; DM; dementia; CAD s/p CABG; COPD; dementia; CKD; and chronic diastolic CHF presenting with chest pain.   Patient reports that he has had SOB.  Also with chest pain when he breathes hard.  He was concerned about possible PNA.  Symptoms started about 10 days ago - he thought he had indigestion and it would pass.  Certain foods seemed to make it worse but pressure.  No cough - chronic intermittent dry cough.  No fever.  +wheezing.     ED Course: Atypical interstitial PNA vs. COPD.  Cough, SOB much worse over a few days with pleuritic CP.  92% on RA, wheezing, responding to nebs.  CXR with interstitial thickening.  CTA done, possible infiltrate with diffuse interstitial inflammation - ?bronchitis vs. ILD.  Seems to be improving with nebs, ordered antibiotics and steroids.  Review of Systems: As per HPI; otherwise review of systems reviewed and negative.   Ambulatory Status:  Ambulates without assistance  Past Medical History:  Diagnosis Date  . Allergic rhinitis   . Benign prostatic hypertrophy   . Chronic diastolic heart failure (HCC)    8/08 ECHO with EF 55%  . CKD (chronic kidney disease)   . COPD (chronic obstructive pulmonary disease) (HCC)    moderate. followed by Dr.Byrum  . Coronary artery disease    s/p CABG 2006. LHC (1/10): SVG-OM and PLOM, SVG-D, and LIMA-LAD were all patent  . Dementia (HCC)    (mild)--06/2006  . Diabetes mellitus   . Edema    localized. suspect diastolic CHF + venous insufficiency  . Hearing loss    left >>right  . History of tobacco abuse   . Hypertension   .  Mixed hyperlipidemia   . Osteoarthritis   . Pneumonia 07/31/2017  . Sleep apnea    no CPAP    Past Surgical History:  Procedure Laterality Date  . ANGIOPLASTY  [id number]  . CARDIAC CATHETERIZATION  90s   [place], [place] x1stent  . CARDIAC CATHETERIZATION     [facility];x2 stents  . CARDIAC CATHETERIZATION  04/29/2013  . CARDIOVERSION N/A 12/05/2017   Procedure: CARDIOVERSION;  Surgeon: Elease Hashimoto Deloris Ping, MD;  Location: [facility] ENDOSCOPY;  Service: Cardiovascular;  Laterality: N/A;  . CORONARY ARTERY BYPASS GRAFT  02/2005  . ROTATOR CUFF REPAIR  2005   left, [place]  . TEE WITHOUT CARDIOVERSION N/A 12/05/2017   Procedure: TRANSESOPHAGEAL ECHOCARDIOGRAM (TEE);  Surgeon: Elease Hashimoto Deloris Ping, MD;  Location: [facility] ENDOSCOPY;  Service: Cardiovascular;  Laterality: N/A;    Social History   Socioeconomic History  . Marital status: Married    Spouse name: Not on file  . Number of children: Not on file  . Years of education: Not on file  . Highest education level: Not on file  Occupational History  . Occupation: Research officer, trade union: RETIRED    Comment: RETIRED  Social Needs  . Financial resource strain: Not on file  . Food insecurity:    Worry: Not on file    Inability: Not on file  . Transportation needs:    Medical: Not on file  Non-medical: Not on file  Tobacco Use  . Smoking status: Former Smoker    Packs/day: 3.00    Years: 60.00    Pack years: 180.00    Types: Cigarettes    Last attempt to quit: 07/12/2005    Years since quitting: 12.7  . Smokeless tobacco: Never Used  Substance and Sexual Activity  . Alcohol use: No    Alcohol/week: 0.0 standard drinks  . Drug use: No  . Sexual activity: Not on file  Lifestyle  . Physical activity:    Days per week: Not on file    Minutes per session: Not on file  . Stress: Not on file  Relationships  . Social connections:    Talks on phone: Not on file    Gets together: Not on file    Attends religious service: Not on file    Active  member of club or organization: Not on file    Attends meetings of clubs or organizations: Not on file    Relationship status: Not on file  . Intimate partner violence:    Fear of current or ex partner: Not on file    Emotionally abused: Not on file    Physically abused: Not on file    Forced sexual activity: Not on file  Other Topics Concern  . Not on file  Social History Narrative  . Not on file    Allergies  Allergen Reactions  . Metformin And Related Nausea And Vomiting and Rash  . Tramadol Hcl Other (See Comments)    Tremor vs seizure activity  . Codeine Nausea And Vomiting  . Gabapentin Other (See Comments)    "Messed up my nervous system"  . Morphine Sulfate Nausea And Vomiting  . Other Nausea And Vomiting    "Narcotics, in general"  . Ticlopidine Hcl Other (See Comments)    Reaction unknown by patient or family  . Warfarin Sodium Nausea And Vomiting and Other (See Comments)    "Severe fatigue," also  . Ace Inhibitors Rash and Cough  . Tramadol Rash    Family History  Problem Relation Age of Onset  . Heart attack Mother   . Lung cancer Sister   . Prostate cancer Neg Hx   . Kidney cancer Neg Hx     Prior to Admission medications   Medication Sig Start Date End Date Taking? Authorizing Provider  albuterol (PROVENTIL HFA;VENTOLIN HFA) 108 (90 Base) MCG/ACT inhaler Inhale 2 puffs into the lungs every 6 (six) hours as needed for wheezing or shortness of breath. 10/09/15  Yes Renae FickleShort, Mackenzie, MD  aspirin EC 81 MG tablet Take 81 mg by mouth daily.   Yes [provider]  Carboxymethylcellulose Sod PF 0.25 % SOLN Place 2 drops into both eyes 2 (two) times daily.    Yes [provider]  diphenhydramine-acetaminophen (TYLENOL PM) 25-500 MG TABS tablet Take 1 tablet by mouth at bedtime as needed (for sleep).    Yes [provider]  doxazosin (CARDURA) 8 MG tablet Take 4 mg by mouth at bedtime.    Yes [provider]  furosemide (LASIX) 20  MG tablet Take 20 mg by mouth daily.   Yes [provider]  glipiZIDE (GLUCOTROL) 5 MG tablet Take 1 tablet (5 mg total) by mouth 2 (two) times daily before a meal. 08/08/17  Yes Copland, Karleen HampshireSpencer, MD  metoprolol (TOPROL-XL) 200 MG 24 hr tablet Take 100 mg by mouth daily.   Yes [provider]  Multiple Vitamins-Minerals (CENTRUM  SILVER 50+MEN PO) Take 1 tablet by mouth daily.   Yes [provider]  Multiple Vitamins-Minerals (PRESERVISION AREDS 2) CAPS Take 1 capsule by mouth daily with breakfast.    Yes [provider]  rosuvastatin (CRESTOR) 40 MG tablet Take 1 tablet (40 mg total) by mouth daily. 09/17/17  Yes Copland, Karleen Hampshire, MD  Tiotropium Bromide-Olodaterol (STIOLTO RESPIMAT) 2.5-2.5 MCG/ACT AERS Inhale 2 puffs into the lungs daily.    Yes [provider]  apixaban (ELIQUIS) 2.5 MG TABS tablet Take 1 tablet (2.5 mg total) by mouth 2 (two) times daily. Patient not taking: Reported on 04/10/2018 03/20/18   Copland, Karleen Hampshire, MD  ONE TOUCH ULTRA TEST test strip USE TO CHECK BLOOD SUGAR 2 TIMES DAILY AS DIRECTED 09/08/15   Copland, Karleen Hampshire, MD  [facility] DELICA LANCETS 33G MISC  02/13/13   [provider]    Physical Exam: Vitals:   04/10/18 1430 04/10/18 1511 04/10/18 1515 04/10/18 1609  BP: (!) 143/47  (!) 141/52 (!) 163/32  Pulse: 65  63 68  Resp: 20  15 16   Temp:      TempSrc:      SpO2: 95% 96% 100% 98%     General:  Appears calm and comfortable and is NAD Eyes:   EOMI, normal lids, iris ENT:  Hard of hearing, normal lips & tongue, mmm Neck:  no LAD, masses or thyromegaly Cardiovascular:  RRR, no m/r/g. No LE edema.  Respiratory:  Mild diffuse interstitial rales with occasional wheezing. Normal respiratory effort. Abdomen:  soft, NT, ND, NABS Skin:  no rash or induration seen on limited exam Musculoskeletal:  grossly normal tone BUE/BLE, good ROM, no bony abnormality Psychiatric:  grossly normal mood and affect, speech fluent and  appropriate, AOx3 Neurologic:  CN 2-12 grossly intact, moves all extremities in coordinated fashion, sensation intact    Radiological Exams on Admission: Dg Chest 2 View  Result Date: 04/10/2018 CLINICAL DATA:  85 year old male with history of tightness in the chest for the past 10 days. EXAM: CHEST - 2 VIEW COMPARISON:  Chest x-ray 11/30/2017. FINDINGS: Diffuse peribronchial cuffing (most severe in the right lung). No acute consolidative airspace disease. No pleural effusions. No evidence of pulmonary edema. Heart size is normal. Upper mediastinal contours are within normal limits. Aortic atherosclerosis. Status post median sternotomy for CABG. Electronic device projecting over the lower left hemithorax. IMPRESSION: 1. Severe diffuse peribronchial cuffing asymmetrically distributed involving the right lung to a greater extent than the left, concerning for an acute bronchitis. 2. Aortic atherosclerosis. Electronically Signed   By: Trudie Reed M.D.   On: 04/10/2018 11:32   Ct Angio Chest Pe W Or Wo Contrast  Result Date: 04/10/2018 CLINICAL DATA:  Chest tightness. EXAM: CT ANGIOGRAPHY CHEST WITH CONTRAST TECHNIQUE: Multidetector CT imaging of the chest was performed using the standard protocol during bolus administration of intravenous contrast. Multiplanar CT image reconstructions and MIPs were obtained to evaluate the vascular anatomy. CONTRAST:  56mL ISOVUE-370 IOPAMIDOL (ISOVUE-370) INJECTION 76% COMPARISON:  Chest x-ray dated 04/10/2018 and chest CTs dated 07/31/2017 and 12/10/2015 FINDINGS: Cardiovascular: No pulmonary emboli. Extensive aortic atherosclerosis and coronary artery calcifications. CABG. Heart size is normal. No pericardial effusion. Stent in the left subclavian artery. Mediastinum/Nodes: No enlarged mediastinal, hilar, or axillary lymph nodes. Thyroid gland, trachea, and esophagus demonstrate no significant findings. Lungs/Pleura: Patient has an extensive faint interstitial  infiltrate involving the right upper lobe and to a lesser degree the right lower lobe. There small patchy areas of new infiltrate in  the left upper lobe and in the superior segment of the left lower lobe. There is a chronic bleb posteriorly in right lower lobe. New tiny right effusion. Upper Abdomen: No acute abnormalities. Stable 15 mm exophytic dense lesion on the posterior aspect of the right kidney consistent with a hyperdense cyst. Musculoskeletal: No acute abnormality. Flowing osteophytes fuse almost the entire thoracic spine. Review of the MIP images confirms the above findings. IMPRESSION: 1. No pulmonary emboli. 2. Extensive new faint interstitial infiltrate involving the right upper lobe and to a lesser degree the right lower lobe. 3. Small new patchy areas of infiltrate in the left upper lobe and superior segment of the left lower lobe. 4.  Aortic Atherosclerosis (ICD10-I70.0). Electronically Signed   By: Francene BoyersJames  Maxwell M.D.   On: 04/10/2018 15:25    EKG: Independently reviewed.  NSR with rate 75; LVH; RBBB; nonspecific ST changes with no evidence of acute ischemia   Labs on Admission: I have personally reviewed the available labs and imaging studies at the time of the admission.  Pertinent labs:   Glucose 201 K+ 5.8 BUN 37/Creatinine 1.80/GFR 33 - generally stable BNP 129.0 Troponin 0.01 WBC 9.9 Hgb 11.6  Assessment/Plan Principal Problem:   COPD with acute exacerbation (HCC) Active Problems:   DM (diabetes mellitus), type 2 with renal complications (HCC)   Essential hypertension   CKD (chronic kidney disease) stage 3, GFR 30-59 ml/min (HCC)   Atrial fibrillation and flutter (HCC)   Acute on chronic respiratory failure associated with a COPD exacerbation -Patient's shortness of breath and productive cough are most likely caused by acute COPD exacerbation.  -However, his symptoms have been going on for about 10 days and are worsening, so this is worrisome for bacterial  infection. -Additionally, CT suggests possible infiltrates. -He does not have fever or leukocytosis. -will observe for now -Nebulizers: scheduled Duoneb and prn albuterol -Solu-Medrol 80 mg IV BID  -IV Rocephin and Doxycycline -Will check procalcitonin level  Afib -Patient admitted for new-onset afib in 8/19 -He was previously on Eliquis but this has been discontinued due to cost -He was in NSR on arrival but has converted to afib by telemetry -He is rate controlled at this time on Toprol XL -Will monitor overnight on telemetry  CKD stage 3 -Appears to be stable at this time -Recheck BMP in AM  HTN -Continue Toprol XL  DM -Reasonably good control on recent A1c -hold Glucotrol -Cover with moderate-scale SSI    DVT prophylaxis: Lovenox  Code Status:  DNR - confirmed with patient Family Communication: None present Disposition Plan:  Home once clinically improved Consults called: CM/PT/OT/Nutrition/RT  Admission status: It is my clinical opinion that referral for OBSERVATION is reasonable and necessary in this patient based on the above information provided. The aforementioned taken together are felt to place the patient at high risk for further clinical deterioration. However it is anticipated that the patient may be medically stable for discharge from the hospital within 24 to 48 hours.     Jonah BlueJennifer Cambreigh Dearing MD [facility]  If note is complete, please contact covering daytime or nighttime physician. [email or web address] Password [email or web address]  04/10/2018, 5:04 PM

## 2018-04-11 DIAGNOSIS — I5042 Chronic combined systolic (congestive) and diastolic (congestive) heart failure: Secondary | ICD-10-CM | POA: Diagnosis not present

## 2018-04-11 DIAGNOSIS — I5030 Unspecified diastolic (congestive) heart failure: Secondary | ICD-10-CM | POA: Diagnosis not present

## 2018-04-11 DIAGNOSIS — J441 Chronic obstructive pulmonary disease with (acute) exacerbation: Secondary | ICD-10-CM | POA: Diagnosis not present

## 2018-04-11 DIAGNOSIS — J449 Chronic obstructive pulmonary disease, unspecified: Secondary | ICD-10-CM | POA: Diagnosis not present

## 2018-04-11 LAB — CBC WITH DIFFERENTIAL/PLATELET
Band Neutrophils: 0 %
Basophils Absolute: 0 10*3/uL (ref 0.0–0.1)
Basophils Relative: 0 %
Blasts: 0 %
EOS PCT: 0 %
Eosinophils Absolute: 0 10*3/uL (ref 0.0–0.5)
HCT: 32.8 % — ABNORMAL LOW (ref 39.0–52.0)
Hemoglobin: 10.4 g/dL — ABNORMAL LOW (ref 13.0–17.0)
Lymphocytes Relative: 11 %
Lymphs Abs: 0.6 10*3/uL — ABNORMAL LOW (ref 0.7–4.0)
MCH: 30.3 pg (ref 26.0–34.0)
MCHC: 31.7 g/dL (ref 30.0–36.0)
MCV: 95.6 fL (ref 80.0–100.0)
Metamyelocytes Relative: 0 %
Monocytes Absolute: 0.2 10*3/uL (ref 0.1–1.0)
Monocytes Relative: 4 %
Myelocytes: 0 %
Neutro Abs: 4.8 10*3/uL (ref 1.7–7.7)
Neutrophils Relative %: 85 %
Other: 0 %
Platelets: 284 10*3/uL (ref 150–400)
Promyelocytes Relative: 0 %
RBC: 3.43 MIL/uL — ABNORMAL LOW (ref 4.22–5.81)
RDW: 11.5 % (ref 11.5–15.5)
WBC: 5.6 10*3/uL (ref 4.0–10.5)
nRBC: 0 % (ref 0.0–0.2)
nRBC: 0 /100 WBC

## 2018-04-11 LAB — COMPREHENSIVE METABOLIC PANEL
ALT: 18 U/L (ref 0–44)
ANION GAP: 13 (ref 5–15)
AST: 15 U/L (ref 15–41)
Albumin: 2.6 g/dL — ABNORMAL LOW (ref 3.5–5.0)
Alkaline Phosphatase: 48 U/L (ref 38–126)
BUN: 46 mg/dL — ABNORMAL HIGH (ref 8–23)
CO2: 21 mmol/L — ABNORMAL LOW (ref 22–32)
Calcium: 9.1 mg/dL (ref 8.9–10.3)
Chloride: 102 mmol/L (ref 98–111)
Creatinine, Ser: 1.96 mg/dL — ABNORMAL HIGH (ref 0.61–1.24)
GFR, EST AFRICAN AMERICAN: 35 mL/min — AB (ref >60)
GFR, EST NON AFRICAN AMERICAN: 30 mL/min — AB (ref >60)
Glucose, Bld: 326 mg/dL — ABNORMAL HIGH (ref 70–99)
Potassium: 5.1 mmol/L (ref 3.5–5.1)
Sodium: 136 mmol/L (ref 135–145)
Total Bilirubin: 0.1 mg/dL — ABNORMAL LOW (ref 0.3–1.2)
Total Protein: 6.4 g/dL — ABNORMAL LOW (ref 6.5–8.1)

## 2018-04-11 LAB — GLUCOSE, CAPILLARY
Glucose-Capillary: 313 mg/dL — ABNORMAL HIGH (ref 70–99)
Glucose-Capillary: 329 mg/dL — ABNORMAL HIGH (ref 70–99)
Glucose-Capillary: 227 mg/dL — ABNORMAL HIGH (ref 70–99)
Glucose-Capillary: 179 mg/dL — ABNORMAL HIGH (ref 70–99)

## 2018-04-11 LAB — MAGNESIUM: MAGNESIUM: 2.1 mg/dL (ref 1.7–2.4)

## 2018-04-11 MED ORDER — PREDNISONE 20 MG PO TABS
50.0000 mg | ORAL_TABLET | Freq: Every day | ORAL | Status: DC
  Administered 2018-04-12: 50 mg via ORAL
  Filled 2018-04-11: qty 2

## 2018-04-11 MED ORDER — IPRATROPIUM-ALBUTEROL 0.5-2.5 (3) MG/3ML IN SOLN
3.0000 mL | Freq: Two times a day (BID) | RESPIRATORY_TRACT | Status: DC
  Administered 2018-04-11: 3 mL via RESPIRATORY_TRACT
  Filled 2018-04-11: qty 3

## 2018-04-11 MED ORDER — ENOXAPARIN SODIUM 30 MG/0.3ML ~~LOC~~ SOLN
30.0000 mg | SUBCUTANEOUS | Status: DC
  Administered 2018-04-11: 30 mg via SUBCUTANEOUS
  Filled 2018-04-11: qty 0.3

## 2018-04-11 NOTE — Progress Notes (Signed)
Nutrition Consult/Brief Note  RD consulted via Inpatient COPD Exacerbation protocol.  Wt Readings from Last 15 Encounters:  04/11/18 83.2 kg  03/20/18 83.2 kg  12/21/17 79.6 kg  12/12/17 81.1 kg  12/05/17 83.1 kg  09/17/17 82.4 kg  09/10/17 82 kg  08/08/17 83.5 kg  08/01/17 81.7 kg  12/25/16 79.9 kg  12/20/16 79.8 kg  11/20/16 78.5 kg  11/15/16 80.6 kg  10/31/16 80.4 kg  10/25/16 81.2 kg   Body mass index is 29.61 kg/m. Patient meets criteria for Overweight based on current BMI.   Current diet order is Heart Healthy/Carbohydrate Modified, patient is consuming approximately 100% of meals at this time. Labs and medications reviewed.   No nutrition interventions warranted at this time. If nutrition issues arise, please consult RD.   Maureen Chatters, RD, LDN Pager #: [phone] After-Hours Pager #: [phone]

## 2018-04-11 NOTE — Care Management Note (Signed)
Case Management Note  Patient Details  Name: Timothy Cordova MRN: [id number] Date of Birth: [birth year]  Subjective/Objective:    Pt admitted with COPD. He is from home with his spouse.  No issues with obtaining his medications and no issues with transportation.                Action/Plan: CM consulted for home oxygen and HH services. CM met with the patient and wife and provided choice. They would like to use [facility] for oxygen and [facility] for [facility] services. MD please place [place] orders and F2F.  CM notified Jermaine with [facility] DME of the oxygen and Tiffany with [facility].  Wife will provide transport home when medically ready.   Expected Discharge Date:                  Expected Discharge Plan:  [place]  In-House Referral:     Discharge planning Services  CM Consult  Post Acute Care Choice:  Durable Medical Equipment, Home Health Choice offered to:  Patient, Spouse  DME Arranged:  Oxygen DME Agency:  [place]:  PT [place]:  [facility])  Status of Service:  Completed, signed off  If discussed at [employer] of Stay Meetings, dates discussed:    Additional Comments:  Pollie Friar, RN 04/11/2018, 1:28 PM

## 2018-04-11 NOTE — Progress Notes (Signed)
Inpatient Diabetes Program Recommendations  AACE/ADA: New Consensus Statement on Inpatient Glycemic Control (2015)  Target Ranges:  Prepandial:   less than 140 mg/dL      Peak postprandial:   less than 180 mg/dL (1-2 hours)      Critically ill patients:  140 - 180 mg/dL   Lab Results  Component Value Date   GLUCAP 313 (H) 04/11/2018   HGBA1C 7.4 (H) 03/20/2018    Review of Glycemic Control  Diabetes history: Type 2 Outpatient Diabetes medications: Glucotrol Current orders for Inpatient glycemic control: Novolog MODERATE (0-15 units) TID & HS scale  Inpatient Diabetes Program Recommendations:    Noted that patient's blood sugars have been greater than 200 mg/dl.  Recommend adding Lantus 15 units daily if blood sugars continue to be elevated and while on steroids. Continue Novolog correction scale as ordered. Will continue to monitor blood sugars while in the hospital.  Smith Mince RN BSN CDE Diabetes Coordinator Pager: [phone]  8am-5pm

## 2018-04-11 NOTE — Progress Notes (Signed)
Physical Therapy Evaluation Patient Details Name: Timothy Cordova MRN: [id number] DOB: 1929-01-15 Today's Date: 04/11/2018   History of Present Illness  Timothy Cordova is a 85 y.o. male with medical history significant of OSA; HLD; HTN; DM; dementia; CAD s/p CABG; COPD; dementia; CKD; and chronic diastolic CHF presenting with chest pain.   Patient reports that he has had SOB.     Clinical Impression  Pt admitted with above diagnosis. Pt currently with functional limitations due to the deficits listed below (see PT Problem List). PTA pt livinga t home with wife, drives ambulates without AD, denies recent falls. Today pt ambulating with supervision on RA, DOE 2/4 by 150', SpO2 88% on RA. Pt agreeable to HHPT to improve tolerance. Pt will benefit from skilled PT to increase their independence and safety with mobility to allow discharge to the venue listed below.       Follow Up Recommendations Home health PT;Supervision for mobility/OOB    Equipment Recommendations  None recommended by PT    Recommendations for Other Services       Precautions / Restrictions Precautions Precautions: Fall      Mobility  Bed Mobility               General bed mobility comments: oob in chair at entry  Transfers Overall transfer level: Modified independent                  Ambulation/Gait Ambulation/Gait assistance: Supervision Gait Distance (Feet): 160 Feet Assistive device: None Gait Pattern/deviations: WFL(Within Functional Limits) Gait velocity: normal for age   General Gait Details: pt ambulating with good mechanics and balance, no overt LOB, de sat on RA to 88%, DOE 2/4 by end of session  Stairs            Wheelchair Mobility    Modified Rankin (Stroke Patients Only)       Balance Overall balance assessment: Mild deficits observed, not formally tested                                           Pertinent Vitals/Pain Pain Assessment:  No/denies pain    Home Living Family/patient expects to be discharged to:: Private residence Living Arrangements: Spouse/significant other Available Help at Discharge: Family;Available 24 hours/day Type of Home: House Home Access: Ramped entrance     Home Layout: Two level;Able to live on main level with bedroom/bathroom Home Equipment: Dan Humphreys - 2 wheels;Walker - 4 wheels;Cane - single point;Bedside commode;Shower seat;Grab bars - toilet;Grab bars - tub/shower      Prior Function Level of Independence: Independent         Comments: pt denies reecent falls.      Hand Dominance   Dominant Hand: Right    Extremity/Trunk Assessment   Upper Extremity Assessment Upper Extremity Assessment: Overall WFL for tasks assessed    Lower Extremity Assessment Lower Extremity Assessment: Overall WFL for tasks assessed    Cervical / Trunk Assessment Cervical / Trunk Assessment: Normal  Communication   Communication: No difficulties  Cognition Arousal/Alertness: Awake/alert                                            General Comments      Exercises     Assessment/Plan  PT Assessment Patient needs continued PT services  PT Problem List Decreased activity tolerance       PT Treatment Interventions DME instruction;Gait training;Functional mobility training;Stair training;Therapeutic activities;Therapeutic exercise    PT Goals (Current goals can be found in the Care Plan section)  Acute Rehab PT Goals Patient Stated Goal: go home PT Goal Formulation: With patient Time For Goal Achievement: 04/25/18 Potential to Achieve Goals: Good    Frequency Min 3X/week   Barriers to discharge        Co-evaluation               AM-PAC PT "6 Clicks" Mobility  Outcome Measure Help needed turning from your back to your side while in a flat bed without using bedrails?: None Help needed moving from lying on your back to sitting on the side of a flat bed  without using bedrails?: None Help needed moving to and from a bed to a chair (including a wheelchair)?: None Help needed standing up from a chair using your arms (e.g., wheelchair or bedside chair)?: A Little Help needed to walk in hospital room?: A Little Help needed climbing 3-5 steps with a railing? : A Lot 6 Click Score: 20    End of Session Equipment Utilized During Treatment: Gait belt Activity Tolerance: Patient tolerated treatment well Patient left: in chair;with call bell/phone within reach;with family/visitor present Nurse Communication: Mobility status PT Visit Diagnosis: Muscle weakness (generalized) (M62.81)    Time: 1220-1240 PT Time Calculation (min) (ACUTE ONLY): 20 min   Charges:   PT Evaluation $PT Eval Low Complexity: 1 Low          Timothy Cordova, PT, DPT Acute Rehabilitation Services Pager: [phone] Office: [phone]    Timothy Cordova 04/11/2018, 12:54 PM

## 2018-04-11 NOTE — Evaluation (Signed)
Occupational Therapy Evaluation Patient Details Name: Timothy Cordova Holts MRN: [id number] DOB: 1928/12/30 Today's Date: 04/11/2018    History of Present Illness Timothy Cordova Ghan is a 85 y.[place]. male with medical history significant of OSA; HLD; HTN; DM; dementia; CAD s/p CABG; COPD; dementia; CKD; and chronic diastolic CHF presenting with chest pain.   Patient reports that he has had SOB.    Clinical Impression   Pt admitted with the above diagnoses and presents with below problem list. Pt will benefit from continued acute OT to address the below listed deficits and maximize independence with basic ADLs prior to d/c home. PTA pt was independent with ADLs. Pt is currently supervision to min guard with LB ADLs and functional transfers/mobility. Spouse present throughout session. Some SOB noted at end of session while pt talking otherwise tolerated session well.     Follow Up Recommendations  No OT follow up;Supervision - Intermittent    Equipment Recommendations  None recommended by OT    Recommendations for Other Services       Precautions / Restrictions Precautions Precautions: Fall      Mobility Bed Mobility               General bed mobility comments: oob in chair at entry  Transfers Overall transfer level: Modified independent                    Balance Overall balance assessment: Mild deficits observed, not formally tested                                         ADL either performed or assessed with clinical judgement   ADL Overall ADL's : Needs assistance/impaired Eating/Feeding: Set up;Sitting   Grooming: Wash/dry hands;Supervision/safety;Standing   Upper Body Bathing: Set up;Sitting   Lower Body Bathing: Supervison/ safety;Sit to/from stand;Min guard   Upper Body Dressing : Set up;Sitting   Lower Body Dressing: Supervision/safety;Sit to/from stand;Min guard   Toilet Transfer: Supervision/safety;Ambulation   Toileting-  Clothing Manipulation and Hygiene: Supervision/safety;Sit to/from stand   Tub/ Shower Transfer: Walk-in shower;Supervision/safety;Ambulation;Grab bars;Shower seat   Functional mobility during ADLs: Supervision/safety General ADL Comments: Pt completed toilet and shower transfers, grooming task standing at sink, and in-room mobility.     Vision Baseline Vision/History: Wears glasses       Perception     Praxis      Pertinent Vitals/Pain Pain Assessment: No/denies pain     Hand Dominance Right   Extremity/Trunk Assessment Upper Extremity Assessment Upper Extremity Assessment: Overall WFL for tasks assessed   Lower Extremity Assessment Lower Extremity Assessment: Defer to PT evaluation   Cervical / Trunk Assessment Cervical / Trunk Assessment: Normal   Communication Communication Communication: No difficulties   Cognition Arousal/Alertness: Awake/alert Behavior During Therapy: WFL for tasks assessed/performed Overall Cognitive Status: Within Functional Limits for tasks assessed                                     General Comments  Spouse present during session. Pt reports he assists wife at home as needed.    Exercises     Shoulder Instructions      Home Living Family/patient expects to be discharged to:: Private residence Living Arrangements: Spouse/significant other Available Help at Discharge: Family;Available 24 hours/day Type of Home: House Home Access: Ramped  entrance     Home Layout: Two level;Able to live on main level with bedroom/bathroom     Bathroom Shower/Tub: Producer, television/film/video: Handicapped height Bathroom Accessibility: Yes   Home Equipment: Environmental consultant - 2 wheels;Walker - 4 wheels;Cane - single point;Bedside commode;Shower seat;Grab bars - toilet;Grab bars - tub/shower          Prior Functioning/Environment Level of Independence: Independent        Comments: pt denies reecent falls.         OT Problem  List: Impaired balance (sitting and/or standing);Decreased knowledge of precautions;Decreased knowledge of use of DME or AE;Cardiopulmonary status limiting activity;Decreased activity tolerance      OT Treatment/Interventions: Self-care/ADL training;Energy conservation;DME and/or AE instruction;Therapeutic activities;Patient/family education;Balance training    OT Goals(Current goals can be found in the care plan section) Acute Rehab OT Goals Patient Stated Goal: go home OT Goal Formulation: With patient/family Time For Goal Achievement: 04/18/18 Potential to Achieve Goals: Good ADL Goals Pt Will Perform Grooming: with modified independence;standing Pt Will Perform Lower Body Bathing: sit to/from stand;with modified independence Pt Will Perform Lower Body Dressing: with modified independence;sit to/from stand  OT Frequency: Min 2X/week   Barriers to D/C:            Co-evaluation              AM-PAC OT "6 Clicks" Daily Activity     Outcome Measure Help from another person eating meals?: None Help from another person taking care of personal grooming?: None Help from another person toileting, which includes using toliet, bedpan, or urinal?: None Help from another person bathing (including washing, rinsing, drying)?: A Little Help from another person to put on and taking off regular upper body clothing?: None Help from another person to put on and taking off regular lower body clothing?: None 6 Click Score: 23   End of Session    Activity Tolerance: Patient tolerated treatment well;Other (comment)(some SOB noted at end of session while talking) Patient left: in chair;with call bell/phone within reach;with family/visitor present  OT Visit Diagnosis: Other abnormalities of gait and mobility (R26.89)                Time: 1250-1309 OT Time Calculation (min): 19 min Charges:  OT General Charges $OT Visit: 1 Visit OT Evaluation $OT Eval Low Complexity: 1 Low  Raynald Kemp, OT Acute Rehabilitation Services Pager: [phone] Office: [phone]   Pilar Grammes 04/11/2018, 1:13 PM

## 2018-04-11 NOTE — Progress Notes (Signed)
SATURATION QUALIFICATIONS: (This note is used to comply with regulatory documentation for home oxygen)  Patient Saturations on Room Air at Rest = 93%  Patient Saturations on Room Air while Ambulating = 87%  Patient Saturations on 2 Liters of oxygen while Ambulating = 93%  Please briefly explain why patient needs home oxygen: O2 Saturation dropped to 87% with minimal exertion on RA. Patient complains of shortness of breath with any activity.

## 2018-04-11 NOTE — Progress Notes (Signed)
[facility] Progress Note  Patient: Timothy Cordova TDD:[id number]   PCP: Hannah Beat, MD DOB: 1928-11-10   DOA: 04/10/2018   DOS: 04/11/2018   Date of Service: the patient was seen and examined on 04/11/2018  Brief hospital course: Pt. with PMH of OSA, HLD, HTN, DM, dementia, CAD S/P CABG, COPD, CKD, chronic diastolic CHF; admitted on 04/10/2018, presented with complaint of shortness of breath, was found to have COPD exacerbation due to multifocal pneumonia. Currently further plan is continue antibiotics.  Subjective: Feeling better.  No nausea no vomiting.  Cough still present but breathing is still heavy.  No diarrhea no constipation.  Oral intake is better.  Assessment and Plan: Acute on chronic respiratory failure associated with a COPD exacerbation -Patient's shortness of breath and productive cough are most likely caused by acute COPD exacerbation.  -However, his symptoms have been going on for about 10 days and are worsening, so this is worrisome for bacterial infection. -Additionally, CT suggests possible infiltrates. -He does not have fever or leukocytosis. -will observe for now, still hypoxic and has occasional wheezing. -Nebulizers: scheduled Duoneb and prn albuterol -Switch Solu-Medrol to oral prednisone.  Switch IV antibiotics to oral doxycycline only.  Afib -Patient admitted for new-onset afib in 8/19 -He was previously on Eliquis but he has stopped taking it due to cost -He was in NSR on arrival but has converted to afib by telemetry -He is rate controlled at this time on Toprol XL -Will recommend outpatient follow-up with PCP as well as cardiology, consider Coumadin since DOAC has prohibitive cost for the patient.  CKD stage 3 -Appears to be stable at this time Closely monitoring.  HTN -Continue Toprol XL  Type 2 diabetes mellitus, uncontrolled with hyperglycemia, with renal complication -Reasonably good control on recent A1c Hyperglycemic here  secondary to steroids. Continue sliding scale insulin.   Diet: careaic diet DVT Prophylaxis: subcutaneous Heparin  Advance goals of care discussion: full code  Family Communication: no family was present at bedside, at the time of interview.   Disposition:  Discharge to home tmorrow.  Consultants: none Procedures: none  Scheduled Meds: . aspirin EC  81 mg Oral Daily  . doxazosin  4 mg Oral QHS  . doxycycline  100 mg Oral Q12H  . enoxaparin (LOVENOX) injection  30 mg Subcutaneous Q24H  . insulin aspart  0-15 Units Subcutaneous TID WC  . insulin aspart  0-5 Units Subcutaneous QHS  . ipratropium-albuterol  3 mL Nebulization Once  . metoprolol  100 mg Oral Daily  . [START ON 04/12/2018] predniSONE  50 mg Oral Q breakfast  . rosuvastatin  40 mg Oral Daily  . sodium chloride flush  3 mL Intravenous Q12H   Continuous Infusions: PRN Meds: albuterol Antibiotics: Anti-infectives (From admission, onward)   Start     Dose/Rate Route Frequency Ordered Stop   04/11/18 2200  cefTRIAXone (ROCEPHIN) 2 g in sodium chloride 0.9 % 100 mL IVPB  Status:  Discontinued     2 g 200 mL/hr over 30 Minutes Intravenous Every 24 hours 04/10/18 1659 04/11/18 1029   04/11/18 0600  doxycycline (VIBRA-TABS) tablet 100 mg     100 mg Oral Every 12 hours 04/10/18 1659     04/10/18 1730  doxycycline (VIBRAMYCIN) 100 mg in sodium chloride 0.9 % 250 mL IVPB     100 mg 125 mL/hr over 120 Minutes Intravenous  Once 04/10/18 1624 04/10/18 2120   04/10/18 1630  cefTRIAXone (ROCEPHIN) 2 g in sodium chloride 0.9 %  100 mL IVPB     2 g 200 mL/hr over 30 Minutes Intravenous  Once 04/10/18 1624 04/10/18 2130       Objective: Physical Exam: Vitals:   04/10/18 2337 04/11/18 0754 04/11/18 0755 04/11/18 1009  BP: (!) 146/52 (!) 146/66    Pulse: 72 66    Resp: (!) 26 20    Temp: (!) 97.5 F (36.4 C) 97.6 F (36.4 C)    TempSrc: Oral Oral    SpO2: 94% 95% 93%   Weight:    83.2 kg  Height:    5\' 6"  (1.676 m)     Intake/Output Summary (Last 24 hours) at 04/11/2018 1327 Last data filed at 04/11/2018 1253 Gross per 24 hour  Intake 280 ml  Output 650 ml  Net -370 ml   Filed Weights   04/11/18 1009  Weight: 83.2 kg   General: Alert, Awake and Oriented to Time, Place and Person. Appear in mild distress, affect appropriate Eyes: PERRL, Conjunctiva normal ENT: Oral Mucosa clear moist. Neck: no JVD, no Abnormal Mass Or lumps Cardiovascular: S1 and S2 Present, no Murmur, Peripheral Pulses Present Respiratory: normal respiratory effort, Bilateral Air entry equal and Decreased, no use of accessory muscle basal Crackles, Occasional  wheezes Abdomen: Bowel Sound present, Soft and no tenderness, no hernia Skin: no redness, no Rash, no induration Extremities: no Pedal edema, no calf tenderness Neurologic: Grossly no focal neuro deficit. Bilaterally Equal motor strength  Data Reviewed: CBC: Recent Labs  Lab 04/10/18 1047 04/11/18 0712  WBC 9.9 5.6  NEUTROABS  --  4.8  HGB 11.6* 10.4*  HCT 38.4* 32.8*  MCV 99.0 95.6  PLT 354 284   Basic Metabolic Panel: Recent Labs  Lab 04/10/18 1047 04/11/18 0712  NA 137 136  K 5.8* 5.1  CL 102 102  CO2 24 21*  GLUCOSE 201* 326*  BUN 37* 46*  CREATININE 1.80* 1.96*  CALCIUM 9.8 9.1  MG  --  2.1    Liver Function Tests: Recent Labs  Lab 04/11/18 0712  AST 15  ALT 18  ALKPHOS 48  BILITOT 0.1*  PROT 6.4*  ALBUMIN 2.6*   No results for input(s): LIPASE, AMYLASE in the last 168 hours. No results for input(s): AMMONIA in the last 168 hours. Coagulation Profile: No results for input(s): INR, PROTIME in the last 168 hours. Cardiac Enzymes: No results for input(s): CKTOTAL, CKMB, CKMBINDEX, TROPONINI in the last 168 hours. BNP (last 3 results) No results for input(s): PROBNP in the last 8760 hours. CBG: Recent Labs  Lab 04/10/18 1926 04/10/18 2203 04/11/18 0752 04/11/18 1158  GLUCAP 215* 393* 313* 329*   Studies: Ct Angio Chest Pe W Or  Wo Contrast  Result Date: 04/10/2018 CLINICAL DATA:  Chest tightness. EXAM: CT ANGIOGRAPHY CHEST WITH CONTRAST TECHNIQUE: Multidetector CT imaging of the chest was performed using the standard protocol during bolus administration of intravenous contrast. Multiplanar CT image reconstructions and MIPs were obtained to evaluate the vascular anatomy. CONTRAST:  24mL ISOVUE-370 IOPAMIDOL (ISOVUE-370) INJECTION 76% COMPARISON:  Chest x-ray dated 04/10/2018 and chest CTs dated 07/31/2017 and 12/10/2015 FINDINGS: Cardiovascular: No pulmonary emboli. Extensive aortic atherosclerosis and coronary artery calcifications. CABG. Heart size is normal. No pericardial effusion. Stent in the left subclavian artery. Mediastinum/Nodes: No enlarged mediastinal, hilar, or axillary lymph nodes. Thyroid gland, trachea, and esophagus demonstrate no significant findings. Lungs/Pleura: Patient has an extensive faint interstitial infiltrate involving the right upper lobe and to a lesser degree the right lower lobe. There small patchy  areas of new infiltrate in the left upper lobe and in the superior segment of the left lower lobe. There is a chronic bleb posteriorly in right lower lobe. New tiny right effusion. Upper Abdomen: No acute abnormalities. Stable 15 mm exophytic dense lesion on the posterior aspect of the right kidney consistent with a hyperdense cyst. Musculoskeletal: No acute abnormality. Flowing osteophytes fuse almost the entire thoracic spine. Review of the MIP images confirms the above findings. IMPRESSION: 1. No pulmonary emboli. 2. Extensive new faint interstitial infiltrate involving the right upper lobe and to a lesser degree the right lower lobe. 3. Small new patchy areas of infiltrate in the left upper lobe and superior segment of the left lower lobe. 4.  Aortic Atherosclerosis (ICD10-I70.0). Electronically Signed   By: Francene BoyersJames  Maxwell M.D.   On: 04/10/2018 15:25     Time spent: 35 minutes  Author: Lynden OxfordPranav Dhaval Woo,  MD [facility] Pager: [phone] 04/11/2018 1:27 PM  Between 7PM-7AM, please contact night-coverage at [email or web address], password [email or web address]

## 2018-04-12 ENCOUNTER — Telehealth: Payer: Self-pay | Admitting: Family Medicine

## 2018-04-12 DIAGNOSIS — J441 Chronic obstructive pulmonary disease with (acute) exacerbation: Secondary | ICD-10-CM | POA: Diagnosis not present

## 2018-04-12 MED ORDER — FUROSEMIDE 20 MG PO TABS: 20.0000 mg | ORAL_TABLET | Freq: Every day | ORAL | Status: DC | PRN

## 2018-04-12 MED ORDER — FLUTICASONE PROPIONATE HFA 110 MCG/ACT IN AERO: 2.0000 | INHALATION_SPRAY | Freq: Two times a day (BID) | RESPIRATORY_TRACT | 0 refills | Status: DC

## 2018-04-12 MED ORDER — ALBUTEROL SULFATE HFA 108 (90 BASE) MCG/ACT IN AERS: 2.0000 | INHALATION_SPRAY | Freq: Four times a day (QID) | RESPIRATORY_TRACT | 0 refills | Status: AC | PRN

## 2018-04-12 MED ORDER — PREDNISONE 10 MG PO TABS: ORAL_TABLET | ORAL | 0 refills | Status: DC

## 2018-04-12 MED ORDER — DOXYCYCLINE HYCLATE 100 MG PO TABS: 100.0000 mg | ORAL_TABLET | Freq: Two times a day (BID) | ORAL | 0 refills | Status: AC

## 2018-04-12 NOTE — Care Management Note (Signed)
Case Management Note  Patient Details  Name: Timothy Cordova MRN: [id number] Date of Birth: Jul 25, 1928  Subjective/Objective:   Pt admitted with COPD. He is from home with his spouse.  No issues with obtaining his medications and no issues with transportation.   HHRN was added for COPD disease management.  Tiffany with [facility] notified for dc .                   Action/Plan: CM consulted for home oxygen and HH services. CM met with the patient and wife and provided choice. They would like to use [facility] for oxygen and [facility] for [facility] services. MD please place [place] orders and F2F.  CM notified Jermaine with [facility] DME of the oxygen and Tiffany with [facility].  Wife will provide transport home when medically ready.    Expected Discharge Date:  04/12/18               Expected Discharge Plan:  [place]  In-House Referral:     Discharge planning Services  CM Consult  Post Acute Care Choice:  Durable Medical Equipment, Home Health Choice offered to:  Patient, Spouse  DME Arranged:  Oxygen DME Agency:  [place]:  PT, RN, Disease Management [place] Agency:  [facility] ([facility])  Status of Service:  Completed, signed off  If discussed at [employer] of Stay Meetings, dates discussed:    Additional Comments:  Zenon Mayo, RN 04/12/2018, 10:00 AM

## 2018-04-12 NOTE — Telephone Encounter (Signed)
Transition Care Management Follow-up Telephone Call   Date discharged? 04/12/2018   How have you been since you were released from the hospital? Patient is doing better. Patient is still taking Prednisone. Able to eat and drink well.   Do you understand why you were in the hospital? Yes   Do you understand the discharge instructions? Yes   Where were you discharged to? Home   Items Reviewed:  Medications reviewed: Yes-need to go over the changes of the medications during his appointment on 04/15/2018.  Allergies reviewed:Yes  Dietary changes reviewed: Yes  Referrals reviewed: [facility] services are ordered.   Functional Questionnaire:   Activities of Daily Living (ADLs):   He states they are independent in the following:  Ambulation, feeding, grooming, dressing, hygiene, bathing, toileting. States they require assistance with the following: none   Any transportation issues/concerns?: No.   Any patient concerns? Not at this time.   Confirmed importance and date/time of follow-up visits scheduled  yes  Provider Appointment booked with Dr. Patsy Lager on 04/15/2018 at 10:20 am.  Confirmed with patient if condition begins to worsen call PCP or go to the ER.  Patient was given the office number and encouraged to call back with question or concerns.  : yes

## 2018-04-12 NOTE — Telephone Encounter (Signed)
Pt called office due to being discharged from [facility] today for respiratory problems today. He was told to schedule a hospital follow up visit with Dr.Copland but I did not see anything available until 05/08/18. Please advise when we can get him in

## 2018-04-12 NOTE — Telephone Encounter (Signed)
Can you help with this?

## 2018-04-12 NOTE — Telephone Encounter (Signed)
I can see him on 10:20 on Monday morning.   Ideally one of the RN's or CMA's can call him for f/u phone call.

## 2018-04-14 NOTE — Progress Notes (Signed)
Dr. Karleen HampshireSpencer T. Cayleen Benjamin, MD, CAQ [facility] [address] [place], [id number] Phone: [phone] Fax: [phone]  04/15/2018  Patient: Timothy Cordova, MRN: [id number], DOB: 05/03/1928, 85 y.o.  Primary Physician:  Hannah Beatopland, Dawn Convery, MD   Chief Complaint  Patient presents with  . Hospitalization Follow-up   Subjective:   Timothy Postinhomas O Ginther is a 85 y.o. very pleasant male patient who presents with the following:  [facility] f/u notes  Admitted: 04/10/2018 Discharged: 04/12/2018  No discharge note is available.   Admitted with COPD exacerbation and multifocal pneumonia.  D/c on oral prednisone and doxycycline after solu-medrol in the hospital.  A fib: he has stopped his eliquis again.  Taking [facility] asked him to stop his beta blocker He has known CAD s/p CABG  Intolerant to Coumadin in the past. Dr. Riley KillStuckey put him on this many years ago.   Past Medical History, Surgical History, Social History, Family History, Problem List, Medications, and Allergies have been reviewed and updated if relevant.  Patient Active Problem List   Diagnosis Date Noted  . CKD (chronic kidney disease) stage 3, GFR 30-59 ml/min (HCC) 08/18/2010    Priority: High  . DIASTOLIC HEART FAILURE, CHRONIC 08/18/2009    Priority: High  . CAD, AUTOLOGOUS BYPASS GRAFT 06/07/2008    Priority: High  . DM (diabetes mellitus), type 2 with renal complications (HCC) 09/04/2007    Priority: High  . Centrilobular emphysema (HCC) 08/08/2005    Priority: High  . Atrial fibrillation and flutter (HCC)   . COPD (chronic obstructive pulmonary disease) (HCC) 07/30/2017  . Pulmonary HTN (HCC) 07/30/2017  . Solitary pulmonary nodule 12/10/2015  . Anemia 11/26/2014  . COPD with acute exacerbation (HCC) 05/21/2013  . Aortic calcification, 04/20/2005 CT Chest 04/03/2013  . Sleep apnea   . Tobacco abuse (in remission)   . Chronic low back pain 04/18/2011  . Renal lesion  03/27/2011  . OSTEOARTHRITIS 12/06/2009  . Mixed hyperlipidemia 06/11/2008  . ALLERGIC RHINITIS 01/30/2007  . HEARING LOSS 07/06/2006  . Essential hypertension 07/06/2006  . BPH (benign prostatic hyperplasia) 07/06/2006    Past Medical History:  Diagnosis Date  . Allergic rhinitis   . Benign prostatic hypertrophy   . Chronic diastolic heart failure (HCC)    8/08 ECHO with EF 55%  . CKD (chronic kidney disease)   . COPD (chronic obstructive pulmonary disease) (HCC)    moderate. followed by Dr.Byrum  . Coronary artery disease    s/p CABG 2006. LHC (1/10): SVG-OM and PLOM, SVG-D, and LIMA-LAD were all patent  . Dementia (HCC)    (mild)--06/2006  . Diabetes mellitus   . Edema    localized. suspect diastolic CHF + venous insufficiency  . Hearing loss    left >>right  . History of tobacco abuse   . Hypertension   . Mixed hyperlipidemia   . Osteoarthritis   . Pneumonia 07/31/2017  . Sleep apnea    no CPAP    Past Surgical History:  Procedure Laterality Date  . ANGIOPLASTY  [id number]  . CARDIAC [place]  [place]  . CARDIAC CATHETERIZATION     [facility];x2 stents  . CARDIAC CATHETERIZATION  04/29/2013  . CARDIOVERSION N/A 12/05/2017   Procedure: CARDIOVERSION;  Surgeon: Elease HashimotoNahser, Deloris PingPhilip J, MD;  [facility];  Service: Cardiovascular;  Laterality: N/A;  . CORONARY ARTERY BYPASS GRAFT  02/2005  . ROTATOR CUFF REPAIR  2005   left, Gioffre  .  TEE WITHOUT CARDIOVERSION N/A 12/05/2017   Procedure: TRANSESOPHAGEAL ECHOCARDIOGRAM (TEE);  Surgeon: Elease Hashimoto Deloris Ping, MD;  Location: [facility] ENDOSCOPY;  Service: Cardiovascular;  Laterality: N/A;    Social History   Socioeconomic History  . Marital status: Married    Spouse name: Not on file  . Number of children: Not on file  . Years of education: Not on file  . Highest education level: Not on file  Occupational History  . Occupation: Research officer, trade union: RETIRED    Comment: RETIRED  Social  Needs  . Financial resource strain: Not on file  . Food insecurity:    Worry: Not on file    Inability: Not on file  . Transportation needs:    Medical: Not on file    Non-medical: Not on file  Tobacco Use  . Smoking status: Former Smoker    Packs/day: 3.00    Years: 60.00    Pack years: 180.00    Types: Cigarettes    Last attempt to quit: 07/12/2005    Years since quitting: 12.7  . Smokeless tobacco: Never Used  Substance and Sexual Activity  . Alcohol use: No    Alcohol/week: 0.0 standard drinks  . Drug use: No  . Sexual activity: Not on file  Lifestyle  . Physical activity:    Days per week: Not on file    Minutes per session: Not on file  . Stress: Not on file  Relationships  . Social connections:    Talks on phone: Not on file    Gets together: Not on file    Attends religious service: Not on file    Active member of club or organization: Not on file    Attends meetings of clubs or organizations: Not on file    Relationship status: Not on file  . Intimate partner violence:    Fear of current or ex partner: Not on file    Emotionally abused: Not on file    Physically abused: Not on file    Forced sexual activity: Not on file  Other Topics Concern  . Not on file  Social History Narrative  . Not on file    Family History  Problem Relation Age of Onset  . Heart attack Mother   . Lung cancer Sister   . Prostate cancer Neg Hx   . Kidney cancer Neg Hx     Allergies  Allergen Reactions  . Metformin And Related Nausea And Vomiting and Rash  . Tramadol Hcl Other (See Comments)    Tremor vs seizure activity  . Codeine Nausea And Vomiting  . Gabapentin Other (See Comments)    "Messed up my nervous system"  . Morphine Sulfate Nausea And Vomiting  . Other Nausea And Vomiting    "Narcotics, in general"  . Ticlopidine Hcl Other (See Comments)    Reaction unknown by patient or family  . Warfarin Sodium Nausea And Vomiting and Other (See Comments)    "Severe  fatigue," also  . Ace Inhibitors Rash and Cough  . Tramadol Rash    Medication list reviewed and updated in full in [facility].   GEN: No acute illnesses, no fevers, chills. GI: No n/v/d, eating normally Pulm: No SOB Interactive and getting along well at home.  Otherwise, ROS is as per the HPI.  Objective:   BP 120/60   Pulse 63   Temp (!) 97.4 F (36.3 C) (Oral)   Ht 5\' 6"  (1.676 m)   Wt  179 lb 4 oz (81.3 kg)   SpO2 96%   BMI 28.93 kg/m   GEN: WDWN, NAD, Non-toxic, A & O x 3 HEENT: Atraumatic, Normocephalic. Neck supple. No masses, No LAD. Ears and Nose: No external deformity. CV: RRR, No M/G/R. No JVD. No thrill. No extra heart sounds. PULM: CTA B, rare wheezes, no crackles, rhonchi. No retractions. No resp. distress. No accessory muscle use. EXTR: No c/c/e NEURO Normal gait.  PSYCH: Normally interactive. Conversant. Not depressed or anxious appearing.  Calm demeanor.   Laboratory and Imaging Data:  Assessment and Plan:   COPD with acute exacerbation (HCC)  Acute respiratory failure with hypoxia (HCC)  Aortic calcification, 04/20/2005 CT Chest  CKD (chronic kidney disease) stage 3, GFR 30-59 ml/min (HCC)  Coronary atherosclerosis of autologous vein bypass graft without angina  Type 2 diabetes mellitus with diabetic nephropathy, without long-term current use of insulin (HCC)  Lungs are fairly clear today.  He feels much better after his multifocal pneumonia, and he is currently taking doxycycline as well as prednisone.  He is taking his inhalers as well.  From a cardiac standpoint, there is no discharge summary, but it appears that they stopped his metoprolol.  He does have coronary disease, he is post CABG, and he also has A. fib.  I am going to restart his metoprolol at 100 mg.  He is currently on aspirin.  He has been unable to afford Eliquis or any of the other modern anticoagulants.  He is intolerant of Coumadin for many years ago, and he declines to  go on 80 further anticoagulation above aspirin.  He understands that this does increase some risk for having a stroke.  Follow-up: regular only  Meds ordered this encounter  Medications  . metoprolol (TOPROL-XL) 200 MG 24 hr tablet    Sig: Take 0.5 tablets (100 mg total) by mouth daily.    Dispense:  1 tablet    Refill:  0   Signed,  Edris Friedt T. Cale Bethard, MD   Outpatient Encounter Medications as of 04/15/2018  Medication Sig  . albuterol (PROVENTIL HFA;VENTOLIN HFA) 108 (90 Base) MCG/ACT inhaler Inhale 2 puffs into the lungs every 6 (six) hours as needed for wheezing or shortness of breath.  [name] aspirin EC 81 MG tablet Take 81 mg by mouth daily.  . Carboxymethylcellulose Sod PF 0.25 % SOLN Place 2 drops into both eyes 2 (two) times daily.   . diphenhydramine-acetaminophen (TYLENOL PM) 25-500 MG TABS tablet Take 1 tablet by mouth at bedtime as needed (for sleep).   [name] doxazosin (CARDURA) 8 MG tablet Take 4 mg by mouth at bedtime.   [name] doxycycline (VIBRA-TABS) 100 MG tablet Take 1 tablet (100 mg total) by mouth 2 (two) times daily for 4 days.  . furosemide (LASIX) 20 MG tablet Take 1 tablet (20 mg total) by mouth daily as needed for fluid or edema.  [name] glipiZIDE (GLUCOTROL) 5 MG tablet Take 1 tablet (5 mg total) by mouth 2 (two) times daily before a meal.  . Multiple Vitamins-Minerals (CENTRUM SILVER 50+MEN PO) Take 1 tablet by mouth daily.  . Multiple Vitamins-Minerals (PRESERVISION AREDS 2) CAPS Take 1 capsule by mouth daily with breakfast.   . ONE TOUCH ULTRA TEST test strip USE TO CHECK BLOOD SUGAR 2 TIMES DAILY AS DIRECTED  . ONETOUCH DELICA LANCETS 33G MISC   . predniSONE (DELTASONE) 10 MG tablet Take 40mg  daily for 3days,Take 30mg  daily for 3days,Take 20mg  daily for 3days,Take 10mg  daily for 3days, then stop  .  rosuvastatin (CRESTOR) 40 MG tablet Take 1 tablet (40 mg total) by mouth daily.  . Tiotropium Bromide-Olodaterol (STIOLTO RESPIMAT) 2.5-2.5 MCG/ACT AERS Inhale 2 puffs into the  lungs daily.   . [DISCONTINUED] fluticasone (FLOVENT HFA) 110 MCG/ACT inhaler Inhale 2 puffs into the lungs 2 (two) times daily.  . metoprolol (TOPROL-XL) 200 MG 24 hr tablet Take 0.5 tablets (100 mg total) by mouth daily.   No facility-administered encounter medications on file as of 04/15/2018.

## 2018-04-15 ENCOUNTER — Telehealth: Payer: Self-pay

## 2018-04-15 ENCOUNTER — Ambulatory Visit: Payer: Medicare PPO | Admitting: Family Medicine

## 2018-04-15 ENCOUNTER — Encounter: Payer: Self-pay | Admitting: Family Medicine

## 2018-04-15 VITALS — BP 120/60 | HR 63 | Temp 97.4°F | Ht 66.0 in | Wt 179.2 lb

## 2018-04-15 DIAGNOSIS — I700 Atherosclerosis of aorta: Secondary | ICD-10-CM | POA: Diagnosis not present

## 2018-04-15 DIAGNOSIS — J441 Chronic obstructive pulmonary disease with (acute) exacerbation: Secondary | ICD-10-CM

## 2018-04-15 DIAGNOSIS — N183 Chronic kidney disease, stage 3 unspecified: Secondary | ICD-10-CM

## 2018-04-15 DIAGNOSIS — I25810 Atherosclerosis of coronary artery bypass graft(s) without angina pectoris: Secondary | ICD-10-CM

## 2018-04-15 DIAGNOSIS — J9601 Acute respiratory failure with hypoxia: Secondary | ICD-10-CM | POA: Diagnosis not present

## 2018-04-15 DIAGNOSIS — E1121 Type 2 diabetes mellitus with diabetic nephropathy: Secondary | ICD-10-CM

## 2018-04-15 LAB — CULTURE, BLOOD (ROUTINE X 2)
Culture: NO GROWTH
Culture: NO GROWTH
Special Requests: ADEQUATE

## 2018-04-15 MED ORDER — METOPROLOL SUCCINATE ER 200 MG PO TB24: 100.0000 mg | ORAL_TABLET | Freq: Every day | ORAL | 0 refills | Status: DC

## 2018-04-15 MED ORDER — ASPIRIN EC 81 MG PO TBEC: 81.0000 mg | DELAYED_RELEASE_TABLET | Freq: Every day | ORAL | 0 refills | Status: AC

## 2018-04-15 MED ORDER — APIXABAN 2.5 MG PO TABS: 2.5000 mg | ORAL_TABLET | Freq: Two times a day (BID) | ORAL | 5 refills | Status: DC

## 2018-04-15 NOTE — Telephone Encounter (Signed)
Was able to get patient qualified for the Eliquis $10.00 copay assistance card program good for 2 years. Through program, patient's total out of pocket expense is $10.00 per month x 24 months.   Spoke with patient, he is on board with new script and understands to start Eliquis 2.5mg  bid and to stop aspirin once going on Eliquis.  He will pick up copay assistance card and take to pharmacy for processing.  Rx sent in to pharmacy, per Dr. Cyndie Chime instructions. [name]

## 2018-04-15 NOTE — Discharge Summary (Signed)
[facility] Discharge Summary   Patient: Timothy Cordova JJO:[id number]   PCP: Hannah Beat, MD DOB: 08-Feb-1929   Date of admission: 04/10/2018   Date of discharge: 04/12/2018    Discharge Diagnoses:   Principal Problem:   COPD with acute exacerbation (HCC) Active Problems:   DM (diabetes mellitus), type 2 with renal complications (HCC)   Essential hypertension   CKD (chronic kidney disease) stage 3, GFR 30-59 ml/min (HCC)   Atrial fibrillation and flutter (HCC)   Admitted From: Home Disposition: Home with home health  Recommendations for Outpatient Follow-up:  1. Please follow-up with PCP in 1 to 2 weeks  Follow-up Information    Copland, Spencer, MD. Schedule an appointment as soon as possible for a visit in 1 week(s).   Specialty:  Family Medicine Contact information: [address] [place] [address] [phone]        [facility] - Dme Follow up.   Why:  oxygen Contact information: [address] [place] [address] [phone]        [facility] up.   Specialty:  Home Health Services Why:  HHPT, [facility] Contact information: [address] [place] [address] [phone]          Diet recommendation: Cardiac diet carb modified  Activity: The patient is advised to gradually reintroduce usual activities.  Discharge Condition: good  Code Status: full code  History of present illness: As per the H and P dictated on admission, " RAYDELL HENIGAN is a 85 y.o. male with medical history significant of OSA; HLD; HTN; DM; dementia; CAD s/p CABG; COPD; dementia; CKD; and chronic diastolic CHF presenting with chest pain.   Patient reports that he has had SOB.  Also with chest pain when he breathes hard.  He was concerned about possible PNA.  Symptoms started about 10 days ago - he thought he had indigestion and it would pass.  Certain foods seemed to make it worse but pressure.  No cough - chronic  intermittent dry cough.  No fever.  +wheezing.     ED Course: Atypical interstitial PNA vs. COPD.  Cough, SOB much worse over a few days with pleuritic CP.  92% on RA, wheezing, responding to nebs.  CXR with interstitial thickening.  CTA done, possible infiltrate with diffuse interstitial inflammation - ?bronchitis vs. ILD.  Seems to be improving with nebs, ordered antibiotics and steroids."  Hospital Course:  Summary of his active problems in the hospital is as following. Acute on chronic respiratory failure associated with a COPD exacerbation -Patient's shortness of breath and productive cough are most likely caused by acute COPD exacerbation. -However, his symptoms have been going on for about 10 days and are worsening, so this is worrisome for bacterial infection. -Additionally, CT suggests possible infiltrates. -He does not have fever or leukocytosis. -willobserve for now, still hypoxic and has occasional wheezing. -Nebulizers: treated with Duoneb and prn albuterol continue home regimen  -Switch Solu-Medrol to oral prednisone.  Switch IV antibiotics to oral doxycycline only.  Paroxysmal Afib -Patient admitted for new-onset afib in 8/19 -He was previously on Eliquis but he has stopped taking it due to cost -[facility] NSR on arrivalbut has converted to [employer] telemetry -He is rate controlled at this time on Toprol XL -Will recommend outpatient follow-up with PCP as well as cardiology, consider Coumadin since DOAC has prohibitive cost for the patient.  CKD stage 3 -Appears to be stable at this  time Closely monitoring.  HTN -holding Toprol XL  Type 2 diabetes mellitus, uncontrolled with hyperglycemia, with renal complication -Reasonably good control on recent A1c Hyperglycemic here secondary to steroids. Continue sliding scale insulin.  Patient was ambulatory without any assistance. On the day of the discharge the patient's vitals were stable , and no other acute  medical condition were reported by patient. the patient was felt safe to be discharge at home with home health.  Consultants: none Procedures: noen  DISCHARGE MEDICATION: Allergies as of 04/12/2018      Reactions   Metformin And Related Nausea And Vomiting, Rash   Tramadol Hcl Other (See Comments)   Tremor vs seizure activity   Codeine Nausea And Vomiting   Gabapentin Other (See Comments)   "Messed up my nervous system"   Morphine Sulfate Nausea And Vomiting   Other Nausea And Vomiting   "Narcotics, in general"   Ticlopidine Hcl Other (See Comments)   Reaction unknown by patient or family   Warfarin Sodium Nausea And Vomiting, Other (See Comments)   "Severe fatigue," also   Ace Inhibitors Rash, Cough   Tramadol Rash      Medication List    STOP taking these medications   metoprolol 200 MG 24 hr tablet Commonly known as:  TOPROL-XL     TAKE these medications   albuterol 108 (90 Base) MCG/ACT inhaler Commonly known as:  PROVENTIL HFA;VENTOLIN HFA Inhale 2 puffs into the lungs every 6 (six) hours as needed for wheezing or shortness of breath.   aspirin EC 81 MG tablet Take 81 mg by mouth daily.   Carboxymethylcellulose Sod PF 0.25 % Soln Place 2 drops into both eyes 2 (two) times daily. Notes to patient:  Twice daily   diphenhydramine-acetaminophen 25-500 MG Tabs tablet Commonly known as:  TYLENOL PM Take 1 tablet by mouth at bedtime as needed (for sleep).   doxazosin 8 MG tablet Commonly known as:  CARDURA Take 4 mg by mouth at bedtime.   doxycycline 100 MG tablet Commonly known as:  VIBRA-TABS Take 1 tablet (100 mg total) by mouth 2 (two) times daily for 4 days. Notes to patient:  Take 2 times daily for 4 days   furosemide 20 MG tablet Commonly known as:  LASIX Take 1 tablet (20 mg total) by mouth daily as needed for fluid or edema. What changed:    when to take this  reasons to take this   glipiZIDE 5 MG tablet Commonly known as:  GLUCOTROL Take 1  tablet (5 mg total) by mouth 2 (two) times daily before a meal.   ONE TOUCH ULTRA TEST test strip Generic drug:  glucose blood USE TO CHECK BLOOD SUGAR 2 TIMES DAILY AS DIRECTED   ONETOUCH DELICA LANCETS 33G Misc   predniSONE 10 MG tablet Commonly known as:  DELTASONE Take 40mg  daily for 3days,Take 30mg  daily for 3days,Take 20mg  daily for 3days,Take 10mg  daily for 3days, then stop   PRESERVISION AREDS 2 Caps Take 1 capsule by mouth daily with breakfast.   CENTRUM SILVER 50+MEN PO Take 1 tablet by mouth daily.   rosuvastatin 40 MG tablet Commonly known as:  CRESTOR Take 1 tablet (40 mg total) by mouth daily.   STIOLTO RESPIMAT 2.5-2.5 MCG/ACT Aers Generic drug:  Tiotropium Bromide-Olodaterol Inhale 2 puffs into the lungs daily.      Allergies  Allergen Reactions  . Metformin And Related Nausea And Vomiting and Rash  . Tramadol Hcl Other (See Comments)    Tremor vs  seizure activity  . Codeine Nausea And Vomiting  . Gabapentin Other (See Comments)    "Messed up my nervous system"  . Morphine Sulfate Nausea And Vomiting  . Other Nausea And Vomiting    "Narcotics, in general"  . Ticlopidine Hcl Other (See Comments)    Reaction unknown by patient or family  . Warfarin Sodium Nausea And Vomiting and Other (See Comments)    "Severe fatigue," also  . Ace Inhibitors Rash and Cough  . Tramadol Rash   Discharge Instructions    Diet - low sodium heart healthy   Complete by:  As directed    Discharge instructions   Complete by:  As directed    It is important that you read following instructions as well as go over your medication list with RN to help you understand your care after this hospitalization.  Discharge Instructions: Please follow-up with PCP in one week  Please request your primary care physician to go over all Hospital Tests and Procedure/Radiological results at the follow up,  Please get all Hospital records sent to your PCP by signing hospital release before  you go home.   Do not take more than prescribed Pain, Sleep and Anxiety Medications. You were cared for by a hospitalist during your hospital stay. If you have any questions about your discharge medications or the care you received while you were in the hospital after you are discharged, you can call the unit you were admitted to and ask to speak with the hospitalist on call if the hospitalist that took care of you is not available.  Once you are discharged, your primary care physician will handle any further medical issues. Please note that NO REFILLS for any discharge medications will be authorized once you are discharged, as it is imperative that you return to your primary care physician (or establish a relationship with a primary care physician if you do not have one) for your aftercare needs so that they can reassess your need for medications and monitor your lab values. You Must read complete instructions/literature along with all the possible adverse reactions/side effects for all the Medicines you take and that have been prescribed to you. Take any new Medicines after you have completely understood and accept all the possible adverse reactions/side effects. Wear Seat belts while driving. If you have smoked or chewed Tobacco in the last 2 yrs please stop smoking and/or stop any Recreational drug use.   Increase activity slowly   Complete by:  As directed      Discharge Exam: Filed Weights   04/11/18 1009  Weight: 83.2 kg   Vitals:   04/11/18 2332 04/12/18 0818  BP: (!) 103/30 (!) 153/58  Pulse: 68 65  Resp:    Temp: 97.6 F (36.4 C) (!) 97.2 F (36.2 C)  SpO2: 96% 95%   General: Appear in no distress, no Rash; Oral Mucosa moist. Cardiovascular: S1 and S2 Present, no Murmur, no JVD Respiratory: Bilateral Air entry present and Clear to Auscultation, no Crackles, no wheezes Abdomen: Bowel Sound present, Soft and no tenderness Extremities: no Pedal edema, no calf  tenderness Neurology: Grossly no focal neuro deficit.  The results of significant diagnostics from this hospitalization (including imaging, microbiology, ancillary and laboratory) are listed below for reference.    Significant Diagnostic Studies: Dg Chest 2 View  Result Date: 04/10/2018 CLINICAL DATA:  85 year old male with history of tightness in the chest for the past 10 days. EXAM: CHEST - 2 VIEW COMPARISON:  Chest  x-ray 11/30/2017. FINDINGS: Diffuse peribronchial cuffing (most severe in the right lung). No acute consolidative airspace disease. No pleural effusions. No evidence of pulmonary edema. Heart size is normal. Upper mediastinal contours are within normal limits. Aortic atherosclerosis. Status post median sternotomy for CABG. Electronic device projecting over the lower left hemithorax. IMPRESSION: 1. Severe diffuse peribronchial cuffing asymmetrically distributed involving the right lung to a greater extent than the left, concerning for an acute bronchitis. 2. Aortic atherosclerosis. Electronically Signed   By: Trudie Reedaniel  Entrikin M.D.   On: 04/10/2018 11:32   Ct Angio Chest Pe W Or Wo Contrast  Result Date: 04/10/2018 CLINICAL DATA:  Chest tightness. EXAM: CT ANGIOGRAPHY CHEST WITH CONTRAST TECHNIQUE: Multidetector CT imaging of the chest was performed using the standard protocol during bolus administration of intravenous contrast. Multiplanar CT image reconstructions and MIPs were obtained to evaluate the vascular anatomy. CONTRAST:  60mL ISOVUE-370 IOPAMIDOL (ISOVUE-370) INJECTION 76% COMPARISON:  Chest x-ray dated 04/10/2018 and chest CTs dated 07/31/2017 and 12/10/2015 FINDINGS: Cardiovascular: No pulmonary emboli. Extensive aortic atherosclerosis and coronary artery calcifications. CABG. Heart size is normal. No pericardial effusion. Stent in the left subclavian artery. Mediastinum/Nodes: No enlarged mediastinal, hilar, or axillary lymph nodes. Thyroid gland, trachea, and esophagus  demonstrate no significant findings. Lungs/Pleura: Patient has an extensive faint interstitial infiltrate involving the right upper lobe and to a lesser degree the right lower lobe. There small patchy areas of new infiltrate in the left upper lobe and in the superior segment of the left lower lobe. There is a chronic bleb posteriorly in right lower lobe. New tiny right effusion. Upper Abdomen: No acute abnormalities. Stable 15 mm exophytic dense lesion on the posterior aspect of the right kidney consistent with a hyperdense cyst. Musculoskeletal: No acute abnormality. Flowing osteophytes fuse almost the entire thoracic spine. Review of the MIP images confirms the above findings. IMPRESSION: 1. No pulmonary emboli. 2. Extensive new faint interstitial infiltrate involving the right upper lobe and to a lesser degree the right lower lobe. 3. Small new patchy areas of infiltrate in the left upper lobe and superior segment of the left lower lobe. 4.  Aortic Atherosclerosis (ICD10-I70.0). Electronically Signed   By: Francene BoyersJames  Maxwell M.D.   On: 04/10/2018 15:25    Microbiology: Recent Results (from the past 240 hour(s))  Blood culture (routine x 2)     Status: None   Collection Time: 04/10/18  6:30 PM  Result Value Ref Range Status   Specimen Description BLOOD RIGHT ANTECUBITAL  Final   Special Requests   Final    BOTTLES DRAWN AEROBIC AND ANAEROBIC Blood Culture results may not be optimal due to an excessive volume of blood received in culture bottles Performed at [facility], [address] [place], [place] [address]    Culture NO GROWTH 5 DAYS  Final   Report Status 04/15/2018 FINAL  Final  Blood culture (routine x 2)     Status: None   Collection Time: 04/10/18  6:40 PM  Result Value Ref Range Status   Specimen Description BLOOD RIGHT HAND  Final   Special Requests   Final    BOTTLES DRAWN AEROBIC ONLY Blood Culture adequate volume Performed at [facility], [address]  [place], [place] [address]    Culture NO GROWTH 5 DAYS  Final   Report Status 04/15/2018 FINAL  Final     Labs: CBC: Recent Labs  Lab 04/10/18 1047 04/11/18 0712  WBC 9.9 5.6  NEUTROABS  --  4.8  HGB 11.6* 10.4*  HCT 38.4* 32.8*  MCV 99.0 95.6  PLT 354 284   Basic Metabolic Panel: Recent Labs  Lab 04/10/18 1047 04/11/18 0712  NA 137 136  K 5.8* 5.1  CL 102 102  CO2 24 21*  GLUCOSE 201* 326*  BUN 37* 46*  CREATININE 1.80* 1.96*  CALCIUM 9.8 9.1  MG  --  2.1   Liver Function Tests: Recent Labs  Lab 04/11/18 0712  AST 15  ALT 18  ALKPHOS 48  BILITOT 0.1*  PROT 6.4*  ALBUMIN 2.6*   No results for input(s): LIPASE, AMYLASE in the last 168 hours. No results for input(s): AMMONIA in the last 168 hours. Cardiac Enzymes: No results for input(s): CKTOTAL, CKMB, CKMBINDEX, TROPONINI in the last 168 hours. BNP (last 3 results) Recent Labs    07/30/17 1644 11/28/17 0748 04/10/18 1047  BNP 127.4* 315.0* 129.0*   CBG: Recent Labs  Lab 04/10/18 2203 04/11/18 0752 04/11/18 1158 04/11/18 1656 04/11/18 2131  GLUCAP 393* 313* 329* 227* 179*   Time spent: 35 minutes  Signed:  Lynden OxfordPranav Aspyn Warnke  [facility] 04/12/2018 , 3:04 PM

## 2018-04-15 NOTE — Telephone Encounter (Signed)
Sadly, pharmacist calls to report that due to patient having a Medicare Part D plan this will make the discount copay card invalid even though online he qualified and card was activated.  Patient also used his 30 day free trial back in August when Eliquis was first prescribed.   Patient doesn't qualify for assistance program due to not meeting income requirements and due to his Medicare Part D plan he cannot use the copay card.  As a result, the only options we have are for patient to use ASA or warfarin for management.  FYI to Dr. Patsy Lager, I am sorry and very disappointed, I thought I had a fix for him.  Patient made aware.

## 2018-04-15 NOTE — Telephone Encounter (Signed)
This is understood - same problems with warfarin as noted previously. Will continue with asa.

## 2018-04-16 ENCOUNTER — Telehealth: Payer: Self-pay | Admitting: *Deleted

## 2018-04-16 NOTE — Telephone Encounter (Signed)
Left message for [name] that it is okay to start [name]'s plan on care on 04/17/2018 per Dr. Patsy Lageropland.

## 2018-04-16 NOTE — Telephone Encounter (Signed)
Spoke to ReunionKecia with [facility] who states they received a referral for pt. Thwy wont be able to start his plan of care until 01/08 and wanted to ensure that would be ok, since it is greater than 48hrs of the request being made. pls advise

## 2018-04-16 NOTE — Telephone Encounter (Signed)
This is ok

## 2018-04-18 NOTE — Telephone Encounter (Signed)
Will be going to pt's home today, FYI

## 2018-04-18 NOTE — Telephone Encounter (Signed)
noted 

## 2018-05-03 ENCOUNTER — Emergency Department (HOSPITAL_COMMUNITY): Payer: Medicare PPO

## 2018-05-03 ENCOUNTER — Observation Stay (HOSPITAL_COMMUNITY)
Admission: EM | Admit: 2018-05-03 | Discharge: 2018-05-05 | Disposition: A | Discharge status: Home / Self Care | Payer: Medicare PPO | Attending: Internal Medicine | Admitting: Internal Medicine

## 2018-05-03 ENCOUNTER — Other Ambulatory Visit: Payer: Self-pay

## 2018-05-03 ENCOUNTER — Encounter (HOSPITAL_COMMUNITY): Payer: Self-pay

## 2018-05-03 DIAGNOSIS — E1121 Type 2 diabetes mellitus with diabetic nephropathy: Secondary | ICD-10-CM | POA: Diagnosis present

## 2018-05-03 DIAGNOSIS — Z951 Presence of aortocoronary bypass graft: Secondary | ICD-10-CM | POA: Insufficient documentation

## 2018-05-03 DIAGNOSIS — N183 Chronic kidney disease, stage 3 unspecified: Secondary | ICD-10-CM | POA: Diagnosis present

## 2018-05-03 DIAGNOSIS — G4730 Sleep apnea, unspecified: Secondary | ICD-10-CM | POA: Diagnosis not present

## 2018-05-03 DIAGNOSIS — Z7982 Long term (current) use of aspirin: Secondary | ICD-10-CM | POA: Insufficient documentation

## 2018-05-03 DIAGNOSIS — J441 Chronic obstructive pulmonary disease with (acute) exacerbation: Secondary | ICD-10-CM | POA: Diagnosis not present

## 2018-05-03 DIAGNOSIS — I2510 Atherosclerotic heart disease of native coronary artery without angina pectoris: Secondary | ICD-10-CM | POA: Diagnosis not present

## 2018-05-03 DIAGNOSIS — E782 Mixed hyperlipidemia: Secondary | ICD-10-CM | POA: Insufficient documentation

## 2018-05-03 DIAGNOSIS — Z87891 Personal history of nicotine dependence: Secondary | ICD-10-CM | POA: Diagnosis not present

## 2018-05-03 DIAGNOSIS — I4510 Unspecified right bundle-branch block: Secondary | ICD-10-CM | POA: Diagnosis not present

## 2018-05-03 DIAGNOSIS — J449 Chronic obstructive pulmonary disease, unspecified: Secondary | ICD-10-CM | POA: Diagnosis present

## 2018-05-03 DIAGNOSIS — R0602 Shortness of breath: Secondary | ICD-10-CM | POA: Diagnosis not present

## 2018-05-03 DIAGNOSIS — Z79899 Other long term (current) drug therapy: Secondary | ICD-10-CM | POA: Diagnosis not present

## 2018-05-03 DIAGNOSIS — J90 Pleural effusion, not elsewhere classified: Secondary | ICD-10-CM | POA: Diagnosis not present

## 2018-05-03 DIAGNOSIS — J432 Centrilobular emphysema: Secondary | ICD-10-CM | POA: Diagnosis present

## 2018-05-03 DIAGNOSIS — R079 Chest pain, unspecified: Secondary | ICD-10-CM | POA: Diagnosis not present

## 2018-05-03 DIAGNOSIS — J189 Pneumonia, unspecified organism: Secondary | ICD-10-CM | POA: Diagnosis not present

## 2018-05-03 DIAGNOSIS — E1122 Type 2 diabetes mellitus with diabetic chronic kidney disease: Secondary | ICD-10-CM | POA: Diagnosis not present

## 2018-05-03 DIAGNOSIS — I130 Hypertensive heart and chronic kidney disease with heart failure and stage 1 through stage 4 chronic kidney disease, or unspecified chronic kidney disease: Secondary | ICD-10-CM | POA: Diagnosis not present

## 2018-05-03 DIAGNOSIS — Z66 Do not resuscitate: Secondary | ICD-10-CM | POA: Diagnosis not present

## 2018-05-03 DIAGNOSIS — I4891 Unspecified atrial fibrillation: Secondary | ICD-10-CM | POA: Insufficient documentation

## 2018-05-03 DIAGNOSIS — R0600 Dyspnea, unspecified: Secondary | ICD-10-CM | POA: Diagnosis not present

## 2018-05-03 DIAGNOSIS — Z515 Encounter for palliative care: Secondary | ICD-10-CM

## 2018-05-03 DIAGNOSIS — Z7189 Other specified counseling: Secondary | ICD-10-CM

## 2018-05-03 DIAGNOSIS — J984 Other disorders of lung: Secondary | ICD-10-CM | POA: Diagnosis present

## 2018-05-03 DIAGNOSIS — R918 Other nonspecific abnormal finding of lung field: Secondary | ICD-10-CM | POA: Diagnosis not present

## 2018-05-03 LAB — I-STAT TROPONIN, ED: Troponin i, poc: 0.01 ng/mL (ref 0.00–0.08)

## 2018-05-03 LAB — CBC
HCT: 33.9 % — ABNORMAL LOW (ref 39.0–52.0)
HCT: 36.2 % — ABNORMAL LOW (ref 39.0–52.0)
Hemoglobin: 10 g/dL — ABNORMAL LOW (ref 13.0–17.0)
Hemoglobin: 10.8 g/dL — ABNORMAL LOW (ref 13.0–17.0)
MCH: 29.1 pg (ref 26.0–34.0)
MCH: 29.5 pg (ref 26.0–34.0)
MCHC: 29.5 g/dL — ABNORMAL LOW (ref 30.0–36.0)
MCHC: 29.8 g/dL — ABNORMAL LOW (ref 30.0–36.0)
MCV: 98.5 fL (ref 80.0–100.0)
MCV: 98.9 fL (ref 80.0–100.0)
PLATELETS: 235 10*3/uL (ref 150–400)
Platelets: 210 10*3/uL (ref 150–400)
RBC: 3.44 MIL/uL — ABNORMAL LOW (ref 4.22–5.81)
RBC: 3.66 MIL/uL — AB (ref 4.22–5.81)
RDW: 12.4 % (ref 11.5–15.5)
RDW: 12.3 % (ref 11.5–15.5)
WBC: 6.2 10*3/uL (ref 4.0–10.5)
WBC: 6.2 10*3/uL (ref 4.0–10.5)
nRBC: 0 % (ref 0.0–0.2)
nRBC: 0 % (ref 0.0–0.2)

## 2018-05-03 LAB — BASIC METABOLIC PANEL
Anion gap: 11 (ref 5–15)
BUN: 25 mg/dL — ABNORMAL HIGH (ref 8–23)
CALCIUM: 9.2 mg/dL (ref 8.9–10.3)
CO2: 28 mmol/L (ref 22–32)
Chloride: 98 mmol/L (ref 98–111)
Creatinine, Ser: 1.63 mg/dL — ABNORMAL HIGH (ref 0.61–1.24)
GFR calc non Af Amer: 37 mL/min — ABNORMAL LOW (ref >60)
GFR, EST AFRICAN AMERICAN: 43 mL/min — AB (ref >60)
Glucose, Bld: 267 mg/dL — ABNORMAL HIGH (ref 70–99)
Potassium: 5.2 mmol/L — ABNORMAL HIGH (ref 3.5–5.1)
Sodium: 137 mmol/L (ref 135–145)

## 2018-05-03 LAB — CREATININE, SERUM
Creatinine, Ser: 1.58 mg/dL — ABNORMAL HIGH (ref 0.61–1.24)
GFR calc Af Amer: 45 mL/min — ABNORMAL LOW (ref >60)
GFR calc non Af Amer: 38 mL/min — ABNORMAL LOW (ref >60)

## 2018-05-03 LAB — BRAIN NATRIURETIC PEPTIDE: B Natriuretic Peptide: 207.8 pg/mL — ABNORMAL HIGH (ref 0.0–100.0)

## 2018-05-03 LAB — LACTIC ACID, PLASMA
Lactic Acid, Venous: 0.9 mmol/L (ref 0.5–1.9)
Lactic Acid, Venous: 1.3 mmol/L (ref 0.5–1.9)

## 2018-05-03 LAB — GLUCOSE, CAPILLARY: GLUCOSE-CAPILLARY: 160 mg/dL — AB (ref 70–99)

## 2018-05-03 LAB — HEMOGLOBIN A1C
Hgb A1c MFr Bld: 8.5 % — ABNORMAL HIGH (ref 4.8–5.6)
Mean Plasma Glucose: 197.25 mg/dL

## 2018-05-03 MED ORDER — METHYLPREDNISOLONE SODIUM SUCC 125 MG IJ SOLR
60.0000 mg | Freq: Four times a day (QID) | INTRAMUSCULAR | Status: AC
  Administered 2018-05-03 – 2018-05-04 (×4): 60 mg via INTRAVENOUS
  Filled 2018-05-03 (×4): qty 2

## 2018-05-03 MED ORDER — SODIUM CHLORIDE 0.9 % IV SOLN
2.0000 g | INTRAVENOUS | Status: DC
  Administered 2018-05-03 – 2018-05-04 (×2): 2 g via INTRAVENOUS
  Filled 2018-05-03 (×3): qty 20

## 2018-05-03 MED ORDER — SODIUM CHLORIDE 0.9% FLUSH
3.0000 mL | Freq: Once | INTRAVENOUS | Status: AC
  Administered 2018-05-03: 3 mL via INTRAVENOUS

## 2018-05-03 MED ORDER — DIPHENHYDRAMINE HCL 25 MG PO CAPS
25.0000 mg | ORAL_CAPSULE | Freq: Every evening | ORAL | Status: DC | PRN
  Administered 2018-05-03: 25 mg via ORAL
  Filled 2018-05-03: qty 1

## 2018-05-03 MED ORDER — ACETAMINOPHEN 325 MG PO TABS
650.0000 mg | ORAL_TABLET | Freq: Four times a day (QID) | ORAL | Status: DC | PRN
  Administered 2018-05-03: 650 mg via ORAL
  Filled 2018-05-03: qty 2

## 2018-05-03 MED ORDER — INSULIN ASPART 100 UNIT/ML ~~LOC~~ SOLN
0.0000 [IU] | Freq: Three times a day (TID) | SUBCUTANEOUS | Status: DC
  Administered 2018-05-04: 20 [IU] via SUBCUTANEOUS
  Administered 2018-05-04: 11 [IU] via SUBCUTANEOUS
  Administered 2018-05-04 – 2018-05-05 (×2): 20 [IU] via SUBCUTANEOUS
  Administered 2018-05-05: 15 [IU] via SUBCUTANEOUS

## 2018-05-03 MED ORDER — SODIUM CHLORIDE 0.9 % IV SOLN
500.0000 mg | INTRAVENOUS | Status: DC
  Administered 2018-05-03 – 2018-05-04 (×2): 500 mg via INTRAVENOUS
  Filled 2018-05-03 (×3): qty 500

## 2018-05-03 MED ORDER — PREDNISONE 20 MG PO TABS
40.0000 mg | ORAL_TABLET | Freq: Every day | ORAL | Status: DC
  Administered 2018-05-05: 40 mg via ORAL
  Filled 2018-05-03: qty 2

## 2018-05-03 MED ORDER — MOMETASONE FURO-FORMOTEROL FUM 200-5 MCG/ACT IN AERO
1.0000 | INHALATION_SPRAY | Freq: Two times a day (BID) | RESPIRATORY_TRACT | Status: DC
  Administered 2018-05-04 – 2018-05-05 (×3): 1 via RESPIRATORY_TRACT
  Filled 2018-05-03: qty 8.8

## 2018-05-03 MED ORDER — ENOXAPARIN SODIUM 30 MG/0.3ML ~~LOC~~ SOLN
30.0000 mg | SUBCUTANEOUS | Status: DC
  Administered 2018-05-03 – 2018-05-04 (×2): 30 mg via SUBCUTANEOUS
  Filled 2018-05-03 (×2): qty 0.3

## 2018-05-03 MED ORDER — IPRATROPIUM-ALBUTEROL 0.5-2.5 (3) MG/3ML IN SOLN
3.0000 mL | Freq: Four times a day (QID) | RESPIRATORY_TRACT | Status: DC
  Administered 2018-05-03 – 2018-05-05 (×6): 3 mL via RESPIRATORY_TRACT
  Filled 2018-05-03 (×8): qty 3

## 2018-05-03 MED ORDER — ALBUTEROL SULFATE (2.5 MG/3ML) 0.083% IN NEBU: 2.5000 mg | INHALATION_SOLUTION | RESPIRATORY_TRACT | Status: DC | PRN

## 2018-05-03 NOTE — ED Provider Notes (Signed)
[facility] EMERGENCY DEPARTMENT Provider Note   CSN: [id number] Arrival date & time: 05/03/18  1603     History   Chief Complaint Chief Complaint  Patient presents with  . Shortness of Breath  . Chest Pain    HPI Timothy Cordova is a 85 y.o. male.  85 year old male presents with cough congestion x24 hours.  Patient recently diagnosed with pneumonia and discharged about 3 weeks ago.  States that this feels very similar and describes sharp pain in left side of his chest.  Has been more dyspneic on exertion.  Denies any anginal qualities.  No reported fever but notes subjective chills and weakness.  No vomiting or diarrhea.  Denies any urinary symptoms.     Past Medical History:  Diagnosis Date  . Allergic rhinitis   . Benign prostatic hypertrophy   . Chronic diastolic heart failure (HCC)    8/08 ECHO with EF 55%  . CKD (chronic kidney disease)   . COPD (chronic obstructive pulmonary disease) (HCC)    moderate. followed by Dr.Byrum  . Coronary artery disease    s/p CABG 2006. LHC (1/10): SVG-OM and PLOM, SVG-D, and LIMA-LAD were all patent  . Dementia (HCC)    (mild)--06/2006  . Diabetes mellitus   . Edema    localized. suspect diastolic CHF + venous insufficiency  . Hearing loss    left >>right  . History of tobacco abuse   . Hypertension   . Mixed hyperlipidemia   . Osteoarthritis   . Pneumonia 07/31/2017  . Sleep apnea    no CPAP    Patient Active Problem List   Diagnosis Date Noted  . Atrial fibrillation and flutter (HCC)   . COPD (chronic obstructive pulmonary disease) (HCC) 07/30/2017  . Pulmonary HTN (HCC) 07/30/2017  . Solitary pulmonary nodule 12/10/2015  . Anemia 11/26/2014  . COPD with acute exacerbation (HCC) 05/21/2013  . Aortic calcification, 04/20/2005 CT Chest 04/03/2013  . Sleep apnea   . Tobacco abuse (in remission)   . Chronic low back pain 04/18/2011  . Renal lesion 03/27/2011  . CKD (chronic kidney disease) stage  3, GFR 30-59 ml/min (HCC) 08/18/2010  . OSTEOARTHRITIS 12/06/2009  . DIASTOLIC HEART FAILURE, CHRONIC 08/18/2009  . Mixed hyperlipidemia 06/11/2008  . CAD, AUTOLOGOUS BYPASS GRAFT 06/07/2008  . DM (diabetes mellitus), type 2 with renal complications (HCC) 09/04/2007  . ALLERGIC RHINITIS 01/30/2007  . HEARING LOSS 07/06/2006  . Essential hypertension 07/06/2006  . BPH (benign prostatic hyperplasia) 07/06/2006  . Centrilobular emphysema (HCC) 08/08/2005    Past Surgical History:  Procedure Laterality Date  . ANGIOPLASTY  [id number]  . CARDIAC CATHETERIZATION  90s   [place], [place] x1stent  . CARDIAC CATHETERIZATION     [facility];x2 stents  . CARDIAC CATHETERIZATION  04/29/2013  . CARDIOVERSION N/A 12/05/2017   Procedure: CARDIOVERSION;  Surgeon: Elease Hashimoto Deloris Ping, MD;  Location: [facility] ENDOSCOPY;  Service: Cardiovascular;  Laterality: N/A;  . CORONARY ARTERY BYPASS GRAFT  02/2005  . ROTATOR CUFF REPAIR  2005   left, Gioffre  . TEE WITHOUT CARDIOVERSION N/A 12/05/2017   Procedure: TRANSESOPHAGEAL ECHOCARDIOGRAM (TEE);  Surgeon: Elease Hashimoto Deloris Ping, MD;  Location: [facility] ENDOSCOPY;  Service: Cardiovascular;  Laterality: N/A;        Home Medications    Prior to Admission medications   Medication Sig Start Date End Date Taking? Authorizing Provider  albuterol (PROVENTIL HFA;VENTOLIN HFA) 108 (90 Base) MCG/ACT inhaler Inhale 2 puffs into the lungs every 6 (six) hours as needed for  wheezing or shortness of breath. 04/12/18   Rolly SalterPatel, Pranav M, MD  aspirin EC 81 MG tablet Take 1 tablet (81 mg total) by mouth daily. 04/15/18   Copland, Karleen HampshireSpencer, MD  Carboxymethylcellulose Sod PF 0.25 % SOLN Place 2 drops into both eyes 2 (two) times daily.     [provider]  diphenhydramine-acetaminophen (TYLENOL PM) 25-500 MG TABS tablet Take 1 tablet by mouth at bedtime as needed (for sleep).     [provider]  doxazosin (CARDURA) 8 MG tablet Take 4 mg by mouth at bedtime.     [provider]    furosemide (LASIX) 20 MG tablet Take 1 tablet (20 mg total) by mouth daily as needed for fluid or edema. 04/12/18   Rolly SalterPatel, Pranav M, MD  glipiZIDE (GLUCOTROL) 5 MG tablet Take 1 tablet (5 mg total) by mouth 2 (two) times daily before a meal. 08/08/17   Copland, Karleen HampshireSpencer, MD  metoprolol (TOPROL-XL) 200 MG 24 hr tablet Take 0.5 tablets (100 mg total) by mouth daily. 04/15/18   Copland, Karleen HampshireSpencer, MD  Multiple Vitamins-Minerals (CENTRUM SILVER 50+MEN PO) Take 1 tablet by mouth daily.    [provider]  Multiple Vitamins-Minerals (PRESERVISION AREDS 2) CAPS Take 1 capsule by mouth daily with breakfast.     [provider]  ONE TOUCH ULTRA TEST test strip USE TO CHECK BLOOD SUGAR 2 TIMES DAILY AS DIRECTED 09/08/15   Copland, Karleen HampshireSpencer, MD  [facility] DEL[facility] LANCETS 33G MISC  02/13/13   [provider]  predniSONE (DELTASONE) 10 MG tablet Take 40mg  daily for 3days,Take 30mg  daily for 3days,Take 20mg  daily for 3days,Take 10mg  daily for 3days, then stop 04/12/18   Rolly SalterPatel, Pranav M, MD  rosuvastatin (CRESTOR) 40 MG tablet Take 1 tablet (40 mg total) by mouth daily. 09/17/17   Copland, Karleen HampshireSpencer, MD  Tiotropium Bromide-Olodaterol (STIOLTO RESPIMAT) 2.5-2.5 MCG/ACT AERS Inhale 2 puffs into the lungs daily.     [provider]    Family History Family History  Problem Relation Age of Onset  . Heart attack Mother   . Lung cancer Sister   . Prostate cancer Neg Hx   . Kidney cancer Neg Hx     Social History Social History   Tobacco Use  . Smoking status: Former Smoker    Packs/day: 3.00    Years: 60.00    Pack years: 180.00    Types: Cigarettes    Last attempt to quit: 07/12/2005    Years since quitting: 12.8  . Smokeless tobacco: Never Used  Substance Use Topics  . Alcohol use: No    Alcohol/week: 0.0 standard drinks  . Drug use: No     Allergies   Metformin and related; Tramadol hcl; Codeine; Gabapentin; Morphine sulfate; Other; Ticlopidine hcl; Warfarin sodium; Ace  inhibitors; and Tramadol   Review of Systems Review of Systems  All other systems reviewed and are negative.    Physical Exam Updated Vital Signs BP (!) 127/107   Pulse 84   Temp 98.1 F (36.7 C) (Oral)   Resp (!) 21   SpO2 92%   Physical Exam Vitals signs and nursing note reviewed.  Constitutional:      General: He is not in acute distress.    Appearance: Normal appearance. He is well-developed. He is not toxic-appearing.  HENT:     Head: Normocephalic and atraumatic.  Eyes:     General: Lids are normal.     Conjunctiva/sclera: Conjunctivae normal.     Pupils: Pupils are equal,  round, and reactive to light.  Neck:     Musculoskeletal: Normal range of motion and neck supple.     Thyroid: No thyroid mass.     Trachea: No tracheal deviation.  Cardiovascular:     Rate and Rhythm: Normal rate and regular rhythm.     Heart sounds: Normal heart sounds. No murmur. No gallop.   Pulmonary:     Effort: Pulmonary effort is normal. No respiratory distress.     Breath sounds: Normal breath sounds. No stridor. No decreased breath sounds, wheezing, rhonchi or rales.  Abdominal:     General: Bowel sounds are normal. There is no distension.     Palpations: Abdomen is soft.     Tenderness: There is no abdominal tenderness. There is no rebound.  Musculoskeletal: Normal range of motion.        General: No tenderness.  Skin:    General: Skin is warm and dry.     Findings: No abrasion or rash.  Neurological:     Mental Status: He is alert and oriented to person, place, and time.     GCS: GCS eye subscore is 4. GCS verbal subscore is 5. GCS motor subscore is 6.     Cranial Nerves: No cranial nerve deficit.     Sensory: No sensory deficit.  Psychiatric:        Attention and Perception: Attention normal.        Speech: Speech normal.        Behavior: Behavior normal.      ED Treatments / Results  Labs (all labs ordered are listed, but only abnormal results are displayed) Labs  Reviewed  BASIC METABOLIC PANEL - Abnormal; Notable for the following components:      Result Value   Potassium 5.2 (*)    Glucose, Bld 267 (*)    BUN 25 (*)    Creatinine, Ser 1.63 (*)    GFR calc non Af Amer 37 (*)    GFR calc Af Amer 43 (*)    All other components within normal limits  CBC - Abnormal; Notable for the following components:   RBC 3.44 (*)    Hemoglobin 10.0 (*)    HCT 33.9 (*)    MCHC 29.5 (*)    All other components within normal limits  CULTURE, BLOOD (ROUTINE X 2)  CULTURE, BLOOD (ROUTINE X 2)  BRAIN NATRIURETIC PEPTIDE  LACTIC ACID, PLASMA  LACTIC ACID, PLASMA  I-STAT TROPONIN, ED    EKG EKG Interpretation  Date/Time:  Friday May 03 2018 16:08:22 EST Ventricular Rate:  85 PR Interval:  240 QRS Duration: 138 QT Interval:  354 QTC Calculation: 421 R Axis:   -66 Text Interpretation:  Sinus rhythm with 1st degree A-V block with Premature supraventricular complexes Left axis deviation Right bundle branch block Minimal voltage criteria for LVH, may be normal variant Anterior infarct , age undetermined Abnormal ECG No significant change since last tracing Confirmed by Lorre NickAllen, Priscila Bean ([id number]) on 05/03/2018 4:50:18 PM   Radiology Dg Chest 2 View  Result Date: 05/03/2018 CLINICAL DATA:  Chest pain EXAM: CHEST - 2 VIEW COMPARISON:  04/10/2017, 11/30/2017 FINDINGS: Post sternotomy changes. Asymmetric ground-glass opacity in the left thorax. Mild cardiomegaly. No pleural effusion. No pneumothorax. IMPRESSION: 1. Asymmetric ground-glass opacity in the left thorax which may be secondary to asymmetric edema or interstitial inflammatory process 2. Mild cardiomegaly. Electronically Signed   By: Jasmine PangKim  Fujinaga M.D.   On: 05/03/2018 16:58    Procedures Procedures (including  critical care time)  Medications Ordered in ED Medications  sodium chloride flush (NS) 0.9 % injection 3 mL (has no administration in time range)  cefTRIAXone (ROCEPHIN) 2 g in sodium chloride  0.9 % 100 mL IVPB (has no administration in time range)  azithromycin (ZITHROMAX) 500 mg in sodium chloride 0.9 % 250 mL IVPB (has no administration in time range)     Initial Impression / Assessment and Plan / ED Course  I have reviewed the triage vital signs and the nursing notes.  Pertinent labs & imaging results that were available during my care of the patient were reviewed by me and considered in my medical decision making (see chart for details).     Patient started on IV antibiotics for suspected pneumonia.  BNP is pending at this time to rule out CHF.  Patient will be admitted to the hospital Final Clinical Impressions(s) / ED Diagnoses   Final diagnoses:  None    ED Discharge Orders    None       Lorre Nick, MD 05/03/18 1722

## 2018-05-03 NOTE — Progress Notes (Signed)
2W 32 asking for Tylenol pm (Tylen and Benadryl for pain 10/10 L inhalation pain and to help sleep Dx COPD

## 2018-05-03 NOTE — H&P (Signed)
History and Physical    Timothy Postinhomas O Speedy ZOX:[id number] DOB: [birth year] DOA: 05/03/2018  PCP: Hannah Beatopland, Spencer, MD  Patient coming from: Home  I have personally briefly reviewed [facility]  Chief Complaint: Shortness of breath and pleuritic chest pain  HPI: Timothy Cordova is a 85 y.o. male with medical history significant of severe COPD with 160-pack-year history [facility]  Was in the hospital approximately 3 weeks ago with similar symptoms.  Patient thought he had pneumonia but actually he was diagnosed with a COPD exacerbation.  Patient is now complaining of sharp pain in the left side of his chest when he takes a deep breath.  He has been more dyspneic with exertion.  Family reports that when he gets up and walks around he does not use his oxygen.  He only uses his oxygen at night.  Family also reports that when he does walk around his sats dropped into the 70s.  Patient also complains of a chronic dry cough without sputum production.  The patient denies any anginal history.  Patient's wife and granddaughter feel that the patient would do better with 24/7 oxygen.  The patient is having a hard time realizing he needs to use it that way.  They also seem to need a great deal of more help at home.  We had an extended conversation about end-stage lung disease and hospice care.  They are all in favor of that.  At this point we will bring the patient in to ensure that he does not have a pneumonia.  A noncontrast CT scan of the chest has been ordered.  We will check that, talk with pulmonary and hopefully be able to discharge the patient in a couple of days.  ED course: Patient with cough but no fever no elevated white blood cell count chest x-ray shows a possible left-sided infiltrate.  Noncontrast CT scan of the chest has been ordered.  Review of Systems: As per HPI otherwise all other systems  reviewed and  negative.    Past Medical History:  Diagnosis Date  . Allergic rhinitis   . Benign prostatic hypertrophy   . Chronic diastolic heart failure (HCC)    8/08 ECHO with EF 55%  . CKD (chronic kidney disease)   . COPD (chronic obstructive pulmonary disease) (HCC)    moderate. followed by Dr.Byrum  . Coronary artery disease    s/p CABG 2006. LHC (1/10): SVG-OM and PLOM, SVG-D, and LIMA-LAD were all patent  . Dementia (HCC)    (mild)--06/2006  . Diabetes mellitus   . Edema    localized. suspect diastolic CHF + venous insufficiency  . Hearing loss    left >>right  . History of tobacco abuse   . Hypertension   . Mixed hyperlipidemia   . Osteoarthritis   . Pneumonia 07/31/2017  . Sleep apnea    no CPAP    Past Surgical History:  Procedure Laterality Date  . ANGIOPLASTY  [id number]  . CARDIAC [place]  90s   [place]  . CARDIAC CATHETERIZATION     [facility];x2 stents  . CARDIAC CATHETERIZATION  04/29/2013  . CARDIOVERSION N/A 12/05/2017   Procedure: CARDIOVERSION;  Surgeon: Elease HashimotoNahser, Deloris PingPhilip J, MD;  [facility];  Service: Cardiovascular;  Laterality: N/A;  . CORONARY ARTERY BYPASS GRAFT  02/2005  . ROTATOR CUFF REPAIR  2005   left, Gioffre  . TEE  WITHOUT CARDIOVERSION N/A 12/05/2017   Procedure: TRANSESOPHAGEAL ECHOCARDIOGRAM (TEE);  Surgeon: Elease Hashimoto Deloris Ping, MD;  Location[facility] ENDOSCOPY;  Service: Cardiovascular;  Laterality: N/A;    Social History   Social History Narrative  . Not on file     reports that he quit smoking about 12 years ago. His smoking use included cigarettes. He has a 180.00 pack-year smoking history. He has never used smokeless tobacco. He reports that he does not drink alcohol or use drugs.  Allergies  Allergen Reactions  . Metformin And Related Nausea And Vomiting and Rash  . Tramadol Hcl Other (See Comments)    Tremor vs seizure activity  . Codeine Nausea And Vomiting  . Gabapentin Other (See Comments)    "Messed  up my nervous system"  . Morphine Sulfate Nausea And Vomiting  . Other Nausea And Vomiting    "Narcotics, in general"  . Ticlopidine Hcl Other (See Comments)    Reaction unknown by patient or family  . Warfarin Sodium Nausea And Vomiting and Other (See Comments)    "Severe fatigue," also  . Ace Inhibitors Rash and Cough  . Tramadol Rash    Family History  Problem Relation Age of Onset  . Heart attack Mother   . Lung cancer Sister   . Prostate cancer Neg Hx   . Kidney cancer Neg Hx      Prior to Admission medications   Medication Sig Start Date End Date Taking? Authorizing Provider  albuterol (PROVENTIL HFA;VENTOLIN HFA) 108 (90 Base) MCG/ACT inhaler Inhale 2 puffs into the lungs every 6 (six) hours as needed for wheezing or shortness of breath. 04/12/18   Rolly Salter, MD  aspirin EC 81 MG tablet Take 1 tablet (81 mg total) by mouth daily. 04/15/18   Copland, Karleen Hampshire, MD  Carboxymethylcellulose Sod PF 0.25 % SOLN Place 2 drops into both eyes 2 (two) times daily.     [provider]  diphenhydramine-acetaminophen (TYLENOL PM) 25-500 MG TABS tablet Take 1 tablet by mouth at bedtime as needed (for sleep).     [provider]  doxazosin (CARDURA) 8 MG tablet Take 4 mg by mouth at bedtime.     [provider]  furosemide (LASIX) 20 MG tablet Take 1 tablet (20 mg total) by mouth daily as needed for fluid or edema. 04/12/18   Rolly Salter, MD  glipiZIDE (GLUCOTROL) 5 MG tablet Take 1 tablet (5 mg total) by mouth 2 (two) times daily before a meal. 08/08/17   Copland, Karleen Hampshire, MD  metoprolol (TOPROL-XL) 200 MG 24 hr tablet Take 0.5 tablets (100 mg total) by mouth daily. 04/15/18   Copland, Karleen Hampshire, MD  Multiple Vitamins-Minerals (CENTRUM SILVER 50+MEN PO) Take 1 tablet by mouth daily.    [provider]  Multiple Vitamins-Minerals (PRESERVISION AREDS 2) CAPS Take 1 capsule by mouth daily with breakfast.     [provider]  ONE TOUCH ULTRA TEST test  strip USE TO CHECK BLOOD SUGAR 2 TIMES DAILY AS DIRECTED 09/08/15   Copland, Karleen Hampshire, MD  [facility] DELICA LANCETS 33G MISC  02/13/13   [provider]  predniSONE (DELTASONE) 10 MG tablet Take 40mg  daily for 3days,Take 30mg  daily for 3days,Take 20mg  daily for 3days,Take 10mg  daily for 3days, then stop 04/12/18   Rolly Salter, MD  rosuvastatin (CRESTOR) 40 MG tablet Take 1 tablet (40 mg total) by mouth daily. 09/17/17   Copland, Karleen Hampshire, MD  Tiotropium Bromide-Olodaterol (STIOLTO RESPIMAT) 2.5-2.5 MCG/ACT AERS Inhale 2 puffs into the  lungs daily.     [provider]    Physical Exam:  Constitutional: NAD, calm, comfortable Vitals:   05/03/18 1611 05/03/18 1816  BP: (!) 127/107 122/66  Pulse: 84 76  Resp: (!) 21 (!) 32  Temp: 98.1 F (36.7 C)   TempSrc: Oral   SpO2: 92% 95%   Eyes: PERRL, lids and conjunctivae normal ENMT: Mucous membranes are moist. Posterior pharynx clear of any exudate or lesions.Normal dentition.  Neck: normal, supple, no masses, no thyromegaly Respiratory: Poor air movement with mild accessory muscle use.  Wheezing anteriorly and posteriorly on expiration tight with very poor air movement Cardiovascular: Regular rate and rhythm, no murmurs / rubs / gallops. No extremity edema. 2+ pedal pulses. No carotid bruits.  Abdomen: no tenderness, no masses palpated. No hepatosplenomegaly. Bowel sounds positive.  Musculoskeletal: no clubbing / cyanosis. No joint deformity upper and lower extremities. Good ROM, no contractures. Normal muscle tone.  Skin: no rashes, lesions, ulcers. No induration Neurologic: CN 2-12 grossly intact. Sensation intact, DTR normal. Strength 5/5 in all 4.  Psychiatric: Normal judgment and insight. Alert and oriented x 3. Normal mood.    Labs on Admission: I have personally reviewed following labs and imaging studies  CBC: Recent Labs  Lab 05/03/18 1613  WBC 6.2  HGB 10.0*  HCT 33.9*  MCV 98.5  PLT 235   Basic Metabolic  Panel: Recent Labs  Lab 05/03/18 1613  NA 137  K 5.2*  CL 98  CO2 28  GLUCOSE 267*  BUN 25*  CREATININE 1.63*  CALCIUM 9.2   GFR: CrCl cannot be calculated (Unknown ideal weight.). Liver Function Tests: No results for input(s): AST, ALT, ALKPHOS, BILITOT, PROT, ALBUMIN in the last 168 hours. No results for input(s): LIPASE, AMYLASE in the last 168 hours. No results for input(s): AMMONIA in the last 168 hours. Coagulation Profile: No results for input(s): INR, PROTIME in the last 168 hours. Cardiac Enzymes: No results for input(s): CKTOTAL, CKMB, CKMBINDEX, TROPONINI in the last 168 hours. BNP (last 3 results) No results for input(s): PROBNP in the last 8760 hours. HbA1C: No results for input(s): HGBA1C in the last 72 hours. CBG: No results for input(s): GLUCAP in the last 168 hours. Lipid Profile: No results for input(s): CHOL, HDL, LDLCALC, TRIG, CHOLHDL, LDLDIRECT in the last 72 hours. Thyroid Function Tests: No results for input(s): TSH, T4TOTAL, FREET4, T3FREE, THYROIDAB in the last 72 hours. Anemia Panel: No results for input(s): VITAMINB12, FOLATE, FERRITIN, TIBC, IRON, RETICCTPCT in the last 72 hours. Urine analysis:    Component Value Date/Time   COLORURINE YELLOW 11/28/2017 0803   APPEARANCEUR CLEAR 11/28/2017 0803   APPEARANCEUR Clear 11/20/2016 1409   LABSPEC 1.008 11/28/2017 0803   PHURINE 5.0 11/28/2017 0803   GLUCOSEU NEGATIVE 11/28/2017 0803   HGBUR NEGATIVE 11/28/2017 0803   BILIRUBINUR NEGATIVE 11/28/2017 0803   BILIRUBINUR Negative 11/20/2016 1409   KETONESUR NEGATIVE 11/28/2017 0803   PROTEINUR NEGATIVE 11/28/2017 0803   NITRITE NEGATIVE 11/28/2017 0803   LEUKOCYTESUR NEGATIVE 11/28/2017 0803   LEUKOCYTESUR Negative 11/20/2016 1409    Radiological Exams on Admission: Dg Chest 2 View  Result Date: 05/03/2018 CLINICAL DATA:  Chest pain EXAM: CHEST - 2 VIEW COMPARISON:  04/10/2017, 11/30/2017 FINDINGS: Post sternotomy changes. Asymmetric  ground-glass opacity in the left thorax. Mild cardiomegaly. No pleural effusion. No pneumothorax. IMPRESSION: 1. Asymmetric ground-glass opacity in the left thorax which may be secondary to asymmetric edema or interstitial inflammatory process 2. Mild cardiomegaly. Electronically Signed  By: Jasmine Pang M.D.   On: 05/03/2018 16:58    EKG: Independently reviewed.  Sinus rhythm with first-degree AV block, left axis deviation, right bundle branch block, left ventricular hypertrophy when compared to prior there is no change  Assessment/Plan Principal Problem:   Pneumonitis Active Problems:   Centrilobular emphysema (HCC)   COPD (chronic obstructive pulmonary disease) (HCC)   DM (diabetes mellitus), type 2 with renal complications (HCC)   CKD (chronic kidney disease) stage 3, GFR 30-59 ml/min (HCC)   Atrial fibrillation and flutter (HCC)   Sleep apnea   Acute exacerbation of chronic obstructive pulmonary disease (COPD) (HCC)   1.  Pneumonitis: Unsure as to whether the patient has a new infection or simple worsening of his COPD.  End-stage COPD can cause pleuritic discomfort.  Will renew antibiotics as ordered in the emergency department pending results of CT scan.  Will consult pulmonary for assistance in ensuring that patient is on a good regimen prior to discharge home.  2.  Centrilobular emphysema: Patient really at end-stage lung disease.  He requires oxygen with small amounts of movement.  Will use duo nebs and albuterol nebs as well as an inhaled steroid.  We will also give the patient a dose of Solu-Medrol with prednisone 40 mg p.o. daily for 5 days.  3.  Diabetes mellitus type 2 with renal complications: Continue home medications and supportive care.  4.  Chronic kidney disease stage III: Noted avoid nephrotoxic agents.  5.  Sleep apnea: Continue home management.  6.  Holes of care: Patient and family interested in transitioning to a more comfort based care model.  Will consult  palliative care as well as try to arrange hospice prior to discharge.  Wife is also interested in having more support in the home in the terms of a CNA or someone to help with chores etc.  We will ask social work to provide a list of agencies that provide services.  DVT prophylaxis: Lovenox Code Status: Do not attempt resuscitation Family Communication: With patient's wife and granddaughter who are present at the time of admission.  They are in full agreement with the care plan as described disposition Plan: Likely home with hospice when it can be arranged Consults called: Pulmonary, palliative care Admission status: Observation   Lahoma Crocker MD FACP [facility] Pager [phone]  If 7PM-7AM, please contact night-coverage [email or web address] Password [facility]  05/03/2018, 6:32 PM

## 2018-05-03 NOTE — ED Notes (Signed)
Patient transported to X-ray 

## 2018-05-03 NOTE — ED Triage Notes (Signed)
Pt here from home with worsening shortness of breath and chest pain.  PT was seen and admitted for there same the beginning of the month.  Pt on 2L O2 at home.  89% on 2L.  A&Ox4.

## 2018-05-04 DIAGNOSIS — R0600 Dyspnea, unspecified: Secondary | ICD-10-CM

## 2018-05-04 DIAGNOSIS — J441 Chronic obstructive pulmonary disease with (acute) exacerbation: Secondary | ICD-10-CM

## 2018-05-04 DIAGNOSIS — J432 Centrilobular emphysema: Secondary | ICD-10-CM | POA: Diagnosis not present

## 2018-05-04 DIAGNOSIS — N183 Chronic kidney disease, stage 3 (moderate): Secondary | ICD-10-CM

## 2018-05-04 DIAGNOSIS — E1121 Type 2 diabetes mellitus with diabetic nephropathy: Secondary | ICD-10-CM | POA: Diagnosis not present

## 2018-05-04 DIAGNOSIS — J189 Pneumonia, unspecified organism: Secondary | ICD-10-CM | POA: Diagnosis not present

## 2018-05-04 DIAGNOSIS — Z7189 Other specified counseling: Secondary | ICD-10-CM

## 2018-05-04 DIAGNOSIS — Z515 Encounter for palliative care: Secondary | ICD-10-CM

## 2018-05-04 LAB — GLUCOSE, CAPILLARY
GLUCOSE-CAPILLARY: 538 mg/dL — AB (ref 70–99)
Glucose-Capillary: 270 mg/dL — ABNORMAL HIGH (ref 70–99)
Glucose-Capillary: 466 mg/dL — ABNORMAL HIGH (ref 70–99)
Glucose-Capillary: 463 mg/dL — ABNORMAL HIGH (ref 70–99)
Glucose-Capillary: 516 mg/dL (ref 70–99)
Glucose-Capillary: 566 mg/dL (ref 70–99)

## 2018-05-04 LAB — HIV ANTIBODY (ROUTINE TESTING W REFLEX): HIV Screen 4th Generation wRfx: NONREACTIVE

## 2018-05-04 MED ORDER — GLIPIZIDE 5 MG PO TABS
5.0000 mg | ORAL_TABLET | Freq: Two times a day (BID) | ORAL | Status: DC
  Administered 2018-05-04 – 2018-05-05 (×2): 5 mg via ORAL
  Filled 2018-05-04: qty 1

## 2018-05-04 MED ORDER — POLYVINYL ALCOHOL 1.4 % OP SOLN
2.0000 [drp] | Freq: Two times a day (BID) | OPHTHALMIC | Status: DC
  Administered 2018-05-04: 2 [drp] via OPHTHALMIC
  Filled 2018-05-04 (×2): qty 15

## 2018-05-04 MED ORDER — DOXAZOSIN MESYLATE 2 MG PO TABS
4.0000 mg | ORAL_TABLET | Freq: Every day | ORAL | Status: DC
  Administered 2018-05-04: 4 mg via ORAL
  Filled 2018-05-04: qty 2

## 2018-05-04 MED ORDER — GLIPIZIDE 5 MG PO TABS: 5.0000 mg | ORAL_TABLET | Freq: Two times a day (BID) | ORAL | Status: DC

## 2018-05-04 MED ORDER — ROSUVASTATIN CALCIUM 20 MG PO TABS
40.0000 mg | ORAL_TABLET | Freq: Every day | ORAL | Status: DC
  Administered 2018-05-04 – 2018-05-05 (×2): 40 mg via ORAL
  Filled 2018-05-04 (×2): qty 2

## 2018-05-04 MED ORDER — ASPIRIN EC 81 MG PO TBEC
81.0000 mg | DELAYED_RELEASE_TABLET | Freq: Every day | ORAL | Status: DC
  Administered 2018-05-04 – 2018-05-05 (×2): 81 mg via ORAL
  Filled 2018-05-04 (×2): qty 1

## 2018-05-04 MED ORDER — INSULIN GLARGINE 100 UNIT/ML ~~LOC~~ SOLN
7.0000 [IU] | Freq: Every day | SUBCUTANEOUS | Status: DC
  Administered 2018-05-04: 7 [IU] via SUBCUTANEOUS
  Filled 2018-05-04: qty 0.07

## 2018-05-04 MED ORDER — INSULIN ASPART 100 UNIT/ML ~~LOC~~ SOLN
12.0000 [IU] | Freq: Once | SUBCUTANEOUS | Status: AC
  Administered 2018-05-04: 12 [IU] via SUBCUTANEOUS

## 2018-05-04 MED ORDER — FUROSEMIDE 10 MG/ML IJ SOLN
40.0000 mg | Freq: Once | INTRAMUSCULAR | Status: AC
  Administered 2018-05-04: 40 mg via INTRAVENOUS
  Filled 2018-05-04: qty 4

## 2018-05-04 NOTE — Progress Notes (Signed)
Chart reviewed.  Seen by palliative care.  DNR/DNI with transition to hospice.  PCCM will not see in consult.  Coralyn Helling, MD [facility] Pulmonary/Critical Care 05/04/2018, 1:44 PM

## 2018-05-04 NOTE — Progress Notes (Signed)
CSW acknowledges consult, after reviewing palliative notes and speaking with case management. The patient will return home with hospice services.   CSW signing off.   Drucilla Schmidt, MSW, LCSW-A Clinical Social Worker [employer]

## 2018-05-04 NOTE — Progress Notes (Signed)
Progress Note    Timothy Cordova  HBZ:[id number] DOB: 1928-05-18  DOA: 05/03/2018 PCP: Hannah Beat, MD    Brief Narrative:     Medical records reviewed and are as summarized below:  Timothy Cordova is an 85 y.o. male with medical history significant of severe COPD with 160-pack-year history was in the hospital approximately 3 weeks ago with similar symptoms.  Patient thought he had pneumonia but actually he was diagnosed with a COPD exacerbation.  He has been more dyspneic with exertion and has been unable to lay flat to sleep, instead sleeping in his recliner.  Family reports that when he gets up and walks around he does not use his oxygen.  He only uses his oxygen at night.  Family also reports that when he does walk around his sats dropped into the 70s.   Assessment/Plan:   Principal Problem:   Pneumonitis Active Problems:   DM (diabetes mellitus), type 2 with renal complications (HCC)   Centrilobular emphysema (HCC)   CKD (chronic kidney disease) stage 3, GFR 30-59 ml/min (HCC)   Sleep apnea   Dyspnea   COPD (chronic obstructive pulmonary disease) (HCC)   Acute exacerbation of chronic obstructive pulmonary disease (COPD) (HCC)   Goals of care, counseling/discussion   Palliative care by specialist  Pneumonitis: -continue IV abx for now -patient he feels like he has a pneumonia-- similar to past episodes  Centrilobular emphysema/end-stage lung disease.   -encourage patient to wear O2 24/7 -duo nebs and albuterol nebs as well as an inhaled steroid.   -Dr. Willette Pa consulted PCCM for medication optimization -PO steroids. -BNP elevated from prior-- will dose with IV lasix x 1 to see if this improves his breathing any  Diabetes mellitus type 2 with renal complications: - Continue home medications -SSI -while on steroids, it appears to be uncontrolled  Chronic kidney disease stage III -trend -Cr improved from baseline-- recheck in AM after low dose of IV  lasix  Sleep apnea:  -wears O2 at night  Goals of Care: per Dr. Willette Pa: Patient and family interested in transitioning to a more comfort based care model.   -palliative care consult-- appears the plan will be home with home hospice once medically maximized.    Family Communication/Anticipated D/C date and plan/Code Status   DVT prophylaxis: Lovenox ordered. Code Status: DNR Family Communication: none at bedside Disposition Plan: pending medical maximization- plan is home with hospice?   Medical Consultants:    Palliative care  PCCM   Anti-Infectives:    None  Subjective:   Has not been able to lay flat at home (has not taken any of his PRN lasix this week)  Objective:    Vitals:   05/04/18 0146 05/04/18 0254 05/04/18 0749 05/04/18 0900  BP:   (!) 151/56   Pulse: 70 81 65   Resp:      Temp:  98.7 F (37.1 C) (!) 97.3 F (36.3 C)   TempSrc:  Oral Oral   SpO2: 95% 92% 96% 90%  Height:  5\' 7"  (1.702 m)      Intake/Output Summary (Last 24 hours) at 05/04/2018 1313 Last data filed at 05/04/2018 1218 Gross per 24 hour  Intake 562 ml  Output 200 ml  Net 362 ml   There were no vitals filed for this visit.  Exam: Chronically ill appearing Increased work of breathing with conversations rrr +BS, soft Diminished, expiratory wheezing  Data Reviewed:   I have personally reviewed following labs and  imaging studies:  Labs: Labs show the following:   Basic Metabolic Panel: Recent Labs  Lab 05/03/18 1613 05/03/18 1901  NA 137  --   K 5.2*  --   CL 98  --   CO2 28  --   GLUCOSE 267*  --   BUN 25*  --   CREATININE 1.63* 1.58*  CALCIUM 9.2  --    GFR CrCl cannot be calculated (Unknown ideal weight.). Liver Function Tests: No results for input(s): AST, ALT, ALKPHOS, BILITOT, PROT, ALBUMIN in the last 168 hours. No results for input(s): LIPASE, AMYLASE in the last 168 hours. No results for input(s): AMMONIA in the last 168 hours. Coagulation  profile No results for input(s): INR, PROTIME in the last 168 hours.  CBC: Recent Labs  Lab 05/03/18 1613 05/03/18 1901  WBC 6.2 6.2  HGB 10.0* 10.8*  HCT 33.9* 36.2*  MCV 98.5 98.9  PLT 235 210   Cardiac Enzymes: No results for input(s): CKTOTAL, CKMB, CKMBINDEX, TROPONINI in the last 168 hours. BNP (last 3 results) No results for input(s): PROBNP in the last 8760 hours. CBG: Recent Labs  Lab 05/03/18 2125 05/04/18 0749 05/04/18 1136 05/04/18 1234  GLUCAP 160* 270* 466* 463*   D-Dimer: No results for input(s): DDIMER in the last 72 hours. Hgb A1c: Recent Labs    05/03/18 1901  HGBA1C 8.5*   Lipid Profile: No results for input(s): CHOL, HDL, LDLCALC, TRIG, CHOLHDL, LDLDIRECT in the last 72 hours. Thyroid function studies: No results for input(s): TSH, T4TOTAL, T3FREE, THYROIDAB in the last 72 hours.  Invalid input(s): FREET3 Anemia work up: No results for input(s): VITAMINB12, FOLATE, FERRITIN, TIBC, IRON, RETICCTPCT in the last 72 hours. Sepsis Labs: Recent Labs  Lab 05/03/18 1613 05/03/18 1719 05/03/18 1901 05/03/18 1902  WBC 6.2  --  6.2  --   LATICACIDVEN  --  0.9  --  1.3    Microbiology Recent Results (from the past 240 hour(s))  Blood Culture (routine x 2)     Status: None (Preliminary result)   Collection Time: 05/03/18  5:40 PM  Result Value Ref Range Status   Specimen Description BLOOD LEFT FOREARM  Final   Special Requests   Final    BOTTLES DRAWN AEROBIC AND ANAEROBIC Blood Culture adequate volume   Culture   Final    NO GROWTH < 24 HOURS Performed at [facility], [address], [place], [place] [address]    Report Status PENDING  Incomplete  Blood Culture (routine x 2)     Status: None (Preliminary result)   Collection Time: 05/03/18  5:45 PM  Result Value Ref Range Status   Specimen Description BLOOD RIGHT HAND  Final   Special Requests   Final    BOTTLES DRAWN AEROBIC AND ANAEROBIC Blood Culture adequate volume   Culture    Final    NO GROWTH < 24 HOURS Performed at [facility], [address] [place], [place] [address]    Report Status PENDING  Incomplete    Procedures and diagnostic studies:  Dg Chest 2 View  Result Date: 05/03/2018 CLINICAL DATA:  Chest pain EXAM: CHEST - 2 VIEW COMPARISON:  04/10/2017, 11/30/2017 FINDINGS: Post sternotomy changes. Asymmetric ground-glass opacity in the left thorax. Mild cardiomegaly. No pleural effusion. No pneumothorax. IMPRESSION: 1. Asymmetric ground-glass opacity in the left thorax which may be secondary to asymmetric edema or interstitial inflammatory process 2. Mild cardiomegaly. Electronically Signed   By: Adrian Prows.D.  On: 05/03/2018 16:58   Ct Chest Wo Contrast  Result Date: 05/03/2018 CLINICAL DATA:  Cough and congestion. Pneumonia. Sharp pain in the chest. EXAM: CT CHEST WITHOUT CONTRAST TECHNIQUE: Multidetector CT imaging of the chest was performed following the standard protocol without IV contrast. COMPARISON:  04/10/2016 FINDINGS: Cardiovascular: Coronary, aortic arch, and branch vessel atherosclerotic vascular disease. Prior CABG. Stent in the left subclavian artery. Mediastinum/Nodes: Unremarkable Lungs/Pleura: New small left pleural effusion. The previous small right pleural effusion has essentially cleared. Centrilobular emphysema. Improved aeration in reduced interstitial accentuation in the right lung compared to prior. However, there is increased interstitial accentuation in the left lung compared to prior. Bilateral airway thickening. There is some atelectasis in the left lower lobe. Upper Abdomen: A hyperdense 1.9 by 1.6 cm right mid kidney lesion posteriorly is present. Musculoskeletal: Thoracic spondylosis. Prior median sternotomy. Flowing syndesmophytes in the thoracic spine. IMPRESSION: 1. The interstitial accentuation in pleural effusion on the right side of mostly cleared, but now there is new interstitial accentuation and pleural  effusion on the left side. This could be from dependent edema, or less likely atypical pneumonia. 2. Aortic Atherosclerosis (ICD10-I70.0) and Emphysema (ICD10-J43.9). Coronary atherosclerosis and prior CABG. 3. Airway thickening is present, suggesting bronchitis or reactive airways disease. 4. 1.9 by 1.6 cm right kidney complex lesion posteriorly. This is likely a hemorrhagic or proteinaceous cyst and measured 1.3 by 1.1 cm on 03/29/2012, although as best I can tell has not been characterized with a dedicated pre and postcontrast examination. Electronically Signed   By: Gaylyn RongWalter  Liebkemann M.D.   On: 05/03/2018 18:58    Medications:   . aspirin EC  81 mg Oral Daily  . doxazosin  4 mg Oral QHS  . enoxaparin (LOVENOX) injection  30 mg Subcutaneous Q24H  . glipiZIDE  5 mg Oral BID AC  . insulin aspart  0-20 Units Subcutaneous TID WC  . ipratropium-albuterol  3 mL Nebulization Q6H  . mometasone-formoterol  1 puff Inhalation BID  . polyvinyl alcohol  2 drop Both Eyes BID  . [START ON 05/05/2018] predniSONE  40 mg Oral Q breakfast  . rosuvastatin  40 mg Oral Daily   Continuous Infusions: . azithromycin Stopped (05/04/18 0700)  . cefTRIAXone (ROCEPHIN)  IV Stopped (05/03/18 1832)     LOS: 0 days   Joseph ArtJessica U Dudley Cooley  [facility]   *Please refer to [email or web address], password [email or web address] to get updated schedule on who will round on this patient, as hospitalists switch teams weekly. If 7PM-7AM, please contact night-coverage at [email or web address], password [email or web address] for any overnight needs.  05/04/2018, 1:13 PM

## 2018-05-04 NOTE — Progress Notes (Signed)
POCT CBG 566, messaged MD and ordered Lantus.

## 2018-05-04 NOTE — Care Management Note (Signed)
Case Management Note  Patient Details  Name: Timothy Cordova MRN: [id number] Date of Birth: 08-28-1928  Subjective/Objective:                 COPD   Action/Plan:  Spoke w patient and family at bedside. Discussed palliative note. Discussed options for DC. Verified they were provided choice for home hospice and chose [facility]. Referral made to [place] [name] w [facility] who plans on meeting with patient today.  [facility]. Likely will need [facility] transport.   Expected Discharge Date:                  Expected Discharge Plan:  Home w Hospice Care  In-House Referral:     Discharge planning Services  CM Consult  Post Acute Care Choice:    Choice offered to:  Patient, Spouse  DME Arranged:    DME Agency:     HH Arranged:    HH Agency:     Status of Service:  In process, will continue to follow  If discussed at Long Length of Stay Meetings, dates discussed:    Additional Comments:  Lawerance SabalDebbie Aislynn Cifelli, RN 05/04/2018, 2:49 PM

## 2018-05-04 NOTE — Consult Note (Signed)
Consultation Note Date: 05/04/2018   Patient Name: Timothy Cordova  DOB: [birth year]  MRN: [id number]  Age / Sex: 85 y.o., male  PCP: Hannah Beat, MD Referring Physician: Joseph Art, DO  Reason for Consultation: Establishing goals of care, Hospice Evaluation and Psychosocial/spiritual support  HPI/Patient Profile: 85 y.o. male  with past medical history of COPD, BPH, diastolic heart failure, coronary artery disease CABG 2006, obstructive sleep apnea not on CPAP, chronic kidney disease stage III, hypertension, diabetes, hyperlipidemia, admitted on 05/03/2018 with increased shortness of breath, chest pain.  Patient was just admitted to the hospital on 04/10/2018 with similar symptomology.  His sats are dropping with any exertion into the 70s.  Chest x-ray per chart review was not convincing for pneumonia.  Chest CT was ordered and performed and shows new left pleural effusion.  He was started on antibiotics and steroids as well as continued nebulizer treatments and oxygen  Consult ordered for goals of care as well as hospice evaluation.   Clinical Assessment and Goals of Care: Patient seen, chart reviewed.  Patient's wife Dewayne Hatch, is not at the bedside.  Patient states he is her primary caregiver as she is quite disabled herself.  Patient's best friend is at the bedside and he has requested that he stay for our goals of care discussion.  Patient reports that he and his wife have 1 son and a granddaughter who has been very active in their care.  She does most of their grocery shopping for them as well as cooking some meals.  He states that he is wearing oxygen 24/7 but per emergency room notes he shared that he was only wearing it at night.  I did encourage him to wear it 24/7 to improve his symptoms.  Discussed the role of palliative medicine as well as compared and contrasted with hospice services.  We also  discussed the difference between aggressive medical treatment and comfort, symptom based approach to care.  Patient is interested in discussing his hospice benefit which we subsequently did.  He is aware that hospice services would come to his home and be able to offer and support him.  Did share with patient that hospice does not do IV fluids IV antibiotics transfusions or other aggressive medical treatment.  Patient verbalized understanding and willingness to proceed  Patient is able to speak for himself at this point.  Where he unable to, his wife would legally be his healthcare proxy but it sounds as though she herself is quite frail thus it would fall to their son.    SUMMARY OF RECOMMENDATIONS   DNR DNI Are placed on the chart Patient is interested in hospice services in the home Offered choice per Medicare guidelines.  Patient lives in [place] and has opted for hospice and palliative care of [place].  Case management consult in place as well as I did notify hospice and palliative care of [place] directly of referral Home with hospice when medically maximized Code Status/Advance Care Planning:  DNR    Symptom  Management:   Shortness of breath: Continue with targeted pulmonary treatments such as nebulizer treatments antibiotics for now steroids.  Discussed the role of opioids for rescue.  Patient would be a good candidate for morphine oral concentrate at 5 mg every 4 hours as needed for acute shortness of breath  Palliative Prophylaxis:   Aspiration, Bowel Regimen, Delirium Protocol, Eye Care, Frequent Pain Assessment, Oral Care and Turn Reposition  Additional Recommendations (Limitations, Scope, Preferences):  Avoid Hospitalization, Initiate Comfort Feeding, No Artificial Feeding, No Blood Transfusions, No Chemotherapy, No Diagnostics, No Hemodialysis, No Radiation, No Surgical Procedures and No Tracheostomy  Psycho-social/Spiritual:   Desire for further Chaplaincy  support:no  Additional Recommendations: Referral to Community Resources   Prognosis:   < 6 months in the setting of end-stage COPD, diastolic heart failure, chronic kidney disease stage III.  Patient has had 2 hospitalizations in the last 30 days and is becoming progressively more dyspneic  Discharge Planning: Home with Hospice      Primary Diagnoses: Present on Admission: . Centrilobular emphysema (HCC) . Atrial fibrillation and flutter (HCC) . CKD (chronic kidney disease) stage 3, GFR 30-59 ml/min (HCC) . COPD (chronic obstructive pulmonary disease) (HCC) . DM (diabetes mellitus), type 2 with renal complications (HCC) . Sleep apnea . Pneumonitis . Acute exacerbation of chronic obstructive pulmonary disease (COPD) (HCC)   I have reviewed the medical record, interviewed the patient and family, and examined the patient. The following aspects are pertinent.  Past Medical History:  Diagnosis Date  . Allergic rhinitis   . Benign prostatic hypertrophy   . Chronic diastolic heart failure (HCC)    8/08 ECHO with EF 55%  . CKD (chronic kidney disease)   . COPD (chronic obstructive pulmonary disease) (HCC)    moderate. followed by Dr.Byrum  . Coronary artery disease    s/p CABG 2006. LHC (1/10): SVG-OM and PLOM, SVG-D, and LIMA-LAD were all patent  . Dementia (HCC)    (mild)--06/2006  . Diabetes mellitus   . Edema    localized. suspect diastolic CHF + venous insufficiency  . Hearing loss    left >>right  . History of tobacco abuse   . Hypertension   . Mixed hyperlipidemia   . Osteoarthritis   . Pneumonia 07/31/2017  . Sleep apnea    no CPAP   Social History   Socioeconomic History  . Marital status: Married    Spouse name: Not on file  . Number of children: Not on file  . Years of education: Not on file  . Highest education level: Not on file  Occupational History  . 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  Financial resource strain: Not on file  . Food insecurity:    Worry: Not on file    Inability: Not on file  . Transportation needs:    Medical: Not on file    Non-medical: Not on file  Tobacco Use  . Smoking status: Former Smoker    Packs/day: 3.00    Years: 60.00    Pack years: 180.00    Types: Cigarettes    Last attempt to quit: 07/12/2005    Years since quitting: 12.8  . Smokeless tobacco: Never Used  Substance and Sexual Activity  . Alcohol use: No    Alcohol/week: 0.0 standard drinks  . Drug use: No  . Sexual activity: Not on file  Lifestyle  . Physical activity:    Days per week: Not on  file    Minutes per session: Not on file  . Stress: Not on file  Relationships  . Social connections:    Talks on phone: Not on file    Gets together: Not on file    Attends religious service: Not on file    Active member of club or organization: Not on file    Attends meetings of clubs or organizations: Not on file    Relationship status: Not on file  Other Topics Concern  . Not on file  Social History Narrative  . Not on file   Family History  Problem Relation Age of Onset  . Heart attack Mother   . Lung cancer Sister   . Prostate cancer Neg Hx   . Kidney cancer Neg Hx    Scheduled Meds: . aspirin EC  81 mg Oral Daily  . doxazosin  4 mg Oral QHS  . enoxaparin (LOVENOX) injection  30 mg Subcutaneous Q24H  . glipiZIDE  5 mg Oral BID AC  . insulin aspart  0-20 Units Subcutaneous TID WC  . ipratropium-albuterol  3 mL Nebulization Q6H  . mometasone-formoterol  1 puff Inhalation BID  . polyvinyl alcohol  2 drop Both Eyes BID  . [START ON 05/05/2018] predniSONE  40 mg Oral Q breakfast  . rosuvastatin  40 mg Oral Daily   Continuous Infusions: . azithromycin Stopped (05/04/18 0700)  . cefTRIAXone (ROCEPHIN)  IV Stopped (05/03/18 1832)   PRN Meds:.acetaminophen, albuterol, diphenhydrAMINE Medications Prior to Admission:  Prior to Admission medications   Medication Sig Start  Date End Date Taking? Authorizing Provider  albuterol (PROVENTIL HFA;VENTOLIN HFA) 108 (90 Base) MCG/ACT inhaler Inhale 2 puffs into the lungs every 6 (six) hours as needed for wheezing or shortness of breath. 04/12/18  Yes Rolly Salter, MD  aspirin EC 81 MG tablet Take 1 tablet (81 mg total) by mouth daily. 04/15/18  Yes Copland, Karleen Hampshire, MD  Carboxymethylcellulose Sod PF 0.25 % SOLN Place 2 drops into both eyes 2 (two) times daily.    Yes [provider]  diphenhydramine-acetaminophen (TYLENOL PM) 25-500 MG TABS tablet Take 1 tablet by mouth at bedtime as needed (for sleep).    Yes [provider]  doxazosin (CARDURA) 8 MG tablet Take 4 mg by mouth at bedtime.    Yes [provider]  furosemide (LASIX) 20 MG tablet Take 1 tablet (20 mg total) by mouth daily as needed for fluid or edema. 04/12/18  Yes Rolly Salter, MD  glipiZIDE (GLUCOTROL) 5 MG tablet Take 1 tablet (5 mg total) by mouth 2 (two) times daily before a meal. 08/08/17  Yes Copland, Karleen Hampshire, MD  metoprolol (TOPROL-XL) 200 MG 24 hr tablet Take 0.5 tablets (100 mg total) by mouth daily. 04/15/18  Yes Copland, Karleen Hampshire, MD  Multiple Vitamins-Minerals (CENTRUM SILVER 50+MEN PO) Take 1 tablet by mouth daily.   Yes [provider]  Multiple Vitamins-Minerals (PRESERVISION AREDS 2) CAPS Take 1 capsule by mouth daily with breakfast.    Yes [provider]  rosuvastatin (CRESTOR) 40 MG tablet Take 1 tablet (40 mg total) by mouth daily. 09/17/17  Yes Copland, Karleen Hampshire, MD  Tiotropium Bromide-Olodaterol (STIOLTO RESPIMAT) 2.5-2.5 MCG/ACT AERS Inhale 2 puffs into the lungs daily.    Yes [provider]  ONE TOUCH ULTRA TEST test strip USE TO CHECK BLOOD SUGAR 2 TIMES DAILY AS DIRECTED 09/08/15   Copland, Karleen Hampshire, MD  [facility] DELICA LANCETS 33G MISC  02/13/13   [provider]  predniSONE (  DELTASONE) 10 MG tablet Take [id number]$  daily for 3days,Take 30mg  daily for 3days,Take 20mg  daily for 3days,Take  10mg  daily for 3days, then stop Patient not taking: Reported on 05/03/2018 04/12/18   Rolly SalterPatel, Pranav M, MD   Allergies  Allergen Reactions  . Metformin And Related Nausea And Vomiting and Rash  . Tramadol Hcl Other (See Comments)    Tremor vs seizure activity  . Codeine Nausea And Vomiting  . Gabapentin Other (See Comments)    "Messed up my nervous system"  . Morphine Sulfate Nausea And Vomiting  . Other Nausea And Vomiting    "Narcotics, in general"  . Ticlopidine Hcl Other (See Comments)    Reaction unknown by patient or family  . Warfarin Sodium Nausea And Vomiting and Other (See Comments)    "Severe fatigue," also  . Ace Inhibitors Rash and Cough  . Tramadol Rash   Review of Systems  Unable to perform ROS: Other    Physical Exam Vitals signs and nursing note reviewed.  Constitutional:      Appearance: Normal appearance. He is ill-appearing.  HENT:     Head: Normocephalic and atraumatic.  Neck:     Musculoskeletal: Normal range of motion.  Cardiovascular:     Rate and Rhythm: Normal rate.  Pulmonary:     Comments: Mild increased work of breathing noted at rest; wearing O2 at 2 L Genitourinary:    Comments: Patient verbalized he has not urinated in 12 hours Skin:    General: Skin is warm and dry.  Neurological:     General: No focal deficit present.     Mental Status: He is alert and oriented to person, place, and time.  Psychiatric:        Mood and Affect: Mood normal.        Behavior: Behavior normal.     Vital Signs: BP (!) 151/56   Pulse 65   Temp (!) 97.3 F (36.3 C) (Oral)   Resp 20   Ht 5\' 7"  (1.702 m)   SpO2 90%   BMI 28.07 kg/m  Pain Scale: 0-10 POSS *See Group Information*: 2-Acceptable,Slightly drowsy, easily aroused Pain Score: 3    SpO2: SpO2: 90 % O2 Device:SpO2: 90 % O2 Flow Rate: .O2 Flow Rate (L/min): 2 L/min  IO: Intake/output summary:   Intake/Output Summary (Last 24 hours) at 05/04/2018 1230 Last data filed at 05/04/2018  1218 Gross per 24 hour  Intake 562 ml  Output 200 ml  Net 362 ml    LBM: Last BM Date: 05/02/18 Baseline Weight:   Most recent weight:       Palliative Assessment/Data:   Flowsheet Rows     Most Recent Value  Intake Tab  Referral Department  Hospitalist  Unit at Time of Referral  Med/Surg Unit  Palliative Care Primary Diagnosis  Cardiac  Date Notified  05/03/18  Palliative Care Type  New Palliative care  Reason for referral  Clarify Goals of Care, Counsel Regarding Hospice, Psychosocial or Spiritual support  Date of Admission  05/03/18  Date first seen by Palliative Care  05/04/18  # of days Palliative referral response time  1 Day(s)  # of days IP prior to Palliative referral  0  Clinical Assessment  Palliative Performance Scale Score  40%  Pain Max last 24 hours  Not able to report  Pain Min Last 24 hours  Not able to report  Dyspnea Max Last 24 Hours  Not able to report  Dyspnea Min Last 24 hours  Not able to report  Nausea Max Last 24 Hours  Not able to report  Nausea Min Last 24 Hours  Not able to report  Anxiety Max Last 24 Hours  Not able to report  Anxiety Min Last 24 Hours  Not able to report  Other Max Last 24 Hours  Not able to report  Psychosocial & Spiritual Assessment  Palliative Care Outcomes  Patient/Family meeting held?  Yes  Who was at the meeting?  pt  Patient/Family wishes: Interventions discontinued/not started   Mechanical Ventilation      Time In: 1130 Time Out: 1240 Time Total: 70 min Greater than 50%  of this time was spent counseling and coordinating care related to the above assessment and plan.  Signed by: Irean Hong, NP   Please contact Palliative Medicine Team phone at [phone] for questions and concerns.  For individual provider: See Loretha Stapler

## 2018-05-04 NOTE — Progress Notes (Signed)
Nutrition Brief Note  Nutrition screen performed as part of COPD gold order set.   Wt Readings from Last 15 Encounters:  04/15/18 81.3 kg  04/11/18 83.2 kg  03/20/18 83.2 kg  12/21/17 79.6 kg  12/12/17 81.1 kg  12/05/17 83.1 kg  09/17/17 82.4 kg  09/10/17 82 kg  08/08/17 83.5 kg  08/01/17 81.7 kg  12/25/16 79.9 kg  12/20/16 79.8 kg  11/20/16 78.5 kg  11/15/16 80.6 kg  10/31/16 80.4 kg   Body mass index is 28.07 kg/m. Patient meets criteria for overweight based on current BMI.   Despite his advanced COPD, the pt reports he has not any troubles with his appetite. Normally, he eats 3x a day and avoids high salt items. He says he follows a "no salt" diet. He takes vitamins.   He denies any unintentional weight changes and says his UBW is 182 lbs with minor fluctuations d/t fluid shifts. Per chart, his weight has been 175-185 for >1 year.   At this time, pt reports a good appetite. He declines any supplements. No nutrition interventions warranted at this time. If nutrition issues arise, please consult RD.   Christophe Louis RD, LDN, CNSC Clinical Nutrition Available Tues-Sat via Pager: [phone] 05/04/2018 11:19 AM

## 2018-05-05 DIAGNOSIS — R0600 Dyspnea, unspecified: Secondary | ICD-10-CM | POA: Diagnosis not present

## 2018-05-05 DIAGNOSIS — J432 Centrilobular emphysema: Secondary | ICD-10-CM | POA: Diagnosis not present

## 2018-05-05 DIAGNOSIS — N183 Chronic kidney disease, stage 3 (moderate): Secondary | ICD-10-CM | POA: Diagnosis not present

## 2018-05-05 DIAGNOSIS — E1121 Type 2 diabetes mellitus with diabetic nephropathy: Secondary | ICD-10-CM | POA: Diagnosis not present

## 2018-05-05 DIAGNOSIS — J189 Pneumonia, unspecified organism: Secondary | ICD-10-CM | POA: Diagnosis not present

## 2018-05-05 LAB — CBC
HCT: 30.1 % — ABNORMAL LOW (ref 39.0–52.0)
Hemoglobin: 9.6 g/dL — ABNORMAL LOW (ref 13.0–17.0)
MCH: 30 pg (ref 26.0–34.0)
MCHC: 31.9 g/dL (ref 30.0–36.0)
MCV: 94.1 fL (ref 80.0–100.0)
Platelets: 294 10*3/uL (ref 150–400)
RBC: 3.2 MIL/uL — ABNORMAL LOW (ref 4.22–5.81)
RDW: 12.4 % (ref 11.5–15.5)
WBC: 11.3 10*3/uL — ABNORMAL HIGH (ref 4.0–10.5)
nRBC: 0 % (ref 0.0–0.2)

## 2018-05-05 LAB — GLUCOSE, CAPILLARY
GLUCOSE-CAPILLARY: 521 mg/dL — AB (ref 70–99)
Glucose-Capillary: 334 mg/dL — ABNORMAL HIGH (ref 70–99)
Glucose-Capillary: 326 mg/dL — ABNORMAL HIGH (ref 70–99)
Glucose-Capillary: 407 mg/dL — ABNORMAL HIGH (ref 70–99)

## 2018-05-05 LAB — BASIC METABOLIC PANEL
Anion gap: 13 (ref 5–15)
BUN: 49 mg/dL — ABNORMAL HIGH (ref 8–23)
CO2: 26 mmol/L (ref 22–32)
CREATININE: 2.17 mg/dL — AB (ref 0.61–1.24)
Calcium: 9.3 mg/dL (ref 8.9–10.3)
Chloride: 96 mmol/L — ABNORMAL LOW (ref 98–111)
GFR calc Af Amer: 30 mL/min — ABNORMAL LOW (ref >60)
GFR calc non Af Amer: 26 mL/min — ABNORMAL LOW (ref >60)
GLUCOSE: 371 mg/dL — AB (ref 70–99)
Potassium: 4.5 mmol/L (ref 3.5–5.1)
Sodium: 135 mmol/L (ref 135–145)

## 2018-05-05 MED ORDER — IPRATROPIUM-ALBUTEROL 0.5-2.5 (3) MG/3ML IN SOLN
3.0000 mL | Freq: Three times a day (TID) | RESPIRATORY_TRACT | Status: DC
  Administered 2018-05-05: 3 mL via RESPIRATORY_TRACT
  Filled 2018-05-05: qty 3

## 2018-05-05 MED ORDER — INSULIN ASPART 100 UNIT/ML ~~LOC~~ SOLN
10.0000 [IU] | Freq: Once | SUBCUTANEOUS | Status: AC
  Administered 2018-05-05: 10 [IU] via SUBCUTANEOUS

## 2018-05-05 MED ORDER — METOPROLOL SUCCINATE ER 100 MG PO TB24
100.0000 mg | ORAL_TABLET | Freq: Every day | ORAL | Status: DC
  Administered 2018-05-05: 100 mg via ORAL
  Filled 2018-05-05: qty 1

## 2018-05-05 MED ORDER — OXYMETAZOLINE HCL 0.05 % NA SOLN
1.0000 | Freq: Two times a day (BID) | NASAL | Status: DC
  Administered 2018-05-05 (×2): 1 via NASAL
  Filled 2018-05-05: qty 15

## 2018-05-05 MED ORDER — AMOXICILLIN-POT CLAVULANATE 500-125 MG PO TABS: 1.0000 | ORAL_TABLET | Freq: Two times a day (BID) | ORAL | 0 refills | Status: DC

## 2018-05-05 MED ORDER — PREDNISONE 20 MG PO TABS: 30.0000 mg | ORAL_TABLET | Freq: Every day | ORAL | Status: DC

## 2018-05-05 MED ORDER — AMOXICILLIN-POT CLAVULANATE 500-125 MG PO TABS
1.0000 | ORAL_TABLET | Freq: Two times a day (BID) | ORAL | Status: DC
  Administered 2018-05-05: 500 mg via ORAL
  Filled 2018-05-05: qty 1

## 2018-05-05 MED ORDER — GLIPIZIDE 10 MG PO TABS: 10.0000 mg | ORAL_TABLET | Freq: Two times a day (BID) | ORAL | 0 refills | Status: DC

## 2018-05-05 MED ORDER — PREDNISONE 10 MG PO TABS: ORAL_TABLET | ORAL | 0 refills | Status: DC

## 2018-05-05 MED ORDER — GLIPIZIDE 10 MG PO TABS
10.0000 mg | ORAL_TABLET | Freq: Two times a day (BID) | ORAL | Status: DC
  Filled 2018-05-05: qty 1

## 2018-05-05 NOTE — Progress Notes (Signed)
POCT CBG 521. msgd MD.

## 2018-05-05 NOTE — Plan of Care (Signed)

## 2018-05-05 NOTE — Progress Notes (Signed)
POCT CBG 334, messaged MD

## 2018-05-05 NOTE — Progress Notes (Signed)
[facility] Liaison: RN visit   Notified by Lawerance Sabal, [facility] of patient/family request for [facility] services at home after discharge. Chart and patient information under review by [facility] physician. Hospice eligibility pending at this time.   Writer spoke with patient at bedside to initiate education related to hospice philosophy, services and team approach to care. Mr. Gailes verbalized understanding of information given. Per discussion, plan is for discharge to home by private vehicle at a date not yet determined.   Please send signed and completed DNR form home with patient/family. Patient will need prescriptions for discharge comfort medications.   DME needs have been discussed, patient currently has the following equipment in the home: oxygen, walker, transfer chair.  Patient/family requests the following DME for delivery to the home: none at this time.    [facility] aware of the above. Please notify [facility] when patient is ready to leave the unit at discharge. (Call [phone] or [phone] after 5pm.) [facility] information and contact numbers given to Mr. Slutzky at time of visit. Above information shared with [facility].   Please call with any hospice related questions.   Thank you for this referral.   Elsie Saas, RN, [facility] Liaison [phone] ? Hospital liaisons are now on AMION.

## 2018-05-05 NOTE — Discharge Summary (Signed)
Physician Discharge Summary  Timothy Postinhomas O Maroney WJX:[id number] DOB: [birth year] DOA: 05/03/2018  PCP: Hannah Beatopland, Spencer, MD  Admit date: 05/03/2018 Discharge date: 05/05/2018  Admitted From: home Discharge disposition: home with hospice   Recommendations for Outpatient Follow-Up:   1. Patient is going home with hospice-- focus has shifted to comfort 2. Slow wean of steroids-- patient thinks he may benefit from low dose chronic steroids for breathing but his blood sugars have been an issue.  Increased his PO medication for now and patient to keep close checks and decrease back to home dose as he weans down the steroids (It appears most other medications may be cost prohibited -- he does not tolerate metformin) 3. ? Nebulizer and medications via hospice-- not sure how well he can do inhalers    Discharge Diagnosis:   Principal Problem:   Pneumonitis Active Problems:   DM (diabetes mellitus), type 2 with renal complications (HCC)   Centrilobular emphysema (HCC)   CKD (chronic kidney disease) stage 3, GFR 30-59 ml/min (HCC)   Sleep apnea   Dyspnea   COPD (chronic obstructive pulmonary disease) (HCC)   Acute exacerbation of chronic obstructive pulmonary disease (COPD) (HCC)   Goals of care, counseling/discussion   Palliative care by specialist    Discharge Condition: Improved.  Diet recommendation: Low sodium, heart healthy.  Carbohydrate-modified  Wound care: None.  Code status: Full.   History of Present Illness:   Timothy Cordova is a 85 y.o. male with medical history significant of severe COPD with 160-pack-year history [facility]  Was in the hospital approximately 3 weeks ago with similar symptoms.  Patient thought he had pneumonia but actually he was diagnosed with a COPD exacerbation.  Patient is now complaining of sharp pain in the left side of his chest when he takes a deep breath.  He has been more  dyspneic with exertion.  Family reports that when he gets up and walks around he does not use his oxygen.  He only uses his oxygen at night.  Family also reports that when he does walk around his sats dropped into the 70s.  Patient also complains of a chronic dry cough without sputum production.  The patient denies any anginal history.  Patient's wife and granddaughter feel that the patient would do better with 24/7 oxygen.  The patient is having a hard time realizing he needs to use it that way.  They also seem to need a great deal of more help at home.  We had an extended conversation about end-stage lung disease and hospice care.  They are all in favor of that.  At this point we will bring the patient in to ensure that he does not have a pneumonia.  A noncontrast CT scan of the chest has been ordered.  We will check that, talk with pulmonary and hopefully be able to discharge the patient in a couple of days.  ED course: Patient with cough but no fever no elevated white blood cell count chest x-ray shows a possible left-sided infiltrate.  Noncontrast CT scan of the chest has been ordered.   Hospital Course by Problem:   Pneumonitis: -feeling better -IV Abx changed to PO augmentin (recently on PO doxy)  Centrilobular emphysema/end-stage lung disease.  -encourage patient to wear O2 24/7 -duo nebs and albuterol nebs as well as an inhaled steroid.  -Dr. Willette PaSheehan consulted PCCM for medication optimization but  they have nothing to add -PO steroids-- slow taper -BNP elevated from prior-- s/p IV lasix x 1 - 1.1L off with some improvement but no resolution of symptoms  Diabetes mellitus type 2 with renal complications: - Continue home medications but at a higher dose until prednisone tapered down -while on steroids, it will continue to be uncontrolled  Chronic kidney disease stage III -Cr appears at baseline (1.9)  Sleep apnea:  -wears O2 at night  Goals of Care:  Patient and family  interested in transitioning to a more comfort based care model.  -palliative care consult apprecited--home with home hospice  -patient has an intolerance to morphine so will not yet prescribe SL morphine and instead defer to PCP    Medical Consultants:   PCCM Palliative care   Discharge Exam:   Vitals:   05/05/18 0739 05/05/18 0821  BP: 134/72   Pulse: (!) 115   Resp: 19   Temp: 98.2 F (36.8 C)   SpO2: 95% 97%   Vitals:   05/05/18 0423 05/05/18 0500 05/05/18 0739 05/05/18 0821  BP: (!) 124/59  134/72   Pulse: 63  (!) 115   Resp: 16  19   Temp: 97.8 F (36.6 C)  98.2 F (36.8 C)   TempSrc: Oral  Oral   SpO2: 97%  95% 97%  Weight:  78.7 kg    Height:        General exam: appears to be at baseline- on 2-3L of O2    The results of significant diagnostics from this hospitalization (including imaging, microbiology, ancillary and laboratory) are listed below for reference.     Procedures and Diagnostic Studies:   Dg Chest 2 View  Result Date: 05/03/2018 CLINICAL DATA:  Chest pain EXAM: CHEST - 2 VIEW COMPARISON:  04/10/2017, 11/30/2017 FINDINGS: Post sternotomy changes. Asymmetric ground-glass opacity in the left thorax. Mild cardiomegaly. No pleural effusion. No pneumothorax. IMPRESSION: 1. Asymmetric ground-glass opacity in the left thorax which may be secondary to asymmetric edema or interstitial inflammatory process 2. Mild cardiomegaly. Electronically Signed   By: Jasmine Pang M.D.   On: 05/03/2018 16:58   Ct Chest Wo Contrast  Result Date: 05/03/2018 CLINICAL DATA:  Cough and congestion. Pneumonia. Sharp pain in the chest. EXAM: CT CHEST WITHOUT CONTRAST TECHNIQUE: Multidetector CT imaging of the chest was performed following the standard protocol without IV contrast. COMPARISON:  04/10/2016 FINDINGS: Cardiovascular: Coronary, aortic arch, and branch vessel atherosclerotic vascular disease. Prior CABG. Stent in the left subclavian artery. Mediastinum/Nodes:  Unremarkable Lungs/Pleura: New small left pleural effusion. The previous small right pleural effusion has essentially cleared. Centrilobular emphysema. Improved aeration in reduced interstitial accentuation in the right lung compared to prior. However, there is increased interstitial accentuation in the left lung compared to prior. Bilateral airway thickening. There is some atelectasis in the left lower lobe. Upper Abdomen: A hyperdense 1.9 by 1.6 cm right mid kidney lesion posteriorly is present. Musculoskeletal: Thoracic spondylosis. Prior median sternotomy. Flowing syndesmophytes in the thoracic spine. IMPRESSION: 1. The interstitial accentuation in pleural effusion on the right side of mostly cleared, but now there is new interstitial accentuation and pleural effusion on the left side. This could be from dependent edema, or less likely atypical pneumonia. 2. Aortic Atherosclerosis (ICD10-I70.0) and Emphysema (ICD10-J43.9). Coronary atherosclerosis and prior CABG. 3. Airway thickening is present, suggesting bronchitis or reactive airways disease. 4. 1.9 by 1.6 cm right kidney complex lesion posteriorly. This is likely a hemorrhagic or proteinaceous cyst and measured 1.3 by 1.1  cm on 03/29/2012, although as best I can tell has not been characterized with a dedicated pre and postcontrast examination. Electronically Signed   By: Gaylyn RongWalter  Liebkemann M.D.   On: 05/03/2018 18:58     Labs:   Basic Metabolic Panel: Recent Labs  Lab 05/03/18 1613 05/03/18 1901 05/05/18 0346  NA 137  --  135  K 5.2*  --  4.5  CL 98  --  96*  CO2 28  --  26  GLUCOSE 267*  --  371*  BUN 25*  --  49*  CREATININE 1.63* 1.58* 2.17*  CALCIUM 9.2  --  9.3   GFR Estimated Creatinine Clearance: 22 mL/min (A) (by C-G formula based on SCr of 2.17 mg/dL (H)). Liver Function Tests: No results for input(s): AST, ALT, ALKPHOS, BILITOT, PROT, ALBUMIN in the last 168 hours. No results for input(s): LIPASE, AMYLASE in the last 168  hours. No results for input(s): AMMONIA in the last 168 hours. Coagulation profile No results for input(s): INR, PROTIME in the last 168 hours.  CBC: Recent Labs  Lab 05/03/18 1613 05/03/18 1901 05/05/18 0346  WBC 6.2 6.2 11.3*  HGB 10.0* 10.8* 9.6*  HCT 33.9* 36.2* 30.1*  MCV 98.5 98.9 94.1  PLT 235 210 294   Cardiac Enzymes: No results for input(s): CKTOTAL, CKMB, CKMBINDEX, TROPONINI in the last 168 hours. BNP: Invalid input(s): POCBNP CBG: Recent Labs  Lab 05/04/18 2148 05/04/18 2351 05/05/18 0608 05/05/18 0740 05/05/18 1141  GLUCAP 566* 521* 334* 326* 407*   D-Dimer No results for input(s): DDIMER in the last 72 hours. Hgb A1c Recent Labs    05/03/18 1901  HGBA1C 8.5*   Lipid Profile No results for input(s): CHOL, HDL, LDLCALC, TRIG, CHOLHDL, LDLDIRECT in the last 72 hours. Thyroid function studies No results for input(s): TSH, T4TOTAL, T3FREE, THYROIDAB in the last 72 hours.  Invalid input(s): FREET3 Anemia work up No results for input(s): VITAMINB12, FOLATE, FERRITIN, TIBC, IRON, RETICCTPCT in the last 72 hours. Microbiology Recent Results (from the past 240 hour(s))  Blood Culture (routine x 2)     Status: None (Preliminary result)   Collection Time: 05/03/18  5:40 PM  Result Value Ref Range Status   Specimen Description BLOOD LEFT FOREARM  Final   Special Requests   Final    BOTTLES DRAWN AEROBIC AND ANAEROBIC Blood Culture adequate volume   Culture   Final    NO GROWTH 2 DAYS Performed at [facility], [address] [place], [place] [address]    Report Status PENDING  Incomplete  Blood Culture (routine x 2)     Status: None (Preliminary result)   Collection Time: 05/03/18  5:45 PM  Result Value Ref Range Status   Specimen Description BLOOD RIGHT HAND  Final   Special Requests   Final    BOTTLES DRAWN AEROBIC AND ANAEROBIC Blood Culture adequate volume   Culture   Final    NO GROWTH 2 DAYS Performed at [facility], [address] [place], [place] [address]    Report Status PENDING  Incomplete     Discharge Instructions:   Discharge Instructions    Diet - low sodium heart healthy   Complete by:  As directed    Diet Carb Modified   Complete by:  As directed    Discharge instructions   Complete by:  As directed    While you are on the prednisone, your blood sugars will be higher than normal.  I have increased your glipizide (10mg  BID) to help with this but you will need to watch you blood sugars very closely to be sure they are not dropping too low.  As your dose of prednisone is lowered, we may need to return to your original dose of 5 mg BID-- please check you blood sugars in the AM and 1 other time during the day.  If your sugars are going below 200 on a regular basis or you have any blood sugars of <100 return to your prior dosing -wear O2 as needed (24/7)- adjust amounts -home hospice to follow to help manage your symptoms -discuss with your family doctor regarding benefit of low dose prednisone long-term   Increase activity slowly   Complete by:  As directed      Allergies as of 05/05/2018      Reactions   Metformin And Related Nausea And Vomiting, Rash   Tramadol Hcl Other (See Comments)   Tremor vs seizure activity   Codeine Nausea And Vomiting   Gabapentin Other (See Comments)   "Messed up my nervous system"   Morphine Sulfate Nausea And Vomiting   Other Nausea And Vomiting   "Narcotics, in general"   Ticlopidine Hcl Other (See Comments)   Reaction unknown by patient or family   Warfarin Sodium Nausea And Vomiting, Other (See Comments)   "Severe fatigue," also   Ace Inhibitors Rash, Cough   Tramadol Rash      Medication List    STOP taking these medications   diphenhydramine-acetaminophen 25-500 MG Tabs tablet Commonly known as:  TYLENOL PM     TAKE these medications   albuterol 108 (90 Base) MCG/ACT inhaler Commonly known as:  PROVENTIL HFA;VENTOLIN HFA Inhale 2 puffs into the  lungs every 6 (six) hours as needed for wheezing or shortness of breath.   amoxicillin-clavulanate 500-125 MG tablet Commonly known as:  AUGMENTIN Take 1 tablet (500 mg total) by mouth 2 (two) times daily.   aspirin EC 81 MG tablet Take 1 tablet (81 mg total) by mouth daily.   Carboxymethylcellulose Sod PF 0.25 % Soln Place 2 drops into both eyes 2 (two) times daily.   doxazosin 8 MG tablet Commonly known as:  CARDURA Take 4 mg by mouth at bedtime.   furosemide 20 MG tablet Commonly known as:  LASIX Take 1 tablet (20 mg total) by mouth daily as needed for fluid or edema.   glipiZIDE 10 MG tablet Commonly known as:  GLUCOTROL Take 1 tablet (10 mg total) by mouth 2 (two) times daily before a meal. What changed:    medication strength  how much to take   metoprolol 200 MG 24 hr tablet Commonly known as:  TOPROL-XL Take 0.5 tablets (100 mg total) by mouth daily.   ONE TOUCH ULTRA TEST test strip Generic drug:  glucose blood USE TO CHECK BLOOD SUGAR 2 TIMES DAILY AS DIRECTED   ONETOUCH DELICA LANCETS 33G Misc   predniSONE 10 MG tablet Commonly known as:  DELTASONE 30 mg x 4 day then 20 mg x 4 days then 10 mg x 4 days then 5 mg-- follow up with PCP to discuss benefit of continuing prednisone at a lower dose What changed:  additional instructions   PRESERVISION AREDS 2 Caps Take 1 capsule by mouth daily with breakfast.   CENTRUM SILVER 50+MEN PO Take 1 tablet by mouth daily.   rosuvastatin 40 MG tablet Commonly known as:  CRESTOR Take 1 tablet (40 mg total) by mouth  daily.   STIOLTO RESPIMAT 2.5-2.5 MCG/ACT Aers Generic drug:  Tiotropium Bromide-Olodaterol Inhale 2 puffs into the lungs daily.      Follow-up Information    Copland, Spencer, MD Follow up in 1 week(s).   Specialty:  Family Medicine Contact information: [address] [place] [address] [phone]            Time coordinating discharge: 25 min  Signed:  Joseph Art  DO  [facility] 05/05/2018, 1:37 PM

## 2018-05-05 NOTE — Care Management (Signed)
Spoke w [place] of [facility]. Patient can DC to home today if stable per MD into home hospice services. No DME needs. Will need DNR form signed and sent home w patient.

## 2018-05-05 NOTE — Progress Notes (Signed)
Patient and wife instructed on AVS- medications changes, where to pick up RX, follow up appointments.  No s/s of distress noted.  Patient has home oxygen for transport home.  All belongings packed at this time.  IV was removed and patient dressed.  Taken out to personal car by Clinical research associate with wife and family friend.

## 2018-05-06 ENCOUNTER — Telehealth: Payer: Self-pay

## 2018-05-06 NOTE — Telephone Encounter (Signed)
Left message for Timothy Cordova, with [facility], giving verbal orders to begin hospice services per Dr. Patsy Lager.   They will also be faxing over paperwork for Dr. Patsy Lager to complete and fax back.

## 2018-05-06 NOTE — Telephone Encounter (Signed)
errror

## 2018-05-06 NOTE — Telephone Encounter (Signed)
While I was at lunch Gavin Pound at [facility] said that she still needs verbal order that can begin hospice services for pt. When Dr Patsy Lager called and spoke with someone this morning that person was not a nurse and the order has to be given to a nurse.Please advise.

## 2018-05-06 NOTE — Telephone Encounter (Signed)
I spoke to them directly myself on the phone.

## 2018-05-06 NOTE — Telephone Encounter (Signed)
Timothy Cordova with [facility] calling to verify that Dr Timothy Cordova will be the attending for pt and that he will give VO to begin hospice services for pt. I advised Timothy Cordova that Dr Timothy Cordova had already spoken with [facility] this morning at 10 AM and she would need to call [facility] for orders that were previously given by Dr Timothy Cordova. Timothy Cordova said the computers have not merged yet between the 2 hospice groups and she needs a verbal order. I put Timothy Cordova on hold and called [facility] and spoke with Timothy Cordova. Timothy Cordova will call Timothy Cordova and give orders Dr Timothy Cordova has already given. Timothy Cordova voiced understanding and will wait on call from Timothy Cordova. Nothing further needed.

## 2018-05-06 NOTE — Telephone Encounter (Signed)
[facility] Night - Client Nonclinical Telephone Record [facility] Medical Call Center Client [facility] Night - Client Client Site [facility] - Night Physician Hannah Beat - MD Contact Type Call Call Type Home Care Hospice Page Now Who Is Calling Home Health / Hospice Agency Caller Name [facility] Facility Name [facility] Number [phone] Patient Name Timothy Cordova Patient DOB [birth year] Reason for Call Request to speak to Physician Initial Comment Caller Trina from [facility] states patient Timothy Cordova dob Aug 07, 1928 is going to hospice when he is released from [facility] Caller states she needs to know if Dr. Karleen Hampshire Copland will be attending. Additional Comment Paging DoctorName Phone DateTime Result/Outcome Message Type Notes Abbe Amsterdam MD [phone] 05/04/2018 4:40:35 PM Called On Call Provider - Reached Doctor Paged Abbe Amsterdam- MD 05/04/2018 4:40:42 PM Spoke with On Call - General Message Result Call Closed By: Ishmael Holter Transaction Date/Time: 05/04/2018 4:20:01 PM (ET)

## 2018-05-08 LAB — CULTURE, BLOOD (ROUTINE X 2)
Culture: NO GROWTH
Culture: NO GROWTH
Special Requests: ADEQUATE
Special Requests: ADEQUATE

## 2018-05-09 ENCOUNTER — Ambulatory Visit: Payer: Medicare PPO | Admitting: Family Medicine

## 2018-05-09 ENCOUNTER — Encounter: Payer: Self-pay | Admitting: Family Medicine

## 2018-05-09 VITALS — BP 122/58 | HR 73 | Temp 97.3°F | Ht 66.0 in | Wt 181.5 lb

## 2018-05-09 DIAGNOSIS — N178 Other acute kidney failure: Secondary | ICD-10-CM

## 2018-05-09 DIAGNOSIS — Z7189 Other specified counseling: Secondary | ICD-10-CM | POA: Diagnosis not present

## 2018-05-09 DIAGNOSIS — J449 Chronic obstructive pulmonary disease, unspecified: Secondary | ICD-10-CM

## 2018-05-09 DIAGNOSIS — E875 Hyperkalemia: Secondary | ICD-10-CM

## 2018-05-09 DIAGNOSIS — J9611 Chronic respiratory failure with hypoxia: Secondary | ICD-10-CM

## 2018-05-09 DIAGNOSIS — J441 Chronic obstructive pulmonary disease with (acute) exacerbation: Secondary | ICD-10-CM | POA: Diagnosis not present

## 2018-05-09 MED ORDER — IPRATROPIUM-ALBUTEROL 0.5-2.5 (3) MG/3ML IN SOLN: 3.0000 mL | Freq: Four times a day (QID) | RESPIRATORY_TRACT | 5 refills | Status: AC

## 2018-05-09 MED ORDER — PREDNISONE 10 MG PO TABS: 10.0000 mg | ORAL_TABLET | Freq: Every day | ORAL | 3 refills | Status: DC

## 2018-05-09 NOTE — Patient Instructions (Addendum)
I want you to wear your oxygen all the time.  Start using the nebulizer machine 4 times a day scheduled.  The medication that is used is called Duonebs.  It has albuterol and ipratroprium in it.  Stop using the Stiolto right now. Keep it and don't throw it away, but it is basically the same as the nebulizer medication.

## 2018-05-09 NOTE — Progress Notes (Signed)
Dr. Karleen HampshireSpencer T. Norely Schlick, MD, CAQ Sports [facility] [address] [place], [id number] Phone: [phone] Fax: [phone]  05/09/2018  Patient: Timothy Cordova, MRN: [id number], DOB: 01/16/1929, 85 y.o.  Primary Physician:  Hannah Beatopland, Jessy Calixte, MD   Chief Complaint  Patient presents with  . Hospitalization Follow-up   Subjective:   Timothy Cordova is a 85 y.o. very pleasant male patient who presents with the following:  [facility], Hosp f/u: He also had a recent hosp d/c 04/12/2018  Admit date: 05/03/2018 Discharge date: 05/05/2018  COPD exacerbation.  Globally he has not been doing well.  He has had 2 recent admissions and he has advanced COPD and chronic respiratory failure.  He is using his oxygen at home most of the time, but during the daytime he has been resistant to using it.  He was admitted with acute pneumonia and COPD exacerbation.  Duonebs at home 4 times a day Steroids oral Home 02  CAD s/p CABG  Acute renal failure.  On day of discharge, it appears he was sent home in acute renal failure, Cr increased from 1.5-1.6 baseline to 2.2, GFR 26.  Pneumonitis/ pneumonia. Discharged on Augmentin and feeling better.   On our f/u, found to have a K of 6.3  Spitting up some nasty stuff.  Was not doing well.  Had a lady doctor in the hospital.   Start 10 mg prednisone Duonebs.  [facility] - oxygen. Has it already set up.   Granddaughter. DNR has at home.  HCPOA - will bring back  Wife as HCPOA is with him for discussion.  Past Medical History, Surgical History, Social History, Family History, Problem List, Medications, and Allergies have been reviewed and updated if relevant.  Patient Active Problem List   Diagnosis Date Noted  . Advanced directives, counseling. 05/09/2018.  Palliative care following, he is NOT a HOSPICE patient. 05/09/2018    Priority: High  . CKD (chronic kidney disease) stage 3, GFR 30-59 ml/min (HCC)  08/18/2010    Priority: High  . DIASTOLIC HEART FAILURE, CHRONIC 08/18/2009    Priority: High  . CAD, AUTOLOGOUS BYPASS GRAFT 06/07/2008    Priority: High  . DM (diabetes mellitus), type 2 with renal complications (HCC) 09/04/2007    Priority: High  . Centrilobular emphysema (HCC) 08/08/2005    Priority: High  . Palliative care by specialist   . Atrial fibrillation and flutter (HCC)   . Pulmonary HTN (HCC) 07/30/2017  . Solitary pulmonary nodule 12/10/2015  . Aortic calcification, 04/20/2005 CT Chest 04/03/2013  . Sleep apnea   . Tobacco abuse (in remission)   . Chronic low back pain 04/18/2011  . OSTEOARTHRITIS 12/06/2009  . Mixed hyperlipidemia 06/11/2008  . ALLERGIC RHINITIS 01/30/2007  . HEARING LOSS 07/06/2006  . Essential hypertension 07/06/2006  . BPH (benign prostatic hyperplasia) 07/06/2006    Past Medical History:  Diagnosis Date  . Allergic rhinitis   . Benign prostatic hypertrophy   . CHF (congestive heart failure) (HCC)   . Chronic diastolic heart failure (HCC)    8/08 ECHO with EF 55%  . CKD (chronic kidney disease)   . COPD (chronic obstructive pulmonary disease) (HCC)    moderate. followed by Dr.Byrum  . Coronary artery disease    s/p CABG 2006. LHC (1/10): SVG-OM and PLOM, SVG-D, and LIMA-LAD were all patent  . Dementia (HCC)    (mild)--06/2006  . Diabetes mellitus   . Edema    localized. suspect  diastolic CHF + venous insufficiency  . Hearing loss    left >>right  . History of tobacco abuse   . Hypertension   . Mixed hyperlipidemia   . Osteoarthritis   . Pneumonia 07/31/2017  . Sleep apnea    no CPAP    Past Surgical History:  Procedure Laterality Date  . ANGIOPLASTY  [id number]  . CARDIAC [place]HETERIZATION  90s   [place]  . CARDIAC CATHETERIZATION     [facility];x2 stents  . CARDIAC CATHETERIZATION  04/29/2013  . CARDIOVERSION N/A 12/05/2017   Procedure: CARDIOVERSION;  Surgeon: Elease HashimotoNahser, Deloris PingPhilip J, MD;  [facility];  Service:  Cardiovascular;  Laterality: N/A;  . CORONARY ARTERY BYPASS GRAFT  02/2005  . ROTATOR CUFF REPAIR  2005   left, Gioffre  . TEE WITHOUT CARDIOVERSION N/A 12/05/2017   Procedure: TRANSESOPHAGEAL ECHOCARDIOGRAM (TEE);  Surgeon: Elease HashimotoNahser, Deloris PingPhilip J, MD;  [facility];  Service: Cardiovascular;  Laterality: N/A;    Social History   Socioeconomic History  . Marital status: Married    Spouse name: Thurston Holenne  . Number of children: Not on file  . Years of education: Not on file  . Highest education level: Not on file  Occupational History  . 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 Financial resource strain: Not on file  . Food insecurity:    Worry: Not on file    Inability: Not on file  . Transportation needs:    Medical: Not on file    Non-medical: Not on file  Tobacco Use  . Smoking status: Former Smoker    Packs/day: 3.00    Years: 60.00    Pack years: 180.00    Types: Cigarettes    Last attempt to quit: 07/12/2005    Years since quitting: 12.8  . Smokeless tobacco: Never Used  Substance and Sexual Activity  . Alcohol use: No    Alcohol/week: 0.0 standard drinks  . Drug use: No  . Sexual activity: Not on file  Lifestyle  . Physical activity:    Days per week: Not on file    Minutes per session: Not on file  . Stress: Not on file  Relationships  . Social connections:    Talks on phone: Not on file    Gets together: Not on file    Attends religious service: Not on file    Active member of club or organization: Not on file    Attends meetings of clubs or organizations: Not on file    Relationship status: Not on file  . Intimate partner violence:    Fear of current or ex partner: Not on file    Emotionally abused: Not on file    Physically abused: Not on file    Forced sexual activity: Not on file  Other Topics Concern  . Not on file  Social History Narrative   Married to wife Shane Crutchnne   Son is Orvilla Fusommy   Granddaughter is  active in care   Retired Radio broadcast assistantrubber fabricator      Advanced directives:   Discussed with Timothy Cordova and his wife Thurston Holenne who is HCPOA.  He would like to be made DNR / DNI and he has a [place] DNR/DNI form at his house.  He does not want shock, CPR, defibrillation, or any life support. They have been asked to bring in his HCPOA documentation [facility]. He does want active treatment of ongoing illness including  infection, COPD exacerbations, HF exacerbations, etc.  Electronically Signed  By: Hannah Beat, MD On: 05/09/2018 6:40 PM     Family History  Problem Relation Age of Onset  . Heart attack Mother   . Lung cancer Sister   . Prostate cancer Neg Hx   . Kidney cancer Neg Hx     Allergies  Allergen Reactions  . Metformin And Related Nausea And Vomiting and Rash  . Tramadol Hcl Other (See Comments)    Tremor vs seizure activity  . Codeine Nausea And Vomiting  . Gabapentin Other (See Comments)    "Messed up my nervous system"  . Morphine Sulfate Nausea And Vomiting  . Other Nausea And Vomiting    "Narcotics, in general"  . Ticlopidine Hcl Other (See Comments)    Reaction unknown by patient or family  . Warfarin Sodium Nausea And Vomiting and Other (See Comments)    "Severe fatigue," also  . Ace Inhibitors Rash and Cough  . Tramadol Rash    Medication list reviewed and updated in full in [facility].   GEN: No acute illnesses, no fevers, chills. GI: No n/v/d, eating normally Pulm: SOB and cough is improve No CP Interactive and getting along well at home. Better than when in the hospital. Otherwise, the pertinent positives and negatives are listed above and in the HPI, otherwise a full review of systems has been reviewed and is negative unless noted positive.   Objective:   BP (!) 122/58   Pulse 73   Temp (!) 97.3 F (36.3 C) (Oral)   Ht 5\' 6"  (1.676 m)   Wt 181 lb 8 oz (82.3 kg)   SpO2 91% Comment: Room Air  BMI 29.29 kg/m   GEN: WDWN, NAD, Non-toxic, A &  O x 3 HEENT: Atraumatic, Normocephalic. Neck supple. No masses, No LAD. TM clear, no bulging, no redness, normal COL Ears and Nose: No external deformity. As above CV: RRR, No M/G/R. No JVD. No thrill. No extra heart sounds. PULM: scattered wheezing and minimal rhonchi.  No retractions. No resp. distress. No accessory muscle use. ABD: S, NT, ND, + BS, No rebound, No HSM  EXTR: No c/c/e NEURO Normal gait. Clear in discussion. PSYCH: Normally interactive. Conversant. Not depressed or anxious appearing.  Calm demeanor.   Laboratory and Imaging Data: Results for orders placed or performed in visit on 05/09/18  Basic metabolic panel  Result Value Ref Range   Sodium 133 (L) 135 - 145 mEq/L   Potassium 6.3 No hemolysis seen (HH) 3.5 - 5.1 mEq/L   Chloride 96 96 - 112 mEq/L   CO2 32 19 - 32 mEq/L   Glucose, Bld 340 (H) 70 - 99 mg/dL   BUN 40 (H) 6 - 23 mg/dL   Creatinine, Ser 4.09 0.40 - 1.50 mg/dL   Calcium 9.4 8.4 - 81.1 mg/dL   GFR 91.47 (L) >82.95 mL/min     Assessment and Plan:   COPD with acute exacerbation (HCC)  Chronic respiratory failure with hypoxia (HCC) - Plan: predniSONE (DELTASONE) 10 MG tablet, ipratropium-albuterol (DUONEB) 0.5-2.5 (3) MG/3ML SOLN, DME Nebulizer machine  COPD, severe (HCC) - Plan: predniSONE (DELTASONE) 10 MG tablet, ipratropium-albuterol (DUONEB) 0.5-2.5 (3) MG/3ML SOLN, DME Nebulizer machine  Acute renal failure with other specified pathological lesion in kidney [facility]) - Plan: Basic metabolic panel  Advanced directives, counseling/discussion 05/09/2018  Hyperkalemia  Very complicated medical case.  2 recent hospitalizations.  Pneumonia/pneumonitis is improving on antibiotics.  On current prednisone taper.  With  risks and benefits and chronic respiratory failure, markedly impaired lung function, he and I have discussed risks and benefits, and I am in a place him on daily prednisone 10 mg.  We will also do a trial of some duo nebs as opposed to  inhalers to see if this improves his functionality.  Advanced directives and goals.  Long discussion with the patient and his wife.  Hospice consult was discussed with the patient in the hospital.  To be very clear, the patient declines hospice now.  He and his wife is okay with palliative care consultation and involvement, but they do not want to proceed with any hospice involvement.  He does want to have active medical care for all of his problems should they arise including infections, COPD exacerbations, or other basic medical care.  He does have a DNR/DNI at home, and does not want to be shocked or placed on artificial life support.  His wife is his healthcare power of attorney.  Both doing poorly.  At this point they have it set up so that they can move in with her granddaughter and her husband, and they have a extra room on the ground floor and are very open to having them move and should their health become even worse or if 1 of them dies.  Unfortunately, the patient's potassium came back at 6.3.  Given the risk of fatal arrhythmia and sudden death, we asked him to go to the ER.  Thankfully his EKG was normal and his potassium drifted down to 5 6.  Follow-up: Return in about 1 month (around 06/08/2018).  Meds ordered this encounter  Medications  . predniSONE (DELTASONE) 10 MG tablet    Sig: Take 1 tablet (10 mg total) by mouth daily with breakfast.    Dispense:  30 tablet    Refill:  3  . ipratropium-albuterol (DUONEB) 0.5-2.5 (3) MG/3ML SOLN    Sig: Take 3 mLs by nebulization 4 (four) times daily.    Dispense:  360 mL    Refill:  5   Orders Placed This Encounter  Procedures  . DME Nebulizer machine  . Basic metabolic panel    Signed,  Karleen Hampshire T. Oluwanifemi Petitti, MD   Outpatient Encounter Medications as of 05/09/2018  Medication Sig  . albuterol (PROVENTIL HFA;VENTOLIN HFA) 108 (90 Base) MCG/ACT inhaler Inhale 2 puffs into the lungs every 6 (six) hours as needed for wheezing or  shortness of breath.  [name] amoxicillin-clavulanate (AUGMENTIN) 500-125 MG tablet Take 1 tablet (500 mg total) by mouth 2 (two) times daily.  [name] aspirin EC 81 MG tablet Take 1 tablet (81 mg total) by mouth daily.  . Carboxymethylcellulose Sod PF 0.25 % SOLN Place 2 drops into both eyes 2 (two) times daily.   [name] doxazosin (CARDURA) 8 MG tablet Take 4 mg by mouth at bedtime.   . furosemide (LASIX) 20 MG tablet Take 1 tablet (20 mg total) by mouth daily as needed for fluid or edema.  [name] glipiZIDE (GLUCOTROL) 10 MG tablet Take 1 tablet (10 mg total) by mouth 2 (two) times daily before a meal.  . metoprolol (TOPROL-XL) 200 MG 24 hr tablet Take 0.5 tablets (100 mg total) by mouth daily.  . Multiple Vitamins-Minerals (CENTRUM SILVER 50+MEN PO) Take 1 tablet by mouth daily.  . Multiple Vitamins-Minerals (PRESERVISION AREDS 2) CAPS Take 1 capsule by mouth daily with breakfast.   . ONE TOUCH ULTRA TEST test strip USE TO CHECK BLOOD SUGAR 2 TIMES DAILY AS DIRECTED  .  predniSONE (DELTASONE) 10 MG tablet Take 1 tablet (10 mg total) by mouth daily with breakfast.  . rosuvastatin (CRESTOR) 40 MG tablet Take 1 tablet (40 mg total) by mouth daily.  . Tiotropium Bromide-Olodaterol (STIOLTO RESPIMAT) 2.5-2.5 MCG/ACT AERS Inhale 2 puffs into the lungs daily.   . [DISCONTINUED] ONETOUCH DELICA LANCETS 33G MISC   . [DISCONTINUED] predniSONE (DELTASONE) 10 MG tablet 30 mg x 4 day then 20 mg x 4 days then 10 mg x 4 days then 5 mg-- follow up with PCP to discuss benefit of continuing prednisone at a lower dose  . ipratropium-albuterol (DUONEB) 0.5-2.5 (3) MG/3ML SOLN Take 3 mLs by nebulization 4 (four) times daily.   No facility-administered encounter medications on file as of 05/09/2018.

## 2018-05-10 ENCOUNTER — Emergency Department
Admission: EM | Admit: 2018-05-10 | Discharge: 2018-05-10 | Disposition: A | Discharge status: Home / Self Care | Payer: Medicare PPO | Attending: Emergency Medicine | Admitting: Emergency Medicine

## 2018-05-10 ENCOUNTER — Other Ambulatory Visit: Payer: Self-pay

## 2018-05-10 ENCOUNTER — Encounter: Payer: Self-pay | Admitting: Intensive Care

## 2018-05-10 ENCOUNTER — Telehealth: Payer: Self-pay | Admitting: Family Medicine

## 2018-05-10 DIAGNOSIS — F0390 Unspecified dementia without behavioral disturbance: Secondary | ICD-10-CM | POA: Diagnosis not present

## 2018-05-10 DIAGNOSIS — Z7984 Long term (current) use of oral hypoglycemic drugs: Secondary | ICD-10-CM | POA: Diagnosis not present

## 2018-05-10 DIAGNOSIS — I272 Pulmonary hypertension, unspecified: Secondary | ICD-10-CM | POA: Diagnosis not present

## 2018-05-10 DIAGNOSIS — Z955 Presence of coronary angioplasty implant and graft: Secondary | ICD-10-CM | POA: Diagnosis not present

## 2018-05-10 DIAGNOSIS — Z7982 Long term (current) use of aspirin: Secondary | ICD-10-CM | POA: Insufficient documentation

## 2018-05-10 DIAGNOSIS — I4510 Unspecified right bundle-branch block: Secondary | ICD-10-CM | POA: Diagnosis not present

## 2018-05-10 DIAGNOSIS — Z79899 Other long term (current) drug therapy: Secondary | ICD-10-CM | POA: Insufficient documentation

## 2018-05-10 DIAGNOSIS — J9601 Acute respiratory failure with hypoxia: Secondary | ICD-10-CM | POA: Diagnosis not present

## 2018-05-10 DIAGNOSIS — I5032 Chronic diastolic (congestive) heart failure: Secondary | ICD-10-CM | POA: Diagnosis not present

## 2018-05-10 DIAGNOSIS — I2510 Atherosclerotic heart disease of native coronary artery without angina pectoris: Secondary | ICD-10-CM | POA: Insufficient documentation

## 2018-05-10 DIAGNOSIS — Z951 Presence of aortocoronary bypass graft: Secondary | ICD-10-CM | POA: Diagnosis not present

## 2018-05-10 DIAGNOSIS — G4730 Sleep apnea, unspecified: Secondary | ICD-10-CM | POA: Diagnosis not present

## 2018-05-10 DIAGNOSIS — Z87891 Personal history of nicotine dependence: Secondary | ICD-10-CM | POA: Insufficient documentation

## 2018-05-10 DIAGNOSIS — J449 Chronic obstructive pulmonary disease, unspecified: Secondary | ICD-10-CM | POA: Diagnosis not present

## 2018-05-10 DIAGNOSIS — N183 Chronic kidney disease, stage 3 (moderate): Secondary | ICD-10-CM | POA: Diagnosis not present

## 2018-05-10 DIAGNOSIS — I110 Hypertensive heart disease with heart failure: Secondary | ICD-10-CM | POA: Diagnosis not present

## 2018-05-10 DIAGNOSIS — E1122 Type 2 diabetes mellitus with diabetic chronic kidney disease: Secondary | ICD-10-CM | POA: Insufficient documentation

## 2018-05-10 DIAGNOSIS — J439 Emphysema, unspecified: Secondary | ICD-10-CM | POA: Diagnosis not present

## 2018-05-10 DIAGNOSIS — E875 Hyperkalemia: Secondary | ICD-10-CM | POA: Diagnosis not present

## 2018-05-10 DIAGNOSIS — J9610 Chronic respiratory failure, unspecified whether with hypoxia or hypercapnia: Secondary | ICD-10-CM | POA: Diagnosis not present

## 2018-05-10 DIAGNOSIS — J432 Centrilobular emphysema: Secondary | ICD-10-CM | POA: Diagnosis not present

## 2018-05-10 HISTORY — DX: Heart failure, unspecified: I50.9

## 2018-05-10 LAB — CBC WITH DIFFERENTIAL/PLATELET
Abs Immature Granulocytes: 1.09 10*3/uL — ABNORMAL HIGH (ref 0.00–0.07)
Basophils Absolute: 0.1 10*3/uL (ref 0.0–0.1)
Basophils Relative: 1 %
Eosinophils Absolute: 0 10*3/uL (ref 0.0–0.5)
Eosinophils Relative: 0 %
HCT: 34.8 % — ABNORMAL LOW (ref 39.0–52.0)
Hemoglobin: 10.8 g/dL — ABNORMAL LOW (ref 13.0–17.0)
IMMATURE GRANULOCYTES: 11 %
Lymphocytes Relative: 6 %
Lymphs Abs: 0.6 10*3/uL — ABNORMAL LOW (ref 0.7–4.0)
MCH: 29.7 pg (ref 26.0–34.0)
MCHC: 31 g/dL (ref 30.0–36.0)
MCV: 95.6 fL (ref 80.0–100.0)
Monocytes Absolute: 0.5 10*3/uL (ref 0.1–1.0)
Monocytes Relative: 5 %
Neutro Abs: 7.8 10*3/uL — ABNORMAL HIGH (ref 1.7–7.7)
Neutrophils Relative %: 77 %
Platelets: 424 10*3/uL — ABNORMAL HIGH (ref 150–400)
RBC: 3.64 MIL/uL — ABNORMAL LOW (ref 4.22–5.81)
RDW: 12.3 % (ref 11.5–15.5)
Smear Review: ADEQUATE
WBC: 10.1 10*3/uL (ref 4.0–10.5)
nRBC: 0 % (ref 0.0–0.2)

## 2018-05-10 LAB — BASIC METABOLIC PANEL
Anion gap: 6 (ref 5–15)
BUN: 40 mg/dL — AB (ref 6–23)
BUN: 40 mg/dL — ABNORMAL HIGH (ref 8–23)
CHLORIDE: 98 mmol/L (ref 98–111)
CO2: 32 mEq/L (ref 19–32)
CO2: 33 mmol/L — ABNORMAL HIGH (ref 22–32)
CREATININE: 1.54 mg/dL — AB (ref 0.61–1.24)
Calcium: 9.4 mg/dL (ref 8.4–10.5)
Calcium: 9.3 mg/dL (ref 8.9–10.3)
Chloride: 96 mEq/L (ref 96–112)
Creatinine, Ser: 1.46 mg/dL (ref 0.40–1.50)
GFR calc Af Amer: 46 mL/min — ABNORMAL LOW (ref >60)
GFR calc non Af Amer: 40 mL/min — ABNORMAL LOW (ref >60)
GFR: 45.56 mL/min — ABNORMAL LOW (ref >60.00)
Glucose, Bld: 340 mg/dL — ABNORMAL HIGH (ref 70–99)
Glucose, Bld: 360 mg/dL — ABNORMAL HIGH (ref 70–99)
Potassium: 6.3 mEq/L (ref 3.5–5.1)
Potassium: 5.6 mmol/L — ABNORMAL HIGH (ref 3.5–5.1)
Sodium: 133 mEq/L — ABNORMAL LOW (ref 135–145)
Sodium: 137 mmol/L (ref 135–145)

## 2018-05-10 LAB — MAGNESIUM: Magnesium: 2 mg/dL (ref 1.7–2.4)

## 2018-05-10 MED ORDER — PATIROMER SORBITEX CALCIUM 8.4 G PO PACK
8.4000 g | PACK | Freq: Once | ORAL | Status: AC
  Administered 2018-05-10: 8.4 g via ORAL
  Filled 2018-05-10: qty 1

## 2018-05-10 MED ORDER — PATIROMER SORBITEX CALCIUM 8.4 G PO PACK: 8.4000 g | PACK | Freq: Every day | ORAL | 0 refills | Status: AC

## 2018-05-10 NOTE — Discharge Instructions (Addendum)
Take the Veltassa once at home, also use your Lasix as prescribed tomorrow morning and then get your potassium rechecked the next day.  Take any vitamins or medications that have potassium in them.

## 2018-05-10 NOTE — ED Triage Notes (Signed)
Patient was called by Dr. Jenness Corner office and told to come to ER for high potassium. Denies CP. Patient wears 2L O2 continuously. HX COPD and reports he is always SOB.

## 2018-05-10 NOTE — Telephone Encounter (Signed)
Mrs Justice BritainHarrelson notified as instructed by telephone.  [facility].

## 2018-05-10 NOTE — Telephone Encounter (Signed)
[facility] called with critical results.  Potassium 6.3 Results given to Dr.Bedsole.

## 2018-05-10 NOTE — ED Provider Notes (Signed)
[facility] Emergency Department Provider Note  ____________________________________________   I have reviewed the triage vital signs and the nursing notes. Where available I have reviewed prior notes and, if possible and indicated, outside hospital notes.    HISTORY  Chief Complaint Abnormal Lab    HPI Timothy Cordova is a 85 y.o. male who was recently discharged from the hospital is in hospice, he has a history of renal insufficiency, baseline creatinine around 1.5, he does take Lasix but only "as needed" has not taken it recently.  Is eating and drinking.  He states he was in doing a very nice meal when he got a phone call from his physician.  They checked his blood work yesterday and found to have a potassium of 6.3 him he needed to go to emergently to the emergency room.  He has no complaints.  He has no fever no chills no nausea no vomiting diarrhea no chest pain or shortness of breath.  He is eating and drinking well.  Patient was just admitted to the hospital, he has a very strong preference to go home if at all possible.  He is in hospice.     Past Medical History:  Diagnosis Date  . Allergic rhinitis   . Benign prostatic hypertrophy   . CHF (congestive heart failure) (HCC)   . Chronic diastolic heart failure (HCC)    8/08 ECHO with EF 55%  . CKD (chronic kidney disease)   . COPD (chronic obstructive pulmonary disease) (HCC)    moderate. followed by Dr.Byrum  . Coronary artery disease    s/p CABG 2006. LHC (1/10): SVG-OM and PLOM, SVG-D, and LIMA-LAD were all patent  . Dementia (HCC)    (mild)--06/2006  . Diabetes mellitus   . Edema    localized. suspect diastolic CHF + venous insufficiency  . Hearing loss    left >>right  . History of tobacco abuse   . Hypertension   . Mixed hyperlipidemia   . Osteoarthritis   . Pneumonia 07/31/2017  . Sleep apnea    no CPAP    Patient Active Problem List   Diagnosis Date Noted  . Advanced  directives, counseling/discussion 05/09/2018 05/09/2018  . Palliative care by specialist   . Atrial fibrillation and flutter (HCC)   . Pulmonary HTN (HCC) 07/30/2017  . Solitary pulmonary nodule 12/10/2015  . Aortic calcification, 04/20/2005 CT Chest 04/03/2013  . Sleep apnea   . Tobacco abuse (in remission)   . Chronic low back pain 04/18/2011  . CKD (chronic kidney disease) stage 3, GFR 30-59 ml/min (HCC) 08/18/2010  . OSTEOARTHRITIS 12/06/2009  . DIASTOLIC HEART FAILURE, CHRONIC 08/18/2009  . Mixed hyperlipidemia 06/11/2008  . CAD, AUTOLOGOUS BYPASS GRAFT 06/07/2008  . DM (diabetes mellitus), type 2 with renal complications (HCC) 09/04/2007  . ALLERGIC RHINITIS 01/30/2007  . HEARING LOSS 07/06/2006  . Essential hypertension 07/06/2006  . BPH (benign prostatic hyperplasia) 07/06/2006  . Centrilobular emphysema (HCC) 08/08/2005    Past Surgical History:  Procedure Laterality Date  . ANGIOPLASTY  [id number]  . [place]  90s   [place]  . CARDIAC CATHETERIZATION     [facility];x2 stents  . CARDIAC CATHETERIZATION  04/29/2013  . CARDIOVERSION N/A 12/05/2017   Procedure: CARDIOVERSION;  Surgeon: Elease HashimotoNahser, Deloris PingPhilip J, MD;  [facility];  Service: Cardiovascular;  Laterality: N/A;  . CORONARY ARTERY BYPASS GRAFT  02/2005  . ROTATOR CUFF REPAIR  2005   left, Gioffre  . TEE WITHOUT CARDIOVERSION N/A 12/05/2017  Procedure: TRANSESOPHAGEAL ECHOCARDIOGRAM (TEE);  Surgeon: Elease Hashimoto Deloris Ping, MD;  Location: [facility] ENDOSCOPY;  Service: Cardiovascular;  Laterality: N/A;    Prior to Admission medications   Medication Sig Start Date End Date Taking? Authorizing Provider  albuterol (PROVENTIL HFA;VENTOLIN HFA) 108 (90 Base) MCG/ACT inhaler Inhale 2 puffs into the lungs every 6 (six) hours as needed for wheezing or shortness of breath. 04/12/18  Yes Rolly Salter, MD  amoxicillin-clavulanate (AUGMENTIN) 500-125 MG tablet Take 1 tablet (500 mg total) by mouth 2 (two) times  daily. 05/05/18  Yes Joseph Art, DO  aspirin EC 81 MG tablet Take 1 tablet (81 mg total) by mouth daily. 04/15/18  Yes Copland, Karleen Hampshire, MD  Carboxymethylcellulose Sod PF 0.25 % SOLN Place 2 drops into both eyes 2 (two) times daily.    Yes [provider]  doxazosin (CARDURA) 8 MG tablet Take 4 mg by mouth at bedtime.    Yes [provider]  furosemide (LASIX) 20 MG tablet Take 1 tablet (20 mg total) by mouth daily as needed for fluid or edema. 04/12/18  Yes Rolly Salter, MD  glipiZIDE (GLUCOTROL) 10 MG tablet Take 1 tablet (10 mg total) by mouth 2 (two) times daily before a meal. 05/05/18  Yes Vann, Jessica U, DO  ipratropium-albuterol (DUONEB) 0.5-2.5 (3) MG/3ML SOLN Take 3 mLs by nebulization 4 (four) times daily. 05/09/18  Yes Copland, Karleen Hampshire, MD  metoprolol (TOPROL-XL) 200 MG 24 hr tablet Take 0.5 tablets (100 mg total) by mouth daily. 04/15/18  Yes Copland, Karleen Hampshire, MD  Multiple Vitamins-Minerals (CENTRUM SILVER 50+MEN PO) Take 1 tablet by mouth daily.   Yes [provider]  Multiple Vitamins-Minerals (PRESERVISION AREDS 2) CAPS Take 1 capsule by mouth daily with breakfast.    Yes [provider]  ONE TOUCH ULTRA TEST test strip USE TO CHECK BLOOD SUGAR 2 TIMES DAILY AS DIRECTED 09/08/15  Yes Copland, Karleen Hampshire, MD  predniSONE (DELTASONE) 10 MG tablet Take 1 tablet (10 mg total) by mouth daily with breakfast. 05/09/18  Yes Copland, Karleen Hampshire, MD  rosuvastatin (CRESTOR) 40 MG tablet Take 1 tablet (40 mg total) by mouth daily. 09/17/17  Yes Copland, Karleen Hampshire, MD  Tiotropium Bromide-Olodaterol (STIOLTO RESPIMAT) 2.5-2.5 MCG/ACT AERS Inhale 2 puffs into the lungs daily.    Yes [provider]    Allergies Metformin and related; Tramadol hcl; Codeine; Gabapentin; Morphine sulfate; Other; Ticlopidine hcl; Warfarin sodium; Ace inhibitors; and Tramadol  Family History  Problem Relation Age of Onset  . Heart attack Mother   . Lung cancer Sister   . Prostate  cancer Neg Hx   . Kidney cancer Neg Hx     Social History Social History   Tobacco Use  . Smoking status: Former Smoker    Packs/day: 3.00    Years: 60.00    Pack years: 180.00    Types: Cigarettes    Last attempt to quit: 07/12/2005    Years since quitting: 12.8  . Smokeless tobacco: Never Used  Substance Use Topics  . Alcohol use: No    Alcohol/week: 0.0 standard drinks  . Drug use: No    Review of Systems Constitutional: No fever/chills Eyes: No visual changes. ENT: No sore throat. No stiff neck no neck pain Cardiovascular: Denies chest pain. Respiratory: Denies shortness of breath. Gastrointestinal:   no vomiting.  No diarrhea.  No constipation. Genitourinary: Negative for dysuria. Musculoskeletal: Negative lower extremity swelling Skin: Negative for rash. Neurological: Negative for severe headaches, focal weakness or numbness.  ____________________________________________   PHYSICAL EXAM:  VITAL SIGNS: ED Triage Vitals  Enc Vitals Group     BP 05/10/18 1509 (!) 107/53     Pulse Rate 05/10/18 1509 74     Resp 05/10/18 1509 20     Temp 05/10/18 1509 97.7 F (36.5 C)     Temp Source 05/10/18 1509 Oral     SpO2 05/10/18 1509 95 %     Weight 05/10/18 1511 181 lb 8 oz (82.3 kg)     Height 05/10/18 1511 5\' 6"  (1.676 m)     Head Circumference --      Peak Flow --      Pain Score 05/10/18 1511 0     Pain Loc --      Pain Edu? --      Excl. in GC? --     Constitutional: Alert and oriented. Well appearing and in no acute distress. Eyes: Conjunctivae are normal Head: Atraumatic HEENT: No congestion/rhinnorhea. Mucous membranes are moist.  Oropharynx non-erythematous Neck:   Nontender with no meningismus, no masses, no stridor Cardiovascular: Normal rate, regular rhythm. Grossly normal heart sounds.  Good peripheral circulation. Respiratory: Normal respiratory effort.  No retractions. Lungs CTAB. Abdominal: Soft and nontender. No distention. No guarding no  rebound Back:  There is no focal tenderness or step off.  there is no midline tenderness there are no lesions noted. there is no CVA tenderness Musculoskeletal: No lower extremity tenderness, no upper extremity tenderness. No joint effusions, no DVT signs strong distal pulses no edema Neurologic:  Normal speech and language. No gross focal neurologic deficits are appreciated.  Skin:  Skin is warm, dry and intact. No rash noted. Psychiatric: Mood and affect are normal. Speech and behavior are normal.  ____________________________________________   LABS (all labs ordered are listed, but only abnormal results are displayed)  Labs Reviewed  CBC WITH DIFFERENTIAL/PLATELET - Abnormal; Notable for the following components:      Result Value   RBC 3.64 (*)    Hemoglobin 10.8 (*)    HCT 34.8 (*)    Platelets 424 (*)    Neutro Abs 7.8 (*)    Lymphs Abs 0.6 (*)    Abs Immature Granulocytes 1.09 (*)    All other components within normal limits  BASIC METABOLIC PANEL - Abnormal; Notable for the following components:   Potassium 5.6 (*)    CO2 33 (*)    Glucose, Bld 360 (*)    BUN 40 (*)    Creatinine, Ser 1.54 (*)    GFR calc non Af Amer 40 (*)    GFR calc Af Amer 46 (*)    All other components within normal limits  MAGNESIUM    Pertinent labs  results that were available during my care of the patient were reviewed by me and considered in my medical decision making (see chart for details). ____________________________________________  EKG  I personally interpreted any EKGs ordered by me or triage Sinus rhythm, IVCD, LAFB, RBBB, LVH, but no ST elevation or peak T waves ____________________________________________  RADIOLOGY  Pertinent labs & imaging results that were available during my care of the patient were reviewed by me and considered in my medical decision making (see chart for details). If possible, patient and/or family made aware of any abnormal findings.  No results  found. ____________________________________________    PROCEDURES  Procedure(s) performed: None  Procedures  Critical Care performed: None  ____________________________________________   INITIAL IMPRESSION / ASSESSMENT AND PLAN / ED COURSE  Pertinent labs & imaging results that were available during my care of the patient were reviewed by me and considered in my medical decision making (see chart for details).  Patient here with a mildly elevated potassium, it was elevated more yesterday, it is trending down on his own.  He has not taken Lasix recently.  He is not taking any potassium supplementation.  I had a long talk with the patient and the family about their goals.  The strong preference is to go home.  Given that his potassium is already trending down there are no EKG changes he is not short of breath, I will give him without test that here, and we will have him take another dose tomorrow, he will also take his Lasix, he will have his blood potassium rechecked immediately thereafter.  He is very comfortable with this plan.  The family understands the risks of this, we cannot monitor his potassium at home, however, they absolutely do not want to be admitted I do not think that is unreasonable.  We will discharge patient.   ____________________________________________   FINAL CLINICAL IMPRESSION(S) / ED DIAGNOSES  Final diagnoses:  None      This chart was dictated using voice recognition software.  Despite best efforts to proofread,  errors can occur which can change meaning.      Jeanmarie PlantMcShane, Nikeya Maxim A, MD 05/10/18 [phone]

## 2018-05-10 NOTE — Telephone Encounter (Signed)
Call pt his potassium is critically high.. he need to go to ER for emergent treatment and evaluation of heart with EKG.

## 2018-05-11 ENCOUNTER — Encounter: Payer: Self-pay | Admitting: Family Medicine

## 2018-05-16 ENCOUNTER — Telehealth: Payer: Self-pay | Admitting: *Deleted

## 2018-05-16 DIAGNOSIS — I5042 Chronic combined systolic (congestive) and diastolic (congestive) heart failure: Secondary | ICD-10-CM | POA: Diagnosis not present

## 2018-05-16 DIAGNOSIS — I5030 Unspecified diastolic (congestive) heart failure: Secondary | ICD-10-CM | POA: Diagnosis not present

## 2018-05-16 DIAGNOSIS — J449 Chronic obstructive pulmonary disease, unspecified: Secondary | ICD-10-CM | POA: Diagnosis not present

## 2018-05-16 NOTE — Telephone Encounter (Signed)
He was supposed to be on Eliquis 2.5 mg po bid, #60 montly, 11 refills Indication: Atrial Fibrillation  We tried many options and could not find a way for him to pay for it.  If he can through [place] that is great.

## 2018-05-16 NOTE — Telephone Encounter (Addendum)
Received fax from the [place] in [place] that states veteran wants to get Eliquis through the [place] due to cost.  Please fax a Rx for this and note to why this is prescribed.  I do not see Eliquis on his medication list.  Pleas advise.

## 2018-05-17 ENCOUNTER — Encounter: Payer: Self-pay | Admitting: *Deleted

## 2018-05-17 MED ORDER — APIXABAN 2.5 MG PO TABS: 2.5000 mg | ORAL_TABLET | Freq: Two times a day (BID) | ORAL | 11 refills | Status: DC

## 2018-05-17 NOTE — Telephone Encounter (Signed)
Eliquis prescription and office note from 12/12/2017 faxed to [facility]  at [phone].

## 2018-05-17 NOTE — Telephone Encounter (Signed)
This encounter was created in error - please disregard.

## 2018-05-22 ENCOUNTER — Other Ambulatory Visit: Payer: Self-pay | Admitting: *Deleted

## 2018-05-22 MED ORDER — DOXAZOSIN MESYLATE 8 MG PO TABS: 4.0000 mg | ORAL_TABLET | Freq: Every day | ORAL | 1 refills | Status: AC

## 2018-05-22 MED ORDER — METOPROLOL SUCCINATE ER 200 MG PO TB24: 100.0000 mg | ORAL_TABLET | Freq: Every day | ORAL | 1 refills | Status: AC

## 2018-06-02 NOTE — Progress Notes (Signed)
Dr. Karleen Hampshire T. Khanh Tanori, MD, CAQ Sports Medicine Primary Care and Sports Medicine [address] [place], [address] Phone: [phone] Fax: [phone]  06/06/2018  Patient: Timothy Cordova, MRN: [id number], DOB: Dec 31, 1928, 85 y.o.  Primary Physician:  Hannah Beat, MD   Chief Complaint  Patient presents with  . One month follow up   Subjective:   Timothy Cordova is a 85 y.o. very pleasant male patient who presents with the following:  Patient with end-stage COPD, 85 yo with multiple recent hospitalizations.  We changed him to Duonebs QID, continous 02, daily 10 mg of prednisone.  Dr. Shelle Iron from Pulmonology at the [facility] and formerly from our group sent me a note with some suggestions.  It seems like [name] did not completely understand all of his recommendations.  Dr. Stann Mainland had recommended that he restart his Stiolto, start Asmanex, and use duo nebs up to 4 times daily as needed.  He also recommended that he stop his prednisone.  Timothy Cordova is highly reluctant to do this, since he has been hospitalized multiple times recently and is doing overall fairly poorly.  He has been compliant with his oxygen.  Dr. glance also recommended getting a swallow study, and Timothy Cordova has had multiple cases of pneumonia over time including recently.  He does think that he is having some difficulty swallowing with food and liquids.  Does think it sometimes, almost like he is throwing up.  Swallow study 1/24 last hosp.  Just doing the asmanex duonebs  Past Medical History, Surgical History, Social History, Family History, Problem List, Medications, and Allergies have been reviewed and updated if relevant.  Patient Active Problem List   Diagnosis Date Noted  . Advanced directives, counseling. 05/09/2018.  Palliative care following, he is NOT a HOSPICE patient. 05/09/2018    Priority: High  . CKD (chronic kidney disease) stage 3, GFR 30-59 ml/min (HCC) 08/18/2010    Priority: High  . DIASTOLIC HEART  FAILURE, CHRONIC 08/18/2009    Priority: High  . Coronary artery disease involving autologous artery coronary bypass graft without angina pectoris 06/07/2008    Priority: High  . Type 2 diabetes mellitus with diabetic nephropathy, without long-term current use of insulin (HCC) 09/04/2007    Priority: High  . Centrilobular emphysema (HCC) 08/08/2005    Priority: High  . Palliative care by specialist   . Atrial fibrillation and flutter (HCC)   . Pulmonary HTN (HCC) 07/30/2017  . Solitary pulmonary nodule 12/10/2015  . Aortic calcification, 04/20/2005 CT Chest 04/03/2013  . Sleep apnea   . Tobacco abuse (in remission)   . Chronic low back pain 04/18/2011  . OSTEOARTHRITIS 12/06/2009  . Mixed hyperlipidemia 06/11/2008  . ALLERGIC RHINITIS 01/30/2007  . HEARING LOSS 07/06/2006  . Essential hypertension 07/06/2006  . BPH (benign prostatic hyperplasia) 07/06/2006    Past Medical History:  Diagnosis Date  . Allergic rhinitis   . Benign prostatic hypertrophy   . CHF (congestive heart failure) (HCC)   . Chronic diastolic heart failure (HCC)    8/08 ECHO with EF 55%  . CKD (chronic kidney disease)   . COPD (chronic obstructive pulmonary disease) (HCC)    moderate. followed by Dr.Byrum  . Coronary artery disease    s/p CABG 2006. LHC (1/10): SVG-OM and PLOM, SVG-D, and LIMA-LAD were all patent  . Dementia (HCC)    (mild)--06/2006  . Diabetes mellitus   . Edema    localized. suspect diastolic CHF + venous insufficiency  . Hearing loss  left >>right  . History of tobacco abuse   . Hypertension   . Mixed hyperlipidemia   . Osteoarthritis   . Pneumonia 07/31/2017  . Sleep apnea    no CPAP    Past Surgical History:  Procedure Laterality Date  . ANGIOPLASTY  [id number]  . CARDIAC CATHETERIZATION  90s   [place], [place] x1stent  . CARDIAC CATHETERIZATION     [facility];x2 stents  . CARDIAC CATHETERIZATION  04/29/2013  . CARDIOVERSION N/A 12/05/2017   Procedure: CARDIOVERSION;  Surgeon:  Elease Hashimoto Deloris Ping, MD;  Location: [facility] ENDOSCOPY;  Service: Cardiovascular;  Laterality: N/A;  . CORONARY ARTERY BYPASS GRAFT  02/2005  . ROTATOR CUFF REPAIR  2005   left, Gioffre  . TEE WITHOUT CARDIOVERSION N/A 12/05/2017   Procedure: TRANSESOPHAGEAL ECHOCARDIOGRAM (TEE);  Surgeon: Elease Hashimoto Deloris Ping, MD;  Location: [facility] ENDOSCOPY;  Service: Cardiovascular;  Laterality: N/A;    Social History   Socioeconomic History  . Marital status: Married    Spouse name: [name]  . Number of children: Not on file  . Years of education: Not on file  . Highest education level: Not on file  Occupational History  . Occupation: Research officer, trade union: RETIRED    Comment: RETIRED  Social Needs  . Financial resource strain: Not on file  . Food insecurity:    Worry: Not on file    Inability: Not on file  . Transportation needs:    Medical: Not on file    Non-medical: Not on file  Tobacco Use  . Smoking status: Former Smoker    Packs/day: 3.00    Years: 60.00    Pack years: 180.00    Types: Cigarettes    Last attempt to quit: 07/12/2005    Years since quitting: 12.9  . Smokeless tobacco: Never Used  Substance and Sexual Activity  . Alcohol use: No    Alcohol/week: 0.0 standard drinks  . Drug use: No  . Sexual activity: Not on file  Lifestyle  . Physical activity:    Days per week: Not on file    Minutes per session: Not on file  . Stress: Not on file  Relationships  . Social connections:    Talks on phone: Not on file    Gets together: Not on file    Attends religious service: Not on file    Active member of club or organization: Not on file    Attends meetings of clubs or organizations: Not on file    Relationship status: Not on file  . Intimate partner violence:    Fear of current or ex partner: Not on file    Emotionally abused: Not on file    Physically abused: Not on file    Forced sexual activity: Not on file  Other Topics Concern  . Not on file  Social History Narrative    Married to wife Shane Crutch is [place]   Granddaughter is active in care   Retired Radio broadcast assistant      Advanced directives:   Discussed with Timothy Cordova and his wife [name] who is HCPOA.  He would like to be made DNR / DNI and he has a [place] DNR/DNI form at his house.  He does not want shock, CPR, defibrillation, or any life support. They have been asked to bring in his HCPOA documentation to scan into [facility] chart. He does want active treatment of ongoing illness including infection, COPD exacerbations, HF exacerbations, etc.  Electronically Signed  By: Karleen Hampshire  Makailee Nudelman, MD On: 05/09/2018 6:40 PM     Family History  Problem Relation Age of Onset  . Heart attack Mother   . Lung cancer Sister   . Prostate cancer Neg Hx   . Kidney cancer Neg Hx     Allergies  Allergen Reactions  . Metformin And Related Nausea And Vomiting and Rash  . Tramadol Hcl Other (See Comments)    Tremor vs seizure activity  . Codeine Nausea And Vomiting  . Gabapentin Other (See Comments)    "Messed up my nervous system"  . Morphine Sulfate Nausea And Vomiting  . Other Nausea And Vomiting    "Narcotics, in general"  . Ticlopidine Hcl Other (See Comments)    Reaction unknown by patient or family  . Warfarin Sodium Nausea And Vomiting and Other (See Comments)    "Severe fatigue," also  . Ace Inhibitors Rash and Cough  . Tramadol Rash    Medication list reviewed and updated in full in [place] Link.   GEN: No acute illnesses, no fevers, chills. GI: No n/v/d, eating normally Pulm: intermittent SOB with minimal SOB Interactive and getting along well at home.  Otherwise, ROS is as per the HPI.  Objective:   BP (!) 118/58   Pulse 68   Temp 97.9 F (36.6 C) (Oral)   Ht  (1.676 m)   Wt 178 lb 4 oz (80.9 kg)   SpO2 97%   BMI 28.77 kg/m   GEN: WDWN, NAD, Non-toxic, A & O x 3 HEENT: Atraumatic, Normocephalic. Neck supple. No masses, No LAD. Ears and Nose: No external deformity. CV: RRR, No M/G/R. No  JVD. No thrill. No extra heart sounds. PULM: CTA B, no wheezes, crackles, rhonchi. No retractions. No resp. distress. No accessory muscle use. EXTR: No c/c/e NEURO Normal gait.  PSYCH: Normally interactive. Conversant. Not depressed or anxious appearing.  Calm demeanor.   Laboratory and Imaging Data:  Assessment and Plan:   Centrilobular emphysema (HCC)  Coronary artery disease involving autologous artery coronary bypass graft without angina pectoris  Type 2 diabetes mellitus with diabetic nephropathy, without long-term current use of insulin (HCC)  CKD (chronic kidney disease) stage 3, GFR 30-59 ml/min (HCC)  DIASTOLIC HEART FAILURE, CHRONIC  Atrial fibrillation and flutter (HCC)  Esophageal dysphagia - Plan: SLP modified barium swallow, DG OP Swallowing Func-Medicare/Speech Path  Aspiration into airway, subsequent encounter - Plan: SLP modified barium swallow, DG OP Swallowing Func-Medicare/Speech Path  I went over all of his pulmonary medicines with him in great detail.  I showed him pictures of all of his inhalers.  He should be restarting his Stiolto, continue Asmanex, and use duo nebs as much as he needs to up to 4 times a day.  He does think that the nebulizer gives him greater relief compared to albuterol inhaler.  He is highly reluctant to discontinue his prednisone daily, and I think this is somewhat understandable given his problems and recent multiple hospitalizations.  I am going to cut his dose down to 5 mg a day and recheck him in 1 month.  He is agreeable to this. Good compliance with oxygen.  He does sound he has at least of significant risk for aspiration.  I am going to check a swallow study and speech therapy evaluation for swallowing.  Patient Instructions  Go back on the Stiolto every morning  Asmanex (the twisting one) every evening.  You can use the nebulizer machine up to every 4 hours if you need  it.  Cut the prednisone to 1/2 a tablet once a day for  2 weeks, then I want you to stop it.   REFERRALS TO SPECIALISTS, SPECIAL TESTS (MRI, CT, ULTRASOUNDS)  MARION or  Anastasiya will help you. ASK CHECK-IN FOR HELP.  Specialist appointment times vary a great deal, based on their schedule / openings. -- Some specialists have very long wait times. (Example. Dermatology)       Follow-up: Return in about 1 month (around 07/05/2018).  Meds ordered this encounter  Medications  . mometasone (ASMANEX) 220 MCG/INH inhaler    Sig: Inhale 2 puffs into the lungs daily.    Dispense:  1 Inhaler    Refill:  0   Orders Placed This Encounter  Procedures  . DG OP Swallowing Func-Medicare/Speech Path  . SLP modified barium swallow    Signed,  Lizmarie Witters T. Jeanae Whitmill, MD   Outpatient Encounter Medications as of 06/06/2018  Medication Sig  . albuterol (PROVENTIL HFA;VENTOLIN HFA) 108 (90 Base) MCG/ACT inhaler Inhale 2 puffs into the lungs every 6 (six) hours as needed for wheezing or shortness of breath.  [name] apixaban (ELIQUIS) 2.5 MG TABS tablet Take 1 tablet (2.5 mg total) by mouth 2 (two) times daily.  [name] aspirin EC 81 MG tablet Take 1 tablet (81 mg total) by mouth daily.  . Carboxymethylcellulose Sod PF 0.25 % SOLN Place 2 drops into both eyes 2 (two) times daily.   [name] doxazosin (CARDURA) 8 MG tablet Take 0.5 tablets (4 mg total) by mouth at bedtime.  . furosemide (LASIX) 20 MG tablet Take 1 tablet (20 mg total) by mouth daily as needed for fluid or edema.  [name] glipiZIDE (GLUCOTROL) 10 MG tablet Take 1 tablet (10 mg total) by mouth 2 (two) times daily before a meal.  . ipratropium-albuterol (DUONEB) 0.5-2.5 (3) MG/3ML SOLN Take 3 mLs by nebulization 4 (four) times daily.  . metoprolol (TOPROL-XL) 200 MG 24 hr tablet Take 0.5 tablets (100 mg total) by mouth daily.  . Multiple Vitamins-Minerals (CENTRUM SILVER 50+MEN PO) Take 1 tablet by mouth daily.  . Multiple Vitamins-Minerals (PRESERVISION AREDS 2) CAPS Take 1 capsule by mouth daily with breakfast.   .  ONE TOUCH ULTRA TEST test strip USE TO CHECK BLOOD SUGAR 2 TIMES DAILY AS DIRECTED  . predniSONE (DELTASONE) 10 MG tablet Take 1 tablet (10 mg total) by mouth daily with breakfast.  . rosuvastatin (CRESTOR) 40 MG tablet Take 1 tablet (40 mg total) by mouth daily.  . Tiotropium Bromide-Olodaterol (STIOLTO RESPIMAT) 2.5-2.5 MCG/ACT AERS Inhale 2 puffs into the lungs daily.   . [DISCONTINUED] amoxicillin-clavulanate (AUGMENTIN) 500-125 MG tablet Take 1 tablet (500 mg total) by mouth 2 (two) times daily.  . mometasone (ASMANEX) 220 MCG/INH inhaler Inhale 2 puffs into the lungs daily.   No facility-administered encounter medications on file as of 06/06/2018.

## 2018-06-06 ENCOUNTER — Ambulatory Visit: Payer: Medicare PPO | Admitting: Family Medicine

## 2018-06-06 ENCOUNTER — Encounter: Payer: Self-pay | Admitting: Family Medicine

## 2018-06-06 VITALS — BP 118/58 | HR 68 | Temp 97.9°F | Ht 66.0 in | Wt 178.2 lb

## 2018-06-06 DIAGNOSIS — E1121 Type 2 diabetes mellitus with diabetic nephropathy: Secondary | ICD-10-CM

## 2018-06-06 DIAGNOSIS — T17908D Unspecified foreign body in respiratory tract, part unspecified causing other injury, subsequent encounter: Secondary | ICD-10-CM | POA: Diagnosis not present

## 2018-06-06 DIAGNOSIS — N183 Chronic kidney disease, stage 3 unspecified: Secondary | ICD-10-CM

## 2018-06-06 DIAGNOSIS — I25810 Atherosclerosis of coronary artery bypass graft(s) without angina pectoris: Secondary | ICD-10-CM

## 2018-06-06 DIAGNOSIS — I5032 Chronic diastolic (congestive) heart failure: Secondary | ICD-10-CM | POA: Diagnosis not present

## 2018-06-06 DIAGNOSIS — I4892 Unspecified atrial flutter: Secondary | ICD-10-CM | POA: Diagnosis not present

## 2018-06-06 DIAGNOSIS — J432 Centrilobular emphysema: Secondary | ICD-10-CM

## 2018-06-06 DIAGNOSIS — R1310 Dysphagia, unspecified: Secondary | ICD-10-CM | POA: Diagnosis not present

## 2018-06-06 DIAGNOSIS — I4891 Unspecified atrial fibrillation: Secondary | ICD-10-CM

## 2018-06-06 DIAGNOSIS — R1319 Other dysphagia: Secondary | ICD-10-CM

## 2018-06-06 MED ORDER — MOMETASONE FUROATE 220 MCG/INH IN AEPB: 2.0000 | INHALATION_SPRAY | Freq: Every day | RESPIRATORY_TRACT | 0 refills | Status: DC

## 2018-06-06 NOTE — Patient Instructions (Signed)
Go back on the Stiolto every morning  Asmanex (the twisting one) every evening.  You can use the nebulizer machine up to every 4 hours if you need it.  Cut the prednisone to 1/2 a tablet once a day for 2 weeks, then I want you to stop it.   REFERRALS TO SPECIALISTS, SPECIAL TESTS (MRI, CT, ULTRASOUNDS)  MARION or  Anastasiya will help you. ASK CHECK-IN FOR HELP.  Specialist appointment times vary a great deal, based on their schedule / openings. -- Some specialists have very long wait times. (Example. Dermatology)

## 2018-06-07 ENCOUNTER — Other Ambulatory Visit (HOSPITAL_COMMUNITY): Payer: Self-pay

## 2018-06-07 ENCOUNTER — Other Ambulatory Visit: Payer: Self-pay | Admitting: Family Medicine

## 2018-06-07 DIAGNOSIS — R1310 Dysphagia, unspecified: Secondary | ICD-10-CM

## 2018-06-08 DIAGNOSIS — G4730 Sleep apnea, unspecified: Secondary | ICD-10-CM | POA: Diagnosis not present

## 2018-06-08 DIAGNOSIS — J439 Emphysema, unspecified: Secondary | ICD-10-CM | POA: Diagnosis not present

## 2018-06-08 DIAGNOSIS — I272 Pulmonary hypertension, unspecified: Secondary | ICD-10-CM | POA: Diagnosis not present

## 2018-06-08 DIAGNOSIS — J9601 Acute respiratory failure with hypoxia: Secondary | ICD-10-CM | POA: Diagnosis not present

## 2018-06-08 DIAGNOSIS — J9610 Chronic respiratory failure, unspecified whether with hypoxia or hypercapnia: Secondary | ICD-10-CM | POA: Diagnosis not present

## 2018-06-08 DIAGNOSIS — J432 Centrilobular emphysema: Secondary | ICD-10-CM | POA: Diagnosis not present

## 2018-06-08 DIAGNOSIS — I5032 Chronic diastolic (congestive) heart failure: Secondary | ICD-10-CM | POA: Diagnosis not present

## 2018-06-08 DIAGNOSIS — J449 Chronic obstructive pulmonary disease, unspecified: Secondary | ICD-10-CM | POA: Diagnosis not present

## 2018-06-13 ENCOUNTER — Other Ambulatory Visit: Payer: Self-pay

## 2018-06-13 ENCOUNTER — Ambulatory Visit
Admission: RE | Admit: 2018-06-13 | Discharge: 2018-06-13 | Disposition: A | Discharge status: Home / Self Care | Payer: Medicare PPO | Source: Ambulatory Visit | Attending: Family Medicine | Admitting: Family Medicine

## 2018-06-13 ENCOUNTER — Other Ambulatory Visit: Payer: Self-pay | Admitting: *Deleted

## 2018-06-13 DIAGNOSIS — T17908D Unspecified foreign body in respiratory tract, part unspecified causing other injury, subsequent encounter: Secondary | ICD-10-CM | POA: Diagnosis not present

## 2018-06-13 DIAGNOSIS — R1319 Other dysphagia: Secondary | ICD-10-CM

## 2018-06-13 DIAGNOSIS — R1310 Dysphagia, unspecified: Secondary | ICD-10-CM | POA: Diagnosis not present

## 2018-06-13 DIAGNOSIS — R1313 Dysphagia, pharyngeal phase: Secondary | ICD-10-CM

## 2018-06-13 MED ORDER — GLIPIZIDE 10 MG PO TABS: 10.0000 mg | ORAL_TABLET | Freq: Two times a day (BID) | ORAL | 1 refills | Status: AC

## 2018-06-13 NOTE — Therapy (Addendum)
[facility] DIAGNOSTIC RADIOLOGY [address] [place], [place], [address] Phone: [phone]   Fax:     Modified Barium Swallow  Patient Details  Name: Timothy Cordova MRN: [id number] Date of Birth: 06-02-1928 No data recorded  Encounter Date: 06/13/2018  End of Session - 06/13/18 1711    Visit Number  1    Number of Visits  1    SLP Start Time  1045    SLP Stop Time   1210    SLP Time Calculation (min)  85 min    Activity Tolerance  Patient tolerated treatment well       Past Medical History:  Diagnosis Date  . Allergic rhinitis   . Benign prostatic hypertrophy   . CHF (congestive heart failure) (HCC)   . Chronic diastolic heart failure (HCC)    8/08 ECHO with EF 55%  . CKD (chronic kidney disease)   . COPD (chronic obstructive pulmonary disease) (HCC)    moderate. followed by Dr.Byrum  . Coronary artery disease    s/p CABG 2006. LHC (1/10): SVG-OM and PLOM, SVG-D, and LIMA-LAD were all patent  . Dementia (HCC)    (mild)--06/2006  . Diabetes mellitus   . Edema    localized. suspect diastolic CHF + venous insufficiency  . Hearing loss    left >>right  . History of tobacco abuse   . Hypertension   . Mixed hyperlipidemia   . Osteoarthritis   . Pneumonia 07/31/2017  . Sleep apnea    no CPAP    Past Surgical History:  Procedure Laterality Date  . ANGIOPLASTY  [id number]  . CARDIAC CATHETERIZATION  90s   [place], [place] x1stent  . CARDIAC CATHETERIZATION     [facility];x2 stents  . CARDIAC CATHETERIZATION  04/29/2013  . CARDIOVERSION N/A 12/05/2017   Procedure: CARDIOVERSION;  Surgeon: Elease Hashimoto Deloris Ping, MD;  Location: [facility] ENDOSCOPY;  Service: Cardiovascular;  Laterality: N/A;  . CORONARY ARTERY BYPASS GRAFT  02/2005  . ROTATOR CUFF REPAIR  2005   left, Gioffre  . TEE WITHOUT CARDIOVERSION N/A 12/05/2017   Procedure: TRANSESOPHAGEAL ECHOCARDIOGRAM (TEE);  Surgeon: Elease Hashimoto Deloris Ping, MD;  Location: [facility] ENDOSCOPY;  Service: Cardiovascular;   Laterality: N/A;    There were no vitals filed for this visit.   Subjective: Patient behavior: (alertness, ability to follow instructions, etc.): Pt was very pleasant and cooperative with swallow study. He completed all tasks easily and with accuracy.  Chief complaint: Recurrent pneumonia.  Dr. Shelle Iron also recommended getting a swallow study, as patient has had multiple cases of pneumonia over time including recently.  He does think that he is having some difficulty swallowing with food and liquids.  Does think it sometimes is almost like he is throwing up.    Objective:  Radiological Procedure: A videoflouroscopic evaluation of oral-preparatory, reflex initiation, and pharyngeal phases of the swallow was performed; as well as a screening of the upper esophageal phase.  I. POSTURE: Upright in flouro chair II. VIEW: Lateral  III. COMPENSATORY STRATEGIES: cough post swallow, small sip IV. BOLUSES ADMINISTERED:  Thin Liquid:2 sips, 1 tsp  Nectar-thick Liquid: 1 sip, 2 tsp   Honey-thick Liquid: 1 sip    Puree: 1 tsp  Mechanical Soft: 1 bite of cracker V. RESULTS OF EVALUATION: A. ORAL PREPARATORY PHASE: (The lips, tongue, and velum are observed for strength and coordination) WFL; Adequate oral prep & coordination for effective bolus propulsion. No oral residue noted. Adequate lip seal.       **  Overall Severity Rating: WFL  B. SWALLOW INITIATION/REFLEX: (The reflex is normal if "triggered" by the time the bolus reached the base of the tongue) Pt demonstrated mod to severe swallow delay with thin and nectar thick liquids by cup as well as thin liquids by tsp with the swallow triggering at the pyriforms. Swallow initiation was observed at the level of the valleculae for all other tested consistencies and volumes: tsp nectar thick, sip honey thick, puree and soft solid.   **Overall Severity Rating: Moderate swallow delay with thin & nectar thick liquids  C. PHARYNGEAL PHASE: (Pharyngeal  function is normal if the bolus shows rapid, smooth, and continuous transit through the pharynx and there is no pharyngeal residue after the swallow) Min to no pharyngeal residue noted with all consistencies tested. Adequate pharyngeal pressure observed. Mod swallow delay resulting in frank aspiration of thin liquids and larger volumes of nectar thick is most significant pharyngeal phase deficit.   **Overall Severity Rating: Mild to moderate  D. LARYNGEAL PENETRATION: (Material entering into the laryngeal inlet/vestibule but not aspirated) Pt demonstrated deep silent laryngeal penetration with sip thin liquid.  E. ASPIRATION: Silent aspiration of sip of thin (with 1/2 sips) &  tsp thin liquid during the swallow, which could not be cleared. Delayed coughing observed following aspiration with sip of nectar thick liquid, which largely appeared to clear. F. ESOPHAGEAL PHASE: (Screening of the upper esophagus) No abnormalities noted.  ASSESSMENT: This very pleasant 85 year old male presents with mild to moderate pharyngeal dysphagia. Oral phase WFL. Pharyngeal phase c/b mod swallow delay resulting in silent & frank aspiration of thin & nectar thick liquids by cup as well as smaller volumes of thin liquid by TSP during the swallow. Pharyngeal pressure largely WFL, min to no pharyngeal residue noted after the swallow with all consistencies tested. Swallow initiation was improved with smaller volumes of nectar thick liquid (TSP) triggering at the level of the valleculae and resulted in full laryngeal excursion and airway protection. Pt at high risk of aspiration with volumes of nectar thick liquid >1 TSP.  Recommend Regular diet with NECTAR thick liquids by TSP only, take meds whole in puree. SLP provided extensive education for pt & wife re: results of swallow study, diet recommendations, and safe swallow recommendations. Written materials provided to reinforce diet recommendations and references to locate nectar  thick liquids. MD may consider SLP referral for 1x visit to provide further education re: swallow function, safety, aspiration precautions, and safe swallow recommendations if pt feels this is needed to clarify results relayed today. PLAN/RECOMMENDATIONS:  A. Diet: Regular diet with Nectar thick liquids by TSP only  B. Swallowing Precautions: Meds to be taken whole in puree, sit fully upright when eating, eat slowly, limit distractions, No mixed consistencies. Nectar thick liquids to be consumed by TSP only.  C. Therapy recommendations: may consider referral for SLP tx to provide further education re: swallow function, safety and safe swallow recommendations.  D. Results and recommendations were discussed with pt, wife, and wife's caregiver                          Patient will benefit from skilled therapeutic intervention in order to improve the following deficits and impairments:   Dysphagia, pharyngeal phase  Esophageal dysphagia - Plan: DG OP Swallowing Func-Medicare/Speech Path, DG OP Swallowing Func-Medicare/Speech Path  Aspiration into airway, subsequent encounter - Plan: DG OP Swallowing Func-Medicare/Speech Path, DG OP Swallowing Func-Medicare/Speech Path  Problem List Patient Active Problem List   Diagnosis Date Noted  . Advanced directives, counseling. 05/09/2018.  Palliative care following, he is NOT a HOSPICE patient. 05/09/2018  . Palliative care by specialist   . Atrial fibrillation and flutter (HCC)   . Pulmonary HTN (HCC) 07/30/2017  . Solitary pulmonary nodule 12/10/2015  . Aortic calcification, 04/20/2005 CT Chest 04/03/2013  . Sleep apnea   . Tobacco abuse (in remission)   . Chronic low back pain 04/18/2011  . CKD (chronic kidney disease) stage 3, GFR 30-59 ml/min (HCC) 08/18/2010  . OSTEOARTHRITIS 12/06/2009  . DIASTOLIC HEART FAILURE, CHRONIC 08/18/2009  . Mixed hyperlipidemia 06/11/2008  . Coronary artery disease involving  autologous artery coronary bypass graft without angina pectoris 06/07/2008  . Type 2 diabetes mellitus with diabetic nephropathy, without long-term current use of insulin (HCC) 09/04/2007  . ALLERGIC RHINITIS 01/30/2007  . HEARING LOSS 07/06/2006  . Essential hypertension 07/06/2006  . BPH (benign prostatic hyperplasia) 07/06/2006  . Centrilobular emphysema (HCC) 08/08/2005    Valetta Mulroy, MA, CCC-SLP 06/13/2018, 5:14 PM  [facility] DIAGNOSTIC RADIOLOGY [address] [place], [place], [address] Phone: [phone]   Fax:     Name: Timothy Cordova MRN: [id number] Date of Birth: 1928-08-19

## 2018-06-14 DIAGNOSIS — J449 Chronic obstructive pulmonary disease, unspecified: Secondary | ICD-10-CM | POA: Diagnosis not present

## 2018-06-14 DIAGNOSIS — I5030 Unspecified diastolic (congestive) heart failure: Secondary | ICD-10-CM | POA: Diagnosis not present

## 2018-06-14 DIAGNOSIS — I5042 Chronic combined systolic (congestive) and diastolic (congestive) heart failure: Secondary | ICD-10-CM | POA: Diagnosis not present

## 2018-06-25 ENCOUNTER — Encounter (HOSPITAL_COMMUNITY): Payer: Medicare PPO

## 2018-06-25 ENCOUNTER — Ambulatory Visit (HOSPITAL_COMMUNITY): Payer: Medicare PPO

## 2018-06-25 ENCOUNTER — Ambulatory Visit: Payer: Medicare PPO | Admitting: Cardiovascular Disease

## 2018-06-28 ENCOUNTER — Telehealth: Payer: Self-pay | Admitting: Cardiovascular Disease

## 2018-06-28 NOTE — Telephone Encounter (Signed)
   Cardiac Questionnaire:    Since your last visit or hospitalization:    1. Have you been having new or worsening chest pain? No   2. Have you been having new or worsening shortness of breath? No 3. Have you been having new or worsening leg swelling, wt gain, or increase in abdominal girth (pants fitting more tightly)? No   4. Have you had any passing out spells? No    *A YES to any of these questions would result in the appointment being kept. *If all the answers to these questions are NO, we should indicate that given the current situation regarding the worldwide coronarvirus pandemic, at the recommendation of the CDC, we are looking to limit gatherings in our waiting area, and thus will reschedule their appointment beyond four weeks from today.    Spoke with patient and he states that the blood thinner was causing problems with his nervous system and he stopped it and wants to discuss another options. Reviewed risks, signs, and symptoms to monitor for that would require immediate evaluation. He was appreciative for the call.

## 2018-07-01 ENCOUNTER — Encounter (HOSPITAL_COMMUNITY): Payer: Self-pay

## 2018-07-01 ENCOUNTER — Inpatient Hospital Stay (HOSPITAL_COMMUNITY)
Admission: EM | Admit: 2018-07-01 | Discharge: 2018-07-03 | DRG: 190 | Disposition: A | Discharge status: Hospice | Payer: Medicare PPO | Attending: Internal Medicine | Admitting: Internal Medicine

## 2018-07-01 ENCOUNTER — Emergency Department (HOSPITAL_COMMUNITY): Payer: Medicare PPO

## 2018-07-01 DIAGNOSIS — I272 Pulmonary hypertension, unspecified: Secondary | ICD-10-CM | POA: Diagnosis not present

## 2018-07-01 DIAGNOSIS — I480 Paroxysmal atrial fibrillation: Secondary | ICD-10-CM | POA: Diagnosis present

## 2018-07-01 DIAGNOSIS — Z7952 Long term (current) use of systemic steroids: Secondary | ICD-10-CM

## 2018-07-01 DIAGNOSIS — Z87891 Personal history of nicotine dependence: Secondary | ICD-10-CM

## 2018-07-01 DIAGNOSIS — R627 Adult failure to thrive: Secondary | ICD-10-CM | POA: Diagnosis present

## 2018-07-01 DIAGNOSIS — N183 Chronic kidney disease, stage 3 unspecified: Secondary | ICD-10-CM | POA: Diagnosis present

## 2018-07-01 DIAGNOSIS — J440 Chronic obstructive pulmonary disease with acute lower respiratory infection: Secondary | ICD-10-CM | POA: Diagnosis not present

## 2018-07-01 DIAGNOSIS — G4733 Obstructive sleep apnea (adult) (pediatric): Secondary | ICD-10-CM | POA: Diagnosis present

## 2018-07-01 DIAGNOSIS — Z515 Encounter for palliative care: Secondary | ICD-10-CM | POA: Diagnosis not present

## 2018-07-01 DIAGNOSIS — R531 Weakness: Secondary | ICD-10-CM | POA: Diagnosis not present

## 2018-07-01 DIAGNOSIS — R319 Hematuria, unspecified: Secondary | ICD-10-CM | POA: Diagnosis present

## 2018-07-01 DIAGNOSIS — Z7189 Other specified counseling: Secondary | ICD-10-CM

## 2018-07-01 DIAGNOSIS — Z91128 Patient's intentional underdosing of medication regimen for other reason: Secondary | ICD-10-CM

## 2018-07-01 DIAGNOSIS — J432 Centrilobular emphysema: Secondary | ICD-10-CM

## 2018-07-01 DIAGNOSIS — I4510 Unspecified right bundle-branch block: Secondary | ICD-10-CM | POA: Diagnosis not present

## 2018-07-01 DIAGNOSIS — M1990 Unspecified osteoarthritis, unspecified site: Secondary | ICD-10-CM | POA: Diagnosis present

## 2018-07-01 DIAGNOSIS — Z66 Do not resuscitate: Secondary | ICD-10-CM | POA: Diagnosis present

## 2018-07-01 DIAGNOSIS — J205 Acute bronchitis due to respiratory syncytial virus: Secondary | ICD-10-CM | POA: Diagnosis present

## 2018-07-01 DIAGNOSIS — E875 Hyperkalemia: Secondary | ICD-10-CM | POA: Diagnosis present

## 2018-07-01 DIAGNOSIS — I25810 Atherosclerosis of coronary artery bypass graft(s) without angina pectoris: Secondary | ICD-10-CM | POA: Diagnosis not present

## 2018-07-01 DIAGNOSIS — N400 Enlarged prostate without lower urinary tract symptoms: Secondary | ICD-10-CM | POA: Diagnosis present

## 2018-07-01 DIAGNOSIS — B974 Respiratory syncytial virus as the cause of diseases classified elsewhere: Secondary | ICD-10-CM | POA: Diagnosis present

## 2018-07-01 DIAGNOSIS — E1121 Type 2 diabetes mellitus with diabetic nephropathy: Secondary | ICD-10-CM | POA: Diagnosis present

## 2018-07-01 DIAGNOSIS — Y92009 Unspecified place in unspecified non-institutional (private) residence as the place of occurrence of the external cause: Secondary | ICD-10-CM

## 2018-07-01 DIAGNOSIS — G8929 Other chronic pain: Secondary | ICD-10-CM | POA: Diagnosis present

## 2018-07-01 DIAGNOSIS — M5440 Lumbago with sciatica, unspecified side: Secondary | ICD-10-CM

## 2018-07-01 DIAGNOSIS — Z8701 Personal history of pneumonia (recurrent): Secondary | ICD-10-CM

## 2018-07-01 DIAGNOSIS — R0600 Dyspnea, unspecified: Secondary | ICD-10-CM | POA: Diagnosis not present

## 2018-07-01 DIAGNOSIS — E782 Mixed hyperlipidemia: Secondary | ICD-10-CM | POA: Diagnosis present

## 2018-07-01 DIAGNOSIS — I2510 Atherosclerotic heart disease of native coronary artery without angina pectoris: Secondary | ICD-10-CM | POA: Diagnosis present

## 2018-07-01 DIAGNOSIS — R296 Repeated falls: Secondary | ICD-10-CM | POA: Diagnosis present

## 2018-07-01 DIAGNOSIS — I5033 Acute on chronic diastolic (congestive) heart failure: Secondary | ICD-10-CM | POA: Diagnosis not present

## 2018-07-01 DIAGNOSIS — E1165 Type 2 diabetes mellitus with hyperglycemia: Secondary | ICD-10-CM | POA: Diagnosis present

## 2018-07-01 DIAGNOSIS — T501X6A Underdosing of loop [high-ceiling] diuretics, initial encounter: Secondary | ICD-10-CM | POA: Diagnosis present

## 2018-07-01 DIAGNOSIS — I4891 Unspecified atrial fibrillation: Secondary | ICD-10-CM | POA: Diagnosis not present

## 2018-07-01 DIAGNOSIS — I5032 Chronic diastolic (congestive) heart failure: Secondary | ICD-10-CM | POA: Diagnosis present

## 2018-07-01 DIAGNOSIS — I130 Hypertensive heart and chronic kidney disease with heart failure and stage 1 through stage 4 chronic kidney disease, or unspecified chronic kidney disease: Secondary | ICD-10-CM | POA: Diagnosis not present

## 2018-07-01 DIAGNOSIS — I10 Essential (primary) hypertension: Secondary | ICD-10-CM | POA: Diagnosis not present

## 2018-07-01 DIAGNOSIS — T45516A Underdosing of anticoagulants, initial encounter: Secondary | ICD-10-CM | POA: Diagnosis present

## 2018-07-01 DIAGNOSIS — J189 Pneumonia, unspecified organism: Secondary | ICD-10-CM | POA: Diagnosis not present

## 2018-07-01 DIAGNOSIS — F0390 Unspecified dementia without behavioral disturbance: Secondary | ICD-10-CM | POA: Diagnosis present

## 2018-07-01 DIAGNOSIS — J441 Chronic obstructive pulmonary disease with (acute) exacerbation: Secondary | ICD-10-CM | POA: Diagnosis present

## 2018-07-01 DIAGNOSIS — Z885 Allergy status to narcotic agent status: Secondary | ICD-10-CM

## 2018-07-01 DIAGNOSIS — J9621 Acute and chronic respiratory failure with hypoxia: Secondary | ICD-10-CM | POA: Diagnosis not present

## 2018-07-01 DIAGNOSIS — Z7982 Long term (current) use of aspirin: Secondary | ICD-10-CM

## 2018-07-01 DIAGNOSIS — Z9981 Dependence on supplemental oxygen: Secondary | ICD-10-CM

## 2018-07-01 DIAGNOSIS — Z955 Presence of coronary angioplasty implant and graft: Secondary | ICD-10-CM

## 2018-07-01 DIAGNOSIS — Z8249 Family history of ischemic heart disease and other diseases of the circulatory system: Secondary | ICD-10-CM

## 2018-07-01 DIAGNOSIS — M545 Low back pain: Secondary | ICD-10-CM | POA: Diagnosis present

## 2018-07-01 DIAGNOSIS — H9193 Unspecified hearing loss, bilateral: Secondary | ICD-10-CM | POA: Diagnosis present

## 2018-07-01 DIAGNOSIS — G4730 Sleep apnea, unspecified: Secondary | ICD-10-CM | POA: Diagnosis not present

## 2018-07-01 DIAGNOSIS — E1122 Type 2 diabetes mellitus with diabetic chronic kidney disease: Secondary | ICD-10-CM | POA: Diagnosis present

## 2018-07-01 DIAGNOSIS — Z720 Tobacco use: Secondary | ICD-10-CM | POA: Diagnosis present

## 2018-07-01 DIAGNOSIS — Z7901 Long term (current) use of anticoagulants: Secondary | ICD-10-CM

## 2018-07-01 DIAGNOSIS — R911 Solitary pulmonary nodule: Secondary | ICD-10-CM | POA: Diagnosis not present

## 2018-07-01 DIAGNOSIS — R0902 Hypoxemia: Secondary | ICD-10-CM | POA: Diagnosis not present

## 2018-07-01 DIAGNOSIS — R0602 Shortness of breath: Secondary | ICD-10-CM | POA: Diagnosis not present

## 2018-07-01 DIAGNOSIS — Z801 Family history of malignant neoplasm of trachea, bronchus and lung: Secondary | ICD-10-CM

## 2018-07-01 DIAGNOSIS — Z888 Allergy status to other drugs, medicaments and biological substances status: Secondary | ICD-10-CM

## 2018-07-01 DIAGNOSIS — R410 Disorientation, unspecified: Secondary | ICD-10-CM | POA: Diagnosis not present

## 2018-07-01 DIAGNOSIS — I4892 Unspecified atrial flutter: Secondary | ICD-10-CM | POA: Diagnosis not present

## 2018-07-01 DIAGNOSIS — Z79899 Other long term (current) drug therapy: Secondary | ICD-10-CM

## 2018-07-01 DIAGNOSIS — Z951 Presence of aortocoronary bypass graft: Secondary | ICD-10-CM

## 2018-07-01 DIAGNOSIS — R05 Cough: Secondary | ICD-10-CM | POA: Diagnosis not present

## 2018-07-01 DIAGNOSIS — I959 Hypotension, unspecified: Secondary | ICD-10-CM | POA: Diagnosis not present

## 2018-07-01 LAB — BASIC METABOLIC PANEL
ANION GAP: 13 (ref 5–15)
Anion gap: 9 (ref 5–15)
BUN: 30 mg/dL — ABNORMAL HIGH (ref 8–23)
BUN: 32 mg/dL — ABNORMAL HIGH (ref 8–23)
CO2: 30 mmol/L (ref 22–32)
CO2: 32 mmol/L (ref 22–32)
CREATININE: 1.77 mg/dL — AB (ref 0.61–1.24)
Calcium: 9.5 mg/dL (ref 8.9–10.3)
Calcium: 9.4 mg/dL (ref 8.9–10.3)
Chloride: 92 mmol/L — ABNORMAL LOW (ref 98–111)
Chloride: 95 mmol/L — ABNORMAL LOW (ref 98–111)
Creatinine, Ser: 1.65 mg/dL — ABNORMAL HIGH (ref 0.61–1.24)
GFR calc Af Amer: 42 mL/min — ABNORMAL LOW (ref >60)
GFR calc Af Amer: 39 mL/min — ABNORMAL LOW (ref >60)
GFR calc non Af Amer: 37 mL/min — ABNORMAL LOW (ref >60)
GFR calc non Af Amer: 34 mL/min — ABNORMAL LOW (ref >60)
Glucose, Bld: 424 mg/dL — ABNORMAL HIGH (ref 70–99)
Glucose, Bld: 351 mg/dL — ABNORMAL HIGH (ref 70–99)
Potassium: 5.4 mmol/L — ABNORMAL HIGH (ref 3.5–5.1)
Potassium: 5.4 mmol/L — ABNORMAL HIGH (ref 3.5–5.1)
Sodium: 135 mmol/L (ref 135–145)
Sodium: 136 mmol/L (ref 135–145)

## 2018-07-01 LAB — CBG MONITORING, ED: Glucose-Capillary: 276 mg/dL — ABNORMAL HIGH (ref 70–99)

## 2018-07-01 LAB — INFLUENZA PANEL BY PCR (TYPE A & B)
Influenza A By PCR: NEGATIVE
Influenza B By PCR: NEGATIVE

## 2018-07-01 LAB — MAGNESIUM: Magnesium: 2 mg/dL (ref 1.7–2.4)

## 2018-07-01 LAB — BRAIN NATRIURETIC PEPTIDE: B Natriuretic Peptide: 227 pg/mL — ABNORMAL HIGH (ref 0.0–100.0)

## 2018-07-01 LAB — HEMOGLOBIN A1C
Hgb A1c MFr Bld: 8.3 % — ABNORMAL HIGH (ref 4.8–5.6)
Mean Plasma Glucose: 191.51 mg/dL

## 2018-07-01 LAB — CBC
HCT: 34.8 % — ABNORMAL LOW (ref 39.0–52.0)
Hemoglobin: 10.5 g/dL — ABNORMAL LOW (ref 13.0–17.0)
MCH: 30.2 pg (ref 26.0–34.0)
MCHC: 30.2 g/dL (ref 30.0–36.0)
MCV: 100 fL (ref 80.0–100.0)
NRBC: 0 % (ref 0.0–0.2)
Platelets: 206 10*3/uL (ref 150–400)
RBC: 3.48 MIL/uL — ABNORMAL LOW (ref 4.22–5.81)
RDW: 13.6 % (ref 11.5–15.5)
WBC: 9.6 10*3/uL (ref 4.0–10.5)

## 2018-07-01 LAB — I-STAT TROPONIN, ED: Troponin i, poc: 0.02 ng/mL (ref 0.00–0.08)

## 2018-07-01 LAB — GLUCOSE, CAPILLARY: Glucose-Capillary: 314 mg/dL — ABNORMAL HIGH (ref 70–99)

## 2018-07-01 MED ORDER — AZITHROMYCIN 250 MG PO TABS
500.0000 mg | ORAL_TABLET | Freq: Once | ORAL | Status: AC
  Administered 2018-07-01: 500 mg via ORAL
  Filled 2018-07-01: qty 2

## 2018-07-01 MED ORDER — DEXTROSE-NACL 5-0.9 % IV SOLN
INTRAVENOUS | Status: DC
  Administered 2018-07-01: 22:00:00 via INTRAVENOUS

## 2018-07-01 MED ORDER — DOXAZOSIN MESYLATE 4 MG PO TABS
4.0000 mg | ORAL_TABLET | Freq: Every day | ORAL | Status: DC
  Administered 2018-07-02: 4 mg via ORAL
  Filled 2018-07-01 (×2): qty 1

## 2018-07-01 MED ORDER — VANCOMYCIN HCL 10 G IV SOLR
1500.0000 mg | Freq: Once | INTRAVENOUS | Status: AC
  Administered 2018-07-01: 1500 mg via INTRAVENOUS
  Filled 2018-07-01 (×2): qty 1500

## 2018-07-01 MED ORDER — METHYLPREDNISOLONE SODIUM SUCC 125 MG IJ SOLR
125.0000 mg | Freq: Once | INTRAMUSCULAR | Status: AC
  Administered 2018-07-01: 125 mg via INTRAVENOUS
  Filled 2018-07-01: qty 2

## 2018-07-01 MED ORDER — ACETAMINOPHEN 650 MG RE SUPP: 650.0000 mg | Freq: Four times a day (QID) | RECTAL | Status: DC | PRN

## 2018-07-01 MED ORDER — ALBUTEROL SULFATE HFA 108 (90 BASE) MCG/ACT IN AERS
4.0000 | INHALATION_SPRAY | Freq: Once | RESPIRATORY_TRACT | Status: AC
  Administered 2018-07-01: 4 via RESPIRATORY_TRACT
  Filled 2018-07-01: qty 6.7

## 2018-07-01 MED ORDER — ASPIRIN EC 81 MG PO TBEC: 81.0000 mg | DELAYED_RELEASE_TABLET | Freq: Every day | ORAL | Status: DC

## 2018-07-01 MED ORDER — PREDNISONE 20 MG PO TABS: 40.0000 mg | ORAL_TABLET | Freq: Once | ORAL | Status: DC

## 2018-07-01 MED ORDER — METHYLPREDNISOLONE SODIUM SUCC 125 MG IJ SOLR
60.0000 mg | INTRAMUSCULAR | Status: DC
  Administered 2018-07-01: 60 mg via INTRAVENOUS
  Filled 2018-07-01: qty 2

## 2018-07-01 MED ORDER — IBUPROFEN 400 MG PO TABS: 400.0000 mg | ORAL_TABLET | Freq: Four times a day (QID) | ORAL | Status: DC | PRN

## 2018-07-01 MED ORDER — INSULIN ASPART 100 UNIT/ML ~~LOC~~ SOLN
8.0000 [IU] | Freq: Once | SUBCUTANEOUS | Status: AC
  Administered 2018-07-01: 8 [IU] via INTRAVENOUS

## 2018-07-01 MED ORDER — ACETAMINOPHEN 325 MG PO TABS: 650.0000 mg | ORAL_TABLET | Freq: Four times a day (QID) | ORAL | Status: DC | PRN

## 2018-07-01 MED ORDER — SODIUM CHLORIDE 0.9 % IV SOLN
2.0000 g | Freq: Once | INTRAVENOUS | Status: AC
  Administered 2018-07-01: 2 g via INTRAVENOUS
  Filled 2018-07-01: qty 2

## 2018-07-01 MED ORDER — INSULIN ASPART 100 UNIT/ML ~~LOC~~ SOLN
0.0000 [IU] | Freq: Every day | SUBCUTANEOUS | Status: DC
  Administered 2018-07-02: 4 [IU] via SUBCUTANEOUS

## 2018-07-01 MED ORDER — SODIUM CHLORIDE 0.9 % IV SOLN: 1.0000 g | INTRAVENOUS | Status: DC

## 2018-07-01 MED ORDER — ASPIRIN EC 325 MG PO TBEC
325.0000 mg | DELAYED_RELEASE_TABLET | Freq: Every day | ORAL | Status: DC
  Administered 2018-07-02 – 2018-07-03 (×2): 325 mg via ORAL
  Filled 2018-07-01 (×2): qty 1

## 2018-07-01 MED ORDER — IPRATROPIUM-ALBUTEROL 0.5-2.5 (3) MG/3ML IN SOLN
3.0000 mL | Freq: Four times a day (QID) | RESPIRATORY_TRACT | Status: DC
  Administered 2018-07-01 – 2018-07-02 (×5): 3 mL via RESPIRATORY_TRACT
  Filled 2018-07-01 (×4): qty 3

## 2018-07-01 MED ORDER — FUROSEMIDE 10 MG/ML IJ SOLN
20.0000 mg | Freq: Once | INTRAMUSCULAR | Status: AC
  Administered 2018-07-01: 20 mg via INTRAVENOUS
  Filled 2018-07-01: qty 2

## 2018-07-01 MED ORDER — METOPROLOL SUCCINATE ER 100 MG PO TB24
100.0000 mg | ORAL_TABLET | Freq: Every day | ORAL | Status: DC
  Administered 2018-07-02 – 2018-07-03 (×2): 100 mg via ORAL
  Filled 2018-07-01 (×2): qty 1

## 2018-07-01 MED ORDER — INSULIN ASPART 100 UNIT/ML ~~LOC~~ SOLN
0.0000 [IU] | Freq: Three times a day (TID) | SUBCUTANEOUS | Status: DC
  Administered 2018-07-02: 7 [IU] via SUBCUTANEOUS
  Administered 2018-07-02: 4 [IU] via SUBCUTANEOUS
  Administered 2018-07-02: 15 [IU] via SUBCUTANEOUS
  Administered 2018-07-03: 7 [IU] via SUBCUTANEOUS

## 2018-07-01 MED ORDER — VANCOMYCIN HCL 10 G IV SOLR: 1500.0000 mg | INTRAVENOUS | Status: DC

## 2018-07-01 NOTE — ED Notes (Signed)
Attempted report 

## 2018-07-01 NOTE — ED Notes (Signed)
Woods, MD at bedside

## 2018-07-01 NOTE — Progress Notes (Signed)
Labs from tonight available in epic for review, CBG 314, no insulin coverage. Schorr, NP notified

## 2018-07-01 NOTE — H&P (Signed)
[facility] History and Physical  Timothy Cordova UEA:[id number] DOB: [birth year] DOA: 07/01/2018  Referring physician:  PCP: Hannah Beat, MD   Chief Complaint: COPD exacerbation  HPI: Timothy JODOIN Sr. is a 85 y.o. WM PMHx dementia, chronic diastolic CHF s/p CABG, A. fib, CAD, CKD stage III, diabetes type 2 uncontrolled with complication, OSA (not on CPAP), tobacco abuse, end-stage COPD   presenting with weakness. He states he was in his usual state of health until Saturday when his legs gave out while he was washing his dishes and he fell on to his carpet floor. He denies any loss oc consciousness or trauma to the head. He states prior to this, he had occasional productive cough with clear sputum for the last 2-3 days as well as dyspnea on exertion. He states his strength came back after his nebulizer treatment and he stopped his Lasix and eliquis because he believed these meds could be contributory. He also states he took some of his 'left-over' prednisone from prior prescription with mild improvement in his symptoms. This morning he had another episode where his legs gave out and he fell on his chair so he came to the ED for evaluation. He states these symptoms feel similar to his prior COPD exacerbations. He has been using his home medications as prescribed.   Per chart review, he has had multiple recent hospitalizations for COPD exacerbation and is currently on Duonebs, Asmanex qhs, Stiolto daily for his COPD regimen and is on home oxygen 2L States understands has been diagnosed with aspiration does thicken his liquids most of the time.  Not on CPAP states was given diagnosis of OSA on 1 of his previous hospitalizations Patient believes his weakness secondary to starting Eliquis therefore self discontinued last Wednesday.  Has continued weakness with falling even though Eliquis discontinued.  States WILL NOT go back on blood thinner.    Review of Systems:   Constitutional:  No weight loss, night sweats, Fevers, chills, fatigue.  HEENT:  No headaches, positive difficulty swallowing,Tooth/dental problems,Sore throat,  No sneezing, itching, ear ache, nasal congestion, post nasal drip,  Cardio-vascular:  No chest pain, Orthopnea, PND, positive swelling in lower extremities, anasarca, dizziness, palpitations  GI:  No heartburn, indigestion, abdominal pain, nausea, vomiting, diarrhea, change in bowel habits, loss of appetite  Resp:  positive  shortness of breath with exertion or at rest. No excess mucus, no productive cough, positive  non-productive cough, No coughing up of blood.No change in color of mucus.No wheezing.No chest wall deformity  Skin:  no rash or lesions.  GU:  no dysuria, change in color of urine, no urgency or frequency. No flank pain.  Musculoskeletal:  No joint pain or swelling. No decreased range of motion. No back pain. positive joint weakness in bilateral hips and knees patient believes secondary to starting Eliquis Psych:  No change in mood or affect. No depression or anxiety. No memory loss.   Past Medical History:  Diagnosis Date  . Allergic rhinitis   . Benign prostatic hypertrophy   . CHF (congestive heart failure) (HCC)   . Chronic diastolic heart failure (HCC)    8/08 ECHO with EF 55%  . CKD (chronic kidney disease)   . COPD (chronic obstructive pulmonary disease) (HCC)    moderate. followed by Dr.Byrum  . Coronary artery disease    s/p CABG 2006. LHC (1/10): SVG-OM and PLOM, SVG-D, and LIMA-LAD were all patent  . Dementia (HCC)    (mild)--06/2006  . Diabetes mellitus   .  Edema    localized. suspect diastolic CHF + venous insufficiency  . Hearing loss    left >>right  . History of tobacco abuse   . Hypertension   . Mixed hyperlipidemia   . Osteoarthritis   . Pneumonia 07/31/2017  . Sleep apnea    no CPAP   Past Surgical History:  Procedure Laterality Date  . ANGIOPLASTY  [id number]  . CARDIAC  CATHETERIZATION  90s   [place], [place] x1stent  . CARDIAC CATHETERIZATION     [facility];x2 stents  . CARDIAC CATHETERIZATION  04/29/2013  . CARDIOVERSION N/A 12/05/2017   Procedure: CARDIOVERSION;  Surgeon: Elease Hashimoto Deloris Ping, MD;  Location: [facility] ENDOSCOPY;  Service: Cardiovascular;  Laterality: N/A;  . CORONARY ARTERY BYPASS GRAFT  02/2005  . ROTATOR CUFF REPAIR  2005   left, Gioffre  . TEE WITHOUT CARDIOVERSION N/A 12/05/2017   Procedure: TRANSESOPHAGEAL ECHOCARDIOGRAM (TEE);  Surgeon: Elease Hashimoto Deloris Ping, MD;  Location: [facility] ENDOSCOPY;  Service: Cardiovascular;  Laterality: N/A;   Social History:  reports that he quit smoking about 12 years ago. His smoking use included cigarettes. He has a 180.00 pack-year smoking history. He has never used smokeless tobacco. He reports that he does not drink alcohol or use drugs.  Allergies  Allergen Reactions  . Metformin And Related Nausea And Vomiting and Rash  . Tramadol Hcl Other (See Comments)    Tremor vs seizure activity  . Codeine Nausea And Vomiting  . Gabapentin Other (See Comments)    "Messed up my nervous system"  . Morphine Sulfate Nausea And Vomiting  . Other Nausea And Vomiting    "Narcotics, in general"  . Ticlopidine Hcl Other (See Comments)    Reaction unknown by patient or family  . Warfarin Sodium Nausea And Vomiting and Other (See Comments)    "Severe fatigue," also  . Ace Inhibitors Rash and Cough  . Tramadol Rash    Family History  Problem Relation Age of Onset  . Heart attack Mother   . Lung cancer Sister   . Prostate cancer Neg Hx   . Kidney cancer Neg Hx     Prior to Admission medications   Medication Sig Start Date End Date Taking? Authorizing Provider  albuterol (PROVENTIL HFA;VENTOLIN HFA) 108 (90 Base) MCG/ACT inhaler Inhale 2 puffs into the lungs every 6 (six) hours as needed for wheezing or shortness of breath. 04/12/18   Rolly Salter, MD  apixaban (ELIQUIS) 2.5 MG TABS tablet Take 1 tablet (2.5 mg total) by mouth 2 (two)  times daily. 05/17/18   Copland, Karleen Hampshire, MD  aspirin EC 81 MG tablet Take 1 tablet (81 mg total) by mouth daily. 04/15/18   Copland, Karleen Hampshire, MD  Carboxymethylcellulose Sod PF 0.25 % SOLN Place 2 drops into both eyes 2 (two) times daily.     [provider]  doxazosin (CARDURA) 8 MG tablet Take 0.5 tablets (4 mg total) by mouth at bedtime. 05/22/18   Copland, Karleen Hampshire, MD  furosemide (LASIX) 20 MG tablet Take 1 tablet (20 mg total) by mouth daily as needed for fluid or edema. 04/12/18   Rolly Salter, MD  glipiZIDE (GLUCOTROL) 10 MG tablet Take 1 tablet (10 mg total) by mouth 2 (two) times daily before a meal. 06/13/18   Copland, Karleen Hampshire, MD  ipratropium-albuterol (DUONEB) 0.5-2.5 (3) MG/3ML SOLN Take 3 mLs by nebulization 4 (four) times daily. 05/09/18   Copland, Karleen Hampshire, MD  metoprolol (TOPROL-XL) 200 MG 24 hr tablet Take 0.5 tablets (100 mg total) by mouth daily.  05/22/18   Copland, Karleen Hampshire, MD  mometasone [facility]) 220 MCG/INH inhaler Inhale 2 puffs into the lungs daily. 06/06/18   Copland, Karleen Hampshire, MD  Multiple Vitamins-Minerals (CENTRUM SILVER 50+MEN PO) Take 1 tablet by mouth daily.    [provider]  Multiple Vitamins-Minerals (PRESERVISION AREDS 2) CAPS Take 1 capsule by mouth daily with breakfast.     [provider]  ONE TOUCH ULTRA TEST test strip USE TO CHECK BLOOD SUGAR 2 TIMES DAILY AS DIRECTED 09/08/15   Copland, Karleen Hampshire, MD  predniSONE (DELTASONE) 10 MG tablet Take 1 tablet (10 mg total) by mouth daily with breakfast. 05/09/18   Copland, Karleen Hampshire, MD  rosuvastatin (CRESTOR) 40 MG tablet Take 1 tablet (40 mg total) by mouth daily. 09/17/17   Copland, Karleen Hampshire, MD  Tiotropium Bromide-Olodaterol (STIOLTO RESPIMAT) 2.5-2.5 MCG/ACT AERS Inhale 2 puffs into the lungs daily.     [provider]     Consultants:  None  Procedures/Significant Events:  None  I have personally reviewed and interpreted all radiology studies and my findings are as above.    VENTILATOR SETTINGS: None   Cultures 3/23 influenza panel pending  Antimicrobials: Anti-infectives (From admission, onward)   Start     Ordered Stop   07/01/18 1845  ceFEPIme (MAXIPIME) 2 g in sodium chloride 0.9 % 100 mL IVPB     07/01/18 1834     07/01/18 1845  vancomycin (VANCOCIN) 1,500 mg in sodium chloride 0.9 % 500 mL IVPB     07/01/18 1834     07/01/18 1645  azithromycin (ZITHROMAX) tablet 500 mg     07/01/18 1631 07/01/18 1639       Devices    LINES / TUBES:      Continuous Infusions:  Physical Exam: Vitals:   07/01/18 1545 07/01/18 1615 07/01/18 1630 07/01/18 1700  BP: (!) 145/84 (!) 141/51 (!) 144/54 (!) 110/93  Pulse: 84 82 81 80  Resp: (!) 30 (!) 29 (!) 28 (!) 31  Temp:      TempSrc:      SpO2: 100% 94% 96% 96%  Weight:      Height:        Wt Readings from Last 3 Encounters:  07/01/18 77.1 kg  06/06/18 80.9 kg  05/10/18 82.3 kg    General:positive acute on chronic respiratory distress Eyes: negative scleral hemorrhage, negative anisocoria, negative icterus ENT: Negative Runny nose, negative gingival bleeding, Neck:  Negative scars, masses, torticollis, lymphadenopathy, JVD Lungs: Diffuse poor air movement, positive expiratory wheeze, positive diffuse rhonchi.  Negative  crackles Cardiovascular: Regular rate and rhythm without murmur gallop or rub normal S1 and S2 Abdomen: Obese, negative abdominal pain, nondistended, positive soft, bowel sounds, no rebound, no ascites, no appreciable mass Extremities: No significant cyanosis, clubbing, positive bilateral lower extremities edema Rt>> left Skin: Negative rashes, lesions, ulcers Psychiatric:  Negative depression, negative anxiety, negative fatigue, negative mania  Central nervous system:  Cranial nerves II through XII intact, tongue/uvula midline, all extremities muscle strength 5/5, sensation intact throughout, negative dysarthria, negative expressive aphasia, negative receptive aphasia.         Labs on Admission:  Basic Metabolic Panel: Recent Labs  Lab 07/01/18 1320  NA 135  K 5.4*  CL 92*  CO2 30  GLUCOSE 424*  BUN 30*  CREATININE 1.65*  CALCIUM 9.5   Liver Function Tests: No results for input(s): AST, ALT, ALKPHOS, BILITOT, PROT, ALBUMIN in the last 168 hours. No results for input(s): LIPASE, AMYLASE in the last 168 hours.  No results for input(s): AMMONIA in the last 168 hours. CBC: Recent Labs  Lab 07/01/18 1320  WBC 9.6  HGB 10.5*  HCT 34.8*  MCV 100.0  PLT 206   Cardiac Enzymes: No results for input(s): CKTOTAL, CKMB, CKMBINDEX, TROPONINI in the last 168 hours.  BNP (last 3 results) Recent Labs    04/10/18 1047 05/03/18 1702 07/01/18 1320  BNP 129.0* 207.8* 227.0*    ProBNP (last 3 results) No results for input(s): PROBNP in the last 8760 hours.  CBG: No results for input(s): GLUCAP in the last 168 hours.  Radiological Exams on Admission: Dg Chest Portable 1 View  Result Date: 07/01/2018 CLINICAL DATA:  Shortness of breath. EXAM: PORTABLE CHEST 1 VIEW COMPARISON:  Radiographs of May 03, 2018. FINDINGS: The heart size and mediastinal contours are within normal limits. Both lungs are clear. No pneumothorax or pleural effusion is noted. Status post coronary bypass graft. The visualized skeletal structures are unremarkable. IMPRESSION: No active disease. Electronically Signed   By: Lupita Raider, M.D.   On: 07/01/2018 14:21    EKG: Independently reviewed.  RBBB, diffuse abnormal T wave    Assessment/Plan Active Problems:   Type 2 diabetes mellitus with diabetic nephropathy, without long-term current use of insulin (HCC)   Essential hypertension   Coronary artery disease involving autologous artery coronary bypass graft without angina pectoris   DIASTOLIC HEART FAILURE, CHRONIC   Centrilobular emphysema (HCC)   CKD (chronic kidney disease) stage 3, GFR 30-59 ml/min (HCC)   Chronic low back pain   Sleep apnea   Tobacco abuse (in  remission)   Solitary pulmonary nodule   Pulmonary HTN (HCC)   Atrial fibrillation and flutter (HCC)   Advanced directives, counseling. 05/09/2018.  Palliative care following, he is NOT a HOSPICE patient.   COPD exacerbation/acute on chronic respiratory failure with hypoxia - Treat for HCAP - DuoNeb every 6 -Solu-Medrol 60 mg daily  Solitary pulmonary nodule - PCXR unable to appreciate nodule, not mentioned by radiologist.  If patient continues to have episodes of increased O2 demand consider chest CT.  Tobacco abuse -Per patient currently not smoking  OSA - Patient insists that he does not have diagnosis of OSA therefore will not start patient on CPAP - RN and respiratory to monitor patient closely for episodes of apnea if witnessed episodes of apnea ensure appropriately charted.  Then start patient on CPAP per respiratory  Diastolic CHF/Pulmonary HTN - Unknown if CHF playing into patient's frequent visits to ED for acute on chronic respiratory failure will obtain echocardiogram - Daily weight - Strict in and out - Toprol XL 100 mg daily -Doxazosin 4 mg nightly - Hold Lasix 20 mg daily  A. fib - Patient had been placed on Eliquis, but convinced this is the cause of his inability to ambulate so states ABSOLUTELY REFUSES to restart.  Patient self DC'd last Wednesday.  Given patient's reported frequent falls anticoagulation would not be appropriate. - Patient agrees with/understands risks of not being on anticoagulation will start on full dose aspirin therapy.    CKD stage III disease (baseline Cr ~1.5) - Monitor closely  Diabetes type 2 uncontrolled with complication -Hemoglobin A1c pending, lipid panel pending. - 05/03/2018 hemoglobin A 1C= 8.5  - Resistant SSI  Failure to thrive/frequent falls -Placed social work consult.  Clear patient unable to care for himself at home secondary to multiple medical problems will need placement in SNF vs assisted living facility vs  long-term care  Code Status: DNR (DVT Prophylaxis: SCD Family Communication: None Disposition Plan: TBD   Data Reviewed: Care during the described time interval was provided by me .  I have reviewed this patient's available data, including medical history, events of note, physical examination, and all test results as part of my evaluation.   Time spent: 60 min  Naveen Lorusso, Roselind Messier [facility] Pager [phone]

## 2018-07-01 NOTE — ED Notes (Signed)
ED Provider at bedside. 

## 2018-07-01 NOTE — ED Triage Notes (Addendum)
Pt arrives from home via [facility], pt reports cough, subjective fevers, body aches and joint pain x1 week. Son at home has similar illness, neither have traveled. Pt wears 2L [place] at home, but states he still felt SOB. Pt has hx of chronic pneumonia and COPD. Pt on steroids for COPD, cbg 468. Pt had mechanical fall today, denies hitting head. Pt reports he was on thinners, but stopped them himself last week bc they made him weak. Pt denies being in contact with anyone with covid 19 that he knows of, no fever at hospital or with EMS.

## 2018-07-01 NOTE — ED Notes (Signed)
ED TO INPATIENT HANDOFF REPORT  ED Nurse Name and Phone #: [phone] Alvine Mostafa   S Name/Age/Gender Ermalene Postin Sr. 85 y.o. male Room/Bed: 047C/047C  Code Status   Code Status: DNR  Home/SNF/Other Home Patient oriented to: self, place, time and situation Is this baseline? Yes   Triage Complete: Triage complete  Chief Complaint Cough  Triage Note Pt arrives from home via [facility], pt reports cough, subjective fevers, body aches and joint pain x1 week. Son at home has similar illness, neither have traveled. Pt wears 2L [place] at home, but states he still felt SOB. Pt has hx of chronic pneumonia and COPD. Pt on steroids for COPD, cbg 468. Pt had mechanical fall today, denies hitting head. Pt reports he was on thinners, but stopped them himself last week bc they made him weak. Pt denies being in contact with anyone with covid 19 that he knows of, no fever at hospital or with EMS.   Allergies Allergies  Allergen Reactions  . Metformin And Related Nausea And Vomiting and Rash  . Tramadol Hcl Other (See Comments)    Tremor vs seizure activity  . Codeine Nausea And Vomiting  . Gabapentin Other (See Comments)    "Messed up my nervous system"  . Morphine Sulfate Nausea And Vomiting  . Other Nausea And Vomiting    "Narcotics, in general"  . Ticlopidine Hcl Other (See Comments)    Reaction unknown by patient or family  . Warfarin Sodium Nausea And Vomiting and Other (See Comments)    "Severe fatigue," also  . Ace Inhibitors Rash and Cough  . Tramadol Rash    Level of Care/Admitting Diagnosis ED Disposition    ED Disposition Condition Comment   Admit  [facility]ospital [facility] [[id number]]  Level of Care: Telemetry Medical [104]  Diagnosis: COPD exacerbation [facility]) [[id number]]  Admitting Physician: Drema Dallas [[id number]]  Attending Physician: Drema Dallas [[id number]]  Estimated length of stay: 3 - 4 days  Certification:: I certify this patient will need inpatient  services for at least 2 midnights  PT Class (Do Not Modify): Inpatient [101]  PT Acc Code (Do Not Modify): Private [1]       B Medical/Surgery History Past Medical History:  Diagnosis Date  . Allergic rhinitis   . Benign prostatic hypertrophy   . CHF (congestive heart failure) (HCC)   . Chronic diastolic heart failure (HCC)    8/08 ECHO with EF 55%  . CKD (chronic kidney disease)   . COPD (chronic obstructive pulmonary disease) (HCC)    moderate. followed by Dr.Byrum  . Coronary artery disease    s/p CABG 2006. LHC (1/10): SVG-OM and PLOM, SVG-D, and LIMA-LAD were all patent  . Dementia (HCC)    (mild)--06/2006  . Diabetes mellitus   . Edema    localized. suspect diastolic CHF + venous insufficiency  . Hearing loss    left >>right  . History of tobacco abuse   . Hypertension   . Mixed hyperlipidemia   . Osteoarthritis   . Pneumonia 07/31/2017  . Sleep apnea    no CPAP   Past Surgical History:  Procedure Laterality Date  . ANGIOPLASTY  [id number]  . CARDIAC CATHETERIZATION  90s   [place], [place] x1stent  . CARDIAC CATHETERIZATION     [facility];x2 stents  . CARDIAC CATHETERIZATION  04/29/2013  . CARDIOVERSION N/A 12/05/2017   Procedure: CARDIOVERSION;  Surgeon: Elease Hashimoto Deloris Ping, MD;  Location: [facility] ENDOSCOPY;  Service: Cardiovascular;  Laterality: N/A;  .  CORONARY ARTERY BYPASS GRAFT  02/2005  . ROTATOR CUFF REPAIR  2005   left, Gioffre  . TEE WITHOUT CARDIOVERSION N/A 12/05/2017   Procedure: TRANSESOPHAGEAL ECHOCARDIOGRAM (TEE);  Surgeon: Vesta Mixer, MD;  Location: [facility] ENDOSCOPY;  Service: Cardiovascular;  Laterality: N/A;     A IV Location/Drains/Wounds Patient Lines/Drains/Airways Status   Active Line/Drains/Airways    Name:   Placement date:   Placement time:   Site:   Days:   Peripheral IV 07/01/18 Right Antecubital   07/01/18    1318    Antecubital   less than 1   External Urinary Catheter   07/01/18    1503    -   less than 1          Intake/Output Last 24  hours  Intake/Output Summary (Last 24 hours) at 07/01/2018 1836 Last data filed at 07/01/2018 1458 Gross per 24 hour  Intake -  Output 150 ml  Net -150 ml    Labs/Imaging Results for orders placed or performed during the hospital encounter of 07/01/18 (from the past 48 hour(s))  CBC     Status: Abnormal   Collection Time: 07/01/18  1:20 PM  Result Value Ref Range   WBC 9.6 4.0 - 10.5 K/uL   RBC 3.48 (L) 4.22 - 5.81 MIL/uL   Hemoglobin 10.5 (L) 13.0 - 17.0 g/dL   HCT 62.2 (L) 63.3 - 35.4 %   MCV 100.0 80.0 - 100.0 fL   MCH 30.2 26.0 - 34.0 pg   MCHC 30.2 30.0 - 36.0 g/dL   RDW 56.2 56.3 - 89.3 %   Platelets 206 150 - 400 K/uL   nRBC 0.0 0.0 - 0.2 %    Comment: Performed at [facility], [address], [place], [place] [address]  Basic metabolic panel     Status: Abnormal   Collection Time: 07/01/18  1:20 PM  Result Value Ref Range   Sodium 135 135 - 145 mmol/L   Potassium 5.4 (H) 3.5 - 5.1 mmol/L   Chloride 92 (L) 98 - 111 mmol/L   CO2 30 22 - 32 mmol/L   Glucose, Bld 424 (H) 70 - 99 mg/dL   BUN 30 (H) 8 - 23 mg/dL   Creatinine, Ser 7.68 (H) 0.61 - 1.24 mg/dL   Calcium 9.5 8.9 - 11.5 mg/dL   GFR calc non Af Amer 37 (L) >60 mL/min   GFR calc Af Amer 42 (L) >60 mL/min   Anion gap 13 5 - 15    Comment: Performed at [facility], [address] [place], [place] [address]  Brain natriuretic peptide     Status: Abnormal   Collection Time: 07/01/18  1:20 PM  Result Value Ref Range   B Natriuretic Peptide 227.0 (H) 0.0 - 100.0 pg/mL    Comment: Performed at [facility], [address], [place], [place] [address]  I-Stat Troponin, ED (not at [facility])     Status: None   Collection Time: 07/01/18  1:34 PM  Result Value Ref Range   Troponin i, poc 0.02 0.00 - 0.08 ng/mL   Comment 3            Comment: Due to the release kinetics of cTnI, a negative result within the first hours of the onset of symptoms does not rule out myocardial infarction with certainty. If  myocardial infarction is still suspected, repeat the test at appropriate intervals.    Dg Chest Portable 1 View  Result Date: 07/01/2018 CLINICAL DATA:  Shortness of breath. EXAM: PORTABLE CHEST 1 VIEW COMPARISON:  Radiographs of May 03, 2018. FINDINGS: The heart size and mediastinal contours are within normal limits. Both lungs are clear. No pneumothorax or pleural effusion is noted. Status post coronary bypass graft. The visualized skeletal structures are unremarkable. IMPRESSION: No active disease. Electronically Signed   By: Lupita Raider, M.D.   On: 07/01/2018 14:21    Pending Labs Unresulted Labs (From admission, onward)    Start     Ordered   07/01/18 1823  Culture, sputum-assessment  ONCE - STAT,   R     07/01/18 1827   07/01/18 1823  Hemoglobin A1c  Once,   R     07/01/18 1827   07/01/18 1822  Basic metabolic panel  Once,   R     30/09/23 1827   07/01/18 1822  Magnesium  Once,   R     07/01/18 1827   07/01/18 1702  Influenza panel by PCR (type A & B)  (Influenza PCR Panel)  ONCE - STAT,   R     07/01/18 1701          Vitals/Pain Today's Vitals   07/01/18 1615 07/01/18 1630 07/01/18 1700 07/01/18 1745  BP: (!) 141/51 (!) 144/54 (!) 110/93 (!) 144/53  Pulse: 82 81 80 81  Resp: (!) 29 (!) 28 (!) 31 (!) 32  Temp:      TempSrc:      SpO2: 94% 96% 96% 95%  Weight:      Height:      PainSc:        Isolation Precautions Droplet precaution  Medications Medications  dextrose 5 %-0.9 % sodium chloride infusion (has no administration in time range)  acetaminophen (TYLENOL) tablet 650 mg (has no administration in time range)    Or  acetaminophen (TYLENOL) suppository 650 mg (has no administration in time range)  ibuprofen (ADVIL,MOTRIN) tablet 400 mg (has no administration in time range)  aspirin EC tablet 81 mg (has no administration in time range)  ipratropium-albuterol (DUONEB) 0.5-2.5 (3) MG/3ML nebulizer solution 3 mL (has no administration in time range)   ceFEPIme (MAXIPIME) 2 g in sodium chloride 0.9 % 100 mL IVPB (has no administration in time range)  vancomycin (VANCOCIN) 1,500 mg in sodium chloride 0.9 % 500 mL IVPB (has no administration in time range)  albuterol (PROVENTIL HFA;VENTOLIN HFA) 108 (90 Base) MCG/ACT inhaler 4 puff (4 puffs Inhalation Given 07/01/18 1416)  methylPREDNISolone sodium succinate (SOLU-MEDROL) 125 mg/2 mL injection 125 mg (125 mg Intravenous Given 07/01/18 1417)  insulin aspart (novoLOG) injection 8 Units (8 Units Intravenous Given 07/01/18 1417)  furosemide (LASIX) injection 20 mg (20 mg Intravenous Given 07/01/18 1504)  azithromycin (ZITHROMAX) tablet 500 mg (500 mg Oral Given 07/01/18 1639)    Mobility walks with device     Focused Assessments Cardiac Assessment Handoff:    Lab Results  Component Value Date   CKTOTAL 51 07/31/2006   TROPONINI <0.03 11/29/2017   Lab Results  Component Value Date   DDIMER 1.42 (H) 10/14/2016   Does the Patient currently have chest pain? No  , Pulmonary Assessment Handoff:  Lung sounds:   O2 Device: Nasal Cannula O2 Flow Rate (L/min): 4 L/min      R Recommendations: See Admitting Provider Note  Report given to:   Additional Notes:

## 2018-07-01 NOTE — ED Provider Notes (Signed)
[facility] EMERGENCY DEPARTMENT Provider Note   CSN: [id number] Arrival date & time: 07/01/18  1252    History   Chief Complaint Chief Complaint  Patient presents with  . Cough    HPI Timothy HALTER Sr. is a 85 y.o. male w/ PMH of HFpEF, AFIB, CAD, end-stage COPD, and dementia presenting with weakness. He states he was in his usual state of health until Saturday when his legs gave out while he was washing his dishes and he fell on to his carpet floor. He denies any loss oc consciousness or trauma to the head. He states prior to this, he had occasional productive cough with clear sputum for the last 2-3 days as well as dyspnea on exertion. He states his strength came back after his nebulizer treatment and he stopped his Lasix and eliquis because he believed these meds could be contributory. He also states he took some of his 'left-over' prednisone from prior prescription with mild improvement in his symptoms. This morning he had another episode where his legs gave out and he fell on his chair so he came to the ED for evaluation. He states these symptoms feel similar to his prior COPD exacerbations. He has been using his home medications as prescribed.  Per chart review, he has had multiple recent hospitalizations for COPD exacerbation and is currently on Duonebs, Asmanex qhs, Stiolto daily for his COPD regimen and is on home oxygen 2L  Past Medical History:  Diagnosis Date  . Allergic rhinitis   . Benign prostatic hypertrophy   . CHF (congestive heart failure) (HCC)   . Chronic diastolic heart failure (HCC)    8/08 ECHO with EF 55%  . CKD (chronic kidney disease)   . COPD (chronic obstructive pulmonary disease) (HCC)    moderate. followed by Dr.Byrum  . Coronary artery disease    s/p CABG 2006. LHC (1/10): SVG-OM and PLOM, SVG-D, and LIMA-LAD were all patent  . Dementia (HCC)    (mild)--06/2006  . Diabetes mellitus   . Edema    localized. suspect diastolic CHF  + venous insufficiency  . Hearing loss    left >>right  . History of tobacco abuse   . Hypertension   . Mixed hyperlipidemia   . Osteoarthritis   . Pneumonia 07/31/2017  . Sleep apnea    no CPAP    Patient Active Problem List   Diagnosis Date Noted  . Advanced directives, counseling. 05/09/2018.  Palliative care following, he is NOT a HOSPICE patient. 05/09/2018  . Palliative care by specialist   . Atrial fibrillation and flutter (HCC)   . Pulmonary HTN (HCC) 07/30/2017  . Solitary pulmonary nodule 12/10/2015  . Aortic calcification, 04/20/2005 CT Chest 04/03/2013  . Sleep apnea   . Tobacco abuse (in remission)   . Chronic low back pain 04/18/2011  . CKD (chronic kidney disease) stage 3, GFR 30-59 ml/min (HCC) 08/18/2010  . OSTEOARTHRITIS 12/06/2009  . DIASTOLIC HEART FAILURE, CHRONIC 08/18/2009  . Mixed hyperlipidemia 06/11/2008  . Coronary artery disease involving autologous artery coronary bypass graft without angina pectoris 06/07/2008  . Type 2 diabetes mellitus with diabetic nephropathy, without long-term current use of insulin (HCC) 09/04/2007  . ALLERGIC RHINITIS 01/30/2007  . HEARING LOSS 07/06/2006  . Essential hypertension 07/06/2006  . BPH (benign prostatic hyperplasia) 07/06/2006  . Centrilobular emphysema (HCC) 08/08/2005    Past Surgical History:  Procedure Laterality Date  . ANGIOPLASTY  [id number]  . CARDIAC CATHETERIZATION  90s   [place], [place]  x1stent  . CARDIAC CATHETERIZATION     [facility];x2 stents  . CARDIAC CATHETERIZATION  04/29/2013  . CARDIOVERSION N/A 12/05/2017   Procedure: CARDIOVERSION;  Surgeon: Elease Hashimoto Deloris Ping, MD;  Location: [facility] ENDOSCOPY;  Service: Cardiovascular;  Laterality: N/A;  . CORONARY ARTERY BYPASS GRAFT  02/2005  . ROTATOR CUFF REPAIR  2005   left, Gioffre  . TEE WITHOUT CARDIOVERSION N/A 12/05/2017   Procedure: TRANSESOPHAGEAL ECHOCARDIOGRAM (TEE);  Surgeon: Elease Hashimoto Deloris Ping, MD;  Location: [facility] ENDOSCOPY;  Service: Cardiovascular;   Laterality: N/A;     Home Medications    Prior to Admission medications   Medication Sig Start Date End Date Taking? Authorizing Provider  albuterol (PROVENTIL HFA;VENTOLIN HFA) 108 (90 Base) MCG/ACT inhaler Inhale 2 puffs into the lungs every 6 (six) hours as needed for wheezing or shortness of breath. 04/12/18   Rolly Salter, MD  apixaban (ELIQUIS) 2.5 MG TABS tablet Take 1 tablet (2.5 mg total) by mouth 2 (two) times daily. 05/17/18   Copland, Karleen Hampshire, MD  aspirin EC 81 MG tablet Take 1 tablet (81 mg total) by mouth daily. 04/15/18   Copland, Karleen Hampshire, MD  Carboxymethylcellulose Sod PF 0.25 % SOLN Place 2 drops into both eyes 2 (two) times daily.     [provider]  doxazosin (CARDURA) 8 MG tablet Take 0.5 tablets (4 mg total) by mouth at bedtime. 05/22/18   Copland, Karleen Hampshire, MD  furosemide (LASIX) 20 MG tablet Take 1 tablet (20 mg total) by mouth daily as needed for fluid or edema. 04/12/18   Rolly Salter, MD  glipiZIDE (GLUCOTROL) 10 MG tablet Take 1 tablet (10 mg total) by mouth 2 (two) times daily before a meal. 06/13/18   Copland, Karleen Hampshire, MD  ipratropium-albuterol (DUONEB) 0.5-2.5 (3) MG/3ML SOLN Take 3 mLs by nebulization 4 (four) times daily. 05/09/18   Copland, Karleen Hampshire, MD  metoprolol (TOPROL-XL) 200 MG 24 hr tablet Take 0.5 tablets (100 mg total) by mouth daily. 05/22/18   Copland, Karleen Hampshire, MD  mometasone [facility]) 220 MCG/INH inhaler Inhale 2 puffs into the lungs daily. 06/06/18   Copland, Karleen Hampshire, MD  Multiple Vitamins-Minerals (CENTRUM SILVER 50+MEN PO) Take 1 tablet by mouth daily.    [provider]  Multiple Vitamins-Minerals (PRESERVISION AREDS 2) CAPS Take 1 capsule by mouth daily with breakfast.     [provider]  ONE TOUCH ULTRA TEST test strip USE TO CHECK BLOOD SUGAR 2 TIMES DAILY AS DIRECTED 09/08/15   Copland, Karleen Hampshire, MD  predniSONE (DELTASONE) 10 MG tablet Take 1 tablet (10 mg total) by mouth daily with breakfast. 05/09/18   Copland, Karleen Hampshire, MD   rosuvastatin (CRESTOR) 40 MG tablet Take 1 tablet (40 mg total) by mouth daily. 09/17/17   Copland, Karleen Hampshire, MD  Tiotropium Bromide-Olodaterol (STIOLTO RESPIMAT) 2.5-2.5 MCG/ACT AERS Inhale 2 puffs into the lungs daily.     [provider]   Family History Family History  Problem Relation Age of Onset  . Heart attack Mother   . Lung cancer Sister   . Prostate cancer Neg Hx   . Kidney cancer Neg Hx    Social History Social History   Tobacco Use  . Smoking status: Former Smoker    Packs/day: 3.00    Years: 60.00    Pack years: 180.00    Types: Cigarettes    Last attempt to quit: 07/12/2005    Years since quitting: 12.9  . Smokeless tobacco: Never Used  Substance Use Topics  . Alcohol use: No  Alcohol/week: 0.0 standard drinks  . Drug use: No     Allergies   Metformin and related; Tramadol hcl; Codeine; Gabapentin; Morphine sulfate; Other; Ticlopidine hcl; Warfarin sodium; Ace inhibitors; and Tramadol   Review of Systems Review of Systems  Constitutional: Negative for chills, fatigue and fever.  Respiratory: Positive for cough and shortness of breath. Negative for chest tightness and wheezing.   Cardiovascular: Positive for leg swelling. Negative for chest pain and palpitations.  Gastrointestinal: Negative for constipation, diarrhea, nausea and vomiting.  Neurological: Positive for weakness. Negative for dizziness, light-headedness, numbness and headaches.  All other systems reviewed and are negative.    Physical Exam Updated Vital Signs BP (!) 132/58   Pulse 87   Temp 98 F (36.7 C) (Oral)   Resp (!) 25   Ht 5\' 7"  (1.702 m)   Wt 77.1 kg   SpO2 94%   BMI 26.63 kg/m   Physical Exam Constitutional:      Appearance: He is ill-appearing (chronically ill-appearing).  HENT:     Mouth/Throat:     Mouth: Mucous membranes are moist.     Pharynx: Oropharynx is clear.  Eyes:     Extraocular Movements: Extraocular movements intact.     Conjunctiva/sclera:  Conjunctivae normal.     Pupils: Pupils are equal, round, and reactive to light.  Neck:     Musculoskeletal: Normal range of motion and neck supple.  Cardiovascular:     Rate and Rhythm: Regular rhythm. Tachycardia present.     Pulses: Normal pulses.     Heart sounds: Normal heart sounds.  Pulmonary:     Effort: Respiratory distress present.     Comments: Prolonged expiratory phase, distant breath sounds Abdominal:     General: Abdomen is flat. Bowel sounds are normal.     Palpations: Abdomen is soft.     Tenderness: There is no abdominal tenderness. There is no guarding.  Musculoskeletal: Normal range of motion.  Skin:    General: Skin is warm and dry.     Findings: Bruising (multiple areas of ecchymosis) present.  Neurological:     Mental Status: He is alert.     Comments: Neurologic exam: Mental status: A&Ox3 Cranial Nerves: II: PERRL III, IV, VI: Extra-occular motions intact bilaterally V, VII: Face symmetric, sensation intact in all 3 divisions  VIII: hearing normal to rubbing fingers bilaterally  IX, X: palate rises symmetrically XI: Head turn and shoulder shrug normal bilaterally  XII: tongue midline  Motor: Strength 5/5 on all upper and lower extremities, bulk muscle and tone are normal  Deep Tendon Reflexes: 2+ symmetric Sensory: Light touch intact and symmetric bilaterally  Coordination: There is no dysmetria on finger-to-nose.  Psychiatric: Normal mood and affect    ED Treatments / Results  Labs (all labs ordered are listed, but only abnormal results are displayed) Labs Reviewed  CBC - Abnormal; Notable for the following components:      Result Value   RBC 3.48 (*)    Hemoglobin 10.5 (*)    HCT 34.8 (*)    All other components within normal limits  BASIC METABOLIC PANEL - Abnormal; Notable for the following components:   Potassium 5.4 (*)    Chloride 92 (*)    Glucose,  Bld 424 (*)    BUN 30 (*)    Creatinine, Ser 1.65 (*)    GFR calc non Af Amer 37 (*)    GFR calc Af Amer 42 (*)    All other components within normal  limits  BRAIN NATRIURETIC PEPTIDE - Abnormal; Notable for the following components:   B Natriuretic Peptide 227.0 (*)    All other components within normal limits  I-STAT TROPONIN, ED    EKG EKG Interpretation  Date/Time:  Monday July 01 2018 13:02:49 EDT Ventricular Rate:  91 PR Interval:    QRS Duration: 161 QT Interval:  387 QTC Calculation: 477 R Axis:   -72 Text Interpretation:  Ectopic atrial rhythm RBBB and LAFB Abnormal T, consider ischemia, lateral leads Confirmed by Kristine RoyalMessick, Peter [phone]([id number]) on 07/01/2018 1:11:10 PM   Radiology Dg Chest Portable 1 View  Result Date: 07/01/2018 CLINICAL DATA:  Shortness of breath. EXAM: PORTABLE CHEST 1 VIEW COMPARISON:  Radiographs of May 03, 2018. FINDINGS: The heart size and mediastinal contours are within normal limits. Both lungs are clear. No pneumothorax or pleural effusion is noted. Status post coronary bypass graft. The visualized skeletal structures are unremarkable. IMPRESSION: No active disease. Electronically Signed   By: Lupita RaiderJames  Green Jr, M.D.   On: 07/01/2018 14:21    Procedures Procedures (including critical care time)  Medications Ordered in ED Medications  albuterol (PROVENTIL HFA;VENTOLIN HFA) 108 (90 Base) MCG/ACT inhaler 4 puff (4 puffs Inhalation Given 07/01/18 1416)  methylPREDNISolone sodium succinate (SOLU-MEDROL) 125 mg/2 mL injection 125 mg (125 mg Intravenous Given 07/01/18 1417)  insulin aspart (novoLOG) injection 8 Units (8 Units Intravenous Given 07/01/18 1417)  furosemide (LASIX) injection 20 mg (20 mg Intravenous Given 07/01/18 1504)     Initial Impression / Assessment and Plan / ED Course  I have reviewed the triage vital signs and the nursing notes.  Pertinent labs & imaging results that were available during my care of the patient were reviewed by me  and considered in my medical decision making (see chart for details).  Mr.Sobotka is a 85 yo M w/ PMH of end-stage COPD, CAD s/p CABG, and Afib on Eliquis presenting with weakness. He is currently satting 91% on 4L (from 2L home oxygen) with significant tightness on lung exam. His neuro exam is benign. Most likely feeling weak due to hypoxia from COPD exacerbation. Will get chest X-ray, bnp, trop to rule out pneumonia, CHF exacerbation or ACS.  Final Clinical Impressions(s) / ED Diagnoses   Final diagnoses:  COPD with acute exacerbation (HCC)   Re-evaluated at bedside. States subjectively significantly improved after solumedrol and inhalers. Chest X-ray shows no acute abnormality, bnp slightly elevated, troponin negative. When trying to get ambulatory pulse ox, patient observed unable to walk due to weakness. Will admit to hospitalist service for COPD exacerbation.  ED Discharge Orders    None       Theotis BarrioLee,  K, MD 07/01/18 1529    Wynetta FinesMessick, Peter C, MD 07/23/18 1455

## 2018-07-01 NOTE — Progress Notes (Signed)
Pt has metoprolol and Cardura scheduled for tonight. Pt NPO possible aspir. Pneuminia, has SLP consult in place. Schorr, NP notified that meds will be held.

## 2018-07-01 NOTE — Progress Notes (Signed)
Pt arrived to [address] 23, identified appropriately, alert and oriented x 4, no family at the bedside. Dyspnea with minimal exertion, pt uses accessory muscles to breat, SpO2 93-96% on 3L nasal cannula. VS stable, no signs of acute distress. Cardiac monitor and continuous oxygen monitor placed on pt, CCMD notified. Pt oriented to room and equipment, instructed to use call bell for assistance, pt verbalized understanding and call bell left within reach. Bed alarm in place. Will continue to monitor and treat pt per MD orders.

## 2018-07-01 NOTE — ED Notes (Signed)
XR at bedside

## 2018-07-01 NOTE — ED Notes (Signed)
Portable XR at bedside

## 2018-07-01 NOTE — Progress Notes (Signed)
Pharmacy Antibiotic Note  Timothy ENERSON Sr. is a 85 y.o. male admitted on 07/01/2018 with pneumonia. Pharmacy has been consulted for vancomycin and cefepime dosing. Pt is afebrile and WBC is WNL. SCr is elevated at 1.65.   Plan: Vancomycin 1500mg  IV Q48H Cefepime 2gm IV x 1 then 1gm IV Q24H F/u renal fxn, C&S, clinical status and peak/trough at SS  Height: 5\' 7"  (170.2 cm) Weight: 170 lb (77.1 kg) IBW/kg (Calculated) : 66.1  Temp (24hrs), Avg:98 F (36.7 C), Min:98 F (36.7 C), Max:98 F (36.7 C)  Recent Labs  Lab 07/01/18 1320  WBC 9.6  CREATININE 1.65*    Estimated Creatinine Clearance: 28.9 mL/min (A) (by C-G formula based on SCr of 1.65 mg/dL (H)).    Allergies  Allergen Reactions  . Metformin And Related Nausea And Vomiting and Rash  . Tramadol Hcl Other (See Comments)    Tremor vs seizure activity  . Codeine Nausea And Vomiting  . Gabapentin Other (See Comments)    "Messed up my nervous system"  . Morphine Sulfate Nausea And Vomiting  . Other Nausea And Vomiting    "Narcotics, in general"  . Ticlopidine Hcl Other (See Comments)    Reaction unknown by patient or family  . Warfarin Sodium Nausea And Vomiting and Other (See Comments)    "Severe fatigue," also  . Ace Inhibitors Rash and Cough  . Tramadol Rash    Antimicrobials this admission: Vanc 3/23>> Cefepime 3/23>> Azithro x 1 3/23  Dose adjustments this admission: N/A  Microbiology results: Pending  Thank you for allowing pharmacy to be a part of this patient's care.  Ravon Mortellaro, Drake Leach 07/01/2018 6:51 PM

## 2018-07-02 ENCOUNTER — Other Ambulatory Visit: Payer: Self-pay

## 2018-07-02 ENCOUNTER — Inpatient Hospital Stay (HOSPITAL_COMMUNITY): Payer: Medicare PPO

## 2018-07-02 ENCOUNTER — Ambulatory Visit: Payer: Medicare PPO | Admitting: Cardiovascular Disease

## 2018-07-02 LAB — GLUCOSE, CAPILLARY
Glucose-Capillary: 246 mg/dL — ABNORMAL HIGH (ref 70–99)
Glucose-Capillary: 331 mg/dL — ABNORMAL HIGH (ref 70–99)
Glucose-Capillary: 197 mg/dL — ABNORMAL HIGH (ref 70–99)
Glucose-Capillary: 196 mg/dL — ABNORMAL HIGH (ref 70–99)

## 2018-07-02 MED ORDER — SODIUM CHLORIDE 0.9 % IV SOLN
INTRAVENOUS | Status: DC
  Administered 2018-07-02: 02:00:00 via INTRAVENOUS

## 2018-07-02 MED ORDER — IPRATROPIUM-ALBUTEROL 0.5-2.5 (3) MG/3ML IN SOLN
3.0000 mL | Freq: Four times a day (QID) | RESPIRATORY_TRACT | Status: DC
  Administered 2018-07-03: 3 mL via RESPIRATORY_TRACT
  Filled 2018-07-02 (×2): qty 3

## 2018-07-02 MED ORDER — INSULIN GLARGINE 100 UNIT/ML ~~LOC~~ SOLN
15.0000 [IU] | Freq: Every day | SUBCUTANEOUS | Status: DC
  Administered 2018-07-02 – 2018-07-03 (×2): 15 [IU] via SUBCUTANEOUS
  Filled 2018-07-02 (×2): qty 0.15

## 2018-07-02 MED ORDER — METHYLPREDNISOLONE SODIUM SUCC 125 MG IJ SOLR
60.0000 mg | Freq: Two times a day (BID) | INTRAMUSCULAR | Status: DC
  Administered 2018-07-02 (×2): 60 mg via INTRAVENOUS
  Filled 2018-07-02 (×2): qty 2

## 2018-07-02 MED ORDER — ENOXAPARIN SODIUM 40 MG/0.4ML ~~LOC~~ SOLN
40.0000 mg | SUBCUTANEOUS | Status: DC
  Administered 2018-07-02: 40 mg via SUBCUTANEOUS
  Filled 2018-07-02: qty 0.4

## 2018-07-02 MED ORDER — FUROSEMIDE 10 MG/ML IJ SOLN
40.0000 mg | Freq: Once | INTRAMUSCULAR | Status: AC
  Administered 2018-07-02: 40 mg via INTRAVENOUS
  Filled 2018-07-02: qty 4

## 2018-07-02 MED ORDER — RESOURCE THICKENUP CLEAR PO POWD
ORAL | Status: DC | PRN
  Filled 2018-07-02: qty 125

## 2018-07-02 MED ORDER — FUROSEMIDE 20 MG PO TABS
20.0000 mg | ORAL_TABLET | Freq: Every day | ORAL | Status: DC
  Administered 2018-07-02: 20 mg via ORAL
  Filled 2018-07-02: qty 1

## 2018-07-02 MED ORDER — SODIUM ZIRCONIUM CYCLOSILICATE 10 G PO PACK
10.0000 g | PACK | Freq: Two times a day (BID) | ORAL | Status: AC
  Administered 2018-07-02 (×2): 10 g via ORAL
  Filled 2018-07-02 (×2): qty 1

## 2018-07-02 MED ORDER — ALBUTEROL SULFATE (2.5 MG/3ML) 0.083% IN NEBU
2.5000 mg | INHALATION_SOLUTION | RESPIRATORY_TRACT | Status: DC | PRN
  Administered 2018-07-03: 2.5 mg via RESPIRATORY_TRACT
  Filled 2018-07-02: qty 3

## 2018-07-02 MED ORDER — DOXYCYCLINE HYCLATE 100 MG PO TABS
100.0000 mg | ORAL_TABLET | Freq: Two times a day (BID) | ORAL | Status: DC
  Administered 2018-07-02 – 2018-07-03 (×3): 100 mg via ORAL
  Filled 2018-07-02 (×3): qty 1

## 2018-07-02 MED ORDER — ORAL CARE MOUTH RINSE
15.0000 mL | Freq: Two times a day (BID) | OROMUCOSAL | Status: DC
  Administered 2018-07-02 (×3): 15 mL via OROMUCOSAL

## 2018-07-02 NOTE — Evaluation (Signed)
Clinical/Bedside Swallow Evaluation Patient Details  Name: Timothy SCIMECA Sr. MRN: [id number] Date of Birth: [birth year]  Today's Date: 07/02/2018 Time: SLP Start Time (ACUTE ONLY): 0908 SLP Stop Time (ACUTE ONLY): 0925 SLP Time Calculation (min) (ACUTE ONLY): 17 min  Past Medical History:  Past Medical History:  Diagnosis Date  . Allergic rhinitis   . Benign prostatic hypertrophy   . CHF (congestive heart failure) (HCC)   . Chronic diastolic heart failure (HCC)    8/08 ECHO with EF 55%  . CKD (chronic kidney disease)   . COPD (chronic obstructive pulmonary disease) (HCC)    moderate. followed by Dr.Byrum  . Coronary artery disease    s/p CABG 2006. LHC (1/10): SVG-OM and PLOM, SVG-D, and LIMA-LAD were all patent  . Dementia (HCC)    (mild)--06/2006  . Diabetes mellitus   . Edema    localized. suspect diastolic CHF + venous insufficiency  . Hearing loss    left >>right  . History of tobacco abuse   . Hypertension   . Mixed hyperlipidemia   . Osteoarthritis   . Pneumonia 07/31/2017  . Sleep apnea    no CPAP   Past Surgical History:  Past Surgical History:  Procedure Laterality Date  . ANGIOPLASTY  [id number]  . CARDIAC CATHETERIZATION  90s   [place], [place] x1stent  . CARDIAC CATHETERIZATION     [facility];x2 stents  . CARDIAC CATHETERIZATION  04/29/2013  . CARDIOVERSION N/A 12/05/2017   Procedure: CARDIOVERSION;  Surgeon: Elease Hashimoto Deloris Ping, MD;  Location: [facility] ENDOSCOPY;  Service: Cardiovascular;  Laterality: N/A;  . CORONARY ARTERY BYPASS GRAFT  02/2005  . ROTATOR CUFF REPAIR  2005   left, Gioffre  . TEE WITHOUT CARDIOVERSION N/A 12/05/2017   Procedure: TRANSESOPHAGEAL ECHOCARDIOGRAM (TEE);  Surgeon: Elease Hashimoto Deloris Ping, MD;  Location: [facility] ENDOSCOPY;  Service: Cardiovascular;  Laterality: N/A;   HPI:  Pt is an 85 yo male presenting with weakness felt to be secondary to hypoxia from COPD exacerbation. CXR without acute disease or consolidation. MBS 06/13/18 recommended regular solids  and nectar thick liquids by tsp only, given aspiration of thin liquids and larger boluses of nectar. Aspiration was described as silent with thin liquids and with a delayed cough following thicker liquids. PMH dementia, chronic diastolic CHF s/p CABG, A. fib, CAD, CKD stage III, diabetes type 2 uncontrolled with complication, OSA (not on CPAP), tobacco abuse, end-stage COPD, PNA   Assessment / Plan / Recommendation Clinical Impression  Pt has a baseline cough that is noted across PO intake, but also seemingly coinciding with trials of thin liquids via spoon. Pt recalls most of the results/recommendations from [facility], but said that he has been having some trouble knowing how to thicken liquids at home. He also acknowledges that he started using a straw instead of a spoon because it "bypasses the area" in which he has trouble. Straw did not appear to be tested on OP MBS, but education was reinforced about that rationale for using a spoon to limit volume and increase airway protection. Despite dyspnea and weakness leading to admission, pt's CXR (and repeat CXR) is clear of infection. Recommend implementing diet and precautions from recent MBS: regular solids and nectar thick liquids via spoon. Would adhere to aspiration precautions and take additional breaks for any SOB. Will f/u for additional training on use of thickener. Pt may also benefit from repeat MBS prior to d/c to determine if there is anything he can safely drink that would given him more volume at a  time to facilitate adequate intake.  SLP Visit Diagnosis: Dysphagia, pharyngeal phase (R13.13)    Aspiration Risk  Mild aspiration risk;Moderate aspiration risk    Diet Recommendation Regular;Nectar-thick liquid   Liquid Administration via: Spoon Medication Administration: Crushed with puree Supervision: Patient able to self feed;Intermittent supervision to cue for compensatory strategies Compensations: Slow rate;Small sips/bites Postural Changes:  Seated upright at 90 degrees    Other  Recommendations Oral Care Recommendations: Oral care BID Other Recommendations: Order thickener from pharmacy;Prohibited food (jello, ice cream, thin soups);Remove water pitcher   Follow up Recommendations (tba)      Frequency and Duration min 2x/week  2 weeks       Prognosis Prognosis for Safe Diet Advancement: Fair      Swallow Study   General HPI: Pt is an 85 yo male presenting with weakness felt to be secondary to hypoxia from COPD exacerbation. CXR without acute disease or consolidation. MBS 06/13/18 recommended regular solids and nectar thick liquids by tsp only, given aspiration of thin liquids and larger boluses of nectar. Aspiration was described as silent with thin liquids and with a delayed cough following thicker liquids. PMH dementia, chronic diastolic CHF s/p CABG, A. fib, CAD, CKD stage III, diabetes type 2 uncontrolled with complication, OSA (not on CPAP), tobacco abuse, end-stage COPD, PNA Type of Study: Bedside Swallow Evaluation Previous Swallow Assessment: see HPI Diet Prior to this Study: NPO Temperature Spikes Noted: No Respiratory Status: Nasal cannula History of Recent Intubation: No Behavior/Cognition: Alert;Cooperative;Pleasant mood Oral Cavity Assessment: Dry Oral Care Completed by SLP: No Oral Cavity - Dentition: Dentures, top;Dentures, bottom Vision: Functional for self-feeding Self-Feeding Abilities: Able to feed self Patient Positioning: Upright in bed Baseline Vocal Quality: Normal Volitional Cough: Strong;Congested    Oral/Motor/Sensory Function Overall Oral Motor/Sensory Function: Within functional limits   Ice Chips Ice chips: Not tested   Thin Liquid Thin Liquid: Impaired Presentation: Spoon Pharyngeal  Phase Impairments: Cough - Delayed    Nectar Thick Nectar Thick Liquid: Impaired Presentation: Spoon;Self Fed Pharyngeal Phase Impairments: Cough - Delayed   Honey Thick Honey Thick Liquid: Not tested    Puree Puree: Impaired Presentation: Self Fed;Spoon Pharyngeal Phase Impairments: Cough - Delayed   Solid     Solid: Impaired Presentation: Self Fed Pharyngeal Phase Impairments: Cough - Delayed      Virl Axe Mikella Linsley 07/02/2018,9:35 AM  Ivar Drape, M.A. CCC-SLP Acute Herbalist [phone] Office [phone]

## 2018-07-02 NOTE — Progress Notes (Addendum)
PROGRESS NOTE    Timothy Cordova SURGEON Sr.  ZOX:[id number] DOB: 1928/11/16 DOA: 07/01/2018 PCP: Hannah Beat, MD  Brief Narrative: This 85 year old male with history of CAD, CABG, A. fib, mild dementia, COPD on chronic respiratory failure on 2 L home O2, chronic diastolic CHF, sleep apnea not on CPAP presented to the ED with weakness and falls. -Patient reports that he has been falling off and on for several months which he attributes to Eliquis. -In addition also reported slight worsening of his chronic cough recently with wheezing he took some leftover prednisone 2 days ago with mild improvement, yesterday morning had another episode where his legs gave out and he fell onto his chair subsequently decided to call EMS and was brought to the ER. - in the ED patient was noted to be afebrile, chest x-ray did not note any pneumonia/infectious process   Assessment & Plan:   Acute hypoxic respiratory failure -Suspect COPD exacerbation -Patient is afebrile, CXR unremarkable, denies flulike symptoms, no leukopenia or other signs or symptoms suggestive of Covid19 -Discontinue vancomycin and cefepime, no evidence of healthcare associated pneumonia based on yesterday's and this morning's x-ray -Add doxycycline for 5 days -Continue IV Solu-Medrol, duo nebs -Wean O2  Chronic respiratory failure/COPD -On 2 L home O2 at baseline, rest as above  Chronic diastolic CHF/pulmonary hypertension -Continue Toprol -Resume Lasix 20 mg daily    Type 2 diabetes mellitus with diabetic nephropathy, without long-term current use of insulin (HCC) -Plan A1c is 8.3, CBGs uncontrolled in the setting of steroids, add Lantus titrate as needed  Paroxysmal atrial fibrillation -On Eliquis at baseline, patient discontinued Eliquis on Wednesday -Discussed high risk of stroke in the absence of anticoagulation patient understands this and refuses to resume anticoagulation at this time  Failure to thrive, frequent falls  -PT/OT would likely need SNF for rehab versus long-term care  Hyperkalemia -etiology unclear, add lokelma x2 doses, Bmet in am  DVT prophylaxis: Add Lovenox Code Status: DNR Family Communication: No family at bedside Disposition Plan: Will likely need SNF versus long-term care  Consultants:    Procedures:   Antimicrobials:    Subjective: -Still with mild cough and wheezing, slight shortness of breath  Objective: Vitals:   07/02/18 0442 07/02/18 0803 07/02/18 0939 07/02/18 0955  BP: (!) 145/53   (!) 107/51  Pulse: 91   99  Resp: 18   (!) 29  Temp: 97.8 F (36.6 C)   98.2 F (36.8 C)  TempSrc: Oral   Oral  SpO2: 94% 95%  97%  Weight: 77.1 kg  81.6 kg   Height:        Intake/Output Summary (Last 24 hours) at 07/02/2018 1012 Last data filed at 07/02/2018 1005 Gross per 24 hour  Intake 196.34 ml  Output 1775 ml  Net -1578.66 ml   Filed Weights   07/01/18 2007 07/02/18 0442 07/02/18 0939  Weight: 77.1 kg 77.1 kg 81.6 kg    Examination:  General exam: Elderly, chronically ill gentleman sitting up in the recliner, no distress Respiratory system: Poor air movement bilaterally, expiratory wheezes noted Cardiovascular system: Irregularly irregular rhythm  gastrointestinal system: Abdomen is nondistended, soft and nontender.Normal bowel sounds heard. Central nervous system: Alert and oriented. No focal neurological deficits. Extremities: No edema Skin: No rashes, lesions or ulcers Psychiatry: Judgement and insight appear normal. Mood & affect appropriate.     Data Reviewed:   CBC: Recent Labs  Lab 07/01/18 1320  WBC 9.6  HGB 10.5*  HCT 34.8*  MCV 100.0  PLT 206   Basic Metabolic Panel: Recent Labs  Lab 07/01/18 1320 07/01/18 2038  NA 135 136  K 5.4* 5.4*  CL 92* 95*  CO2 30 32  GLUCOSE 424* 351*  BUN 30* 32*  CREATININE 1.65* 1.77*  CALCIUM 9.5 9.4  MG  --  2.0   GFR: Estimated Creatinine Clearance: 29.5 mL/min (A) (by C-G formula based on  SCr of 1.77 mg/dL (H)). Liver Function Tests: No results for input(s): AST, ALT, ALKPHOS, BILITOT, PROT, ALBUMIN in the last 168 hours. No results for input(s): LIPASE, AMYLASE in the last 168 hours. No results for input(s): AMMONIA in the last 168 hours. Coagulation Profile: No results for input(s): INR, PROTIME in the last 168 hours. Cardiac Enzymes: No results for input(s): CKTOTAL, CKMB, CKMBINDEX, TROPONINI in the last 168 hours. BNP (last 3 results) No results for input(s): PROBNP in the last 8760 hours. HbA1C: Recent Labs    07/01/18 2038  HGBA1C 8.3*   CBG: Recent Labs  Lab 07/01/18 1840 07/01/18 2307 07/02/18 0738  GLUCAP 276* 314* 246*   Lipid Profile: No results for input(s): CHOL, HDL, LDLCALC, TRIG, CHOLHDL, LDLDIRECT in the last 72 hours. Thyroid Function Tests: No results for input(s): TSH, T4TOTAL, FREET4, T3FREE, THYROIDAB in the last 72 hours. Anemia Panel: No results for input(s): VITAMINB12, FOLATE, FERRITIN, TIBC, IRON, RETICCTPCT in the last 72 hours. Urine analysis:    Component Value Date/Time   COLORURINE YELLOW 11/28/2017 0803   APPEARANCEUR CLEAR 11/28/2017 0803   APPEARANCEUR Clear 11/20/2016 1409   LABSPEC 1.008 11/28/2017 0803   PHURINE 5.0 11/28/2017 0803   GLUCOSEU NEGATIVE 11/28/2017 0803   HGBUR NEGATIVE 11/28/2017 0803   BILIRUBINUR NEGATIVE 11/28/2017 0803   BILIRUBINUR Negative 11/20/2016 1409   KETONESUR NEGATIVE 11/28/2017 0803   PROTEINUR NEGATIVE 11/28/2017 0803   NITRITE NEGATIVE 11/28/2017 0803   LEUKOCYTESUR NEGATIVE 11/28/2017 0803   LEUKOCYTESUR Negative 11/20/2016 1409   Sepsis Labs: @LABRCNTIP (procalcitonin:4,lacticidven:4)  )No results found for this or any previous visit (from the past 240 hour(s)).       Radiology Studies: Dg Chest Port 1 View  Result Date: 07/02/2018 CLINICAL DATA:  Hypoxia EXAM: PORTABLE CHEST 1 VIEW COMPARISON:  July 01, 2018 FINDINGS: Lungs remain hyperexpanded with scattered areas of  mild scarring. There is no edema or consolidation. The heart size and pulmonary vascularity are normal. Patient is status post coronary artery bypass grafting. No adenopathy. There is aortic atherosclerosis. There is a stent in the left innominate region. No bone lesions. Foci of axillary artery calcification noted. IMPRESSION: Lungs remain hyperexpanded. No edema or consolidation. Stable cardiac silhouette. Aortic Atherosclerosis (ICD10-I70.0). Electronically Signed   By: Bretta Bang III M.D.   On: 07/02/2018 08:18   Dg Chest Portable 1 View  Result Date: 07/01/2018 CLINICAL DATA:  Shortness of breath. EXAM: PORTABLE CHEST 1 VIEW COMPARISON:  Radiographs of May 03, 2018. FINDINGS: The heart size and mediastinal contours are within normal limits. Both lungs are clear. No pneumothorax or pleural effusion is noted. Status post coronary bypass graft. The visualized skeletal structures are unremarkable. IMPRESSION: No active disease. Electronically Signed   By: Lupita Raider, M.D.   On: 07/01/2018 14:21        Scheduled Meds: . aspirin EC  325 mg Oral Daily  . doxazosin  4 mg Oral QHS  . insulin aspart  0-20 Units Subcutaneous TID WC  . insulin aspart  0-5 Units Subcutaneous QHS  . ipratropium-albuterol  3 mL Nebulization Q6H  .  mouth rinse  15 mL Mouth Rinse BID  . methylPREDNISolone (SOLU-MEDROL) injection  60 mg Intravenous Q12H  . metoprolol  100 mg Oral Daily  . sodium zirconium cyclosilicate  10 g Oral BID   Continuous Infusions: . ceFEPime (MAXIPIME) IV       LOS: 1 day    Time spent:    Zannie Cove, MD [facility]  07/02/2018, 10:12 AM

## 2018-07-02 NOTE — Consult Note (Signed)
   [facility] CM Inpatient Consult   07/02/2018  CEQUAN PEDERSON Sr. [birth year] [id number]    Patient screened for services with [facility] care management. His unplanned risk score is 24%, high. Patient is a  Nurse, adult. He had previous [facility] telephonic services for COPD management.   Chart review reveals that patient was admitted for acute hypoxic respiratory failure/ COPD exacerbation. Patient's current disposition is not known at this time.    Will follow for progression and disposition of needs as appropriate.     Please place a [facility] Care Management consult or for questions, please contact:    Karin Golden A. Breniya Goertzen, BSN, RN-BC [facility] Liaison Cell: [phone]

## 2018-07-02 NOTE — Telephone Encounter (Signed)
Patient currently admitted

## 2018-07-02 NOTE — Progress Notes (Signed)
Pt c/o SOB, im mild distress, wheezing auscultated in all lobes and crackles in lower lobes. Breathing treatment administered, O2 increased from 3 L to 7 L via nasal cannula. SpO2 98 % once breathing treatment completed. Now pt SpO2 96% on 4L nasal cannula.  Spoken with Schorr, NP. Will decrease fluids, and order Lasix.  Pt in no distress at this time, talking in full sentences.

## 2018-07-02 NOTE — Progress Notes (Signed)
RT called to assess patient, c/o SOB, scheduled neb tx already started by RN. Pt with diminished bbs, some exp wheeze, and crackles noted on L. Post tx, pt states he does feel some relief but appears very anxious when talking with RT. RT will continue to monitor.

## 2018-07-02 NOTE — Progress Notes (Signed)
   Due to the COVID-19 pandemic, [facility] is committed to reducing the risk of viral transmission, patient, provider and staff exposure by limiting procedures considered essential to acute management.  After a thorough review of the chart, we have cancelled the scheduled echocardiographic procedure for the following reasons:  Multiple recent echos with normal LVEF History of diastolic CHF with stable BNP (compared to 4 prior BNPs) COPD exacerbation is a more likely diagnosis  Thank you for your understanding. Please reach out to [facility] if you have any further questions.  Chrystie Nose, MD, [facility], FACP    [facility]  Medical Director of the Advanced Lipid Disorders &  Cardiovascular Risk Reduction Clinic Diplomate of the American Board of Clinical Lipidology Attending Cardiologist  Direct Dial: [phone]  Fax: [phone]  Website:  [email or web address]

## 2018-07-03 ENCOUNTER — Inpatient Hospital Stay (HOSPITAL_COMMUNITY)
Admission: EM | Admit: 2018-07-03 | Discharge: 2018-07-05 | Disposition: A | Discharge status: Home / Self Care | Payer: Medicare PPO | Source: Home / Self Care | Attending: Internal Medicine | Admitting: Internal Medicine

## 2018-07-03 ENCOUNTER — Encounter (HOSPITAL_COMMUNITY): Payer: Self-pay

## 2018-07-03 ENCOUNTER — Ambulatory Visit: Payer: Medicare PPO

## 2018-07-03 ENCOUNTER — Other Ambulatory Visit: Payer: Self-pay

## 2018-07-03 DIAGNOSIS — R0602 Shortness of breath: Secondary | ICD-10-CM

## 2018-07-03 DIAGNOSIS — J441 Chronic obstructive pulmonary disease with (acute) exacerbation: Principal | ICD-10-CM

## 2018-07-03 DIAGNOSIS — E1121 Type 2 diabetes mellitus with diabetic nephropathy: Secondary | ICD-10-CM

## 2018-07-03 DIAGNOSIS — J205 Acute bronchitis due to respiratory syncytial virus: Secondary | ICD-10-CM | POA: Diagnosis present

## 2018-07-03 DIAGNOSIS — Z7189 Other specified counseling: Secondary | ICD-10-CM

## 2018-07-03 DIAGNOSIS — R410 Disorientation, unspecified: Secondary | ICD-10-CM

## 2018-07-03 DIAGNOSIS — R0600 Dyspnea, unspecified: Secondary | ICD-10-CM

## 2018-07-03 DIAGNOSIS — J9621 Acute and chronic respiratory failure with hypoxia: Secondary | ICD-10-CM

## 2018-07-03 DIAGNOSIS — I5033 Acute on chronic diastolic (congestive) heart failure: Secondary | ICD-10-CM

## 2018-07-03 LAB — BASIC METABOLIC PANEL
Anion gap: 11 (ref 5–15)
BUN: 43 mg/dL — ABNORMAL HIGH (ref 8–23)
CO2: 33 mmol/L — ABNORMAL HIGH (ref 22–32)
Calcium: 9.4 mg/dL (ref 8.9–10.3)
Chloride: 96 mmol/L — ABNORMAL LOW (ref 98–111)
Creatinine, Ser: 1.88 mg/dL — ABNORMAL HIGH (ref 0.61–1.24)
GFR calc non Af Amer: 31 mL/min — ABNORMAL LOW (ref >60)
GFR, EST AFRICAN AMERICAN: 36 mL/min — AB (ref >60)
Glucose, Bld: 219 mg/dL — ABNORMAL HIGH (ref 70–99)
POTASSIUM: 4.4 mmol/L (ref 3.5–5.1)
Sodium: 140 mmol/L (ref 135–145)

## 2018-07-03 LAB — CBC
HCT: 31.8 % — ABNORMAL LOW (ref 39.0–52.0)
Hemoglobin: 10 g/dL — ABNORMAL LOW (ref 13.0–17.0)
MCH: 30.7 pg (ref 26.0–34.0)
MCHC: 31.4 g/dL (ref 30.0–36.0)
MCV: 97.5 fL (ref 80.0–100.0)
Platelets: 221 10*3/uL (ref 150–400)
RBC: 3.26 MIL/uL — AB (ref 4.22–5.81)
RDW: 13.6 % (ref 11.5–15.5)
WBC: 10.3 10*3/uL (ref 4.0–10.5)
nRBC: 0 % (ref 0.0–0.2)

## 2018-07-03 LAB — RESPIRATORY PANEL BY PCR
Adenovirus: NOT DETECTED
Bordetella pertussis: NOT DETECTED
Chlamydophila pneumoniae: NOT DETECTED
Coronavirus 229E: NOT DETECTED
Coronavirus HKU1: NOT DETECTED
Coronavirus NL63: NOT DETECTED
Coronavirus OC43: NOT DETECTED
Influenza A: NOT DETECTED
Influenza B: NOT DETECTED
Metapneumovirus: NOT DETECTED
Mycoplasma pneumoniae: NOT DETECTED
Parainfluenza Virus 1: NOT DETECTED
Parainfluenza Virus 2: NOT DETECTED
Parainfluenza Virus 3: NOT DETECTED
Parainfluenza Virus 4: NOT DETECTED
RHINOVIRUS / ENTEROVIRUS - RVPPCR: NOT DETECTED
Respiratory Syncytial Virus: DETECTED — AB

## 2018-07-03 LAB — GLUCOSE, CAPILLARY
Glucose-Capillary: 225 mg/dL — ABNORMAL HIGH (ref 70–99)
Glucose-Capillary: 311 mg/dL — ABNORMAL HIGH (ref 70–99)

## 2018-07-03 MED ORDER — SODIUM CHLORIDE 0.9% FLUSH
3.0000 mL | Freq: Two times a day (BID) | INTRAVENOUS | Status: DC
  Administered 2018-07-04 – 2018-07-05 (×3): 3 mL via INTRAVENOUS

## 2018-07-03 MED ORDER — ENOXAPARIN SODIUM 30 MG/0.3ML ~~LOC~~ SOLN: 30.0000 mg | SUBCUTANEOUS | Status: DC

## 2018-07-03 MED ORDER — SODIUM CHLORIDE 0.9% FLUSH: 3.0000 mL | INTRAVENOUS | Status: DC | PRN

## 2018-07-03 MED ORDER — METHYLPREDNISOLONE SODIUM SUCC 125 MG IJ SOLR
60.0000 mg | Freq: Four times a day (QID) | INTRAMUSCULAR | Status: DC
  Administered 2018-07-04 – 2018-07-05 (×6): 60 mg via INTRAVENOUS
  Filled 2018-07-03 (×6): qty 2

## 2018-07-03 MED ORDER — ACETAMINOPHEN 325 MG PO TABS: 650.0000 mg | ORAL_TABLET | Freq: Four times a day (QID) | ORAL | Status: DC | PRN

## 2018-07-03 MED ORDER — SODIUM CHLORIDE 0.9 % IV SOLN
500.0000 mg | INTRAVENOUS | Status: DC
  Administered 2018-07-03: 500 mg via INTRAVENOUS
  Filled 2018-07-03: qty 500

## 2018-07-03 MED ORDER — ZOLPIDEM TARTRATE 5 MG PO TABS: 5.0000 mg | ORAL_TABLET | Freq: Every evening | ORAL | Status: DC | PRN

## 2018-07-03 MED ORDER — PROSIGHT PO TABS
1.0000 | ORAL_TABLET | Freq: Every day | ORAL | Status: DC
  Administered 2018-07-04 – 2018-07-05 (×2): 1 via ORAL
  Filled 2018-07-03 (×2): qty 1

## 2018-07-03 MED ORDER — DIPHENHYDRAMINE-APAP (SLEEP) 25-500 MG PO TABS: 1.0000 | ORAL_TABLET | Freq: Every evening | ORAL | Status: DC | PRN

## 2018-07-03 MED ORDER — METHYLPREDNISOLONE SODIUM SUCC 125 MG IJ SOLR: 80.0000 mg | Freq: Three times a day (TID) | INTRAMUSCULAR | Status: DC

## 2018-07-03 MED ORDER — NALOXONE HCL 0.4 MG/ML IJ SOLN
INTRAMUSCULAR | Status: AC
  Administered 2018-07-03: 0.4 mg
  Filled 2018-07-03: qty 1

## 2018-07-03 MED ORDER — IPRATROPIUM-ALBUTEROL 0.5-2.5 (3) MG/3ML IN SOLN
3.0000 mL | Freq: Once | RESPIRATORY_TRACT | Status: AC
  Administered 2018-07-03: 3 mL via RESPIRATORY_TRACT
  Filled 2018-07-03: qty 3

## 2018-07-03 MED ORDER — METOPROLOL SUCCINATE ER 100 MG PO TB24
100.0000 mg | ORAL_TABLET | Freq: Every day | ORAL | Status: DC
  Administered 2018-07-04 – 2018-07-05 (×2): 100 mg via ORAL
  Filled 2018-07-03 (×2): qty 1

## 2018-07-03 MED ORDER — ACETAMINOPHEN 500 MG PO TABS
500.0000 mg | ORAL_TABLET | Freq: Every evening | ORAL | Status: DC | PRN
  Administered 2018-07-04: 500 mg via ORAL
  Filled 2018-07-03: qty 1

## 2018-07-03 MED ORDER — FUROSEMIDE 20 MG PO TABS: 20.0000 mg | ORAL_TABLET | Freq: Every day | ORAL | Status: DC | PRN

## 2018-07-03 MED ORDER — INSULIN ASPART 100 UNIT/ML ~~LOC~~ SOLN
0.0000 [IU] | Freq: Every day | SUBCUTANEOUS | Status: DC
  Administered 2018-07-03: 4 [IU] via SUBCUTANEOUS

## 2018-07-03 MED ORDER — FUROSEMIDE 10 MG/ML IJ SOLN
40.0000 mg | Freq: Once | INTRAMUSCULAR | Status: AC
  Administered 2018-07-03: 40 mg via INTRAVENOUS
  Filled 2018-07-03: qty 4

## 2018-07-03 MED ORDER — NALOXONE HCL 0.4 MG/ML IJ SOLN: 0.4000 mg | INTRAMUSCULAR | Status: DC | PRN

## 2018-07-03 MED ORDER — ONDANSETRON HCL 4 MG/2ML IJ SOLN
4.0000 mg | Freq: Four times a day (QID) | INTRAMUSCULAR | Status: DC | PRN
  Administered 2018-07-03: 4 mg via INTRAVENOUS
  Filled 2018-07-03: qty 2

## 2018-07-03 MED ORDER — ENOXAPARIN SODIUM 30 MG/0.3ML ~~LOC~~ SOLN
30.0000 mg | SUBCUTANEOUS | Status: DC
  Administered 2018-07-03 – 2018-07-04 (×2): 30 mg via SUBCUTANEOUS
  Filled 2018-07-03 (×3): qty 0.3

## 2018-07-03 MED ORDER — MOMETASONE FUROATE 220 MCG/INH IN AEPB: 2.0000 | INHALATION_SPRAY | Freq: Every day | RESPIRATORY_TRACT | Status: DC

## 2018-07-03 MED ORDER — ALBUTEROL SULFATE (2.5 MG/3ML) 0.083% IN NEBU: 2.5000 mg | INHALATION_SOLUTION | RESPIRATORY_TRACT | Status: DC | PRN

## 2018-07-03 MED ORDER — ROSUVASTATIN CALCIUM 20 MG PO TABS
40.0000 mg | ORAL_TABLET | Freq: Every day | ORAL | Status: DC
  Administered 2018-07-04 – 2018-07-05 (×2): 40 mg via ORAL
  Filled 2018-07-03 (×2): qty 2

## 2018-07-03 MED ORDER — SODIUM CHLORIDE 0.9 % IV SOLN
1.0000 g | INTRAVENOUS | Status: DC
  Administered 2018-07-03: 1 g via INTRAVENOUS
  Filled 2018-07-03: qty 10

## 2018-07-03 MED ORDER — INSULIN ASPART 100 UNIT/ML ~~LOC~~ SOLN: 0.0000 [IU] | Freq: Three times a day (TID) | SUBCUTANEOUS | Status: DC

## 2018-07-03 MED ORDER — DOXAZOSIN MESYLATE 4 MG PO TABS
4.0000 mg | ORAL_TABLET | Freq: Every day | ORAL | Status: DC
  Administered 2018-07-04 (×2): 4 mg via ORAL
  Filled 2018-07-03 (×2): qty 1

## 2018-07-03 MED ORDER — ONDANSETRON HCL 4 MG PO TABS: 4.0000 mg | ORAL_TABLET | Freq: Four times a day (QID) | ORAL | Status: DC | PRN

## 2018-07-03 MED ORDER — ALBUTEROL SULFATE (2.5 MG/3ML) 0.083% IN NEBU
2.5000 mg | INHALATION_SOLUTION | Freq: Four times a day (QID) | RESPIRATORY_TRACT | Status: DC
  Administered 2018-07-04: 2.5 mg via RESPIRATORY_TRACT
  Filled 2018-07-03 (×2): qty 3

## 2018-07-03 MED ORDER — DIPHENHYDRAMINE HCL 25 MG PO CAPS
25.0000 mg | ORAL_CAPSULE | Freq: Every evening | ORAL | Status: DC | PRN
  Administered 2018-07-04: 25 mg via ORAL
  Filled 2018-07-03: qty 1

## 2018-07-03 MED ORDER — GLIPIZIDE 5 MG PO TABS: 10.0000 mg | ORAL_TABLET | Freq: Two times a day (BID) | ORAL | Status: DC

## 2018-07-03 MED ORDER — MORPHINE SULFATE (PF) 2 MG/ML IV SOLN
2.0000 mg | Freq: Once | INTRAVENOUS | Status: AC
  Administered 2018-07-03: 2 mg via INTRAVENOUS
  Filled 2018-07-03: qty 1

## 2018-07-03 MED ORDER — POLYVINYL ALCOHOL 1.4 % OP SOLN
2.0000 [drp] | Freq: Two times a day (BID) | OPHTHALMIC | Status: DC
  Administered 2018-07-04: 2 [drp] via OPHTHALMIC
  Filled 2018-07-03: qty 15

## 2018-07-03 MED ORDER — GUAIFENESIN ER 600 MG PO TB12
600.0000 mg | ORAL_TABLET | Freq: Two times a day (BID) | ORAL | Status: DC
  Administered 2018-07-04 – 2018-07-05 (×3): 600 mg via ORAL
  Filled 2018-07-03 (×3): qty 1

## 2018-07-03 MED ORDER — MOMETASONE FURO-FORMOTEROL FUM 100-5 MCG/ACT IN AERO
2.0000 | INHALATION_SPRAY | Freq: Two times a day (BID) | RESPIRATORY_TRACT | Status: DC
  Administered 2018-07-03: 2 via RESPIRATORY_TRACT
  Filled 2018-07-03: qty 8.8

## 2018-07-03 MED ORDER — ASPIRIN EC 81 MG PO TBEC
81.0000 mg | DELAYED_RELEASE_TABLET | Freq: Every day | ORAL | Status: DC
  Administered 2018-07-04 – 2018-07-05 (×2): 81 mg via ORAL
  Filled 2018-07-03 (×2): qty 1

## 2018-07-03 MED ORDER — ADULT MULTIVITAMIN W/MINERALS CH
1.0000 | ORAL_TABLET | Freq: Every day | ORAL | Status: DC
  Administered 2018-07-04 – 2018-07-05 (×2): 1 via ORAL
  Filled 2018-07-03 (×2): qty 1

## 2018-07-03 MED ORDER — ACETAMINOPHEN 650 MG RE SUPP: 650.0000 mg | Freq: Four times a day (QID) | RECTAL | Status: DC | PRN

## 2018-07-03 MED ORDER — FUROSEMIDE 10 MG/ML IJ SOLN
60.0000 mg | Freq: Once | INTRAMUSCULAR | Status: AC
  Administered 2018-07-03: 60 mg via INTRAVENOUS
  Filled 2018-07-03: qty 6

## 2018-07-03 MED ORDER — IPRATROPIUM BROMIDE 0.02 % IN SOLN
0.5000 mg | Freq: Four times a day (QID) | RESPIRATORY_TRACT | Status: DC
  Administered 2018-07-04: 0.5 mg via RESPIRATORY_TRACT
  Filled 2018-07-03 (×2): qty 2.5

## 2018-07-03 MED ORDER — MOMETASONE FURO-FORMOTEROL FUM 100-5 MCG/ACT IN AERO
2.0000 | INHALATION_SPRAY | Freq: Two times a day (BID) | RESPIRATORY_TRACT | Status: DC
  Administered 2018-07-04 – 2018-07-05 (×3): 2 via RESPIRATORY_TRACT
  Filled 2018-07-03: qty 8.8

## 2018-07-03 MED ORDER — ONDANSETRON HCL 4 MG/2ML IJ SOLN: 4.0000 mg | Freq: Four times a day (QID) | INTRAMUSCULAR | Status: DC | PRN

## 2018-07-03 MED ORDER — SODIUM CHLORIDE 0.9 % IV SOLN: 250.0000 mL | INTRAVENOUS | Status: DC | PRN

## 2018-07-03 NOTE — Progress Notes (Signed)
Patient states that he is leaving to go home today.  MD in room and explained to patient that it would be against his medical advice for him to leave and explained the risks of leaving, including death.  Patient still states that he is leaving.  Family called and aware

## 2018-07-03 NOTE — Telephone Encounter (Signed)
In the ER "Left AMA earlier today for copd exacerbation 2/2 RSV. Went home, became altered 2/2 not wanting to wear oxygen. EMS called and applied O2 and MS improved.  Also with hematuria Medical team at [facility] can determine whether he is a candidate to restart anticoagulation

## 2018-07-03 NOTE — Consult Note (Signed)
   [facility] CM Inpatient Consult   07/03/2018  Timothy GRIFFETH Sr. 08-25-1928 [id number]    Follow-up note:  Call made and spoke briefly with transition of care CM regarding patient's status update for leaving AMA (Against Medical Advice) earlier today and she states will call back. 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  Per charge RN, as we speak, patient is back to the ED and was pended to the unit for readmit.  Will follow patient for further needs and disposition as appropriate.    For additional information and questions, please call:  Bravery Ketcham A. Marisa Hufstetler, BSN, RN-BC [facility] Liaison Cell: [phone]

## 2018-07-03 NOTE — H&P (Signed)
[facility]                                                                                    Patient Demographics  Timothy Cordova, is a 85 y.o. male  CSN: [id number]  MRN: [id number]  DOB - [birth year]  Admit Date - 07/03/2018  Outpatient Primary MD for the patient is Hannah Beat, MD   With History of -  Past Medical History:  Diagnosis Date  . Allergic rhinitis   . Benign prostatic hypertrophy   . CHF (congestive heart failure) (HCC)   . Chronic diastolic heart failure (HCC)    8/08 ECHO with EF 55%  . CKD (chronic kidney disease)   . COPD (chronic obstructive pulmonary disease) (HCC)    moderate. followed by Dr.Byrum  . Coronary artery disease    s/p CABG 2006. LHC (1/10): SVG-OM and PLOM, SVG-D, and LIMA-LAD were all patent  . Dementia (HCC)    (mild)--06/2006  . Diabetes mellitus   . Edema    localized. suspect diastolic CHF + venous insufficiency  . Hearing loss    left >>right  . History of tobacco abuse   . Hypertension   . Mixed hyperlipidemia   . Osteoarthritis   . Pneumonia 07/31/2017  . Sleep apnea    no CPAP      Past Surgical History:  Procedure Laterality Date  . ANGIOPLASTY  [id number]  . CARDIAC CATHETERIZATION  90s   [place], [place] x1stent  . CARDIAC CATHETERIZATION     [facility];x2 stents  . CARDIAC CATHETERIZATION  04/29/2013  . CARDIOVERSION N/A 12/05/2017   Procedure: CARDIOVERSION;  Surgeon: Elease Hashimoto Deloris Ping, MD;  Location: [facility] ENDOSCOPY;  Service: Cardiovascular;  Laterality: N/A;  . CORONARY ARTERY BYPASS GRAFT  02/2005  . ROTATOR CUFF REPAIR  2005   left, Gioffre  . TEE WITHOUT CARDIOVERSION N/A 12/05/2017   Procedure: TRANSESOPHAGEAL ECHOCARDIOGRAM (TEE);  Surgeon: Elease Hashimoto Deloris Ping, MD;  Location: [facility] ENDOSCOPY;  Service: Cardiovascular;  Laterality: N/A;    in for   Chief Complaint  Patient presents with  . Altered Mental Status  . Hematuria     HPI  Timothy Cordova  is a 85 y.o. male, with past medical history  significant for dementia, chronic diastolic congestive heart failure status post CABG, A. fib, CAD, Beatties mellitus type II, with left hospital AMA today after he was admitted for COPD exacerbation, presenting back again for increasing shortness of breath and confusion.  Patient received neb treatments and oxygen in the emergency room, remained confused but his respiratory rate improved.  Serum blood work did not show any significant change from 3/23 except improvement in his potassium however his viral panel came back positive for RSV.     Review of Systems    Cannot be obtained due to patient's condition   Social History Social History   Tobacco Use  . Smoking status: Former Smoker    Packs/day: 3.00    Years: 60.00    Pack years: 180.00    Types: Cigarettes    Last attempt to quit: 07/12/2005    Years since quitting: 12.9  . Smokeless tobacco: Never Used  Substance Use  Topics  . Alcohol use: No    Alcohol/week: 0.0 standard drinks     Family History Family History  Problem Relation Age of Onset  . Heart attack Mother   . Lung cancer Sister   . Prostate cancer Neg Hx   . Kidney cancer Neg Hx      Prior to Admission medications   Medication Sig Start Date End Date Taking? Authorizing Provider  albuterol (PROVENTIL HFA;VENTOLIN HFA) 108 (90 Base) MCG/ACT inhaler Inhale 2 puffs into the lungs every 6 (six) hours as needed for wheezing or shortness of breath. 04/12/18   Rolly Salter, MD  apixaban (ELIQUIS) 2.5 MG TABS tablet Take 1 tablet (2.5 mg total) by mouth 2 (two) times daily. Patient not taking: Reported on 07/02/2018 05/17/18   Hannah Beat, MD  aspirin EC 81 MG tablet Take 1 tablet (81 mg total) by mouth daily. 04/15/18   Copland, Karleen Hampshire, MD  Carboxymethylcellulose Sod PF 0.25 % SOLN Place 2 drops into both eyes 2 (two) times daily.     [provider]  diphenhydramine-acetaminophen (TYLENOL PM) 25-500 MG TABS tablet Take 1 tablet by mouth at bedtime as  needed (sleep).    [provider]  doxazosin (CARDURA) 8 MG tablet Take 0.5 tablets (4 mg total) by mouth at bedtime. 05/22/18   Copland, Karleen Hampshire, MD  furosemide (LASIX) 20 MG tablet Take 1 tablet (20 mg total) by mouth daily as needed for fluid or edema. 04/12/18   Rolly Salter, MD  glipiZIDE (GLUCOTROL) 10 MG tablet Take 1 tablet (10 mg total) by mouth 2 (two) times daily before a meal. 06/13/18   Copland, Karleen Hampshire, MD  ipratropium-albuterol (DUONEB) 0.5-2.5 (3) MG/3ML SOLN Take 3 mLs by nebulization 4 (four) times daily. 05/09/18   Copland, Karleen Hampshire, MD  metoprolol (TOPROL-XL) 200 MG 24 hr tablet Take 0.5 tablets (100 mg total) by mouth daily. 05/22/18   Copland, Karleen Hampshire, MD  mometasone [facility]) 220 MCG/INH inhaler Inhale 2 puffs into the lungs daily. 06/06/18   Copland, Karleen Hampshire, MD  Multiple Vitamins-Minerals (CENTRUM SILVER 50+MEN PO) Take 1 tablet by mouth daily.    [provider]  Multiple Vitamins-Minerals (PRESERVISION AREDS 2) CAPS Take 1 capsule by mouth daily with breakfast.     [provider]  ONE TOUCH ULTRA TEST test strip USE TO CHECK BLOOD SUGAR 2 TIMES DAILY AS DIRECTED Patient taking differently: 2 (two) times daily.  09/08/15   Copland, Karleen Hampshire, MD  predniSONE (DELTASONE) 10 MG tablet Take 1 tablet (10 mg total) by mouth daily with breakfast. Patient not taking: Reported on 07/02/2018 05/09/18   Copland, Karleen Hampshire, MD  rosuvastatin (CRESTOR) 40 MG tablet Take 1 tablet (40 mg total) by mouth daily. 09/17/17   Copland, Karleen Hampshire, MD  Tiotropium Bromide-Olodaterol (STIOLTO RESPIMAT) 2.5-2.5 MCG/ACT AERS Inhale 2 puffs into the lungs daily.     [provider]    Allergies  Allergen Reactions  . Metformin And Related Nausea And Vomiting and Rash  . Tramadol Hcl Other (See Comments)    Tremor vs seizure activity  . Codeine Nausea And Vomiting  . Gabapentin Other (See Comments)    "Messed up my nervous system"  . Morphine Sulfate Nausea And Vomiting  .  Other Nausea And Vomiting    "Narcotics, in general"  . Ticlopidine Hcl Other (See Comments)    Reaction unknown by patient or family  . Warfarin Sodium Nausea And Vomiting and Other (See Comments)    "Severe fatigue," also  . Xarelto [  Rivaroxaban] Other (See Comments)    Weak joints (falls)  . Ace Inhibitors Rash and Cough  . Tramadol Rash    Physical Exam  Vitals  Blood pressure (!) 126/58, pulse 85, temperature 97.6 F (36.4 C), temperature source Oral, resp. rate (!) 25, SpO2 100 %.   1. General chronically ill  2. Normal affect and insight, Not Suicidal or Homicidal, Awake Alert, Oriented X 3.  3. No F.N deficits, ALL C.Nerves Intact, Strength 5/5 all 4 extremities, Sensation intact all 4 extremities, Plantars down going.  4. Ears and Eyes appear Normal, Conjunctivae clear, PERRLA. Moist Oral Mucosa.  5. Supple Neck, No JVD, No cervical lymphadenopathy appriciated, No Carotid Bruits.  6. Symmetrical Chest wall movement, significant wheezing and rhonchi bilaterally.  7.  Irregular, No Gallops, Rubs or Murmurs, No Parasternal Heave.  8. Positive Bowel Sounds, Abdomen Soft, Non tender, No organomegaly appriciated,No rebound -guarding or rigidity.  9.  No Cyanosis, Normal Skin Turgor, No Skin Rash or Bruise.  10. Good muscle tone,  joints appear normal , no effusions, Normal ROM.    Data Review  CBC Recent Labs  Lab 07/01/18 1320 07/03/18 0353  WBC 9.6 10.3  HGB 10.5* 10.0*  HCT 34.8* 31.8*  PLT 206 221  MCV 100.0 97.5  MCH 30.2 30.7  MCHC 30.2 31.4  RDW 13.6 13.6   ------------------------------------------------------------------------------------------------------------------  Chemistries  Recent Labs  Lab 07/01/18 1320 07/01/18 2038 07/03/18 0353  NA 135 136 140  K 5.4* 5.4* 4.4  CL 92* 95* 96*  CO2 30 32 33*  GLUCOSE 424* 351* 219*  BUN 30* 32* 43*  CREATININE 1.65* 1.77* 1.88*  CALCIUM 9.5 9.4 9.4  MG  --  2.0  --     ------------------------------------------------------------------------------------------------------------------ estimated creatinine clearance is 27.8 mL/min (A) (by C-G formula based on SCr of 1.88 mg/dL (H)). ------------------------------------------------------------------------------------------------------------------ No results for input(s): TSH, T4TOTAL, T3FREE, THYROIDAB in the last 72 hours.  Invalid input(s): FREET3   Coagulation profile No results for input(s): INR, PROTIME in the last 168 hours. ------------------------------------------------------------------------------------------------------------------- No results for input(s): DDIMER in the last 72 hours. -------------------------------------------------------------------------------------------------------------------  Cardiac Enzymes No results for input(s): CKMB, TROPONINI, MYOGLOBIN in the last 168 hours.  Invalid input(s): CK ------------------------------------------------------------------------------------------------------------------ Invalid input(s): POCBNP   ---------------------------------------------------------------------------------------------------------------  Urinalysis    Component Value Date/Time   COLORURINE YELLOW 11/28/2017 0803   APPEARANCEUR CLEAR 11/28/2017 0803   APPEARANCEUR Clear 11/20/2016 1409   LABSPEC 1.008 11/28/2017 0803   PHURINE 5.0 11/28/2017 0803   GLUCOSEU NEGATIVE 11/28/2017 0803   HGBUR NEGATIVE 11/28/2017 0803   BILIRUBINUR NEGATIVE 11/28/2017 0803   BILIRUBINUR Negative 11/20/2016 1409   KETONESUR NEGATIVE 11/28/2017 0803   PROTEINUR NEGATIVE 11/28/2017 0803   NITRITE NEGATIVE 11/28/2017 0803   LEUKOCYTESUR NEGATIVE 11/28/2017 0803   LEUKOCYTESUR Negative 11/20/2016 1409    ----------------------------------------------------------------------------------------------------------------     Imaging results:   Dg Op Swallowing  Func-medicare/speech Path  Result Date: 06/13/2018 CLINICAL DATA:  Dysphagia EXAM: MODIFIED BARIUM SWALLOW TECHNIQUE: Different consistencies of barium were administered orally to the patient by the Speech Pathologist. Imaging of the pharynx was performed in the lateral projection. The radiologist was present in the fluoroscopy room for this study, providing personal supervision. FLUOROSCOPY TIME:  Fluoroscopy Time:  1.1 minute Radiation Exposure Index (if provided by the fluoroscopic device): 2.1 mGy Number of Acquired Spot Images: 0 COMPARISON:  None. FINDINGS: Thin liquid: Laryngeal penetration with tracheal aspiration. Thick liquid: Laryngeal penetration with tracheal aspiration when ingested with a cup. No laryngeal penetration  or tracheal aspiration when ingested by the spoonful. Nectar/applesauce/cracker: No laryngeal penetration or tracheal aspiration. IMPRESSION: Modified barium swallow as described above. Please refer to the Speech Pathologists report for complete details and recommendations. Electronically Signed   By: Elige Ko   On: 06/13/2018 11:31   Dg Chest Port 1 View  Result Date: 07/02/2018 CLINICAL DATA:  Hypoxia EXAM: PORTABLE CHEST 1 VIEW COMPARISON:  July 01, 2018 FINDINGS: Lungs remain hyperexpanded with scattered areas of mild scarring. There is no edema or consolidation. The heart size and pulmonary vascularity are normal. Patient is status post coronary artery bypass grafting. No adenopathy. There is aortic atherosclerosis. There is a stent in the left innominate region. No bone lesions. Foci of axillary artery calcification noted. IMPRESSION: Lungs remain hyperexpanded. No edema or consolidation. Stable cardiac silhouette. Aortic Atherosclerosis (ICD10-I70.0). Electronically Signed   By: Bretta Bang III M.D.   On: 07/02/2018 08:18   Dg Chest Portable 1 View  Result Date: 07/01/2018 CLINICAL DATA:  Shortness of breath. EXAM: PORTABLE CHEST 1 VIEW COMPARISON:  Radiographs  of May 03, 2018. FINDINGS: The heart size and mediastinal contours are within normal limits. Both lungs are clear. No pneumothorax or pleural effusion is noted. Status post coronary bypass graft. The visualized skeletal structures are unremarkable. IMPRESSION: No active disease. Electronically Signed   By: Lupita Raider, M.D.   On: 07/01/2018 14:21    My personal review of EKG: Ectopic atrial rhythm at 89 bpm with PACs, right bundle branch block /LAFB  Assessment & Plan  RSV bronchitis Continue supportive care  COPD exacerbation Continue Solu-Medrol and bronchodilators Continue with Dulera Solitary pulmonary nodule To follow-up as outpatient  Diastolic congestive heart failure/pulmonary hypertension Strict I's and O's Continue with beta-blocker Lasix on hold  Chronic kidney disease stage III Stable  Diabetes mellitus type 2 Continue with ISS and glipizide  History of paroxysmal A. Fib Off Eliquis, discussed with PMD    DVT Prophylaxis Lovenox  AM Labs Ordered, also please review Full Orders    Code Status full  Disposition Plan: Home  Time spent in minutes : 42 minutes  Condition GUARDED   @

## 2018-07-03 NOTE — ED Triage Notes (Signed)
Pt from home with ems for AMS and hematuria. Pt left AMA from [facility] this morning because he felt better and was ready to go. Pt was being treated for pneumonia, COVID negative. Upon ems arrival pt was on NRB at 4L at home, altered. EMS placed 4L [place] on pt and pt became more oriented. Pt arrives to ED alert and oriented. Denies SOB but pt tachypnic. Lungs sound crackled.  VSS

## 2018-07-03 NOTE — Progress Notes (Signed)
CRITICAL VALUE ALERT  Critical Value:  RSV (+)  Date & Time Notied:  07/03/18   10:30am  Provider Notified: Hanley Ben  Orders Received/Actions taken: No new orders

## 2018-07-03 NOTE — ED Notes (Signed)
ED Provider at bedside. 

## 2018-07-03 NOTE — ED Provider Notes (Signed)
[facility] EMERGENCY DEPARTMENT Provider Note   CSN: [id number] Arrival date & time: 07/03/18  1738    History   Chief Complaint Chief Complaint  Patient presents with  . Altered Mental Status  . Hematuria    HPI Timothy ARBON Sr. is a 85 y.o. male.     Left AMA earlier today for copd exacerbation 2/2 RSV. Went home, became altered 2/2 not wanting to wear oxygen. EMS called and applied O2 and MS improved.  Discussed with wife on phone, agrees.  Patient willing to stay in hospital this time.    Hematuria  Associated symptoms include shortness of breath.  Shortness of Breath  Severity:  Moderate Onset quality:  Sudden Duration:  6 hours Timing:  Constant Chronicity:  New Context: not activity and not animal exposure   Relieved by:  Nothing Worsened by:  Nothing Ineffective treatments:  None tried   Past Medical History:  Diagnosis Date  . Allergic rhinitis   . Benign prostatic hypertrophy   . CHF (congestive heart failure) (HCC)   . Chronic diastolic heart failure (HCC)    8/08 ECHO with EF 55%  . CKD (chronic kidney disease)   . COPD (chronic obstructive pulmonary disease) (HCC)    moderate. followed by Dr.Byrum  . Coronary artery disease    s/p CABG 2006. LHC (1/10): SVG-OM and PLOM, SVG-D, and LIMA-LAD were all patent  . Dementia (HCC)    (mild)--06/2006  . Diabetes mellitus   . Edema    localized. suspect diastolic CHF + venous insufficiency  . Hearing loss    left >>right  . History of tobacco abuse   . Hypertension   . Mixed hyperlipidemia   . Osteoarthritis   . Pneumonia 07/31/2017  . Sleep apnea    no CPAP    Patient Active Problem List   Diagnosis Date Noted  . Acute bronchitis due to respiratory syncytial virus (RSV) 07/03/2018  . COPD exacerbation (HCC) 07/01/2018  . Advanced directives, counseling. 05/09/2018.  Palliative care following, he is NOT a HOSPICE patient. 05/09/2018  . Palliative care by specialist    . Atrial fibrillation and flutter (HCC)   . Pulmonary HTN (HCC) 07/30/2017  . Solitary pulmonary nodule 12/10/2015  . Aortic calcification, 04/20/2005 CT Chest 04/03/2013  . Sleep apnea   . Tobacco abuse (in remission)   . Chronic low back pain 04/18/2011  . CKD (chronic kidney disease) stage 3, GFR 30-59 ml/min (HCC) 08/18/2010  . OSTEOARTHRITIS 12/06/2009  . DIASTOLIC HEART FAILURE, CHRONIC 08/18/2009  . Mixed hyperlipidemia 06/11/2008  . Coronary artery disease involving autologous artery coronary bypass graft without angina pectoris 06/07/2008  . Type 2 diabetes mellitus with diabetic nephropathy, without long-term current use of insulin (HCC) 09/04/2007  . ALLERGIC RHINITIS 01/30/2007  . HEARING LOSS 07/06/2006  . Essential hypertension 07/06/2006  . BPH (benign prostatic hyperplasia) 07/06/2006  . Centrilobular emphysema (HCC) 08/08/2005    Past Surgical History:  Procedure Laterality Date  . ANGIOPLASTY  [id number]  . CARDIAC CATHETERIZATION  90s   [place], [place] x1stent  . CARDIAC CATHETERIZATION     [facility];x2 stents  . CARDIAC CATHETERIZATION  04/29/2013  . CARDIOVERSION N/A 12/05/2017   Procedure: CARDIOVERSION;  Surgeon: Elease Hashimoto Deloris Ping, MD;  Location: [facility] ENDOSCOPY;  Service: Cardiovascular;  Laterality: N/A;  . CORONARY ARTERY BYPASS GRAFT  02/2005  . ROTATOR CUFF REPAIR  2005   left, Gioffre  . TEE WITHOUT CARDIOVERSION N/A 12/05/2017   Procedure: TRANSESOPHAGEAL ECHOCARDIOGRAM (TEE);  Surgeon: Elease Hashimoto Deloris Ping, MD;  Location: [name] ENDOSCOPY;  Service: Cardiovascular;  Laterality: N/A;        Home Medications    Prior to Admission medications   Medication Sig Start Date End Date Taking? Authorizing Provider  albuterol (PROVENTIL HFA;VENTOLIN HFA) 108 (90 Base) MCG/ACT inhaler Inhale 2 puffs into the lungs every 6 (six) hours as needed for wheezing or shortness of breath. 04/12/18   Rolly Salter, MD  apixaban (ELIQUIS) 2.5 MG TABS tablet Take 1 tablet (2.5 mg total) by  mouth 2 (two) times daily. Patient not taking: Reported on 07/02/2018 05/17/18   Hannah Beat, MD  aspirin EC 81 MG tablet Take 1 tablet (81 mg total) by mouth daily. 04/15/18   Copland, Karleen Hampshire, MD  Carboxymethylcellulose Sod PF 0.25 % SOLN Place 2 drops into both eyes 2 (two) times daily.     [provider]  diphenhydramine-acetaminophen (TYLENOL PM) 25-500 MG TABS tablet Take 1 tablet by mouth at bedtime as needed (sleep).    [provider]  doxazosin (CARDURA) 8 MG tablet Take 0.5 tablets (4 mg total) by mouth at bedtime. 05/22/18   Copland, Karleen Hampshire, MD  furosemide (LASIX) 20 MG tablet Take 1 tablet (20 mg total) by mouth daily as needed for fluid or edema. 04/12/18   Rolly Salter, MD  glipiZIDE (GLUCOTROL) 10 MG tablet Take 1 tablet (10 mg total) by mouth 2 (two) times daily before a meal. 06/13/18   Copland, Karleen Hampshire, MD  ipratropium-albuterol (DUONEB) 0.5-2.5 (3) MG/3ML SOLN Take 3 mLs by nebulization 4 (four) times daily. 05/09/18   Copland, Karleen Hampshire, MD  metoprolol (TOPROL-XL) 200 MG 24 hr tablet Take 0.5 tablets (100 mg total) by mouth daily. 05/22/18   Copland, Karleen Hampshire, MD  mometasone [facility]) 220 MCG/INH inhaler Inhale 2 puffs into the lungs daily. 06/06/18   Copland, Karleen Hampshire, MD  Multiple Vitamins-Minerals (CENTRUM SILVER 50+MEN PO) Take 1 tablet by mouth daily.    [provider]  Multiple Vitamins-Minerals (PRESERVISION AREDS 2) CAPS Take 1 capsule by mouth daily with breakfast.     [provider]  ONE TOUCH ULTRA TEST test strip USE TO CHECK BLOOD SUGAR 2 TIMES DAILY AS DIRECTED Patient taking differently: 2 (two) times daily.  09/08/15   Copland, Karleen Hampshire, MD  predniSONE (DELTASONE) 10 MG tablet Take 1 tablet (10 mg total) by mouth daily with breakfast. Patient not taking: Reported on 07/02/2018 05/09/18   Copland, Karleen Hampshire, MD  rosuvastatin (CRESTOR) 40 MG tablet Take 1 tablet (40 mg total) by mouth daily. 09/17/17   Copland, Karleen Hampshire, MD  Tiotropium  Bromide-Olodaterol (STIOLTO RESPIMAT) 2.5-2.5 MCG/ACT AERS Inhale 2 puffs into the lungs daily.     [provider]    Family History Family History  Problem Relation Age of Onset  . Heart attack Mother   . Lung cancer Sister   . Prostate cancer Neg Hx   . Kidney cancer Neg Hx     Social History Social History   Tobacco Use  . Smoking status: Former Smoker    Packs/day: 3.00    Years: 60.00    Pack years: 180.00    Types: Cigarettes    Last attempt to quit: 07/12/2005    Years since quitting: 12.9  . Smokeless tobacco: Never Used  Substance Use Topics  . Alcohol use: No    Alcohol/week: 0.0 standard drinks  . Drug use: No     Allergies   Metformin and related; Tramadol hcl; Codeine; Gabapentin; Morphine sulfate; Other;  Ticlopidine hcl; Warfarin sodium; Xarelto [rivaroxaban]; Ace inhibitors; and Tramadol   Review of Systems Review of Systems  Respiratory: Positive for shortness of breath.   Genitourinary: Positive for hematuria.  All other systems reviewed and are negative.    Physical Exam Updated Vital Signs BP (!) 126/58   Pulse 85   Temp 97.6 F (36.4 C) (Oral)   Resp (!) 25   SpO2 100%   Physical Exam Vitals signs and nursing note reviewed.  Constitutional:      Appearance: He is well-developed.  HENT:     Head: Normocephalic and atraumatic.  Eyes:     Extraocular Movements: Extraocular movements intact.     Conjunctiva/sclera: Conjunctivae normal.  Neck:     Musculoskeletal: Normal range of motion.  Cardiovascular:     Rate and Rhythm: Normal rate.  Pulmonary:     Effort: Pulmonary effort is normal. No respiratory distress.  Abdominal:     General: Abdomen is flat. There is no distension.  Musculoskeletal: Normal range of motion.  Skin:    General: Skin is warm and dry.  Neurological:     General: No focal deficit present.     Mental Status: He is alert.      ED Treatments / Results  Labs (all labs ordered are listed, but  only abnormal results are displayed) Labs Reviewed - No data to display  EKG None  Radiology Dg Chest [facility] 1 View  Result Date: 07/02/2018 CLINICAL DATA:  Hypoxia EXAM: PORTABLE CHEST 1 VIEW COMPARISON:  July 01, 2018 FINDINGS: Lungs remain hyperexpanded with scattered areas of mild scarring. There is no edema or consolidation. The heart size and pulmonary vascularity are normal. Patient is status post coronary artery bypass grafting. No adenopathy. There is aortic atherosclerosis. There is a stent in the left innominate region. No bone lesions. Foci of axillary artery calcification noted. IMPRESSION: Lungs remain hyperexpanded. No edema or consolidation. Stable cardiac silhouette. Aortic Atherosclerosis (ICD10-I70.0). Electronically Signed   By: Bretta Bang III M.D.   On: 07/02/2018 08:18    Procedures Procedures (including critical care time)  Medications Ordered in ED Medications  enoxaparin (LOVENOX) injection 30 mg (has no administration in time range)  ipratropium-albuterol (DUONEB) 0.5-2.5 (3) MG/3ML nebulizer solution 3 mL (3 mLs Nebulization Given 07/03/18 1817)     Initial Impression / Assessment and Plan / ED Course  I have reviewed the triage vital signs and the nursing notes.  Pertinent labs & imaging results that were available during my care of the patient were reviewed by me and considered in my medical decision making (see chart for details).  HDS. Not significantly hypoxic. Discussed with hospitalist, will readmit.   Final Clinical Impressions(s) / ED Diagnoses   Final diagnoses:  Confusion  SOB (shortness of breath)    ED Discharge Orders    None       Tallula Grindle, Barbara Cower, MD 07/03/18 [phone]

## 2018-07-03 NOTE — Progress Notes (Signed)
   07/03/18 1206  What Happened  Was fall witnessed? No  Was patient injured? No  Patient found on floor  Found by Staff-comment  Stated prior activity ambulating-unassisted  Follow Up  MD notified Hanley Ben  Time MD notified 1218  Family notified No- patient refusal  Additional tests No  Progress note created (see row info) Yes  Adult Fall Risk Assessment  Risk Factor Category (scoring not indicated) History of more than one fall within 6 months before admission (document High fall risk);Fall has occurred during this admission (document High fall risk)  Patient Fall Risk Level High fall risk  Adult Fall Risk Interventions  Required Bundle Interventions *See Row Information* High fall risk - low, moderate, and high requirements implemented  Additional Interventions Use of appropriate toileting equipment (bedpan, BSC, etc.)  Screening for Fall Injury Risk (To be completed on HIGH fall risk patients) - Assessing Need for Low Bed  Risk For Fall Injury- Low Bed Criteria 85 years or older;Previous fall this admission  Will Implement Low Bed and Floor Mats Yes  Vitals  Temp (!) 97.5 F (36.4 C)  Temp Source Oral  BP (!) 129/46  MAP (mmHg) 67  BP Location Left Arm  BP Method Automatic  Patient Position (if appropriate) Sitting  Pulse Rate 91  Pulse Rate Source Monitor  Resp (!) 22  Oxygen Therapy  SpO2 98 %  O2 Device Nasal Cannula  Pain Assessment  Pain Scale 0-10  Pain Score 0  PCA/Epidural/Spinal Assessment  Respiratory Pattern Regular;Unlabored  Neurological  Level of Consciousness Alert  Orientation Level Oriented X4  Cognition Appropriate at baseline  Speech Clear  Glasgow Coma Scale  Eye Opening 4  Best Verbal Response (NON-intubated) 5  Best Motor Response 6  Glasgow Coma Scale Score 15  Musculoskeletal  Musculoskeletal (WDL) X  Assistive Device None  Generalized Weakness Yes  Weight Bearing Restrictions No  Integumentary  Integumentary (WDL) X

## 2018-07-03 NOTE — Progress Notes (Signed)
  Speech Language Pathology Treatment: Dysphagia  Patient Details Name: Timothy COZZA Sr. MRN: [id number] DOB: 15-Mar-1929 Today's Date: 07/03/2018 Time: 6948-5462 SLP Time Calculation (min) (ACUTE ONLY): 18 min  Assessment / Plan / Recommendation Clinical Impression  Pt today is adamant about going home. He does not want any additional swallow studies done and does not want HH SLP to follow up with him for additional training, stating that the only reason he was having trouble was because of his medications and that he will not need any help after that clears his system. We discussed the PNA that he says he has had "many times" over the last 2-3 years, and how this could be suggestive of a more chronic dysphagia. We also discussed the potential to have liquids not just by spoon and to have help with learning how to use the thickener at home. He continues to decline. SLP demonstrated how to thicken liquids and had pt assist during demonstration so that he could be aware. Despite talking about drinking liquids only via spoon, he still proceeded to try to drink via cup. Min cues were needed to follow precautions, but anticipate that he would need additional support upon initial return home. Will continue to follow as able.   HPI HPI: Pt is an 85 yo male presenting with weakness felt to be secondary to hypoxia from COPD exacerbation. CXR without acute disease or consolidation. MBS 06/13/18 recommended regular solids and nectar thick liquids by tsp only, given aspiration of thin liquids and larger boluses of nectar. Aspiration was described as silent with thin liquids and with a delayed cough following thicker liquids. PMH dementia, chronic diastolic CHF s/p CABG, A. fib, CAD, CKD stage III, diabetes type 2 uncontrolled with complication, OSA (not on CPAP), tobacco abuse, end-stage COPD, PNA      SLP Plan  Continue with current plan of care       Recommendations  Diet recommendations:  Regular;Nectar-thick liquid Liquids provided via: Teaspoon Medication Administration: Crushed with puree Supervision: Patient able to self feed;Intermittent supervision to cue for compensatory strategies Compensations: Slow rate;Small sips/bites Postural Changes and/or Swallow Maneuvers: Seated upright 90 degrees                Oral Care Recommendations: Oral care BID Follow up Recommendations: Home health SLP;24 hour supervision/assistance (although pt is currently refusing) SLP Visit Diagnosis: Dysphagia, pharyngeal phase (R13.13) Plan: Continue with current plan of care       GO                Virl Axe Mckale Haffey 07/03/2018, 10:42 AM  Ivar Drape, M.A. CCC-SLP Acute Herbalist [phone] Office [phone]

## 2018-07-03 NOTE — Progress Notes (Signed)
Patient complains of shortness of breath.  Oxygen saturation noted low to mid 80's and tachypenia at 30.  Respiratory therapy notified for breathing treatment due to inspiratory and expiratory wheezes.  Patient saturations remain in low 80's on 4liters via nasal canula.  Non-rebreather applied and Hospitalist notified.  New orders received (please see MAR).  Medications helped with tachypnia however, remains hypoxic. Oxygen saturations increased gradually with non-rebreather on 6 liters noted 99-100%.  Foley catheter placed due to urine retention.  Patient remains in stable condition, will continue to monitor.

## 2018-07-03 NOTE — Progress Notes (Signed)
Patient ID: Timothy Postinhomas O Seats Sr., male   DOB: 1928/12/03, 85 y.o.   MRN: [id number]  PROGRESS NOTE    Timothy Postinhomas O Decker Sr.  WUJ:[id number] DOB: 1928/12/03 DOA: 07/01/2018 PCP: Hannah Beatopland, [place], MD   Brief Narrative:  85 year old male with history of CAD, CABG, A. fib, mild dementia, COPD, chronic hypoxic respiratory failure on home oxygen, chronic diastolic CHF, sleep apnea not on CPAP presented with weakness and falls along with worsening cough and shortness of breath.  He was admitted with respiratory failure secondary COPD exacerbation and started on Solu-Medrol.  Assessment & Plan:   Active Problems:   Type 2 diabetes mellitus with diabetic nephropathy, without long-term current use of insulin (HCC)   Essential hypertension   Coronary artery disease involving autologous artery coronary bypass graft without angina pectoris   DIASTOLIC HEART FAILURE, CHRONIC   Centrilobular emphysema (HCC)   CKD (chronic kidney disease) stage 3, GFR 30-59 ml/min (HCC)   Chronic low back pain   Sleep apnea   Tobacco abuse (in remission)   Solitary pulmonary nodule   Pulmonary HTN (HCC)   Atrial fibrillation and flutter (HCC)   Advanced directives, counseling. 05/09/2018.  Palliative care following, he is NOT a HOSPICE patient.   COPD exacerbation (HCC)  Acute on chronic hypoxic respiratory failure -Most likely secondary to COPD exacerbation -Respiratory status became worse overnight and patient was placed on high flow nasal cannula. -Chest x-ray on admission was negative for infiltrates.  Will repeat chest x-ray.  Will empirically start Rocephin and Zithromax. -Wean off oxygen as able.  Incentive spirometry.  SLP evaluation.  Unclear if the patient is aspirating patient was initially started on vancomycin and cefepime which were discontinued -We will request palliative care consultation  Probable COPD exacerbation  -Currently wheezing.  Flu antigen negative.  Will get respiratory virus panel.  -Increase Solu-Medrol to 80 mg IV every 8 hours.  Add Dulera.  Duo nebs  Acute on chronic diastolic CHF/pulmonary  -We will give Lasix 60 mg IV x1 now.  Strict input and output.  Daily weights.  Continue Toprol.  Outpatient follow-up with cardiology.  Cardiology team has DC'd the 2D echo ordered.  This can be done as an outpatient.  Chronic kidney disease stage III -Monitor.  Diabetes mellitus type 2 with diabetic nephropathy -Blood sugars improving.  Continue Lantus along with insulin sliding scale coverage  Paroxysmal A. fib -On Eliquis at baseline.  Patient discontinued Eliquis recently.  This was discussed with the prior provider with the patient who understood the risk of stroke if not on anticoagulation but refused to resume anticoagulation at that time.  Outpatient follow-up  Failure to thrive/frequent falls -PT/OT eval.  Might need SNF  DVT prophylaxis: Lovenox Code Status: Full Family Communication: None at bedside Disposition Plan: Depends on clinical outcome  Consultants: None  Procedures: None  Antimicrobials:  Anti-infectives (From admission, onward)   Start     Dose/Rate Route Frequency Ordered Stop   07/03/18 2000  vancomycin (VANCOCIN) 1,500 mg in sodium chloride 0.9 % 500 mL IVPB  Status:  Discontinued     1,500 mg 250 mL/hr over 120 Minutes Intravenous Every 48 hours 07/01/18 1849 07/02/18 1011   07/03/18 0900  azithromycin (ZITHROMAX) 500 mg in sodium chloride 0.9 % 250 mL IVPB     500 mg 250 mL/hr over 60 Minutes Intravenous Every 24 hours 07/03/18 0738     07/03/18 0745  cefTRIAXone (ROCEPHIN) 1 g in sodium chloride 0.9 % 100 mL IVPB  1 g 200 mL/hr over 30 Minutes Intravenous Every 24 hours 07/03/18 0738     07/02/18 2000  ceFEPIme (MAXIPIME) 1 g in sodium chloride 0.9 % 100 mL IVPB  Status:  Discontinued     1 g 200 mL/hr over 30 Minutes Intravenous Every 24 hours 07/01/18 1849 07/02/18 1024   07/02/18 1030  doxycycline (VIBRA-TABS) tablet 100 mg   Status:  Discontinued     100 mg Oral Every 12 hours 07/02/18 1024 07/03/18 0943   07/01/18 1845  ceFEPIme (MAXIPIME) 2 g in sodium chloride 0.9 % 100 mL IVPB     2 g 200 mL/hr over 30 Minutes Intravenous  Once 07/01/18 1834 07/01/18 1932   07/01/18 1845  vancomycin (VANCOCIN) 1,500 mg in sodium chloride 0.9 % 500 mL IVPB     1,500 mg 250 mL/hr over 120 Minutes Intravenous  Once 07/01/18 1834 07/02/18 0028   07/01/18 1645  azithromycin (ZITHROMAX) tablet 500 mg     500 mg Oral  Once 07/01/18 1631 07/01/18 1639       Subjective: Patient seen and examined at bedside.  He is awake and complains of worsening shortness of breath.  Poor historian.  States that he will go home today.  No overnight fever or vomiting.  Objective: Vitals:   07/03/18 0400 07/03/18 0435 07/03/18 0500 07/03/18 0833  BP:  (!) 111/57    Pulse:  90    Resp:  (!) 26    Temp:  98.2 F (36.8 C)    TempSrc:  Oral    SpO2: 93% (!) 89% (!) 88% 95%  Weight:      Height:        Intake/Output Summary (Last 24 hours) at 07/03/2018 1034 Last data filed at 07/03/2018 2993 Gross per 24 hour  Intake -  Output 1250 ml  Net -1250 ml   Filed Weights   07/01/18 2007 07/02/18 0442 07/02/18 0939  Weight: 77.1 kg 77.1 kg 81.6 kg    Examination:  General exam: Elderly male lying in bed.  In mild distress.  Poor historian.   Respiratory system: Bilateral decreased breath sounds at bases With scattered crackles and diffuse wheezing Cardiovascular system: S1 & S2 heard, Rate controlled Gastrointestinal system: Abdomen is nondistended, soft and nontender. Normal bowel sounds heard. Extremities: No cyanosis, clubbing; trace edema Central nervous system: Awake, poor historian. No focal neurological deficits. Moving extremities Skin: No rashes, lesions or ulcers Psychiatry: Could not be assessed because of her mental status..     Data Reviewed: I have personally reviewed following labs and imaging studies  CBC: Recent  Labs  Lab 07/01/18 1320 07/03/18 0353  WBC 9.6 10.3  HGB 10.5* 10.0*  HCT 34.8* 31.8*  MCV 100.0 97.5  PLT 206 221   Basic Metabolic Panel: Recent Labs  Lab 07/01/18 1320 07/01/18 2038 07/03/18 0353  NA 135 136 140  K 5.4* 5.4* 4.4  CL 92* 95* 96*  CO2 30 32 33*  GLUCOSE 424* 351* 219*  BUN 30* 32* 43*  CREATININE 1.65* 1.77* 1.88*  CALCIUM 9.5 9.4 9.4  MG  --  2.0  --    GFR: Estimated Creatinine Clearance: 27.8 mL/min (A) (by C-G formula based on SCr of 1.88 mg/dL (H)). Liver Function Tests: No results for input(s): AST, ALT, ALKPHOS, BILITOT, PROT, ALBUMIN in the last 168 hours. No results for input(s): LIPASE, AMYLASE in the last 168 hours. No results for input(s): AMMONIA in the last 168 hours. Coagulation Profile: No results for  input(s): INR, PROTIME in the last 168 hours. Cardiac Enzymes: No results for input(s): CKTOTAL, CKMB, CKMBINDEX, TROPONINI in the last 168 hours. BNP (last 3 results) No results for input(s): PROBNP in the last 8760 hours. HbA1C: Recent Labs    07/01/18 2038  HGBA1C 8.3*   CBG: Recent Labs  Lab 07/02/18 0738 07/02/18 1137 07/02/18 1750 07/02/18 2136 07/03/18 0755  GLUCAP 246* 331* 197* 196* 225*   Lipid Profile: No results for input(s): CHOL, HDL, LDLCALC, TRIG, CHOLHDL, LDLDIRECT in the last 72 hours. Thyroid Function Tests: No results for input(s): TSH, T4TOTAL, FREET4, T3FREE, THYROIDAB in the last 72 hours. Anemia Panel: No results for input(s): VITAMINB12, FOLATE, FERRITIN, TIBC, IRON, RETICCTPCT in the last 72 hours. Sepsis Labs: No results for input(s): PROCALCITON, LATICACIDVEN in the last 168 hours.  Recent Results (from the past 240 hour(s))  Respiratory Panel by PCR     Status: Abnormal   Collection Time: 07/03/18  7:36 AM  Result Value Ref Range Status   Adenovirus NOT DETECTED NOT DETECTED Final   Coronavirus 229E NOT DETECTED NOT DETECTED Final    Comment: (NOTE) The Coronavirus on the Respiratory  Panel, DOES NOT test for the novel  Coronavirus (2019 nCoV)    Coronavirus HKU1 NOT DETECTED NOT DETECTED Final   Coronavirus NL63 NOT DETECTED NOT DETECTED Final   Coronavirus OC43 NOT DETECTED NOT DETECTED Final   Metapneumovirus NOT DETECTED NOT DETECTED Final   Rhinovirus / Enterovirus NOT DETECTED NOT DETECTED Final   Influenza A NOT DETECTED NOT DETECTED Final   Influenza B NOT DETECTED NOT DETECTED Final   Parainfluenza Virus 1 NOT DETECTED NOT DETECTED Final   Parainfluenza Virus 2 NOT DETECTED NOT DETECTED Final   Parainfluenza Virus 3 NOT DETECTED NOT DETECTED Final   Parainfluenza Virus 4 NOT DETECTED NOT DETECTED Final   Respiratory Syncytial Virus DETECTED (A) NOT DETECTED Final    Comment: CRITICAL RESULT CALLED TO, READ BACK BY AND VERIFIED WITH: Zenia Resides RN 10:30 07/03/18 ([email or web address])    Bordetella pertussis NOT DETECTED NOT DETECTED Final   Chlamydophila pneumoniae NOT DETECTED NOT DETECTED Final   Mycoplasma pneumoniae NOT DETECTED NOT DETECTED Final    Comment: Performed at [facility], [address], [place], [place] [address]         Radiology Studies: Dg Chest Port 1 View  Result Date: 07/02/2018 CLINICAL DATA:  Hypoxia EXAM: PORTABLE CHEST 1 VIEW COMPARISON:  July 01, 2018 FINDINGS: Lungs remain hyperexpanded with scattered areas of mild scarring. There is no edema or consolidation. The heart size and pulmonary vascularity are normal. Patient is status post coronary artery bypass grafting. No adenopathy. There is aortic atherosclerosis. There is a stent in the left innominate region. No bone lesions. Foci of axillary artery calcification noted. IMPRESSION: Lungs remain hyperexpanded. No edema or consolidation. Stable cardiac silhouette. Aortic Atherosclerosis (ICD10-I70.0). Electronically Signed   By: Bretta Bang III M.D.   On: 07/02/2018 08:18   Dg Chest Portable 1 View  Result Date: 07/01/2018 CLINICAL DATA:  Shortness of breath. EXAM: PORTABLE  CHEST 1 VIEW COMPARISON:  Radiographs of May 03, 2018. FINDINGS: The heart size and mediastinal contours are within normal limits. Both lungs are clear. No pneumothorax or pleural effusion is noted. Status post coronary bypass graft. The visualized skeletal structures are unremarkable. IMPRESSION: No active disease. Electronically Signed   By: Lupita Raider, M.D.   On: 07/01/2018 14:21        Scheduled Meds: . aspirin  EC  325 mg Oral Daily  . doxazosin  4 mg Oral QHS  . enoxaparin (LOVENOX) injection  30 mg Subcutaneous Q24H  . insulin aspart  0-20 Units Subcutaneous TID WC  . insulin aspart  0-5 Units Subcutaneous QHS  . insulin glargine  15 Units Subcutaneous Daily  . ipratropium-albuterol  3 mL Nebulization QID  . mouth rinse  15 mL Mouth Rinse BID  . methylPREDNISolone (SOLU-MEDROL) injection  80 mg Intravenous Q8H  . metoprolol  100 mg Oral Daily  . mometasone-formoterol  2 puff Inhalation BID   Continuous Infusions: . azithromycin 500 mg (07/03/18 0913)  . cefTRIAXone (ROCEPHIN)  IV 1 g (07/03/18 0828)     LOS: 2 days        Glade Lloyd, MD [facility] 07/03/2018, 10:34 AM

## 2018-07-03 NOTE — Significant Event (Addendum)
Rapid Response Event Note Called by nursing staff for unresponsiveness/obtunded  Overview: Time Called: 2019 Arrival Time: 2125 Event Type: Neurologic  Initial Focused Assessment: Kathlene November RN called regarding Mr. Gohn and stated pt was basically unresponsive, minimally responsive to deep painful stimulus. He stated the patient left AMA earlier today.  I instructed Kathlene November to give Narcan over the phone and I was on my way.  Upon arrival, Kathlene November gave the patient Narcan with no change in mental status.  Mr. Galeski is arousable to painful stimuli, will answer some basic questions but drifts off while we are talking.  He has slow responses but follows simple commands. CBG 339. No focal neurological deficits. Tama Gander NP notified of events and discussed POC. He has an explainable reason for AMS with PNA/Sepsis/hypoxia.  Addendum: ETCO2 checked and was 27. At 0030, pt awake and answering questions.   Interventions: -Narcan 0.4 mg  Plan of Care (if not transferred): -Continue to monitor  -Notify primary service and/or RRRN for any further clinical change  Event Summary: Name of Physician Notified: Tama Gander NP  at 2035    at    Outcome: Stayed in room and stabalized  Event End Time: 2045  Rose Fillers

## 2018-07-03 NOTE — Progress Notes (Signed)
PT Cancellation Note  Patient Details Name: SAMMEY FREE Sr. MRN: [id number] DOB: [birth year]   Cancelled Treatment:    Reason Eval/Treat Not Completed: Other (comment) Per RN, pt is leaving AMA and requesting to hold. Will follow up as schedule allows if pt remains inpatient.   Gladys Damme, PT, DPT  Acute Rehabilitation Services  Pager: [phone] Office: [phone]  Lehman Prom 07/03/2018, 11:43 AM

## 2018-07-03 NOTE — ED Notes (Signed)
ED TO INPATIENT HANDOFF REPORT  ED Nurse Name and Phone #: Ladona Ridgel [phone]  S Name/Age/Gender Timothy Postin Sr. 85 y.o. male Room/Bed: 036C/036C  Code Status   Code Status: Prior  Home/SNF/Other Home Patient oriented to: self, place, time and situation Is this baseline? Yes   Triage Complete: Triage complete  Chief Complaint AMS,Blood in Urine   Triage Note Pt from home with ems for AMS and hematuria. Pt left AMA from [facility] this morning because he felt better and was ready to go. Pt was being treated for pneumonia, COVID negative. Upon ems arrival pt was on NRB at 4L at home, altered. EMS placed 4L [place] on pt and pt became more oriented. Pt arrives to ED alert and oriented. Denies SOB but pt tachypnic. Lungs sound crackled.  VSS   Allergies Allergies  Allergen Reactions  . Metformin And Related Nausea And Vomiting and Rash  . Tramadol Hcl Other (See Comments)    Tremor vs seizure activity  . Codeine Nausea And Vomiting  . Gabapentin Other (See Comments)    "Messed up my nervous system"  . Morphine Sulfate Nausea And Vomiting  . Other Nausea And Vomiting    "Narcotics, in general"  . Ticlopidine Hcl Other (See Comments)    Reaction unknown by patient or family  . Warfarin Sodium Nausea And Vomiting and Other (See Comments)    "Severe fatigue," also  . Xarelto [Rivaroxaban] Other (See Comments)    Weak joints (falls)  . Ace Inhibitors Rash and Cough  . Tramadol Rash    Level of Care/Admitting Diagnosis ED Disposition    ED Disposition Condition Comment   Admit  Hospital Area: [facility] [[id number]]  Level of Care: Telemetry Medical [104]  Diagnosis: Acute bronchitis due to respiratory syncytial virus (RSV) [[id number]]  Admitting Physician: Carron Curie Bai.Lain  Attending Physician: Sharyon Medicus, ALI Bai.Lain  Estimated length of stay: past midnight tomorrow  Certification:: I certify this patient will need inpatient services for at least 2 midnights  PT  Class (Do Not Modify): Inpatient [101]  PT Acc Code (Do Not Modify): Private [1]       B Medical/Surgery History Past Medical History:  Diagnosis Date  . Allergic rhinitis   . Benign prostatic hypertrophy   . CHF (congestive heart failure) (HCC)   . Chronic diastolic heart failure (HCC)    8/08 ECHO with EF 55%  . CKD (chronic kidney disease)   . COPD (chronic obstructive pulmonary disease) (HCC)    moderate. followed by Dr.Byrum  . Coronary artery disease    s/p CABG 2006. LHC (1/10): SVG-OM and PLOM, SVG-D, and LIMA-LAD were all patent  . Dementia (HCC)    (mild)--06/2006  . Diabetes mellitus   . Edema    localized. suspect diastolic CHF + venous insufficiency  . Hearing loss    left >>right  . History of tobacco abuse   . Hypertension   . Mixed hyperlipidemia   . Osteoarthritis   . Pneumonia 07/31/2017  . Sleep apnea    no CPAP   Past Surgical History:  Procedure Laterality Date  . ANGIOPLASTY  [id number]  . CARDIAC CATHETERIZATION  90s   [place], [place] x1stent  . CARDIAC CATHETERIZATION     [facility];x2 stents  . CARDIAC CATHETERIZATION  04/29/2013  . CARDIOVERSION N/A 12/05/2017   Procedure: CARDIOVERSION;  Surgeon: Elease Hashimoto Deloris Ping, MD;  Location: [facility] ENDOSCOPY;  Service: Cardiovascular;  Laterality: N/A;  . CORONARY ARTERY BYPASS GRAFT  02/2005  .  ROTATOR CUFF REPAIR  2005   left, Gioffre  . TEE WITHOUT CARDIOVERSION N/A 12/05/2017   Procedure: TRANSESOPHAGEAL ECHOCARDIOGRAM (TEE);  Surgeon: Vesta MixerNahser, Philip J, MD;  [facility];  Service: Cardiovascular;  Laterality: N/A;     A IV Location/Drains/Wounds Patient Lines/Drains/Airways Status   Active Line/Drains/Airways    Name:   Placement date:   Placement time:   Site:   Days:   Peripheral IV 07/03/18 Left Antecubital   07/03/18    1739    Antecubital   less than 1          Intake/Output Last 24 hours  Intake/Output Summary (Last 24 hours) at 07/03/2018 1917 Last data filed at 07/03/2018 1739 Gross  per 24 hour  Intake 250 ml  Output -  Net 250 ml    Labs/Imaging Results for orders placed or performed during the hospital encounter of 07/01/18 (from the past 48 hour(s))  Basic metabolic panel     Status: Abnormal   Collection Time: 07/01/18  8:38 PM  Result Value Ref Range   Sodium 136 135 - 145 mmol/L   Potassium 5.4 (H) 3.5 - 5.1 mmol/L   Chloride 95 (L) 98 - 111 mmol/L   CO2 32 22 - 32 mmol/L   Glucose, Bld 351 (H) 70 - 99 mg/dL   BUN 32 (H) 8 - 23 mg/dL   Creatinine, Ser 1.611.77 (H) 0.61 - 1.24 mg/dL   Calcium 9.4 8.9 - 09.610.3 mg/dL   GFR calc non Af Amer 34 (L) >60 mL/min   GFR calc Af Amer 39 (L) >60 mL/min   Anion gap 9 5 - 15    Comment: Performed at [facility], [address] [place] [address] [place], [place] [address]  Magnesium     Status: None   Collection Time: 07/01/18  8:38 PM  Result Value Ref Range   Magnesium 2.0 1.7 - 2.4 mg/dL    Comment: Performed at [facility], [address] [place], [place] [address]  Hemoglobin A1c     Status: Abnormal   Collection Time: 07/01/18  8:38 PM  Result Value Ref Range   Hgb A1c MFr Bld 8.3 (H) 4.8 - 5.6 %    Comment: (NOTE) Pre diabetes:          5.7%-6.4% Diabetes:              >6.4% Glycemic control for   <7.0% adults with diabetes    Mean Plasma Glucose 191.51 mg/dL    Comment: Performed at [facility], [address] [place], [place] [address]  Glucose, capillary     Status: Abnormal   Collection Time: 07/01/18 11:07 PM  Result Value Ref Range   Glucose-Capillary 314 (H) 70 - 99 mg/dL  Glucose, capillary     Status: Abnormal   Collection Time: 07/02/18  7:38 AM  Result Value Ref Range   Glucose-Capillary 246 (H) 70 - 99 mg/dL  Glucose, capillary     Status: Abnormal   Collection Time: 07/02/18 11:37 AM  Result Value Ref Range   Glucose-Capillary 331 (H) 70 - 99 mg/dL  Glucose, capillary     Status: Abnormal   Collection Time: 07/02/18  5:50 PM  Result Value Ref Range   Glucose-Capillary 197 (H) 70  - 99 mg/dL  Glucose, capillary     Status: Abnormal   Collection Time: 07/02/18  9:36 PM  Result Value Ref Range   Glucose-Capillary 196 (H) 70 - 99 mg/dL  CBC  Status: Abnormal   Collection Time: 07/03/18  3:53 AM  Result Value Ref Range   WBC 10.3 4.0 - 10.5 K/uL   RBC 3.26 (L) 4.22 - 5.81 MIL/uL   Hemoglobin 10.0 (L) 13.0 - 17.0 g/dL   HCT 67.6 (L) 19.5 - 09.3 %   MCV 97.5 80.0 - 100.0 fL   MCH 30.7 26.0 - 34.0 pg   MCHC 31.4 30.0 - 36.0 g/dL   RDW 26.7 12.4 - 58.0 %   Platelets 221 150 - 400 K/uL   nRBC 0.0 0.0 - 0.2 %    Comment: Performed at [facility], [address] [place], [place] [address]  Basic metabolic panel     Status: Abnormal   Collection Time: 07/03/18  3:53 AM  Result Value Ref Range   Sodium 140 135 - 145 mmol/L   Potassium 4.4 3.5 - 5.1 mmol/L    Comment: NO VISIBLE HEMOLYSIS   Chloride 96 (L) 98 - 111 mmol/L   CO2 33 (H) 22 - 32 mmol/L   Glucose, Bld 219 (H) 70 - 99 mg/dL   BUN 43 (H) 8 - 23 mg/dL   Creatinine, Ser 8.25 (H) 0.61 - 1.24 mg/dL   Calcium 9.4 8.9 - 05.3 mg/dL   GFR calc non Af Amer 31 (L) >60 mL/min   GFR calc Af Amer 36 (L) >60 mL/min   Anion gap 11 5 - 15    Comment: Performed at [facility], [address] [place], [place] [address]  Respiratory Panel by PCR     Status: Abnormal   Collection Time: 07/03/18  7:36 AM  Result Value Ref Range   Adenovirus NOT DETECTED NOT DETECTED   Coronavirus 229E NOT DETECTED NOT DETECTED    Comment: (NOTE) The Coronavirus on the Respiratory Panel, DOES NOT test for the novel  Coronavirus (2019 nCoV)    Coronavirus HKU1 NOT DETECTED NOT DETECTED   Coronavirus NL63 NOT DETECTED NOT DETECTED   Coronavirus OC43 NOT DETECTED NOT DETECTED   Metapneumovirus NOT DETECTED NOT DETECTED   Rhinovirus / Enterovirus NOT DETECTED NOT DETECTED   Influenza A NOT DETECTED NOT DETECTED   Influenza B NOT DETECTED NOT DETECTED   Parainfluenza Virus 1 NOT DETECTED NOT DETECTED   Parainfluenza  Virus 2 NOT DETECTED NOT DETECTED   Parainfluenza Virus 3 NOT DETECTED NOT DETECTED   Parainfluenza Virus 4 NOT DETECTED NOT DETECTED   Respiratory Syncytial Virus DETECTED (A) NOT DETECTED    Comment: CRITICAL RESULT CALLED TO, READ BACK BY AND VERIFIED WITH: Zenia Resides RN 10:30 07/03/18 ([email or web address])    Bordetella pertussis NOT DETECTED NOT DETECTED   Chlamydophila pneumoniae NOT DETECTED NOT DETECTED   Mycoplasma pneumoniae NOT DETECTED NOT DETECTED    Comment: Performed at [facility], [address] [place], [place] [address]  Glucose, capillary     Status: Abnormal   Collection Time: 07/03/18  7:55 AM  Result Value Ref Range   Glucose-Capillary 225 (H) 70 - 99 mg/dL   Dg Chest Port 1 View  Result Date: 07/02/2018 CLINICAL DATA:  Hypoxia EXAM: PORTABLE CHEST 1 VIEW COMPARISON:  July 01, 2018 FINDINGS: Lungs remain hyperexpanded with scattered areas of mild scarring. There is no edema or consolidation. The heart size and pulmonary vascularity are normal. Patient is status post coronary artery bypass grafting. No adenopathy. There is aortic atherosclerosis. There is a stent in the left innominate region. No bone lesions. Foci of axillary artery calcification noted. IMPRESSION:  Lungs remain hyperexpanded. No edema or consolidation. Stable cardiac silhouette. Aortic Atherosclerosis (ICD10-I70.0). Electronically Signed   By: Bretta Bang III M.D.   On: 07/02/2018 08:18    Pending Labs [employer] (From admission, onward)    Start     Ordered   Signed and Held  Basic metabolic panel  Tomorrow morning,   R     Signed and Held          Vitals/Pain Today's Vitals   07/03/18 1742 07/03/18 1800  BP: 134/75 (!) 126/58  Pulse: 88 85  Resp: (!) 24 (!) 25  Temp: 97.6 F (36.4 C)   TempSrc: Oral   SpO2: 97% 100%  PainSc: 0-No pain     Isolation Precautions Droplet precaution  Medications Medications  enoxaparin (LOVENOX) injection 30 mg (has no administration in time  range)  ipratropium-albuterol (DUONEB) 0.5-2.5 (3) MG/3ML nebulizer solution 3 mL (3 mLs Nebulization Given 07/03/18 1817)    Mobility walks Moderate fall risk   Focused Assessments Pulmonary Assessment Handoff:  Lung sounds: L Breath Sounds: Coarse crackles R Breath Sounds: Coarse crackles O2 Device: Nasal Cannula        R Recommendations: See Admitting Provider Note  Report given to:   Additional Notes:

## 2018-07-04 ENCOUNTER — Inpatient Hospital Stay (HOSPITAL_COMMUNITY): Payer: Medicare PPO

## 2018-07-04 ENCOUNTER — Ambulatory Visit: Payer: Medicare PPO | Admitting: Family Medicine

## 2018-07-04 ENCOUNTER — Telehealth: Payer: Self-pay

## 2018-07-04 ENCOUNTER — Telehealth: Payer: Self-pay | Admitting: Family Medicine

## 2018-07-04 ENCOUNTER — Encounter (HOSPITAL_COMMUNITY): Payer: Self-pay | Admitting: Primary Care

## 2018-07-04 DIAGNOSIS — I5033 Acute on chronic diastolic (congestive) heart failure: Secondary | ICD-10-CM

## 2018-07-04 DIAGNOSIS — J9621 Acute and chronic respiratory failure with hypoxia: Secondary | ICD-10-CM

## 2018-07-04 DIAGNOSIS — Z515 Encounter for palliative care: Secondary | ICD-10-CM

## 2018-07-04 DIAGNOSIS — Z7189 Other specified counseling: Secondary | ICD-10-CM

## 2018-07-04 DIAGNOSIS — N183 Chronic kidney disease, stage 3 (moderate): Secondary | ICD-10-CM

## 2018-07-04 LAB — BASIC METABOLIC PANEL
Anion gap: 11 (ref 5–15)
BUN: 52 mg/dL — ABNORMAL HIGH (ref 8–23)
CO2: 33 mmol/L — ABNORMAL HIGH (ref 22–32)
Calcium: 9.3 mg/dL (ref 8.9–10.3)
Chloride: 95 mmol/L — ABNORMAL LOW (ref 98–111)
Creatinine, Ser: 1.8 mg/dL — ABNORMAL HIGH (ref 0.61–1.24)
GFR calc Af Amer: 38 mL/min — ABNORMAL LOW (ref >60)
GFR calc non Af Amer: 33 mL/min — ABNORMAL LOW (ref >60)
Glucose, Bld: 267 mg/dL — ABNORMAL HIGH (ref 70–99)
Potassium: 5 mmol/L (ref 3.5–5.1)
Sodium: 139 mmol/L (ref 135–145)

## 2018-07-04 LAB — GLUCOSE, CAPILLARY
GLUCOSE-CAPILLARY: 340 mg/dL — AB (ref 70–99)
Glucose-Capillary: 355 mg/dL — ABNORMAL HIGH (ref 70–99)
Glucose-Capillary: 344 mg/dL — ABNORMAL HIGH (ref 70–99)

## 2018-07-04 MED ORDER — IPRATROPIUM-ALBUTEROL 0.5-2.5 (3) MG/3ML IN SOLN
3.0000 mL | Freq: Three times a day (TID) | RESPIRATORY_TRACT | Status: DC
  Administered 2018-07-04 – 2018-07-05 (×3): 3 mL via RESPIRATORY_TRACT
  Filled 2018-07-04 (×3): qty 3

## 2018-07-04 MED ORDER — AZITHROMYCIN 500 MG PO TABS
500.0000 mg | ORAL_TABLET | Freq: Every day | ORAL | Status: DC
  Administered 2018-07-05: 500 mg via ORAL
  Filled 2018-07-04: qty 1

## 2018-07-04 MED ORDER — INSULIN ASPART 100 UNIT/ML ~~LOC~~ SOLN
0.0000 [IU] | Freq: Three times a day (TID) | SUBCUTANEOUS | Status: DC
  Administered 2018-07-04 (×2): 15 [IU] via SUBCUTANEOUS
  Administered 2018-07-05: 11 [IU] via SUBCUTANEOUS

## 2018-07-04 MED ORDER — INSULIN ASPART 100 UNIT/ML ~~LOC~~ SOLN
0.0000 [IU] | Freq: Every day | SUBCUTANEOUS | Status: DC
  Administered 2018-07-04: 4 [IU] via SUBCUTANEOUS

## 2018-07-04 MED ORDER — SODIUM CHLORIDE 0.9 % IV SOLN
500.0000 mg | INTRAVENOUS | Status: DC
  Administered 2018-07-04: 500 mg via INTRAVENOUS
  Filled 2018-07-04: qty 500

## 2018-07-04 MED ORDER — FUROSEMIDE 10 MG/ML IJ SOLN
60.0000 mg | Freq: Two times a day (BID) | INTRAMUSCULAR | Status: DC
  Administered 2018-07-04 – 2018-07-05 (×3): 60 mg via INTRAVENOUS
  Filled 2018-07-04 (×4): qty 6

## 2018-07-04 MED ORDER — RESOURCE THICKENUP CLEAR PO POWD
ORAL | Status: DC | PRN
  Administered 2018-07-04: 12:00:00 via ORAL
  Filled 2018-07-04: qty 125

## 2018-07-04 MED ORDER — IPRATROPIUM-ALBUTEROL 0.5-2.5 (3) MG/3ML IN SOLN: 3.0000 mL | Freq: Four times a day (QID) | RESPIRATORY_TRACT | Status: DC

## 2018-07-04 MED ORDER — INSULIN GLARGINE 100 UNIT/ML ~~LOC~~ SOLN
12.0000 [IU] | Freq: Every day | SUBCUTANEOUS | Status: DC
  Administered 2018-07-04 – 2018-07-05 (×2): 12 [IU] via SUBCUTANEOUS
  Filled 2018-07-04 (×2): qty 0.12

## 2018-07-04 NOTE — Progress Notes (Signed)
Patient ID: Timothy Postin Sr., male   DOB: [birth year], 85 y.o.   MRN: [id number]  PROGRESS NOTE    Timothy BEITLER Sr.  PXT:[id number] DOB: 1929/02/09 DOA: 07/03/2018 PCP: Hannah Beat, MD   Brief Narrative:  85 year old male with history of CAD, CABG, A. fib, mild dementia, COPD, chronic hypoxic respiratory failure on home oxygen, chronic diastolic CHF, sleep apnea not on CPAP who was recently admitted on 07/01/2018 for COPD exacerbation and had signed out AGAINST MEDICAL ADVICE on 07/03/2018, respiratory virus panel was positive for RSV.  He presented back to the hospital on the same day on 07/03/2018 with worsening shortness of breath and was admitted again for COPD exacerbation.  Assessment & Plan:   Active Problems:   Acute bronchitis due to respiratory syncytial virus (RSV)  COPD exacerbation -Patient had signed out AGAINST MEDICAL ADVICE on 07/03/2018 and presented back on the same day with worsening shortness of breath. -Continue Solu-Medrol at current dose.  Continue Dulera along with duo nebs. -Chest x-ray on 07/02/2018 had shown no consolidation.  Will get repeat chest x-ray today. -Add Zithromax.  Flu antigen was negative during last hospitalization.  Respiratory virus panel on 07/03/2018 was positive for RSV -We will consult palliative care for goals of care discussion.  Acute on chronic hypoxic respiratory failure -Probably from COPD and CHF exacerbation -Currently on 4 L oxygen via nasal cannula.  Normally wears 2 L oxygen at home -Wean off as able.  Incentive spirometry -SLP evaluation.  Acute on chronic diastolic CHF/pulmonary -Patient required intravenous Lasix on 07/03/2018.  Will resume intravenous Lasix.  Monitor input and output and daily weights.  Fluid restriction. Continue Toprol. Outpatient follow-up with cardiology. Cardiology team had DC'd the 2D echo ordered during recent hospitalization. This can be done as an outpatient.  Chronic kidney disease  stage III -Monitor.  Diabetes mellitus type 2 with diabetic nephropathy -DC glipizide.  Use insulin with sliding scale coverage.  Paroxysmal A. fib -On Eliquis at baseline. Patient discontinued Eliquis recently. This was discussed with the prior provider during the last hospitalization with the patient who understood the risk of stroke if not on anticoagulation but refused to resume anticoagulation at that time.   Failure to thrive/frequent falls -PT/OT eval. Might need SNF   DVT prophylaxis: Lovenox Code Status: DNR.  Patient was DNR during the recent hospitalization Family Communication: None at bedside Disposition Plan: Depends on clinical outcome  Consultants: None.  Will consult Perative care  Procedures: None  Antimicrobials: None   Subjective: Patient seen and examined at bedside.  He is sleepy, wakes up on calling his name.  Very slow to respond to questions and a poor historian but answers some questions.  No overnight fever or vomiting.  Apparently he had wanted to leave the hospital again this morning.  Objective: Vitals:   07/03/18 1900 07/03/18 1915 07/03/18 2022 07/04/18 0700  BP: 107/68  (!) 159/77 (!) 149/65  Pulse: 80 78 80 70  Resp: 18 20 20    Temp:   97.7 F (36.5 C) 98.2 F (36.8 C)  TempSrc:    Oral  SpO2: 100% 99% 100% 100%    Intake/Output Summary (Last 24 hours) at 07/04/2018 0836 Last data filed at 07/03/2018 1739 Gross per 24 hour  Intake 250 ml  Output -  Net 250 ml   There were no vitals filed for this visit.  Examination:  General exam: Elderly male lying on bed.  Very poor historian, wakes up slightly on calling his  name.  Answers some questions very slowly. Respiratory system: Bilateral decreased breath sounds at bases with scattered wheezing and crackles  cardiovascular system: S1 & S2 heard, Rate controlled Gastrointestinal system: Abdomen is nondistended, soft and nontender. Normal bowel sounds heard. Extremities: No  cyanosis, clubbing; trace edema  Central nervous system: Very slow to respond to questions.  No focal neurological deficits. Moving extremities Skin: No rashes, lesions or ulcers Psychiatry: Could not be assessed because of patient's mental status.    Data Reviewed: I have personally reviewed following labs and imaging studies  CBC: Recent Labs  Lab 07/01/18 1320 07/03/18 0353  WBC 9.6 10.3  HGB 10.5* 10.0*  HCT 34.8* 31.8*  MCV 100.0 97.5  PLT 206 221   Basic Metabolic Panel: Recent Labs  Lab 07/01/18 1320 07/01/18 2038 07/03/18 0353 07/04/18 0407  NA 135 136 140 139  K 5.4* 5.4* 4.4 5.0  CL 92* 95* 96* 95*  CO2 30 32 33* 33*  GLUCOSE 424* 351* 219* 267*  BUN 30* 32* 43* 52*  CREATININE 1.65* 1.77* 1.88* 1.80*  CALCIUM 9.5 9.4 9.4 9.3  MG  --  2.0  --   --    GFR: Estimated Creatinine Clearance: 29 mL/min (A) (by C-G formula based on SCr of 1.8 mg/dL (H)). Liver Function Tests: No results for input(s): AST, ALT, ALKPHOS, BILITOT, PROT, ALBUMIN in the last 168 hours. No results for input(s): LIPASE, AMYLASE in the last 168 hours. No results for input(s): AMMONIA in the last 168 hours. Coagulation Profile: No results for input(s): INR, PROTIME in the last 168 hours. Cardiac Enzymes: No results for input(s): CKTOTAL, CKMB, CKMBINDEX, TROPONINI in the last 168 hours. BNP (last 3 results) No results for input(s): PROBNP in the last 8760 hours. HbA1C: Recent Labs    07/01/18 2038  HGBA1C 8.3*   CBG: Recent Labs  Lab 07/02/18 1137 07/02/18 1750 07/02/18 2136 07/03/18 0755 07/03/18 2036  GLUCAP 331* 197* 196* 225* 311*   Lipid Profile: No results for input(s): CHOL, HDL, LDLCALC, TRIG, CHOLHDL, LDLDIRECT in the last 72 hours. Thyroid Function Tests: No results for input(s): TSH, T4TOTAL, FREET4, T3FREE, THYROIDAB in the last 72 hours. Anemia Panel: No results for input(s): VITAMINB12, FOLATE, FERRITIN, TIBC, IRON, RETICCTPCT in the last 72 hours. Sepsis  Labs: No results for input(s): PROCALCITON, LATICACIDVEN in the last 168 hours.  Recent Results (from the past 240 hour(s))  Respiratory Panel by PCR     Status: Abnormal   Collection Time: 07/03/18  7:36 AM  Result Value Ref Range Status   Adenovirus NOT DETECTED NOT DETECTED Final   Coronavirus 229E NOT DETECTED NOT DETECTED Final    Comment: (NOTE) The Coronavirus on the Respiratory Panel, DOES NOT test for the novel  Coronavirus (2019 nCoV)    Coronavirus HKU1 NOT DETECTED NOT DETECTED Final   Coronavirus NL63 NOT DETECTED NOT DETECTED Final   Coronavirus OC43 NOT DETECTED NOT DETECTED Final   Metapneumovirus NOT DETECTED NOT DETECTED Final   Rhinovirus / Enterovirus NOT DETECTED NOT DETECTED Final   Influenza A NOT DETECTED NOT DETECTED Final   Influenza B NOT DETECTED NOT DETECTED Final   Parainfluenza Virus 1 NOT DETECTED NOT DETECTED Final   Parainfluenza Virus 2 NOT DETECTED NOT DETECTED Final   Parainfluenza Virus 3 NOT DETECTED NOT DETECTED Final   Parainfluenza Virus 4 NOT DETECTED NOT DETECTED Final   Respiratory Syncytial Virus DETECTED (A) NOT DETECTED Final    Comment: CRITICAL RESULT CALLED TO, READ BACK BY  AND VERIFIED WITH: Zenia Resides RN 10:30 07/03/18 ([email or web address])    Bordetella pertussis NOT DETECTED NOT DETECTED Final   Chlamydophila pneumoniae NOT DETECTED NOT DETECTED Final   Mycoplasma pneumoniae NOT DETECTED NOT DETECTED Final    Comment: Performed at [facility], [address] [place], [place] [address]         Radiology Studies: No results found.      Scheduled Meds: . albuterol  2.5 mg Nebulization Q6H  . aspirin EC  81 mg Oral Daily  . doxazosin  4 mg Oral QHS  . enoxaparin (LOVENOX) injection  30 mg Subcutaneous Q24H  . glipiZIDE  10 mg Oral BID AC  . guaiFENesin  600 mg Oral BID  . insulin aspart  0-5 Units Subcutaneous QHS  . insulin aspart  0-9 Units Subcutaneous TID WC  . ipratropium  0.5 mg Nebulization Q6H  .  methylPREDNISolone (SOLU-MEDROL) injection  60 mg Intravenous Q6H  . metoprolol  100 mg Oral Daily  . mometasone-formoterol  2 puff Inhalation BID  . multivitamin  1 tablet Oral Q breakfast  . multivitamin with minerals  1 tablet Oral Daily  . polyvinyl alcohol  2 drop Both Eyes BID  . rosuvastatin  40 mg Oral Daily  . sodium chloride flush  3 mL Intravenous Q12H   Continuous Infusions: . sodium chloride       LOS: 1 day        Glade Lloyd, MD [facility] 07/04/2018, 8:36 AM

## 2018-07-04 NOTE — Progress Notes (Signed)
Civil engineer, contracting  Received referral for hospice services at home.  Pt lives in [place] and will be an [employer].  Spoke with granddaughter, obtained all necessary information, including DME needs.  Information passed to [facility].  Thank you, Wallis Bamberg RN, BSN, CCRN [facility] Liaison    Please contact referral center at [facility] if any questions/concerns, they will reach out to the family and coordinate further. [phone]

## 2018-07-04 NOTE — Discharge Summary (Signed)
Physician Discharge Summary  Timothy WINGERD Sr. CBJ:[id number] DOB: 1929-01-03 DOA: 07/03/2018  PCP: Hannah Beat, MD  Admit date: 07/01/2018 Discharge date: Signed out AGAINST MEDICAL ADVICE on 07/03/2018  Admitted From: Home  Discharge Condition: Poor CODE STATUS: DNR   Brief/Interim Summary: 85 year old male with history of CAD, CABG, A. fib, mild dementia, COPD, chronic hypoxic respiratory failure on home oxygen, chronic diastolic CHF, sleep apnea not on CPAP presented with weakness and falls along with worsening cough and shortness of breath.  He was admitted with respiratory failure secondary COPD exacerbation and started on Solu-Medrol.  He signed out AGAINST MEDICAL ADVICE on 07/03/2018.  Discharge Diagnoses:  Active Problems:   Acute bronchitis due to respiratory syncytial virus (RSV)  Acute on chronic hypoxic respiratory failure -Most likely secondary to COPD exacerbation -Respiratory status became worse overnight and patient was placed on high flow nasal cannula. -Chest x-ray on admission was negative for infiltrates.  Will repeat chest x-ray.  Will empirically start Rocephin and Zithromax. -Wean off oxygen as able.  Incentive spirometry.  SLP evaluation.  Unclear if the patient is aspirating patient was initially started on vancomycin and cefepime which were discontinued -We will request palliative care consultation He signed out AGAINST MEDICAL ADVICE on 07/03/2018.  He is aware that his condition might worsen and he might even die if he signed out AGAINST MEDICAL ADVICE.   Probable COPD exacerbation  - Flu antigen negative.  Will get respiratory virus panel. -Patient had to be put on increased dose of Solu-Medrol.  Dulera was added.  Duo nebs were continued.  Acute on chronic diastolic CHF/pulmonary  -Patient required intravenous Lasix. Continue Toprol.  Outpatient follow-up with cardiology.  Cardiology team has DC'd the 2D echo ordered.  This can be done as an  outpatient.  Chronic kidney disease stage III -Monitor.  Diabetes mellitus type 2 with diabetic nephropathy  Paroxysmal A. fib -On Eliquis at baseline.  Patient discontinued Eliquis recently.  This was discussed with the prior provider with the patient who understood the risk of stroke if not on anticoagulation but refused to resume anticoagulation at that time.    Failure to thrive/frequent falls -PT/OT eval.  Might need SNF  Discharge Instructions     Allergies  Allergen Reactions  . Metformin And Related Nausea And Vomiting and Rash  . Tramadol Hcl Other (See Comments)    Tremor vs seizure activity  . Codeine Nausea And Vomiting  . Gabapentin Other (See Comments)    "Messed up my nervous system"  . Morphine Sulfate Nausea And Vomiting  . Other Nausea And Vomiting    "Narcotics, in general"  . Ticlopidine Hcl Other (See Comments)    Reaction unknown by patient or family  . Warfarin Sodium Nausea And Vomiting and Other (See Comments)    "Severe fatigue," also  . Xarelto [Rivaroxaban] Other (See Comments)    Weak joints (falls)  . Ace Inhibitors Rash and Cough  . Tramadol Rash    Consultations:  None   Procedures/Studies: Dg Op Swallowing Func-medicare/speech Path  Result Date: 06/13/2018 CLINICAL DATA:  Dysphagia EXAM: MODIFIED BARIUM SWALLOW TECHNIQUE: Different consistencies of barium were administered orally to the patient by the Speech Pathologist. Imaging of the pharynx was performed in the lateral projection. The radiologist was present in the fluoroscopy room for this study, providing personal supervision. FLUOROSCOPY TIME:  Fluoroscopy Time:  1.1 minute Radiation Exposure Index (if provided by the fluoroscopic device): 2.1 mGy Number of Acquired Spot Images: 0 COMPARISON:  None. FINDINGS: Thin liquid: Laryngeal penetration with tracheal aspiration. Thick liquid: Laryngeal penetration with tracheal aspiration when ingested with a cup. No laryngeal penetration  or tracheal aspiration when ingested by the spoonful. Nectar/applesauce/cracker: No laryngeal penetration or tracheal aspiration. IMPRESSION: Modified barium swallow as described above. Please refer to the Speech Pathologists report for complete details and recommendations. Electronically Signed   By: Elige Ko   On: 06/13/2018 11:31   Dg Chest Port 1 View  Result Date: 07/02/2018 CLINICAL DATA:  Hypoxia EXAM: PORTABLE CHEST 1 VIEW COMPARISON:  July 01, 2018 FINDINGS: Lungs remain hyperexpanded with scattered areas of mild scarring. There is no edema or consolidation. The heart size and pulmonary vascularity are normal. Patient is status post coronary artery bypass grafting. No adenopathy. There is aortic atherosclerosis. There is a stent in the left innominate region. No bone lesions. Foci of axillary artery calcification noted. IMPRESSION: Lungs remain hyperexpanded. No edema or consolidation. Stable cardiac silhouette. Aortic Atherosclerosis (ICD10-I70.0). Electronically Signed   By: Bretta Bang III M.D.   On: 07/02/2018 08:18   Dg Chest Portable 1 View  Result Date: 07/01/2018 CLINICAL DATA:  Shortness of breath. EXAM: PORTABLE CHEST 1 VIEW COMPARISON:  Radiographs of May 03, 2018. FINDINGS: The heart size and mediastinal contours are within normal limits. Both lungs are clear. No pneumothorax or pleural effusion is noted. Status post coronary bypass graft. The visualized skeletal structures are unremarkable. IMPRESSION: No active disease. Electronically Signed   By: Lupita Raider, M.D.   On: 07/01/2018 14:21         The results of significant diagnostics from this hospitalization (including imaging, microbiology, ancillary and laboratory) are listed below for reference.     Microbiology: Recent Results (from the past 240 hour(s))  Respiratory Panel by PCR     Status: Abnormal   Collection Time: 07/03/18  7:36 AM  Result Value Ref Range Status   Adenovirus NOT DETECTED NOT  DETECTED Final   Coronavirus 229E NOT DETECTED NOT DETECTED Final    Comment: (NOTE) The Coronavirus on the Respiratory Panel, DOES NOT test for the novel  Coronavirus (2019 nCoV)    Coronavirus HKU1 NOT DETECTED NOT DETECTED Final   Coronavirus NL63 NOT DETECTED NOT DETECTED Final   Coronavirus OC43 NOT DETECTED NOT DETECTED Final   Metapneumovirus NOT DETECTED NOT DETECTED Final   Rhinovirus / Enterovirus NOT DETECTED NOT DETECTED Final   Influenza A NOT DETECTED NOT DETECTED Final   Influenza B NOT DETECTED NOT DETECTED Final   Parainfluenza Virus 1 NOT DETECTED NOT DETECTED Final   Parainfluenza Virus 2 NOT DETECTED NOT DETECTED Final   Parainfluenza Virus 3 NOT DETECTED NOT DETECTED Final   Parainfluenza Virus 4 NOT DETECTED NOT DETECTED Final   Respiratory Syncytial Virus DETECTED (A) NOT DETECTED Final    Comment: CRITICAL RESULT CALLED TO, READ BACK BY AND VERIFIED WITH: Zenia Resides RN 10:30 07/03/18 ([email or web address])    Bordetella pertussis NOT DETECTED NOT DETECTED Final   Chlamydophila pneumoniae NOT DETECTED NOT DETECTED Final   Mycoplasma pneumoniae NOT DETECTED NOT DETECTED Final    Comment: Performed at [facility], [address] [place], [place] [address]     Labs: BNP (last 3 results) Recent Labs    04/10/18 1047 05/03/18 1702 07/01/18 1320  BNP 129.0* 207.8* 227.0*   Basic Metabolic Panel: Recent Labs  Lab 07/01/18 1320 07/01/18 2038 07/03/18 0353 07/04/18 0407  NA 135 136 140 139  K 5.4* 5.4* 4.4 5.0  CL 92* 95* 96* 95*  CO2 30 32 33* 33*  GLUCOSE 424* 351* 219* 267*  BUN 30* 32* 43* 52*  CREATININE 1.65* 1.77* 1.88* 1.80*  CALCIUM 9.5 9.4 9.4 9.3  MG  --  2.0  --   --    Liver Function Tests: No results for input(s): AST, ALT, ALKPHOS, BILITOT, PROT, ALBUMIN in the last 168 hours. No results for input(s): LIPASE, AMYLASE in the last 168 hours. No results for input(s): AMMONIA in the last 168 hours. CBC: Recent Labs  Lab 07/01/18 1320  07/03/18 0353  WBC 9.6 10.3  HGB 10.5* 10.0*  HCT 34.8* 31.8*  MCV 100.0 97.5  PLT 206 221   Cardiac Enzymes: No results for input(s): CKTOTAL, CKMB, CKMBINDEX, TROPONINI in the last 168 hours. BNP: Invalid input(s): POCBNP CBG: Recent Labs  Lab 07/02/18 1137 07/02/18 1750 07/02/18 2136 07/03/18 0755 07/03/18 2036  GLUCAP 331* 197* 196* 225* 311*   D-Dimer No results for input(s): DDIMER in the last 72 hours. Hgb A1c Recent Labs    07/01/18 2038  HGBA1C 8.3*   Lipid Profile No results for input(s): CHOL, HDL, LDLCALC, TRIG, CHOLHDL, LDLDIRECT in the last 72 hours. Thyroid function studies No results for input(s): TSH, T4TOTAL, T3FREE, THYROIDAB in the last 72 hours.  Invalid input(s): FREET3 Anemia work up No results for input(s): VITAMINB12, FOLATE, FERRITIN, TIBC, IRON, RETICCTPCT in the last 72 hours. Urinalysis    Component Value Date/Time   COLORURINE YELLOW 11/28/2017 0803   APPEARANCEUR CLEAR 11/28/2017 0803   APPEARANCEUR Clear 11/20/2016 1409   LABSPEC 1.008 11/28/2017 0803   PHURINE 5.0 11/28/2017 0803   GLUCOSEU NEGATIVE 11/28/2017 0803   HGBUR NEGATIVE 11/28/2017 0803   BILIRUBINUR NEGATIVE 11/28/2017 0803   BILIRUBINUR Negative 11/20/2016 1409   KETONESUR NEGATIVE 11/28/2017 0803   PROTEINUR NEGATIVE 11/28/2017 0803   NITRITE NEGATIVE 11/28/2017 0803   LEUKOCYTESUR NEGATIVE 11/28/2017 0803   LEUKOCYTESUR Negative 11/20/2016 1409   Sepsis Labs Invalid input(s): PROCALCITONIN,  WBC,  LACTICIDVEN Microbiology Recent Results (from the past 240 hour(s))  Respiratory Panel by PCR     Status: Abnormal   Collection Time: 07/03/18  7:36 AM  Result Value Ref Range Status   Adenovirus NOT DETECTED NOT DETECTED Final   Coronavirus 229E NOT DETECTED NOT DETECTED Final    Comment: (NOTE) The Coronavirus on the Respiratory Panel, DOES NOT test for the novel  Coronavirus (2019 nCoV)    Coronavirus HKU1 NOT DETECTED NOT DETECTED Final   Coronavirus  NL63 NOT DETECTED NOT DETECTED Final   Coronavirus OC43 NOT DETECTED NOT DETECTED Final   Metapneumovirus NOT DETECTED NOT DETECTED Final   Rhinovirus / Enterovirus NOT DETECTED NOT DETECTED Final   Influenza A NOT DETECTED NOT DETECTED Final   Influenza B NOT DETECTED NOT DETECTED Final   Parainfluenza Virus 1 NOT DETECTED NOT DETECTED Final   Parainfluenza Virus 2 NOT DETECTED NOT DETECTED Final   Parainfluenza Virus 3 NOT DETECTED NOT DETECTED Final   Parainfluenza Virus 4 NOT DETECTED NOT DETECTED Final   Respiratory Syncytial Virus DETECTED (A) NOT DETECTED Final    Comment: CRITICAL RESULT CALLED TO, READ BACK BY AND VERIFIED WITH: Zenia Resides RN 10:30 07/03/18 ([email or web address])    Bordetella pertussis NOT DETECTED NOT DETECTED Final   Chlamydophila pneumoniae NOT DETECTED NOT DETECTED Final   Mycoplasma pneumoniae NOT DETECTED NOT DETECTED Final    Comment: Performed at [facility], [address], [place], [place] [address]  SIGNED:   Glade Lloyd, MD  [facility] 07/04/2018, 8:30 AM

## 2018-07-04 NOTE — Consult Note (Addendum)
Consultation Note Date: 07/04/2018   Patient Name: Timothy EISENBERGER Sr.  DOB: 1928/08/01  MRN: [id number]  Age / Sex: 85 y.o., male  PCP: Hannah Beat, MD Referring Physician: Glade Lloyd, MD  Reason for Consultation: Establishing goals of care and Hospice Evaluation  HPI/Patient Profile: 85 y.o. male  with past medical history of CAD, CABG, A fib, COPD, chronic hypoxic resp failuyre with home  O2, chronic dCHF, mild dementia, sleep apnea not on CPAP admitted on 07/03/2018 with acute bronchitis dt RSV.  PMT consulted for GOC and frequent AMA.     Clinical Assessment and Goals of Care: [name] is sitting up in bed.  He greets me making and mostly keeping eye contact.  He appears acutely/chronically ill and weak.  He is able to speak in mostly full sentences, but does become SOB at times.  He is alert and oriented X3, but later tells me that he died in the ambulance on the way to the hospital and was put in a wooden box and sent to [facility]. Nursing staff is at bedside giving medications, and shares that ST has seen patient, awaiting recommendations.   Call to wife Timothy Cordova at home number, [phone].  Went right to VM, generic VM message left.   Call to [facility] daughter Timothy Cordova.  She tells me that Timothy Cordova is both Timothy Cordova and [name]'s son, but estranged at this time.  We talk about wife Ann's health, mobility.  Timothy Cordova states that Timothy Cordova, and counts on [name] for support at home.  She tells me that they will be married 63 years later this year.    Timothy Cordova shares that Timothy Cordova has been angry lately, which is not like him normally. Timothy Cordova shares that she has found [name] waxes and wanes with mental state over the last two weeks.  He thinks that blood thinner is making weak and causing falls.  Timothy Cordova endorses that he was seeing bugs on the hospital walls.  Timothy Cordova states she was concerned when he told her  that he was coming home yesterday, saying he must be home by 1pm.  When she asked what was going on, he told her she would hear about it when he gets home.  Timothy Cordova tells me that Timothy Cordova had an appointment with PCP today, and she was going to talk about his issues and how to care for [name].  Timothy Cordova was tearful during our conversation, sharing her concerns for both Timothy Cordova and Timothy Cordova.  She tells me that he "looked really scared", but he is usually easy going and mellow.   We talk about home with hospice versus rehab.  I share my concern about Timothy Cordova going to rehab, and never being able to leave. Timothy Cordova shares, "He told me a few weeks ago that he was ready".  She shares [name]'s concerns about who would care for Timothy Cordova states Ann her goal is for Timothy Cordova to live with Timothy Cordova's family when Mr. [name] passes, and is again tearful sharing worry that Timothy Cordova will die  soon after [name].  We talk about changes with loosing spouse. Timothy Cordova tells me that Timothy Cordova had consult with hospice in the past, but said "It cost too much".  We talk in detail about hospice in home services and I encourage Timothy Cordova to co ordinate and be with [name] during hospice intake.  We talk about symptom management, service schedules, and residential hospice if needed. Provider choice offered. Choice is [facility], [place] [facility] is likely closer.   I return to [name]'s room to discuss hospice with him, but he remains confused.  He does tell me that he would like for Timothy Cordova to make choices and set services in place.  He tells me that he trusts her.    Conference with CM related to in home hospice care and the need for [name] to be main contact person.   Conference with hospitalist related to in home hospice care.   HCPOA    NEXT OF KIN -  [name] tells me that his wife Timothy Cordova, granddaughter Timothy Cordova and son Timothy Cordova would make healthcare choices if he could not.    SUMMARY OF RECOMMENDATIONS   Agreeable for home with Hospice and  [facility] [place] [facility].  We discuss do not rehospitalize, but transfer to residential hospice when needed.   Code Status/Advance Care Planning:  DNR  Symptom Management:   Per hospitalist, no additional needs at this time.   Palliative Prophylaxis:   Aspiration, Oral Care and Turn Reposition  Additional Recommendations (Limitations, Scope, Preferences):  home with hospice for treat the treatable care. encouraged to not rehospitalize.   Psycho-social/Spiritual:   Desire for further Chaplaincy support:no  Additional Recommendations: Caregiving  Support/Resources and Education on Hospice  Prognosis:   < 3 months, would not be surprising based on chronic illness burden, 4 hospitalizations and 1 ED visit in 6 months  Discharge Planning: Home with [facility]      Primary Diagnoses: Present on Admission: . Acute bronchitis due to respiratory syncytial virus (RSV)   I have reviewed the medical record, interviewed the patient and family, and examined the patient. The following aspects are pertinent.  Past Medical History:  Diagnosis Date  . Allergic rhinitis   . Benign prostatic hypertrophy   . CHF (congestive heart failure) (HCC)   . Chronic diastolic heart failure (HCC)    8/08 ECHO with EF 55%  . CKD (chronic kidney disease)   . COPD (chronic obstructive pulmonary disease) (HCC)    moderate. followed by Dr.Byrum  . Coronary artery disease    s/p CABG 2006. LHC (1/10): SVG-OM and PLOM, SVG-D, and LIMA-LAD were all patent  . Dementia (HCC)    (mild)--06/2006  . Diabetes mellitus   . Edema    localized. suspect diastolic CHF + venous insufficiency  . Hearing loss    left >>right  . History of tobacco abuse   . Hypertension   . Mixed hyperlipidemia   . Osteoarthritis   . Pneumonia 07/31/2017  . Sleep apnea    no CPAP   Social History   Socioeconomic History  . Marital status: Married    Spouse name: Timothy Cordova  . Number of  children: Not on file  . Years of education: Not on file  . Highest education level: Not on file  Occupational History  . 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 Financial resource strain: Not on file  . Food insecurity:  Worry: Not on file    Inability: Not on file  . Transportation needs:    Medical: Not on file    Non-medical: Not on file  Tobacco Use  . Smoking status: Former Smoker    Packs/day: 3.00    Years: 60.00    Pack years: 180.00    Types: Cigarettes    Last attempt to quit: 07/12/2005    Years since quitting: 12.9  . Smokeless tobacco: Never Used  Substance and Sexual Activity  . Alcohol use: No    Alcohol/week: 0.0 standard drinks  . Drug use: No  . Sexual activity: Not on file  Lifestyle  . Physical activity:    Days per week: Not on file    Minutes per session: Not on file  . Stress: Not on file  Relationships  . Social connections:    Talks on phone: Not on file    Gets together: Not on file    Attends religious service: Not on file    Active member of club or organization: Not on file    Attends meetings of clubs or organizations: Not on file    Relationship status: Not on file  Other Topics Concern  . Not on file  Social History Narrative   Married to wife Timothy Cordova is Timothy Cordova   Granddaughter is active in care   Retired Radio broadcast assistant      Advanced directives:   Discussed with Timothy Cordova and his wife Timothy Hole who is HCPOA.  He would like to be made DNR / DNI and he has a [place] DNR/DNI form at his house.  He does not want shock, CPR, defibrillation, or any life support. They have been asked to bring in his HCPOA documentation to scan into [facility] chart. He does want active treatment of ongoing illness including infection, COPD exacerbations, HF exacerbations, etc.  Electronically Signed  By: Hannah Beat, MD On: 05/09/2018 6:40 PM    Family History  Problem Relation Age of Onset  . Heart attack Mother    . Lung cancer Sister   . Prostate cancer Neg Hx   . Kidney cancer Neg Hx    Scheduled Meds: . aspirin EC  81 mg Oral Daily  . doxazosin  4 mg Oral QHS  . enoxaparin (LOVENOX) injection  30 mg Subcutaneous Q24H  . furosemide  60 mg Intravenous Q12H  . guaiFENesin  600 mg Oral BID  . insulin aspart  0-15 Units Subcutaneous TID WC  . insulin aspart  0-5 Units Subcutaneous QHS  . insulin glargine  12 Units Subcutaneous Daily  . ipratropium-albuterol  3 mL Nebulization TID  . methylPREDNISolone (SOLU-MEDROL) injection  60 mg Intravenous Q6H  . metoprolol  100 mg Oral Daily  . mometasone-formoterol  2 puff Inhalation BID  . multivitamin  1 tablet Oral Q breakfast  . multivitamin with minerals  1 tablet Oral Daily  . polyvinyl alcohol  2 drop Both Eyes BID  . rosuvastatin  40 mg Oral Daily  . sodium chloride flush  3 mL Intravenous Q12H   Continuous Infusions: . sodium chloride    . azithromycin     PRN Meds:.sodium chloride, acetaminophen **OR** acetaminophen, diphenhydrAMINE **AND** acetaminophen, albuterol, naloxone, ondansetron **OR** ondansetron (ZOFRAN) IV, Resource ThickenUp Clear, sodium chloride flush, zolpidem Medications Prior to Admission:  Prior to Admission medications   Medication Sig Start Date End Date Taking? Authorizing Provider  albuterol (PROVENTIL HFA;VENTOLIN HFA) 108 (90 Base) MCG/ACT inhaler Inhale 2 puffs into the lungs  every 6 (six) hours as needed for wheezing or shortness of breath. 04/12/18   Rolly Salter, MD  apixaban (ELIQUIS) 2.5 MG TABS tablet Take 1 tablet (2.5 mg total) by mouth 2 (two) times daily. Patient not taking: Reported on 07/02/2018 05/17/18   Hannah Beat, MD  aspirin EC 81 MG tablet Take 1 tablet (81 mg total) by mouth daily. 04/15/18   Copland, Karleen Hampshire, MD  Carboxymethylcellulose Sod PF 0.25 % SOLN Place 2 drops into both eyes 2 (two) times daily.     [provider]  diphenhydramine-acetaminophen (TYLENOL PM) 25-500 MG TABS  tablet Take 1 tablet by mouth at bedtime as needed (sleep).    [provider]  doxazosin (CARDURA) 8 MG tablet Take 0.5 tablets (4 mg total) by mouth at bedtime. 05/22/18   Copland, Karleen Hampshire, MD  furosemide (LASIX) 20 MG tablet Take 1 tablet (20 mg total) by mouth daily as needed for fluid or edema. 04/12/18   Rolly Salter, MD  glipiZIDE (GLUCOTROL) 10 MG tablet Take 1 tablet (10 mg total) by mouth 2 (two) times daily before a meal. 06/13/18   Copland, Karleen Hampshire, MD  ipratropium-albuterol (DUONEB) 0.5-2.5 (3) MG/3ML SOLN Take 3 mLs by nebulization 4 (four) times daily. 05/09/18   Copland, Karleen Hampshire, MD  metoprolol (TOPROL-XL) 200 MG 24 hr tablet Take 0.5 tablets (100 mg total) by mouth daily. 05/22/18   Copland, Karleen Hampshire, MD  mometasone [facility]) 220 MCG/INH inhaler Inhale 2 puffs into the lungs daily. 06/06/18   Copland, Karleen Hampshire, MD  Multiple Vitamins-Minerals (CENTRUM SILVER 50+MEN PO) Take 1 tablet by mouth daily.    [provider]  Multiple Vitamins-Minerals (PRESERVISION AREDS 2) CAPS Take 1 capsule by mouth daily with breakfast.     [provider]  ONE TOUCH ULTRA TEST test strip USE TO CHECK BLOOD SUGAR 2 TIMES DAILY AS DIRECTED Patient taking differently: 2 (two) times daily.  09/08/15   Copland, Karleen Hampshire, MD  predniSONE (DELTASONE) 10 MG tablet Take 1 tablet (10 mg total) by mouth daily with breakfast. Patient not taking: Reported on 07/02/2018 05/09/18   Copland, Karleen Hampshire, MD  rosuvastatin (CRESTOR) 40 MG tablet Take 1 tablet (40 mg total) by mouth daily. 09/17/17   Copland, Karleen Hampshire, MD  Tiotropium Bromide-Olodaterol (STIOLTO RESPIMAT) 2.5-2.5 MCG/ACT AERS Inhale 2 puffs into the lungs daily.     [provider]   Allergies  Allergen Reactions  . Metformin And Related Nausea And Vomiting and Rash  . Tramadol Hcl Other (See Comments)    Tremor vs seizure activity  . Codeine Nausea And Vomiting  . Gabapentin Other (See Comments)    "Messed up my nervous system"   . Morphine Sulfate Nausea And Vomiting  . Other Nausea And Vomiting    "Narcotics, in general"  . Ticlopidine Hcl Other (See Comments)    Reaction unknown by patient or family  . Warfarin Sodium Nausea And Vomiting and Other (See Comments)    "Severe fatigue," also  . Xarelto [Rivaroxaban] Other (See Comments)    Weak joints (falls)  . Ace Inhibitors Rash and Cough  . Tramadol Rash   Review of Systems  Unable to perform ROS: Age    Physical Exam Vitals signs and nursing note reviewed.  Constitutional:      Comments: Makes and mostly keeps eye contact, appears acutely/chronically ill and weak.   HENT:     Head: Atraumatic.  Cardiovascular:     Rate and Rhythm: Normal rate.  Pulmonary:     Comments:  Able to make sentences, but SOB at times.  Abdominal:     Comments: Obese abd, soft   Skin:    General: Skin is warm and dry.  Neurological:     Mental Status: He is alert and oriented to person, place, and time.     Comments: Confused/ ? Hallucinating   Psychiatric:     Comments: Calm and cooperative.      Vital Signs: BP (!) 149/65 (BP Location: Left Arm)   Pulse 72   Temp 98.2 F (36.8 C) (Oral)   Resp 18   SpO2 99%  Pain Scale: 0-10   Pain Score: 0-No pain   SpO2: SpO2: 99 % O2 Device:SpO2: 99 % O2 Flow Rate: .O2 Flow Rate (L/min): (S) 2 L/min  IO: Intake/output summary:   Intake/Output Summary (Last 24 hours) at 07/04/2018 1111 Last data filed at 07/03/2018 1739 Gross per 24 hour  Intake 250 ml  Output -  Net 250 ml    LBM:   Baseline Weight:   Most recent weight:       Palliative Assessment/Data:   Flowsheet Rows     Most Recent Value  Intake Tab  Referral Department  Hospitalist  Unit at Time of Referral  Cardiac/Telemetry Unit  Palliative Care Primary Diagnosis  Pulmonary  Date Notified  07/04/18  Palliative Care Type  Return patient Palliative Care  Reason for referral  Clarify Goals of Care  Date of Admission  07/03/18  Date first seen  by Palliative Care  07/04/18  # of days Palliative referral response time  0 Day(s)  # of days IP prior to Palliative referral  1  Clinical Assessment  Palliative Performance Scale Score  40%  Pain Max last 24 hours  Not able to report  Pain Min Last 24 hours  Not able to report  Dyspnea Max Last 24 Hours  Not able to report  Dyspnea Min Last 24 hours  Not able to report  Psychosocial & Spiritual Assessment  Palliative Care Outcomes      Time In: 0910 Time Out: 1045 Time Total:  Greater than 50%  of this time was spent counseling and coordinating care related to the above assessment and plan.  Signed by: Katheran Awe, NP   Please contact Palliative Medicine Team phone at [phone] for questions and concerns.  For individual provider: See Loretha Stapler

## 2018-07-04 NOTE — Evaluation (Signed)
Occupational Therapy Evaluation Patient Details Name: Timothy BELFIELD Sr. MRN: [id number] DOB: May 21, 1928 Today's Date: 07/04/2018    History of Present Illness 85 year old male with history of CAD, CABG, A. fib, mild dementia, COPD, chronic hypoxic respiratory failure on home oxygen, chronic diastolic CHF, sleep apnea not on CPAP who was recently admitted on 07/01/2018 for COPD exacerbation and had signed out AGAINST MEDICAL ADVICE on 07/03/2018, respiratory virus panel was positive for RSV.  He presented back to the hospital on the same day on 07/03/2018 with worsening shortness of breath and was admitted again for COPD exacerbation.   Clinical Impression   Pt admitted with the above diagnoses and presents with below problem list. Pt will benefit from continued acute OT to address the below listed deficits and maximize independence with basic ADLs. Pt with some difficulty providing PLOF data. He reports he helps spouse with things around the house. Pt seen in January by OT and was listed as independent with ADLs and mobility prior to that admission. Pt with noted BLE weakness, now h/o fall. Unable to walk distance to bathroom without taking a seated rest break, BLE tremulous. +2 assist for safety with short distance mobility. Pt mentioned various family members during session and if family is able to provide 24 assist could see him going home with Hospice services (and likely w/c, hospital bed, BSC). If not then will need SNF. Will continue to follow acutely and update recommendations as indicated.      Follow Up Recommendations  SNF;Other (comment);Supervision/Assistance - 24 hour(vs home with Hospice and 24 hour assist)    Equipment Recommendations       Recommendations for Other Services       Precautions / Restrictions Precautions Precautions: Fall;Other (comment) Precaution Comments: watch SpO2  Restrictions Weight Bearing Restrictions: No Other Position/Activity Restrictions: BLE  weakness, watch for legs buckling       Mobility Bed Mobility Overal bed mobility: Needs Assistance Bed Mobility: Supine to Sit     Supine to sit: Min assist     General bed mobility comments: MinA to bring legs over EOB and pull up to sitting at EOB, once up demonstrated good sitting balance   Transfers Overall transfer level: Needs assistance Equipment used: Rolling walker (2 wheeled) Transfers: Sit to/from Stand Sit to Stand: Min assist         General transfer comment: MinA to boost to full standing position and steady prior to ambulation     Balance Overall balance assessment: Needs assistance;History of Falls Sitting-balance support: Feet supported;Bilateral upper extremity supported Sitting balance-Leahy Scale: Good Sitting balance - Comments: min guard for safety considering tremulousness    Standing balance support: Bilateral upper extremity supported;During functional activity Standing balance-Leahy Scale: Poor Standing balance comment: heavy reliance on B UE support, very shaky and weak                            ADL either performed or assessed with clinical judgement   ADL Overall ADL's : Needs assistance/impaired Eating/Feeding: Set up;Sitting   Grooming: Minimal assistance;Sitting   Upper Body Bathing: Minimal assistance;Sitting   Lower Body Bathing: Minimal assistance;Sit to/from stand   Upper Body Dressing : Minimal assistance;Sitting   Lower Body Dressing: Minimal assistance;Sit to/from stand   Toilet Transfer: Minimal assistance;+2 for safety/equipment Toilet Transfer Details (indicate cue type and reason): chair follow with pt needing to sit after walking about 3'.  Functional mobility during ADLs: Minimal assistance;Rolling walker General ADL Comments: Pt completed bed mobility, sat EOB a few minutes then walked from EOB to door needing 1 seated rest break. Pt very fearful of falling and with noted BLE weakness      Vision         Perception     Praxis      Pertinent Vitals/Pain Pain Assessment: Faces Pain Score: 0-No pain Faces Pain Scale: No hurt Pain Intervention(s): Monitored during session     Hand Dominance Right   Extremity/Trunk Assessment Upper Extremity Assessment Upper Extremity Assessment: Generalized weakness   Lower Extremity Assessment Lower Extremity Assessment: Defer to PT evaluation;Generalized weakness   Cervical / Trunk Assessment Cervical / Trunk Assessment: Kyphotic   Communication Communication Communication: Other (comment)(tangential, verbal perseveration )   Cognition Arousal/Alertness: Awake/alert Behavior During Therapy: Anxious(tearful at times ) Overall Cognitive Status: History of cognitive impairments - at baseline                                 General Comments: often needs redirection to task, tangential, cofabulation?   General Comments       Exercises     Shoulder Instructions      Home Living Family/patient expects to be discharged to:: Unsure Living Arrangements: Spouse/significant other                               Additional Comments: patient with very tangential speech and unable to focus on topics to provide PLOF information       Prior Functioning/Environment Level of Independence: Independent        Comments: Pt reports he assist spouse around the house. Unclear exact PLOF but in January was independent with ADLs and mobility. Does endorse recent falls        OT Problem List: Decreased activity tolerance;Decreased strength;Impaired balance (sitting and/or standing);Decreased cognition;Decreased safety awareness;Decreased knowledge of use of DME or AE;Decreased knowledge of precautions;Cardiopulmonary status limiting activity      OT Treatment/Interventions: Self-care/ADL training;Energy conservation;DME and/or AE instruction;Therapeutic activities;Cognitive  remediation/compensation;Patient/family education;Balance training    OT Goals(Current goals can be found in the care plan section) Acute Rehab OT Goals Patient Stated Goal: get stronger, stop falling  OT Goal Formulation: With patient Time For Goal Achievement: 07/18/18 Potential to Achieve Goals: Good ADL Goals Pt Will Perform Grooming: (P) with modified independence;sitting;standing Pt Will Perform Lower Body Bathing: (P) with modified independence;sit to/from stand Pt Will Perform Lower Body Dressing: (P) with modified independence;sit to/from stand Pt Will Transfer to Toilet: (P) with min guard assist;ambulating Pt Will Perform Toileting - Clothing Manipulation and hygiene: (P) with min guard assist;sit to/from stand  OT Frequency: Min 2X/week   Barriers to D/C: Decreased caregiver support  unclear what level of assist family is able to provide       Co-evaluation PT/OT/SLP Co-Evaluation/Treatment: Yes Reason for Co-Treatment: Complexity of the patient's impairments (multi-system involvement);Necessary to address cognition/behavior during functional activity;For patient/therapist safety PT goals addressed during session: Mobility/safety with mobility;Balance;Proper use of DME OT goals addressed during session: ADL's and self-care      AM-PAC OT "6 Clicks" Daily Activity     Outcome Measure Help from another person eating meals?: None Help from another person taking care of personal grooming?: A Little Help from another person toileting, which includes using toliet, bedpan, or urinal?: A Lot Help  from another person bathing (including washing, rinsing, drying)?: A Lot Help from another person to put on and taking off regular upper body clothing?: A Little Help from another person to put on and taking off regular lower body clothing?: A Little 6 Click Score: 17   End of Session Equipment Utilized During Treatment: Gait belt;Rolling walker;Other (comment);Oxygen(chair  utilized)  Activity Tolerance: Patient limited by fatigue;Patient tolerated treatment well Patient left: in chair;with call bell/phone within reach;with chair alarm set;Other (comment)(with palliative present)  OT Visit Diagnosis: Unsteadiness on feet (R26.81);History of falling (Z91.81);Muscle weakness (generalized) (M62.81);Other symptoms and signs involving cognitive function                Time: 7829-5621 OT Time Calculation (min): 43 min Charges:  OT General Charges $OT Visit: 1 Visit OT Evaluation $OT Eval Moderate Complexity: 1 Mod  Raynald Kemp, OT Acute Rehabilitation Services Pager: [phone] Office: [phone]   Pilar Grammes 07/04/2018, 2:46 PM

## 2018-07-04 NOTE — Evaluation (Signed)
Clinical/Bedside Swallow Evaluation Patient Details  Name: GRIER SOUDER Sr. MRN: [id number] Date of Birth: 1928/08/31  Today's Date: 07/04/2018 Time: SLP Start Time (ACUTE ONLY): 4801 SLP Stop Time (ACUTE ONLY): 0935 SLP Time Calculation (min) (ACUTE ONLY): 17 min  Past Medical History:  Past Medical History:  Diagnosis Date  . Allergic rhinitis   . Benign prostatic hypertrophy   . CHF (congestive heart failure) (HCC)   . Chronic diastolic heart failure (HCC)    8/08 ECHO with EF 55%  . CKD (chronic kidney disease)   . COPD (chronic obstructive pulmonary disease) (HCC)    moderate. followed by Dr.Byrum  . Coronary artery disease    s/p CABG 2006. LHC (1/10): SVG-OM and PLOM, SVG-D, and LIMA-LAD were all patent  . Dementia (HCC)    (mild)--06/2006  . Diabetes mellitus   . Edema    localized. suspect diastolic CHF + venous insufficiency  . Hearing loss    left >>right  . History of tobacco abuse   . Hypertension   . Mixed hyperlipidemia   . Osteoarthritis   . Pneumonia 07/31/2017  . Sleep apnea    no CPAP   Past Surgical History:  Past Surgical History:  Procedure Laterality Date  . ANGIOPLASTY  [id number]  . CARDIAC CATHETERIZATION  90s   [place], [place] x1stent  . CARDIAC CATHETERIZATION     [facility];x2 stents  . CARDIAC CATHETERIZATION  04/29/2013  . CARDIOVERSION N/A 12/05/2017   Procedure: CARDIOVERSION;  Surgeon: Elease Hashimoto Deloris Ping, MD;  Location: [facility] ENDOSCOPY;  Service: Cardiovascular;  Laterality: N/A;  . CORONARY ARTERY BYPASS GRAFT  02/2005  . ROTATOR CUFF REPAIR  2005   left, Gioffre  . TEE WITHOUT CARDIOVERSION N/A 12/05/2017   Procedure: TRANSESOPHAGEAL ECHOCARDIOGRAM (TEE);  Surgeon: Elease Hashimoto Deloris Ping, MD;  Location: [facility] ENDOSCOPY;  Service: Cardiovascular;  Laterality: N/A;   HPI:  Pt is an 85 yo male presenting with hypoxia after leaving AMA earlier that day for admission related to [facility] COPD exacerbation.  CXR that admission without acute disease or  consolidation. MBS 06/13/18 recommended regular solids and nectar thick liquids by tsp only, given aspiration of thin liquids and larger boluses of nectar. Aspiration was described as silent with thin liquids and with a delayed cough following thicker liquids. PMH dementia, chronic diastolic CHF s/p CABG, A. fib, CAD, CKD stage III, diabetes type 2 uncontrolled with complication, OSA (not on CPAP), tobacco abuse, end-stage COPD, PNA   Assessment / Plan / Recommendation Clinical Impression  Pt is known to this SLP from recent admission (left AMA yesterday), and he appears more short of breath to me this morning in comparison. No overt coughing was noted across PO trials tested: spoonfuls of nectar and honey thick liquids, purees. Aspiration on MBS was inconsistently sensed. Although CXR upon recent admission was clear, pt is likely at a higher risk for aspiration given his changes in his respiratory status and generalized weakness. I shared this with the pt, but he says that he does not want to do instrumental testing at this time. He is however agreeable to trying a more modified diet to potentially account for acute changes, although I did share with him that there is still risk for aspiration with that. He acknowledges the risk and wants to accept it. Discussed with MD, who is okay with starting a modified diet with pt accepting risk. Will start Dys 2 diet and honey thick liquids by spoon. Will f/u for tolerance - pt may benefit from palliative  care consult as well to help delineate GOC. SLP Visit Diagnosis: Dysphagia, pharyngeal phase (R13.13)    Aspiration Risk  Moderate aspiration risk    Diet Recommendation Dysphagia 2 (Fine chop);Honey-thick liquid   Liquid Administration via: Spoon Medication Administration: Crushed with puree Supervision: Staff to assist with self feeding;Full supervision/cueing for compensatory strategies Compensations: Slow rate;Small sips/bites Postural Changes: Seated  upright at 90 degrees    Other  Recommendations Oral Care Recommendations: Oral care BID Other Recommendations: Order thickener from pharmacy;Prohibited food (jello, ice cream, thin soups);Remove water pitcher   Follow up Recommendations (SLP f/u at next level of care)      Frequency and Duration min 2x/week  2 weeks       Prognosis Prognosis for Safe Diet Advancement: Fair Barriers to Reach Goals: Other (Comment)(participation and understanding)      Swallow Study   General HPI: Pt is an 85 yo male presenting with hypoxia after leaving AMA earlier that day for admission related to likley COPD exacerbation.  CXR that admission without acute disease or consolidation. MBS 06/13/18 recommended regular solids and nectar thick liquids by tsp only, given aspiration of thin liquids and larger boluses of nectar. Aspiration was described as silent with thin liquids and with a delayed cough following thicker liquids. PMH dementia, chronic diastolic CHF s/p CABG, A. fib, CAD, CKD stage III, diabetes type 2 uncontrolled with complication, OSA (not on CPAP), tobacco abuse, end-stage COPD, PNA Type of Study: Bedside Swallow Evaluation Previous Swallow Assessment: see HPI Diet Prior to this Study: Regular;Thin liquids Temperature Spikes Noted: No Respiratory Status: Nasal cannula History of Recent Intubation: No Behavior/Cognition: Alert;Cooperative Oral Cavity Assessment: Dry Oral Care Completed by SLP: No Oral Cavity - Dentition: Dentures, top;Dentures, bottom Vision: Functional for self-feeding Self-Feeding Abilities: Needs assist Patient Positioning: Upright in bed Baseline Vocal Quality: Normal    Oral/Motor/Sensory Function     Ice Chips Ice chips: Not tested   Thin Liquid Thin Liquid: Not tested    Nectar Thick Nectar Thick Liquid: Within functional limits Presentation: Spoon   Honey Thick Honey Thick Liquid: Within functional limits Presentation: Spoon   Puree Puree: Within  functional limits Presentation: Spoon   Solid     Solid: Impaired(pt declined)      Virl Axe Nainika Newlun 07/04/2018,10:33 AM  Ivar Drape, M.A. CCC-SLP Acute Herbalist [phone] Office [phone]

## 2018-07-04 NOTE — Plan of Care (Signed)

## 2018-07-04 NOTE — Progress Notes (Signed)
Inpatient Diabetes Program Recommendations  AACE/ADA: New Consensus Statement on Inpatient Glycemic Control (2015)  Target Ranges:  Prepandial:   less than 140 mg/dL      Peak postprandial:   less than 180 mg/dL (1-2 hours)      Critically ill patients:  140 - 180 mg/dL   Lab Results  Component Value Date   GLUCAP 311 (H) 07/03/2018   HGBA1C 8.3 (H) 07/01/2018    Review of Glycemic Control  Diabetes history: type 2 Outpatient Diabetes medications: glucotrol 10 mg BID Current orders for Inpatient glycemic control: Novolog MODERATE correction scale TID & HS scale  Inpatient Diabetes Program Recommendations:    Noted that patient's blood sugars continue to be greater than 180 mg/dl and on steroids. Recommend increasing Novolog to RESISTANT correction scale TID & HS. May need low dose of basal insulin Lantus 12 units daily (81.6 kg X 0.15 units/kg = 12.24 units) and especially while on steroids.  Will continue to monitor blood sugars while in the hospital.  Smith Mince RN BSN CDE Diabetes Coordinator Pager: [phone]  8am-5pm

## 2018-07-04 NOTE — Telephone Encounter (Signed)
Timothy Cordova from [facility] called for orders orders to start Hospice care. HE is being d/c from the hospital and needs Hospice care. Please call Crystal with verbal orders at [phone]

## 2018-07-04 NOTE — Telephone Encounter (Signed)
Pt's wife stated she has to speak to you concerning her husband. She forgot to tell you something.

## 2018-07-04 NOTE — Telephone Encounter (Signed)
Timothy Cordova called me from the hospital.  He and Timothy Cordova are very well known for many years to me.  He told me he was worried about his falls, breathing.  He also was worried some about his blood thinner was making him fall.  I told him that I really don't think that it is - honestly, he does not need to be on it at this point.  I told him that I thought that his problem was essentially respiratory failure and end-stage COPD. He does have RSV now.  The record and Timothy Cordova tell me he was completely out of it and hallucinating at home and in the hospital.  I then spoke to the patient's wife Timothy Cordova on the telephone.  She is also very well-known.  She has a very good understanding of what is going on in the hospital and this is primarily driven from the patient's end-stage lung disease.  She was at home with her granddaughter Timothy Cordova, who had been talking to the palliative care team.  Timothy Cordova is doing somewhat okay, and she and her granddaughter Timothy Cordova are comfortable at this point moving forward with hospice, hopefully in a home hospice situation if he is medically stable for this.  >30 minutes spent on these phone calls.

## 2018-07-04 NOTE — Evaluation (Signed)
Physical Therapy Evaluation Patient Details Name: COTTON BECKLEY Sr. MRN: [id number] DOB: 1928-11-27 Today's Date: 07/04/2018   History of Present Illness  85 year old male with history of CAD, CABG, A. fib, mild dementia, COPD, chronic hypoxic respiratory failure on home oxygen, chronic diastolic CHF, sleep apnea not on CPAP who was recently admitted on 07/01/2018 for COPD exacerbation and had signed out [facility] on 07/03/2018, respiratory virus panel was positive for RSV.  He presented back to the hospital on the same day on 07/03/2018 with worsening shortness of breath and was admitted again for COPD exacerbation.  Clinical Impression   Patient received in bed, initially resistant to attempting therapy but able to be convinced with encouragement and emphatic listening; patient with very tangential speech and perseveration requiring frequent redirection during session. Able to perform bed mobility, functional transfers, and gait 40fx2 with RW and MinA but very tremulous and shaky during mobility, also expresses high fear of falling at this time. He was left up in the chair with all needs met, chair alarm active, and staff present and attending. He will continue to benefit from skilled PT services in the acute setting, however due to high number of falls at home, very poor safety awareness, and patient being primary caregiver for his wife do not feel that he would be able to return home without 24/7 physical assist. Currently recommending SNF vs home with hospice and 24/7 physical assist from family if this is possible.     Follow Up Recommendations SNF;Other (comment);Supervision/Assistance - 24 hour(SNF vs home with hospice with 24-hour assist )    Equipment Recommendations  Rolling walker with 5" wheels;3in1 (PT)    Recommendations for Other Services       Precautions / Restrictions Precautions Precautions: Fall;Other (comment) Precaution Comments: watch SpO2   Restrictions Weight Bearing Restrictions: No Other Position/Activity Restrictions: BLE weakness, watch for legs buckling       Mobility  Bed Mobility Overal bed mobility: Needs Assistance Bed Mobility: Supine to Sit     Supine to sit: Min assist     General bed mobility comments: MinA to bring legs over EOB and pull up to sitting at EOB, once up demonstrated good sitting balance   Transfers Overall transfer level: Needs assistance Equipment used: Rolling walker (2 wheeled) Transfers: Sit to/from Stand Sit to Stand: Min assist         General transfer comment: MinA to boost to full standing position and steady prior to ambulation   Ambulation/Gait Ambulation/Gait assistance: Min assist Gait Distance (Feet): 3 Feet(x2) Assistive device: Rolling walker (2 wheeled) Gait Pattern/deviations: Step-to pattern;Decreased step length - right;Decreased step length - left;Decreased stride length;Trunk flexed;Wide base of support;Shuffle Gait velocity: decreased    General Gait Details: shuffling gait pattern and very easily fatigued, tremulous and shaky due to general weakness. Close chair follow.   Stairs            Wheelchair Mobility    Modified Rankin (Stroke Patients Only)       Balance Overall balance assessment: Needs assistance;History of Falls Sitting-balance support: Feet supported;Bilateral upper extremity supported Sitting balance-Leahy Scale: Good Sitting balance - Comments: min guard for safety considering tremulousness    Standing balance support: Bilateral upper extremity supported;During functional activity Standing balance-Leahy Scale: Poor Standing balance comment: heavy reliance on B UE support, very shaky and weak  Pertinent Vitals/Pain Pain Assessment: Faces Pain Score: 0-No pain Faces Pain Scale: No hurt Pain Intervention(s): Monitored during session    Home Living Family/patient expects to be  discharged to:: Unsure Living Arrangements: Spouse/significant other               Additional Comments: patient with very tangential speech and unable to focus on topics to provide PLOF information     Prior Function Level of Independence: Independent         Comments: Pt reports he assist spouse around the house. Unclear exact PLOF but in January was independent with ADLs and mobility. Does endorse recent falls     Hand Dominance   Dominant Hand: Right    Extremity/Trunk Assessment   Upper Extremity Assessment Upper Extremity Assessment: Defer to OT evaluation    Lower Extremity Assessment Lower Extremity Assessment: Generalized weakness    Cervical / Trunk Assessment Cervical / Trunk Assessment: Kyphotic  Communication   Communication: Other (comment)(tangential, verbal perseveration )  Cognition Arousal/Alertness: Awake/alert Behavior During Therapy: Anxious(tearful at times ) Overall Cognitive Status: History of cognitive impairments - at baseline                                 General Comments: often needs redirection to task, tangential, cofabulation?      General Comments      Exercises     Assessment/Plan    PT Assessment Patient needs continued PT services  PT Problem List Decreased strength;Decreased balance;Decreased mobility;Decreased knowledge of use of DME;Decreased activity tolerance;Decreased coordination;Decreased safety awareness;Decreased cognition       PT Treatment Interventions DME instruction;Functional mobility training;Balance training;Patient/family education;Gait training;Therapeutic activities;Neuromuscular re-education;Stair training;Therapeutic exercise;Cognitive remediation    PT Goals (Current goals can be found in the Care Plan section)  Acute Rehab PT Goals Patient Stated Goal: get stronger, stop falling  PT Goal Formulation: With patient Time For Goal Achievement: 07/18/18 Potential to Achieve Goals:  Fair    Frequency Min 2X/week   Barriers to discharge        Co-evaluation PT/OT/SLP Co-Evaluation/Treatment: Yes Reason for Co-Treatment: Complexity of the patient's impairments (multi-system involvement);Necessary to address cognition/behavior during functional activity;For patient/therapist safety PT goals addressed during session: Mobility/safety with mobility;Balance;Proper use of DME         AM-PAC PT "6 Clicks" Mobility  Outcome Measure Help needed turning from your back to your side while in a flat bed without using bedrails?: A Little Help needed moving from lying on your back to sitting on the side of a flat bed without using bedrails?: A Little Help needed moving to and from a bed to a chair (including a wheelchair)?: A Little Help needed standing up from a chair using your arms (e.g., wheelchair or bedside chair)?: A Little Help needed to walk in hospital room?: A Little Help needed climbing 3-5 steps with a railing? : A Lot 6 Click Score: 17    End of Session Equipment Utilized During Treatment: Gait belt;Oxygen Activity Tolerance: Patient limited by fatigue;Patient tolerated treatment well Patient left: in chair;with call bell/phone within reach;with chair alarm set   PT Visit Diagnosis: Unsteadiness on feet (R26.81);Muscle weakness (generalized) (M62.81);History of falling (Z91.81);Difficulty in walking, not elsewhere classified (R26.2);Other abnormalities of gait and mobility (R26.89)    Time: 8921-1941 PT Time Calculation (min) (ACUTE ONLY): 43 min   Charges:   PT Evaluation $PT Eval Moderate Complexity: 1 Mod PT Treatments $Gait Training: 8-22  mins        Deniece Ree PT, DPT, CBIS  Supplemental Physical Therapist [facility]    Pager [phone] Acute Rehab Office [phone]

## 2018-07-04 NOTE — Consult Note (Signed)
   [facility] CM Inpatient Consult   07/04/2018  Timothy SOHAL Sr. [birth year] [id number]    Patient rescreened for serviceswith [facility] care management for his [facility] Medicare benefits after "leaving against medical advice" yesterday and was readmitted back last night for worsening shortness of breath and COPD exacerbation. He has an Extreme 31% unplanned readmissions and hospitalizations risk score. With history of [facility] telephonic services for COPD management.   Patient's current disposition was discussed over the phone with transition of care CM after palliative consult had been done and was informed that patient will discharge home with Hospice care per [facility]).   There are no identifiable [facility] care management needs at this point.   For additional information and questions, please call:  Glennys Schorsch A. Jozef Eisenbeis, BSN, RN-BC [facility] Liaison Cell: [phone]

## 2018-07-04 NOTE — Progress Notes (Signed)
Patient awake and insisstant on going home. States we are holding him against his will and wants to call the police. I explained to him he was brought here by ems and his car isn't here and he has no way home. I gave him the phone to call his family and they didn't answer. He continued repeating all of this even though he does answer all questions correctly. He is asleep now but charge nurse did attempt speaking to him.

## 2018-07-05 LAB — BASIC METABOLIC PANEL
Anion gap: 8 (ref 5–15)
BUN: 64 mg/dL — AB (ref 8–23)
CO2: 37 mmol/L — ABNORMAL HIGH (ref 22–32)
Calcium: 9.3 mg/dL (ref 8.9–10.3)
Chloride: 94 mmol/L — ABNORMAL LOW (ref 98–111)
Creatinine, Ser: 1.96 mg/dL — ABNORMAL HIGH (ref 0.61–1.24)
GFR calc Af Amer: 34 mL/min — ABNORMAL LOW (ref >60)
GFR calc non Af Amer: 30 mL/min — ABNORMAL LOW (ref >60)
GLUCOSE: 311 mg/dL — AB (ref 70–99)
Potassium: 3.7 mmol/L (ref 3.5–5.1)
Sodium: 139 mmol/L (ref 135–145)

## 2018-07-05 LAB — CBC WITH DIFFERENTIAL/PLATELET
Abs Immature Granulocytes: 0.13 10*3/uL — ABNORMAL HIGH (ref 0.00–0.07)
Basophils Absolute: 0 10*3/uL (ref 0.0–0.1)
Basophils Relative: 0 %
Eosinophils Absolute: 0 10*3/uL (ref 0.0–0.5)
Eosinophils Relative: 0 %
HCT: 30.6 % — ABNORMAL LOW (ref 39.0–52.0)
Hemoglobin: 9.2 g/dL — ABNORMAL LOW (ref 13.0–17.0)
Immature Granulocytes: 2 %
Lymphocytes Relative: 7 %
Lymphs Abs: 0.5 10*3/uL — ABNORMAL LOW (ref 0.7–4.0)
MCH: 29 pg (ref 26.0–34.0)
MCHC: 30.1 g/dL (ref 30.0–36.0)
MCV: 96.5 fL (ref 80.0–100.0)
MONO ABS: 0.5 10*3/uL (ref 0.1–1.0)
Monocytes Relative: 7 %
Neutro Abs: 6.5 10*3/uL (ref 1.7–7.7)
Neutrophils Relative %: 84 %
Platelets: 209 10*3/uL (ref 150–400)
RBC: 3.17 MIL/uL — ABNORMAL LOW (ref 4.22–5.81)
RDW: 13 % (ref 11.5–15.5)
WBC: 7.6 10*3/uL (ref 4.0–10.5)
nRBC: 0 % (ref 0.0–0.2)

## 2018-07-05 LAB — HEMOGLOBIN A1C
Hgb A1c MFr Bld: 8.4 % — ABNORMAL HIGH (ref 4.8–5.6)
Mean Plasma Glucose: 194.38 mg/dL

## 2018-07-05 LAB — GLUCOSE, CAPILLARY
GLUCOSE-CAPILLARY: 275 mg/dL — AB (ref 70–99)
Glucose-Capillary: 308 mg/dL — ABNORMAL HIGH (ref 70–99)

## 2018-07-05 LAB — MAGNESIUM: Magnesium: 2.1 mg/dL (ref 1.7–2.4)

## 2018-07-05 MED ORDER — FUROSEMIDE 20 MG PO TABS: 20.0000 mg | ORAL_TABLET | Freq: Two times a day (BID) | ORAL | 0 refills | Status: DC

## 2018-07-05 MED ORDER — GUAIFENESIN ER 600 MG PO TB12: 600.0000 mg | ORAL_TABLET | Freq: Two times a day (BID) | ORAL | 0 refills | Status: AC

## 2018-07-05 MED ORDER — PREDNISONE 20 MG PO TABS: 40.0000 mg | ORAL_TABLET | Freq: Every day | ORAL | 0 refills | Status: AC

## 2018-07-05 MED ORDER — MORPHINE SULFATE (CONCENTRATE) 10 MG /0.5 ML PO SOLN: 5.0000 mg | ORAL | 0 refills | Status: DC | PRN

## 2018-07-05 MED ORDER — ONDANSETRON HCL 4 MG PO TABS: 4.0000 mg | ORAL_TABLET | Freq: Four times a day (QID) | ORAL | 0 refills | Status: AC | PRN

## 2018-07-05 MED ORDER — LORAZEPAM 0.5 MG PO TABS: 0.5000 mg | ORAL_TABLET | Freq: Three times a day (TID) | ORAL | 0 refills | Status: AC | PRN

## 2018-07-05 MED ORDER — MOMETASONE FUROATE 220 MCG/INH IN AEPB: 2.0000 | INHALATION_SPRAY | Freq: Two times a day (BID) | RESPIRATORY_TRACT | 0 refills | Status: DC

## 2018-07-05 MED ORDER — AMOXICILLIN-POT CLAVULANATE 875-125 MG PO TABS: 1.0000 | ORAL_TABLET | Freq: Two times a day (BID) | ORAL | 0 refills | Status: AC

## 2018-07-05 NOTE — Discharge Summary (Signed)
Physician Discharge Summary  Timothy Postinhomas O Monarch Sr. WJX:[id number] DOB: September 10, 1928 DOA: 07/03/2018  PCP: Hannah Beatopland, Spencer, MD  Admit date: 07/03/2018 Discharge date: 07/05/2018  Admitted From: Home Disposition: Home with hospice  Recommendations for Outpatient Follow-up:  1. Follow up with home hospice at earliest convenience    Home Health: No Equipment/Devices: None  Discharge Condition: Stable CODE STATUS: Full Diet recommendation: Heart healthy/carb modified/as per SLP recommendations/as per comfort measures  Brief/Interim Summary: 85 year old male with history of CAD, CABG, A. fib, mild dementia, COPD, chronic hypoxic respiratory failure on home oxygen, chronic diastolic CHF, sleep apnea not on CPAP who was recently admitted on 07/01/2018 for COPD exacerbation and had signed out AGAINST MEDICAL ADVICE on 07/03/2018, respiratory virus panel was positive for RSV.  He presented back to the hospital on the same day on 07/03/2018 with worsening shortness of breath and was admitted again for COPD exacerbation.  Because of overall poor prognosis, palliative care was consulted.  Family has agreed for patient to be discharged home with hospice.  He will be discharged home with hospice.  Overall prognosis is very poor.  Discharge Diagnoses:  Active Problems:   Goals of care, counseling/discussion   Acute bronchitis due to respiratory syncytial virus (RSV)   Encounter for hospice care discussion  Generalized conditioning/overall very poor prognosis - Because of overall poor prognosis, palliative care was consulted.  Family has agreed for patient to be discharged home with hospice.  He will be discharged home with hospice.  Overall prognosis is very poor.  COPD exacerbation -Patient had signed out AGAINST MEDICAL ADVICE on 07/03/2018 and presented back on the same day with worsening shortness of breath. -Treated with Solu-Medrol, Dulera and duo nebs. -Chest x-ray on 07/02/2018 had shown no  consolidation.    Chest x-ray on 07/04/2018 showed mild peribronchial airspace consolidation in the left lower lobe. -Flu antigen was negative during last hospitalization.  Respiratory virus panel on 07/03/2018 was positive for RSV -We will discharge on oral prednisone 40 mg daily for 7 days, Augmentin for 5 days.  Continue mometasone at home along with as needed albuterol.  Acute on chronic hypoxic respiratory failure -Probably from COPD and CHF exacerbation -Needed 4 L oxygen initially. Normally wears 2 L oxygen at home.  Currently on 2 L oxygen via nasal cannula. -Diet as per SLP recommendations.  Patient will probably not want to eat dysphagia diet.  He will be at high risk for aspiration.  Since he is going home with hospice, he will continue on comfort feeds knowing the risks of aspiration.  Acute on chronic diastolic CHF/pulmonary -Patient requiredintravenous Lasix on 07/03/2018.  Treated with IV Lasix.  Discharge home on Lasix 20 mg twice a day. Fluid restriction. Continue Toprol. Cardiology team had DC'd the 2D echo ordered during recent hospitalization.  Chronic kidney disease stage III -Stable.  Diabetes mellitus type 2 with diabetic nephropathy -Resume glipizide.  Paroxysmal A. fib -On Eliquis at baseline. Patient discontinued Eliquis recently. This was discussed with the prior provider during the last hospitalization with the patient who understood the risk of stroke if not on anticoagulation but refused to resume anticoagulation at that time.   Failure to thrive/frequent falls -We will discharge home with home hospice.  Discharge Instructions  Discharge Instructions    Diet - low sodium heart healthy   Complete by:  As directed    Diet Carb Modified   Complete by:  As directed    Increase activity slowly   Complete by:  As  directed      Allergies as of 07/05/2018      Reactions   Metformin And Related Nausea And Vomiting, Rash   Tramadol Hcl Other (See  Comments)   Tremor vs seizure activity   Codeine Nausea And Vomiting   Gabapentin Other (See Comments)   "Messed up my nervous system"   Morphine Sulfate Nausea And Vomiting   Other Nausea And Vomiting   "Narcotics, in general"   Ticlopidine Hcl Other (See Comments)   Reaction unknown by patient or family   Warfarin Sodium Nausea And Vomiting, Other (See Comments)   "Severe fatigue," also   Xarelto [rivaroxaban] Other (See Comments)   Weak joints (falls)   Ace Inhibitors Rash, Cough   Tramadol Rash      Medication List    STOP taking these medications   apixaban 2.5 MG Tabs tablet Commonly known as:  Eliquis   ONE TOUCH ULTRA TEST test strip Generic drug:  glucose blood     TAKE these medications   albuterol 108 (90 Base) MCG/ACT inhaler Commonly known as:  PROVENTIL HFA;VENTOLIN HFA Inhale 2 puffs into the lungs every 6 (six) hours as needed for wheezing or shortness of breath.   aspirin EC 81 MG tablet Take 1 tablet (81 mg total) by mouth daily.   Carboxymethylcellulose Sod PF 0.25 % Soln Place 2 drops into both eyes 2 (two) times daily.   diphenhydramine-acetaminophen 25-500 MG Tabs tablet Commonly known as:  TYLENOL PM Take 1 tablet by mouth at bedtime as needed (sleep).   doxazosin 8 MG tablet Commonly known as:  CARDURA Take 0.5 tablets (4 mg total) by mouth at bedtime.   furosemide 20 MG tablet Commonly known as:  LASIX Take 1 tablet (20 mg total) by mouth 2 (two) times daily. What changed:    when to take this  reasons to take this   glipiZIDE 10 MG tablet Commonly known as:  GLUCOTROL Take 1 tablet (10 mg total) by mouth 2 (two) times daily before a meal.   guaiFENesin 600 MG 12 hr tablet Commonly known as:  MUCINEX Take 1 tablet (600 mg total) by mouth 2 (two) times daily for 10 days.   ipratropium-albuterol 0.5-2.5 (3) MG/3ML Soln Commonly known as:  DUONEB Take 3 mLs by nebulization 4 (four) times daily.   LORazepam 0.5 MG  tablet Commonly known as:  Ativan Take 1 tablet (0.5 mg total) by mouth every 8 (eight) hours as needed for anxiety.   metoprolol 200 MG 24 hr tablet Commonly known as:  TOPROL-XL Take 0.5 tablets (100 mg total) by mouth daily.   mometasone 220 MCG/INH inhaler Commonly known as:  ASMANEX Inhale 2 puffs into the lungs 2 (two) times daily. What changed:  when to take this   morphine CONCENTRATE 10 mg / 0.5 ml concentrated solution Take 0.25 mLs (5 mg total) by mouth every 2 (two) hours as needed for shortness of breath.   ondansetron 4 MG tablet Commonly known as:  ZOFRAN Take 1 tablet (4 mg total) by mouth every 6 (six) hours as needed for nausea.   predniSONE 20 MG tablet Commonly known as:  Deltasone Take 2 tablets (40 mg total) by mouth daily for 7 days. What changed:    medication strength  how much to take  when to take this   PreserVision AREDS 2 Caps Take 1 capsule by mouth daily with breakfast. What changed:  Another medication with the same name was removed. Continue taking this medication,  and follow the directions you see here.   rosuvastatin 40 MG tablet Commonly known as:  CRESTOR Take 1 tablet (40 mg total) by mouth daily.   Stiolto Respimat 2.5-2.5 MCG/ACT Aers Generic drug:  Tiotropium Bromide-Olodaterol Inhale 2 puffs into the lungs daily.      Follow-up Information    Copland, Spencer, MD. Schedule an appointment as soon as possible for a visit in 1 week(s).   Specialty:  Family Medicine Contact information: [address] [place] [address] [phone]        Antonieta Iba, MD .   Specialty:  Cardiology Contact information: [address] [place] [address] [phone]        Home hospice Follow up.   Why:  at earliest convenience         Allergies  Allergen Reactions  . Metformin And Related Nausea And Vomiting and Rash  . Tramadol Hcl Other (See Comments)    Tremor vs seizure activity  .  Codeine Nausea And Vomiting  . Gabapentin Other (See Comments)    "Messed up my nervous system"  . Morphine Sulfate Nausea And Vomiting  . Other Nausea And Vomiting    "Narcotics, in general"  . Ticlopidine Hcl Other (See Comments)    Reaction unknown by patient or family  . Warfarin Sodium Nausea And Vomiting and Other (See Comments)    "Severe fatigue," also  . Xarelto [Rivaroxaban] Other (See Comments)    Weak joints (falls)  . Ace Inhibitors Rash and Cough  . Tramadol Rash    Consultations:  Palliative care   Procedures/Studies: Dg Op Swallowing Func-medicare/speech Path  Result Date: 06/13/2018 CLINICAL DATA:  Dysphagia EXAM: MODIFIED BARIUM SWALLOW TECHNIQUE: Different consistencies of barium were administered orally to the patient by the Speech Pathologist. Imaging of the pharynx was performed in the lateral projection. The radiologist was present in the fluoroscopy room for this study, providing personal supervision. FLUOROSCOPY TIME:  Fluoroscopy Time:  1.1 minute Radiation Exposure Index (if provided by the fluoroscopic device): 2.1 mGy Number of Acquired Spot Images: 0 COMPARISON:  None. FINDINGS: Thin liquid: Laryngeal penetration with tracheal aspiration. Thick liquid: Laryngeal penetration with tracheal aspiration when ingested with a cup. No laryngeal penetration or tracheal aspiration when ingested by the spoonful. Nectar/applesauce/cracker: No laryngeal penetration or tracheal aspiration. IMPRESSION: Modified barium swallow as described above. Please refer to the Speech Pathologists report for complete details and recommendations. Electronically Signed   By: Elige Ko   On: 06/13/2018 11:31   Dg Chest Port 1 View  Result Date: 07/04/2018 CLINICAL DATA:  Aspiration. Shortness of breath. EXAM: PORTABLE CHEST 1 VIEW COMPARISON:  07/02/2018 FINDINGS: Stable postsurgical changes from CABG. Cardiomediastinal silhouette is mildly enlarged. Mediastinal contours appear intact.  Mild peribronchial airspace consolidation in the left lower lobe. Diffusely increased interstitial markings and architectural distortion of the interstitium. Upper lobe predominant emphysema. Osseous structures are without acute abnormality. Soft tissues are grossly normal. IMPRESSION: 1. Mild peribronchial airspace consolidation in the left lower lobe. 2. Diffusely increased interstitial markings and architectural distortion of the interstitium. Electronically Signed   By: Ted Mcalpine M.D.   On: 07/04/2018 11:19   Dg Chest Port 1 View  Result Date: 07/02/2018 CLINICAL DATA:  Hypoxia EXAM: PORTABLE CHEST 1 VIEW COMPARISON:  July 01, 2018 FINDINGS: Lungs remain hyperexpanded with scattered areas of mild scarring. There is no edema or consolidation. The heart size and pulmonary vascularity are normal. Patient is status post coronary artery  bypass grafting. No adenopathy. There is aortic atherosclerosis. There is a stent in the left innominate region. No bone lesions. Foci of axillary artery calcification noted. IMPRESSION: Lungs remain hyperexpanded. No edema or consolidation. Stable cardiac silhouette. Aortic Atherosclerosis (ICD10-I70.0). Electronically Signed   By: Bretta Bang III M.D.   On: 07/02/2018 08:18   Dg Chest Portable 1 View  Result Date: 07/01/2018 CLINICAL DATA:  Shortness of breath. EXAM: PORTABLE CHEST 1 VIEW COMPARISON:  Radiographs of May 03, 2018. FINDINGS: The heart size and mediastinal contours are within normal limits. Both lungs are clear. No pneumothorax or pleural effusion is noted. Status post coronary bypass graft. The visualized skeletal structures are unremarkable. IMPRESSION: No active disease. Electronically Signed   By: Lupita Raider, M.D.   On: 07/01/2018 14:21       Subjective: Patient seen and examined at bedside.  He is more awake and calm today.  Still a poor historian.  Wants to go home today.  No overnight worsening fever or shortness of  breath.  Discharge Exam: Vitals:   07/04/18 2144 07/05/18 0509  BP: 123/69 (!) 145/78  Pulse: 100 74  Resp:  (!) 26  Temp: 98.7 F (37.1 C) 97.6 F (36.4 C)  SpO2: 94% 98%   Vitals:   07/04/18 2042 07/04/18 2044 07/04/18 2144 07/05/18 0509  BP:   123/69 (!) 145/78  Pulse:   100 74  Resp:    (!) 26  Temp:   98.7 F (37.1 C) 97.6 F (36.4 C)  TempSrc:   Oral Axillary  SpO2: 91% 91% 94% 98%    General: Elderly male lying in bed.  More awake today and answers some questions.  Still a very poor historian. Cardiovascular: rate controlled, S1/S2 + Respiratory: bilateral decreased breath sounds at bases with scattered crackles and some wheezing.  Intermittent tachypnea Abdominal: Soft, NT, ND, bowel sounds + Extremities: Trace edema, no cyanosis    The results of significant diagnostics from this hospitalization (including imaging, microbiology, ancillary and laboratory) are listed below for reference.     Microbiology: Recent Results (from the past 240 hour(s))  Respiratory Panel by PCR     Status: Abnormal   Collection Time: 07/03/18  7:36 AM  Result Value Ref Range Status   Adenovirus NOT DETECTED NOT DETECTED Final   Coronavirus 229E NOT DETECTED NOT DETECTED Final    Comment: (NOTE) The Coronavirus on the Respiratory Panel, DOES NOT test for the novel  Coronavirus (2019 nCoV)    Coronavirus HKU1 NOT DETECTED NOT DETECTED Final   Coronavirus NL63 NOT DETECTED NOT DETECTED Final   Coronavirus OC43 NOT DETECTED NOT DETECTED Final   Metapneumovirus NOT DETECTED NOT DETECTED Final   Rhinovirus / Enterovirus NOT DETECTED NOT DETECTED Final   Influenza A NOT DETECTED NOT DETECTED Final   Influenza B NOT DETECTED NOT DETECTED Final   Parainfluenza Virus 1 NOT DETECTED NOT DETECTED Final   Parainfluenza Virus 2 NOT DETECTED NOT DETECTED Final   Parainfluenza Virus 3 NOT DETECTED NOT DETECTED Final   Parainfluenza Virus 4 NOT DETECTED NOT DETECTED Final   Respiratory  Syncytial Virus DETECTED (A) NOT DETECTED Final    Comment: CRITICAL RESULT CALLED TO, READ BACK BY AND VERIFIED WITH: Zenia Resides RN 10:30 07/03/18 ([email or web address])    Bordetella pertussis NOT DETECTED NOT DETECTED Final   Chlamydophila pneumoniae NOT DETECTED NOT DETECTED Final   Mycoplasma pneumoniae NOT DETECTED NOT DETECTED Final    Comment: Performed at [facility], [address] [place], [place] [address]     Labs: BNP (last 3 results) Recent Labs    04/10/18 1047 05/03/18 1702 07/01/18 1320  BNP 129.0* 207.8* 227.0*   Basic Metabolic Panel: Recent Labs  Lab 07/01/18 1320 07/01/18 2038 07/03/18 0353 07/04/18 0407 07/05/18 0237  NA 135 136 140 139 139  K 5.4* 5.4* 4.4 5.0 3.7  CL 92* 95* 96* 95* 94*  CO2 30 32 33* 33* 37*  GLUCOSE 424* 351* 219* 267* 311*  BUN 30* 32* 43* 52* 64*  CREATININE 1.65* 1.77* 1.88* 1.80* 1.96*  CALCIUM 9.5 9.4 9.4 9.3 9.3  MG  --  2.0  --   --  2.1   Liver Function Tests: No results for input(s): AST, ALT, ALKPHOS, BILITOT, PROT, ALBUMIN in the last 168 hours. No results for input(s): LIPASE, AMYLASE in the last 168 hours. No results for input(s): AMMONIA in the last 168 hours. CBC: Recent Labs  Lab 07/01/18 1320 07/03/18 0353 07/05/18 0237  WBC 9.6 10.3 7.6  NEUTROABS  --   --  6.5  HGB 10.5* 10.0* 9.2*  HCT 34.8* 31.8* 30.6*  MCV 100.0 97.5 96.5  PLT 206 221 209   Cardiac Enzymes: No results for input(s): CKTOTAL, CKMB, CKMBINDEX, TROPONINI in the last 168 hours. BNP: Invalid input(s): POCBNP CBG: Recent Labs  Lab 07/04/18 1153 07/04/18 1714 07/04/18 2145 07/05/18 0500 07/05/18 0757  GLUCAP 355* 344* 340* 275* 308*   D-Dimer No results for input(s): DDIMER in the last 72 hours. Hgb A1c Recent Labs    07/05/18 0237  HGBA1C 8.4*   Lipid Profile No results for input(s): CHOL, HDL, LDLCALC, TRIG, CHOLHDL, LDLDIRECT in the last 72 hours. Thyroid function studies No results for input(s): TSH, T4TOTAL, T3FREE,  THYROIDAB in the last 72 hours.  Invalid input(s): FREET3 Anemia work up No results for input(s): VITAMINB12, FOLATE, FERRITIN, TIBC, IRON, RETICCTPCT in the last 72 hours. Urinalysis    Component Value Date/Time   COLORURINE YELLOW 11/28/2017 0803   APPEARANCEUR CLEAR 11/28/2017 0803   APPEARANCEUR Clear 11/20/2016 1409   LABSPEC 1.008 11/28/2017 0803   PHURINE 5.0 11/28/2017 0803   GLUCOSEU NEGATIVE 11/28/2017 0803   HGBUR NEGATIVE 11/28/2017 0803   BILIRUBINUR NEGATIVE 11/28/2017 0803   BILIRUBINUR Negative 11/20/2016 1409   KETONESUR NEGATIVE 11/28/2017 0803   PROTEINUR NEGATIVE 11/28/2017 0803   NITRITE NEGATIVE 11/28/2017 0803   LEUKOCYTESUR NEGATIVE 11/28/2017 0803   LEUKOCYTESUR Negative 11/20/2016 1409   Sepsis Labs Invalid input(s): PROCALCITONIN,  WBC,  LACTICIDVEN Microbiology Recent Results (from the past 240 hour(s))  Respiratory Panel by PCR     Status: Abnormal   Collection Time: 07/03/18  7:36 AM  Result Value Ref Range Status   Adenovirus NOT DETECTED NOT DETECTED Final   Coronavirus 229E NOT DETECTED NOT DETECTED Final    Comment: (NOTE) The Coronavirus on the Respiratory Panel, DOES NOT test for the novel  Coronavirus (2019 nCoV)    Coronavirus HKU1 NOT DETECTED NOT DETECTED Final   Coronavirus NL63 NOT DETECTED NOT DETECTED Final   Coronavirus OC43 NOT DETECTED NOT DETECTED Final   Metapneumovirus NOT DETECTED NOT DETECTED Final   Rhinovirus / Enterovirus NOT DETECTED NOT DETECTED Final   Influenza A NOT DETECTED NOT DETECTED Final   Influenza B NOT DETECTED NOT DETECTED Final   Parainfluenza Virus 1 NOT DETECTED NOT DETECTED Final   Parainfluenza Virus 2 NOT DETECTED NOT DETECTED Final   Parainfluenza Virus 3 NOT DETECTED NOT DETECTED Final  Parainfluenza Virus 4 NOT DETECTED NOT DETECTED Final   Respiratory Syncytial Virus DETECTED (A) NOT DETECTED Final    Comment: CRITICAL RESULT CALLED TO, READ BACK BY AND VERIFIED WITH: Zenia Resides RN 10:30  07/03/18 ([email or web address])    Bordetella pertussis NOT DETECTED NOT DETECTED Final   Chlamydophila pneumoniae NOT DETECTED NOT DETECTED Final   Mycoplasma pneumoniae NOT DETECTED NOT DETECTED Final    Comment: Performed at [facility], [address] [place], [place] [address]     Time coordinating discharge: 35 minutes  SIGNED:   Glade Lloyd, MD  [facility] 07/05/2018, 9:26 AM

## 2018-07-05 NOTE — Telephone Encounter (Signed)
Verbal orders given to Crystal with [facility] to start hospice care per Dr. Patsy Lager.

## 2018-07-05 NOTE — Progress Notes (Signed)
Physical Therapy Treatment Patient Details Name: Timothy TORIAN Sr. MRN: [id number] DOB: [birth year] Today's Date: 07/05/2018    History of Present Illness 85 year old male with history of CAD, CABG, A. fib, mild dementia, COPD, chronic hypoxic respiratory failure on home oxygen, chronic diastolic CHF, sleep apnea not on CPAP who was recently admitted on 07/01/2018 for COPD exacerbation and had signed out AGAINST MEDICAL ADVICE on 07/03/2018, respiratory virus panel was positive for RSV.  He presented back to the hospital on the same day on 07/03/2018 with worsening shortness of breath and was admitted again for COPD exacerbation.    PT Comments    Pt agreeable to participate.  Emphasis on transitions to sitting, sit to stand, transfers and gait to bathroom with the RW.   Follow Up Recommendations  Other (comment)(Home with hospice services)     Equipment Recommendations       Recommendations for Other Services       Precautions / Restrictions Precautions Precautions: Fall Restrictions Weight Bearing Restrictions: No Other Position/Activity Restrictions: BLE weakness, watch for legs buckling     Mobility  Bed Mobility Overal bed mobility: Needs Assistance Bed Mobility: Supine to Sit     Supine to sit: Min guard;Min assist        Transfers Overall transfer level: Needs assistance Equipment used: Rolling walker (2 wheeled) Transfers: Sit to/from Stand Sit to Stand: Min guard;Min assist         General transfer comment: MinA to boost to full standing position and steady prior to ambulation   Ambulation/Gait Ambulation/Gait assistance: Min assist Gait Distance (Feet): 20 Feet(or more x2 to/from the bathroom) Assistive device: Rolling walker (2 wheeled) Gait Pattern/deviations: Step-through pattern;Decreased stride length   Gait velocity interpretation: <1.31 ft/sec, indicative of household ambulator General Gait Details: mildly unsteady, short step pattern with  moderate use of the RW.  Moderate dyspnea, likely sats were dropping, but no dynamap available.  The tremors/jerking seen last session was much less.  Pt appeared to have more control overall.   Stairs             Wheelchair Mobility    Modified Rankin (Stroke Patients Only)       Balance Overall balance assessment: Needs assistance;History of Falls Sitting-balance support: Single extremity supported;Feet supported;Bilateral upper extremity supported Sitting balance-Leahy Scale: Good Sitting balance - Comments: min guard for safety considering tremulousness    Standing balance support: Bilateral upper extremity supported;During functional activity Standing balance-Leahy Scale: Poor Standing balance comment: heavy reliance on B UE support, very shaky and weak on initial stand. improved with time.                            Cognition Arousal/Alertness: Awake/alert Behavior During Therapy: WFL for tasks assessed/performed Overall Cognitive Status: History of cognitive impairments - at baseline                                 General Comments: able to gather his thoughts better today compared to yesterday.       Exercises      General Comments        Pertinent Vitals/Pain Pain Assessment: Faces Faces Pain Scale: Hurts a little bit Pain Location: generalized with movement Pain Intervention(s): Monitored during session    Home Living  Prior Function            PT Goals (current goals can now be found in the care plan section) Acute Rehab PT Goals Patient Stated Goal: get stronger, stop falling  PT Goal Formulation: With patient Time For Goal Achievement: 07/18/18 Potential to Achieve Goals: Fair Progress towards PT goals: Progressing toward goals    Frequency    Min 2X/week      PT Plan Current plan remains appropriate    Co-evaluation PT/OT/SLP Co-Evaluation/Treatment: Yes Reason for  Co-Treatment: For patient/therapist safety PT goals addressed during session: Mobility/safety with mobility OT goals addressed during session: ADL's and self-care      AM-PAC PT "6 Clicks" Mobility   Outcome Measure  Help needed turning from your back to your side while in a flat bed without using bedrails?: A Little Help needed moving from lying on your back to sitting on the side of a flat bed without using bedrails?: A Little Help needed moving to and from a bed to a chair (including a wheelchair)?: A Little Help needed standing up from a chair using your arms (e.g., wheelchair or bedside chair)?: A Little Help needed to walk in hospital room?: A Little Help needed climbing 3-5 steps with a railing? : A Lot 6 Click Score: 17    End of Session Equipment Utilized During Treatment: Oxygen Activity Tolerance: Patient tolerated treatment well;Patient limited by fatigue Patient left: in chair;with call bell/phone within reach;with chair alarm set Nurse Communication: Mobility status PT Visit Diagnosis: Unsteadiness on feet (R26.81);Muscle weakness (generalized) (M62.81);Difficulty in walking, not elsewhere classified (R26.2)     Time: 8119-1478 PT Time Calculation (min) (ACUTE ONLY): 32 min  Charges:  $Gait Training: 8-22 mins                     07/05/2018  [place], PT Acute Rehabilitation Services [phone]  (pager) [phone]  (office)   Timothy Cordova 07/05/2018, 3:31 PM

## 2018-07-05 NOTE — TOC Transition Note (Signed)
Transition of Care [facility]) - CM/SW Discharge Note   Patient Details  Name: FODAY BRADEN Sr. MRN: [id number] Date of Birth: [birth year]  Transition of Care [facility]) CM/SW Contact:  Epifanio Lesches, RN Phone Number: 07/05/2018, 12:23 PM   Clinical Narrative:    NCM received call from [facility] / Vernona Rieger. Vernona Rieger stated agency will call pt's grandaughter Victorino Dike today to setup intake appointment. NCM made MD, bedside nurse, and granddaughter Victorino Dike aware. Victorino Dike stated grandmother will now have help from their son with taking care of pt. Wife and son to pick pt up for transportation to home.  Final next level of care: Home w Hospice Care Barriers to Discharge: Other (comment)(pt d/c with hospice care)   Patient Goals and CMS Choice Patient states their goals for this hospitalization and ongoing recovery are:: i want to go home CMS Medicare.gov Compare Post Acute Care list provided to:: Patient Choice offered to / list presented to : Patient, Spouse(Granddaughter Victorino Dike )  Discharge Placement                       Discharge Plan and Services In-house Referral: Hospice / Palliative Care Discharge Planning Services: CM Consult Post Acute Care Choice: NA          DME Arranged: N/A DME Agency: NA HH Arranged: NA HH Agency: [facility]   Social Determinants of Health (SDOH) Interventions     Readmission Risk Interventions No flowsheet data found.

## 2018-07-05 NOTE — TOC Transition Note (Signed)
Transition of Care [facility]) - CM/SW Discharge Note   Patient Details  Name: Timothy MORIARITY Sr. MRN: [id number] Date of Birth: Jun 05, 1928  Transition of Care [facility]) CM/SW Contact:  Timothy Lesches, RN Phone Number: 07/05/2018, 9:40 AM   Clinical Narrative: Admitted with RSV, hx of dementia, chronic diastolic congestive heart failure status post CABG, A. fib, CAD, Beatties mellitus type II. Recently left hospital AMA today after he was admitted for COPD exacerbation.From home with with wife. Has supportive granddaughter. DME: oxygen/[facility]).  Pt to d/c to home today with hospice care. [facility]/ [facility] [phone]) to arrange intake time with pt/family today. Granddaughter states Timothy Cordova( family friend ) to provide transportation to home. Portable oxygen tank will be brought in from home for transportation to home.   Timothy Cordova (Spouse) Timothy Cordova [phone]       Final next level of care: Home w Hospice Care Barriers to Discharge: Other (comment)(pt d/c with hospice care)   Patient Goals and CMS Choice Patient states their goals for this hospitalization and ongoing recovery are:: i want to go home CMS Medicare.gov Compare Post Acute Care list provided to:: Patient Choice offered to / list presented to : Patient, Spouse(Granddaughter Victorino Dike )                      Discharge Plan and Services In-house Referral: Hospice / Palliative Care Discharge Planning Services: CM Consult Post Acute Care Choice: NA          DME Arranged: N/A DME Agency: NA HH Arranged: NA HH Agency: [facility]   Social Determinants of Health (SDOH) Interventions     Readmission Risk Interventions No flowsheet data found.

## 2018-07-05 NOTE — Telephone Encounter (Signed)
Verbal orders - completely agree please call them

## 2018-07-05 NOTE — Telephone Encounter (Signed)
Attempted call and LMOM

## 2018-07-05 NOTE — Progress Notes (Signed)
New referral for hospice at home received.  I will be the clinical nurse liaison involved in discharge home.  We are awaiting feed back from our medical director regarding availability of hospice assessment.  Please call this RN with any needs.  Norris Cross, RN Clinical Nurse Liaison [facility] [phone]

## 2018-07-05 NOTE — Progress Notes (Signed)
Palliative Medicine RN Note: Rec'd calls from pt's granddaughter & [facility]. There are now concerns about granddaughter not being able to help d/t pt's dx of RSV (granddaughter has young kids) & when [facility] will be able to admit. RNCM Marylene Land is aware and working on disposition concerns.   PMT is available prn.  Margret Chance Corrion Stirewalt, RN, BSN, [facility] Palliative Medicine Team 07/05/2018 11:43 AM Office [phone]

## 2018-07-05 NOTE — Progress Notes (Signed)
Occupational Therapy Treatment Patient Details Name: Timothy Cordova. MRN: [id number] DOB: 07/26/1928 Today's Date: 07/05/2018    History of present illness 85 year old male with history of CAD, CABG, A. fib, mild dementia, COPD, chronic hypoxic respiratory failure on home oxygen, chronic diastolic CHF, sleep apnea not on CPAP who was recently admitted on 07/01/2018 for COPD exacerbation and had signed out AGAINST MEDICAL ADVICE on 07/03/2018, respiratory virus panel was positive for RSV.  He presented back to the hospital on the same day on 07/03/2018 with worsening shortness of breath and was admitted again for COPD exacerbation.   OT comments  Pt progressing well towards acute OT goals. Tolerated session better today than yesterday though some balance deficits still noted. Pt able to walk to/from bathroom, toilet transfer, and pericare with min guard to min A +1. Discussed arranging some day to day assistance for basic ADLs that require pt to be on his feet or mobilizing. Pt mentioned having a lot of friends he feels like he can call on to help. D/c plan updated as below.    Follow Up Recommendations  Other (comment)(Home with Hospice and HH aide; assist for mobility/transfers)    Equipment Recommendations  Other (comment)(defer to next setting as pt is d/cing at end of session)    Recommendations for Other Services      Precautions / Restrictions Precautions Precautions: Fall Restrictions Weight Bearing Restrictions: No Other Position/Activity Restrictions: BLE weakness, watch for legs buckling        Mobility Bed Mobility Overal bed mobility: Needs Assistance Bed Mobility: Supine to Sit     Supine to sit: Min guard;Min assist        Transfers Overall transfer level: Needs assistance Equipment used: Rolling walker (2 wheeled) Transfers: Sit to/from Stand Sit to Stand: Min guard;Min assist         General transfer comment: MinA to boost to full standing position  and steady prior to ambulation     Balance Overall balance assessment: Needs assistance;History of Falls Sitting-balance support: Single extremity supported;Feet supported;Bilateral upper extremity supported Sitting balance-Leahy Scale: Good Sitting balance - Comments: min guard for safety considering tremulousness    Standing balance support: Bilateral upper extremity supported;During functional activity Standing balance-Leahy Scale: Poor Standing balance comment: heavy reliance on B UE support, very shaky and weak on initial stand. improved with time.                           ADL either performed or assessed with clinical judgement   ADL                           Toilet Transfer: Min guard;Ambulation;Comfort height toilet;RW;Grab bars;Minimal assistance           Functional mobility during ADLs: Minimal assistance;Rolling walker General ADL Comments: Pt walked to/from bathroom and completed toilet transfer and pericare.     Vision       Perception     Praxis      Cognition Arousal/Alertness: Awake/alert Behavior During Therapy: WFL for tasks assessed/performed Overall Cognitive Status: History of cognitive impairments - at baseline                                 General Comments: able to gather his thoughts better today compared to yesterday.         Exercises  Shoulder Instructions       General Comments      Pertinent Vitals/ Pain       Pain Assessment: Faces Faces Pain Scale: Hurts a little bit Pain Location: generalized with movement Pain Intervention(s): Monitored during session  Home Living                                          Prior Functioning/Environment              Frequency  Min 2X/week        Progress Toward Goals  OT Goals(current goals can now be found in the care plan section)  Progress towards OT goals: Progressing toward goals  Acute Rehab OT Goals Patient  Stated Goal: get stronger, stop falling  OT Goal Formulation: With patient Time For Goal Achievement: 07/18/18 Potential to Achieve Goals: Good ADL Goals Pt Will Perform Grooming: with modified independence;sitting;standing Pt Will Perform Lower Body Bathing: with modified independence;sit to/from stand Pt Will Perform Lower Body Dressing: with modified independence;sit to/from stand Pt Will Transfer to Toilet: with min guard assist;ambulating Pt Will Perform Toileting - Clothing Manipulation and hygiene: with min guard assist;sit to/from stand  Plan Discharge plan needs to be updated    Co-evaluation    PT/OT/SLP Co-Evaluation/Treatment: Yes Reason for Co-Treatment: For patient/therapist safety   OT goals addressed during session: ADL's and self-care;Proper use of [employer] equipment and DME      AM-PAC OT "6 Clicks" Daily Activity     Outcome Measure   Help from another person eating meals?: None Help from another person taking care of personal grooming?: A Little Help from another person toileting, which includes using toliet, bedpan, or urinal?: A Little Help from another person bathing (including washing, rinsing, drying)?: A Little Help from another person to put on and taking off regular upper body clothing?: A Little Help from another person to put on and taking off regular lower body clothing?: A Little 6 Click Score: 19    End of Session Equipment Utilized During Treatment: Gait belt;Rolling walker;Other (comment);Oxygen  OT Visit Diagnosis: Unsteadiness on feet (R26.81);History of falling (Z91.81);Muscle weakness (generalized) (M62.81);Other symptoms and signs involving cognitive function   Activity Tolerance Patient tolerated treatment well;Patient limited by fatigue   Patient Left in chair;with call bell/phone within reach;with chair alarm set;Other (comment)   Nurse Communication          Time: 2423-5361 OT Time Calculation (min): 32 min  Charges: OT  General Charges $OT Visit: 1 Visit OT Treatments $Self Care/Home Management : 8-22 mins  Raynald Kemp, OT Acute Rehabilitation Services Pager: [phone] Office: [phone]    Pilar Grammes 07/05/2018, 1:58 PM

## 2018-07-05 NOTE — TOC Progression Note (Addendum)
Transition of Care (TOC) - Progression Note    Patient Details  Name: Timothy BEAZLEY Sr. MRN: [id number] Date of Birth: Jul 04, 1928  Transition of Care [facility]) CM/SW Contact  Epifanio Lesches, RN Phone Number: 07/05/2018, 10:44 AM  Clinical Narrative:    NCM received call from [facility] ( [phone]). [facility] stated not sure when intake assessment will be done 2/2 pt with RSV, awaiting call back from  her director. [facility] stated will f/u with NCM. NCM made MD aware.   Expected Discharge Plan: Home w Hospice Care Barriers to Discharge: Other (comment)(pt d/c with hospice care)  Expected Discharge Plan and Services Expected Discharge Plan: Home w Hospice Care In-house Referral: Hospice / Palliative Care Discharge Planning Services: CM Consult Post Acute Care Choice: NA Living arrangements for the past 2 months: Single Family Home Expected Discharge Date: 07/05/18               DME Arranged: N/A DME Agency: NA HH Arranged: NA HH Agency: [facility] [place]   Social Determinants of Health (SDOH) Interventions    Readmission Risk Interventions No flowsheet data found.

## 2018-07-05 NOTE — TOC Progression Note (Addendum)
Transition of Care (TOC) - Progression Note    Patient Details  Name: WISE MOTHERWAY Sr. MRN: [id number] Date of Birth: [birth year]  Transition of Care [facility]) CM/SW Contact  Epifanio Lesches, RN Phone Number: 07/05/2018, 10:19 AM  Clinical Narrative:    NCM received call from granddaughter Victorino Dike. Victorino Dike states grandmother anxious about husband d/c to home, unable to care for him alone. Victorino Dike states  also she's unable to assist now 2/2 to pt 's RSV diagnosis and children @ home. NCM spoke with pt. Pt states would like to go home and for granddaughter to contact family friends, Valentino Saxon and Ilda Basset. Pt states they will assist with caring for him with wife.  Expected Discharge Plan: Home w Hospice Care Barriers to Discharge: Other (comment)(pt d/c with hospice care)  Expected Discharge Plan and Services Expected Discharge Plan: Home w Hospice Care In-house Referral: Hospice / Palliative Care Discharge Planning Services: CM Consult Post Acute Care Choice: NA Living arrangements for the past 2 months: Single Family Home Expected Discharge Date: 07/05/18               DME Arranged: N/A DME Agency: NA HH Arranged: NA HH Agency: [facility]   Social Determinants of Health (SDOH) Interventions    Readmission Risk Interventions No flowsheet data found.

## 2018-07-07 DIAGNOSIS — J449 Chronic obstructive pulmonary disease, unspecified: Secondary | ICD-10-CM | POA: Diagnosis not present

## 2018-07-07 DIAGNOSIS — J9601 Acute respiratory failure with hypoxia: Secondary | ICD-10-CM | POA: Diagnosis not present

## 2018-07-07 DIAGNOSIS — I272 Pulmonary hypertension, unspecified: Secondary | ICD-10-CM | POA: Diagnosis not present

## 2018-07-07 DIAGNOSIS — I5032 Chronic diastolic (congestive) heart failure: Secondary | ICD-10-CM | POA: Diagnosis not present

## 2018-07-08 NOTE — Telephone Encounter (Signed)
Talked about situation, refilled her florinef.

## 2018-07-24 ENCOUNTER — Ambulatory Visit (INDEPENDENT_AMBULATORY_CARE_PROVIDER_SITE_OTHER): Payer: Medicare PPO | Admitting: Family Medicine

## 2018-07-24 ENCOUNTER — Encounter: Payer: Self-pay | Admitting: Family Medicine

## 2018-07-24 VITALS — BP 157/58 | HR 78 | Ht 66.0 in | Wt 173.0 lb

## 2018-07-24 DIAGNOSIS — N183 Chronic kidney disease, stage 3 unspecified: Secondary | ICD-10-CM

## 2018-07-24 DIAGNOSIS — J9611 Chronic respiratory failure with hypoxia: Secondary | ICD-10-CM | POA: Insufficient documentation

## 2018-07-24 DIAGNOSIS — I4891 Unspecified atrial fibrillation: Secondary | ICD-10-CM | POA: Diagnosis not present

## 2018-07-24 DIAGNOSIS — I25810 Atherosclerosis of coronary artery bypass graft(s) without angina pectoris: Secondary | ICD-10-CM | POA: Diagnosis not present

## 2018-07-24 DIAGNOSIS — J432 Centrilobular emphysema: Secondary | ICD-10-CM

## 2018-07-24 DIAGNOSIS — I4892 Unspecified atrial flutter: Secondary | ICD-10-CM | POA: Diagnosis not present

## 2018-07-24 DIAGNOSIS — Z7189 Other specified counseling: Secondary | ICD-10-CM | POA: Diagnosis not present

## 2018-07-24 HISTORY — DX: Chronic respiratory failure with hypoxia: J96.11

## 2018-07-24 MED ORDER — MOMETASONE FUROATE 220 MCG/INH IN AEPB: 2.0000 | INHALATION_SPRAY | Freq: Two times a day (BID) | RESPIRATORY_TRACT | 5 refills | Status: DC

## 2018-07-24 NOTE — Progress Notes (Signed)
See Timothy T. Ansen Sayegh, MD Primary Care and Sports Medicine [facility] [address] [place], [id number] Phone: [phone]  FAX: [phone]  Timothy Postinhomas O Puder Sr. - 85 y.o. male  MRN [id number]  Date of Birth: 1929/01/14  Visit Date: 07/24/2018  PCP: Hannah Beatopland, Thailan Sava, MD  Referred by: Hannah Beatopland, Azaylia Fong, MD Chief Complaint  Patient presents with  . Medication Management    Discuss Meds after hospital stay    Virtual Visit via Video Note:  I connected with  Timothy Postinhomas O Wardrop Sr. on 07/24/2018 11:40 AM EDT by a video enabled telemedicine application and verified that I am speaking with the correct person using two identifiers.   Location patient: home computer, tablet, or smartphone Location provider: work or home office Consent: Verbal consent directly obtained from Timothy Sachshomas O Penny Sr.. Persons participating in the virtual visit: patient, provider  I discussed the limitations of evaluation and management by telemedicine and the availability of in person appointments. The patient expressed understanding and agreed to proceed.  Interactive audio and video telecommunications were attempted between this provider and patient, however failed, due to patient having technical difficulties OR patient did not have access to video capability.  We continued and completed visit with audio only.    History of Present Illness:  On hospice, and he actually is feeling better.  Using thickened liquids.  Has all inhalers and oxygen.  He does need an asmanex refill.  Using walker and getting around his house OK.  Generally, he feels better than he has in months  Review of Systems as above: See pertinent positives and pertinent negatives per HPI No acute distress verbally  Past Medical History, Surgical History, Social History, Family History, Problem List, Medications, and Allergies have been reviewed and updated if relevant.    Observations/Objective/Exam:  An attempt was made to discern vital signs over the phone and per patient if applicable and possible.   General:    Alert, Oriented, appears well and in no acute distress HEENT:     Atraumatic, conjunctiva clear, no obvious abnormalities on inspection of external nose and ears.  Neck:    Normal movements of the head and neck Pulmonary:     On inspection no signs of respiratory distress, breathing rate appears normal, no obvious gross SOB, gasping or wheezing Cardiovascular:    No obvious cyanosis Musculoskeletal:    Moves all visible extremities without noticeable abnormality Psych / Neurological:     Pleasant and cooperative, no obvious depression or anxiety, speech and thought processing grossly intact  Assessment and Plan:  Centrilobular emphysema (HCC)  Chronic respiratory failure with hypoxia (HCC)  Hospice patient  Coronary artery disease involving autologous artery coronary bypass graft without angina pectoris  CKD (chronic kidney disease) stage 3, GFR 30-59 ml/min (HCC)  Atrial fibrillation and flutter (HCC)  >15 minutes spent in face to face time with patient, >50% spent in counselling or coordination of care: doing well, complicated medical patient, set up with hospice, needs 1 refill, has plenty of oxygen and support.  I discussed the assessment and treatment plan with the patient. The patient was provided an opportunity to ask questions and all were answered. The patient agreed with the plan and demonstrated an understanding of the instructions.   The patient was advised to call back or seek an in-person evaluation if the symptoms worsen or if the condition fails to improve as anticipated.  Follow-up: prn unless noted otherwise below No follow-ups on  file.  Meds ordered this encounter  Medications  . mometasone (ASMANEX) 220 MCG/INH inhaler    Sig: Inhale 2 puffs into the lungs 2 (two) times daily.    Dispense:  1 Inhaler     Refill:  5   No orders of the defined types were placed in this encounter.   Signed,  Elpidio Galea. Dywane Peruski, MD

## 2018-08-08 DIAGNOSIS — J449 Chronic obstructive pulmonary disease, unspecified: Secondary | ICD-10-CM | POA: Diagnosis not present

## 2018-08-08 DIAGNOSIS — I272 Pulmonary hypertension, unspecified: Secondary | ICD-10-CM | POA: Diagnosis not present

## 2018-08-08 DIAGNOSIS — I5032 Chronic diastolic (congestive) heart failure: Secondary | ICD-10-CM | POA: Diagnosis not present

## 2018-08-08 DIAGNOSIS — J9601 Acute respiratory failure with hypoxia: Secondary | ICD-10-CM | POA: Diagnosis not present

## 2018-08-09 ENCOUNTER — Telehealth: Payer: Self-pay | Admitting: *Deleted

## 2018-08-09 MED ORDER — TIOTROPIUM BROMIDE-OLODATEROL 2.5-2.5 MCG/ACT IN AERS: 2.0000 | INHALATION_SPRAY | Freq: Every day | RESPIRATORY_TRACT | 2 refills | Status: DC

## 2018-08-09 NOTE — Telephone Encounter (Signed)
Mitzie called from Hospice stating that patient's wife called stating that he needs a refill on his Stiolto Respimat (tiotropium)  inhaler. Mitzie stated that she checked with [facility] and they have no record of patient ever getting this inhaler because their records only go back two years. Checking back it looks like it was put on his list back in 08/31/15 office visit with Dr. Para March. Mitzie stated that she feels that the patient just found an old inhaler and started back using it. Mitzie wants to know if Dr. Patsy Lager wants to refill it since patient may benefit from it? [facility]acy [facility] Please advise

## 2018-08-09 NOTE — Telephone Encounter (Signed)
On chart review it looks like in past he had good benefit from stiolto .. see 2017 pulm note. I see no reason not to use it along with his other medication especially if he or wife feels it is helpful with SOB, wheeze. Refilled med as requested.

## 2018-08-09 NOTE — Telephone Encounter (Signed)
Mitzie notified as instructed by telephone.

## 2018-08-09 NOTE — Telephone Encounter (Signed)
Timothy Cordova, there is no way I'll be able to handle this. We are completely away from computer access until likely tomorrow. Is there anyway that you could check this out and have Amy help address this.

## 2018-08-27 ENCOUNTER — Other Ambulatory Visit: Payer: Self-pay | Admitting: Family Medicine

## 2018-08-27 NOTE — Telephone Encounter (Signed)
Reasonable for symptoms, bp should be able to handle it

## 2018-08-27 NOTE — Telephone Encounter (Signed)
Last visit 07/24/2018.  Tamsulosin not on current medication list.  Refill?

## 2018-09-08 DIAGNOSIS — I272 Pulmonary hypertension, unspecified: Secondary | ICD-10-CM | POA: Diagnosis not present

## 2018-09-08 DIAGNOSIS — I5032 Chronic diastolic (congestive) heart failure: Secondary | ICD-10-CM | POA: Diagnosis not present

## 2018-09-08 DIAGNOSIS — J9601 Acute respiratory failure with hypoxia: Secondary | ICD-10-CM | POA: Diagnosis not present

## 2018-09-08 DIAGNOSIS — J449 Chronic obstructive pulmonary disease, unspecified: Secondary | ICD-10-CM | POA: Diagnosis not present

## 2018-09-16 ENCOUNTER — Telehealth: Payer: Self-pay | Admitting: *Deleted

## 2018-09-16 MED ORDER — MORPHINE SULFATE (CONCENTRATE) 10 MG /0.5 ML PO SOLN: 5.0000 mg | ORAL | 0 refills | Status: DC | PRN

## 2018-09-16 NOTE — Telephone Encounter (Signed)
[place] nurse called stating that they need a refill sent to pharmacy for his Liquid Morphine. Mitzie stated that he was getting it while he was at the [facility] and what they have on file is Liquid Morphine concentrate 5 mg/0.25 ml, take every hour as needed for pain, SOB, anxiety. Mitzie is requesting 30 ml and wanted the doctor to know that patent is usually only taking this once a day. Please confirm dose and directions. [facility]

## 2018-09-16 NOTE — Telephone Encounter (Signed)
Refilled, currently taking without difficulties

## 2018-09-19 ENCOUNTER — Ambulatory Visit

## 2018-09-23 ENCOUNTER — Ambulatory Visit (INDEPENDENT_AMBULATORY_CARE_PROVIDER_SITE_OTHER): Payer: Medicare PPO | Admitting: Family Medicine

## 2018-09-23 ENCOUNTER — Encounter: Payer: Self-pay | Admitting: Family Medicine

## 2018-09-23 VITALS — Ht 66.0 in | Wt 173.0 lb

## 2018-09-23 DIAGNOSIS — Z0000 Encounter for general adult medical examination without abnormal findings: Secondary | ICD-10-CM | POA: Diagnosis not present

## 2018-09-23 DIAGNOSIS — J9611 Chronic respiratory failure with hypoxia: Secondary | ICD-10-CM | POA: Diagnosis not present

## 2018-09-23 DIAGNOSIS — E1121 Type 2 diabetes mellitus with diabetic nephropathy: Secondary | ICD-10-CM | POA: Diagnosis not present

## 2018-09-23 DIAGNOSIS — I25810 Atherosclerosis of coronary artery bypass graft(s) without angina pectoris: Secondary | ICD-10-CM | POA: Diagnosis not present

## 2018-09-23 DIAGNOSIS — Z7189 Other specified counseling: Secondary | ICD-10-CM

## 2018-09-23 DIAGNOSIS — J432 Centrilobular emphysema: Secondary | ICD-10-CM

## 2018-09-23 NOTE — Progress Notes (Signed)
[name], MD [facility] and [place] [facility] [place], [address] Phone: [phone]  FAX: [place] - 85 y.o. male  MRN [id number]  Date of Birth: 10-Sep-1928  Visit Date: 09/23/2018  PCP: Owens Loffler, MD  Referred by: Owens Loffler, MD  Virtual Visit via Telephone Note:  I connected with  Timothy Lor Sr. on 09/23/2018 10:20 AM EDT by telephone and verified that I am speaking with the correct person using two identifiers.   Location patient: home phone or cell phone Location provider: work or home office Consent: Verbal consent directly obtained from [place]. and that there may be a patient responsible charge related to this service. Persons participating in the virtual visit: patient, provider  I discussed the limitations of evaluation and management by telemedicine and the availability of in person appointments.  The patient expressed understanding and agreed to proceed.     History of Present Illness:  Health Maintenance  Topic Date Due  . FOOT EXAM  07/03/2017  . OPHTHALMOLOGY EXAM  09/06/2018  . INFLUENZA VACCINE  11/09/2018  . HEMOGLOBIN A1C  01/05/2019  . TETANUS/TDAP  04/25/2025  . PNA vac Low Risk Adult  Completed    Immunization History  Administered Date(s) Administered  . Influenza Split 03/21/2011, 01/09/2012  . Influenza Whole 01/30/2005  . Influenza, High Dose Seasonal PF 01/03/2016, 12/12/2017  . Pneumococcal Conjugate-13 08/19/2014  . Pneumococcal Polysaccharide-23 04/11/2003  . Td 04/11/2003, 04/26/2015  . Zoster 12/17/2007, 12/18/2007    He is currently on hospice with chronic respiratory failure and end-stage COPD:  Feeling weaker and weaker. More SOB. 2 L. 4 x a day of Duonebs only He feels like he is doing better than when in and out of the hospital  1 fall last week.  Also with CAD, CKD, multiple other medical  problems  FALL RISK AND USE WALKER - as above  Recent labs all reviewed.  Lab Results  Component Value Date   HGBA1C 8.4 (H) 07/05/2018    Lab Results  Component Value Date   WBC 7.6 07/05/2018   HGB 9.2 (L) 07/05/2018   HCT 30.6 (L) 07/05/2018   PLT 209 07/05/2018   GLUCOSE 311 (H) 07/05/2018   CHOL 133 09/10/2017   TRIG 172.0 (H) 09/10/2017   HDL 30.60 (L) 09/10/2017   LDLCALC 68 09/10/2017   ALT 18 04/11/2018   AST 15 04/11/2018   NA 139 07/05/2018   K 3.7 07/05/2018   CL 94 (L) 07/05/2018   CREATININE 1.96 (H) 07/05/2018   BUN 64 (H) 07/05/2018   CO2 37 (H) 07/05/2018   TSH 0.453 11/28/2017   PSA 5.52 (H) 06/26/2016   INR 1.0 04/23/2013   HGBA1C 8.4 (H) 07/05/2018   MICROALBUR 6.8 (H) 06/26/2016    Relatively baseline  Review of Systems: pertinent positives and pertinent negatives as per HPI No acute distress verbally Otherwise, the pertinent positives and negatives are listed above and in the HPI, otherwise a full 14 point review of systems has been reviewed and is negative unless noted positive.   Past Medical History, Surgical History, Social History, Family History, Problem List, Medications, and Allergies have been reviewed and updated if relevant.   Patient Active Problem List   Diagnosis Date Noted  . Chronic respiratory failure with hypoxia ([place]) 07/24/2018    Priority: High  . Hospice patient 05/09/2018    Priority: High  .  CKD (chronic kidney disease) stage 3, GFR 30-59 ml/min (HCC) 08/18/2010    Priority: High  . DIASTOLIC HEART FAILURE, CHRONIC 08/18/2009    Priority: High  . Coronary artery disease involving autologous artery coronary bypass graft without angina pectoris 06/07/2008    Priority: High  . Type 2 diabetes mellitus with diabetic nephropathy, without long-term current use of insulin (HCC) 09/04/2007    Priority: High  . Centrilobular emphysema (HCC) 08/08/2005    Priority: High  . Goals of care, counseling/discussion   . Atrial  fibrillation and flutter (HCC)   . Pulmonary HTN (HCC) 07/30/2017  . Solitary pulmonary nodule 12/10/2015  . Aortic calcification, 04/20/2005 CT Chest 04/03/2013  . Sleep apnea   . Tobacco abuse (in remission)   . Chronic low back pain 04/18/2011  . OSTEOARTHRITIS 12/06/2009  . Mixed hyperlipidemia 06/11/2008  . ALLERGIC RHINITIS 01/30/2007  . HEARING LOSS 07/06/2006  . Essential hypertension 07/06/2006  . BPH (benign prostatic hyperplasia) 07/06/2006    Past Medical History:  Diagnosis Date  . Allergic rhinitis   . Benign prostatic hypertrophy   . CHF (congestive heart failure) (HCC)   . Chronic diastolic heart failure (HCC)    8/08 ECHO with EF 55%  . Chronic respiratory failure with hypoxia (HCC) 07/24/2018  . CKD (chronic kidney disease)   . COPD (chronic obstructive pulmonary disease) (HCC)    moderate. followed by Dr.Byrum  . Coronary artery disease    s/p CABG 2006. LHC (1/10): SVG-OM and PLOM, SVG-D, and LIMA-LAD were all patent  . Dementia (HCC)    (mild)--06/2006  . Diabetes mellitus   . Edema    localized. suspect diastolic CHF + venous insufficiency  . Hearing loss    left >>right  . History of tobacco abuse   . Hypertension   . Mixed hyperlipidemia   . Osteoarthritis   . Pneumonia 07/31/2017  . Sleep apnea    no CPAP    Past Surgical History:  Procedure Laterality Date  . ANGIOPLASTY  [id number]  . CARDIAC [place]  90[place]  . CARDIAC CATHETERIZATION     [facility];x2 stents  . CARDIAC CATHETERIZATION  04/29/2013  . CARDIOVERSION N/A 12/05/2017   Procedure: CARDIOVERSION;  Surgeon: Elease HashimotoNahser, Deloris PingPhilip J, MD;  [facility];  Service: Cardiovascular;  Laterality: N/A;  . CORONARY ARTERY BYPASS GRAFT  02/2005  . ROTATOR CUFF REPAIR  2005   left, Gioffre  . TEE WITHOUT CARDIOVERSION N/A 12/05/2017   Procedure: TRANSESOPHAGEAL ECHOCARDIOGRAM (TEE);  Surgeon: Elease HashimotoNahser, Deloris PingPhilip J, MD;  [facility];  Service: Cardiovascular;   Laterality: N/A;    Social History   Socioeconomic History  . Marital status: Married    Spouse name: Thurston Holenne  . Number of children: Not on file  . Years of education: Not on file  . Highest education level: Not on file  Occupational History  . 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 Financial resource strain: Not on file  . Food insecurity    Worry: Not on file    Inability: Not on file  . Transportation needs    Medical: Not on file    Non-medical: Not on file  Tobacco Use  . Smoking status: Former Smoker    Packs/day: 3.00    Years: 60.00    Pack years: 180.00    Types: Cigarettes    Quit date: 07/12/2005    Years since quitting: 13.2  .  Smokeless tobacco: Never Used  Substance and Sexual Activity  . Alcohol use: No    Alcohol/week: 0.0 standard drinks  . Drug use: No  . Sexual activity: Not on file  Lifestyle  . Physical activity    Days per week: Not on file    Minutes per session: Not on file  . Stress: Not on file  Relationships  . Social Musicianconnections    Talks on phone: Not on file    Gets together: Not on file    Attends religious service: Not on file    Active member of club or organization: Not on file    Attends meetings of clubs or organizations: Not on file    Relationship status: Not on file  . Intimate partner violence    Fear of current or ex partner: Not on file    Emotionally abused: Not on file    Physically abused: Not on file    Forced sexual activity: Not on file  Other Topics Concern  . Not on file  Social History Narrative   Married to wife Shane Crutchnne   Son is Orvilla Fusommy   Granddaughter is active in care   Retired Radio broadcast assistantrubber fabricator      Advanced directives:   Discussed with Timothy Cordova and his wife Thurston Holenne who is HCPOA.  He would like to be made DNR / DNI and he has a [place] DNR/DNI form at his house.  He does not want shock, CPR, defibrillation, or any life support. They have been asked to bring in his  HCPOA documentation [facility]. He does want active treatment of ongoing illness including infection, COPD exacerbations, HF exacerbations, etc.  Electronically Signed  By: Hannah BeatSpencer Arvil Utz, MD On: 05/09/2018 6:40 PM     Family History  Problem Relation Age of Onset  . Heart attack Mother   . Lung cancer Sister   . Prostate cancer Neg Hx   . Kidney cancer Neg Hx     Allergies  Allergen Reactions  . Metformin And Related Nausea And Vomiting and Rash  . Tramadol Hcl Other (See Comments)    Tremor vs seizure activity  . Codeine Nausea And Vomiting  . Gabapentin Other (See Comments)    "Messed up my nervous system"  . Morphine Sulfate Nausea And Vomiting  . Other Nausea And Vomiting    "Narcotics, in general"  . Ticlopidine Hcl Other (See Comments)    Reaction unknown by patient or family  . Warfarin Sodium Nausea And Vomiting and Other (See Comments)    "Severe fatigue," also  . Xarelto [Rivaroxaban] Other (See Comments)    Weak joints (falls)  . Ace Inhibitors Rash and Cough  . Tramadol Rash    Medication list reviewed and updated in full in [facility].   Observations/Objective/Exam:  An attempt was made to discern vital signs over the phone and per patient if applicable and possible.   Pulmonary:     Effort: Pulmonary effort is normal. No respiratory distress.  Neurological:     Mental Status: He is alert and oriented to person, place, and time.  Psychiatric:        Thought Content: Thought content normal.        Judgment: Judgment normal.   Assessment and Plan:    ICD-10-CM   1. Healthcare maintenance  Z00.00   2. Chronic respiratory failure with hypoxia (HCC)  J96.11   3. Coronary artery disease involving autologous artery coronary bypass graft without angina pectoris  I25.810   4. Centrilobular emphysema (HCC)  J43.2   5. Type 2 diabetes mellitus with diabetic nephropathy, without long-term current use of insulin (HCC)  E11.21   6. Hospice  patient  Z71.89    Continue with current plan of care, hospice.  Continues to have end-stage COPD with chronic respiratory failure, chronic oxygen.  Is not having chest pain or other issues, and his blood sugars have been approximately 120 when he is checking them at home.  Reasonably stable and possibly improving hospice patient.  I discussed the assessment and treatment plan with the patient. The patient was provided an opportunity to ask questions and all were answered. The patient agreed with the plan and demonstrated an understanding of the instructions.   The patient was advised to call back or seek an in-person evaluation if the symptoms worsen or if the condition fails to improve as anticipated.  Follow-up: prn unless noted otherwise below No follow-ups on file.  No orders of the defined types were placed in this encounter.  No orders of the defined types were placed in this encounter.   Signed,  Elpidio GaleaSpencer T. Latima Hamza, MD

## 2018-09-23 NOTE — Progress Notes (Signed)
Hearing Screening   125Hz 250Hz 500Hz 1000Hz 2000Hz 3000Hz 4000Hz 6000Hz 8000Hz  Right ear:           Left ear:           Comments: Not checked in the last 12months  Vision Screening Comments: Not checked in the last 12months   

## 2018-10-29 ENCOUNTER — Other Ambulatory Visit: Payer: Self-pay | Admitting: Family Medicine

## 2018-10-29 NOTE — Telephone Encounter (Signed)
Electronic refill request Seroquel Last office visit 09/23/18 Medication is no longer on medication list. Please confirm if medication should be refilled

## 2018-10-29 NOTE — Telephone Encounter (Signed)
Called and spoke to Timothy Cordova pharmacist at [facility] and was advised that it was prescribed under your name and she stated that the patient is under Hospice care and they probably called it in under your name since you are the PCP. Timothy Cordova stated that this happens all the time. Called and spoke to [facility] [phone]) hospice nurse and was advised that this was prescribed by NP Timothy Cordova at the [facility] when patient was there because he became combative and was given this to calm him down. Timothy Cordova stated that they do usually put it under the PCP's name, but ususally contacts the PCP and makes them aware of it. Timothy Cordova stated that this must have slipped thru the cracks.  Timothy Cordova stated that the patient is doing better and uses this at night to help with sleep. Timothy Cordova stated to let her know if [name] does not want this continued.

## 2018-10-29 NOTE — Telephone Encounter (Signed)
To my knowledge, I have not placed him on Seroquel - it is possible that another physician did, and if that is the case, then the refill request should go to him/her

## 2018-10-30 NOTE — Telephone Encounter (Signed)
Left message on Timothy Cordova's private voicemail that refill has been sent to the pharmacy.

## 2018-11-06 ENCOUNTER — Encounter: Payer: Self-pay | Admitting: *Deleted

## 2018-11-06 ENCOUNTER — Other Ambulatory Visit: Payer: Self-pay

## 2018-11-06 ENCOUNTER — Telehealth: Payer: Self-pay

## 2018-11-06 ENCOUNTER — Emergency Department
Admission: EM | Admit: 2018-11-06 | Discharge: 2018-11-06 | Disposition: A | Discharge status: Home / Self Care | Attending: Emergency Medicine | Admitting: Emergency Medicine

## 2018-11-06 DIAGNOSIS — Z87891 Personal history of nicotine dependence: Secondary | ICD-10-CM | POA: Insufficient documentation

## 2018-11-06 DIAGNOSIS — E1165 Type 2 diabetes mellitus with hyperglycemia: Secondary | ICD-10-CM | POA: Insufficient documentation

## 2018-11-06 DIAGNOSIS — N183 Chronic kidney disease, stage 3 (moderate): Secondary | ICD-10-CM | POA: Diagnosis not present

## 2018-11-06 DIAGNOSIS — I5032 Chronic diastolic (congestive) heart failure: Secondary | ICD-10-CM | POA: Insufficient documentation

## 2018-11-06 DIAGNOSIS — R739 Hyperglycemia, unspecified: Secondary | ICD-10-CM

## 2018-11-06 DIAGNOSIS — E1140 Type 2 diabetes mellitus with diabetic neuropathy, unspecified: Secondary | ICD-10-CM | POA: Diagnosis not present

## 2018-11-06 DIAGNOSIS — E1122 Type 2 diabetes mellitus with diabetic chronic kidney disease: Secondary | ICD-10-CM | POA: Insufficient documentation

## 2018-11-06 DIAGNOSIS — R5381 Other malaise: Secondary | ICD-10-CM | POA: Diagnosis not present

## 2018-11-06 DIAGNOSIS — Z7982 Long term (current) use of aspirin: Secondary | ICD-10-CM | POA: Diagnosis not present

## 2018-11-06 DIAGNOSIS — Z79899 Other long term (current) drug therapy: Secondary | ICD-10-CM | POA: Diagnosis not present

## 2018-11-06 DIAGNOSIS — I130 Hypertensive heart and chronic kidney disease with heart failure and stage 1 through stage 4 chronic kidney disease, or unspecified chronic kidney disease: Secondary | ICD-10-CM | POA: Diagnosis not present

## 2018-11-06 DIAGNOSIS — Z743 Need for continuous supervision: Secondary | ICD-10-CM | POA: Diagnosis not present

## 2018-11-06 DIAGNOSIS — I10 Essential (primary) hypertension: Secondary | ICD-10-CM | POA: Diagnosis not present

## 2018-11-06 DIAGNOSIS — R279 Unspecified lack of coordination: Secondary | ICD-10-CM | POA: Diagnosis not present

## 2018-11-06 DIAGNOSIS — R000 Tachycardia, unspecified: Secondary | ICD-10-CM | POA: Diagnosis not present

## 2018-11-06 DIAGNOSIS — Z7984 Long term (current) use of oral hypoglycemic drugs: Secondary | ICD-10-CM | POA: Diagnosis not present

## 2018-11-06 DIAGNOSIS — Z209 Contact with and (suspected) exposure to unspecified communicable disease: Secondary | ICD-10-CM | POA: Diagnosis not present

## 2018-11-06 DIAGNOSIS — I447 Left bundle-branch block, unspecified: Secondary | ICD-10-CM | POA: Diagnosis not present

## 2018-11-06 LAB — URINALYSIS, COMPLETE (UACMP) WITH MICROSCOPIC
Bacteria, UA: NONE SEEN
Bilirubin Urine: NEGATIVE
Glucose, UA: >=500 mg/dL — AB
Hgb urine dipstick: NEGATIVE
Ketones, ur: NEGATIVE mg/dL
Leukocytes,Ua: NEGATIVE
Nitrite: NEGATIVE
Protein, ur: NEGATIVE mg/dL
Specific Gravity, Urine: 1.008 (ref 1.005–1.030)
pH: 7 (ref 5.0–8.0)

## 2018-11-06 LAB — CBC
HCT: 32.7 % — ABNORMAL LOW (ref 39.0–52.0)
Hemoglobin: 10.3 g/dL — ABNORMAL LOW (ref 13.0–17.0)
MCH: 30.4 pg (ref 26.0–34.0)
MCHC: 31.5 g/dL (ref 30.0–36.0)
MCV: 96.5 fL (ref 80.0–100.0)
Platelets: 188 10*3/uL (ref 150–400)
RBC: 3.39 MIL/uL — ABNORMAL LOW (ref 4.22–5.81)
RDW: 12 % (ref 11.5–15.5)
WBC: 9.3 10*3/uL (ref 4.0–10.5)
nRBC: 0 % (ref 0.0–0.2)

## 2018-11-06 LAB — BASIC METABOLIC PANEL
Anion gap: 10 (ref 5–15)
BUN: 48 mg/dL — ABNORMAL HIGH (ref 8–23)
CO2: 31 mmol/L (ref 22–32)
Calcium: 9.3 mg/dL (ref 8.9–10.3)
Chloride: 94 mmol/L — ABNORMAL LOW (ref 98–111)
Creatinine, Ser: 2.23 mg/dL — ABNORMAL HIGH (ref 0.61–1.24)
GFR calc Af Amer: 29 mL/min — ABNORMAL LOW (ref >60)
GFR calc non Af Amer: 25 mL/min — ABNORMAL LOW (ref >60)
Glucose, Bld: 391 mg/dL — ABNORMAL HIGH (ref 70–99)
Potassium: 4.3 mmol/L (ref 3.5–5.1)
Sodium: 135 mmol/L (ref 135–145)

## 2018-11-06 LAB — GLUCOSE, CAPILLARY: Glucose-Capillary: 376 mg/dL — ABNORMAL HIGH (ref 70–99)

## 2018-11-06 MED ORDER — INSULIN ASPART 100 UNIT/ML ~~LOC~~ SOLN
4.0000 [IU] | Freq: Once | SUBCUTANEOUS | Status: AC
  Administered 2018-11-06: 4 [IU] via INTRAVENOUS
  Filled 2018-11-06: qty 1

## 2018-11-06 MED ORDER — SODIUM CHLORIDE 0.9 % IV BOLUS
250.0000 mL | Freq: Once | INTRAVENOUS | Status: AC
  Administered 2018-11-06: 250 mL via INTRAVENOUS

## 2018-11-06 NOTE — Telephone Encounter (Signed)
Yes, please give verbal order for UA and urine culture

## 2018-11-06 NOTE — Progress Notes (Signed)
ED visit made. Patient is currently followed by [facility] at home with a hospice diagnosis of COPD. He is on 4 liters via nasal canula at baseline. DNR code with out of facility DNR in place in the home. Patient was found to have a blood sugar of 562 at home, primary care provider was contacted by patient's hospice RN and he instructed patient to go to the ED.  Patient seen laying on the ED stretcher, alert and oriented. Able to carry on a conversation and report he was feeling fine. He feels his high blood sugar was from his prednisone and ice cream. He currently takes glipizide 5 mg BID. He also described the falls he has had over the past few months. He says his legs then his hips get weak and he "just falls". He does use a walker at home. Writer spoke with EDP Dr. Burlene Arnt, patient's blood sugar has trended down and he has received a dose of insulin. Urinalysis was negative for UTI. Plan is for discharge home. Patent spoke to his wife on the phone during visit to advise her of discharge. Patient and wife agreeable to EMS transport home at discharge. Staff RN Anderson Malta and EDP made aware. Hospice team updated to discharge.  Flo Shanks BSN, RN, [facility] [phone]

## 2018-11-06 NOTE — Discharge Instructions (Addendum)
Hospice will help you with your sugars at home. Please recheck your bun/creatinine in the next few days.

## 2018-11-06 NOTE — Care Management (Signed)
Notified by Santiago Glad with [facility] that patient is open to hospice services.

## 2018-11-06 NOTE — ED Provider Notes (Addendum)
[facility] Emergency Department Provider Note  ____________________________________________   I have reviewed the triage vital signs and the nursing notes. Where available I have reviewed prior notes and, if possible and indicated, outside hospital notes.   Patient seen and evaluated during the coronavirus epidemic during a time with low staffing  Patient seen for the symptoms described in the history of present illness. She was evaluated in the context of the global COVID-19 pandemic, which necessitated consideration that the patient might be at risk for infection with the SARS-CoV-2 virus that causes COVID-19. Institutional protocols and algorithms that pertain to the evaluation of patients at risk for COVID-19 are in a state of rapid change based on information released by regulatory bodies including the CDC and federal and state organizations. These policies and algorithms were followed during the patient's care in the ED.    HISTORY  Chief Complaint Hyperglycemia    HPI Timothy Postinhomas O Schrager Sr. is a 85 y.o. male sent him here because it was noted that his sugar is high.  He is not had any symptoms he does have polyuria, his states on a regular days sugar can actually go up to 400 after he eats.  Best way always checks his sugar before eating.  This morning however it was over 600 and he came in.  He has recently been on steroids but has been on steroids for some months.  Not sure exactly what he takes at baseline for his diabetes.  Some sort of pill he states.  He denies chest pain shortness of breath fever rash.  He denies dysuria although he is on antibiotics for urinating a lot, he states he is urinating a little less today.  He suspects that perhaps his sugars are in part elevated because of his proclivity towards the enjoyment of a good ice cream sandwich every day.  He does have end-stage COPD, he has chronic diastolic heart failure but a EF of 55%.  He states  that he has had no change in his chronic COPD for which he is on hospice on 4 L of oxygen at home.  Past Medical History:  Diagnosis Date  . Allergic rhinitis   . Benign prostatic hypertrophy   . CHF (congestive heart failure) (HCC)   . Chronic diastolic heart failure (HCC)    8/08 ECHO with EF 55%  . Chronic respiratory failure with hypoxia (HCC) 07/24/2018  . CKD (chronic kidney disease)   . COPD (chronic obstructive pulmonary disease) (HCC)    moderate. followed by Dr.Byrum  . Coronary artery disease    s/p CABG 2006. LHC (1/10): SVG-OM and PLOM, SVG-D, and LIMA-LAD were all patent  . Dementia (HCC)    (mild)--06/2006  . Diabetes mellitus   . Edema    localized. suspect diastolic CHF + venous insufficiency  . Hearing loss    left >>right  . History of tobacco abuse   . Hypertension   . Mixed hyperlipidemia   . Osteoarthritis   . Pneumonia 07/31/2017  . Sleep apnea    no CPAP    Patient Active Problem List   Diagnosis Date Noted  . Chronic respiratory failure with hypoxia (HCC) 07/24/2018  . Hospice patient 05/09/2018  . Goals of care, counseling/discussion   . Atrial fibrillation and flutter (HCC)   . Pulmonary HTN (HCC) 07/30/2017  . Solitary pulmonary nodule 12/10/2015  . Aortic calcification, 04/20/2005 CT Chest 04/03/2013  . Sleep apnea   . Tobacco abuse (in remission)   .  Chronic low back pain 04/18/2011  . CKD (chronic kidney disease) stage 3, GFR 30-59 ml/min (HCC) 08/18/2010  . OSTEOARTHRITIS 12/06/2009  . DIASTOLIC HEART FAILURE, CHRONIC 08/18/2009  . Mixed hyperlipidemia 06/11/2008  . Coronary artery disease involving autologous artery coronary bypass graft without angina pectoris 06/07/2008  . Type 2 diabetes mellitus with diabetic nephropathy, without long-term current use of insulin (HCC) 09/04/2007  . ALLERGIC RHINITIS 01/30/2007  . HEARING LOSS 07/06/2006  . Essential hypertension 07/06/2006  . BPH (benign prostatic hyperplasia) 07/06/2006  .  Centrilobular emphysema (HCC) 08/08/2005    Past Surgical History:  Procedure Laterality Date  . ANGIOPLASTY  [id number]  . CARDIAC [place]HE[place]IZ[place]  90s   [place]  . CARDIAC CATHETERIZATION     [facility];x2 stents  . CARDIAC CATHETERIZATION  04/29/2013  . CARDIOVERSION N/A 12/05/2017   Procedure: CARDIOVERSION;  Surgeon: Elease HashimotoNahser, Deloris PingPhilip J, MD;  [facility];  Service: Cardiovascular;  Laterality: N/A;  . CORONARY ARTERY BYPASS GRAFT  02/2005  . ROTATOR CUFF REPAIR  2005   left, [place]  . TEE WITHOUT CARDIOVERSION N/A 12/05/2017   Procedure: TRANSESOPHAGEAL ECHOCARDIOGRAM (TEE);  Surgeon: Elease HashimotoNahser, Deloris PingPhilip J, MD;  [facility];  Service: Cardiovascular;  Laterality: N/A;    Prior to Admission medications   Medication Sig Start Date End Date Taking? Authorizing Provider  aspirin EC 81 MG tablet Take 1 tablet (81 mg total) by mouth daily. 04/15/18  Yes Copland, Karleen HampshireSpencer, MD  doxazosin (CARDURA) 8 MG tablet Take 0.5 tablets (4 mg total) by mouth at bedtime. 05/22/18  Yes Copland, Karleen HampshireSpencer, MD  furosemide (LASIX) 20 MG tablet Take 1 tablet (20 mg total) by mouth 2 (two) times daily. Patient taking differently: Take 20 mg by mouth daily.  07/05/18  Yes Glade LloydAlekh, Kshitiz, MD  glipiZIDE (GLUCOTROL) 10 MG tablet Take 1 tablet (10 mg total) by mouth 2 (two) times daily before a meal. 06/13/18  Yes Copland, Spencer, MD  ipratropium-albuterol (DUONEB) 0.5-2.5 (3) MG/3ML SOLN Take 3 mLs by nebulization 4 (four) times daily. 05/09/18  Yes Copland, Karleen HampshireSpencer, MD  LORazepam (ATIVAN) 0.5 MG tablet Take 1 tablet (0.5 mg total) by mouth every 8 (eight) hours as needed for anxiety. 07/05/18 07/05/19 Yes Glade LloydAlekh, Kshitiz, MD  metoprolol (TOPROL-XL) 200 MG 24 hr tablet Take 0.5 tablets (100 mg total) by mouth daily. 05/22/18  Yes Copland, Karleen HampshireSpencer, MD  Morphine Sulfate (MORPHINE CONCENTRATE) 10 mg / 0.5 ml concentrated solution Take 0.25 mLs (5 mg total) by mouth every 2 (two) hours as needed for severe pain  or shortness of breath. 09/16/18  Yes Copland, Karleen HampshireSpencer, MD  Multiple Vitamins-Minerals (PRESERVISION AREDS 2) CAPS Take 1 capsule by mouth daily with breakfast.    Yes [provider]  predniSONE (DELTASONE) 10 MG tablet Take 10 mg by mouth daily. 10/28/18  Yes [provider]  QUEtiapine (SEROQUEL) 25 MG tablet TAKE 1 TABLET BY MOUTH AT BEDTIME 10/30/18  Yes Copland, Spencer, MD  rosuvastatin (CRESTOR) 40 MG tablet TAKE 1 TABLET BY MOUTH EVERY DAY 08/27/18  Yes Copland, Karleen HampshireSpencer, MD  tamsulosin (FLOMAX) 0.4 MG CAPS capsule TAKE 1 CAPSULE BY MOUTH DAILY 08/27/18  Yes Copland, Karleen HampshireSpencer, MD  albuterol (PROVENTIL HFA;VENTOLIN HFA) 108 (90 Base) MCG/ACT inhaler Inhale 2 puffs into the lungs every 6 (six) hours as needed for wheezing or shortness of breath. 04/12/18   Rolly SalterPatel, Pranav M, MD  Carboxymethylcellulose Sod PF 0.25 % SOLN Place 2 drops into both eyes 2 (two) times daily.     [provider]  diphenhydramine-acetaminophen (TYLENOL PM) 25-500 MG TABS tablet Take 1 tablet by mouth at bedtime as needed (sleep).    [provider]  ondansetron (ZOFRAN) 4 MG tablet Take 1 tablet (4 mg total) by mouth every 6 (six) hours as needed for nausea. 07/05/18   Aline August, MD  SENNA PLUS 8.6-50 MG tablet Take 2 tablets by mouth daily. 10/28/18   [provider]    Allergies Metformin and related, Tramadol hcl, Codeine, Gabapentin, Morphine sulfate, Other, Ticlopidine hcl, Warfarin sodium, Xarelto [rivaroxaban], Ace inhibitors, and Tramadol  Family History  Problem Relation Age of Onset  . Heart attack Mother   . Lung cancer Sister   . Prostate cancer Neg Hx   . Kidney cancer Neg Hx     Social History Social History   Tobacco Use  . Smoking status: Former Smoker    Packs/day: 3.00    Years: 60.00    Pack years: 180.00    Types: Cigarettes    Quit date: 07/12/2005    Years since quitting: 13.3  . Smokeless tobacco: Never Used  Substance Use Topics  . Alcohol  use: No    Alcohol/week: 0.0 standard drinks  . Drug use: No    Review of Systems Constitutional: No fever/chills Eyes: No visual changes. ENT: No sore throat. No stiff neck no neck pain Cardiovascular: Denies chest pain. Respiratory: Denies shortness of breath. Gastrointestinal:   no vomiting.  No diarrhea.  No constipation. Genitourinary: Negative for dysuria. Musculoskeletal: Negative lower extremity swelling Skin: Negative for rash. Neurological: Negative for severe headaches, focal weakness or numbness.   ____________________________________________   PHYSICAL EXAM:  VITAL SIGNS: ED Triage Vitals  Enc Vitals Group     BP 11/06/18 1248 (!) 154/66     Pulse Rate 11/06/18 1248 96     Resp 11/06/18 1248 (!) 21     Temp 11/06/18 1248 98.9 F (37.2 C)     Temp Source 11/06/18 1248 Oral     SpO2 11/06/18 1248 96 %     Weight 11/06/18 1251 173 lb 1 oz (78.5 kg)     Height --      Head Circumference --      Peak Flow --      Pain Score 11/06/18 1251 0     Pain Loc --      Pain Edu? --      Excl. in [place]? --     Constitutional: Alert and oriented. Well appearing and in no acute distress. Eyes: Conjunctivae are normal Head: Atraumatic HEENT: No congestion/rhinnorhea. Mucous membranes are moist.  Oropharynx non-erythematous Neck:   Nontender with no meningismus, no masses, no stridor Cardiovascular: Normal rate, regular rhythm. Grossly normal heart sounds.  Good peripheral circulation. Respiratory: Normal respiratory effort.  No retractions. Lungs menaced in the bases Abdominal: Soft and nontender. No distention. No guarding no rebound Back:  There is no focal tenderness or step off.  there is no midline tenderness there are no lesions noted.  Musculoskeletal: No lower extremity tenderness, no upper extremity tenderness. No joint effusions, no DVT signs strong distal pulses no edema Neurologic:  Normal speech and language. No gross focal neurologic deficits are  appreciated.  Skin:  Skin is warm, dry and intact. No rash noted. Psychiatric: Mood and affect are normal. Speech and behavior are normal.  ____________________________________________   LABS (all labs ordered are listed, but only abnormal results are displayed)  Labs Reviewed  BASIC METABOLIC PANEL - Abnormal; Notable for the  following components:      Result Value   Chloride 94 (*)    Glucose, Bld 391 (*)    BUN 48 (*)    Creatinine, Ser 2.23 (*)    GFR calc non Af Amer 25 (*)    GFR calc Af Amer 29 (*)    All other components within normal limits  CBC - Abnormal; Notable for the following components:   RBC 3.39 (*)    Hemoglobin 10.3 (*)    HCT 32.7 (*)    All other components within normal limits  URINALYSIS, COMPLETE (UACMP) WITH MICROSCOPIC    Pertinent labs  results that were available during my care of the patient were reviewed by me and considered in my medical decision making (see chart for details). ____________________________________________  EKG  I personally interpreted any EKGs ordered by me or triage  ____________________________________________  RADIOLOGY  Pertinent labs & imaging results that were available during my care of the patient were reviewed by me and considered in my medical decision making (see chart for details). If possible, patient and/or family made aware of any abnormal findings.  No results found. ____________________________________________    PROCEDURES  Procedure(s) performed: None  Procedures  Critical Care performed: None  ____________________________________________   INITIAL IMPRESSION / ASSESSMENT AND PLAN / ED COURSE  Pertinent labs & imaging results that were available during my care of the patient were reviewed by me and considered in my medical decision making (see chart for details).   Patient here for elevated blood sugar, is is 391 at this time which is well within the normal range for this patient, who  usually is in the 300s when he comes in.  Does look a little dehydrated and has had increased polyuria we will give him a small bolus, with towards his history of CHF, of IV fluid.  This time there is no stigmata of CHF fortunately.  He is doing well on his home oxygen.  He may need a progression of his care towards insulin.  We will talk to his hospice nurses and see what they would like us to do.  ----------------------------------------- 4:39 PM on 11/06/2018 -----------------------------------------  Patient is continue to be in no acute distress.  Has been evaluated by hospice they will continue to evaluate him at home.  Blood sugar is trending down I did give him insulin.  There is no acute indication for admission except for slightly elevated creatinine which hospice will check as an outpatient.  Their strong preference is to get him home and so is his.  Given this, we will discharge him to his place of comfort return precautions follow-up given understood.    ____________________________________________   FINAL CLINICAL IMPRESSION(S) / ED DIAGNOSES  Final diagnoses:  None      This chart was dictated using voice recognition software.  Despite best efforts to proofread,  errors can occur which can change meaning.      Jeanmarie PlantMcShane, James A, MD 11/06/18 1424    Jeanmarie PlantMcShane, James A, MD 11/06/18 1640

## 2018-11-06 NOTE — ED Notes (Signed)
[facility]  Timothy Cordova

## 2018-11-06 NOTE — ED Triage Notes (Addendum)
Pt to ED with hyperglycemia. CBG at home was 501. EMS administered 200cc NS and most recent CBG was 420. Pt is currently taking prednisone for increased SOB. Per EMS pt took multiple breathing treatments prior to their arrival. PT was able to ambulate per EMS but his color became grey and he had noted increase WOB. Pt on chronic 4L via [place] with hx of COPD and CHF. Pt is a hospice pt and DNR placed with pts chart.   Pts urine collected yesterday to test for a UTI. Unknown results.

## 2018-11-06 NOTE — Telephone Encounter (Signed)
Mitzi called back and pt has slurred speech (Mitzi said pt had slurred speech which he had with UTI previously) and pt BS was 562. Dr Lorelei Pont said pt needs to go to ED for eval. Mitzi voiced understanding and will let family know; she is not sure pt will go to ED; advised with symptoms and 562 blood sugar he needs to go to ED.Mitzi voiced understanding.FYI to Dr Lorelei Pont

## 2018-11-06 NOTE — Telephone Encounter (Signed)
Mitzi nurse with [facility] left v/m ;that pt is having confusion and weakness; last weekend pt fell x 2. Last time pt had these symptoms had UTI. Mitzi request order to get urine specimen to run U/A and culture.Please advise.

## 2018-11-06 NOTE — ED Notes (Addendum)
PT ready for discharge, IV removed, EMS called for transport.  PT given d/c paper and DNR form.  Dispatch states there are five ahead of pt for transport.  Will continue to monitor pt until EMS arrives.

## 2018-11-07 LAB — URINE CULTURE

## 2018-11-07 NOTE — Telephone Encounter (Signed)
Can we order:    A repeat basic metabolic panel. Dx: chronic kidney disease stage 3

## 2018-11-07 NOTE — Telephone Encounter (Signed)
Mitzi RN with [facility] called back;pt did go to ED and is back at home now. Per Mitzi pt did not have UTI; electrolytes were off and pt was given fluids at hospital;BS was in 300's. ED suggested pt should be started on insulin. Mitzi said she does not think pts wife could work with insulin; pt's wife has vision problems. Mitzi request Dr Lorelei Pont adjust oral diabetic meds instead. Pt is on prednisone and Mitzi asked wife to ck FBS daily. Mitzi request cb after Dr Lorelei Pont reviews.

## 2018-11-07 NOTE — Telephone Encounter (Signed)
I agree with increasing oral meds  I want him to increase his glipizide to 2 tablets twice a day. (equaling 20 mg twice a day)  Lelon Frohlich or [name] can call me in about a week to adjust meds, but I do want a fasting BS daily to be recorded

## 2018-11-07 NOTE — Telephone Encounter (Signed)
Mitzi with [facility] notified as instructed by telephone.  She is also inquiring about lab work.  She states the doctors in the ER mentioned something to Mr. Feutz about needing lab work done but she needs order on what needs to be drawn.  Please advise.

## 2018-11-07 NOTE — Telephone Encounter (Signed)
Mitzi called back and left a message on triage line asking for some clarification about the patient. Please call he back at [phone]

## 2018-11-07 NOTE — Telephone Encounter (Signed)
Mitzi notified by telephone to draw at [facility] at next home visit per Dr. Lorelei Pont.

## 2018-11-08 DIAGNOSIS — J9601 Acute respiratory failure with hypoxia: Secondary | ICD-10-CM | POA: Diagnosis not present

## 2018-11-08 DIAGNOSIS — J449 Chronic obstructive pulmonary disease, unspecified: Secondary | ICD-10-CM | POA: Diagnosis not present

## 2018-11-08 DIAGNOSIS — I272 Pulmonary hypertension, unspecified: Secondary | ICD-10-CM | POA: Diagnosis not present

## 2018-11-08 DIAGNOSIS — I5032 Chronic diastolic (congestive) heart failure: Secondary | ICD-10-CM | POA: Diagnosis not present

## 2018-11-09 ENCOUNTER — Encounter: Payer: Self-pay | Admitting: Family Medicine

## 2018-11-11 ENCOUNTER — Encounter: Payer: Self-pay | Admitting: Family Medicine

## 2018-11-11 ENCOUNTER — Ambulatory Visit (INDEPENDENT_AMBULATORY_CARE_PROVIDER_SITE_OTHER): Payer: Medicare PPO | Admitting: Family Medicine

## 2018-11-11 VITALS — BP 121/99 | HR 104 | Ht 66.0 in | Wt 183.0 lb

## 2018-11-11 DIAGNOSIS — E1165 Type 2 diabetes mellitus with hyperglycemia: Secondary | ICD-10-CM

## 2018-11-11 DIAGNOSIS — J9611 Chronic respiratory failure with hypoxia: Secondary | ICD-10-CM | POA: Diagnosis not present

## 2018-11-11 NOTE — Progress Notes (Signed)
Brittanee Ghazarian T. Geeta Dworkin, MD Primary Care and [place] at [facility] [place], [address] Phone: [phone]  FAX: [place] - 85 y.o. male  MRN [id number]  Date of Birth: 1929-01-26  Visit Date: 11/11/2018  PCP: Owens Loffler, MD  Referred by: Owens Loffler, MD Chief Complaint  Patient presents with  . Follow-up    [facility] ER Visit-Hyperglycemia   Virtual Visit via Video Note:  I connected with  Franklyn Lor Sr. on 11/11/2018 11:40 AM EDT by a video enabled telemedicine application and verified that I am speaking with the correct person using two identifiers.   Location patient: home computer, tablet, or smartphone Location provider: work or home office Consent: Verbal consent directly obtained from [place].. Persons participating in the virtual visit: patient, provider  I discussed the limitations of evaluation and management by telemedicine and the availability of in person appointments. The patient expressed understanding and agreed to proceed.  Interactive audio and video telecommunications were attempted between this provider and patient, however failed, due to patient having technical difficulties OR patient did not have access to video capability.  We continued and completed visit with audio only.    History of Present Illness:  F/u BS > 600. Given insulin in the ER. He is taking prednisone 10 mg a day, and he has stayed out of the hospital doing this.  He is currently on hospice.   Reports compliance with nebs and wearing his o2.  F/u BS 500 after maximizing Glipizide.  He is on hospice.   Review of Systems as above: See pertinent positives and pertinent negatives per HPI No acute distress verbally  Past Medical History, Surgical History, Social History, Family History, Problem List, Medications, and Allergies have been reviewed and updated if relevant.    Observations/Objective/Exam:  An attempt was made to discern vital signs over the phone and per patient if applicable and possible.   General:    Alert, Oriented, appears well and in no acute distress HEENT:     Atraumatic, conjunctiva clear, no obvious abnormalities on inspection of external nose and ears.  Neck:    Normal movements of the head and neck Pulmonary:     On inspection no signs of respiratory distress, breathing rate appears normal, no obvious gross SOB, gasping or wheezing Cardiovascular:    No obvious cyanosis Musculoskeletal:    Moves all visible extremities without noticeable abnormality Psych / Neurological:     Pleasant and cooperative, no obvious depression or anxiety, speech and thought processing grossly intact  Assessment and Plan:    ICD-10-CM   1. Type 2 diabetes mellitus with hyperglycemia, without long-term current use of insulin (HCC)  E11.65   2. Chronic respiratory failure with hypoxia (HCC)  J96.11    Drop oral to pred to 5 mg a day and call back to me in 2 weeks and let know how breathing and   I discussed the assessment and treatment plan with the patient. The patient was provided an opportunity to ask questions and all were answered. The patient agreed with the plan and demonstrated an understanding of the instructions.   The patient was advised to call back or seek an in-person evaluation if the symptoms worsen or if the condition fails to improve as anticipated.  Follow-up: prn unless noted otherwise below No follow-ups on file.  No orders of the defined types were placed in this encounter.  No orders of the defined types were placed in this encounter.   Signed,  Maud Deed. Ayiana Winslett, MD

## 2018-11-12 ENCOUNTER — Telehealth: Payer: Self-pay | Admitting: *Deleted

## 2018-11-12 NOTE — Telephone Encounter (Signed)
Mitzi with [facility] left a voicemail stating that she got an order Friday 11/08/18 to increase patient's Glipizide from 10 mg twice a day to 20 mg twice a day. Mitzi stated that the pharmacist at [facility] told her that they got an alert that 20 mg is the maximum dose a day and more is not effective and can cause kidney damage.  Mitzi stated that patient's FBS this morning was 241. Mitzi stating with his Prednisone being reduced from 10 mg to 5 mg should help. Mitzi requested a call back with any medication changes.

## 2018-11-12 NOTE — Telephone Encounter (Signed)
Understook, I think cutting it back   Glipizide 10 mg twice a day should be ok. I think this is all being driven from the steroids, and I am fine seeing how is does on 5 mg only of prednisone

## 2018-11-13 NOTE — Telephone Encounter (Signed)
Timothy Cordova notified by telephone to have Timothy Cordova cut back his glipizide to 10 mg twice a day and prednisone to 5 mg daily per Dr. Lorelei Pont.  Medication list updated.

## 2018-11-13 NOTE — Telephone Encounter (Signed)
I spoke to the hospice nurse a couple of hours ago.  Timothy Cordova is generally not doing all that well.  He is fallen 4 times recently.  Suppose it sugars are still high, but they are doing reasonably better.  At this point per hospice he does want to have comfort care only.  Timothy Cordova from hospice does think that he does have some fluid on 1 of his lungs, he is tachycardic to slightly over 100.  We had a long conversation about this, and really the way that this would be addressed medically would be admission the hospital.  At this point both time and and do not really want to move in that direction.  I think globally he is dwindling, and he is at peace with this from a religious standpoint.

## 2018-11-18 ENCOUNTER — Telehealth: Payer: Self-pay

## 2018-11-18 NOTE — Telephone Encounter (Signed)
[place] Night - Client Nonclinical Telephone Record AccessNurse Client [place] Night - Client Client Site [place] Physician Owens Loffler - MD Contact Type Call Who Is Calling Patient / Member / Family / Caregiver Caller Name Stevens Phone Number [phone] Patient Name Timothy Sites Sr. Patient DOB [birth year] Call Type Message Only Information Provided Reason for Call Request for General Office Information Initial Comment Caller states she needs in home nursing care for her husband. Caller states she needs to know what her insurance will cover and she needs a referral from Dr. Lorelei Pont. Caller states her husband is very ill and she needs help caring for him. Caller declined triage. Additional Comment Office hours provided. Call Closed By: Christain [place] Transaction Date/Time: 11/15/2018 5:31:40 PM (ET)

## 2018-11-18 NOTE — Telephone Encounter (Signed)
I left detailed message (okay per DPR) for Webb Silversmith (patient's wife) returning her call.   At this point, since Hospice is involved, it would be best to contact their social worker and work with their nursing care team to help navigate facility options.  I don't know if Hospice home would be an option or not at this point but would recommend she discuss with them.  I am sure Dr. Lorelei Pont would be able to sign an FL2 for whatever care admission needs patient has but would need Hospice social work team to help family/patient navigate facility options.   Will copy this to [facility] and Dr. Lorelei Pont in case wife calls back today to discuss further.   Thanks.

## 2018-11-18 NOTE — Telephone Encounter (Signed)
I completely agree and hospice can help navigate better than we can

## 2018-11-18 NOTE — Telephone Encounter (Signed)
Can you help and stay involved?  I agree.  I have never seen a case where insurance will provide all day sitters.

## 2018-11-18 NOTE — Telephone Encounter (Signed)
I believe there may be some miscommunication with the insurance company.  Insurance does not pay for in home sitter services.  They will pay for home health but typically not sitter type services.    However, I will investigate this further for patient's wife and I also want to reach out myself to the hospice team for clarity.  I need to collaborate with them to see if we can help the patient and family get the needed support.    I will be unable to make these connections until I am back in my office tomorrow, but will do so at that time.

## 2018-11-18 NOTE — Telephone Encounter (Signed)
Mrs. Goodell called back and she has spoken with hospice and they do not offer someone to come and sit with pt from 9 AM - 4PM everyday. Hospice has a [place] aide that come on Tues, Thur, and Sat to bathe pt and change bed linens. Mrs Coolman said she needs help to take care of pt on daily basis. Now Mrs Loomer says she barely has time to eat a meal because pt is requiring her time. Pt said that the church is helping as much as they can by bringing supper x 3 per week and mowing the yard. pts son works 12 hr shifts and does what he can but he is limited on his time. Mitzi the hospice nurse has talked with pt about going to the hospice home but pt refuses and says he wants to die at home. pts son has already called [facility] and was advised if Dr Lorelei Pont would contact [employer] at [place] will pay for caregiver in the home as long as the caregiver is in network. Mrs Flegel has list of in home care givers that are in network. Mrs Dominic said they cannot afford to pay out of pocket for caregiver.pt is not on medicaid. I called [employer] medicare at number provided and spoke with Santiago Glad ref # [id number]. She cked and said pt is not eligible for coverage of this type service. I let Mrs Mehringer know what Santiago Glad with [employer] advised and she voiced understanding and Mitzi the nurse is to come later today and she will talk with her again. Pt wants note sent to Dr Lorelei Pont to see if he has other suggestions and request cb after Dr Copland's review. Also see note from [facility] about [employer] at [employer].

## 2018-11-19 NOTE — Telephone Encounter (Signed)
I contacted Mitzie (hospice nurse) and left her a message to please call me as soon as possible to help [place] navigate these care concerns/issues for patient and family.    At this point, until speaking with them for resource and conversation support, there is nothing else we can do. Will call again tomorrow if do not hear back from someone today.   Thanks.

## 2018-11-20 NOTE — Telephone Encounter (Signed)
Spoke with Mitzie (hospice nurse) and wife.  Mitzie is at the patient's home now, he is in the active phase of passing and likely will not make it another 24 hours. Mitzie has in place 2 cna's in the home to support them through the next several hours with his passing.   I spoke with wife and extended my and Dr. Serita Grit sorrow and support.  Wife knows she can call and ask for me if she needs anything moving forward.  FYI to Dr. Lorelei Pont.

## 2018-11-20 NOTE — Telephone Encounter (Signed)
Thank-you for your help. [name] and Mrs. Smallman are very special people

## 2018-11-22 ENCOUNTER — Telehealth: Payer: Self-pay

## 2018-11-22 MED ORDER — MORPHINE SULFATE (CONCENTRATE) 20 MG/ML PO SOLN: ORAL | 0 refills | Status: AC

## 2018-11-22 NOTE — Addendum Note (Signed)
Addended by: Owens Loffler on: 11/22/2018 12:22 PM   Modules accepted: Orders

## 2018-11-22 NOTE — Telephone Encounter (Signed)
Mitzi, hospice nurse with [facility], called to request Rx for Morphine  20mg /mL 0.25-0.5 mL q1h PRN for SOB or pain sent to [facility] to contact [place] on her cell at [phone] if further information is needed

## 2018-11-22 NOTE — Telephone Encounter (Signed)
Sent in and called Mitzi

## 2018-12-03 ENCOUNTER — Telehealth: Payer: Self-pay | Admitting: Family Medicine

## 2018-12-03 NOTE — Telephone Encounter (Signed)
[employer] notified as instructed by telephone.

## 2018-12-03 NOTE — Telephone Encounter (Signed)
[employer] is calling to check the status of death certificate they dropped of for the patient   C/B # [phone]

## 2018-12-03 NOTE — Telephone Encounter (Signed)
Oh no, I completely forgot in my rush to leave yesterday.  I will do first thing in the AM

## 2018-12-04 NOTE — Telephone Encounter (Signed)
completed

## 2018-12-10 DEATH — deceased

## 2018-12-18 ENCOUNTER — Other Ambulatory Visit: Payer: Self-pay | Admitting: Family Medicine
